# Patient Record
Sex: Male | Born: 1990 | Race: Black or African American | Hispanic: No | Marital: Single | Smoking: Current every day smoker
Health system: Southern US, Community
[De-identification: ages and names within clinical notes are randomized; demographics above are authoritative.]

## PROBLEM LIST (undated history)

## (undated) DIAGNOSIS — J45909 Unspecified asthma, uncomplicated: Secondary | ICD-10-CM

## (undated) DIAGNOSIS — F329 Major depressive disorder, single episode, unspecified: Secondary | ICD-10-CM

## (undated) DIAGNOSIS — F209 Schizophrenia, unspecified: Secondary | ICD-10-CM

## (undated) DIAGNOSIS — Z1589 Genetic susceptibility to other disease: Secondary | ICD-10-CM

## (undated) DIAGNOSIS — F32A Depression, unspecified: Secondary | ICD-10-CM

## (undated) DIAGNOSIS — F319 Bipolar disorder, unspecified: Secondary | ICD-10-CM

---
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
## 2011-04-21 DIAGNOSIS — F1220 Cannabis dependence, uncomplicated: Secondary | ICD-10-CM | POA: Diagnosis present

## 2011-04-21 DIAGNOSIS — F17200 Nicotine dependence, unspecified, uncomplicated: Secondary | ICD-10-CM | POA: Diagnosis present

## 2018-08-02 ENCOUNTER — Encounter: Payer: Self-pay | Admitting: *Deleted

## 2018-08-02 ENCOUNTER — Other Ambulatory Visit: Payer: Self-pay

## 2018-08-02 ENCOUNTER — Emergency Department
Admission: EM | Admit: 2018-08-02 | Discharge: 2018-08-03 | Disposition: A | Discharge status: Other Acute Inpatient Hospital | Payer: Medicare PPO | Attending: Emergency Medicine | Admitting: Emergency Medicine

## 2018-08-02 DIAGNOSIS — Z03818 Encounter for observation for suspected exposure to other biological agents ruled out: Secondary | ICD-10-CM | POA: Insufficient documentation

## 2018-08-02 DIAGNOSIS — F919 Conduct disorder, unspecified: Secondary | ICD-10-CM | POA: Diagnosis present

## 2018-08-02 DIAGNOSIS — J45909 Unspecified asthma, uncomplicated: Secondary | ICD-10-CM | POA: Insufficient documentation

## 2018-08-02 DIAGNOSIS — F201 Disorganized schizophrenia: Secondary | ICD-10-CM | POA: Diagnosis present

## 2018-08-02 DIAGNOSIS — R45850 Homicidal ideations: Secondary | ICD-10-CM | POA: Insufficient documentation

## 2018-08-02 DIAGNOSIS — Z046 Encounter for general psychiatric examination, requested by authority: Secondary | ICD-10-CM | POA: Insufficient documentation

## 2018-08-02 DIAGNOSIS — R440 Auditory hallucinations: Secondary | ICD-10-CM | POA: Diagnosis not present

## 2018-08-02 DIAGNOSIS — R45851 Suicidal ideations: Secondary | ICD-10-CM | POA: Insufficient documentation

## 2018-08-02 DIAGNOSIS — F209 Schizophrenia, unspecified: Secondary | ICD-10-CM | POA: Diagnosis not present

## 2018-08-02 HISTORY — DX: Bipolar disorder, unspecified: F31.9

## 2018-08-02 HISTORY — DX: Unspecified asthma, uncomplicated: J45.909

## 2018-08-02 HISTORY — DX: Genetic susceptibility to other disease: Z15.89

## 2018-08-02 HISTORY — DX: Depression, unspecified: F32.A

## 2018-08-02 LAB — CBC
HCT: 43.8 % (ref 39.0–52.0)
Hemoglobin: 14.6 g/dL (ref 13.0–17.0)
MCH: 29.2 pg (ref 26.0–34.0)
MCHC: 33.3 g/dL (ref 30.0–36.0)
MCV: 87.6 fL (ref 80.0–100.0)
Platelets: 382 10*3/uL (ref 150–400)
RBC: 5 MIL/uL (ref 4.22–5.81)
RDW: 12.5 % (ref 11.5–15.5)
WBC: 13.1 10*3/uL — ABNORMAL HIGH (ref 4.0–10.5)
nRBC: 0 % (ref 0.0–0.2)

## 2018-08-02 LAB — ETHANOL: Alcohol, Ethyl (B): <10 mg/dL (ref <10)

## 2018-08-02 LAB — COMPREHENSIVE METABOLIC PANEL
ALT: 13 U/L (ref 0–44)
AST: 19 U/L (ref 15–41)
Albumin: 4.5 g/dL (ref 3.5–5.0)
Alkaline Phosphatase: 68 U/L (ref 38–126)
Anion gap: 13 (ref 5–15)
BUN: 9 mg/dL (ref 6–20)
CO2: 23 mmol/L (ref 22–32)
Calcium: 9 mg/dL (ref 8.9–10.3)
Chloride: 99 mmol/L (ref 98–111)
Creatinine, Ser: 0.7 mg/dL (ref 0.61–1.24)
GFR calc Af Amer: >60 mL/min (ref >60)
GFR calc non Af Amer: >60 mL/min (ref >60)
Glucose, Bld: 95 mg/dL (ref 70–99)
Potassium: 3.5 mmol/L (ref 3.5–5.1)
Sodium: 135 mmol/L (ref 135–145)
Total Bilirubin: 0.7 mg/dL (ref 0.3–1.2)
Total Protein: 7.7 g/dL (ref 6.5–8.1)

## 2018-08-02 LAB — URINE DRUG SCREEN, QUALITATIVE (ARMC ONLY)
Amphetamines, Ur Screen: NOT DETECTED
Barbiturates, Ur Screen: NOT DETECTED
Benzodiazepine, Ur Scrn: NOT DETECTED
Cannabinoid 50 Ng, Ur ~~LOC~~: POSITIVE — AB
Cocaine Metabolite,Ur ~~LOC~~: NOT DETECTED
MDMA (Ecstasy)Ur Screen: NOT DETECTED
Methadone Scn, Ur: NOT DETECTED
Opiate, Ur Screen: NOT DETECTED
Phencyclidine (PCP) Ur S: NOT DETECTED
Tricyclic, Ur Screen: NOT DETECTED

## 2018-08-02 LAB — ACETAMINOPHEN LEVEL: Acetaminophen (Tylenol), Serum: <10 ug/mL — ABNORMAL LOW (ref 10–30)

## 2018-08-02 LAB — SALICYLATE LEVEL: Salicylate Lvl: <7 mg/dL (ref 2.8–30.0)

## 2018-08-02 LAB — SARS CORONAVIRUS 2 BY RT PCR (HOSPITAL ORDER, PERFORMED IN ~~LOC~~ HOSPITAL LAB): SARS Coronavirus 2: NEGATIVE

## 2018-08-02 MED ORDER — ACETAMINOPHEN 325 MG PO TABS
650.0000 mg | ORAL_TABLET | Freq: Once | ORAL | Status: AC
  Administered 2018-08-02: 650 mg via ORAL
  Filled 2018-08-02: qty 2

## 2018-08-02 NOTE — ED Provider Notes (Signed)
[facility] Emergency Department Provider Note  ____________________________________________   I have reviewed the triage vital signs and the nursing notes.   HISTORY  Chief Complaint Behavior Problem   History limited by and level 5 caveat due to: mental illness   HPI Kenneth Jensen is a 28 y.o. male who presents to the emergency department today because of concern for schizophrenia. He has a hard time describing exactly what has been going on with him recently. He does state that he feels he is not getting the help he needs at home. The patient says that he feels like he needs help. Does say that he gets shots for his schizophrenia. He denies any recent medical complaints.   Records reviewed. Per medical record review patient has a history of mental illness.   Past Medical History:  Diagnosis Date  . Asthma   . Bipolar 1 disorder (HCC)   . Depression   . Schizotaxia     There are no active problems to display for this patient.   History reviewed. No pertinent surgical history.  Prior to Admission medications   Not on File    Allergies Patient has no allergy information on record.  History reviewed. No pertinent family history.  Social History Social History   Tobacco Use  . Smoking status: Never Smoker  . Smokeless tobacco: Never Used  Substance Use Topics  . Alcohol use: Yes    Comment: socially  . Drug use: Yes    Types: Marijuana    Review of Systems Constitutional: No fever/chills Eyes: No visual changes. ENT: No sore throat. Cardiovascular: Denies chest pain. Respiratory: Denies shortness of breath. Gastrointestinal: No abdominal pain.  No nausea, no vomiting.  No diarrhea.   Genitourinary: Negative for dysuria. Musculoskeletal: Negative for back pain. Skin: Negative for rash. Neurological: Negative for headaches, focal weakness or numbness.  ____________________________________________   PHYSICAL EXAM:  VITAL  SIGNS: ED Triage Vitals  Enc Vitals Group     BP 08/02/18 1834 (!) 121/54     Pulse Rate 08/02/18 1834 94     Resp 08/02/18 1834 16     Temp 08/02/18 1834 98.3 F (36.8 C)     Temp Source 08/02/18 1834 Oral     SpO2 08/02/18 1834 98 %     Weight --      Height 08/02/18 1836 5\' 8"  (1.727 m)     Head Circumference --      Peak Flow --      Pain Score 08/02/18 1836 9   Constitutional: Alert and oriented.  Eyes: Conjunctivae are normal.  ENT      Head: Normocephalic and atraumatic.      Nose: No congestion/rhinnorhea.      Mouth/Throat: Mucous membranes are moist.      Neck: No stridor. Hematological/Lymphatic/Immunilogical: No cervical lymphadenopathy. Cardiovascular: Normal rate, regular rhythm.  No murmurs, rubs, or gallops.  Respiratory: Normal respiratory effort without tachypnea nor retractions. Breath sounds are clear and equal bilaterally. No wheezes/rales/rhonchi. Gastrointestinal: Soft and non tender. No rebound. No guarding.  Genitourinary: Deferred Musculoskeletal: Normal range of motion in all extremities. No lower extremity edema. Neurologic:  Normal speech and language. No gross focal neurologic deficits are appreciated.  Skin:  Skin is warm, dry and intact. No rash noted. Psychiatric: Mood and affect are normal. Speech and behavior are normal. Patient exhibits appropriate insight and judgment.  ____________________________________________    LABS (pertinent positives/negatives)  Ethanol <10 CMP wnl CBC wbc 13.1, hgb 14.6, plt  382 UDS cannabinoid positive Salicylate and acetaminophen below threshold ____________________________________________   EKG  None  ____________________________________________    RADIOLOGY  None  ____________________________________________   PROCEDURES  Procedures  ____________________________________________   INITIAL IMPRESSION / ASSESSMENT AND PLAN / ED COURSE  Pertinent labs & imaging results that were  available during my care of the patient were reviewed by me and considered in my medical decision making (see chart for details).   Patient with schizophrenia here in apparent attempt to seek help. Patient does not appear to be responding to any internal stimuli. Was evaluated by psychiatry who will plan on admission.  ___________________________________________   FINAL CLINICAL IMPRESSION(S) / ED DIAGNOSES  Final diagnoses:  Schizophrenia, unspecified type ([place])     Note: This dictation was prepared with Dragon dictation. Any transcriptional errors that result from this process are unintentional     [name], MD 08/02/18 2035

## 2018-08-02 NOTE — ED Notes (Signed)
Pt. Alert and oriented, warm and dry, in no distress. Pt. States he maybe have SI and would not answer completely when asked if he has a plan. Patient states he was having some HI but not now due to not being around those people. Patient is having AH with one voice telling him to be calm and the other to kill himself. Patient states he does not take medication but he smokes weed. Pt. Encouraged to let nursing staff know of any concerns or needs.

## 2018-08-02 NOTE — ED Triage Notes (Signed)
Pt with a hx of schizophrenia and reports he is hearing voices that are threatening him. Pt reporting SI with one attempt at walking out in front of traffic but he moved off the road just before the car came. Pt denies HI. Pt talking about his family but his comments are not making sense to this RN. Marijuana use but no other drug or alcohol use. Difficulty sleeping. Pt not clear at this time if he is complient with medications.

## 2018-08-02 NOTE — Consult Note (Signed)
[facility] Face-to-Face Psychiatry Consult   Reason for Consult: Auditory hallucinations Referring Physician: Dr. Derrill KayGoodman Patient Identification: Kenneth Jensen MRN:  [id number] Principal Diagnosis: <principal problem not specified> Diagnosis:  Active Problems:   Schizophrenia (HCC)   Total Time spent with patient: 45 minutes  Subjective: " Do you think when I leave here I can get my own apartment so I can live on my own?" Kenneth Jensen is a 28 y.o. male patient [facility] to [facility] ED for law enforcement voluntarily. The patient states "I came into the hospital because I need help for my schizophrenia.  The patient continued to disclose that he lives with his cousin but he feels the living condition is not right for him.Marland Kitchen.  He states, "my cousin kids are bad."  He continues to voice "can your help me to get into my own room apartment?" The patient currently is not taking any oral psychiatric medications.  He did voice that his medications comes from [facility] [place]. The address is [address], [place], [place] [address] and phone number is [phone]. This provider spoke to [place] in the pharmacy department and she stated she will have the pharmacy tech called [facility] in the morning to get the patient updated medication reconciliation list The patient endorses, receiving monthly injections from a doctor he is unsure of the doctors name.  He voice he gets a monthly Zyprexa injection, which it could be that he is receiving Zyprexa Relprevv. The patient was seen face-to-face by this provider; chart reviewed and consulted with Dr. Derrill KayGoodman on 08/02/2018 due to the care of the patient. It was discussed with the provider that the patient does hallucinating and meet criteria to be admitted to the psychiatric inpatient unit.  On evaluation the patient is alert and oriented x3, calm, cooperative, and mood is congruent with affect. The patient does admit to hearing voices and states  the voices "it tells me some things and sometimes other things." The patient does not appear to be responding to internal or external stimuli. The patient is presenting with some delusional thinking. The patient admits to auditory and visual hallucinations. The patient admits to having suicidal thoughts but denies homicidal, or self-harm ideations. The patient is presenting with psychotic behaviors. During an encounter with the patient, he was able to answer most questions appropriately. Collateral was obtained from the patient uncle Mr. Lonia Skinneratrick Celona [phone] who expresses concerns about the patient voicing he wants to live independently.  Mr. Francesco RunnerHolden expressed concerns stating that he is aware that the patient current living condition is not adequate at this time, but he is unable to have the patient come and live with him on the full-time basis.  He voiced, the patient is well come to spend weekends at his home.   Plan: The patient is a safety risk to self and does require psychiatric inpatient admission for stabilization and treatment.  HPI: Per Dr. Derrill KayGoodman: Kenneth Jensen is a 28 y.o. male who presents to the emergency department today because of concern for schizophrenia. He has a hard time describing exactly what has been going on with him recently. He does state that he feels he is not getting the help he needs at home. The patient says that he feels like he needs help. Does say that he gets shots for his schizophrenia. He denies any recent medical complaints.   Records reviewed. Per medical record review patient has a history of mental illness.   Past Psychiatric  History:  Bipolar 1 disorder (HCC) Depression Schizotaxia  Risk to Self:  Yes Risk to Others:  No Prior Inpatient Therapy:  Yes Prior Outpatient Therapy:  Yes  Past Medical History:  Past Medical History:  Diagnosis Date  . Asthma   . Bipolar 1 disorder (HCC)   . Depression   . Schizotaxia    History reviewed.  No pertinent surgical history. Family History: History reviewed. No pertinent family history. Family Psychiatric  History: Social History:  Social History   Substance and Sexual Activity  Alcohol Use Yes   Comment: socially     Social History   Substance and Sexual Activity  Drug Use Yes  . Types: Marijuana    Social History   Socioeconomic History  . Marital status: Unknown    Spouse name: Not on file  . Number of children: Not on file  . Years of education: Not on file  . Highest education level: Not on file  Occupational History  . Not on file  Social Needs  . Financial resource strain: Not on file  . Food insecurity    Worry: Not on file    Inability: Not on file  . Transportation needs    Medical: Not on file    Non-medical: Not on file  Tobacco Use  . Smoking status: Never Smoker  . Smokeless tobacco: Never Used  Substance and Sexual Activity  . Alcohol use: Yes    Comment: socially  . Drug use: Yes    Types: Marijuana  . Sexual activity: Not on file  Lifestyle  . Physical activity    Days per week: Not on file    Minutes per session: Not on file  . Stress: Not on file  Relationships  . Social Musicianconnections    Talks on phone: Not on file    Gets together: Not on file    Attends religious service: Not on file    Active member of club or organization: Not on file    Attends meetings of clubs or organizations: Not on file    Relationship status: Not on file  Other Topics Concern  . Not on file  Social History Narrative  . Not on file   Additional Social History:    Allergies:  Not on File  Labs:  Results for orders placed or performed during the hospital encounter of 08/02/18 (from the past 48 hour(s))  Comprehensive metabolic panel     Status: None   Collection Time: 08/02/18  6:44 PM  Result Value Ref Range   Sodium 135 135 - 145 mmol/L   Potassium 3.5 3.5 - 5.1 mmol/L   Chloride 99 98 - 111 mmol/L   CO2 23 22 - 32 mmol/L   Glucose, Bld 95  70 - 99 mg/dL   BUN 9 6 - 20 mg/dL   Creatinine, Ser 1.300.70 0.61 - 1.24 mg/dL   Calcium 9.0 8.9 - 86.510.3 mg/dL   Total Protein 7.7 6.5 - 8.1 g/dL   Albumin 4.5 3.5 - 5.0 g/dL   AST 19 15 - 41 U/L   ALT 13 0 - 44 U/L   Alkaline Phosphatase 68 38 - 126 U/L   Total Bilirubin 0.7 0.3 - 1.2 mg/dL   GFR calc non Af Amer >60 >60 mL/min   GFR calc Af Amer >60 >60 mL/min   Anion gap 13 5 - 15    Comment: Performed at [facility], [address] [place], [place] [address]  Ethanol  Status: None   Collection Time: 08/02/18  6:44 PM  Result Value Ref Range   Alcohol, Ethyl (B) <10 <10 mg/dL    Comment: (NOTE) Lowest detectable limit for serum alcohol is 10 mg/dL. For medical purposes only. Performed at [facility], [address] [place], [place] [address]   Salicylate level     Status: None   Collection Time: 08/02/18  6:44 PM  Result Value Ref Range   Salicylate Lvl <1.7 2.8 - 30.0 mg/dL    Comment: Performed at [facility], [address] [place], [place] [address]  Acetaminophen level     Status: Abnormal   Collection Time: 08/02/18  6:44 PM  Result Value Ref Range   Acetaminophen (Tylenol), Serum <10 (L) 10 - 30 ug/mL    Comment: (NOTE) Therapeutic concentrations vary significantly. A range of 10-30 ug/mL  may be an effective concentration for many patients. However, some  are best treated at concentrations outside of this range. Acetaminophen concentrations >150 ug/mL at 4 hours after ingestion  and >50 ug/mL at 12 hours after ingestion are often associated with  toxic reactions. Performed at [facility], [address] [place], [place] [address]   cbc     Status: Abnormal   Collection Time: 08/02/18  6:44 PM  Result Value Ref Range   WBC 13.1 (H) 4.0 - 10.5 K/uL   RBC 5.00 4.22 - 5.81 MIL/uL   Hemoglobin 14.6 13.0 - 17.0 g/dL   HCT 43.8 39.0 - 52.0 %   MCV 87.6 80.0 - 100.0 fL   MCH 29.2 26.0 - 34.0 pg   MCHC 33.3 30.0 -  36.0 g/dL   RDW 12.5 11.5 - 15.5 %   Platelets 382 150 - 400 K/uL   nRBC 0.0 0.0 - 0.2 %    Comment: Performed at [facility], [address], [place], [place] [address]  Urine Drug Screen, Qualitative     Status: Abnormal   Collection Time: 08/02/18  6:44 PM  Result Value Ref Range   Tricyclic, Ur Screen NONE DETECTED NONE DETECTED   Amphetamines, Ur Screen NONE DETECTED NONE DETECTED   MDMA (Ecstasy)Ur Screen NONE DETECTED NONE DETECTED   Cocaine Metabolite,Ur [place] NONE DETECTED NONE DETECTED   Opiate, Ur Screen NONE DETECTED NONE DETECTED   Phencyclidine (PCP) Ur S NONE DETECTED NONE DETECTED   Cannabinoid 50 Ng, Ur [place] POSITIVE (A) NONE DETECTED   Barbiturates, Ur Screen NONE DETECTED NONE DETECTED   Benzodiazepine, Ur Scrn NONE DETECTED NONE DETECTED   Methadone Scn, Ur NONE DETECTED NONE DETECTED    Comment: (NOTE) Tricyclics + metabolites, urine    Cutoff 1000 ng/mL Amphetamines + metabolites, urine  Cutoff 1000 ng/mL MDMA (Ecstasy), urine              Cutoff 500 ng/mL Cocaine Metabolite, urine          Cutoff 300 ng/mL Opiate + metabolites, urine        Cutoff 300 ng/mL Phencyclidine (PCP), urine         Cutoff 25 ng/mL Cannabinoid, urine                 Cutoff 50 ng/mL Barbiturates + metabolites, urine  Cutoff 200 ng/mL Benzodiazepine, urine              Cutoff 200 ng/mL Methadone, urine                   Cutoff 300 ng/mL The urine  drug screen provides only a preliminary, unconfirmed analytical test result and should not be used for non-medical purposes. Clinical consideration and professional judgment should be applied to any positive drug screen result due to possible interfering substances. A more specific alternate chemical method must be used in order to obtain a confirmed analytical result. Gas chromatography / mass spectrometry (GC/MS) is the preferred confirmat ory method. Performed at [facility], [address] [place], [place] [address]      No current facility-administered medications for this encounter.    No current outpatient medications on file.    Musculoskeletal: Strength & Muscle Tone: within normal limits Gait & Station: unsteady Patient leans: N/A  Psychiatric Specialty Exam: Physical Exam  Nursing note and vitals reviewed. Constitutional: He is oriented to person, place, and time. He appears well-developed and well-nourished.  HENT:  Head: Normocephalic.  Eyes: Pupils are equal, round, and reactive to light. Conjunctivae are normal.  Neck: Normal range of motion. Neck supple.  Respiratory: Effort normal.  Musculoskeletal: Normal range of motion.  Neurological: He is alert and oriented to person, place, and time.  Skin: Skin is warm and dry.    Review of Systems  Psychiatric/Behavioral: Positive for depression, hallucinations and suicidal ideas. The patient is nervous/anxious.   All other systems reviewed and are negative.   Blood pressure (!) 121/54, pulse 94, temperature 98.3 F (36.8 C), temperature source Oral, resp. rate 16, height 5\' 8"  (1.727 m), SpO2 98 %.There is no height or weight on file to calculate BMI.  General Appearance: Disheveled  Eye Contact:  Good  Speech:  Slow  Volume:  Decreased  Mood:  Anxious and Euphoric  Affect:  Congruent and Depressed  Thought Process:  Disorganized  Orientation:  Full (Time, Place, and Person)  Thought Content:  Illogical and Delusions  Suicidal Thoughts:  Yes.  without intent/plan  Homicidal Thoughts:  No  Memory:  Immediate;   Fair Recent;   Fair  Judgement:  Poor  Insight:  Lacking  Psychomotor Activity:  Decreased  Concentration:  Concentration: Fair and Attention Span: Good  Recall:  Good  Fund of Knowledge:  Fair  Language:  Fair  Akathisia:  Negative  Handed:  Right  AIMS (if indicated):     Assets:  Desire for Improvement Financial Resources/Insurance Housing Social Support  ADL's:  Intact  Cognition:  Impaired,  Mild  Sleep:    Okay     Treatment Plan Summary: Daily contact with patient to assess and evaluate symptoms and progress in treatment and Medication management  Disposition: Recommend psychiatric Inpatient admission when medically cleared. Supportive therapy provided about ongoing stressors.  Gillermo MurdochJacqueline Ameia Morency, NP 08/02/2018 10:27 PM

## 2018-08-03 ENCOUNTER — Inpatient Hospital Stay
Admission: AD | Admit: 2018-08-03 | Discharge: 2018-08-04 | DRG: 885 | Disposition: A | Discharge status: Home / Self Care | Payer: Medicare PPO | Source: Intra-hospital | Attending: Psychiatry | Admitting: Psychiatry

## 2018-08-03 DIAGNOSIS — Z1159 Encounter for screening for other viral diseases: Secondary | ICD-10-CM

## 2018-08-03 DIAGNOSIS — F201 Disorganized schizophrenia: Secondary | ICD-10-CM | POA: Diagnosis present

## 2018-08-03 DIAGNOSIS — F319 Bipolar disorder, unspecified: Secondary | ICD-10-CM | POA: Diagnosis present

## 2018-08-03 DIAGNOSIS — F203 Undifferentiated schizophrenia: Secondary | ICD-10-CM

## 2018-08-03 DIAGNOSIS — F209 Schizophrenia, unspecified: Secondary | ICD-10-CM | POA: Diagnosis present

## 2018-08-03 MED ORDER — MAGNESIUM HYDROXIDE 400 MG/5ML PO SUSP: 30.0000 mL | Freq: Every day | ORAL | Status: DC | PRN

## 2018-08-03 MED ORDER — DOCUSATE SODIUM 100 MG PO CAPS
100.0000 mg | ORAL_CAPSULE | Freq: Two times a day (BID) | ORAL | Status: DC
  Administered 2018-08-03 – 2018-08-04 (×2): 100 mg via ORAL
  Filled 2018-08-03 (×2): qty 1

## 2018-08-03 MED ORDER — ALUM & MAG HYDROXIDE-SIMETH 200-200-20 MG/5ML PO SUSP
30.0000 mL | ORAL | Status: DC | PRN
  Administered 2018-08-03: 10:00:00 30 mL via ORAL
  Filled 2018-08-03: qty 30

## 2018-08-03 MED ORDER — NICOTINE 21 MG/24HR TD PT24
21.0000 mg | MEDICATED_PATCH | Freq: Every day | TRANSDERMAL | Status: DC
  Administered 2018-08-03: 21 mg via TRANSDERMAL
  Filled 2018-08-03: qty 1

## 2018-08-03 MED ORDER — ACETAMINOPHEN 325 MG PO TABS: 650.0000 mg | ORAL_TABLET | Freq: Four times a day (QID) | ORAL | Status: DC | PRN

## 2018-08-03 NOTE — Tx Team (Signed)
Initial Treatment Plan 08/03/2018 1:30 AM Rolene Course LKT:[id number]    PATIENT STRESSORS: Financial difficulties Marital or family conflict Occupational concerns Substance abuse   PATIENT STRENGTHS: Capable of independent living Motivation for treatment/growth Religious Affiliation Supportive family/friends   PATIENT IDENTIFIED PROBLEMS: Depressions/Anxiety    Suicidal ideation    Illicit Drug use             DISCHARGE CRITERIA:  Improved stabilization in mood, thinking, and/or behavior Motivation to continue treatment in a less acute level of care Reduction of life-threatening or endangering symptoms to within safe limits Verbal commitment to aftercare and medication compliance  PRELIMINARY DISCHARGE PLAN: Attend 12-step recovery group Participate in family therapy Return to previous living arrangement Return to previous work or school arrangements  PATIENT/FAMILY INVOLVEMENT: This treatment plan has been presented to and reviewed with the patient, Kenneth Jensen,   The patient  have been given the opportunity to ask questions and make suggestions.  Clemens Catholic, RN 08/03/2018, 1:30 AM

## 2018-08-03 NOTE — Plan of Care (Signed)
D- Patient alert and oriented. Patient presents in a pleasant mood on assessment stating that he slept "somewhat ok" last night. Patient denies SI, HI, AVH, and pain at this time. Patient also denies anxiety, however, he endorses depression, rating it an "8/10", reporting that "I'm stuck in here and I just want to get well". Patient's goal for today is "expanding my mind".  A- Scheduled medications administered to patient, per MD orders. Support and encouragement provided.  Routine safety checks conducted every 15 minutes.  Patient informed to notify staff with problems or concerns.  R- No adverse drug reactions noted. Patient contracts for safety at this time. Patient compliant with medications and treatment plan. Patient receptive, calm, and cooperative. Patient interacts well with others on the unit.  Patient remains safe at this time.  Problem: Education: Goal: Utilization of techniques to improve thought processes will improve Outcome: Progressing Goal: Knowledge of the prescribed therapeutic regimen will improve Outcome: Progressing   Problem: Activity: Goal: Interest or engagement in leisure activities will improve Outcome: Progressing Goal: Imbalance in normal sleep/wake cycle will improve Outcome: Progressing   Problem: Coping: Goal: Coping ability will improve Outcome: Progressing Goal: Will verbalize feelings Outcome: Progressing   Problem: Health Behavior/Discharge Planning: Goal: Ability to make decisions will improve Outcome: Progressing Goal: Compliance with therapeutic regimen will improve Outcome: Progressing   Problem: Role Relationship: Goal: Will demonstrate positive changes in social behaviors and relationships Outcome: Progressing   Problem: Safety: Goal: Ability to disclose and discuss suicidal ideas will improve Outcome: Progressing Goal: Ability to identify and utilize support systems that promote safety will improve Outcome: Progressing   Problem:  Self-Concept: Goal: Will verbalize positive feelings about self Outcome: Progressing Goal: Level of anxiety will decrease Outcome: Progressing   Problem: Education: Goal: Ability to make informed decisions regarding treatment will improve Outcome: Progressing   Problem: Coping: Goal: Coping ability will improve Outcome: Progressing   Problem: Health Behavior/Discharge Planning: Goal: Identification of resources available to assist in meeting health care needs will improve Outcome: Progressing   Problem: Medication: Goal: Compliance with prescribed medication regimen will improve Outcome: Progressing   Problem: Self-Concept: Goal: Ability to disclose and discuss suicidal ideas will improve Outcome: Progressing Goal: Will verbalize positive feelings about self Outcome: Progressing   Problem: Education: Goal: Ability to state activities that reduce stress will improve Outcome: Progressing   Problem: Coping: Goal: Ability to identify and develop effective coping behavior will improve Outcome: Progressing   Problem: Self-Concept: Goal: Ability to identify factors that promote anxiety will improve Outcome: Progressing Goal: Level of anxiety will decrease Outcome: Progressing Goal: Ability to modify response to factors that promote anxiety will improve Outcome: Progressing   Problem: Education: Goal: Knowledge of disease or condition will improve Outcome: Progressing Goal: Understanding of discharge needs will improve Outcome: Progressing   Problem: Health Behavior/Discharge Planning: Goal: Ability to identify changes in lifestyle to reduce recurrence of condition will improve Outcome: Progressing Goal: Identification of resources available to assist in meeting health care needs will improve Outcome: Progressing   Problem: Physical Regulation: Goal: Complications related to the disease process, condition or treatment will be avoided or minimized Outcome:  Progressing   Problem: Safety: Goal: Ability to remain free from injury will improve Outcome: Progressing

## 2018-08-03 NOTE — [facility] Group Notes (Signed)

## 2018-08-03 NOTE — BH Assessment (Signed)
Assessment Note  Kenneth Jensen is an 28 y.o. male. Who present to the emergency department seeking psychiatric evaluation. He has a hard time describing exactly what has been going on with him recently. He does state that he feels he is not getting the help he needs at home. He states that he never feels heard. He reports regular THC used. Pt states that he was previously admit approximately 2-3 months ago. Pt reports med compliance but states that he stopped taking his mediation when he felt like he was better. He then state " felt that it might hurt me." Pt. denies any suicidal ideation, plan or intent at this time. Although he reports intermittent suicidal thoughts with the last occurs on this morning.  He reports previous suicide attempts with the most recent occurring approximately two years ago. Pt. denies the presence of any auditory or visual hallucinations at this time. Patient denies any other medical complaints.   Diagnosis: Schizophrenia disorder   Past Medical History:  Past Medical History:  Diagnosis Date  . Asthma   . Bipolar 1 disorder (HCC)   . Depression   . Schizotaxia     History reviewed. No pertinent surgical history.  Family History: History reviewed. No pertinent family history.  Social History:  reports that he has never smoked. He has never used smokeless tobacco. He reports current alcohol use. He reports current drug use. Drug: Marijuana.  Additional Social History:  Alcohol / Drug Use Pain Medications: SEE PTA Prescriptions: SEE PTA Over the Counter: SEE PTA History of alcohol / drug use?: Yes Longest period of sobriety (when/how long): " A day or so"  CIWA: CIWA-Ar BP: 109/62 Pulse Rate: 85 COWS:    Allergies: Not on File  Home Medications: (Not in a hospital admission)   OB/GYN Status:  No LMP for male patient.  General Assessment Data TTS Assessment: In system Is this a Tele or Face-to-Face Assessment?: Face-to-Face Is this an Initial  Assessment or a Re-assessment for this encounter?: Initial Assessment Patient Accompanied by:: N/A Language Other than English: No Living Arrangements: Other (Comment) What gender do you identify as?: Male Living Arrangements: Other relatives Can pt return to current living arrangement?: Yes Admission Status: Voluntary Is patient capable of signing voluntary admission?: Yes Referral Source: Self/Family/Friend Insurance type: [employer]   Medical Screening Exam [facility] Walk-in ONLY) Medical Exam completed: Yes  Crisis Care Plan Living Arrangements: Other relatives Legal Guardian: Other:(Unknown ) Name of Psychiatrist: [facility] [place] Name of Therapist: [facility] [place]   Education Status Is patient currently in school?: No Is the patient employed, unemployed or receiving disability?: Receiving disability income  Risk to self with the past 6 months Suicidal Ideation: No Has patient been a risk to self within the past 6 months prior to admission? : Yes Suicidal Intent: No Has patient had any suicidal intent within the past 6 months prior to admission? : No Is patient at risk for suicide?: No, but patient needs Medical Clearance Suicidal Plan?: No Has patient had any suicidal plan within the past 6 months prior to admission? : No Access to Means: No What has been your use of drugs/alcohol within the last 12 months?: THC Previous Attempts/Gestures: No How many times?: ("More than once") Other Self Harm Risks: none noted  Triggers for Past Attempts: Unpredictable Intentional Self Injurious Behavior: None Family Suicide History: Unknown Recent stressful life event(s): Other (Comment) Persecutory voices/beliefs?: (Pt denied) Depression: Yes Depression Symptoms: Insomnia, Isolating, Feeling worthless/self pity, Feeling angry/irritable, Loss of interest  in usual pleasures Substance abuse history and/or treatment for substance abuse?: Yes Suicide prevention information given to  non-admitted patients: Not applicable  Risk to Others within the past 6 months Homicidal Ideation: No Does patient have any lifetime risk of violence toward others beyond the six months prior to admission? : No Thoughts of Harm to Others: No Current Homicidal Intent: No Current Homicidal Plan: No Access to Homicidal Means: No Identified Victim: n History of harm to others?: No Assessment of Violence: None Noted Violent Behavior Description: n Criminal Charges Pending?: No Does patient have a court date: No Is patient on probation?: No  Psychosis Hallucinations: None noted Delusions: Unspecified, None noted  Mental Status Report Appearance/Hygiene: In scrubs Eye Contact: Fair Motor Activity: Freedom of movement Speech: Slow, Slurred Level of Consciousness: Alert Mood: Depressed Affect: Anxious, Depressed Anxiety Level: None Thought Processes: Coherent, Thought Blocking Judgement: Partial Orientation: Time, Place, Person, Situation Obsessive Compulsive Thoughts/Behaviors: None  Cognitive Functioning Concentration: Fair Memory: Recent Intact, Remote Intact Insight: Poor Impulse Control: Fair Appetite: Fair Have you had any weight changes? : No Change Sleep: No Change Total Hours of Sleep: 5 Vegetative Symptoms: None  ADLScreening [facility]) Patient's cognitive ability adequate to safely complete daily activities?: Yes Patient able to express need for assistance with ADLs?: Yes Independently performs ADLs?: Yes (appropriate for developmental age)  Prior Inpatient Therapy Prior Inpatient Therapy: Yes     ADL Screening (condition at time of admission) Patient's cognitive ability adequate to safely complete daily activities?: Yes Patient able to express need for assistance with ADLs?: Yes Independently performs ADLs?: Yes (appropriate for developmental age)       Abuse/Neglect Assessment (Assessment to be complete while patient is  alone) Abuse/Neglect Assessment Can Be Completed: Yes Physical Abuse: Denies Verbal Abuse: Denies Sexual Abuse: Denies Exploitation of patient/patient's resources: Denies Self-Neglect: Denies Values / Beliefs Cultural Requests During Hospitalization: None Spiritual Requests During Hospitalization: None Consults Spiritual Care Consult Needed: No Social Work Consult Needed: No Regulatory affairs officer (For Healthcare) Does Patient Have a Medical Advance Directive?: No Would patient like information on creating a medical advance directive?: No - Patient declined          Disposition:  Disposition Initial Assessment Completed for this Encounter: Yes  On Site Evaluation by:   Reviewed with Physician:    Laretta Alstrom 08/03/2018 1:49 AM
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
## 2018-08-03 NOTE — ED Provider Notes (Signed)
Patient is to be admitted to [facility] by Dr. Lynder Parents., NP.  Attending Physician will be Dr. Weber Cooks.   Patient has been assigned to room 311, by [facility] Charge Nurse [facility].   Intake Paper Work has been signed and placed on patient chart.  ER staff is aware of the admission:  ER Secretary    Dr.  ER MD   Joelene Millin Patient's Nurse   Patient Access.

## 2018-08-03 NOTE — [facility] Suicide Risk Assessment (Signed)
[place] INPATIENT:  Family/Significant Other Suicide Prevention Education  Suicide Prevention Education:  Patient Refusal for Family/Significant Other Suicide Prevention Education: The patient Kenneth Jensen has refused to provide written consent for family/significant other to be provided Family/Significant Other Suicide Prevention Education during admission and/or prior to discharge.  Physician notified.  SPE completed with pt, as pt refused to consent to family contact. SPI pamphlet provided to pt and pt was encouraged to share information with support network, ask questions, and talk about any concerns relating to SPE. Pt denies access to guns/firearms and verbalized understanding of information provided. Mobile Crisis information also provided to pt.    [place] MSW LCSW 08/03/2018, 10:24 AM
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
## 2018-08-03 NOTE — H&P (Signed)
Psychiatric Admission Assessment Adult  Patient Identification: Kenneth Jensen MRN:  [id number] Date of Evaluation:  08/03/2018 Chief Complaint:  Schizophrenia Principal Diagnosis: Schizophrenia (HCC) Diagnosis:  Principal Problem:   Schizophrenia (HCC)  History of Present Illness: Patient seen and chart reviewed.  Patient with a self-reported history of schizophrenia presented voluntarily to the emergency room saying that he needed some kind of help.  On interview today the patient is still vague about why he came to the hospital.  He says that he went to a pawn shop and asked them to call 911 for him.  He says that he did it because his family was talking down to him.  Patient denies any suicidal ideation or homicidal ideation.  Denies any hallucinations.  Denies physical symptoms.  Patient says that he has received psychiatric medication in the past from an act team in [place].  He does not know what medicine he has been on.  He thinks he got a shot about 2 weeks ago but does not know which one it was.  He denies recent alcohol or drug abuse although says that he will sometimes smoke weed or drink from time to time.  Patient is very vague with all of his answers. Associated Signs/Symptoms: Depression Symptoms:  None reported (Hypo) Manic Symptoms:  Hallucinations, Anxiety Symptoms:  None reported Psychotic Symptoms:  Hallucinations: Auditory PTSD Symptoms: Negative Total Time spent with patient: 1 hour  Past Psychiatric History: Patient reports he has had 1 prior hospitalization in [place].  He denies ever trying to kill himself in the past.  Says he has been aggressive one time in the past in [place] denies any other homicidal behavior.  Patient does not know any medications that he is ever been on in the past.  He does indicate that he had act team services in [place].  Sounds like it is relatively recent that he has been living with his cousin [place] in [place].  Is the patient at risk  to self? No.  Has the patient been a risk to self in the past 6 months? No.  Has the patient been a risk to self within the distant past? No.  Is the patient a risk to others? No.  Has the patient been a risk to others in the past 6 months? No.  Has the patient been a risk to others within the distant past? No.   Prior Inpatient Therapy:   Prior Outpatient Therapy:    Alcohol Screening: 1. How often do you have a drink containing alcohol?: 2 to 4 times a month 2. How many drinks containing alcohol do you have on a typical day when you are drinking?: 3 or 4 3. How often do you have six or more drinks on one occasion?: Less than monthly AUDIT-C Score: 4 4. How often during the last year have you found that you were not able to stop drinking once you had started?: Less than monthly 5. How often during the last year have you failed to do what was normally expected from you becasue of drinking?: Less than monthly 6. How often during the last year have you needed a first drink in the morning to get yourself going after a heavy drinking session?: Less than monthly 7. How often during the last year have you had a feeling of guilt of remorse after drinking?: Less than monthly 8. How often during the last year have you been unable to remember what happened the night before because you had been  drinking?: Less than monthly 9. Have you or someone else been injured as a result of your drinking?: No 10. Has a relative or friend or a doctor or another health worker been concerned about your drinking or suggested you cut down?: No Alcohol Use Disorder Identification Test Final Score (AUDIT): 9 Alcohol Brief Interventions/Follow-up: Alcohol Education Substance Abuse History in the last 12 months:  Yes.   Consequences of Substance Abuse: Negative Previous Psychotropic Medications: Yes  Psychological Evaluations: Yes  Past Medical History:  Past Medical History:  Diagnosis Date  . Asthma   . Bipolar 1  disorder (HCC)   . Depression   . Schizotaxia    History reviewed. No pertinent surgical history. Family History: History reviewed. No pertinent family history. Family Psychiatric  History: None known Tobacco Screening: Have you used any form of tobacco in the last 30 days? (Cigarettes, Smokeless Tobacco, Cigars, and/or Pipes): Yes Tobacco use, Select all that apply: 5 or more cigarettes per day Are you interested in Tobacco Cessation Medications?: Yes, will notify MD for an order Counseled patient on smoking cessation including recognizing danger situations, developing coping skills and basic information about quitting provided: Yes Social History:  Social History   Substance and Sexual Activity  Alcohol Use Yes   Comment: socially     Social History   Substance and Sexual Activity  Drug Use Yes  . Types: Marijuana    Additional Social History: Marital status: Single Does patient have children?: No                         Allergies:  Not on File Lab Results:  Results for orders placed or performed during the hospital encounter of 08/02/18 (from the past 48 hour(s))  Comprehensive metabolic panel     Status: None   Collection Time: 08/02/18  6:44 PM  Result Value Ref Range   Sodium 135 135 - 145 mmol/L   Potassium 3.5 3.5 - 5.1 mmol/L   Chloride 99 98 - 111 mmol/L   CO2 23 22 - 32 mmol/L   Glucose, Bld 95 70 - 99 mg/dL   BUN 9 6 - 20 mg/dL   Creatinine, Ser 1.610.70 0.61 - 1.24 mg/dL   Calcium 9.0 8.9 - 09.610.3 mg/dL   Total Protein 7.7 6.5 - 8.1 g/dL   Albumin 4.5 3.5 - 5.0 g/dL   AST 19 15 - 41 U/L   ALT 13 0 - 44 U/L   Alkaline Phosphatase 68 38 - 126 U/L   Total Bilirubin 0.7 0.3 - 1.2 mg/dL   GFR calc non Af Amer >60 >60 mL/min   GFR calc Af Amer >60 >60 mL/min   Anion gap 13 5 - 15    Comment: Performed at [facility], [address] [place], [place] [address]  Ethanol     Status: None   Collection Time: 08/02/18  6:44 PM  Result Value Ref  Range   Alcohol, Ethyl (B) <10 <10 mg/dL    Comment: (NOTE) Lowest detectable limit for serum alcohol is 10 mg/dL. For medical purposes only. Performed at [facility], [address] [place], [place] [address]   Salicylate level     Status: None   Collection Time: 08/02/18  6:44 PM  Result Value Ref Range   Salicylate Lvl <7.0 2.8 - 30.0 mg/dL    Comment: Performed at [facility], [address] [place], [place] [address]  Acetaminophen level  Status: Abnormal   Collection Time: 08/02/18  6:44 PM  Result Value Ref Range   Acetaminophen (Tylenol), Serum <10 (L) 10 - 30 ug/mL    Comment: (NOTE) Therapeutic concentrations vary significantly. A range of 10-30 ug/mL  may be an effective concentration for many patients. However, some  are best treated at concentrations outside of this range. Acetaminophen concentrations >150 ug/mL at 4 hours after ingestion  and >50 ug/mL at 12 hours after ingestion are often associated with  toxic reactions. Performed at [facility], [address] [place], [place] [address]   cbc     Status: Abnormal   Collection Time: 08/02/18  6:44 PM  Result Value Ref Range   WBC 13.1 (H) 4.0 - 10.5 K/uL   RBC 5.00 4.22 - 5.81 MIL/uL   Hemoglobin 14.6 13.0 - 17.0 g/dL   HCT 43.8 39.0 - 52.0 %   MCV 87.6 80.0 - 100.0 fL   MCH 29.2 26.0 - 34.0 pg   MCHC 33.3 30.0 - 36.0 g/dL   RDW 12.5 11.5 - 15.5 %   Platelets 382 150 - 400 K/uL   nRBC 0.0 0.0 - 0.2 %    Comment: Performed at [facility], [address], [place], [place] [address]  Urine Drug Screen, Qualitative     Status: Abnormal   Collection Time: 08/02/18  6:44 PM  Result Value Ref Range   Tricyclic, Ur Screen NONE DETECTED NONE DETECTED   Amphetamines, Ur Screen NONE DETECTED NONE DETECTED   MDMA (Ecstasy)Ur Screen NONE DETECTED NONE DETECTED   Cocaine Metabolite,Ur [place] NONE DETECTED NONE DETECTED   Opiate, Ur Screen NONE DETECTED NONE DETECTED    Phencyclidine (PCP) Ur S NONE DETECTED NONE DETECTED   Cannabinoid 50 Ng, Ur [place] POSITIVE (A) NONE DETECTED   Barbiturates, Ur Screen NONE DETECTED NONE DETECTED   Benzodiazepine, Ur Scrn NONE DETECTED NONE DETECTED   Methadone Scn, Ur NONE DETECTED NONE DETECTED    Comment: (NOTE) Tricyclics + metabolites, urine    Cutoff 1000 ng/mL Amphetamines + metabolites, urine  Cutoff 1000 ng/mL MDMA (Ecstasy), urine              Cutoff 500 ng/mL Cocaine Metabolite, urine          Cutoff 300 ng/mL Opiate + metabolites, urine        Cutoff 300 ng/mL Phencyclidine (PCP), urine         Cutoff 25 ng/mL Cannabinoid, urine                 Cutoff 50 ng/mL Barbiturates + metabolites, urine  Cutoff 200 ng/mL Benzodiazepine, urine              Cutoff 200 ng/mL Methadone, urine                   Cutoff 300 ng/mL The urine drug screen provides only a preliminary, unconfirmed analytical test result and should not be used for non-medical purposes. Clinical consideration and professional judgment should be applied to any positive drug screen result due to possible interfering substances. A more specific alternate chemical method must be used in order to obtain a confirmed analytical result. Gas chromatography / mass spectrometry (GC/MS) is the preferred confirmat ory method. Performed at [facility], [address] [place], [place] [address]   SARS Coronavirus 2 (CEPHEID- Performed in [facility] hospital lab), Hosp Order     Status: None   Collection Time: 08/02/18  9:44 PM  Specimen: Nasopharyngeal Swab  Result Value Ref Range   SARS Coronavirus 2 NEGATIVE NEGATIVE    Comment: (NOTE) If result is NEGATIVE SARS-CoV-2 target nucleic acids are NOT DETECTED. The SARS-CoV-2 RNA is generally detectable in upper and lower  respiratory specimens during the acute phase of infection. The lowest  concentration of SARS-CoV-2 viral copies this assay can detect is 250  copies / mL. A negative result  does not preclude SARS-CoV-2 infection  and should not be used as the sole basis for treatment or other  patient management decisions.  A negative result may occur with  improper specimen collection / handling, submission of specimen other  than nasopharyngeal swab, presence of viral mutation(s) within the  areas targeted by this assay, and inadequate number of viral copies  (<250 copies / mL). A negative result must be combined with clinical  observations, patient history, and epidemiological information. If result is POSITIVE SARS-CoV-2 target nucleic acids are DETECTED. The SARS-CoV-2 RNA is generally detectable in upper and lower  respiratory specimens dur ing the acute phase of infection.  Positive  results are indicative of active infection with SARS-CoV-2.  Clinical  correlation with patient history and other diagnostic information is  necessary to determine patient infection status.  Positive results do  not rule out bacterial infection or co-infection with other viruses. If result is PRESUMPTIVE POSTIVE SARS-CoV-2 nucleic acids MAY BE PRESENT.   A presumptive positive result was obtained on the submitted specimen  and confirmed on repeat testing.  While 2019 novel coronavirus  (SARS-CoV-2) nucleic acids may be present in the submitted sample  additional confirmatory testing may be necessary for epidemiological  and / or clinical management purposes  to differentiate between  SARS-CoV-2 and other Sarbecovirus currently known to infect humans.  If clinically indicated additional testing with an alternate test  methodology [phone]) is advised. The SARS-CoV-2 RNA is generally  detectable in upper and lower respiratory sp ecimens during the acute  phase of infection. The expected result is Negative. Fact Sheet for Patients:  BoilerBrush.com.cy Fact Sheet for Healthcare Providers: https://pope.com/ This test is not yet approved or  cleared by the [place] FDA and has been authorized for detection and/or diagnosis of SARS-CoV-2 by FDA under an Emergency Use Authorization (EUA).  This EUA will remain in effect (meaning this test can be used) for the duration of the COVID-19 declaration under Section 564(b)(1) of the Act, 21 U.S.C. section 360bbb-3(b)(1), unless the authorization is terminated or revoked sooner. Performed at [facility], [address] [place], [place] [address]     Blood Alcohol level:  Lab Results  Component Value Date   [facility] <10 08/02/2018    Metabolic Disorder Labs:  No results found for: HGBA1C, MPG No results found for: PROLACTIN No results found for: CHOL, TRIG, HDL, CHOLHDL, VLDL, LDLCALC  Current Medications: Current Facility-Administered Medications  Medication Dose Route Frequency Provider Last Rate Last Dose  . acetaminophen (TYLENOL) tablet 650 mg  650 mg Oral Q6H PRN Gillermo Murdoch, NP      . alum & mag hydroxide-simeth (MAALOX/MYLANTA) 200-200-20 MG/5ML suspension 30 mL  30 mL Oral Q4H PRN Gillermo Murdoch, NP   30 mL at 08/03/18 1017  . docusate sodium (COLACE) capsule 100 mg  100 mg Oral BID Elba Schaber, Jackquline Denmark, MD   100 mg at 08/03/18 1629  . magnesium hydroxide (MILK OF MAGNESIA) suspension 30 mL  30 mL Oral Daily PRN Gillermo Murdoch, NP      . nicotine (  NICODERM CQ - dosed in mg/24 hours) patch 21 mg  21 mg Transdermal Daily Joselynne Killam T, MD       PTA Medications: No medications prior to admission.    Musculoskeletal: Strength & Muscle Tone: within normal limits Gait & Station: normal Patient leans: N/A  Psychiatric Specialty Exam: Physical Exam  Nursing note and vitals reviewed. Constitutional: He appears well-developed and well-nourished.  HENT:  Head: Normocephalic and atraumatic.  Eyes: Pupils are equal, round, and reactive to light. Conjunctivae are normal.  Neck: Normal range of motion.  Cardiovascular: Regular rhythm and  normal heart sounds.  Respiratory: Effort normal. No respiratory distress.  GI: Soft.  Musculoskeletal: Normal range of motion.  Neurological: He is alert.  Skin: Skin is warm and dry.  Psychiatric: Thought content normal. His affect is blunt. His speech is delayed. He is slowed. Cognition and memory are impaired. He expresses impulsivity.    Review of Systems  Constitutional: Negative.   HENT: Negative.   Eyes: Negative.   Respiratory: Negative.   Cardiovascular: Negative.   Gastrointestinal: Negative.   Musculoskeletal: Negative.   Skin: Negative.   Neurological: Negative.   Psychiatric/Behavioral: Negative.     Blood pressure 123/75, pulse 96, temperature 97.8 F (36.6 C), temperature source Oral, resp. rate 18, height 5\' 7"  (1.702 m), weight 83.5 kg, SpO2 100 %.Body mass index is 28.82 kg/m.  General Appearance: Casual  Eye Contact:  Fair  Speech:  Slow  Volume:  Decreased  Mood:  Euthymic  Affect:  Congruent  Thought Process:  Disorganized  Orientation:  Full (Time, Place, and Person)  Thought Content:  Illogical, Rumination and Tangential  Suicidal Thoughts:  No  Homicidal Thoughts:  No  Memory:  Immediate;   Fair Recent;   Fair Remote;   Poor  Judgement:  Impaired  Insight:  Shallow  Psychomotor Activity:  Normal  Concentration:  Concentration: Poor  Recall:  Poor  Fund of Knowledge:  Poor  Language:  Fair  Akathisia:  No  Handed:  Right  AIMS (if indicated):     Assets:  Desire for Improvement  ADL's:  Impaired  Cognition:  Impaired,  Mild  Sleep:  Number of Hours: 4    Treatment Plan Summary: Daily contact with patient to assess and evaluate symptoms and progress in treatment, Medication management and Plan Patient with a self-reported history of schizophrenia.  Appears to have some degree of cognitive impairment.  Right now no evidence of acute dangerousness.  Does not meet commitment criteria.  No indication that he needs inpatient hospitalization.   Given that we do not know what medicines he has been on or is taking there seems to be no specific reason to get him on a new antipsychotic medication.  Patient will be discharged tomorrow most likely.  Try to get some collateral information with no response so far.  Observation Level/Precautions:  15 minute checks  Laboratory:  Chemistry Profile  Psychotherapy:    Medications:    Consultations:    Discharge Concerns:    Estimated LOS:  Other:     Physician Treatment Plan for Primary Diagnosis: Schizophrenia (HCC) Long Term Goal(s): Improvement in symptoms so as ready for discharge  Short Term Goals: Ability to demonstrate self-control will improve  Physician Treatment Plan for Secondary Diagnosis: Principal Problem:   Schizophrenia (HCC)  Long Term Goal(s): Improvement in symptoms so as ready for discharge  Short Term Goals: Ability to maintain clinical measurements within normal limits will improve  I certify that  inpatient services furnished can reasonably be expected to improve the patient's condition.    Mordecai Rasmussen, MD 7/28/20206:44 PM

## 2018-08-03 NOTE — BHH Suicide Risk Assessment (Signed)
[facility] Admission Suicide Risk Assessment   Nursing information obtained from:  Patient Demographic factors:  NA Current Mental Status:  NA Loss Factors:  NA Historical Factors:  NA Risk Reduction Factors:  Positive therapeutic relationship  Total Time spent with patient: 1 hour Principal Problem: <principal problem not specified> Diagnosis:  Active Problems:   Schizophrenia ([place])  Subjective Data: Patient seen chart reviewed.  Patient reportedly with history of schizophrenia who presented voluntarily to the emergency room with vague complaints.  On interview today the patient denies any suicidal or homicidal thoughts.  Denies having hallucinations.  Denies any acute symptoms of depression.  Wants to get back home at the earliest opportunity.  Continued Clinical Symptoms:  Alcohol Use Disorder Identification Test Final Score (AUDIT): 9 The "Alcohol Use Disorders Identification Test", Guidelines for Use in Primary Care, Second Edition.  World [facility]). Score between 0-7:  no or low risk or alcohol related problems. Score between 8-15:  moderate risk of alcohol related problems. Score between 16-19:  high risk of alcohol related problems. Score 20 or above:  warrants further diagnostic evaluation for alcohol dependence and treatment.   CLINICAL FACTORS:   Schizophrenia:   Paranoid or undifferentiated type   Musculoskeletal: Strength & Muscle Tone: within normal limits Gait & Station: normal Patient leans: N/A  Psychiatric Specialty Exam: Physical Exam  Nursing note and vitals reviewed. Constitutional: He appears well-developed and well-nourished.  HENT:  Head: Normocephalic and atraumatic.  Eyes: Pupils are equal, round, and reactive to light. Conjunctivae are normal.  Neck: Normal range of motion.  Cardiovascular: Regular rhythm and normal heart sounds.  Respiratory: Effort normal. No respiratory distress.  GI: Soft.  Musculoskeletal: Normal range of motion.   Neurological: He is alert.  Skin: Skin is warm and dry.  Psychiatric: Thought content normal. His affect is blunt. His speech is delayed. He is slowed. Cognition and memory are impaired. He expresses impulsivity. He exhibits abnormal recent memory.    Review of Systems  Constitutional: Negative.   HENT: Negative.   Eyes: Negative.   Respiratory: Negative.   Cardiovascular: Negative.   Gastrointestinal: Negative.   Musculoskeletal: Negative.   Skin: Negative.   Neurological: Negative.   Psychiatric/Behavioral: Negative.     Blood pressure 123/75, pulse 96, temperature 97.8 F (36.6 C), temperature source Oral, resp. rate 18, height 5\' 7"  (1.702 m), weight 83.5 kg, SpO2 100 %.Body mass index is 28.82 kg/m.  General Appearance: Casual  Eye Contact:  Good  Speech:  Slow  Volume:  Decreased  Mood:  Euthymic  Affect:  Constricted  Thought Process:  Disorganized  Orientation:  Full (Time, Place, and Person)  Thought Content:  Illogical  Suicidal Thoughts:  No  Homicidal Thoughts:  No  Memory:  Immediate;   Fair Recent;   Fair Remote;   Fair  Judgement:  Fair  Insight:  Shallow  Psychomotor Activity:  Decreased  Concentration:  Concentration: Fair  Recall:  [employer] of Knowledge:  Fair  Language:  Fair  Akathisia:  No  Handed:  Right  AIMS (if indicated):     Assets:  Desire for Improvement  ADL's:  Impaired  Cognition:  Impaired,  Mild  Sleep:  Number of Hours: 4      COGNITIVE FEATURES THAT CONTRIBUTE TO RISK:  Loss of executive function    SUICIDE RISK:   Minimal: No identifiable suicidal ideation.  Patients presenting with no risk factors but with morbid ruminations; may be classified as minimal risk based  on the severity of the depressive symptoms  PLAN OF CARE: Patient will be treated symptomatically.  Attempt to get collateral information if possible.  Review labs.  Work on likely discharge within the next day back to outpatient treatment  I certify that  inpatient services furnished can reasonably be expected to improve the patient's condition.   Mordecai RasmussenJohn , MD 08/03/2018, 6:42 PM

## 2018-08-03 NOTE — Progress Notes (Signed)
Recreation Therapy Notes    Date: 08/03/2018  Time: 9:30 am  Location: Craft room  Behavioral response: Appropriate   Intervention Topic: Problem Solving   Discussion/Intervention:  Group content on today was focused on problem solving. The group described what problem solving is. Patients expressed how problems affect them and how they deal with problems. Individuals identified healthy ways to deal with problems. Patients explained what normally happens to them when they do not deal with problems. The group expressed reoccurring problems for them. The group participated in the intervention "Ways to Solve problems" where patients were given a chance to explore different ways to solve problems.  Clinical Observations/Feedback:  Patient came to group and defined problem solving as counting to 10. He then got off topic asking when he could go home and then he began to explain  that his stomach was hurting. Individual left group and did not return.  Angeliki Mates LRT/CTRS         Storey Stangeland 08/03/2018 11:26 AM

## 2018-08-03 NOTE — Progress Notes (Signed)
Recreation Therapy Notes  INPATIENT RECREATION THERAPY ASSESSMENT  Patient Details Name: Kenneth Jensen MRN: [id number] DOB: [birth year] Today's Date: 08/03/2018       Information Obtained From: Patient  Able to Participate in Assessment/Interview: Yes  Patient Presentation: Responsive  Reason for Admission (Per Patient): Active Symptoms  Patient Stressors:    Coping Skills:   Music, Prayer  Leisure Interests (2+):  (Just Relaxing)  Frequency of Recreation/Participation: Monthly  Awareness of Community Resources:  Yes  Community Resources:  Other (Comment)([facility])  Current Use:    If no, Barriers?:    Expressed Interest in [employer] Information:    [place] of Residence:  [place]  Patient Main Form of Transportation: Taxi  Patient Strengths:  N/A  Patient Identified Areas of Improvement:  N/A  Patient Goal for Hospitalization:  Getting home  Current SI (including self-harm):  No  Current HI:  No  Current AVH: No  Staff Intervention Plan: Group Attendance, Collaborate with Interdisciplinary Treatment Team  Consent to Intern Participation: N/A  Kenneth Jensen 08/03/2018, 3:29 PM

## 2018-08-03 NOTE — ED Notes (Signed)
Patient accepted to [facility] room 311
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
## 2018-08-04 NOTE — Plan of Care (Signed)
Patient is visible in the milieu, pleasant and cooperative. Alert and oriented and denying thoughts of self harm. Denying hallucinations. Expressing readiness for discharge. Presented to the medication room for his AM medications. Patient stated he did not need his nicotine patch "I am going home anyway". Patient has no concern. Safety precautions maintained.

## 2018-08-04 NOTE — Discharge Summary (Signed)
Physician Discharge Summary Note  Patient:  Kenneth Jensen is an 28 y.o., male MRN:  [id number] DOB:  [birth year] Patient phone:  [phone] (home)  Patient address:   [place] [address],  Total Time spent with patient: 45 minutes  Date of Admission:  08/03/2018 Date of Discharge: August 04, 2018  Reason for Admission: Admitted through the emergency room where he presented with vague complaints and a history of schizophrenia  Principal Problem: Schizophrenia [facility]) Discharge Diagnoses: Principal Problem:   Schizophrenia [facility])   Past Psychiatric History: History of schizophrenia  Past Medical History:  Past Medical History:  Diagnosis Date  . Asthma   . Bipolar 1 disorder ([place])   . Depression   . Schizotaxia    History reviewed. No pertinent surgical history. Family History: History reviewed. No pertinent family history. Family Psychiatric  History: See previous Social History:  Social History   Substance and Sexual Activity  Alcohol Use Yes   Comment: socially     Social History   Substance and Sexual Activity  Drug Use Yes  . Types: Marijuana    Social History   Socioeconomic History  . Marital status: Unknown    Spouse name: Not on file  . Number of children: Not on file  . Years of education: Not on file  . Highest education level: Not on file  Occupational History  . Not on file  Social Needs  . Financial resource strain: Not on file  . Food insecurity    Worry: Not on file    Inability: Not on file  . Transportation needs    Medical: Not on file    Non-medical: Not on file  Tobacco Use  . Smoking status: Never Smoker  . Smokeless tobacco: Never Used  Substance and Sexual Activity  . Alcohol use: Yes    Comment: socially  . Drug use: Yes    Types: Marijuana  . Sexual activity: Not on file  Lifestyle  . Physical activity    Days per week: Not on file    Minutes per session: Not on file  . Stress: Not on file  Relationships  .  Social Herbalist on phone: Not on file    Gets together: Not on file    Attends religious service: Not on file    Active member of club or organization: Not on file    Attends meetings of clubs or organizations: Not on file    Relationship status: Not on file  Other Topics Concern  . Not on file  Social History Narrative  . Not on file    Hospital Course: Patient did not display any dangerous or aggressive behavior in the hospital.  Did not show overt psychotic symptoms.  Claims that he had been given a long-acting injectable shot relatively recently.  Not able to really clearly provide information about previous medicine.  I attempted to contact his most recent provider without success.  Patient no longer met commitment criteria.  He was discharged without any new prescriptions but with referral to local follow-up at his request.  Patient will be staying back with his cousin locally.  No sign of dangerousness at discharge  Physical Findings: AIMS: Facial and Oral Movements Muscles of Facial Expression: None, normal Lips and Perioral Area: None, normal Jaw: None, normal Tongue: None, normal,Extremity Movements Upper (arms, wrists, hands, fingers): None, normal Lower (legs, knees, ankles, toes): None, normal, Trunk Movements Neck, shoulders, hips: None, normal, Overall Severity Severity of abnormal  movements (highest score from questions above): None, normal Incapacitation due to abnormal movements: None, normal Patient's awareness of abnormal movements (rate only patient's report): No Awareness, Dental Status Current problems with teeth and/or dentures?: No Does patient usually wear dentures?: No  CIWA:  CIWA-Ar Total: 2 COWS:  COWS Total Score: 2  Musculoskeletal: Strength & Muscle Tone: within normal limits Gait & Station: normal Patient leans: N/A  Psychiatric Specialty Exam: Physical Exam  Nursing note and vitals reviewed. Constitutional: He appears  well-developed and well-nourished.  HENT:  Head: Normocephalic and atraumatic.  Eyes: Pupils are equal, round, and reactive to light. Conjunctivae are normal.  Neck: Normal range of motion.  Cardiovascular: Regular rhythm and normal heart sounds.  Respiratory: Effort normal.  GI: Soft.  Musculoskeletal: Normal range of motion.  Neurological: He is alert.  Skin: Skin is warm and dry.  Psychiatric: He has a normal mood and affect. His speech is normal and behavior is normal. Judgment and thought content normal. Cognition and memory are normal.    Review of Systems  Constitutional: Negative.   HENT: Negative.   Eyes: Negative.   Respiratory: Negative.   Cardiovascular: Negative.   Gastrointestinal: Negative.   Musculoskeletal: Negative.   Skin: Negative.   Neurological: Negative.   Psychiatric/Behavioral: Negative.     Blood pressure (!) 136/119, pulse 99, temperature 98.3 F (36.8 C), temperature source Oral, resp. rate 18, height '5\' 7"'$  (1.702 m), weight 83.5 kg, SpO2 100 %.Body mass index is 28.82 kg/m.  General Appearance: Casual  Eye Contact:  Good  Speech:  Clear and Coherent  Volume:  Normal  Mood:  Euthymic  Affect:  Constricted  Thought Process:  Coherent  Orientation:  Full (Time, Place, and Person)  Thought Content:  Logical  Suicidal Thoughts:  No  Homicidal Thoughts:  No  Memory:  Immediate;   Fair Recent;   Fair Remote;   Fair  Judgement:  Fair  Insight:  Fair  Psychomotor Activity:  Normal  Concentration:  Concentration: Fair  Recall:  [employer] of Knowledge:  Fair  Language:  Fair  Akathisia:  No  Handed:  Right  AIMS (if indicated):     Assets:  Desire for Improvement  ADL's:  Intact  Cognition:  WNL  Sleep:  Number of Hours: 7.5     Have you used any form of tobacco in the last 30 days? (Cigarettes, Smokeless Tobacco, Cigars, and/or Pipes): Yes  Has this patient used any form of tobacco in the last 30 days? (Cigarettes, Smokeless Tobacco,  Cigars, and/or Pipes) Yes, Yes, A prescription for an FDA-approved tobacco cessation medication was offered at discharge and the patient refused  Blood Alcohol level:  Lab Results  Component Value Date   ETH <10 18/84/1660    Metabolic Disorder Labs:  No results found for: HGBA1C, MPG No results found for: PROLACTIN No results found for: CHOL, TRIG, HDL, CHOLHDL, VLDL, LDLCALC  See Psychiatric Specialty Exam and Suicide Risk Assessment completed by Attending Physician prior to discharge.  Discharge destination:  Home  Is patient on multiple antipsychotic therapies at discharge:  No   Has Patient had three or more failed trials of antipsychotic monotherapy by history:  No  Recommended Plan for Multiple Antipsychotic Therapies: NA  Discharge Instructions    Diet - low sodium heart healthy   Complete by: As directed    Increase activity slowly   Complete by: As directed      Allergies as of 08/04/2018   Not on  File     Medication List    You have not been prescribed any medications.    Follow-up Information    [facility] Follow up on 08/09/2018.   Why: Please attend your follow up appointment on 08/09/2018 at 9:30. Please bring insurance card and ID.  This meeting will be face to face. Contact information: [place] [address] [phone]           Follow-up recommendations:  Activity:  Activity as tolerated Diet:  Regular diet Other:  Follow-up with [facility]  Comments: Patient was not given prescriptions at discharge.  Not clear what medicines he has been taking but was not actively symptomatic and no new prescriptions were required  Signed: Alethia Berthold, MD 08/04/2018, 6:28 PM

## 2018-08-04 NOTE — BHH Suicide Risk Assessment (Signed)
[facility] Discharge Suicide Risk Assessment   Principal Problem: Schizophrenia [facility]) Discharge Diagnoses: Principal Problem:   Schizophrenia ([place])   Total Time spent with patient: 45 minutes  Musculoskeletal: Strength & Muscle Tone: within normal limits Gait & Station: normal Patient leans: N/A  Psychiatric Specialty Exam: Review of Systems  Constitutional: Negative.   HENT: Negative.   Eyes: Negative.   Respiratory: Negative.   Cardiovascular: Negative.   Gastrointestinal: Negative.   Musculoskeletal: Negative.   Skin: Negative.   Neurological: Negative.   Psychiatric/Behavioral: Negative.     Blood pressure (!) 136/119, pulse 99, temperature 98.3 F (36.8 C), temperature source Oral, resp. rate 18, height 5\' 7"  (1.702 m), weight 83.5 kg, SpO2 100 %.Body mass index is 28.82 kg/m.  General Appearance: Casual  Eye Contact::  Good  Speech:  Normal Rate409  Volume:  Normal  Mood:  Euthymic  Affect:  Congruent  Thought Process:  Coherent  Orientation:  Full (Time, Place, and Person)  Thought Content:  Logical  Suicidal Thoughts:  No  Homicidal Thoughts:  No  Memory:  Immediate;   Fair Recent;   Fair Remote;   Fair  Judgement:  Fair  Insight:  Fair  Psychomotor Activity:  Normal  Concentration:  Fair  Recall:  [employer] of [place]  Language: Fair  Akathisia:  No  Handed:  Right  AIMS (if indicated):     Assets:  Desire for Improvement Housing Physical Health Resilience Social Support  Sleep:  Number of Hours: 7.5  Cognition: WNL  ADL's:  Intact   Mental Status Per Nursing Assessment::   On Admission:  NA  Demographic Factors:  Male  Loss Factors: NA  Historical Factors: Impulsivity  Risk Reduction Factors:   Living with another person, especially a relative, Positive social support and Positive therapeutic relationship  Continued Clinical Symptoms:  Schizophrenia:   Paranoid or undifferentiated type  Cognitive Features That Contribute To  Risk:  None    Suicide Risk:  Minimal: No identifiable suicidal ideation.  Patients presenting with no risk factors but with morbid ruminations; may be classified as minimal risk based on the severity of the depressive symptoms  Follow-up Information    [facility] Follow up on 08/09/2018.   Why: Please attend your follow up appointment on 08/09/2018 at 9:30. Please bring insurance card and ID.  This meeting will be face to face. Contact information: [place] [phone]           Plan Of Care/Follow-up recommendations:  Activity:  Activity as tolerated Diet:  Regular diet Other:  Follow-up with [facility].  In the meantime if needed try communicating with previous providers in [place].  Return to the hospital if symptoms are much worse.  Patient currently upbeat no suicidal ideation at all does not appear to be at elevated risk of dangerousness.  Alethia Berthold, MD 08/04/2018, 9:39 AM

## 2018-08-04 NOTE — Progress Notes (Signed)
  [facility] Adult Case Management Discharge Plan :  Will you be returning to the same living situation after discharge:  Yes,  pt reports plans to return to his cousin's home. At discharge, do you have transportation home?: Yes,  CSW will assist with cab voucher. Do you have the ability to pay for your medications: Yes,  Medicare/Medicaid.  Release of information consent forms completed and in the chart;  Patient's signature needed at discharge.  Patient to Follow up at: Follow-up Information    [facility] Follow up on 08/09/2018.   Why: Please attend your follow up appointment on 08/09/2018 at 9:30. Please bring insurance card and ID.  This meeting will be face to face. Contact information: [place] [phone] [phone]           Next level of care provider has access to [place] and Suicide Prevention discussed: No. Patient declined SPE.  Have you used any form of tobacco in the last 30 days? (Cigarettes, Smokeless Tobacco, Cigars, and/or Pipes): Yes  Has patient been referred to the Quitline?: Patient refused referral  Patient has been referred for addiction treatment: [place], LCSW 08/04/2018, 9:20 AM

## 2018-08-04 NOTE — BHH Counselor (Signed)
CSW notes that she asked the patient about following up with his ACTT team, [facility]. Patient declined to sign consent stating that he wanted to follow up with [facility] at this time.   Assunta Curtis, MSW, LCSW 08/04/2018 9:25 AM

## 2018-08-04 NOTE — Progress Notes (Signed)
Patient is adjusting well in the unit, complying with medication regimen and therapy with out any issues , denies any SI/HI/AVH, present no safety concerns and no complains , support and encouragement is provided to increase mental and emotional state. Patient is maintaining safety appetite is good , mood and affect is good ,only requiring 15 minutes safety check no distress.

## 2018-08-04 NOTE — Plan of Care (Signed)
  Problem: Education: Goal: Utilization of techniques to improve thought processes will improve Outcome: Progressing Goal: Knowledge of the prescribed therapeutic regimen will improve Outcome: Progressing   Problem: Activity: Goal: Interest or engagement in leisure activities will improve Outcome: Progressing Goal: Imbalance in normal sleep/wake cycle will improve Outcome: Progressing   Problem: Coping: Goal: Coping ability will improve Outcome: Progressing Goal: Will verbalize feelings Outcome: Progressing   Problem: Health Behavior/Discharge Planning: Goal: Ability to make decisions will improve Outcome: Progressing Goal: Compliance with therapeutic regimen will improve Outcome: Progressing   Problem: Role Relationship: Goal: Will demonstrate positive changes in social behaviors and relationships Outcome: Progressing   Problem: Safety: Goal: Ability to disclose and discuss suicidal ideas will improve Outcome: Progressing Goal: Ability to identify and utilize support systems that promote safety will improve Outcome: Progressing   Problem: Self-Concept: Goal: Will verbalize positive feelings about self Outcome: Progressing Goal: Level of anxiety will decrease Outcome: Progressing   Problem: Education: Goal: Ability to make informed decisions regarding treatment will improve Outcome: Progressing   Problem: Coping: Goal: Coping ability will improve Outcome: Progressing   Problem: Health Behavior/Discharge Planning: Goal: Identification of resources available to assist in meeting health care needs will improve Outcome: Progressing   Problem: Medication: Goal: Compliance with prescribed medication regimen will improve Outcome: Progressing   Problem: Self-Concept: Goal: Ability to disclose and discuss suicidal ideas will improve Outcome: Progressing Goal: Will verbalize positive feelings about self Outcome: Progressing   Problem: Education: Goal: Ability to  state activities that reduce stress will improve Outcome: Progressing   Problem: Coping: Goal: Ability to identify and develop effective coping behavior will improve Outcome: Progressing   Problem: Self-Concept: Goal: Ability to identify factors that promote anxiety will improve Outcome: Progressing Goal: Level of anxiety will decrease Outcome: Progressing Goal: Ability to modify response to factors that promote anxiety will improve Outcome: Progressing   Problem: Education: Goal: Knowledge of disease or condition will improve Outcome: Progressing Goal: Understanding of discharge needs will improve Outcome: Progressing   Problem: Health Behavior/Discharge Planning: Goal: Ability to identify changes in lifestyle to reduce recurrence of condition will improve Outcome: Progressing Goal: Identification of resources available to assist in meeting health care needs will improve Outcome: Progressing   Problem: Physical Regulation: Goal: Complications related to the disease process, condition or treatment will be avoided or minimized Outcome: Progressing   Problem: Safety: Goal: Ability to remain free from injury will improve Outcome: Progressing

## 2018-08-04 NOTE — Progress Notes (Signed)
Patient discharged to his cousin's house. Discharge instructions provided and patient verbalized understanding. Will follow up at [facility] on 08/09/2018 at 0930. Marland Kitchen All belongings returned.to patient. No sign of distress. Denying SI/HI upon discharge.

## 2018-08-09 ENCOUNTER — Emergency Department
Admission: EM | Admit: 2018-08-09 | Discharge: 2018-08-09 | Disposition: A | Discharge status: Other Acute Inpatient Hospital | Payer: Medicare PPO | Attending: Student | Admitting: Student

## 2018-08-09 ENCOUNTER — Encounter: Payer: Self-pay | Admitting: Psychiatry

## 2018-08-09 ENCOUNTER — Encounter: Payer: Self-pay | Admitting: Emergency Medicine

## 2018-08-09 ENCOUNTER — Other Ambulatory Visit: Payer: Self-pay

## 2018-08-09 ENCOUNTER — Inpatient Hospital Stay
Admission: AD | Admit: 2018-08-09 | Discharge: 2018-08-17 | DRG: 885 | Disposition: A | Discharge status: Home / Self Care | Payer: Medicare PPO | Attending: Psychiatry | Admitting: Psychiatry

## 2018-08-09 DIAGNOSIS — F209 Schizophrenia, unspecified: Principal | ICD-10-CM | POA: Diagnosis present

## 2018-08-09 DIAGNOSIS — F319 Bipolar disorder, unspecified: Secondary | ICD-10-CM | POA: Diagnosis present

## 2018-08-09 DIAGNOSIS — Z20828 Contact with and (suspected) exposure to other viral communicable diseases: Secondary | ICD-10-CM | POA: Diagnosis present

## 2018-08-09 DIAGNOSIS — F200 Paranoid schizophrenia: Secondary | ICD-10-CM | POA: Diagnosis not present

## 2018-08-09 DIAGNOSIS — F203 Undifferentiated schizophrenia: Secondary | ICD-10-CM | POA: Diagnosis not present

## 2018-08-09 DIAGNOSIS — J45909 Unspecified asthma, uncomplicated: Secondary | ICD-10-CM | POA: Diagnosis not present

## 2018-08-09 DIAGNOSIS — F201 Disorganized schizophrenia: Secondary | ICD-10-CM | POA: Diagnosis present

## 2018-08-09 DIAGNOSIS — F22 Delusional disorders: Secondary | ICD-10-CM | POA: Diagnosis present

## 2018-08-09 LAB — COMPREHENSIVE METABOLIC PANEL
ALT: 13 U/L (ref 0–44)
AST: 20 U/L (ref 15–41)
Albumin: 4.2 g/dL (ref 3.5–5.0)
Alkaline Phosphatase: 69 U/L (ref 38–126)
Anion gap: 11 (ref 5–15)
BUN: 10 mg/dL (ref 6–20)
CO2: 24 mmol/L (ref 22–32)
Calcium: 8.6 mg/dL — ABNORMAL LOW (ref 8.9–10.3)
Chloride: 101 mmol/L (ref 98–111)
Creatinine, Ser: 0.81 mg/dL (ref 0.61–1.24)
GFR calc Af Amer: >60 mL/min (ref >60)
GFR calc non Af Amer: >60 mL/min (ref >60)
Glucose, Bld: 116 mg/dL — ABNORMAL HIGH (ref 70–99)
Potassium: 3.4 mmol/L — ABNORMAL LOW (ref 3.5–5.1)
Sodium: 136 mmol/L (ref 135–145)
Total Bilirubin: 0.7 mg/dL (ref 0.3–1.2)
Total Protein: 7.1 g/dL (ref 6.5–8.1)

## 2018-08-09 LAB — CBC
HCT: 40.9 % (ref 39.0–52.0)
Hemoglobin: 13.9 g/dL (ref 13.0–17.0)
MCH: 29.4 pg (ref 26.0–34.0)
MCHC: 34 g/dL (ref 30.0–36.0)
MCV: 86.7 fL (ref 80.0–100.0)
Platelets: 367 10*3/uL (ref 150–400)
RBC: 4.72 MIL/uL (ref 4.22–5.81)
RDW: 12.4 % (ref 11.5–15.5)
WBC: 11.6 10*3/uL — ABNORMAL HIGH (ref 4.0–10.5)
nRBC: 0 % (ref 0.0–0.2)

## 2018-08-09 LAB — URINALYSIS, ROUTINE W REFLEX MICROSCOPIC
Bilirubin Urine: NEGATIVE
Glucose, UA: NEGATIVE mg/dL
Hgb urine dipstick: NEGATIVE
Ketones, ur: NEGATIVE mg/dL
Leukocytes,Ua: NEGATIVE
Nitrite: NEGATIVE
Protein, ur: NEGATIVE mg/dL
Specific Gravity, Urine: 1.006 (ref 1.005–1.030)
pH: 6 (ref 5.0–8.0)

## 2018-08-09 LAB — URINE DRUG SCREEN, QUALITATIVE (ARMC ONLY)
Amphetamines, Ur Screen: NOT DETECTED
Barbiturates, Ur Screen: NOT DETECTED
Benzodiazepine, Ur Scrn: NOT DETECTED
Cannabinoid 50 Ng, Ur ~~LOC~~: POSITIVE — AB
Cocaine Metabolite,Ur ~~LOC~~: NOT DETECTED
MDMA (Ecstasy)Ur Screen: NOT DETECTED
Methadone Scn, Ur: NOT DETECTED
Opiate, Ur Screen: NOT DETECTED
Phencyclidine (PCP) Ur S: NOT DETECTED
Tricyclic, Ur Screen: NOT DETECTED

## 2018-08-09 LAB — LIPID PANEL
Cholesterol: 134 mg/dL (ref 0–200)
HDL: 39 mg/dL — ABNORMAL LOW (ref >40)
LDL Cholesterol: 75 mg/dL (ref 0–99)
Total CHOL/HDL Ratio: 3.4 RATIO
Triglycerides: 99 mg/dL (ref <150)
VLDL: 20 mg/dL (ref 0–40)

## 2018-08-09 LAB — ETHANOL: Alcohol, Ethyl (B): <10 mg/dL (ref <10)

## 2018-08-09 LAB — SARS CORONAVIRUS 2 BY RT PCR (HOSPITAL ORDER, PERFORMED IN ~~LOC~~ HOSPITAL LAB): SARS Coronavirus 2: NEGATIVE

## 2018-08-09 LAB — ACETAMINOPHEN LEVEL: Acetaminophen (Tylenol), Serum: <10 ug/mL — ABNORMAL LOW (ref 10–30)

## 2018-08-09 LAB — TSH: TSH: 1.54 u[IU]/mL (ref 0.350–4.500)

## 2018-08-09 LAB — SARS CORONAVIRUS 2 (TAT 6-24 HRS): SARS Coronavirus 2: NEGATIVE

## 2018-08-09 LAB — SALICYLATE LEVEL: Salicylate Lvl: <7 mg/dL (ref 2.8–30.0)

## 2018-08-09 MED ORDER — MAGNESIUM HYDROXIDE 400 MG/5ML PO SUSP: 30.0000 mL | Freq: Every day | ORAL | Status: DC | PRN

## 2018-08-09 MED ORDER — ALUM & MAG HYDROXIDE-SIMETH 200-200-20 MG/5ML PO SUSP: 30.0000 mL | ORAL | Status: DC | PRN

## 2018-08-09 MED ORDER — TRAZODONE HCL 50 MG PO TABS
50.0000 mg | ORAL_TABLET | Freq: Every day | ORAL | Status: DC
  Administered 2018-08-09 – 2018-08-16 (×8): 50 mg via ORAL
  Filled 2018-08-09 (×8): qty 1

## 2018-08-09 MED ORDER — DIVALPROEX SODIUM 500 MG PO DR TAB
500.0000 mg | DELAYED_RELEASE_TABLET | Freq: Two times a day (BID) | ORAL | Status: DC
  Administered 2018-08-09 – 2018-08-12 (×6): 500 mg via ORAL
  Filled 2018-08-09 (×6): qty 1

## 2018-08-09 MED ORDER — DIVALPROEX SODIUM 500 MG PO DR TAB
500.0000 mg | DELAYED_RELEASE_TABLET | Freq: Two times a day (BID) | ORAL | Status: DC
  Administered 2018-08-09: 500 mg via ORAL
  Filled 2018-08-09: qty 1

## 2018-08-09 MED ORDER — TRAZODONE HCL 50 MG PO TABS: 50.0000 mg | ORAL_TABLET | Freq: Every day | ORAL | Status: DC

## 2018-08-09 NOTE — ED Notes (Signed)
Hourly rounding reveals patient sleeping in room. No complaints, stable, in no acute distress. Q15 minute rounds and monitoring via [employer] to continue. 

## 2018-08-09 NOTE — ED Notes (Signed)
This nurse received report from [facility]

## 2018-08-09 NOTE — BH Assessment (Signed)
Patient is to be admitted to [facility] BMU by Gust Rung, NP.  Attending Physician will be Dr. Weber Cooks.   Patient has been assigned to room 314, by [facility] Charge Nurse T[place].   Intake Paper Work has been signed and placed on patient chart.   ER staff is aware of the admission:  [place], ER Secretary    Dr. Royden Purl, ER MD   Donneta Romberg, Patient's Nurse   Elberta Fortis, Patient Access.

## 2018-08-09 NOTE — Tx Team (Signed)
Initial Treatment Plan 08/09/2018 5:49 PM Kenneth Jensen ZOX:[id number]    PATIENT STRESSORS: Marital or family conflict Medication change or noncompliance Other: psychosis   PATIENT STRENGTHS: Motivation for treatment/growth Physical Health   PATIENT IDENTIFIED PROBLEMS: Psychosis 08/09/2018  Medication non-compliance 08/09/2018                   DISCHARGE CRITERIA:  Ability to meet basic life and health needs Adequate post-discharge living arrangements Improved stabilization in mood, thinking, and/or behavior Need for constant or close observation no longer present Verbal commitment to aftercare and medication compliance  PRELIMINARY DISCHARGE PLAN: Outpatient therapy Placement in alternative living arrangements  PATIENT/FAMILY INVOLVEMENT: This treatment plan has been presented to and reviewed with the patient, Kenneth Jensen.The patient has been given the opportunity to ask questions and make suggestions.  Reyes Ivan, RN 08/09/2018, 5:49 PM
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
## 2018-08-09 NOTE — ED Provider Notes (Signed)
[facility] Emergency Department Provider Note  ____________________________________________   First MD Initiated Contact with Patient 08/09/18 [phone]     (approximate)  I have reviewed the triage vital signs and the nursing notes.   HISTORY  Chief Complaint Psychiatric Evaluation  Level 5 caveat:  history/ROS may be limited by chronic psychiatric illness.   HPI Kenneth Jensen is a 28 y.o. male with medical history as listed below who presents for unclear reasons.  He comes in voluntarily and is unwilling or unable to provide much history.  He has no specific complaints except "I am sick".  He denies any specific symptoms as listed below in the review of symptoms.  He wanted to be tested for COVID-19 because he claims that his cousin is "hiding the virus".  He denies suicidal ideation and homicidal ideation.  It is unclear why he came in tonight except that he wanted evaluation of his schizophrenia.  He was admitted to [facility] behavioral medicine less than a week ago.         Past Medical History:  Diagnosis Date  . Asthma   . Bipolar 1 disorder (HCC)   . Depression   . Schizotaxia     Patient Active Problem List   Diagnosis Date Noted  . Schizophrenia (HCC) 08/02/2018    History reviewed. No pertinent surgical history.  Prior to Admission medications   Not on File    Allergies Vicodin [hydrocodone-acetaminophen]  No family history on file.  Social History Social History   Tobacco Use  . Smoking status: Never Smoker  . Smokeless tobacco: Never Used  Substance Use Topics  . Alcohol use: Yes    Comment: socially  . Drug use: Yes    Types: Marijuana    Review of Systems Level 5 caveat:  history/ROS may be limited by chronic psychiatric illness.  Constitutional: No fever/chills Eyes: No visual changes. ENT: No sore throat. Cardiovascular: Denies chest pain. Respiratory: Denies shortness of breath. Gastrointestinal: No  abdominal pain.  No nausea, no vomiting.  No diarrhea.  No constipation. Genitourinary: Negative for dysuria. Musculoskeletal: Negative for neck pain.  Negative for back pain. Integumentary: Negative for rash. Neurological: Negative for headaches, focal weakness or numbness. Psych: Denies SI/HI, endorses occasionally hearing voices, unwilling or unable to provide additional history.  ____________________________________________   PHYSICAL EXAM:  VITAL SIGNS: ED Triage Vitals [08/09/18 0028]  Enc Vitals Group     BP 135/79     Pulse Rate 92     Resp 18     Temp 98.8 F (37.1 C)     Temp Source Oral     SpO2 100 %     Weight 77.1 kg (170 lb)     Height 1.702 m (5\' 7" )     Head Circumference      Peak Flow      Pain Score 10     Pain Loc      Pain Edu?      Excl. in GC?     Constitutional: Awake and alert, no acute distress. Eyes: Conjunctivae are normal.  Head: Atraumatic. Nose: No congestion/rhinnorhea. Mouth/Throat: Mucous membranes are moist. Neck: No stridor.  No meningeal signs.   Cardiovascular: Normal rate, regular rhythm. Good peripheral circulation. Grossly normal heart sounds. Respiratory: Normal respiratory effort.  No retractions. No audible wheezing. Gastrointestinal: Soft and nontender. No distention.  Musculoskeletal: No lower extremity tenderness nor edema. No gross deformities of extremities. Neurologic:  Normal speech and language. No gross focal  neurologic deficits are appreciated.  Skin:  Skin is warm, dry and intact. No rash noted. Psychiatric: Mood and affect are blunted.  The patient is clearly attention seeking and making many demands on ED staff and nurses.  He is evasive and refusing to answer very many questions unless they are simple yes and no questions about symptoms.  No suicidal ideation.  ____________________________________________   LABS (all labs ordered are listed, but only abnormal results are displayed)  Labs Reviewed   COMPREHENSIVE METABOLIC PANEL - Abnormal; Notable for the following components:      Result Value   Potassium 3.4 (*)    Glucose, Bld 116 (*)    Calcium 8.6 (*)    All other components within normal limits  ACETAMINOPHEN LEVEL - Abnormal; Notable for the following components:   Acetaminophen (Tylenol), Serum <10 (*)    All other components within normal limits  CBC - Abnormal; Notable for the following components:   WBC 11.6 (*)    All other components within normal limits  SARS CORONAVIRUS 2  ETHANOL  SALICYLATE LEVEL  URINE DRUG SCREEN, QUALITATIVE ([facility] ONLY)   ____________________________________________  EKG  No indication for EKG ____________________________________________  RADIOLOGY   ED MD interpretation: No indication for acute imaging  Official radiology report(s): No results found.  ____________________________________________   PROCEDURES   Procedure(s) performed (including Critical Care):  Procedures   ____________________________________________   INITIAL IMPRESSION / MDM / [place] / ED COURSE  As part of my medical decision making, I reviewed the following data within the [facility] notes reviewed and incorporated, Labs reviewed , Old chart reviewed, A consult was requested and obtained from this/these consultant(s) Psychiatry and Notes from prior ED visits   Differential diagnosis includes, but is not limited to, attention seeking behavior, schizophrenia or bipolar disorder, substance abuse.  The patient is well-appearing and in no distress.  His behavior is childish and clearly attention seeking.  He is repeatedly asking for blankets and when he gets when he asked for another 1, asked for a drink and then asked for a different one, took off his mask and threw it on the floor and started yelling that his mask was dirty, etc.  He requires frequent redirection and occasional "scolding" to stop yelling and causing  is seen.  However, he does not clearly meet criteria for involuntary commitment nor for inpatient treatment.  I think his visit here tonight is for secondary gain, whether for attention, food and drink, or because he wants to be admitted is unclear.  I am not placing him under involuntary commitment and if he decides to leave I think that is appropriate because he does not currently represent a danger to himself or anyone else.  He has no signs of any emergent medical condition.  Given his recent admission to behavioral medicine I have ordered a tele-psych consult and will defer to their judgment but he may require reassessment in person tomorrow when psychiatry is available and person.  However, if the patient chooses to leave, he has the capacity to do so in my professional opinion.      Clinical Course as of Aug 08 732  Mon Aug 09, 2018  0331 I reviewed the written report from the tele-psychiatrist who believes the patient represents a danger to himself.  The patient told the tele-psychiatrist that he wanted to kill himself.  Given this new information, I have put the patient under involuntary commitment.  He will  [facility] at [facility].   [CF]    Clinical Course User Index [CF] Loleta RoseForbach, Therma Lasure, MD     ____________________________________________  FINAL CLINICAL IMPRESSION(S) / ED DIAGNOSES  Final diagnoses:  Bipolar 1 disorder (HCC)     MEDICATIONS GIVEN DURING THIS VISIT:  Medications - No data to display   ED Discharge Orders    None      *Please note:  Kenneth Jensen was evaluated in Emergency Department on 08/09/2018 for the symptoms described in the history of present illness. He was evaluated in the context of the global COVID-19 pandemic, which necessitated consideration that the patient might be at risk for infection with the SARS-CoV-2 virus that causes COVID-19. Institutional protocols and algorithms that pertain to the evaluation of patients at risk for COVID-19  are in a state of rapid change based on information released by regulatory bodies including the CDC and federal and state organizations. These policies and algorithms were followed during the patient's care in the ED.  Some ED evaluations and interventions may be delayed as a result of limited staffing during the pandemic.*  Note:  This document was prepared using Dragon voice recognition software and may include unintentional dictation errors.   Loleta RoseForbach, Nicanor Mendolia, MD 08/09/18 [phone]
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
## 2018-08-09 NOTE — ED Notes (Signed)
Patient was found by nurse tech in bathroom with another patient.
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
## 2018-08-09 NOTE — ED Notes (Signed)
Pt. Transferred to [facility] from ED to room 1 after screening for contraband. Report to include Situation, Background, Assessment and Recommendations from Ann RN. Pt. Oriented to unit including Q15 minute rounds as well as the security cameras for their protection. Patient is alert and oriented, warm and dry in no acute distress. Patient denies SI, HI, and AVH. Pt. Encouraged to let me know if needs arise. 

## 2018-08-09 NOTE — Plan of Care (Signed)
Patient just recently admitted to the unit. Patient has not had sufficient time to show progressions at this time. Will continue to monitor for progressions.   Problem: Education: Goal: Mental status will improve Outcome: Not Progressing   Problem: Safety: Goal: Periods of time without injury will increase Outcome: Not Progressing   Problem: Education: Goal: Will be free of psychotic symptoms Outcome: Not Progressing Goal: Knowledge of the prescribed therapeutic regimen will improve Outcome: Not Progressing   Problem: Health Behavior/Discharge Planning: Goal: Compliance with prescribed medication regimen will improve Outcome: Not Progressing

## 2018-08-09 NOTE — ED Notes (Signed)
Report to SOC MD 
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
## 2018-08-09 NOTE — Progress Notes (Signed)
D: Received patient from [facility] Emergency Department. Patient skin assessment completed with T'yawn, RN, skin is intact, with the exception of scar on center of chest, but no contraband found with all unit prohibited items locked and stored away for discharge. Pt. Was admitted under the services of, Dr. Weber Cooks.   A: Patient oriented to unit/room/call light. Pt. Given extensive admissions education. Patient was encourage to participate in unit activities and continue with plan of care being put into place. Q x 15 minute observation checks were initiated for safety. Pt. Given dinner to eat.   R: Patient is receptive to treatment plan being put into place and safety to be maintained on unit per MD orders.

## 2018-08-09 NOTE — ED Triage Notes (Signed)
Patient states that he want to be evaluated for his schizophrenia.  Patient also states that he wants to be test for covid again. Patient states that his cousin who he lives with is hiding the virus.

## 2018-08-09 NOTE — ED Notes (Signed)
Patient keeps coming out of his room, cursing and then crying and telling staff we dont understand him, patient appears to be irritated that he overheard Probation officer called his cousin, Probation officer moved out of patients earshot when his cousin came on the phone, patient began yelling dont talk with her she doesn't like me, then he asked to talk with her. Patient then stated he wanted to go to a group home

## 2018-08-09 NOTE — BH Assessment (Signed)
Assessment Note  Kenneth Jensen is an 28 y.o. male. Who presents seeking a psychiatric evaluation. Pt primary complaint is that he claims that his cousin is "hiding the covid virus". Patient well known to the service with multiple admissions, most admission occurring on 07/2018. Please refer to most recent assessment for complete psychosocial history. Pt presents today as oriented X4 and with complaints of " my cousin called me a sissy" . Patient has had multiple visit to the emergency department with the history of Schizophrenia. Pt. denies any suicidal ideation, plan or intent. Pt. denies the presence of any auditory or visual hallucinations at this time. Patient denies any other medical complaints.   Diagnosis: Schizophrenia   Past Medical History:  Past Medical History:  Diagnosis Date  . Asthma   . Bipolar 1 disorder (HCC)   . Depression   . Schizotaxia     History reviewed. No pertinent surgical history.  Family History: No family history on file.  Social History:  reports that he has never smoked. He has never used smokeless tobacco. He reports current alcohol use. He reports current drug use. Drug: Marijuana.  Additional Social History:  Alcohol / Drug Use Pain Medications: SEE PTA Prescriptions: SEE PTA Over the Counter: SEE PTA History of alcohol / drug use?: Yes Substance #1 Name of Substance 1: THC 1 - Age of First Use: TEEN 1 - Amount (size/oz): UTA 1 - Frequency: UTA 1 - Duration: Ongoing 1 - Last Use / Amount: PTA  CIWA: CIWA-Ar BP: 135/79 Pulse Rate: 92 COWS:    Allergies:  Allergies  Allergen Reactions  . Vicodin [Hydrocodone-Acetaminophen]     Home Medications: (Not in a hospital admission)   OB/GYN Status:  No LMP for male patient.  General Assessment Data Location of Assessment: [facility] ED TTS Assessment: In system Is this a Tele or Face-to-Face Assessment?: Tele Assessment Is this an Initial Assessment or a Re-assessment for this encounter?:  Initial Assessment Patient Accompanied by:: N/A Language Other than English: No Living Arrangements: Other (Comment) What gender do you identify as?: Male Marital status: Single Living Arrangements: Other relatives Can pt return to current living arrangement?: Yes Admission Status: Voluntary Is patient capable of signing voluntary admission?: No Referral Source: Self/Family/Friend Insurance type: [employer] and Medicaid   Medical Screening Exam [facility] Walk-in ONLY) Medical Exam completed: Yes  Crisis Care Plan Living Arrangements: Other relatives Legal Guardian: Maternal Grandmother(Per clients report) Name of Psychiatrist: [facility] Name of Therapist: [facility]  Education Status Is patient currently in school?: No Highest grade of school patient has completed: 10th Is the patient employed, unemployed or receiving disability?: Receiving disability income  Risk to self with the past 6 months Suicidal Ideation: No Has patient been a risk to self within the past 6 months prior to admission? : No Suicidal Intent: No Has patient had any suicidal intent within the past 6 months prior to admission? : No Is patient at risk for suicide?: No, but patient needs Medical Clearance Suicidal Plan?: No-Not Currently/Within Last 6 Months Has patient had any suicidal plan within the past 6 months prior to admission? : No Access to Means: No What has been your use of drugs/alcohol within the last 12 months?: THC Previous Attempts/Gestures: No Other Self Harm Risks: none Triggers for Past Attempts: None known Intentional Self Injurious Behavior: None Family Suicide History: No Recent stressful life event(s): Conflict (Comment) Persecutory voices/beliefs?: Yes Depression: Yes Depression Symptoms: Insomnia Substance abuse history and/or treatment for substance abuse?:  No Suicide prevention information given to non-admitted patients: Not applicable  Risk to Others within the  past 6 months Homicidal Ideation: No Does patient have any lifetime risk of violence toward others beyond the six months prior to admission? : No Thoughts of Harm to Others: No Current Homicidal Intent: No Current Homicidal Plan: No Access to Homicidal Means: No Identified Victim: none  History of harm to others?: No Assessment of Violence: None Noted Does patient have access to weapons?: No Criminal Charges Pending?: No Does patient have a court date: No Is patient on probation?: No  Psychosis Hallucinations: Auditory, Visual Delusions: Unspecified  Mental Status Report Appearance/Hygiene: Poor hygiene, Disheveled Eye Contact: Fair Motor Activity: Freedom of movement Speech: Slow Level of Consciousness: Alert Mood: Preoccupied Affect: Appropriate to circumstance Anxiety Level: None Thought Processes: Circumstantial, Irrelevant Judgement: Partial Orientation: Situation, Time, Place, Person Obsessive Compulsive Thoughts/Behaviors: None  Cognitive Functioning Concentration: Fair Memory: Remote Intact, Recent Intact Insight: Poor Impulse Control: Poor Appetite: Good Have you had any weight changes? : No Change Sleep: Decreased Total Hours of Sleep: 2 Vegetative Symptoms: None  ADLScreening [facility] Assessment Services) Patient's cognitive ability adequate to safely complete daily activities?: No Patient able to express need for assistance with ADLs?: Yes Independently performs ADLs?: Yes (appropriate for developmental age)  Prior Inpatient Therapy Prior Inpatient Therapy: Yes Prior Therapy Dates: 08/03/2018 Prior Therapy Facilty/Provider([facility] Reason for Treatment: Schizophrenia   Prior Outpatient Therapy Prior Outpatient Therapy: Yes Prior Therapy Dates: current Prior Therapy Facilty/Provider(s): [facility] Reason for Treatment: Schizophrenia  Does patient have an ACCT team?: Unknown Does patient have Intensive In-House Services?  : No Does patient  have [facility] services? : No Does patient have P4CC services?: No  ADL Screening (condition at time of admission) Patient's cognitive ability adequate to safely complete daily activities?: No Patient able to express need for assistance with ADLs?: Yes Independently performs ADLs?: Yes (appropriate for developmental age)       Abuse/Neglect Assessment (Assessment to be complete while patient is alone) Physical Abuse: Denies Verbal Abuse: Denies Sexual Abuse: Denies Exploitation of patient/patient's resources: Denies Self-Neglect: Denies Values / Beliefs Cultural Requests During Hospitalization: None Spiritual Requests During Hospitalization: None Consults Spiritual Care Consult Needed: No Social Work Consult Needed: No Regulatory affairs officer (For Healthcare) Does Patient Have a Medical Advance Directive?: No          Disposition:  Disposition Initial Assessment Completed for this Encounter: Yes Patient referred to: Other (Comment)(SOC Consult )  On Site Evaluation by:   Reviewed with Physician:    Laretta Alstrom 08/09/2018 2:51 AM

## 2018-08-09 NOTE — ED Notes (Signed)
Gave breakfast tray with juice. 

## 2018-08-09 NOTE — Progress Notes (Signed)
Patient spent most of shift resting in bed, up for snack and medications,he denies any halluciantions on shift. Patient is resting in bed eyes closed at this time. No issues to report on shift at this time.

## 2018-08-10 DIAGNOSIS — F203 Undifferentiated schizophrenia: Secondary | ICD-10-CM

## 2018-08-10 MED ORDER — NICOTINE 14 MG/24HR TD PT24
14.0000 mg | MEDICATED_PATCH | Freq: Every day | TRANSDERMAL | Status: DC
  Administered 2018-08-10 – 2018-08-16 (×6): 14 mg via TRANSDERMAL
  Filled 2018-08-10 (×7): qty 1

## 2018-08-10 MED ORDER — QUETIAPINE FUMARATE 100 MG PO TABS
100.0000 mg | ORAL_TABLET | Freq: Every day | ORAL | Status: DC
  Administered 2018-08-10: 100 mg via ORAL
  Filled 2018-08-10: qty 1
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
## 2018-08-10 NOTE — Progress Notes (Signed)
Recreation Therapy Notes  INPATIENT RECREATION THERAPY ASSESSMENT  Patient Details Name: Kenneth Jensen MRN: [id number] DOB: October 02, 1990 Today's Date: 08/10/2018       Information Obtained From: Patient  Able to Participate in Assessment/Interview: Yes  Patient Presentation: Responsive  Reason for Admission (Per Patient): Active Symptoms  Patient Stressors:    Coping Skills:   Music, Prayer  Leisure Interests (2+):  Music - Listen  Frequency of Recreation/Participation: Weekly  Awareness of Community Resources:     Community Resources:     Current Use:    If no, Barriers?:    Expressed Interest in [employer] Information:    [place] of Residence:  Insurance underwriter  Patient Main Form of Transportation: Taxi  Patient Strengths:  N/A  Patient Identified Areas of Improvement:  N/A  Patient Goal for Hospitalization:  Getting into a group home  Current SI (including self-harm):  No  Current HI:  No  Current AVH: No  Staff Intervention Plan: Group Attendance, Collaborate with Interdisciplinary Treatment Team  Consent to Intern Participation: N/A  Danniela Mcbrearty 08/10/2018, 12:08 PM
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
## 2018-08-10 NOTE — BHH Suicide Risk Assessment (Signed)
[facility] INPATIENT:  Family/Significant Other Suicide Prevention Education  Suicide Prevention Education:  Education Completed; Finneas Mathe, uncle [phone] has been identified by the patient as the family member/significant other with whom the patient will be residing, and identified as the person(s) who will aid the patient in the event of a mental health crisis (suicidal ideations/suicide attempt).  With written consent from the patient, the family member/significant other has been provided the following suicide prevention education, prior to the and/or following the discharge of the patient.  The suicide prevention education provided includes the following:  Suicide risk factors  Suicide prevention and interventions  National Suicide Hotline telephone number  [facility] assessment telephone number  [facility] Emergency Assistance [place] and/or Residential Mobile Crisis Unit telephone number  Request made of family/significant other to:  Remove weapons (e.g., guns, rifles, knives), all items previously/currently identified as safety concern.    Remove drugs/medications (over-the-counter, prescriptions, illicit drugs), all items previously/currently identified as a safety concern.  The family member/significant other verbalizes understanding of the suicide prevention education information provided.  The family member/significant other agrees to remove the items of safety concern listed above.  CSW spoke with pts uncle who reported that pt is in the hospital because he has been stuck in the house bored, has no friends or family in the area so he is isolated. Per uncle pt lives with his cousin and is returning there at discharge. Uncle states that pt cannot come to [place] due to not having anywhere to stay in [place]. Per uncle, pt is welcome to come visit but cannot live with him. Uncle denies any SI/HI concerns and denies any concerns with pt returning to  his cousins house. Uncle denies any knowledge of guns/weapons in the home.  Angelina MSW LCSW 08/10/2018, 10:16 AM

## 2018-08-10 NOTE — Progress Notes (Signed)
CSW spoke with Kenneth Jensen, psychiatrist with [facility] at [phone]. Kenneth Jensen reported that due to Kenneth Jensen living in [facility], they are in the process of transferring client to an ACT team in [place]. Per Kenneth Jensen, Kenneth Jensen has no support in [place] and there is no sort of permanent placement for Kenneth Jensen in [place]. Kenneth Jensen reported that Kenneth Jensen has been living with his cousin for years and that to his knowledge, the cousin is trying to gain guardianship of the Kenneth Jensen through [facility] court system. Kenneth Jensen requested a phone call from attending physician and Kenneth Jensen notified physician.    Kenneth Jensen, MSW, LCSW Clinical Social Work 08/10/2018 10:21 AM

## 2018-08-10 NOTE — Progress Notes (Signed)
Recreation Therapy Notes   Date: 08/10/2018  Time: 9:30 am   Location: Craft room   Behavioral response: N/A   Intervention Topic: Stress   Discussion/Intervention: Patient did not attend group.   Clinical Observations/Feedback:  Patient did not attend group.   Jahmil Macleod LRT/CTRS        Jeremian Whitby 08/10/2018 10:58 AM

## 2018-08-10 NOTE — [facility] Group Notes (Signed)
LCSW Group Therapy Note  08/10/2018 1:00 PM  Type of Therapy/Topic:  Group Therapy:  Feelings about Diagnosis  Participation Level:  Active   Description of Group:   This group will allow patients to explore their thoughts and feelings about diagnoses they have received. Patients will be guided to explore their level of understanding and acceptance of these diagnoses. Facilitator will encourage patients to process their thoughts and feelings about the reactions of others to their diagnosis and will guide patients in identifying ways to discuss their diagnosis with significant others in their lives. This group will be process-oriented, with patients participating in exploration of their own experiences, giving and receiving support, and processing challenge from other group members.   Therapeutic Goals: 1. Patient will demonstrate understanding of diagnosis as evidenced by identifying two or more symptoms of the disorder 2. Patient will be able to express two feelings regarding the diagnosis 3. Patient will demonstrate their ability to communicate their needs through discussion and/or role play  Summary of Patient Progress: Patient was present for group and an active participant.  Patient share how it has been "frustrating and heart-breaking" dealing with his diagnosis.  Patient shared the difficulties of his family due to a lack of understanding regarding his diagnosis.  He shared how he walks for coping.    Therapeutic Modalities:   Cognitive Behavioral Therapy Brief Therapy Feelings Identification   Assunta Curtis, MSW, LCSW 08/10/2018 10:36 AM

## 2018-08-10 NOTE — H&P (Signed)
Psychiatric Admission Assessment Adult  Patient Identification: Kenneth JoyChristopher Jensen MRN:  [id number] Date of Evaluation:  08/10/2018 Chief Complaint:  Schizophrenia Principal Diagnosis: Schizophrenia (HCC) Diagnosis:  Principal Problem:   Schizophrenia (HCC)  History of Present Illness: Patient seen chart reviewed.  28 year old man with schizophrenia brought to the emergency room initially as a voluntary patient but then petition for commitment because of perceived disorganization in his thought.  Patient today has little or no specific psychiatric complaint.  He does discuss however multiple recent stresses.  The coronavirus situation has been on his nerves pretty badly.  It sounds like he developed a fear that 1 of the people in the house where he lives was going to infect him with coronavirus and this has increased his anxiety and was what led him to come to the emergency room.  Patient indicates that he has some chronic feeling of the voice of Satan but does not seem to make much of it or be too bothered by it.  Denies other hallucinations and denies other psychotic symptoms.  He indicates that his mood does feel nervous at times.  He misses his friends back in [place] and has little social life.  Also claims that his cousin and her family frequently say insulting things to him which gets him upset.  Patient however denies any suicidal or homicidal ideation.  I have not yet been able to reach his cousin but have been told secondhand that she complained that a new medicine (calypta) had recently been prescribed for the patient and she felt like it made him "worse".  Patient says he also does not think that it was helpful.  Patient denies drinking or using any drugs recently. Associated Signs/Symptoms: Depression Symptoms:  anhedonia, fatigue, (Hypo) Manic Symptoms:  Distractibility, Anxiety Symptoms:  Excessive Worry, Psychotic Symptoms:  Hallucinations: Auditory PTSD Symptoms: Negative Total Time  spent with patient: 1 hour  Past Psychiatric History: Patient reportedly has a chronic history of schizophrenia going back several years.  [place]  From what I am told his medication most recently has been long-acting Invega shots.  Patient reports 1 prior hospitalization outside of [place] in the more distant past.  Denies ever having tried to kill himself.  Is the patient at risk to self? No.  Has the patient been a risk to self in the past 6 months? No.  Has the patient been a risk to self within the distant past? No.  Is the patient a risk to others? No.  Has the patient been a risk to others in the past 6 months? No.  Has the patient been a risk to others within the distant past? No.   Prior Inpatient Therapy:   Prior Outpatient Therapy:    Alcohol Screening: 1. How often do you have a drink containing alcohol?: Never("I don't drink anymore") 2. How many drinks containing alcohol do you have on a typical day when you are drinking?: 1 or 2 3. How often do you have six or more drinks on one occasion?: Never AUDIT-C Score: 0 4. How often during the last year have you found that you were not able to stop drinking once you had started?: Never 5. How often during the last year have you failed to do what was normally expected from you becasue of drinking?: Never 6. How often during the last year have you needed a first drink in the morning to get yourself going after a heavy  drinking session?: Never 7. How often during the last year have you had a feeling of guilt of remorse after drinking?: Never 8. How often during the last year have you been unable to remember what happened the night before because you had been drinking?: Never 9. Have you or someone else been injured as a result of your drinking?: No 10. Has a relative or friend or a doctor or another health worker been concerned about your drinking or suggested you cut down?: No Alcohol Use  Disorder Identification Test Final Score (AUDIT): 0 Alcohol Brief Interventions/Follow-up: AUDIT Score <7 follow-up not indicated Substance Abuse History in the last 12 months:  No. Consequences of Substance Abuse: Negative Previous Psychotropic Medications: Yes  Psychological Evaluations: Yes  Past Medical History:  Past Medical History:  Diagnosis Date  . Asthma   . Bipolar 1 disorder (HCC)   . Depression   . Schizotaxia    History reviewed. No pertinent surgical history. Family History: History reviewed. No pertinent family history. Family Psychiatric  History: See previous notes.  No significant known family history Tobacco Screening: Have you used any form of tobacco in the last 30 days? (Cigarettes, Smokeless Tobacco, Cigars, and/or Pipes): Yes Tobacco use, Select all that apply: 5 or more cigarettes per day Are you interested in Tobacco Cessation Medications?: No, patient refused Counseled patient on smoking cessation including recognizing danger situations, developing coping skills and basic information about quitting provided: Yes Social History:  Social History   Substance and Sexual Activity  Alcohol Use Yes   Comment: socially     Social History   Substance and Sexual Activity  Drug Use Yes  . Types: Marijuana    Additional Social History:                           Allergies:   Allergies  Allergen Reactions  . Vicodin [Hydrocodone-Acetaminophen] Itching   Lab Results:  Results for orders placed or performed during the hospital encounter of 08/09/18 (from the past 48 hour(s))  Comprehensive metabolic panel     Status: Abnormal   Collection Time: 08/09/18 12:35 AM  Result Value Ref Range   Sodium 136 135 - 145 mmol/L   Potassium 3.4 (L) 3.5 - 5.1 mmol/L   Chloride 101 98 - 111 mmol/L   CO2 24 22 - 32 mmol/L   Glucose, Bld 116 (H) 70 - 99 mg/dL   BUN 10 6 - 20 mg/dL   Creatinine, Ser 1.610.81 0.61 - 1.24 mg/dL   Calcium 8.6 (L) 8.9 - 10.3 mg/dL    Total Protein 7.1 6.5 - 8.1 g/dL   Albumin 4.2 3.5 - 5.0 g/dL   AST 20 15 - 41 U/L   ALT 13 0 - 44 U/L   Alkaline Phosphatase 69 38 - 126 U/L   Total Bilirubin 0.7 0.3 - 1.2 mg/dL   GFR calc non Af Amer >60 >60 mL/min   GFR calc Af Amer >60 >60 mL/min   Anion gap 11 5 - 15    Comment: Performed at [facility], [address] [place], [place] [address]  Ethanol     Status: None   Collection Time: 08/09/18 12:35 AM  Result Value Ref Range   Alcohol, Ethyl (B) <10 <10 mg/dL    Comment: (NOTE) Lowest detectable limit for serum alcohol is 10 mg/dL. For medical purposes only. Performed at [facility], [address] [place], [place] [address]   Salicylate  level     Status: None   Collection Time: 08/09/18 12:35 AM  Result Value Ref Range   Salicylate Lvl <7.0 2.8 - 30.0 mg/dL    Comment: Performed at [facility], [address] [place], [place] [address]  Acetaminophen level     Status: Abnormal   Collection Time: 08/09/18 12:35 AM  Result Value Ref Range   Acetaminophen (Tylenol), Serum <10 (L) 10 - 30 ug/mL    Comment: (NOTE) Therapeutic concentrations vary significantly. A range of 10-30 ug/mL  may be an effective concentration for many patients. However, some  are best treated at concentrations outside of this range. Acetaminophen concentrations >150 ug/mL at 4 hours after ingestion  and >50 ug/mL at 12 hours after ingestion are often associated with  toxic reactions. Performed at [facility], [address] [place], [place] [address]   cbc     Status: Abnormal   Collection Time: 08/09/18 12:35 AM  Result Value Ref Range   WBC 11.6 (H) 4.0 - 10.5 K/uL   RBC 4.72 4.22 - 5.81 MIL/uL   Hemoglobin 13.9 13.0 - 17.0 g/dL   HCT 19.1 47.8 - 29.5 %   MCV 86.7 80.0 - 100.0 fL   MCH 29.4 26.0 - 34.0 pg   MCHC 34.0 30.0 - 36.0 g/dL   RDW 62.1 30.8 - 65.7 %   Platelets 367 150 - 400 K/uL   nRBC 0.0 0.0 - 0.2 %    Comment: Performed  at [facility], [address] [place], [place] [address]  TSH     Status: None   Collection Time: 08/09/18 12:35 AM  Result Value Ref Range   TSH 1.540 0.350 - 4.500 uIU/mL    Comment: Performed by a 3rd Generation assay with a functional sensitivity of <=0.01 uIU/mL. Performed at [facility], [address] [place], [place] [address]   Lipid panel     Status: Abnormal   Collection Time: 08/09/18 12:35 AM  Result Value Ref Range   Cholesterol 134 0 - 200 mg/dL   Triglycerides 99 <413 mg/dL   HDL 39 (L) >24 mg/dL   Total CHOL/HDL Ratio 3.4 RATIO   VLDL 20 0 - 40 mg/dL   LDL Cholesterol 75 0 - 99 mg/dL    Comment:        Total Cholesterol/HDL:CHD Risk Coronary Heart Disease Risk Table                     Men   Women  1/2 Average Risk   3.4   3.3  Average Risk       5.0   4.4  2 X Average Risk   9.6   7.1  3 X Average Risk  23.4   11.0        Use the calculated Patient Ratio above and the CHD Risk Table to determine the patient's CHD Risk.        ATP III CLASSIFICATION (LDL):  <100     mg/dL   Optimal  401-027  mg/dL   Near or Above                    Optimal  130-159  mg/dL   Borderline  253-664  mg/dL   High  >403     mg/dL   Very High Performed at [facility], [address] [place], [place] [address]   SARS CORONAVIRUS 2 Nasal Swab Aptima Multi Swab  Status: None   Collection Time: 08/09/18  1:43 AM   Specimen: Aptima Multi Swab; Nasal Swab  Result Value Ref Range   SARS Coronavirus 2 NEGATIVE NEGATIVE    Comment: (NOTE) SARS-CoV-2 target nucleic acids are NOT DETECTED. The SARS-CoV-2 RNA is generally detectable in upper and lower respiratory specimens during the acute phase of infection. Negative results do not preclude SARS-CoV-2 infection, do not rule out co-infections with other pathogens, and should not be used as the sole basis for treatment or other patient management decisions. Negative results must be combined with  clinical observations, patient history, and epidemiological information. The expected result is Negative. Fact Sheet for Patients: HairSlick.no Fact Sheet for Healthcare Providers: quierodirigir.com This test is not yet approved or cleared by the [place] FDA and  has been authorized for detection and/or diagnosis of SARS-CoV-2 by FDA under an Emergency Use Authorization (EUA). This EUA will remain  in effect (meaning this test can be used) for the duration of the COVID-19 declaration under Section 56 4(b)(1) of the Act, 21 U.S.C. section 360bbb-3(b)(1), unless the authorization is terminated or revoked sooner. Performed at [facility], [address], [place], [place] [address]   Urine Drug Screen, Qualitative     Status: Abnormal   Collection Time: 08/09/18  2:31 PM  Result Value Ref Range   Tricyclic, Ur Screen NONE DETECTED NONE DETECTED   Amphetamines, Ur Screen NONE DETECTED NONE DETECTED   MDMA (Ecstasy)Ur Screen NONE DETECTED NONE DETECTED   Cocaine Metabolite,Ur [place] NONE DETECTED NONE DETECTED   Opiate, Ur Screen NONE DETECTED NONE DETECTED   Phencyclidine (PCP) Ur S NONE DETECTED NONE DETECTED   Cannabinoid 50 Ng, Ur [place] POSITIVE (A) NONE DETECTED   Barbiturates, Ur Screen NONE DETECTED NONE DETECTED   Benzodiazepine, Ur Scrn NONE DETECTED NONE DETECTED   Methadone Scn, Ur NONE DETECTED NONE DETECTED    Comment: (NOTE) Tricyclics + metabolites, urine    Cutoff 1000 ng/mL Amphetamines + metabolites, urine  Cutoff 1000 ng/mL MDMA (Ecstasy), urine              Cutoff 500 ng/mL Cocaine Metabolite, urine          Cutoff 300 ng/mL Opiate + metabolites, urine        Cutoff 300 ng/mL Phencyclidine (PCP), urine         Cutoff 25 ng/mL Cannabinoid, urine                 Cutoff 50 ng/mL Barbiturates + metabolites, urine  Cutoff 200 ng/mL Benzodiazepine, urine              Cutoff 200 ng/mL Methadone, urine                    Cutoff 300 ng/mL The urine drug screen provides only a preliminary, unconfirmed analytical test result and should not be used for non-medical purposes. Clinical consideration and professional judgment should be applied to any positive drug screen result due to possible interfering substances. A more specific alternate chemical method must be used in order to obtain a confirmed analytical result. Gas chromatography / mass spectrometry (GC/MS) is the preferred confirmat ory method. Performed at [facility], [address] [place], [place] [address]   Urinalysis, Routine w reflex microscopic     Status: Abnormal   Collection Time: 08/09/18  2:31 PM  Result Value Ref Range   Color, Urine YELLOW (A) YELLOW   APPearance CLEAR (A) CLEAR  Specific Gravity, Urine 1.006 1.005 - 1.030   pH 6.0 5.0 - 8.0   Glucose, UA NEGATIVE NEGATIVE mg/dL   Hgb urine dipstick NEGATIVE NEGATIVE   Bilirubin Urine NEGATIVE NEGATIVE   Ketones, ur NEGATIVE NEGATIVE mg/dL   Protein, ur NEGATIVE NEGATIVE mg/dL   Nitrite NEGATIVE NEGATIVE   Leukocytes,Ua NEGATIVE NEGATIVE    Comment: Performed at [facility], [address] [place], [place] [address]  SARS Coronavirus 2 [facility](Hospital order, Performed in [facility] hospital lab) Nasopharyngeal Nasopharyngeal Swab     Status: None   Collection Time: 08/09/18  2:31 PM   Specimen: Nasopharyngeal Swab  Result Value Ref Range   SARS Coronavirus 2 NEGATIVE NEGATIVE    Comment: (NOTE) If result is NEGATIVE SARS-CoV-2 target nucleic acids are NOT DETECTED. The SARS-CoV-2 RNA is generally detectable in upper and lower  respiratory specimens during the acute phase of infection. The lowest  concentration of SARS-CoV-2 viral copies this assay can detect is 250  copies / mL. A negative result does not preclude SARS-CoV-2 infection  and should not be used as the sole basis for treatment or other  patient management decisions.  A negative result  may occur with  improper specimen collection / handling, submission of specimen other  than nasopharyngeal swab, presence of viral mutation(s) within the  areas targeted by this assay, and inadequate number of viral copies  (<250 copies / mL). A negative result must be combined with clinical  observations, patient history, and epidemiological information. If result is POSITIVE SARS-CoV-2 target nucleic acids are DETECTED. The SARS-CoV-2 RNA is generally detectable in upper and lower  respiratory specimens dur ing the acute phase of infection.  Positive  results are indicative of active infection with SARS-CoV-2.  Clinical  correlation with patient history and other diagnostic information is  necessary to determine patient infection status.  Positive results do  not rule out bacterial infection or co-infection with other viruses. If result is PRESUMPTIVE POSTIVE SARS-CoV-2 nucleic acids MAY BE PRESENT.   A presumptive positive result was obtained on the submitted specimen  and confirmed on repeat testing.  While 2019 novel coronavirus  (SARS-CoV-2) nucleic acids may be present in the submitted sample  additional confirmatory testing may be necessary for epidemiological  and / or clinical management purposes  to differentiate between  SARS-CoV-2 and other Sarbecovirus currently known to infect humans.  If clinically indicated additional testing with an alternate test  methodology [id number]) is advised. The SARS-CoV-2 RNA is generally  detectable in upper and lower respiratory sp ecimens during the acute  phase of infection. The expected result is Negative. Fact Sheet for Patients:  BoilerBrush.com.cyhttps://www.fda.gov/media/136312/download Fact Sheet for Healthcare Providers: https://pope.com/https://www.fda.gov/media/136313/download This test is not yet approved or cleared by the [place] FDA and has been authorized for detection and/or diagnosis of SARS-CoV-2 by FDA under an Emergency Use Authorization (EUA).   This EUA will remain in effect (meaning this test can be used) for the duration of the COVID-19 declaration under Section 564(b)(1) of the Act, 21 U.S.C. section 360bbb-3(b)(1), unless the authorization is terminated or revoked sooner. Performed at [facility], [address] [place], [place] [address]     Blood Alcohol level:  Lab Results  Component Value Date   [facility]   ETH <10 08/02/2018    Metabolic Disorder Labs:  No results found for: HGBA1C, MPG No results found for: PROLACTIN Lab Results  Component Value Date   CHOL 134 08/09/2018   TRIG  99 08/09/2018   HDL 39 (L) 08/09/2018   CHOLHDL 3.4 08/09/2018   VLDL 20 08/09/2018   LDLCALC 75 08/09/2018    Current Medications: Current Facility-Administered Medications  Medication Dose Route Frequency Provider Last Rate Last Dose  . alum & mag hydroxide-simeth (MAALOX/MYLANTA) 200-200-20 MG/5ML suspension 30 mL  30 mL Oral Q4H PRN Charm Rings, NP      . divalproex (DEPAKOTE) DR tablet 500 mg  500 mg Oral Q12H Charm Rings, NP   500 mg at 08/10/18 0839  . magnesium hydroxide (MILK OF MAGNESIA) suspension 30 mL  30 mL Oral Daily PRN Charm Rings, NP      . nicotine (NICODERM CQ - dosed in mg/24 hours) patch 14 mg  14 mg Transdermal Daily ,  T, MD      . traZODone (DESYREL) tablet 50 mg  50 mg Oral QHS Charm Rings, NP   50 mg at 08/09/18 2150   PTA Medications: Medications Prior to Admission  Medication Sig Dispense Refill Last Dose  . CAPLYTA 42 MG CAPS Take 42 mg by mouth at bedtime.     Hinda Glatter SUSTENNA 234 MG/1.5ML SUSY injection INJECT 1.5MLS (234MG ) INTRAMUSCULARLY EVERY 2 WEEKS       Musculoskeletal: Strength & Muscle Tone: within normal limits Gait & Station: normal Patient leans: N/A  Psychiatric Specialty Exam: Physical Exam  Nursing note and vitals reviewed. Constitutional: He appears well-developed and well-nourished.  HENT:  Head: Normocephalic and  atraumatic.  Eyes: Pupils are equal, round, and reactive to light. Conjunctivae are normal.  Neck: Normal range of motion.  Cardiovascular: Regular rhythm and normal heart sounds.  Respiratory: Effort normal.  GI: Soft.  Musculoskeletal: Normal range of motion.  Neurological: He is alert.  Skin: Skin is warm and dry.  Psychiatric: His affect is blunt. His speech is delayed. He is slowed. Cognition and memory are impaired. He expresses impulsivity. He expresses no homicidal and no suicidal ideation. He exhibits abnormal recent memory.    Review of Systems  Constitutional: Negative.   HENT: Negative.   Eyes: Negative.   Respiratory: Negative.   Cardiovascular: Negative.   Gastrointestinal: Negative.   Musculoskeletal: Negative.   Skin: Negative.   Neurological: Negative.   Psychiatric/Behavioral: Negative for depression, hallucinations, memory loss, substance abuse and suicidal ideas. The patient is nervous/anxious. The patient does not have insomnia.     Blood pressure 122/75, pulse 81, temperature 99 F (37.2 C), temperature source Oral, resp. rate 17, height 5\' 7"  (1.702 m), weight 87.1 kg, SpO2 100 %.Body mass index is 30.07 kg/m.  General Appearance: Casual  Eye Contact:  Fair  Speech:  Clear and Coherent  Volume:  Normal  Mood:  Anxious  Affect:  Constricted  Thought Process:  Goal Directed  Orientation:  Full (Time, Place, and Person)  Thought Content:  Rumination  Suicidal Thoughts:  No  Homicidal Thoughts:  No  Memory:  Immediate;   Fair Recent;   Fair Remote;   Fair  Judgement:  Impaired  Insight:  Shallow  Psychomotor Activity:  Decreased  Concentration:  Concentration: Fair  Recall:  [employer] of Knowledge:  Fair  Language:  Fair  Akathisia:  No  Handed:  Right  AIMS (if indicated):     Assets:  Desire for Improvement Housing Physical Health Resilience  ADL's:  Impaired  Cognition:  Impaired,  Mild  Sleep:  Number of Hours: 9    Treatment Plan  Summary: Daily contact with patient  to assess and evaluate symptoms and progress in treatment, Medication management and Plan Patient will be on 15-minute checks.  Labs reviewed.  PRN medicine available.  At his request I am starting him on a NicoDerm patch at a lower dosage than what had been previously used.  I am still not starting any psychiatric medicine at this point hoping that I can get contact with his outpatient team to know when he last received a shot although if we cannot do that today I will go ahead and prescribe Seroquel at night to help with sleep and anxiety.  Patient not likely to need longer term hospitalization.  I have made a call to his cousin with whom he lives and have not been able to get through at this point.  Observation Level/Precautions:  15 minute checks  Laboratory:  UDS  Psychotherapy:    Medications:    Consultations:    Discharge Concerns:    Estimated LOS:  Other:     Physician Treatment Plan for Primary Diagnosis: Schizophrenia ([place]) Long Term Goal(s): Improvement in symptoms so as ready for discharge  Short Term Goals: Ability to verbalize feelings will improve  Physician Treatment Plan for Secondary Diagnosis: Principal Problem:   Schizophrenia ([place])  Long Term Goal(s): Improvement in symptoms so as ready for discharge  Short Term Goals: Compliance with prescribed medications will improve  I certify that inpatient services furnished can reasonably be expected to improve the patient's condition.    Alethia Berthold, MD 8/4/202012:50 PM

## 2018-08-10 NOTE — Plan of Care (Signed)
Pt. Has been complaint with medications and unit procedures. Pt. Denies si/hi/avh, able to contract for safety. Pt. Has not presented with psychotic features to this writer, but is observed child-like in his behavior and overall presentation.    Problem: Education: Goal: Mental status will improve Outcome: Progressing   Problem: Safety: Goal: Periods of time without injury will increase Outcome: Progressing   Problem: Education: Goal: Will be free of psychotic symptoms Outcome: Progressing   Problem: Health Behavior/Discharge Planning: Goal: Compliance with prescribed medication regimen will improve Outcome: Progressing

## 2018-08-10 NOTE — Progress Notes (Signed)
D: Pt during assessments denies si/hi/avh, able to contract for safety. Pt. Has not presented to this writer with psychotic features, but does appear very child-like.   A: Q x 15 minute observation checks in place completed for safety. Patient was provided with education.  Patient was given/offered medications per orders. Patient  was encourage to attend groups, participate in unit activities and continue with plan of care. Pt. Chart and plans of care reviewed. Pt. Given support and encouragement.   R: Patient is complaint with medication and unit procedures. Pt. Participates in groups and outdoor activities. Pt. Observed eating good. Pt. Interactions with staff and peers appropriate overall.

## 2018-08-10 NOTE — BHH Suicide Risk Assessment (Signed)
[facility]   Nursing information obtained from:  Patient, Review of record Demographic factors:  Male, Adolescent or young adult, Low socioeconomic status, Unemployed Current Mental Status:  NA(Denies) Loss Factors:  Financial problems / change in socioeconomic status, Legal issues, Loss of significant relationship Historical Factors:  NA(Denies) Risk Reduction Factors:  NA(Denies)  Total Time spent with patient: 1 hour Principal Problem: Schizophrenia (HCC) Diagnosis:  Principal Problem:   Schizophrenia (HCC)  Subjective Data: Patient seen and chart reviewed.  Patient is a young man with a history of schizophrenia and reportedly history of head injury.  In the hospital under IVC because of confusion.  Patient states that he has been frustrated at his home recently.  Repeats the comment that he had last time that his cousin and her family sometimes speak to him unkindly and insulting him.  Also clarifies that since moving to [place] he misses his friends and has little social life.  Patient admits to some chronic feeling of hearing the voice of Prudy FeelerSatan but seems not to make much of it.  Denies other voices.  Denies any suicidal or homicidal thoughts.  Indicates a willingness to be compliant with outpatient treatment.  Not currently threatening or aggressive  Continued Clinical Symptoms:  Alcohol Use Disorder Identification Test Final Score (AUDIT): 0 The "Alcohol Use Disorders Identification Test", Guidelines for Use in Primary Care, Second Edition.  World Science writerHealth Organization [facility](WHO). Score between 0-7:  no or low risk or alcohol related problems. Score between 8-15:  moderate risk of alcohol related problems. Score between 16-19:  high risk of alcohol related problems. Score 20 or above:  warrants further diagnostic evaluation for alcohol dependence and treatment.   CLINICAL FACTORS:   Schizophrenia:   Less than 626 years old   Musculoskeletal: Strength &  Muscle Tone: within normal limits Gait & Station: normal Patient leans: N/A  Psychiatric Specialty Exam: Physical Exam  Nursing note and vitals reviewed. Constitutional: He appears well-developed and well-nourished.  HENT:  Head: Normocephalic and atraumatic.  Eyes: Pupils are equal, round, and reactive to light. Conjunctivae are normal.  Neck: Normal range of motion.  Cardiovascular: Regular rhythm and normal heart sounds.  Respiratory: Effort normal.  GI: Soft.  Musculoskeletal: Normal range of motion.  Neurological: He is alert.  Skin: Skin is warm and dry.  Psychiatric: His speech is normal and behavior is normal. Thought content normal. His mood appears anxious. Cognition and memory are impaired. He expresses impulsivity.    Review of Systems  Constitutional: Negative.   HENT: Negative.   Eyes: Negative.   Respiratory: Negative.   Cardiovascular: Negative.   Gastrointestinal: Negative.   Musculoskeletal: Negative.   Skin: Negative.   Neurological: Negative.   Psychiatric/Behavioral: Positive for hallucinations. Negative for depression, memory loss, substance abuse and suicidal ideas. The patient is not nervous/anxious and does not have insomnia.     Blood pressure 122/75, pulse 81, temperature 99 F (37.2 C), temperature source Oral, resp. rate 17, height 5\' 7"  (1.702 m), weight 87.1 kg, SpO2 100 %.Body mass index is 30.07 kg/m.  General Appearance: Casual  Eye Contact:  Good  Speech:  Clear and Coherent  Volume:  Normal  Mood:  Anxious  Affect:  Congruent  Thought Process:  Goal Directed  Orientation:  Full (Time, Place, and Person)  Thought Content:  Logical  Suicidal Thoughts:  No  Homicidal Thoughts:  No  Memory:  Immediate;   Fair Recent;   Fair Remote;   Fair  Judgement:  Fair  Insight:  Fair  Psychomotor Activity:  Normal  Concentration:  Concentration: Fair  Recall:  [employer] of Knowledge:  Fair  Language:  Fair  Akathisia:  No  Handed:  Right   AIMS (if indicated):     Assets:  Desire for Improvement Housing Physical Health  ADL's:  Impaired  Cognition:  Impaired,  Mild  Sleep:  Number of Hours: 9      COGNITIVE FEATURES THAT CONTRIBUTE TO RISK:  None    SUICIDE RISK:   Minimal: No identifiable suicidal ideation.  Patients presenting with no risk factors but with morbid ruminations; may be classified as minimal risk based on the severity of the depressive symptoms  PLAN OF CARE: Patient admitted to psychiatric ward.  15-minute checks in place.  Medicines will be adjusted appropriately particularly once I can get some collateral information.  Patient will be engaged in groups and will meet with treatment team.  I certify that inpatient services furnished can reasonably be expected to improve the patient's condition.   Alethia Berthold, MD 08/10/2018, 12:46 PM

## 2018-08-11 MED ORDER — QUETIAPINE FUMARATE 200 MG PO TABS
200.0000 mg | ORAL_TABLET | Freq: Every day | ORAL | Status: DC
  Administered 2018-08-11: 200 mg via ORAL
  Filled 2018-08-11: qty 1

## 2018-08-11 NOTE — Progress Notes (Signed)
Patient alert and oriented x 2 with periods of confusion to place and situation, her thoughts are disorganized and incoherent ,she was noted pacing the unit, looked restless not interacting with peers and staff appropriately, she was offered support and encouragement, 15 minutes safety checks maintained will continue to monitor appropriately.

## 2018-08-11 NOTE — Progress Notes (Signed)
Recreation Therapy Notes  Date: 08/11/2018  Time: 9:30 am   Location: Craft room   Behavioral response: N/A   Intervention Topic: Values  Discussion/Intervention: Patient did not attend group.   Clinical Observations/Feedback:  Patient did not attend group.   Jethro Radke LRT/CTRS        Myles Mallicoat 08/11/2018 11:31 AM

## 2018-08-11 NOTE — Tx Team (Signed)
Interdisciplinary Treatment and Diagnostic Plan Update  08/11/2018 Time of Session: 230PM Kenneth Jensen MRN: [id number]  Principal Diagnosis: Schizophrenia [facility])  Secondary Diagnoses: Principal Problem:   Schizophrenia ([place])   Current Medications:  Current Facility-Administered Medications  Medication Dose Route Frequency Provider Last Rate Last Dose  . alum & mag hydroxide-simeth (MAALOX/MYLANTA) 200-200-20 MG/5ML suspension 30 mL  30 mL Oral Q4H PRN Patrecia Pour, NP      . divalproex (DEPAKOTE) DR tablet 500 mg  500 mg Oral Q12H Patrecia Pour, NP   500 mg at 08/11/18 1032  . magnesium hydroxide (MILK OF MAGNESIA) suspension 30 mL  30 mL Oral Daily PRN Patrecia Pour, NP      . nicotine (NICODERM CQ - dosed in mg/24 hours) patch 14 mg  14 mg Transdermal Daily Clapacs, Madie Reno, MD   14 mg at 08/11/18 1032  . QUEtiapine (SEROQUEL) tablet 100 mg  100 mg Oral QHS Clapacs, John T, MD   100 mg at 08/10/18 2158  . traZODone (DESYREL) tablet 50 mg  50 mg Oral QHS Patrecia Pour, NP   50 mg at 08/10/18 2159   PTA Medications: Medications Prior to Admission  Medication Sig Dispense Refill Last Dose  . CAPLYTA 42 MG CAPS Take 42 mg by mouth at bedtime.     Lorayne Bender SUSTENNA 234 MG/1.5ML SUSY injection INJECT 1.5MLS (234MG ) INTRAMUSCULARLY EVERY 2 WEEKS       Patient Stressors: Marital or family conflict Medication change or noncompliance Other: psychosis  Patient Strengths: Motivation for treatment/growth Physical Health  Treatment Modalities: Medication Management, Group therapy, Case management,  1 to 1 session with clinician, Psychoeducation, Recreational therapy.   Physician Treatment Plan for Primary Diagnosis: Schizophrenia ([place]) Long Term Goal(s): Improvement in symptoms so as ready for discharge Improvement in symptoms so as ready for discharge   Short Term Goals: Ability to verbalize feelings will improve Compliance with prescribed medications will  improve  Medication Management: Evaluate patient's response, side effects, and tolerance of medication regimen.  Therapeutic Interventions: 1 to 1 sessions, Unit Group sessions and Medication administration.  Evaluation of Outcomes: Progressing  Physician Treatment Plan for Secondary Diagnosis: Principal Problem:   Schizophrenia ([place])  Long Term Goal(s): Improvement in symptoms so as ready for discharge Improvement in symptoms so as ready for discharge   Short Term Goals: Ability to verbalize feelings will improve Compliance with prescribed medications will improve     Medication Management: Evaluate patient's response, side effects, and tolerance of medication regimen.  Therapeutic Interventions: 1 to 1 sessions, Unit Group sessions and Medication administration.  Evaluation of Outcomes: Progressing   RN Treatment Plan for Primary Diagnosis: Schizophrenia ([place]) Long Term Goal(s): Knowledge of disease and therapeutic regimen to maintain health will improve  Short Term Goals: Ability to participate in decision making will improve, Ability to verbalize feelings will improve, Ability to identify and develop effective coping behaviors will improve and Compliance with prescribed medications will improve  Medication Management: RN will administer medications as ordered by provider, will assess and evaluate patient's response and provide education to patient for prescribed medication. RN will report any adverse and/or side effects to prescribing provider.  Therapeutic Interventions: 1 on 1 counseling sessions, Psychoeducation, Medication administration, Evaluate responses to treatment, Monitor vital signs and CBGs as ordered, Perform/monitor CIWA, COWS, AIMS and Fall Risk screenings as ordered, Perform wound care treatments as ordered.  Evaluation of Outcomes: Progressing   LCSW Treatment Plan for Primary Diagnosis: Schizophrenia ([place]) Long Term  Goal(s): Safe transition to appropriate  next level of care at discharge, Engage patient in therapeutic group addressing interpersonal concerns.  Short Term Goals: Engage patient in aftercare planning with referrals and resources, Increase social support and Increase skills for wellness and recovery  Therapeutic Interventions: Assess for all discharge needs, 1 to 1 time with Social worker, Explore available resources and support systems, Assess for adequacy in community support network, Educate family and significant other(s) on suicide prevention, Complete Psychosocial Assessment, Interpersonal group therapy.  Evaluation of Outcomes: Progressing   Progress in Treatment: Attending groups: No. Participating in groups: No. Taking medication as prescribed: Yes. Toleration medication: Yes. Family/Significant other contact made: Yes, individual(s) contacted:  pts uncle Patient understands diagnosis: No. Discussing patient identified problems/goals with staff: Yes. Medical problems stabilized or resolved: Yes. Denies suicidal/homicidal ideation: Yes. Issues/concerns per patient self-inventory: No. Other: N/A  New problem(s) identified: No, Describe:  none  New Short Term/Long Term Goal(s): medication management for mood stabilization;  development of comprehensive mental wellness/sobriety plan.   Patient Goals:  "to get better and go to a group home in [place]"  Discharge Plan or Barriers: SPE pamphlet, Mobile Crisis information, and AA/NA information provided to patient for additional community support and resources. Pt's [place] ACT team [place] based out of [place], [place] [facility] and has started the process of referring pt to an ACT team in [place]. At this time pt will be discharging back to his cousin's house.  Reason for Continuation of Hospitalization: Medication stabilization  Estimated Length of Stay: 5-7 days  Attendees: Patient: Kenneth JoyChristopher Jensen 08/11/2018 3:29 PM  Physician: Dr Toni Amendlapacs MD 08/11/2018 3:29 PM   Nursing: Doyce Paraemetria Ravenell RN 08/11/2018 3:29 PM  RN Care Manager: 08/11/2018 3:29 PM  Social Worker: Zollie Scalelivia Thorsten Climer LCSW 08/11/2018 3:29 PM  Recreational Therapist: Danella DeisShay outlaw CTRS LRT 08/11/2018 3:29 PM  Other: Penni HomansMichaela Stanfield LCSW 08/11/2018 3:29 PM  Other:  08/11/2018 3:29 PM  Other: 08/11/2018 3:29 PM    Scribe for Treatment Team: Charlann Langelivia K Brnadon Eoff, LCSW 08/11/2018 3:29 PM

## 2018-08-11 NOTE — Plan of Care (Signed)
  Problem: Education: Goal: Will be free of psychotic symptoms Outcome: Not Progressing  Patient appears responding to internal stimuli.  

## 2018-08-11 NOTE — BHH Group Notes (Signed)
LCSW Group Therapy Note  08/11/2018 11:35 AM  Type of Therapy/Topic:  Group Therapy:  Emotion Regulation  Participation Level:  Did Not Attend   Description of Group:   The purpose of this group is to assist patients in learning to regulate negative emotions and experience positive emotions. Patients will be guided to discuss ways in which they have been vulnerable to their negative emotions. These vulnerabilities will be juxtaposed with experiences of positive emotions or situations, and patients will be challenged to use positive emotions to combat negative ones. Special emphasis will be placed on coping with negative emotions in conflict situations, and patients will process healthy conflict resolution skills.  Therapeutic Goals: 1. Patient will identify two positive emotions or experiences to reflect on in order to balance out negative emotions 2. Patient will label two or more emotions that they find the most difficult to experience 3. Patient will demonstrate positive conflict resolution skills through discussion and/or role plays  Summary of Patient Progress: x   Therapeutic Modalities:   Cognitive Behavioral Therapy Feelings Identification Dialectical Behavioral Therapy   Shalaina Guardiola, MSW, LCSW Clinical Social Work 08/11/2018 11:35 AM    

## 2018-08-11 NOTE — Progress Notes (Signed)
[facility] MD Progress Note  08/11/2018 4:42 PM Kenneth Jensen  MRN:  [id number] Subjective: Follow-up for this patient with schizophrenia.  He met with treatment team and individually.  Patient remains slightly pressured and intrusive although he has not been violent or threatening on the unit.  Insight limited.  He denies suicidal or homicidal ideation.  Denies current hallucinations.  Mostly seems to be able to interact with others without much behavior problem. Principal Problem: Schizophrenia ([place]) Diagnosis: Principal Problem:   Schizophrenia ([place])  Total Time spent with patient: 30 minutes  Past Psychiatric History: History of schizophrenia several prior hospitalizations.  Past Medical History:  Past Medical History:  Diagnosis Date  . Asthma   . Bipolar 1 disorder ([place])   . Depression   . Schizotaxia    History reviewed. No pertinent surgical history. Family History: History reviewed. No pertinent family history. Family Psychiatric  History: See previous Social History:  Social History   Substance and Sexual Activity  Alcohol Use Yes   Comment: socially     Social History   Substance and Sexual Activity  Drug Use Yes  . Types: Marijuana    Social History   Socioeconomic History  . Marital status: Single    Spouse name: Not on file  . Number of children: Not on file  . Years of education: Not on file  . Highest education level: Not on file  Occupational History  . Not on file  Social Needs  . Financial resource strain: Not on file  . Food insecurity    Worry: Not on file    Inability: Not on file  . Transportation needs    Medical: Not on file    Non-medical: Not on file  Tobacco Use  . Smoking status: Never Smoker  . Smokeless tobacco: Never Used  Substance and Sexual Activity  . Alcohol use: Yes    Comment: socially  . Drug use: Yes    Types: Marijuana  . Sexual activity: Not on file  Lifestyle  . Physical activity    Days per week: Not on file   Minutes per session: Not on file  . Stress: Not on file  Relationships  . Social Herbalist on phone: Not on file    Gets together: Not on file    Attends religious service: Not on file    Active member of club or organization: Not on file    Attends meetings of clubs or organizations: Not on file    Relationship status: Not on file  Other Topics Concern  . Not on file  Social History Narrative  . Not on file   Additional Social History:                         Sleep: Fair  Appetite:  Fair  Current Medications: Current Facility-Administered Medications  Medication Dose Route Frequency Provider Last Rate Last Dose  . alum & mag hydroxide-simeth (MAALOX/MYLANTA) 200-200-20 MG/5ML suspension 30 mL  30 mL Oral Q4H PRN Patrecia Pour, NP      . divalproex (DEPAKOTE) DR tablet 500 mg  500 mg Oral Q12H Patrecia Pour, NP   500 mg at 08/11/18 1032  . magnesium hydroxide (MILK OF MAGNESIA) suspension 30 mL  30 mL Oral Daily PRN Patrecia Pour, NP      . nicotine (NICODERM CQ - dosed in mg/24 hours) patch 14 mg  14 mg Transdermal Daily Clapacs, John T,  MD   14 mg at 08/11/18 1032  . QUEtiapine (SEROQUEL) tablet 200 mg  200 mg Oral QHS Clapacs, John T, MD      . traZODone (DESYREL) tablet 50 mg  50 mg Oral QHS Patrecia Pour, NP   50 mg at 08/10/18 2159    Lab Results: No results found for this or any previous visit (from the past 48 hour(s)).  Blood Alcohol level:  Lab Results  Component Value Date   ETH <10 08/09/2018   ETH <10 43/32/9518    Metabolic Disorder Labs: No results found for: HGBA1C, MPG No results found for: PROLACTIN Lab Results  Component Value Date   CHOL 134 08/09/2018   TRIG 99 08/09/2018   HDL 39 (L) 08/09/2018   CHOLHDL 3.4 08/09/2018   VLDL 20 08/09/2018   LDLCALC 75 08/09/2018    Physical Findings: AIMS:  , ,  ,  ,    CIWA:    COWS:     Musculoskeletal: Strength & Muscle Tone: within normal limits Gait & Station:  normal Patient leans: N/A  Psychiatric Specialty Exam: Physical Exam  Nursing note and vitals reviewed. Constitutional: He appears well-developed and well-nourished.  HENT:  Head: Normocephalic and atraumatic.  Eyes: Pupils are equal, round, and reactive to light. Conjunctivae are normal.  Neck: Normal range of motion.  Cardiovascular: Regular rhythm and normal heart sounds.  Respiratory: Effort normal.  GI: Soft.  Musculoskeletal: Normal range of motion.  Neurological: He is alert.  Skin: Skin is warm and dry.  Psychiatric: His affect is labile. His speech is rapid and/or pressured and slurred. He is agitated. He is not aggressive. Thought content is paranoid. Cognition and memory are impaired. He expresses inappropriate judgment. He expresses no homicidal and no suicidal ideation.    Review of Systems  Constitutional: Negative.   HENT: Negative.   Eyes: Negative.   Respiratory: Negative.   Cardiovascular: Negative.   Gastrointestinal: Negative.   Musculoskeletal: Negative.   Skin: Negative.   Neurological: Negative.   Psychiatric/Behavioral: Negative.     Blood pressure 115/75, pulse 81, temperature 98.7 F (37.1 C), temperature source Oral, resp. rate 16, height '5\' 7"'$  (1.702 m), weight 87.1 kg, SpO2 100 %.Body mass index is 30.07 kg/m.  General Appearance: Casual  Eye Contact:  Fair  Speech:  Garbled  Volume:  Decreased  Mood:  Anxious and Dysphoric  Affect:  Congruent  Thought Process:  Disorganized  Orientation:  Full (Time, Place, and Person)  Thought Content:  Illogical, Paranoid Ideation and Rumination  Suicidal Thoughts:  No  Homicidal Thoughts:  No  Memory:  Immediate;   Fair Recent;   Fair Remote;   Fair  Judgement:  Impaired  Insight:  Shallow  Psychomotor Activity:  Normal  Concentration:  Concentration: Fair  Recall:  [employer] of Knowledge:  Fair  Language:  Fair  Akathisia:  No  Handed:  Right  AIMS (if indicated):     Assets:  Desire for  Improvement  ADL's:  Intact  Cognition:  Impaired,  Mild  Sleep:  Number of Hours: 6.75     Treatment Plan Summary: Daily contact with patient to assess and evaluate symptoms and progress in treatment, Medication management and Plan Tolerated Seroquel.  Certainly not oversedated.  Increased dose to 200 mg at night.  Supportive therapy and encouragement to continue to attend groups.  We will work with his family about discharge planning and work on trying to find referral to local act team.  Alethia Berthold, MD 08/11/2018, 4:42 PM

## 2018-08-11 NOTE — Plan of Care (Signed)
D- Patient alert and oriented. Patient presented in a pleasant mood on assessment reporting that he slept "good" last night, however, he "had a bad dream" that woke him up. Patient denied SI, HI, AVH, and pain at this time. Patient also denied any signs/symptoms of depression, but he stated to this writer that he was anxious because "I'm ready to go". Patient's goal for today is to "take it easy".  A- Scheduled medications administered to patient, per MD orders. Support and encouragement provided.  Routine safety checks conducted every 15 minutes.  Patient informed to notify staff with problems or concerns.  R- No adverse drug reactions noted. Patient contracts for safety at this time. Patient compliant with medications and treatment plan. Patient receptive, calm, and cooperative. Patient interacts well with others on the unit.  Patient remains safe at this time.  Problem: Education: Goal: Mental status will improve Outcome: Progressing   Problem: Safety: Goal: Periods of time without injury will increase Outcome: Progressing   Problem: Education: Goal: Will be free of psychotic symptoms Outcome: Progressing Goal: Knowledge of the prescribed therapeutic regimen will improve Outcome: Progressing   Problem: Health Behavior/Discharge Planning: Goal: Compliance with prescribed medication regimen will improve Outcome: Progressing   Outcome: Progressing

## 2018-08-12 LAB — VALPROIC ACID LEVEL: Valproic Acid Lvl: 75 ug/mL (ref 50.0–100.0)

## 2018-08-12 MED ORDER — QUETIAPINE FUMARATE 200 MG PO TABS
300.0000 mg | ORAL_TABLET | Freq: Every day | ORAL | Status: DC
  Administered 2018-08-12 – 2018-08-16 (×5): 300 mg via ORAL
  Filled 2018-08-12 (×5): qty 1

## 2018-08-12 MED ORDER — DIVALPROEX SODIUM 500 MG PO DR TAB: 1000.0000 mg | DELAYED_RELEASE_TABLET | Freq: Every day | ORAL | Status: DC

## 2018-08-12 NOTE — Plan of Care (Signed)
D- Patient alert and oriented. Patient presented in a pleasant mood on assessment stating that he slept good last night, however, he is still tired. Patient denied SI, HI, AVH, and pain at this time. Patient also denied any signs/symptoms of depression and anxiety. Patient's goal for today is to "get closer to leaving".  A- Patient still has not taken his scheduled medications at this time. Support and encouragement provided.  Routine safety checks conducted every 15 minutes.  Patient informed to notify staff with problems or concerns.  R- No adverse drug reactions noted. Patient contracts for safety at this time. Patient compliant with medications and treatment plan. Patient receptive, calm, and cooperative. Patient interacts well with others on the unit.  Patient remains safe at this time.  Problem: Education: Goal: Mental status will improve Outcome: Progressing   Problem: Safety: Goal: Periods of time without injury will increase Outcome: Progressing   Problem: Education: Goal: Will be free of psychotic symptoms Outcome: Progressing Goal: Knowledge of the prescribed therapeutic regimen will improve Outcome: Progressing   Problem: Health Behavior/Discharge Planning: Goal: Compliance with prescribed medication regimen will improve Outcome: Progressing

## 2018-08-12 NOTE — Progress Notes (Signed)
D - Patient was in his room upon arrival to the unit. Patient was pleasant during assessment and medication administration. Patient denies SI/HI/AVH, pain and depression. Patient is wanting to get his medication right so he can be discharged.   A - Patient compliant with medication administration per MD orders and procedures on the unit. Patient given education. Patient given support and encouragement to be active in his treatment plan. Patient informed to let staff know if there are any issues or problems on the unit.   R - Patient being monitored Q 15 minutes for safety per unit protocol. Patient remains safe on the unit.

## 2018-08-12 NOTE — Plan of Care (Signed)
Patient presents without psychotic symptoms. Patient denies SI/HI/AVH   Problem: Education: Goal: Will be free of psychotic symptoms Outcome: Progressing

## 2018-08-12 NOTE — Progress Notes (Signed)
Patient states to this writer that he's still tired and "I'll take my medicine when I get up".

## 2018-08-12 NOTE — Progress Notes (Signed)
D - Patient was in his room upon arrival to the unit. Patient was pleasant during assessment and medication administration. Patient denies SI/HI/AVH, pain and depression. Patient stated he had a anxiety level of 2/10 because he was still here and feels ready to go. Patient given education.   A - Patient compliant with medication administration per MD orders and procedures on the unit. Patient given education. Patient given support and encouragement to be active in his treatment plan. Patient informed to let staff know if there are any issues or problems on the unit.   R - Patient being monitored Q 15 minutes for safety per unit protocol. Patient remains safe on the unit.

## 2018-08-12 NOTE — Plan of Care (Signed)
Patient compliant with medication administration per MD orders. Patient was given education about his medications.   Problem: Education: Goal: Knowledge of the prescribed therapeutic regimen will improve Outcome: Progressing

## 2018-08-12 NOTE — Progress Notes (Signed)
[facility]  08/12/2018 5:25 PM Kenneth Jensen  MRN:  [id number] Subjective: Patient seen chart reviewed.  Follow-up for this patient with schizophrenia.  He remains somewhat agitated disorganized paranoid and admits to hallucinations.  He was very sedated this morning.  More up and around this afternoon.  Not aggressive or violent to anyone. Principal Problem: Schizophrenia (HCC) Diagnosis: Principal Problem:   Schizophrenia (HCC)  Total Time spent with patient: 30 minutes  Past Psychiatric History: Past history of schizophrenia  Past Medical History:  Past Medical History:  Diagnosis Date  . Asthma   . Bipolar 1 disorder (HCC)   . Depression   . Schizotaxia    History reviewed. No pertinent surgical history. Family History: History reviewed. No pertinent family history. Family Psychiatric  History: See previous Social History:  Social History   Substance and Sexual Activity  Alcohol Use Yes   Comment: socially     Social History   Substance and Sexual Activity  Drug Use Yes  . Types: Marijuana    Social History   Socioeconomic History  . Marital status: Single    Spouse name: Not on file  . Number of children: Not on file  . Years of education: Not on file  . Highest education level: Not on file  Occupational History  . Not on file  Social Needs  . Financial resource strain: Not on file  . Food insecurity    Worry: Not on file    Inability: Not on file  . Transportation needs    Medical: Not on file    Non-medical: Not on file  Tobacco Use  . Smoking status: Never Smoker  . Smokeless tobacco: Never Used  Substance and Sexual Activity  . Alcohol use: Yes    Comment: socially  . Drug use: Yes    Types: Marijuana  . Sexual activity: Not on file  Lifestyle  . Physical activity    Days per week: Not on file    Minutes per session: Not on file  . Stress: Not on file  Relationships  . Social Musicianconnections    Talks on phone: Not on file     Gets together: Not on file    Attends religious service: Not on file    Active member of club or organization: Not on file    Attends meetings of clubs or organizations: Not on file    Relationship status: Not on file  Other Topics Concern  . Not on file  Social History Narrative  . Not on file   Additional Social History:                         Sleep: Fair  Appetite:  Fair  Current Medications: Current Facility-Administered Medications  Medication Dose Route Frequency Provider Last Rate Last Dose  . alum & mag hydroxide-simeth (MAALOX/MYLANTA) 200-200-20 MG/5ML suspension 30 mL  30 mL Oral Q4H PRN Charm RingsLord, Jamison Y, NP      . Melene Muller[START ON 08/13/2018] divalproex (DEPAKOTE) DR tablet 1,000 mg  1,000 mg Oral QHS Marja Adderley T, MD      . magnesium hydroxide (MILK OF MAGNESIA) suspension 30 mL  30 mL Oral Daily PRN Charm RingsLord, Jamison Y, NP      . nicotine (NICODERM CQ - dosed in mg/24 hours) patch 14 mg  14 mg Transdermal Daily Berdena Cisek, Jackquline DenmarkJohn T, MD   14 mg at 08/12/18 1527  . QUEtiapine (SEROQUEL) tablet 300 mg  300  mg Oral QHS Xee Hollman T, MD      . traZODone (DESYREL) tablet 50 mg  50 mg Oral QHS Patrecia Pour, NP   50 mg at 08/11/18 2127    Lab Results:  Results for orders placed or performed during the hospital encounter of 08/09/18 (from the past 48 hour(s))  Valproic acid level     Status: None   Collection Time: 08/12/18  7:49 AM  Result Value Ref Range   Valproic Acid Lvl 75 50.0 - 100.0 ug/mL    Comment: Performed at [facility], [address], [place], [place] [address]    Blood Alcohol level:  Lab Results  Component Value Date   [facility] <10 08/09/2018   ETH <10 72/53/6644    Metabolic Disorder Labs: No results found for: HGBA1C, MPG No results found for: PROLACTIN Lab Results  Component Value Date   CHOL 134 08/09/2018   TRIG 99 08/09/2018   HDL 39 (L) 08/09/2018   CHOLHDL 3.4 08/09/2018   VLDL 20 08/09/2018   LDLCALC 75 08/09/2018     Physical Findings: AIMS:  , ,  ,  ,    CIWA:    COWS:     Musculoskeletal: Strength & Muscle Tone: within normal limits Gait & Station: normal Patient leans: N/A  Psychiatric Specialty Exam: Physical Exam  Nursing note and vitals reviewed. Constitutional: He appears well-developed and well-nourished.  HENT:  Head: Normocephalic and atraumatic.  Eyes: Pupils are equal, round, and reactive to light. Conjunctivae are normal.  Neck: Normal range of motion.  Cardiovascular: Regular rhythm and normal heart sounds.  Respiratory: Effort normal.  GI: Soft.  Musculoskeletal: Normal range of motion.  Neurological: He is alert.  Skin: Skin is warm and dry.  Psychiatric: His affect is blunt. His speech is delayed. He is slowed. Cognition and memory are impaired. He expresses impulsivity. He expresses no homicidal and no suicidal ideation.    Review of Systems  Constitutional: Negative.   HENT: Negative.   Eyes: Negative.   Respiratory: Negative.   Cardiovascular: Negative.   Gastrointestinal: Negative.   Musculoskeletal: Negative.   Skin: Negative.   Neurological: Negative.   Psychiatric/Behavioral: Negative for depression and suicidal ideas.    Blood pressure 115/75, pulse 81, temperature 98.7 F (37.1 C), temperature source Oral, resp. rate 16, height 5\' 7"  (1.702 m), weight 87.1 kg, SpO2 100 %.Body mass index is 30.07 kg/m.  General Appearance: Disheveled  Eye Contact:  Fair  Speech:  Garbled, Slow and Slurred  Volume:  Decreased  Mood:  Dysphoric  Affect:  Congruent  Thought Process:  Disorganized  Orientation:  Full (Time, Place, and Person)  Thought Content:  Illogical  Suicidal Thoughts:  No  Homicidal Thoughts:  No  Memory:  Immediate;   Fair Recent;   Fair Remote;   Fair  Judgement:  Impaired  Insight:  Shallow  Psychomotor Activity:  Decreased  Concentration:  Concentration: Poor  Recall:  [employer] of Knowledge:  Fair  Language:  Fair  Akathisia:  No   Handed:  Right  AIMS (if indicated):     Assets:  Desire for Improvement  ADL's:  Impaired  Cognition:  Impaired,  Mild  Sleep:  Number of Hours: 7.5     Treatment Plan Summary: Daily contact with patient to assess and evaluate symptoms and progress in treatment, Medication management and Plan Increase Seroquel to 300 mg at night.  Continue other medication although change Depakote 2000 mg at night rather  than 500 twice a day.  Reassess tomorrow and continue looking for appropriate outpatient referral  Mordecai RasmussenJohn Ellsie Violette, MD 08/12/2018, 5:25 PM

## 2018-08-12 NOTE — BHH Group Notes (Addendum)
LCSW Group Therapy Note  08/12/2018 1:00 PM  Type of Therapy/Topic:  Group Therapy:  Balance in Life  Participation Level:  Active  Description of Group:    This group will address the concept of balance and how it feels and looks when one is unbalanced. Patients will be encouraged to process areas in their lives that are out of balance and identify reasons for remaining unbalanced. Facilitators will guide patients in utilizing problem-solving interventions to address and correct the stressor making their life unbalanced. Understanding and applying boundaries will be explored and addressed for obtaining and maintaining a balanced life. Patients will be encouraged to explore ways to assertively make their unbalanced needs known to significant others in their lives, using other group members and facilitator for support and feedback.  Therapeutic Goals: 1. Patient will identify two or more emotions or situations they have that consume much of in their lives. 2. Patient will identify signs/triggers that life has become out of balance:  3. Patient will identify two ways to set boundaries in order to achieve balance in their lives:  4. Patient will demonstrate ability to communicate their needs through discussion and/or role plays  Summary of Patient Progress: Patient was present in group.  Patient appeared attentive throughout group.  Patient shared that when things feel unbalanced it causes "headaches".  Patient shared that when things are balanced in his life "there is less arguing". Patient shared that he watches TV and music to help cope.    Therapeutic Modalities:   Cognitive Behavioral Therapy Solution-Focused Therapy Assertiveness Training  Assunta Curtis MSW, LCSW 08/12/2018 11:46 AM

## 2018-08-12 NOTE — Progress Notes (Signed)
Patient just took his scheduled morning medications from this Probation officer.

## 2018-08-13 LAB — DRUG SCREEN 764883 11+OXYCO+ALC+CRT-BUND
Amphetamines, Urine: NEGATIVE ng/mL
BENZODIAZ UR QL: NEGATIVE ng/mL
Barbiturate: NEGATIVE ng/mL
Cocaine (Metabolite): NEGATIVE ng/mL
Creatinine: 95.1 mg/dL (ref 20.0–300.0)
Ethanol: NEGATIVE %
Meperidine: NEGATIVE ng/mL
Methadone Screen, Urine: NEGATIVE ng/mL
OPIATE SCREEN URINE: NEGATIVE ng/mL
Oxycodone/Oxymorphone, Urine: NEGATIVE ng/mL
Phencyclidine: NEGATIVE ng/mL
Propoxyphene, Urine: NEGATIVE ng/mL
Tramadol: NEGATIVE ng/mL
pH, Urine: 6.1 (ref 4.5–8.9)

## 2018-08-13 LAB — CANNABINOID CONFIRMATION, UR
CANNABINOIDS: POSITIVE — AB
Carboxy THC GC/MS Conf: 86 ng/mL

## 2018-08-13 MED ORDER — PALIPERIDONE PALMITATE ER 234 MG/1.5ML IM SUSY
234.0000 mg | PREFILLED_SYRINGE | Freq: Once | INTRAMUSCULAR | Status: AC
  Administered 2018-08-13: 234 mg via INTRAMUSCULAR
  Filled 2018-08-13: qty 1.5

## 2018-08-13 MED ORDER — DIVALPROEX SODIUM 500 MG PO DR TAB
1500.0000 mg | DELAYED_RELEASE_TABLET | Freq: Every day | ORAL | Status: DC
  Administered 2018-08-13 – 2018-08-16 (×4): 1500 mg via ORAL
  Filled 2018-08-13 (×4): qty 3

## 2018-08-13 NOTE — B[facility] Group Notes (Signed)
LCSW Group Therapy Note  08/13/2018 11:30 AM  Type of Therapy and Topic:  Group Therapy:  Feelings around Relapse and Recovery  Participation Level:  Active   Description of Group:    Patients in this group will discuss emotions they experience before and after a relapse. They will process how experiencing these feelings, or avoidance of experiencing them, relates to having a relapse. Facilitator will guide patients to explore emotions they have related to recovery. Patients will be encouraged to process which emotions are more powerful. They will be guided to discuss the emotional reaction significant others in their lives may have to their relapse or recovery. Patients will be assisted in exploring ways to respond to the emotions of others without this contributing to a relapse.  Therapeutic Goals: 1. Patient will identify two or more emotions that lead to a relapse for them 2. Patient will identify two emotions that result when they relapse 3. Patient will identify two emotions related to recovery 4. Patient will demonstrate ability to communicate their needs through discussion and/or role plays   Summary of Patient Progress: Pt was present in group and was actively speaking, but was not on topic majority of the time. Pt used group to vent about his family dynamic and how he feels about being in the hospital. Pt reported that because he is labeled schizophrenic, they always send him to the unit when he comes to the hospital for medical reasons. Pt discussed having a strained relationship with his mother and how she is not a good support for him. Pt also discussed that "they trying to send me to a group home, I don't want to go to no group home".    Therapeutic Modalities:   Cognitive Behavioral Therapy Solution-Focused Therapy Assertiveness Training Relapse Prevention Therapy   Evalina Field, MSW, LCSW Clinical Social Work 08/13/2018 11:30 AM
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
## 2018-08-13 NOTE — Plan of Care (Signed)
Patient is appropriate in the unit.Patient stated that he is hearing voices at times but it is not bothering him.Denies SI and HI.Patient stated that he is going to ignore the people who are insulting him.Compliant with medications.Appetite and energy level good.Attended groups.Support and encouragement given.

## 2018-08-13 NOTE — BHH Counselor (Signed)
CSW spoke with the patients current ACT team out of [facility] to check on the status of the referrals that had been sent by them.  She reports that referrals were sent to [facility] and [facility].  She reports that [facility] was full.  She reports that they were waiting on the patient's Medicaid to be transferred and now that that process has been started they can follow up on those referrals.  She reports that the team will continue with the patient until new team is able to start.    CSW checked in on the patient's shot schedule.  CSW was informed that patient gets [place] 234 every 2 weeks and last injection was on 7/20.  She reports that patient is due.  She reports that if the hospital does not have the medication it is possible to have staff drop the medication off.   Assunta Curtis, MSW, LCSW 08/13/2018 11:02 AM

## 2018-08-13 NOTE — Progress Notes (Signed)
CSW attempted to speak with pts cousin Farrel Gordon, the phone rang then stated "your call could not be completed at this time".  Weekend CSW will attempt again tomorrow.  Evalina Field, MSW, LCSW Clinical Social Work 08/13/2018 3:27 PM

## 2018-08-13 NOTE — Progress Notes (Signed)
Patient is calm and cooperating with ADLs , and compliant with medications and therapeutic regimen, patient continue to express seeing shadows and moving objects, education is provided to use his coping skills , support and encouragement is provided , patient denies any SI/HI, patient described poor night sleep as not continuous and not certified. Patient has Trazodone on board for sleep there is no distress.

## 2018-08-13 NOTE — Progress Notes (Signed)
[facility]  08/13/2018 2:06 PM Kenneth Jensen  MRN:  [id number] Subjective: Patient continues to be focused on discharge planning.  Acknowledges hallucinations but does not feel disturbed by them.  Affect is a little agitated and irritable but not threatening.  We did confirm today apparently that he was due to get his long[place]acting [place] which he gets at the higher dose every 2 weeks.  Spoke with his cousin today.  She is very concerned that he does not yet have arrangements made for an act team in the community Principal Problem: Schizophrenia (HCC) Diagnosis: Principal Problem:   Schizophrenia (HCC)  Total Time spent with patient: 30 minutes  Past Psychiatric History: Patient has a history of schizophrenia prior regular follow-up with act team  Past Medical History:  Past Medical History:  Diagnosis Date  . Asthma   . Bipolar 1 disorder (HCC)   . Depression   . Schizotaxia    History reviewed. No pertinent surgical history. Family History: History reviewed. No pertinent family history. Family Psychiatric  History: See previous Social History:  Social History   Substance and Sexual Activity  Alcohol Use Yes   Comment: socially     Social History   Substance and Sexual Activity  Drug Use Yes  . Types: Marijuana    Social History   Socioeconomic History  . Marital status: Single    Spouse name: Not on file  . Number of children: Not on file  . Years of education: Not on file  . Highest education level: Not on file  Occupational History  . Not on file  Social Needs  . Financial resource strain: Not on file  . Food insecurity    Worry: Not on file    Inability: Not on file  . Transportation needs    Medical: Not on file    Non-medical: Not on file  Tobacco Use  . Smoking status: Never Smoker  . Smokeless tobacco: Never Used  Substance and Sexual Activity  . Alcohol use: Yes    Comment: socially  . Drug use: Yes    Types: Marijuana  .  Sexual activity: Not on file  Lifestyle  . Physical activity    Days per week: Not on file    Minutes per session: Not on file  . Stress: Not on file  Relationships  . Social Musicianconnections    Talks on phone: Not on file    Gets together: Not on file    Attends religious service: Not on file    Active member of club or organization: Not on file    Attends meetings of clubs or organizations: Not on file    Relationship status: Not on file  Other Topics Concern  . Not on file  Social History Narrative  . Not on file   Additional Social History:                         Sleep: Fair  Appetite:  Fair  Current Medications: Current Facility-Administered Medications  Medication Dose Route Frequency Provider Last Rate Last Dose  . alum & mag hydroxide-simeth (MAALOX/MYLANTA) 200-200-20 MG/5ML suspension 30 mL  30 mL Oral Q4H PRN Charm RingsLord, Jamison Y, NP      . divalproex (DEPAKOTE) DR tablet 1,000 mg  1,000 mg Oral QHS ,  T, MD      . magnesium hydroxide (MILK OF MAGNESIA) suspension 30 mL  30 mL Oral Daily PRN Nanine MeansLord, Jamison  Y, NP      . nicotine (NICODERM CQ - dosed in mg/24 hours) patch 14 mg  14 mg Transdermal Daily , Jackquline DenmarkJohn T, MD   14 mg at 08/13/18 0813  . QUEtiapine (SEROQUEL) tablet 300 mg  300 mg Oral QHS , Jackquline DenmarkJohn T, MD   300 mg at 08/12/18 2107  . traZODone (DESYREL) tablet 50 mg  50 mg Oral QHS Charm RingsLord, Jamison Y, NP   50 mg at 08/12/18 2107    Lab Results:  Results for orders placed or performed during the hospital encounter of 08/09/18 (from the past 48 hour(s))  Valproic acid level     Status: None   Collection Time: 08/12/18  7:49 AM  Result Value Ref Range   Valproic Acid Lvl 75 50.0 - 100.0 ug/mL    Comment: Performed at [facility], [address] [place], [place] [address]    Blood Alcohol level:  Lab Results  Component Value Date   [facility] 08/[facility]   ETH <10 08/02/2018    Metabolic Disorder Labs: No results found for:  HGBA1C, MPG No results found for: PROLACTIN Lab Results  Component Value Date   CHOL 134 08/09/2018   TRIG 99 08/09/2018   HDL 39 (L) 08/09/2018   CHOLHDL 3.4 08/09/2018   VLDL 20 08/09/2018   LDLCALC 75 08/09/2018    Physical Findings: AIMS:  , ,  ,  ,    CIWA:    COWS:     Musculoskeletal: Strength & Muscle Tone: within normal limits Gait & Station: normal Patient leans: N/A  Psychiatric Specialty Exam: Physical Exam  Nursing note and vitals reviewed. Constitutional: He appears well-developed and well-nourished.  HENT:  Head: Normocephalic and atraumatic.  Eyes: Pupils are equal, round, and reactive to light. Conjunctivae are normal.  Neck: Normal range of motion.  Cardiovascular: Regular rhythm and normal heart sounds.  Respiratory: Effort normal.  GI: Soft.  Musculoskeletal: Normal range of motion.  Neurological: He is alert.  Skin: Skin is warm and dry.  Psychiatric: His mood appears anxious. His speech is tangential. He is agitated. He is not aggressive. Thought content is paranoid. Cognition and memory are impaired. He expresses impulsivity. He expresses no homicidal and no suicidal ideation.    Review of Systems  Constitutional: Negative.   HENT: Negative.   Eyes: Negative.   Respiratory: Negative.   Cardiovascular: Negative.   Gastrointestinal: Negative.   Musculoskeletal: Negative.   Skin: Negative.   Neurological: Negative.   Psychiatric/Behavioral: Positive for hallucinations. Negative for depression, memory loss, substance abuse and suicidal ideas. The patient is not nervous/anxious and does not have insomnia.     Blood pressure 115/75, pulse 81, temperature 98.7 F (37.1 C), temperature source Oral, resp. rate 16, height 5\' 7"  (1.702 m), weight 87.1 kg, SpO2 100 %.Body mass index is 30.07 kg/m.  General Appearance: Casual  Eye Contact:  Fair  Speech:  Garbled  Volume:  Normal  Mood:  Irritable  Affect:  Congruent  Thought Process:  Disorganized   Orientation:  Full (Time, Place, and Person)  Thought Content:  Illogical and Hallucinations: Auditory  Suicidal Thoughts:  No  Homicidal Thoughts:  No  Memory:  Immediate;   Fair Recent;   Fair Remote;   Fair  Judgement:  Impaired  Insight:  Shallow  Psychomotor Activity:  Decreased  Concentration:  Concentration: Fair  Recall:  [employer]  Fund of Knowledge:  Fair  Language:  Fair  Akathisia:  No  Handed:  Right  AIMS (if indicated):     Assets:  Desire for Improvement Housing Physical Health Resilience  ADL's:  Intact  Cognition:  Impaired,  Mild  Sleep:  Number of Hours: 7     Treatment Plan Summary: Daily contact with patient to assess and evaluate symptoms and progress in treatment, Medication management and Plan Patient appears to be fairly intrusive and a little hyperactive.  We will check a Depakote level over the weekend.  We need to get him set up with outpatient follow-up with an act team prior to discharge.  No other change to medicine for now.  Tried to soothe the patient but he remains irate although not threatening any violence.  Generally redirectable and cooperative.  Alethia Berthold, MD 08/13/2018, 2:06 PM

## 2018-08-14 NOTE — Progress Notes (Signed)
[facility] MD Progress Note  08/14/2018 10:45 AM Kenneth Jensen  MRN:  [id number] Subjective: Patient is a 28 year old male with a past psychiatric history significant for schizophrenia admitted on 08/10/2018 secondary to disorganization and paranoia.  Objective: Patient is seen and examined.  Patient is a 28 year old male with the above-stated past psychiatric history who is seen in follow-up.  The patient stated that he was fine.  He seem to be very other apprehensive and somewhat paranoid.  He is pacing a great deal of the day.  Review of the electronic medical record showed that he did acknowledge hallucinations yesterday, but did not feel disturbed by them.  He was somewhat agitated and irritable as well.  He is reportedly on the long-acting paliperidone injection, and apparently is on a fairly high dose which he receives every 2 weeks.  He received the paliperidone long-acting injection 234 mg on 08/13/2018.  He is also on Seroquel 300 mg p.o. nightly, Depakote DR 1500 mg p.o. nightly and trazodone 50 mg p.o. nightly.  He stated that the auditory hallucinations continued not to bother him, but did seem to be agitated and somewhat paranoid and guarded.  Review of the electronic medical record revealed his potassium is slightly low at 3.4, a mildly elevated white blood cell count 11.6, Depakote level was 75 on admission, normal TSH, and his drug screen was positive for marijuana.  His vital signs are stable, he is afebrile.  He slept 7.5 hours last night.  Principal Problem: Schizophrenia ([place]) Diagnosis: Principal Problem:   Schizophrenia ([place])  Total Time spent with patient: 20 minutes  Past Psychiatric History: See admission H&P  Past Medical History:  Past Medical History:  Diagnosis Date  . Asthma   . Bipolar 1 disorder ([place])   . Depression   . Schizotaxia    History reviewed. No pertinent surgical history. Family History: History reviewed. No pertinent family history. Family Psychiatric   History: See admission H&P Social History:  Social History   Substance and Sexual Activity  Alcohol Use Yes   Comment: socially     Social History   Substance and Sexual Activity  Drug Use Yes  . Types: Marijuana    Social History   Socioeconomic History  . Marital status: Single    Spouse name: Not on file  . Number of children: Not on file  . Years of education: Not on file  . Highest education level: Not on file  Occupational History  . Not on file  Social Needs  . Financial resource strain: Not on file  . Food insecurity    Worry: Not on file    Inability: Not on file  . Transportation needs    Medical: Not on file    Non-medical: Not on file  Tobacco Use  . Smoking status: Never Smoker  . Smokeless tobacco: Never Used  Substance and Sexual Activity  . Alcohol use: Yes    Comment: socially  . Drug use: Yes    Types: Marijuana  . Sexual activity: Not on file  Lifestyle  . Physical activity    Days per week: Not on file    Minutes per session: Not on file  . Stress: Not on file  Relationships  . Social Herbalist on phone: Not on file    Gets together: Not on file    Attends religious service: Not on file    Active member of club or organization: Not on file    Attends meetings  of clubs or organizations: Not on file    Relationship status: Not on file  Other Topics Concern  . Not on file  Social History Narrative  . Not on file   Additional Social History:                         Sleep: Good  Appetite:  Good  Current Medications: Current Facility-Administered Medications  Medication Dose Route Frequency Provider Last Rate Last Dose  . alum & mag hydroxide-simeth (MAALOX/MYLANTA) 200-200-20 MG/5ML suspension 30 mL  30 mL Oral Q4H PRN Charm RingsLord, Jamison Y, NP      . divalproex (DEPAKOTE) DR tablet 1,500 mg  1,500 mg Oral QHS Clapacs, Jackquline DenmarkJohn T, MD   1,500 mg at 08/13/18 2123  . magnesium hydroxide (MILK OF MAGNESIA) suspension 30 mL   30 mL Oral Daily PRN Charm RingsLord, Jamison Y, NP      . nicotine (NICODERM CQ - dosed in mg/24 hours) patch 14 mg  14 mg Transdermal Daily Clapacs, Jackquline DenmarkJohn T, MD   Stopped at 08/14/18 1000  . QUEtiapine (SEROQUEL) tablet 300 mg  300 mg Oral QHS Clapacs, John T, MD   300 mg at 08/13/18 2123  . traZODone (DESYREL) tablet 50 mg  50 mg Oral QHS Charm RingsLord, Jamison Y, NP   50 mg at 08/13/18 2123    Lab Results: No results found for this or any previous visit (from the past 48 hour(s)).  Blood Alcohol level:  Lab Results  Component Value Date   ETH <10 08/09/2018   ETH <10 08/02/2018    Metabolic Disorder Labs: No results found for: HGBA1C, MPG No results found for: PROLACTIN Lab Results  Component Value Date   CHOL 134 08/09/2018   TRIG 99 08/09/2018   HDL 39 (L) 08/09/2018   CHOLHDL 3.4 08/09/2018   VLDL 20 08/09/2018   LDLCALC 75 08/09/2018    Physical Findings: AIMS:  , ,  ,  ,    CIWA:    COWS:     Musculoskeletal: Strength & Muscle Tone: within normal limits Gait & Station: normal Patient leans: N/A  Psychiatric Specialty Exam: Physical Exam  Nursing note and vitals reviewed. Constitutional: He is oriented to person, place, and time. He appears well-developed and well-nourished.  HENT:  Head: Normocephalic and atraumatic.  Respiratory: Effort normal.  Neurological: He is alert and oriented to person, place, and time.    ROS  Blood pressure 110/65, pulse 71, temperature 98.7 F (37.1 C), temperature source Oral, resp. rate 18, height 5\' 7"  (1.702 m), weight 87.1 kg, SpO2 100 %.Body mass index is 30.07 kg/m.  General Appearance: Casual  Eye Contact:  Minimal  Speech:  Normal Rate  Volume:  Normal  Mood:  Anxious, Dysphoric and Irritable  Affect:  Congruent  Thought Process:  Goal Directed and Descriptions of Associations: Circumstantial  Orientation:  Full (Time, Place, and Person)  Thought Content:  Delusions and Hallucinations: Auditory  Suicidal Thoughts:  No  Homicidal  Thoughts:  No  Memory:  Immediate;   Fair Recent;   Fair Remote;   Fair  Judgement:  Impaired  Insight:  Fair  Psychomotor Activity:  Increased  Concentration:  Concentration: Fair and Attention Span: Fair  Recall:  [employer]Fair  Fund of Knowledge:  Fair  Language:  Fair  Akathisia:  Negative  Handed:  Right  AIMS (if indicated):     Assets:  Desire for Improvement Resilience  ADL's:  Intact  Cognition:  WNL  Sleep:  Number of Hours: 7.5     Treatment Plan Summary: Daily contact with patient to assess and evaluate symptoms and progress in treatment, Medication management and Plan : Patient is seen and examined.  Patient is a 28 year old male with the above-stated past psychiatric history who is seen in follow-up.   Diagnosis: #1 schizophrenia  Patient is seen in follow-up.  He appears to be essentially unchanged from yesterday.  He did receive the long-acting paliperidone injection yesterday.  He continues on Seroquel as well.  He still somewhat paranoid and guarded as well as some auditory hallucinations.  No change in his current medications as we await the long-acting paliperidone injection to kick in. 1.  Continue Depakote ER 1500 mg p.o. nightly for mood stability. 2.  Continue long-acting paliperidone injection 234 mg which was given on 8/7 secondary to psychosis. 3.  Continue Seroquel 300 mg p.o. nightly for psychosis, mood stability and sleep. 4.  Continue trazodone 50 mg p.o. nightly for insomnia. 5.  Disposition planning-in progress.  Antonieta PertGreg Lawson Clary, MD 08/14/2018, 10:45 AM

## 2018-08-14 NOTE — Progress Notes (Signed)
D - Patient was in his room upon arrival to the unit. Patient was pleasant during assessment and medication administration. Patient denies SI/HI/AVH, pain and depression. Patient is wanting to get his medication right so he can be discharged.   A - Patient compliant with medication administration per MD orders and procedures on the unit. Patient given education. Patient given support and encouragement to be active in his treatment plan. Patient informed to let staff know if there are any issues or problems on the unit.   R - Patient being monitored Q 15 minutes for safety per unit protocol. Patient remains safe on the unit.  

## 2018-08-14 NOTE — Plan of Care (Signed)
Patient has not been showing any psychotic symptoms this evening and has been appropriate on the unit interacting with peers and staff.   Problem: Education: Goal: Mental status will improve Outcome: Progressing   Problem: Education: Goal: Will be free of psychotic symptoms Outcome: Progressing

## 2018-08-14 NOTE — BHH Group Notes (Signed)
LCSW Group Therapy Note  08/14/2018 1:15pm  Type of Therapy and Topic:  Group Therapy:  Healthy Self Image and Positive Change  Participation Level:  Did Not Attend   Description of Group:  In this group, patients will compare and contrast their current "I am...." statements to the visions they identify as desirable for their lives.  Patients discuss fears and how they can make positive changes in their cognitions that will positively impact their behaviors.  Facilitator played a motivational 3-minute speech and patients were left with the task of thinking about what "I am...." statements they can start using in their lives immediately.  Therapeutic Goals: 1. Patient will state their current self-perception as expressed in an "I Am" statement 2. Patient will contrast this with their desired vision for their live 3. Patient will identify 3 fears that negatively impact their behavior 4. Patient will discuss cognitive distortions that stem from their fears 5. Patient will verbalize statements that challenge their cognitive distortions  Summary of Patient Progress:  Pt was invited to attend group but chose not to attend. CSW will continue to encourage pt to attend group throughout their admission.    Therapeutic Modalities Cognitive Behavioral Therapy Motivational Interviewing  Pierre Dellarocco  CUEBAS-COLON, LCSW 08/14/2018 1:03 PM  

## 2018-08-14 NOTE — Plan of Care (Signed)
Patient oriented to unit. Patient's safety is maintained on unit. Patient denies SI/HI/AVH. Refused to complete daily assessment. Patient is isolative to room for most of the day.    Problem: Education: Goal: Mental status will improve Outcome: Not Progressing   Problem: Safety: Goal: Periods of time without injury will increase Outcome: Not Progressing   Problem: Education: Goal: Will be free of psychotic symptoms Outcome: Not Progressing

## 2018-08-15 NOTE — Progress Notes (Signed)
[facility] MD Progress Note  08/15/2018 10:16 AM Kenneth Jensen  MRN:  [id number] Subjective:  Patient is a 28 year old male with a past psychiatric history significant for schizophrenia admitted on 08/10/2018 secondary to disorganization and paranoia.  Objective: Patient is seen and examined.  Patient is a 28 year old male with the above-stated past psychiatric history who is seen in follow-up.  He stated this morning that he wants to go home with his cousin.  His cousin stated that he could come there.  We discussed the fact that social work would have to discuss with the cousin and make sure that it is okay with them for him to go to live there.  He seems less disorganized and less paranoid this morning.  He continues on Depakote ER, the long-acting paliperidone injection which was given on 8/7 as well as Seroquel.  His vital signs are stable, he is afebrile.  His pulse oximetry is 100% on room air.  He slept 7.25 hours last night.  No new laboratories this morning.  He denied any auditory or visual hallucinations.  He denied any suicidal or homicidal ideation.  Principal Problem: Schizophrenia ([place]) Diagnosis: Principal Problem:   Schizophrenia ([place])  Total Time spent with patient: 15 minutes  Past Psychiatric History: See admission H&P  Past Medical History:  Past Medical History:  Diagnosis Date  . Asthma   . Bipolar 1 disorder ([place])   . Depression   . Schizotaxia    History reviewed. No pertinent surgical history. Family History: History reviewed. No pertinent family history. Family Psychiatric  History: See admission H&P Social History:  Social History   Substance and Sexual Activity  Alcohol Use Yes   Comment: socially     Social History   Substance and Sexual Activity  Drug Use Yes  . Types: Marijuana    Social History   Socioeconomic History  . Marital status: Single    Spouse name: Not on file  . Number of children: Not on file  . Years of education: Not on file  .  Highest education level: Not on file  Occupational History  . Not on file  Social Needs  . Financial resource strain: Not on file  . Food insecurity    Worry: Not on file    Inability: Not on file  . Transportation needs    Medical: Not on file    Non-medical: Not on file  Tobacco Use  . Smoking status: Never Smoker  . Smokeless tobacco: Never Used  Substance and Sexual Activity  . Alcohol use: Yes    Comment: socially  . Drug use: Yes    Types: Marijuana  . Sexual activity: Not on file  Lifestyle  . Physical activity    Days per week: Not on file    Minutes per session: Not on file  . Stress: Not on file  Relationships  . Social Herbalist on phone: Not on file    Gets together: Not on file    Attends religious service: Not on file    Active member of club or organization: Not on file    Attends meetings of clubs or organizations: Not on file    Relationship status: Not on file  Other Topics Concern  . Not on file  Social History Narrative  . Not on file   Additional Social History:  Sleep: Good  Appetite:  Good  Current Medications: Current Facility-Administered Medications  Medication Dose Route Frequency Provider Last Rate Last Dose  . alum & mag hydroxide-simeth (MAALOX/MYLANTA) 200-200-20 MG/5ML suspension 30 mL  30 mL Oral Q4H PRN Charm RingsLord, Jamison Y, NP      . divalproex (DEPAKOTE) DR tablet 1,500 mg  1,500 mg Oral QHS Clapacs, Jackquline DenmarkJohn T, MD   1,500 mg at 08/14/18 2125  . magnesium hydroxide (MILK OF MAGNESIA) suspension 30 mL  30 mL Oral Daily PRN Charm RingsLord, Jamison Y, NP      . nicotine (NICODERM CQ - dosed in mg/24 hours) patch 14 mg  14 mg Transdermal Daily Clapacs, Jackquline DenmarkJohn T, MD   14 mg at 08/14/18 1204  . QUEtiapine (SEROQUEL) tablet 300 mg  300 mg Oral QHS Clapacs, John T, MD   300 mg at 08/14/18 2125  . traZODone (DESYREL) tablet 50 mg  50 mg Oral QHS Charm RingsLord, Jamison Y, NP   50 mg at 08/14/18 2125    Lab Results: No  results found for this or any previous visit (from the past 48 hour(s)).  Blood Alcohol level:  Lab Results  Component Value Date   ETH <10 08/09/2018   ETH <10 08/02/2018    Metabolic Disorder Labs: No results found for: HGBA1C, MPG No results found for: PROLACTIN Lab Results  Component Value Date   CHOL 134 08/09/2018   TRIG 99 08/09/2018   HDL 39 (L) 08/09/2018   CHOLHDL 3.4 08/09/2018   VLDL 20 08/09/2018   LDLCALC 75 08/09/2018    Physical Findings: AIMS:  , ,  ,  ,    CIWA:    COWS:     Musculoskeletal: Strength & Muscle Tone: within normal limits Gait & Station: normal Patient leans: N/A  Psychiatric Specialty Exam: Physical Exam  Nursing note and vitals reviewed. Constitutional: He is oriented to person, place, and time. He appears well-developed and well-nourished.  HENT:  Head: Normocephalic and atraumatic.  Respiratory: Effort normal.  Neurological: He is alert and oriented to person, place, and time.    ROS  Blood pressure 103/67, pulse 70, temperature 98.7 F (37.1 C), temperature source Oral, resp. rate 16, height 5\' 7"  (1.702 m), weight 87.1 kg, SpO2 100 %.Body mass index is 30.07 kg/m.  General Appearance: Fairly Groomed  Eye Contact:  Fair  Speech:  Normal Rate  Volume:  Normal  Mood:  Anxious  Affect:  Congruent  Thought Process:  Coherent and Descriptions of Associations: Circumstantial  Orientation:  Full (Time, Place, and Person)  Thought Content:  Logical  Suicidal Thoughts:  No  Homicidal Thoughts:  No  Memory:  Immediate;   Fair Recent;   Fair Remote;   Fair  Judgement:  Intact  Insight:  Fair  Psychomotor Activity:  Normal  Concentration:  Concentration: Fair and Attention Span: Fair  Recall:  [employer]ervFair  Fund of Knowledge:  Fair  Language:  Fair  Akathisia:  Negative  Handed:  Right  AIMS (if indicated):     Assets:  Desire for Improvement Resilience  ADL's:  Intact  Cognition:  WNL  Sleep:  Number of Hours: 7.25      Treatment Plan Summary: Daily contact with patient to assess and evaluate symptoms and progress in treatment, Medication management and Plan : Patient is seen and examined.  Patient is a 28 year old male with the above-stated past psychiatric history who is seen in follow-up.   Diagnosis: #1 schizophrenia.  Patient is seen in follow-up.  At least review of the record shows that he is essentially unchanged.  His paranoia and guardedness seems to be somewhat decreased.  He denied any auditory hallucinations today.  No change in his current medications.  I will order a Depakote level, liver function enzymes and a CBC with differential in the a.m. tomorrow.  1.  Continue Depakote ER 1500 mg p.o. nightly for mood stability. 2.  Continue long-acting paliperidone injection 234 mg which was given on 8/7 secondary to psychosis. 3.  Continue Seroquel 300 mg p.o. nightly for psychosis, mood stability and sleep. 4.  Continue trazodone 50 mg p.o. nightly for insomnia. 5.    Depakote level, liver function enzymes and CBC with differential in a.m. secondary to Depakote treatment. 6.  Disposition planning-in progress. Antonieta PertGreg Lawson Patrick Salemi, MD 08/15/2018, 10:16 AM

## 2018-08-15 NOTE — B[facility] Group Notes (Signed)
LCSW Group Therapy Note 08/15/2018 1:15pm  Type of Therapy and Topic: Group Therapy: Feelings Around Returning Home & Establishing a Supportive Framework and Supporting Oneself When Supports Not Available  Participation Level: Did Not Attend  Description of Group:  Patients first processed thoughts and feelings about upcoming discharge. These included fears of upcoming changes, lack of change, new living environments, judgements and expectations from others and overall stigma of mental health issues. The group then discussed the definition of a supportive framework, what that looks and feels like, and how do to discern it from an unhealthy non-supportive network. The group identified different types of supports as well as what to do when your family/friends are less than helpful or unavailable  Therapeutic Goals  1. Patient will identify one healthy supportive network that they can use at discharge. 2. Patient will identify one factor of a supportive framework and how to tell it from an unhealthy network. 3. Patient able to identify one coping skill to use when they do not have positive supports from others. 4. Patient will demonstrate ability to communicate their needs through discussion and/or role plays.  Summary of Patient Progress:  Pt was invited to attend group but chose not to attend. CSW will continue to encourage pt to attend group throughout their admission.   Therapeutic Modalities Cognitive Behavioral Therapy Motivational Interviewing   Kenneth Babula  CUEBAS-COLON, LCSW 08/15/2018 2:59 PM  

## 2018-08-15 NOTE — Plan of Care (Signed)
D- Patient alert and oriented. Patient presents in a pleasant mood on assessment stating that he slept good last night and had no major complaints to voice to this Probation officer. Patient denies any signs/symptoms of depression/anxiety, reporting that overall he is feeling "good" and that if he had to rate how good he was feeling, he would rate his mood a "10/10". Patient also denies SI, HI, AVH, and pain at this time. Patient's goal for today is to "talk to the doctor about getting released".  A- Support and encouragement provided. Routine safety checks conducted every 15 minutes.  Patient informed to notify staff with problems or concerns.  R- Patient contracts for safety at this time. Patient compliant with treatment plan. Patient receptive, calm, and cooperative. Patient interacts well with others on the unit.  Patient remains safe at this time.  Problem: Education: Goal: Mental status will improve Outcome: Progressing   Problem: Safety: Goal: Periods of time without injury will increase Outcome: Progressing   Problem: Education: Goal: Will be free of psychotic symptoms Outcome: Progressing Goal: Knowledge of the prescribed therapeutic regimen will improve Outcome: Progressing   Problem: Health Behavior/Discharge Planning: Goal: Compliance with prescribed medication regimen will improve Outcome: Progressing

## 2018-08-16 LAB — HEPATIC FUNCTION PANEL
ALT: 14 U/L (ref 0–44)
AST: 26 U/L (ref 15–41)
Albumin: 3.8 g/dL (ref 3.5–5.0)
Alkaline Phosphatase: 86 U/L (ref 38–126)
Bilirubin, Direct: <0.1 mg/dL (ref 0.0–0.2)
Total Bilirubin: 0.4 mg/dL (ref 0.3–1.2)
Total Protein: 7 g/dL (ref 6.5–8.1)

## 2018-08-16 LAB — CBC WITH DIFFERENTIAL/PLATELET
Abs Immature Granulocytes: 0.09 10*3/uL — ABNORMAL HIGH (ref 0.00–0.07)
Basophils Absolute: 0 10*3/uL (ref 0.0–0.1)
Basophils Relative: 0 %
Eosinophils Absolute: 0.3 10*3/uL (ref 0.0–0.5)
Eosinophils Relative: 4 %
HCT: 42.2 % (ref 39.0–52.0)
Hemoglobin: 14.1 g/dL (ref 13.0–17.0)
Immature Granulocytes: 1 %
Lymphocytes Relative: 39 %
Lymphs Abs: 3.2 10*3/uL (ref 0.7–4.0)
MCH: 29.6 pg (ref 26.0–34.0)
MCHC: 33.4 g/dL (ref 30.0–36.0)
MCV: 88.5 fL (ref 80.0–100.0)
Monocytes Absolute: 0.8 10*3/uL (ref 0.1–1.0)
Monocytes Relative: 10 %
Neutro Abs: 3.9 10*3/uL (ref 1.7–7.7)
Neutrophils Relative %: 46 %
Platelets: 277 10*3/uL (ref 150–400)
RBC: 4.77 MIL/uL (ref 4.22–5.81)
RDW: 12.8 % (ref 11.5–15.5)
WBC: 8.3 10*3/uL (ref 4.0–10.5)
nRBC: 0 % (ref 0.0–0.2)

## 2018-08-16 LAB — VALPROIC ACID LEVEL: Valproic Acid Lvl: 99 ug/mL (ref 50.0–100.0)

## 2018-08-16 MED ORDER — DIVALPROEX SODIUM 500 MG PO DR TAB: 1500.0000 mg | DELAYED_RELEASE_TABLET | Freq: Every day | ORAL | 0 refills | Status: DC

## 2018-08-16 MED ORDER — QUETIAPINE FUMARATE 300 MG PO TABS: 300.0000 mg | ORAL_TABLET | Freq: Every day | ORAL | 0 refills | Status: DC

## 2018-08-16 MED ORDER — TRAZODONE HCL 50 MG PO TABS: 50.0000 mg | ORAL_TABLET | Freq: Every day | ORAL | 0 refills | Status: DC

## 2018-08-16 NOTE — Tx Team (Signed)
Interdisciplinary Treatment and Diagnostic Plan Update  08/16/2018 Time of Session: 8:30 AM Kenneth Jensen MRN: [id number]  Principal Diagnosis: Schizophrenia [facility])  Secondary Diagnoses: Principal Problem:   Schizophrenia ([place])   Current Medications:  Current Facility-Administered Medications  Medication Dose Route Frequency Provider Last Rate Last Dose  . alum & mag hydroxide-simeth (MAALOX/MYLANTA) 200-200-20 MG/5ML suspension 30 mL  30 mL Oral Q4H PRN Patrecia Pour, NP      . divalproex (DEPAKOTE) DR tablet 1,500 mg  1,500 mg Oral QHS Clapacs, Madie Reno, MD   1,500 mg at 08/15/18 2124  . magnesium hydroxide (MILK OF MAGNESIA) suspension 30 mL  30 mL Oral Daily PRN Patrecia Pour, NP      . nicotine (NICODERM CQ - dosed in mg/24 hours) patch 14 mg  14 mg Transdermal Daily Clapacs, Madie Reno, MD   14 mg at 08/16/18 0814  . QUEtiapine (SEROQUEL) tablet 300 mg  300 mg Oral QHS Clapacs, John T, MD   300 mg at 08/15/18 2124  . traZODone (DESYREL) tablet 50 mg  50 mg Oral QHS Patrecia Pour, NP   50 mg at 08/15/18 2124   PTA Medications: Medications Prior to Admission  Medication Sig Dispense Refill Last Dose  . CAPLYTA 42 MG CAPS Take 42 mg by mouth at bedtime.     Lorayne Bender SUSTENNA 234 MG/1.5ML SUSY injection INJECT 1.5MLS (234MG ) INTRAMUSCULARLY EVERY 2 WEEKS       Patient Stressors: Marital or family conflict Medication change or noncompliance Other: psychosis  Patient Strengths: Motivation for treatment/growth Physical Health  Treatment Modalities: Medication Management, Group therapy, Case management,  1 to 1 session with clinician, Psychoeducation, Recreational therapy.   Physician Treatment Plan for Primary Diagnosis: Schizophrenia ([place]) Long Term Goal(s): Improvement in symptoms so as ready for discharge Improvement in symptoms so as ready for discharge   Short Term Goals: Ability to verbalize feelings will improve Compliance with prescribed medications will  improve  Medication Management: Evaluate patient's response, side effects, and tolerance of medication regimen.  Therapeutic Interventions: 1 to 1 sessions, Unit Group sessions and Medication administration.  Evaluation of Outcomes: Progressing  Physician Treatment Plan for Secondary Diagnosis: Principal Problem:   Schizophrenia ([place])  Long Term Goal(s): Improvement in symptoms so as ready for discharge Improvement in symptoms so as ready for discharge   Short Term Goals: Ability to verbalize feelings will improve Compliance with prescribed medications will improve     Medication Management: Evaluate patient's response, side effects, and tolerance of medication regimen.  Therapeutic Interventions: 1 to 1 sessions, Unit Group sessions and Medication administration.  Evaluation of Outcomes: Progressing   RN Treatment Plan for Primary Diagnosis: Schizophrenia ([place]) Long Term Goal(s): Knowledge of disease and therapeutic regimen to maintain health will improve  Short Term Goals: Ability to participate in decision making will improve, Ability to verbalize feelings will improve, Ability to identify and develop effective coping behaviors will improve and Compliance with prescribed medications will improve  Medication Management: RN will administer medications as ordered by provider, will assess and evaluate patient's response and provide education to patient for prescribed medication. RN will report any adverse and/or side effects to prescribing provider.  Therapeutic Interventions: 1 on 1 counseling sessions, Psychoeducation, Medication administration, Evaluate responses to treatment, Monitor vital signs and CBGs as ordered, Perform/monitor CIWA, COWS, AIMS and Fall Risk screenings as ordered, Perform wound care treatments as ordered.  Evaluation of Outcomes: Progressing   LCSW Treatment Plan for Primary Diagnosis: Schizophrenia ([place]) Long  Term Goal(s): Safe transition to appropriate  next level of care at discharge, Engage patient in therapeutic group addressing interpersonal concerns.  Short Term Goals: Engage patient in aftercare planning with referrals and resources, Increase social support and Increase skills for wellness and recovery  Therapeutic Interventions: Assess for all discharge needs, 1 to 1 time with Social worker, Explore available resources and support systems, Assess for adequacy in community support network, Educate family and significant other(s) on suicide prevention, Complete Psychosocial Assessment, Interpersonal group therapy.  Evaluation of Outcomes: Progressing   Progress in Treatment: Attending groups: Yes. Participating in groups: Yes. Taking medication as prescribed: Yes. Toleration medication: Yes. Family/Significant other contact made: Yes, individual(s) contacted:  pts uncle Patient understands diagnosis: No. Discussing patient identified problems/goals with staff: Yes. Medical problems stabilized or resolved: Yes. Denies suicidal/homicidal ideation: Yes. Issues/concerns per patient self-inventory: No. Other: N/A  New problem(s) identified: No, Describe:  none  New Short Term/Long Term Goal(s): medication management for mood stabilization;  development of comprehensive mental wellness/sobriety plan.   Patient Goals:  "to get better and go [place] a group home in [place]"  Discharge Plan or Barriers: SPE pamphlet, Mobile Crisis information, and AA/NA information provided to patient for additional community support and resources. Pt's [place] ACT [place] out [place] and has started the process of referring pt to an ACT team in [place]. At this time pt will be discharging back to his cousin's house. Update 08/16/2018:  Patient provided consent for the CSW to speak with his cousin who confirms that the patient can be discharged to her home.  [facility] has referred the  patient to local ACT teams. [facility] will remain in place until new ACT team is able to begin.   Reason for Continuation of Hospitalization: Medication stabilization  Estimated Length of Stay: 5-7 days  Attendees: Patient:  08/16/2018 1:11 PM  Physician: Dr Toni Amendlapacs MD 08/16/2018 1:11 PM  Nursing:  08/16/2018 1:11 PM  RN Care Manager: 08/16/2018 1:11 PM  Social Worker: Penni HomansMichaela Kouper Spinella LCSW 08/16/2018 1:11 PM  Recreational Therapist:  08/16/2018 1:11 PM  Other: 08/16/2018 1:11 PM  Other:  08/16/2018 1:11 PM  Other: 08/16/2018 1:11 PM    Scribe for Treatment Team: Harden MoMichaela J Olena Willy, LCSW 08/16/2018 1:11 PM

## 2018-08-16 NOTE — Plan of Care (Signed)
D- Patient alert and oriented. Patient presents in a pleasant mood on assessment stating that he slept good last night and had no complaints to voice to this Probation officer. Patient  Denies any signs/symptoms of depression/anxiety to this Probation officer. Patient also denies SI, HI, AVH, and pain at this time. Patient's goal for today is "to get released".  A- Scheduled medications administered to patient, per MD orders. Support and encouragement provided.  Routine safety checks conducted every 15 minutes.  Patient informed to notify staff with problems or concerns.  R- No adverse drug reactions noted. Patient contracts for safety at this time. Patient compliant with medications and treatment plan. Patient receptive, calm, and cooperative. Patient interacts well with others on the unit.  Patient remains safe at this time.  Problem: Education: Goal: Mental status will improve Outcome: Progressing   Problem: Safety: Goal: Periods of time without injury will increase Outcome: Progressing   Problem: Education: Goal: Will be free of psychotic symptoms Outcome: Progressing Goal: Knowledge of the prescribed therapeutic regimen will improve Outcome: Progressing   Problem: Health Behavior/Discharge Planning: Goal: Compliance with prescribed medication regimen will improve Outcome: Progressing

## 2018-08-16 NOTE — BHH Group Notes (Signed)
Overcoming Obstacles  08/16/2018 1PM  Type of Therapy and Topic:  Group Therapy:  Overcoming Obstacles  Participation Level:  Active    Description of Group:    In this group patients will be encouraged to explore what they see as obstacles to their own wellness and recovery. They will be guided to discuss their thoughts, feelings, and behaviors related to these obstacles. The group will process together ways to cope with barriers, with attention given to specific choices patients can make. Each patient will be challenged to identify changes they are motivated to make in order to overcome their obstacles. This group will be process-oriented, with patients participating in exploration of their own experiences as well as giving and receiving support and challenge from other group members.   Therapeutic Goals: 1. Patient will identify personal and current obstacles as they relate to admission. 2. Patient will identify barriers that currently interfere with their wellness or overcoming obstacles.  3. Patient will identify feelings, thought process and behaviors related to these barriers. 4. Patient will identify two changes they are willing to make to overcome these obstacles:      Summary of Patient Progress Pt came to group late but actively and appropriately participated. Pt identified unhealthy relationships as a barrier he is working on overcoming. Pt discussed with members how he is working on prioritizing his needs to find a healthy balance in life.    Therapeutic Modalities:   Cognitive Behavioral Therapy Solution Focused Therapy Motivational Interviewing Relapse Prevention Therapy    Sanjuana Kava, MSW, [place] 08/16/2018 2:04 PM

## 2018-08-16 NOTE — Progress Notes (Signed)
D - Patient was in his room upon arrival to the unit. Patient was pleasant during assessment and medication administration. Patient denies SI/HI/AVH, pain and depression. Patient is excited to be discharging tomorrow.   A - Patient compliant with medication administration per MD orders and procedures on the unit. Patient given education. Patient given support and encouragement to be active in his treatment plan. Patient informed to let staff know if there are any issues or problems on the unit.   R - Patient being monitored Q 15 minutes for safety per unit protocol. Patient remains safe on the unit.

## 2018-08-16 NOTE — BHH Suicide Risk Assessment (Signed)
[facility] Discharge Suicide Risk Assessment   Principal Problem: Schizophrenia [facility]) Discharge Diagnoses: Principal Problem:   Schizophrenia ([place])   Total Time spent with patient: 45 minutes  Musculoskeletal: Strength & Muscle Tone: within normal limits Gait & Station: normal Patient leans: N/A  Psychiatric Specialty Exam: Review of Systems  Constitutional: Negative.   HENT: Negative.   Eyes: Negative.   Respiratory: Negative.   Cardiovascular: Negative.   Gastrointestinal: Negative.   Musculoskeletal: Negative.   Skin: Negative.   Neurological: Negative.   Psychiatric/Behavioral: Negative.     Blood pressure 125/74, pulse 76, temperature (!) 97.3 F (36.3 C), temperature source Oral, resp. rate 18, height 5\' 7"  (1.702 m), weight 87.1 kg, SpO2 100 %.Body mass index is 30.07 kg/m.  General Appearance: Casual  Eye Contact::  Good  Speech:  Clear and [id number]  Volume:  Normal  Mood:  Euthymic  Affect:  Constricted  Thought Process:  Goal Directed  Orientation:  Full (Time, Place, and Person)  Thought Content:  Logical  Suicidal Thoughts:  No  Homicidal Thoughts:  No  Memory:  Immediate;   Fair Recent;   Fair Remote;   Fair  Judgement:  Fair  Insight:  Fair  Psychomotor Activity:  Normal  Concentration:  Fair  Recall:  [employer] of Knowledge:Fair  Language: Fair  Akathisia:  No  Handed:  Right  AIMS (if indicated):     Assets:  Desire for Improvement  Sleep:  Number of Hours: 7  Cognition: WNL  ADL's:  Intact   Mental Status Per Nursing Assessment::   On Admission:  NA(Denies)  Demographic Factors:  Male and Adolescent or young adult  Loss Factors: Financial problems/change in socioeconomic status  Historical Factors: Impulsivity  Risk Reduction Factors:   Sense of responsibility to family, Living with another person, especially a relative, Positive social support and Positive therapeutic relationship  Continued Clinical Symptoms:  Schizophrenia:    Paranoid or undifferentiated type  Cognitive Features That Contribute To Risk:  None    Suicide Risk:  Minimal: No identifiable suicidal ideation.  Patients presenting with no risk factors but with morbid ruminations; may be classified as minimal risk based on the severity of the depressive symptoms    Plan Of Care/Follow-up recommendations:  Activity:  Activity as tolerated Diet:  Regular diet Other:  Follow-up with [facility] and then establish contact with act team.  Continue current medicine.  Alethia Berthold, MD 08/16/2018, 4:30 PM

## 2018-08-16 NOTE — Progress Notes (Signed)
Recreation Therapy Notes  Date: 08/16/2018  Time: 9:30 am  Location: Craft Room  Behavioral response: Appropriate  Intervention Topic: Self-esteem  Discussion/Intervention:  Group content today was focused on self-esteem. Patient defined self-esteem and where it comes form. The group described reasons self-esteem is important. Individuals stated things that impact self-esteem and positive ways to improve self-esteem. The group participated in the intervention "Improving Me" where patients were able to identify and explore ways to improve self-esteem. Clinical Observations/Feedback:  Patient came to group and defined self-esteem as confidence. He explained that he could improve his self-esteem by exercising. Individual was social with peers and staff while participating in group   Rosenhayn LRT/CTRS         [place] 08/16/2018 11:26 AM

## 2018-08-16 NOTE — Progress Notes (Signed)
Patient has now stated that he wants his nicotine patch.

## 2018-08-16 NOTE — Plan of Care (Signed)
Patient presents without psychotic symptoms and is compliant with medication administration per MD orders and procedures on the unit.   Problem: Education: Goal: Will be free of psychotic symptoms Outcome: Progressing

## 2018-08-16 NOTE — Progress Notes (Signed)
[facility]  08/16/2018 4:25 PM Kenneth JoyChristopher Jensen  MRN:  [id number] Subjective: Patient seen chart reviewed.  Patient has no new complaints.  Reports that he feels like his mood is pretty good.  He seems to have been behaving well over the weekend.  He denies having any hallucinations.  He took news that I was not going to discharge him today quite well did not get aggressive or agitated.  Labs done today are all completely normal with a Depakote level at 99.  Patient appears to be stable enough that we can start making plans for discharge tomorrow. Principal Problem: Schizophrenia (HCC) Diagnosis: Principal Problem:   Schizophrenia (HCC)  Total Time spent with patient: 30 minutes  Past Psychiatric History: Patient has a history of schizophrenia and has had prior hospitalizations on long standing follow-up by an act team  Past Medical History:  Past Medical History:  Diagnosis Date  . Asthma   . Bipolar 1 disorder (HCC)   . Depression   . Schizotaxia    History reviewed. No pertinent surgical history. Family History: History reviewed. No pertinent family history. Family Psychiatric  History: See previous Social History:  Social History   Substance and Sexual Activity  Alcohol Use Yes   Comment: socially     Social History   Substance and Sexual Activity  Drug Use Yes  . Types: Marijuana    Social History   Socioeconomic History  . Marital status: Single    Spouse name: Not on file  . Number of children: Not on file  . Years of education: Not on file  . Highest education level: Not on file  Occupational History  . Not on file  Social Needs  . Financial resource strain: Not on file  . Food insecurity    Worry: Not on file    Inability: Not on file  . Transportation needs    Medical: Not on file    Non-medical: Not on file  Tobacco Use  . Smoking status: Never Smoker  . Smokeless tobacco: Never Used  Substance and Sexual Activity  . Alcohol use: Yes     Comment: socially  . Drug use: Yes    Types: Marijuana  . Sexual activity: Not on file  Lifestyle  . Physical activity    Days per week: Not on file    Minutes per session: Not on file  . Stress: Not on file  Relationships  . Social Musicianconnections    Talks on phone: Not on file    Gets together: Not on file    Attends religious service: Not on file    Active member of club or organization: Not on file    Attends meetings of clubs or organizations: Not on file    Relationship status: Not on file  Other Topics Concern  . Not on file  Social History Narrative  . Not on file   Additional Social History:                         Sleep: Good  Appetite:  Fair  Current Medications: Current Facility-Administered Medications  Medication Dose Route Frequency Provider Last Rate Last Dose  . alum & mag hydroxide-simeth (MAALOX/MYLANTA) 200-200-20 MG/5ML suspension 30 mL  30 mL Oral Q4H PRN Charm RingsLord, Jamison Y, NP      . divalproex (DEPAKOTE) DR tablet 1,500 mg  1,500 mg Oral QHS Jewelz Ricklefs, Jackquline DenmarkJohn T, MD   1,500 mg at 08/15/18 2124  .  magnesium hydroxide (MILK OF MAGNESIA) suspension 30 mL  30 mL Oral Daily PRN Patrecia Pour, NP      . nicotine (NICODERM CQ - dosed in mg/24 hours) patch 14 mg  14 mg Transdermal Daily Mishel Sans, Madie Reno, MD   14 mg at 08/16/18 0814  . QUEtiapine (SEROQUEL) tablet 300 mg  300 mg Oral QHS Kasson Lamere T, MD   300 mg at 08/15/18 2124  . traZODone (DESYREL) tablet 50 mg  50 mg Oral QHS Patrecia Pour, NP   50 mg at 08/15/18 2124    Lab Results:  Results for orders placed or performed during the hospital encounter of 08/09/18 (from the past 48 hour(s))  CBC with Differential/Platelet     Status: Abnormal   Collection Time: 08/16/18  8:55 AM  Result Value Ref Range   WBC 8.3 4.0 - 10.5 K/uL   RBC 4.77 4.22 - 5.81 MIL/uL   Hemoglobin 14.1 13.0 - 17.0 g/dL   HCT 42.2 39.0 - 52.0 %   MCV 88.5 80.0 - 100.0 fL   MCH 29.6 26.0 - 34.0 pg   MCHC 33.4 30.0 - 36.0  g/dL   RDW 12.8 11.5 - 15.5 %   Platelets 277 150 - 400 K/uL   nRBC 0.0 0.0 - 0.2 %   Neutrophils Relative % 46 %   Neutro Abs 3.9 1.7 - 7.7 K/uL   Lymphocytes Relative 39 %   Lymphs Abs 3.2 0.7 - 4.0 K/uL   Monocytes Relative 10 %   Monocytes Absolute 0.8 0.1 - 1.0 K/uL   Eosinophils Relative 4 %   Eosinophils Absolute 0.3 0.0 - 0.5 K/uL   Basophils Relative 0 %   Basophils Absolute 0.0 0.0 - 0.1 K/uL   Immature Granulocytes 1 %   Abs Immature Granulocytes 0.09 (H) 0.00 - 0.07 K/uL    Comment: Performed at [facility], [address] [place], [place] [address]  Hepatic function panel     Status: None   Collection Time: 08/16/18  8:55 AM  Result Value Ref Range   Total Protein 7.0 6.5 - 8.1 g/dL   Albumin 3.8 3.5 - 5.0 g/dL   AST 26 15 - 41 U/L   ALT 14 0 - 44 U/L   Alkaline Phosphatase 86 38 - 126 U/L   Total Bilirubin 0.4 0.3 - 1.2 mg/dL   Bilirubin, Direct <0.1 0.0 - 0.2 mg/dL   Indirect Bilirubin NOT CALCULATED 0.3 - 0.9 mg/dL    Comment: Performed at [facility], [address] [place], [place] [address]  Valproic acid level     Status: None   Collection Time: 08/16/18  8:55 AM  Result Value Ref Range   Valproic Acid Lvl 99 50.0 - 100.0 ug/mL    Comment: Performed at [facility], [address] [place], [place] [address]    Blood Alcohol level:  Lab Results  Component Value Date   [facility] <10 08/09/2018   ETH <10 31/54/0086    Metabolic Disorder Labs: No results found for: HGBA1C, MPG No results found for: PROLACTIN Lab Results  Component Value Date   CHOL 134 08/09/2018   TRIG 99 08/09/2018   HDL 39 (L) 08/09/2018   CHOLHDL 3.4 08/09/2018   VLDL 20 08/09/2018   LDLCALC 75 08/09/2018    Physical Findings: AIMS:  , ,  ,  ,    CIWA:    COWS:     Musculoskeletal: Strength & Muscle Tone: within normal  limits Gait & Station: normal Patient leans: N/A  Psychiatric Specialty Exam: Physical Exam  Nursing note and vitals  reviewed. Constitutional: He appears well-developed and well-nourished.  HENT:  Head: Normocephalic and atraumatic.  Eyes: Pupils are equal, round, and reactive to light. Conjunctivae are normal.  Neck: Normal range of motion.  Cardiovascular: Regular rhythm and normal heart sounds.  Respiratory: Effort normal. No respiratory distress.  GI: Soft.  Musculoskeletal: Normal range of motion.  Neurological: He is alert.  Skin: Skin is warm and dry.  Psychiatric: He has a normal mood and affect. Judgment normal. His speech is delayed. He is slowed. Thought content is not paranoid. Cognition and memory are normal. He expresses no homicidal and no suicidal ideation.    Review of Systems  Constitutional: Negative.   HENT: Negative.   Eyes: Negative.   Respiratory: Negative.   Cardiovascular: Negative.   Gastrointestinal: Negative.   Musculoskeletal: Negative.   Skin: Negative.   Neurological: Negative.   Psychiatric/Behavioral: Negative for depression, hallucinations, memory loss, substance abuse and suicidal ideas. The patient is not nervous/anxious and does not have insomnia.     Blood pressure 125/74, pulse 76, temperature (!) 97.3 F (36.3 C), temperature source Oral, resp. rate 18, height 5\' 7"  (1.702 m), weight 87.1 kg, SpO2 100 %.Body mass index is 30.07 kg/m.  General Appearance: Casual  Eye Contact:  Good  Speech:  Clear and Coherent  Volume:  Normal  Mood:  Euthymic  Affect:  Congruent  Thought Process:  Goal Directed  Orientation:  Full (Time, Place, and Person)  Thought Content:  Logical  Suicidal Thoughts:  No  Homicidal Thoughts:  No  Memory:  Immediate;   Fair Recent;   Fair Remote;   Fair  Judgement:  Fair  Insight:  Fair  Psychomotor Activity:  Decreased  Concentration:  Concentration: Fair  Recall:  [employer]  Fund of Knowledge:  Fair  Language:  Fair  Akathisia:  No  Handed:  Right  AIMS (if indicated):     Assets:  Desire for Improvement  ADL's:  Intact   Cognition:  WNL  Sleep:  Number of Hours: 7     Treatment Plan Summary: Daily contact with patient to assess and evaluate symptoms and progress in treatment, Medication management and Plan Patient appears to be doing well and to be quite stable.  Getting Depakote and Seroquel on top of his long-acting injections.  Currently denies hallucinations.  Organized thinking no overt paranoia.  Patient will be discharged tomorrow.  We have made arrangements to hopefully have him get in contact with local act team's but he [facility].  Mordecai RasmussenJohn Lakshmi Sundeen, MD 08/16/2018, 4:25 PM

## 2018-08-16 NOTE — Progress Notes (Signed)
Patient refused his morning nicotine patch.

## 2018-08-17 MED ORDER — INVEGA SUSTENNA 234 MG/1.5ML IM SUSY: PREFILLED_SYRINGE | INTRAMUSCULAR | 4 refills | Status: DC

## 2018-08-17 MED ORDER — QUETIAPINE FUMARATE 300 MG PO TABS: 300.0000 mg | ORAL_TABLET | Freq: Every day | ORAL | 2 refills | Status: DC

## 2018-08-17 MED ORDER — TRAZODONE HCL 50 MG PO TABS: 50.0000 mg | ORAL_TABLET | Freq: Every day | ORAL | 2 refills | Status: DC

## 2018-08-17 MED ORDER — DIVALPROEX SODIUM 500 MG PO DR TAB: 1500.0000 mg | DELAYED_RELEASE_TABLET | Freq: Every day | ORAL | 2 refills | Status: DC

## 2018-08-17 NOTE — Progress Notes (Signed)
D: Patient is aware of  Discharge this shift   A:.Patient denies suicidal /homicidal ideations. Patient received all belongings brought in   No Storage medications. Writer reviewed Discharge Summary, Suicide Risk Assessment, and Transitional Record. Patient also received Prescriptions   from  MD. A 10 day supply of medications given to patient  . Aware  Of follow up appointment .  R: Patient left unit with no questions  Or concerns  With taxi

## 2018-08-17 NOTE — Plan of Care (Signed)
  Problem: Education: Goal: Mental status will improve Outcome: Adequate for Discharge   Problem: Education: Goal: Will be free of psychotic symptoms Outcome: Adequate for Discharge Goal: Knowledge of the prescribed therapeutic regimen will improve Outcome: Adequate for Discharge   Problem: Education: Goal: Knowledge of the prescribed therapeutic regimen will improve Outcome: Adequate for Discharge   Problem: Health Behavior/Discharge Planning: Goal: Compliance with prescribed medication regimen will improve Outcome: Adequate for Discharge   Problem: Education: Goal: Mental status will improve Outcome: Adequate for Discharge

## 2018-08-17 NOTE — Plan of Care (Signed)
  Problem: Group Participation Goal: STG - Patient will engage in groups without prompting or encouragement from LRT x3 group sessions within 5 recreation therapy group sessions Description: STG - Patient will engage in groups without prompting or encouragement from LRT x3 group sessions within 5 recreation therapy group sessions 08/17/2018 1450 by Ernest Haber, LRT Outcome: Adequate for Discharge 08/17/2018 1450 by Ernest Haber, LRT Outcome: Adequate for Discharge

## 2018-08-17 NOTE — Discharge Summary (Signed)
Physician Discharge Summary Note  Patient:  Kenneth Jensen is an 28 y.o., male MRN:  [id number] DOB:  1990-08-17 Patient phone:  [phone] (home)  Patient address:   [place] [address],  Total Time spent with patient: 45 minutes  Date of Admission:  08/09/2018 Date of Discharge: August 16, 2018  Reason for Admission: Admitted because of return of psychotic agitated behavior at home  Principal Problem: Schizophrenia [facility]) Discharge Diagnoses: Principal Problem:   Schizophrenia [facility])   Past Psychiatric History: Past history of schizophrenia complicated by possible head injury.  Previously followed by act team in [place].  Past Medical History:  Past Medical History:  Diagnosis Date  . Asthma   . Bipolar 1 disorder ([place])   . Depression   . Schizotaxia    History reviewed. No pertinent surgical history. Family History: History reviewed. No pertinent family history. Family Psychiatric  History: See previous Social History:  Social History   Substance and Sexual Activity  Alcohol Use Yes   Comment: socially     Social History   Substance and Sexual Activity  Drug Use Yes  . Types: Marijuana    Social History   Socioeconomic History  . Marital status: Single    Spouse name: Not on file  . Number of children: Not on file  . Years of education: Not on file  . Highest education level: Not on file  Occupational History  . Not on file  Social Needs  . Financial resource strain: Not on file  . Food insecurity    Worry: Not on file    Inability: Not on file  . Transportation needs    Medical: Not on file    Non-medical: Not on file  Tobacco Use  . Smoking status: Never Smoker  . Smokeless tobacco: Never Used  Substance and Sexual Activity  . Alcohol use: Yes    Comment: socially  . Drug use: Yes    Types: Marijuana  . Sexual activity: Not on file  Lifestyle  . Physical activity    Days per week: Not on file    Minutes per session: Not on file   . Stress: Not on file  Relationships  . Social Herbalist on phone: Not on file    Gets together: Not on file    Attends religious service: Not on file    Active member of club or organization: Not on file    Attends meetings of clubs or organizations: Not on file    Relationship status: Not on file  Other Topics Concern  . Not on file  Social History Narrative  . Not on file    Hospital Course: Contact made with his act team to confirm medication plan.  Patient did require another Invega long-acting shot during his hospital stay.  Started him on Seroquel and Depakote with Seroquel dose gradually titrated up.  Therapeutic Depakote level.  Behavior improved and he became less intrusive and less agitated.  Tolerated medicines without difficulty.  Showed good insight.  Showed no dangerous or aggressive behavior in the hospital.  Attempts are being made to hook him up with a act team provider in the area  Physical Findings: AIMS:  , ,  ,  ,    CIWA:    COWS:     Musculoskeletal: Strength & Muscle Tone: within normal limits Gait & Station: normal Patient leans: N/A  Psychiatric Specialty Exam: Physical Exam  Nursing note and vitals reviewed. Constitutional: He appears well-developed  and well-nourished.  HENT:  Head: Normocephalic and atraumatic.  Eyes: Pupils are equal, round, and reactive to light. Conjunctivae are normal.  Neck: Normal range of motion.  Cardiovascular: Regular rhythm and normal heart sounds.  Respiratory: Effort normal.  GI: Soft.  Musculoskeletal: Normal range of motion.  Neurological: He is alert.  Skin: Skin is warm and dry.  Psychiatric: Judgment and thought content normal. His mood appears anxious. His speech is delayed. He is slowed. Cognition and memory are impaired.    Review of Systems  Constitutional: Negative.   HENT: Negative.   Eyes: Negative.   Respiratory: Negative.   Cardiovascular: Negative.   Gastrointestinal: Negative.    Musculoskeletal: Negative.   Skin: Negative.   Neurological: Negative.   Psychiatric/Behavioral: Negative.     Blood pressure (!) 156/87, pulse 86, temperature 98.5 F (36.9 C), temperature source Oral, resp. rate 18, height 5\' 7"  (1.702 m), weight 87.1 kg, SpO2 100 %.Body mass index is 30.07 kg/m.  General Appearance: Casual  Eye Contact:  Good  Speech:  Clear and Coherent  Volume:  Normal  Mood:  Euthymic  Affect:  Congruent  Thought Process:  Goal Directed  Orientation:  Full (Time, Place, and Person)  Thought Content:  Logical  Suicidal Thoughts:  No  Homicidal Thoughts:  No  Memory:  Immediate;   Fair Recent;   Fair Remote;   Fair  Judgement:  Fair  Insight:  Fair  Psychomotor Activity:  Normal  Concentration:  Concentration: Fair  Recall:  Fair  Fund of Knowledge:  Fair  Language:  Fair  Akathisia:  No  Handed:  Right  AIMS (if indicated):     Assets:  Desire for Improvement Housing Physical Health  ADL's:  Intact  Cognition:  Impaired,  Mild  Sleep:  Number of Hours: 6.25     Have you used any form of tobacco in the last 30 days? (Cigarettes, Smokeless Tobacco, Cigars, and/or Pipes): Yes  Has this patient used any form of tobacco in the last 30 days? (Cigarettes, Smokeless Tobacco, Cigars, and/or Pipes) Yes, Yes, A prescription for an FDA-approved tobacco cessation medication was offered at discharge and the patient refused  Blood Alcohol level:  Lab Results  Component Value Date   ETH <10 08/09/2018   ETH <10 08/02/2018    Metabolic Disorder Labs:  No results found for: HGBA1C, MPG No results found for: PROLACTIN Lab Results  Component Value Date   CHOL 134 08/09/2018   TRIG 99 08/09/2018   HDL 39 (L) 08/09/2018   CHOLHDL 3.4 08/09/2018   VLDL 20 08/09/2018   LDLCALC 75 08/09/2018    See Psychiatric Specialty Exam and Suicide Risk Assessment completed by Attending Physician prior to discharge.  Discharge destination:  Home  Is patient on  multiple antipsychotic therapies at discharge:  Yes,   Do you recommend tapering to monotherapy for antipsychotics?  No   Has Patient had three or more failed trials of antipsychotic monotherapy by history:  Yes,   Antipsychotic medications that previously failed include:   1.  Invega., 2.  Haldol. and 3.  Zyprexa.  Recommended Plan for Multiple Antipsychotic Therapies: NA  Discharge Instructions    Diet - low sodium heart healthy   Complete by: As directed    Increase activity slowly   Complete by: As directed      Allergies as of 08/17/2018      Reactions   Vicodin [hydrocodone-acetaminophen] Itching      Medication List  STOP taking these medications   Caplyta 42 MG Caps Generic drug: Lumateperone Tosylate     TAKE these medications     Indication  divalproex 500 MG DR tablet Commonly known as: DEPAKOTE Take 3 tablets (1,500 mg total) by mouth at bedtime.  Indication: Schizophrenia   Invega Sustenna 234 MG/1.5ML Susy injection Generic drug: paliperidone INJECT 1.5MLS (234MG ) INTRAMUSCULARLY EVERY 2 WEEKS Start taking on: August 26, 2018 What changed: These instructions start on August 26, 2018. If you are unsure what to do until then, ask your doctor or other care provider.  Indication: Schizophrenia   QUEtiapine 300 MG tablet Commonly known as: SEROQUEL Take 1 tablet (300 mg total) by mouth at bedtime.  Indication: Schizophrenia   traZODone 50 MG tablet Commonly known as: DESYREL Take 1 tablet (50 mg total) by mouth at bedtime.  Indication: Trouble Sleeping      Follow-up Information    [facility] Follow up.   Why: Your ACTT team at [facility] has referred you to ACTT services in [place]. Please follow [facility] with [facility] regarding your referrals. Thank You! Contact information: [address] [place], [place] [address] Phone: [phone]) [phone] Fax: [phone]          Follow-up recommendations:  Activity:  Activity as  tolerated Diet:  Regular diet Other:  Follow-up with act team once arranged  Comments: Prescriptions and supply of medicine given at discharge  Signed: Mordecai RasmussenJohn , MD 08/17/2018, 6:34 PM

## 2018-08-17 NOTE — Progress Notes (Signed)
Recreation Therapy Notes  INPATIENT RECREATION TR PLAN  Patient Details Name: Kenneth Jensen MRN: [id number] DOB: [birth year] Today's Date: 08/17/2018  Rec Therapy Plan Is patient appropriate for Therapeutic Recreation?: Yes Treatment times per week: at least 3 Estimated Length of Stay: 5-7 days TR Treatment/Interventions: Group participation (Comment)  Discharge Criteria Pt will be discharged from therapy if:: Discharged Treatment plan/goals/alternatives discussed and agreed upon by:: Patient/family  Discharge Summary Short term goals set: Patient will engage in groups without prompting or encouragement from LRT x3 group sessions within 5 recreation therapy group sessions Short term goals met: Adequate for discharge Progress toward goals comments: Groups attended Which groups?: Coping skills, Self-esteem, Other (Comment)(Problem Solving) Reason goals not met: N/A Therapeutic equipment acquired: N/A Reason patient discharged from therapy: Discharge from hospital Pt/family agrees with progress & goals achieved: Yes Date patient discharged from therapy: 08/17/18   Paula Zietz 08/17/2018, 3:00 PM

## 2018-08-17 NOTE — Progress Notes (Signed)
  [facility] Adult Case Management Discharge Plan :  Will you be returning to the same living situation after discharge:  Yes,  home with cousin At discharge, do you have transportation home?: Yes,  taxi Do you have the ability to pay for your medications: Yes,  medicare and medicaid  Release of information consent forms completed and in the chart;   Patient to Follow up at: Follow-up Information    [facility] Follow up.   Why: Your ACTT team at [facility] has referred you to [facility] in [place]. Please follow up with [facility] regarding your referrals. Thank You! Contact information: [address] [place], [place] [address] Phone: [phone] Fax: [phone]          Next level of care provider has access to [place] and Suicide Prevention discussed: Yes,  SPE completed with pts uncle  Have you used any form of tobacco in the last 30 days? (Cigarettes, Smokeless Tobacco, Cigars, and/or Pipes): Yes  Has patient been referred to the Quitline?: Patient refused referral  Patient has been referred for addiction treatment: [place], LCSW 08/17/2018, 9:50 AM

## 2018-08-17 NOTE — Progress Notes (Signed)
Recreation Therapy Notes   Date: 08/17/2018  Time: 9:30 am  Location: Craft Room  Behavioral response: Redirection needed   Intervention Topic: Coping-skills  Discussion/Intervention:  Group content on today was focused on coping skills. The group defined what coping skills are and when they can be used. Individuals described how they normally cope with thing and the coping skills they normally use. Patients expressed why it is important to cope with things and how not coping with things can affect you. The group participated in the intervention "Exploring coping skills" where they had a chance to test new coping skills they could use in the future.  Clinical Observations/Feedback:  Patient came to group and made rude remarks to another peer; he was redirected by group facilitator. Participant stated that counting to 50 is a coping skills he uses. Individual was social with peers and staff while participating in the intervention.  Misako Roeder LRT/CTRS          Jettson Crable 08/17/2018 12:31 PM

## 2018-09-03 ENCOUNTER — Other Ambulatory Visit: Payer: Self-pay

## 2018-09-03 ENCOUNTER — Emergency Department
Admission: EM | Admit: 2018-09-03 | Discharge: 2018-09-14 | Disposition: A | Discharge status: Home / Self Care | Payer: Medicare PPO | Attending: Emergency Medicine | Admitting: Emergency Medicine

## 2018-09-03 DIAGNOSIS — J45909 Unspecified asthma, uncomplicated: Secondary | ICD-10-CM | POA: Diagnosis not present

## 2018-09-03 DIAGNOSIS — F1210 Cannabis abuse, uncomplicated: Secondary | ICD-10-CM | POA: Diagnosis not present

## 2018-09-03 DIAGNOSIS — K5900 Constipation, unspecified: Secondary | ICD-10-CM | POA: Diagnosis not present

## 2018-09-03 DIAGNOSIS — F203 Undifferentiated schizophrenia: Secondary | ICD-10-CM | POA: Diagnosis not present

## 2018-09-03 DIAGNOSIS — Z20828 Contact with and (suspected) exposure to other viral communicable diseases: Secondary | ICD-10-CM | POA: Diagnosis not present

## 2018-09-03 DIAGNOSIS — F201 Disorganized schizophrenia: Secondary | ICD-10-CM | POA: Diagnosis present

## 2018-09-03 DIAGNOSIS — Z046 Encounter for general psychiatric examination, requested by authority: Secondary | ICD-10-CM | POA: Diagnosis present

## 2018-09-03 DIAGNOSIS — F209 Schizophrenia, unspecified: Secondary | ICD-10-CM | POA: Diagnosis not present

## 2018-09-03 DIAGNOSIS — Z79899 Other long term (current) drug therapy: Secondary | ICD-10-CM | POA: Insufficient documentation
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
## 2018-09-03 NOTE — ED Notes (Signed)
Pt requesting something to put in his "booty hole" to make his stomach feel better.

## 2018-09-04 ENCOUNTER — Emergency Department: Payer: Medicare PPO

## 2018-09-04 DIAGNOSIS — F209 Schizophrenia, unspecified: Secondary | ICD-10-CM | POA: Diagnosis not present

## 2018-09-04 LAB — URINALYSIS, COMPLETE (UACMP) WITH MICROSCOPIC
Bacteria, UA: NONE SEEN
Bilirubin Urine: NEGATIVE
Glucose, UA: NEGATIVE mg/dL
Hgb urine dipstick: NEGATIVE
Ketones, ur: NEGATIVE mg/dL
Leukocytes,Ua: NEGATIVE
Nitrite: NEGATIVE
Protein, ur: NEGATIVE mg/dL
Specific Gravity, Urine: 1.012 (ref 1.005–1.030)
pH: 8 (ref 5.0–8.0)

## 2018-09-04 LAB — SALICYLATE LEVEL: Salicylate Lvl: <7 mg/dL (ref 2.8–30.0)

## 2018-09-04 LAB — CBC WITH DIFFERENTIAL/PLATELET
Abs Immature Granulocytes: 0.19 10*3/uL — ABNORMAL HIGH (ref 0.00–0.07)
Abs Immature Granulocytes: 0.09 10*3/uL — ABNORMAL HIGH (ref 0.00–0.07)
Basophils Absolute: 0.1 10*3/uL (ref 0.0–0.1)
Basophils Absolute: 0 10*3/uL (ref 0.0–0.1)
Basophils Relative: 0 %
Basophils Relative: 0 %
Eosinophils Absolute: 0.1 10*3/uL (ref 0.0–0.5)
Eosinophils Absolute: 0.1 10*3/uL (ref 0.0–0.5)
Eosinophils Relative: 0 %
Eosinophils Relative: 1 %
HCT: 41 % (ref 39.0–52.0)
HCT: 38.4 % — ABNORMAL LOW (ref 39.0–52.0)
Hemoglobin: 13.4 g/dL (ref 13.0–17.0)
Hemoglobin: 13 g/dL (ref 13.0–17.0)
Immature Granulocytes: 1 %
Immature Granulocytes: 1 %
Lymphocytes Relative: 9 %
Lymphocytes Relative: 12 %
Lymphs Abs: 2.1 10*3/uL (ref 0.7–4.0)
Lymphs Abs: 2.2 10*3/uL (ref 0.7–4.0)
MCH: 30 pg (ref 26.0–34.0)
MCH: 29.7 pg (ref 26.0–34.0)
MCHC: 32.7 g/dL (ref 30.0–36.0)
MCHC: 33.9 g/dL (ref 30.0–36.0)
MCV: 91.7 fL (ref 80.0–100.0)
MCV: 87.7 fL (ref 80.0–100.0)
Monocytes Absolute: 2.8 10*3/uL — ABNORMAL HIGH (ref 0.1–1.0)
Monocytes Absolute: 2.6 10*3/uL — ABNORMAL HIGH (ref 0.1–1.0)
Monocytes Relative: 12 %
Monocytes Relative: 14 %
Neutro Abs: 18.9 10*3/uL — ABNORMAL HIGH (ref 1.7–7.7)
Neutro Abs: 13.6 10*3/uL — ABNORMAL HIGH (ref 1.7–7.7)
Neutrophils Relative %: 78 %
Neutrophils Relative %: 72 %
Platelets: 318 10*3/uL (ref 150–400)
Platelets: 309 10*3/uL (ref 150–400)
RBC: 4.47 MIL/uL (ref 4.22–5.81)
RBC: 4.38 MIL/uL (ref 4.22–5.81)
RDW: 13.4 % (ref 11.5–15.5)
RDW: 13.3 % (ref 11.5–15.5)
WBC: 24.1 10*3/uL — ABNORMAL HIGH (ref 4.0–10.5)
WBC: 18.5 10*3/uL — ABNORMAL HIGH (ref 4.0–10.5)
nRBC: 0 % (ref 0.0–0.2)
nRBC: 0 % (ref 0.0–0.2)

## 2018-09-04 LAB — CBC
HCT: 41.3 % (ref 39.0–52.0)
Hemoglobin: 13.4 g/dL (ref 13.0–17.0)
MCH: 29.6 pg (ref 26.0–34.0)
MCHC: 32.4 g/dL (ref 30.0–36.0)
MCV: 91.4 fL (ref 80.0–100.0)
Platelets: 319 10*3/uL (ref 150–400)
RBC: 4.52 MIL/uL (ref 4.22–5.81)
RDW: 13.4 % (ref 11.5–15.5)
WBC: 24.8 10*3/uL — ABNORMAL HIGH (ref 4.0–10.5)
nRBC: 0 % (ref 0.0–0.2)

## 2018-09-04 LAB — URINE DRUG SCREEN, QUALITATIVE (ARMC ONLY)
Amphetamines, Ur Screen: NOT DETECTED
Barbiturates, Ur Screen: NOT DETECTED
Benzodiazepine, Ur Scrn: NOT DETECTED
Cannabinoid 50 Ng, Ur ~~LOC~~: POSITIVE — AB
Cocaine Metabolite,Ur ~~LOC~~: NOT DETECTED
MDMA (Ecstasy)Ur Screen: NOT DETECTED
Methadone Scn, Ur: NOT DETECTED
Opiate, Ur Screen: NOT DETECTED
Phencyclidine (PCP) Ur S: NOT DETECTED
Tricyclic, Ur Screen: NOT DETECTED

## 2018-09-04 LAB — COMPREHENSIVE METABOLIC PANEL
ALT: 14 U/L (ref 0–44)
AST: 22 U/L (ref 15–41)
Albumin: 3.7 g/dL (ref 3.5–5.0)
Alkaline Phosphatase: 61 U/L (ref 38–126)
Anion gap: 12 (ref 5–15)
BUN: 11 mg/dL (ref 6–20)
CO2: 22 mmol/L (ref 22–32)
Calcium: 8.7 mg/dL — ABNORMAL LOW (ref 8.9–10.3)
Chloride: 102 mmol/L (ref 98–111)
Creatinine, Ser: 0.83 mg/dL (ref 0.61–1.24)
GFR calc Af Amer: >60 mL/min (ref >60)
GFR calc non Af Amer: >60 mL/min (ref >60)
Glucose, Bld: 112 mg/dL — ABNORMAL HIGH (ref 70–99)
Potassium: 4.1 mmol/L (ref 3.5–5.1)
Sodium: 136 mmol/L (ref 135–145)
Total Bilirubin: 0.7 mg/dL (ref 0.3–1.2)
Total Protein: 6.7 g/dL (ref 6.5–8.1)

## 2018-09-04 LAB — ACETAMINOPHEN LEVEL: Acetaminophen (Tylenol), Serum: <10 ug/mL — ABNORMAL LOW (ref 10–30)

## 2018-09-04 LAB — ETHANOL: Alcohol, Ethyl (B): <10 mg/dL (ref <10)

## 2018-09-04 LAB — VALPROIC ACID LEVEL: Valproic Acid Lvl: 79 ug/mL (ref 50.0–100.0)

## 2018-09-04 LAB — SARS CORONAVIRUS 2 BY RT PCR (HOSPITAL ORDER, PERFORMED IN ~~LOC~~ HOSPITAL LAB): SARS Coronavirus 2: NEGATIVE

## 2018-09-04 MED ORDER — LACTULOSE 10 GM/15ML PO SOLN: 20.0000 g | Freq: Every day | ORAL | 0 refills | Status: DC | PRN

## 2018-09-04 MED ORDER — IOHEXOL 240 MG/ML SOLN
25.0000 mL | INTRAMUSCULAR | Status: AC
  Administered 2018-09-04 (×2): 25 mL via ORAL

## 2018-09-04 MED ORDER — IBUPROFEN 600 MG PO TABS
600.0000 mg | ORAL_TABLET | Freq: Once | ORAL | Status: AC
  Administered 2018-09-04: 600 mg via ORAL
  Filled 2018-09-04: qty 1

## 2018-09-04 NOTE — ED Notes (Signed)
Pt on phone speaking with SANE nurse at this time.

## 2018-09-04 NOTE — ED Notes (Signed)
He is awake - assistance provided to him with making a phone call  He has requested social work to come back  - I called and informed her that he is ready to talk with her

## 2018-09-04 NOTE — ED Notes (Signed)
He is requesting someone to get in touch with his mother  He states  "Call my momma and tell her that I ain't smoking pot anymore so ask if I can stay with her"  I searched history but could not find his mothers information  Pt informed

## 2018-09-04 NOTE — ED Notes (Signed)
Pt. Awake in bed.  Pt. States lt. Arm sore, unsure reason,  Denies injury.  No obvious deformity or swelling.  Pt. States upper lt. Arm region, deltoid.  Pt. Calm and cooperative.  Pt. Denies SI/HI.
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
## 2018-09-04 NOTE — ED Notes (Signed)
Pt has been given the 2nd bottle of oral contrast at this time.

## 2018-09-04 NOTE — ED Notes (Signed)
BEHAVIORAL HEALTH ROUNDING Patient sleeping: Yes.   Patient alert and oriented: eyes closed  Appears asleep Behavior appropriate: Yes.  ; If no, describe:  Nutrition and fluids offered: Yes  Toileting and hygiene offered: sleeping Sitter present: q 15 minute observations and security camera monitoring  

## 2018-09-04 NOTE — ED Notes (Signed)
Pt transferred into ED BHU room 4   Patient assigned to appropriate care area. Patient oriented to unit/care area: Informed that, for his safety, care areas are designed for safety and monitored by security cameras at all times; Visiting hours and phone times explained to patient. Patient verbalizes understanding, and verbal contract for safety obtained.    Assessment completed  He denies pain  He is cleared for discharge and social work consult is in place to assist him in finding a safe place to live

## 2018-09-04 NOTE — ED Notes (Signed)
Social work called.  Message left.

## 2018-09-04 NOTE — ED Notes (Signed)
He is currently standing in the dayroom talking on the phone   [phone] number dialed for him  Pt can be heard talking  "I am going to be here until I get me a group home"

## 2018-09-04 NOTE — ED Notes (Signed)
BEHAVIORAL HEALTH ROUNDING Patient sleeping: No. Patient alert and oriented: yes Behavior appropriate: Yes.  ; If no, describe:  Nutrition and fluids offered: yes Toileting and hygiene offered: Yes  Sitter present: q15 minute observations and security camera monitoring   

## 2018-09-04 NOTE — ED Notes (Signed)
Patient transported to CT at this time. 

## 2018-09-04 NOTE — ED Notes (Signed)
Patient transported to X-ray at this time 

## 2018-09-04 NOTE — ED Notes (Signed)
Pt. Requested and was given new scrub pants and underwear.

## 2018-09-04 NOTE — Social Work (Signed)
TOC CM/SW received call from Gwynn Burly regarding disposition.  23 SW will begin assessment and search for group home.      Berenice Bouton, MSW, LCSW  CSW ED # [phone]

## 2018-09-04 NOTE — Social Work (Signed)
TOC CM/SW attempted to initiate initial assessment for possible group home placement. Patient noted that "I am still trying to rest.  Can you come back latter."  1401  Plan: Follow-up at 1500 hour.    Berenice Bouton, MSW, LCSW  [phone] 8am-6pm (weekends) or CSW ED # [phone]

## 2018-09-04 NOTE — Discharge Instructions (Addendum)
Continue daily medicines as directed by your doctor.  You may take Lactulose as needed for bowel movements.  Return to the ER for worsening symptoms, persistent vomiting, difficulty breathing or other concerns.
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
## 2018-09-04 NOTE — ED Notes (Addendum)
SOC states pt does not require psychiatric inpatient treatment because he appears to be at baseline. Tele-psych provider does recommend a social work/ACT team consult to address the need for possible group home placement ([place] reports pt is unhappy with his current living arrangements and requesting to be placed elsewhere).

## 2018-09-04 NOTE — ED Notes (Signed)
VOL/Pending Group Home placement

## 2018-09-04 NOTE — ED Notes (Signed)
CT made aware at this time that pt has finished his 2nd bottle of contrast.

## 2018-09-04 NOTE — ED Notes (Signed)
ED BHU [place] Is the patient under IVC or is there intent for IVC:  voluntary Is the patient medically cleared: Yes.   Is there vacancy in the ED BHU: Yes.   Is the population mix appropriate for patient: Yes.   Is the patient awaiting placement in inpatient or outpatient setting: Yes.  Social work consult is in progress  Has the patient had a psychiatric consult: Yes.  He is cleared psychiatrically and medically  He is a boarder/placement  Survey of unit performed for contraband, proper placement and condition of furniture, tampering with fixtures in bathroom, shower, and each patient room: Yes.  ; Findings:  APPEARANCE/BEHAVIOR Calm and cooperative NEURO ASSESSMENT Orientation: oriented x3  Denies pain Hallucinations: No.None noted (Hallucinations) denies at this time  Speech: Normal Gait: normal RESPIRATORY ASSESSMENT Even  Unlabored respirations  CARDIOVASCULAR ASSESSMENT Pulses equal   regular rate  Skin warm and dry   GASTROINTESTINAL ASSESSMENT no GI complaint EXTREMITIES Full ROM  PLAN OF CARE Provide calm/safe environment. Vital signs assessed twice daily. ED BHU Assessment once each 12-hour shift. Collaborate with TTSif available.  Assure the ED provider has rounded once each shift. Provide and encourage hygiene. Provide redirection as needed. Assess for escalating behavior; address immediately and inform ED provider.  Assess family dynamic and appropriateness for visitation as needed: Yes.  ; If necessary, describe findings:  Educate the patient/family about BHU procedures/visitation: Yes.  ; If necessary, describe findings:

## 2018-09-05 DIAGNOSIS — F209 Schizophrenia, unspecified: Secondary | ICD-10-CM | POA: Diagnosis not present

## 2018-09-05 MED ORDER — QUETIAPINE FUMARATE 25 MG PO TABS
300.0000 mg | ORAL_TABLET | Freq: Every day | ORAL | Status: DC
  Administered 2018-09-05 – 2018-09-13 (×9): 300 mg via ORAL
  Filled 2018-09-05 (×9): qty 1

## 2018-09-05 MED ORDER — DIVALPROEX SODIUM 500 MG PO DR TAB
750.0000 mg | DELAYED_RELEASE_TABLET | Freq: Two times a day (BID) | ORAL | Status: DC
  Administered 2018-09-05 – 2018-09-13 (×18): 750 mg via ORAL
  Filled 2018-09-05 (×19): qty 1

## 2018-09-05 MED ORDER — NICOTINE 14 MG/24HR TD PT24
14.0000 mg | MEDICATED_PATCH | Freq: Once | TRANSDERMAL | Status: AC
  Administered 2018-09-05: 19:00:00 14 mg via TRANSDERMAL
  Filled 2018-09-05: qty 1

## 2018-09-05 MED ORDER — OLANZAPINE 5 MG PO TBDP
10.0000 mg | ORAL_TABLET | Freq: Once | ORAL | Status: AC
  Administered 2018-09-05: 10:00:00 10 mg via ORAL
  Filled 2018-09-05: qty 2

## 2018-09-05 MED ORDER — TRAZODONE HCL 50 MG PO TABS
50.0000 mg | ORAL_TABLET | Freq: Every day | ORAL | Status: DC
  Administered 2018-09-05 – 2018-09-13 (×9): 50 mg via ORAL
  Filled 2018-09-05 (×9): qty 1

## 2018-09-05 MED ORDER — DIVALPROEX SODIUM 500 MG PO DR TAB: 1500.0000 mg | DELAYED_RELEASE_TABLET | Freq: Every day | ORAL | Status: DC

## 2018-09-05 NOTE — ED Notes (Signed)
Pt resting in room at this time. Pt was given apple juice and a snack. Pt is calm and watching tv.

## 2018-09-05 NOTE — ED Notes (Signed)
Hourly rounding reveals patient in room. No complaints, stable, in no acute distress. Q15 minute rounds and monitoring via Security Cameras to continue. 

## 2018-09-05 NOTE — ED Notes (Signed)
Writer saw another patient attempt to go into this patients room, Therapist, sports stopped the patient from going into the room. When writer told the patients they were not allowed to be in each other room, patient said "why I thought this was his room and im visitingNurse, mental health and officer asked them to please remove themselves and they each went into the dayroom

## 2018-09-05 NOTE — ED Notes (Signed)
Patient just woke up and asked to take a shower

## 2018-09-05 NOTE — ED Notes (Signed)
Patient given his breakfast tray.
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
## 2018-09-05 NOTE — ED Provider Notes (Signed)
-----------------------------------------   5:48 AM on 09/05/2018 -----------------------------------------   Blood pressure 132/73, pulse (!) 102, temperature 98.1 F (36.7 C), temperature source Oral, resp. rate 18, height 5\' 6"  (1.676 m), weight 86.2 kg, SpO2 100 %.  The patient had no acute events since last update.  Calm and cooperative at this time.  Disposition is pending per Psychiatry/Behavioral Medicine team recommendations.     Rudene Re, MD 09/05/18 [phone]

## 2018-09-05 NOTE — ED Notes (Signed)
This tech gave pt dinner tray. 
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
## 2018-09-05 NOTE — ED Notes (Signed)
Transferred to room BHU 4 by [place], [place].

## 2018-09-05 NOTE — ED Notes (Signed)
Report to include Situation, Background, Assessment, and Recommendations received from Jadeka RN. Patient alert and oriented, warm and dry, in no acute distress. Patient denies SI, HI, AVH and pain. Patient made aware of Q15 minute rounds and security cameras for their safety. Patient instructed to come to me with needs or concerns. 

## 2018-09-05 NOTE — ED Notes (Signed)
Hourly rounding reveals patient sleeping in room. No complaints, stable, in no acute distress. Q15 minute rounds and monitoring via [employer] to continue. 

## 2018-09-05 NOTE — ED Notes (Signed)
Pt is sleeping at this time.

## 2018-09-05 NOTE — ED Notes (Signed)
VOL/Pending Placement/ Moved to BHU-4

## 2018-09-06 DIAGNOSIS — F209 Schizophrenia, unspecified: Secondary | ICD-10-CM | POA: Diagnosis not present

## 2018-09-06 NOTE — ED Notes (Signed)
Hourly rounding reveals patient sleeping in room. No complaints, stable, in no acute distress. Q15 minute rounds and monitoring via [employer] to continue. 

## 2018-09-06 NOTE — ED Notes (Signed)
BEHAVIORAL HEALTH ROUNDING Patient sleeping: No. Patient alert and oriented: yes Behavior appropriate: Yes.  ; If no, describe:  Nutrition and fluids offered: yes Toileting and hygiene offered: Yes  Sitter present: q15 minute observations and security camera monitoring   

## 2018-09-06 NOTE — ED Notes (Signed)
ED BHU [place] Is the patient under IVC or is there intent for IVC: voluntary Is the patient medically cleared: Yes.   Is there vacancy in the ED BHU: Yes.   Is the population mix appropriate for patient: Yes.   Is the patient awaiting placement in inpatient or outpatient setting: Yes.   Has the patient had a psychiatric consult: Yes.   Survey of unit performed for contraband, proper placement and condition of furniture, tampering with fixtures in bathroom, shower, and each patient room: Yes.  ; Findings:  APPEARANCE/BEHAVIOR Calm and cooperative NEURO ASSESSMENT Orientation: oriented x3  Denies pain Hallucinations: No.None noted (Hallucinations)  Denies at this time  Speech: Normal Gait: normal RESPIRATORY ASSESSMENT Even  Unlabored respirations  CARDIOVASCULAR ASSESSMENT Pulses equal   regular rate  Skin warm and dry   GASTROINTESTINAL ASSESSMENT no GI complaint EXTREMITIES Full ROM  PLAN OF CARE Provide calm/safe environment. Vital signs assessed twice daily. ED BHU Assessment once each 12-hour shift. Assure the ED provider has rounded once each shift. Provide and encourage hygiene. Provide redirection as needed. Assess for escalating behavior; address immediately and inform ED provider.  Assess family dynamic and appropriateness for visitation as needed: Yes.  ; If necessary, describe findings:  Educate the patient/family about BHU procedures/visitation: Yes.  ; If necessary, describe findings:

## 2018-09-06 NOTE — ED Provider Notes (Signed)
-----------------------------------------   6:12 AM on 09/06/2018 -----------------------------------------   Blood pressure (!) 115/51, pulse 84, temperature 98.9 F (37.2 C), resp. rate 16, height 5\' 6"  (1.676 m), weight 86.2 kg, SpO2 100 %.  The patient is calm and cooperative at this time.  There have been no acute events since the last update.  Awaiting disposition plan from Behavioral Medicine and/or Social Work team(s).   Paulette Blanch, MD 09/06/18 [phone]

## 2018-09-06 NOTE — ED Notes (Signed)
Hourly rounding reveals patient in room. No complaints, stable, in no acute distress. Q15 minute rounds and monitoring via Security Cameras to continue. 

## 2018-09-06 NOTE — ED Notes (Signed)
Given a snack tray and juice 

## 2018-09-06 NOTE — ED Notes (Signed)
Patient given juice upon his request 

## 2018-09-06 NOTE — ED Notes (Signed)
Pt given meal tray.

## 2018-09-06 NOTE — Social Work (Addendum)
CSW contacted patient's uncle Saralyn Pilar, to see if patient has Medicaid, or payor source in order to go to a group home. Patient's uncle states that he is unsure. Patient's uncle shared that he does not believe that patient wants his family that he was living with to know that he is trying to leave.   10:50am- CSW waiting for patient's FL2 to be signed.  [place], [place] ED  [phone]

## 2018-09-06 NOTE — ED Notes (Signed)
Pt given apple juice  

## 2018-09-06 NOTE — ED Notes (Signed)
Report on Situation, Background, Assessment, and Recommendations received from Amy, RN. Patient alert and in no visible distress. Patient made aware of Q15 minute rounds and security cameras for their safety. Patient instructed to come to me with needs or concerns.   

## 2018-09-06 NOTE — ED Notes (Signed)
Patient observed lying in bed with eyes closed  Even, unlabored respirations observed   NAD pt appears to be sleeping  I will continue to monitor along with every 15 minute visual observations and ongoing security camera monitoring    

## 2018-09-06 NOTE — NC FL2 (Signed)
  [place] LEVEL OF CARE SCREENING TOOL     IDENTIFICATION  Patient Name: Kenneth Jensen Birthdate: [birth year] Sex: male Admission Date (Current Location): 09/03/2018  [place] and [place] Number:  Selena Lesser [id number] [place] and Address:  [facility], [address], [place], [place] [address]      Provider Number: [id number]  Attending Physician Name and Address:  No att. providers found  Relative Name and Phone Number:  Encarnacion Slates   [phone]    Current Level of Care: Hospital Recommended Level of Care: Family Care Home(Group Home) Prior Approval Number:    Date Approved/Denied:   PASRR Number:    Discharge Plan: Other (Comment)(Group Home)    Current Diagnoses: Patient Active Problem List   Diagnosis Date Noted  . Schizophrenia ([place]) 08/02/2018    Orientation RESPIRATION BLADDER Height & Weight     Self, Time, Situation, Place  Normal Continent Weight: 190 lb (86.2 kg) Height:  5\' 6"  (167.6 cm)  BEHAVIORAL SYMPTOMS/MOOD NEUROLOGICAL BOWEL NUTRITION STATUS      Continent Diet  AMBULATORY STATUS COMMUNICATION OF NEEDS Skin   Independent Verbally Normal                       Personal Care Assistance Level of Assistance  Bathing, Feeding, Dressing Bathing Assistance: Independent Feeding assistance: Independent Dressing Assistance: Independent     Functional Limitations Info  Sight, Hearing, Speech Sight Info: Adequate Hearing Info: Adequate Speech Info: Adequate    SPECIAL CARE FACTORS FREQUENCY                       Contractures Contractures Info: Not present    Additional Factors Info  Code Status, Allergies Code Status Info: FULL Allergies Info: Percocet,  Vicodin           Current Medications (09/06/2018):  This is the current hospital active medication list Current Facility-Administered Medications  Medication Dose Route Frequency Provider Last Rate Last Dose  . divalproex  (DEPAKOTE) DR tablet 750 mg  750 mg Oral Q12H Patrecia Pour, NP   750 mg at 09/05/18 2151  . nicotine (NICODERM CQ - dosed in mg/24 hours) patch 14 mg  14 mg Transdermal Once Harvest Dark, MD   14 mg at 09/05/18 1847  . QUEtiapine (SEROQUEL) tablet 300 mg  300 mg Oral QHS Patrecia Pour, NP   300 mg at 09/05/18 2151  . traZODone (DESYREL) tablet 50 mg  50 mg Oral QHS Patrecia Pour, NP   50 mg at 09/05/18 2152   Current Outpatient Medications  Medication Sig Dispense Refill  . divalproex (DEPAKOTE) 500 MG DR tablet Take 3 tablets (1,500 mg total) by mouth at bedtime. 90 tablet 2  . INVEGA SUSTENNA 234 MG/1.5ML SUSY injection INJECT 1.5MLS (234MG ) INTRAMUSCULARLY EVERY 2 WEEKS 1.8 mL 4  . lactulose (CHRONULAC) 10 GM/15ML solution Take 30 mLs (20 g total) by mouth daily as needed for mild constipation. 120 mL 0  . QUEtiapine (SEROQUEL) 300 MG tablet Take 1 tablet (300 mg total) by mouth at bedtime. 30 tablet 2  . traZODone (DESYREL) 50 MG tablet Take 1 tablet (50 mg total) by mouth at bedtime. 30 tablet 2     Discharge Medications: Please see discharge summary for a list of discharge medications.  Relevant Imaging Results:  Relevant Lab Results:   Additional Information    Tania Jenny Lai, LCSW

## 2018-09-06 NOTE — ED Notes (Signed)

## 2018-09-06 NOTE — Social Work (Signed)
CSW calling various group homes in the [place] area for bed availibility.  Clinicals have been faxed to [facility].  FAX:   [phone] They will look over it and contact CSW tomorrow.   [place], [place]

## 2018-09-06 NOTE — ED Notes (Signed)
Pt went to the bathroom then informed RN Dominica Severin that he had wet his pants. RN Dominica Severin gave pt new pants and threw the soiled pants in the trash. Pt went back to bed.

## 2018-09-07 DIAGNOSIS — F209 Schizophrenia, unspecified: Secondary | ICD-10-CM | POA: Diagnosis not present

## 2018-09-07 NOTE — ED Notes (Signed)
Report to include Situation, Background, Assessment, and Recommendations received from Wendy RN. Patient alert and oriented, warm and dry, in no acute distress. Patient denies SI, HI, AVH and pain. Patient made aware of Q15 minute rounds and security cameras for their safety. Patient instructed to come to me with needs or concerns.  

## 2018-09-07 NOTE — ED Notes (Signed)
Patient ate 100% of lunch and beverage, no signs of distress, will continue to monitor, denies Si/hi or avh at this time. q 15 minute checks and camera surveillance in progress for safety.

## 2018-09-07 NOTE — ED Notes (Signed)
Hourly rounding reveals patient in hall. No complaints, stable, in no acute distress. Q15 minute rounds and monitoring via Security Cameras to continue. 

## 2018-09-07 NOTE — Consult Note (Addendum)
Patient is seen by this provider face-to-face since he is sitting in the [facility] waiting for placement.  Patient presents calm, pleasant, and cooperative.  Patient reports she has no complaints or concerns at this time except for when is he going to leave.  There have been no complaints or concerns mentioned about this patient from staff.  Based on chart review patient has been compliant with medications thus far.  Social work is still working on getting this patient placed.  Patient has continued to deny any suicidal homicidal ideations and denies any hallucinations.  Patient continues to be psychiatrically cleared and is awaiting placement.

## 2018-09-07 NOTE — ED Provider Notes (Signed)
5:19 AM 09/07/2018  Blood pressure 131/62, pulse (!) 110, temperature 97.8 F (36.6 C), temperature source Oral, resp. rate 20, height 5\' 6"  (1.676 m), weight 86.2 kg, SpO2 100 %.  The patient is calm and cooperative at this time.  There have been no acute events since the last update.  Awaiting disposition plan from Behavioral Medicine team. Pending placement.    Vanessa Lumpkin, MD 09/07/18 [phone]

## 2018-09-07 NOTE — ED Notes (Signed)
Hourly rounding reveals patient in room. No complaints, stable, in no acute distress. Q15 minute rounds and monitoring via Security Cameras to continue. 

## 2018-09-07 NOTE — ED Notes (Signed)
Patient ate 100% of supper and beverage. No signs of distress.  

## 2018-09-07 NOTE — ED Notes (Signed)
Hourly rounding reveals patient sleeping in room. No complaints, stable, in no acute distress. Q15 minute rounds and monitoring via [employer] to continue. 

## 2018-09-07 NOTE — ED Notes (Signed)
Patient is in the dayroom, no signs of distress, using the phone, will continue to monitor.
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
## 2018-09-08 DIAGNOSIS — F203 Undifferentiated schizophrenia: Secondary | ICD-10-CM

## 2018-09-08 DIAGNOSIS — F209 Schizophrenia, unspecified: Secondary | ICD-10-CM | POA: Diagnosis not present

## 2018-09-08 NOTE — ED Notes (Signed)
Pt. Calm and cooperative at this time.  Pt. Requesting access to rest of unit and to talk to other patients in [place].  Opened up both units of BHU at this time.

## 2018-09-08 NOTE — ED Notes (Signed)
BEHAVIORAL HEALTH ROUNDING Patient sleeping: No. Patient alert and oriented: yes Behavior appropriate: Yes.  ; If no, describe:  Nutrition and fluids offered: yes Toileting and hygiene offered: Yes  Sitter present: q15 minute observations and security camera monitoring   

## 2018-09-08 NOTE — ED Notes (Signed)
BEHAVIORAL HEALTH ROUNDING Patient sleeping: Yes.   Patient alert and oriented: eyes closed  Appears asleep Behavior appropriate: Yes.  ; If no, describe:  Nutrition and fluids offered: Yes  Toileting and hygiene offered: sleeping Sitter present: q 15 minute observations and security camera monitoring  

## 2018-09-08 NOTE — ED Notes (Signed)
Gave pt a change of clothes per request. Discarded old clothes from restroom.

## 2018-09-08 NOTE — ED Notes (Signed)
Assistance provided with making phone calls for him - he left messages due to no one answered his calls  "Hey - this is Kenneth Jensen - Please call me back  - please find me a group home in [place]  - I will pay for it with my check - Please call me back"  He states to me that he called his godmother because she is his payee  He verbalizes feeling sad  "I am sad cause nobody cares about me"  Pt reassured

## 2018-09-08 NOTE — ED Notes (Signed)
Hourly rounding reveals patient sleeping in room. No complaints, stable, in no acute distress. Q15 minute rounds and monitoring via [employer] to continue. 

## 2018-09-08 NOTE — ED Notes (Signed)
ED BHU PLACEMENT JUSTIFICATION Is the patient under IVC or is there intent for IVC: Yes.   Is the patient medically cleared: Yes.   Is there vacancy in the ED BHU: Yes.   Is the population mix appropriate for patient: Yes.   Is the patient awaiting placement in inpatient or outpatient setting: Yes.   Has the patient had a psychiatric consult: Yes.   Survey of unit performed for contraband, proper placement and condition of furniture, tampering with fixtures in bathroom, shower, and each patient room: Yes.  ; Findings:  APPEARANCE/BEHAVIOR Calm and cooperative NEURO ASSESSMENT Orientation: oriented x3  Denies pain Hallucinations: No.None noted (Hallucinations) denies Speech: Normal Gait: normal RESPIRATORY ASSESSMENT Even  Unlabored respirations  CARDIOVASCULAR ASSESSMENT Pulses equal   regular rate  Skin warm and dry   GASTROINTESTINAL ASSESSMENT no GI complaint EXTREMITIES Full ROM  PLAN OF CARE Provide calm/safe environment. Vital signs assessed twice daily. ED BHU Assessment once each 12-hour shift.  Assure the ED provider has rounded once each shift. Provide and encourage hygiene. Provide redirection as needed. Assess for escalating behavior; address immediately and inform ED provider.  Assess family dynamic and appropriateness for visitation as needed: Yes.  ; If necessary, describe findings:  Educate the patient/family about BHU procedures/visitation: Yes.  ; If necessary, describe findings:   

## 2018-09-08 NOTE — TOC Progression Note (Addendum)
Transition of Care [facility]) - Progression Note    Patient Details  Name: Kenneth Jensen MRN: [id number] Date of Birth: March 19, 1990  Transition of Care [facility]) CM/SW Contact  Tania Quasim Doyon, LCSW Phone Number: 09/08/2018, 9:57 AM  Clinical Narrative:     Owner of [facility] stated that she was off yesterday and will look over patients information today. She will call CSW back later.   10:30am - [facility] stated that patient is too young.  CSW still waiting on call back from [facility].   11:30am - Patient also faxed out to [facility], and [facility] for review   1:00pm - [facility] declined.    Expected Discharge Plan: Group Home Barriers to Discharge: Other (comment)(Waiting for group home offers)  Expected Discharge Plan and Services Expected Discharge Plan: Group Home                                               Social Determinants of Health (SDOH) Interventions    Readmission Risk Interventions No flowsheet data found.

## 2018-09-08 NOTE — ED Notes (Signed)

## 2018-09-08 NOTE — Consult Note (Addendum)
Patient was seen by this provider face-to-face.  Patient is pleasant, calm, and cooperative.  Patient continues to deny any suicidal or homicidal ideations and denies any hallucinations.  Patient has remained calm on the unit and there have been no issues or complaints about this patient.  Patient did request to be moved out of his room to another room and the request was denied.  Patient was informed that he would stay in the room that he is in for safety reasons.  Patient stated understanding and agreement but did not seem to be very pleased with the decision.  However patient did not act out.  Patient continues to remain psychiatric cleared and social worker is continuing to seek placement.  Case discussed and plan agreed upon with Mr Money

## 2018-09-08 NOTE — ED Provider Notes (Signed)
-----------------------------------------   5:31 AM on 09/08/2018 -----------------------------------------   Blood pressure 128/72, pulse 91, temperature 98.9 F (37.2 C), temperature source Oral, resp. rate 18, height 1.676 m (5\' 6" ), weight 86.2 kg, SpO2 100 %.  The patient is calm and cooperative at this time.  There have been no acute events since the last update.  Awaiting disposition plan from Behavioral Medicine and/or Social Work team(s).   Hinda Kehr, MD 09/08/18 [phone]

## 2018-09-08 NOTE — ED Notes (Signed)
Pt. Given meal tray and additional crackers and PB.  Pt. Also given chocolate milk and soda.

## 2018-09-08 NOTE — ED Notes (Signed)
Patient observed lying in bed with eyes closed  Even, unlabored respirations observed   NAD pt appears to be sleeping  I will continue to monitor along with every 15 minute visual observations and ongoing security camera monitoring    

## 2018-09-08 NOTE — ED Notes (Signed)
Reviewed VOL/Pending Placement

## 2018-09-08 NOTE — ED Notes (Signed)
Dinner tray given to pt

## 2018-09-09 DIAGNOSIS — F209 Schizophrenia, unspecified: Secondary | ICD-10-CM | POA: Diagnosis not present

## 2018-09-09 NOTE — ED Notes (Signed)
Pt requested phone call. Gave pt phone for his daily call within phone limits.

## 2018-09-09 NOTE — TOC Progression Note (Signed)
Transition of Care [facility]) - Progression Note    Patient Details  Name: Kenneth Jensen MRN: [id number] Date of Birth: 01/30/1990  Transition of Care [facility]) CM/SW Contact  Tania Asheton Scheffler, LCSW Phone Number: 09/09/2018, 12:35 PM  Clinical Narrative:     CSW waiting for return call from [facility] group home.    Expected Discharge Plan: Group Home Barriers to Discharge: Other (comment)(Waiting for group home offers)  Expected Discharge Plan and Services Expected Discharge Plan: Group Home                                               Social Determinants of Health (SDOH) Interventions    Readmission Risk Interventions No flowsheet data found.

## 2018-09-09 NOTE — ED Notes (Signed)
Gave pt food tray with juice. 

## 2018-09-09 NOTE — ED Notes (Signed)
Hourly rounding reveals patient sleeping in room. No complaints, stable, in no acute distress. Q15 minute rounds and monitoring via [employer] to continue. 

## 2018-09-09 NOTE — ED Notes (Signed)
Gave pt graham crackers,peanut butter, and grape juice.

## 2018-09-09 NOTE — ED Provider Notes (Signed)
-----------------------------------------   3:53 AM on 09/09/2018 -----------------------------------------   Blood pressure 134/76, pulse 96, temperature 98 F (36.7 C), temperature source Oral, resp. rate 18, height 5\' 6"  (1.676 m), weight 86.2 kg, SpO2 100 %.  The patient had no acute events since last update.  Calm and cooperative at this time.  Disposition is pending per Psychiatry/Behavioral Medicine team recommendations.     Alfred Levins, [place], MD 09/09/18 [phone]

## 2018-09-09 NOTE — ED Notes (Signed)
Reviewed VOL/Pending Group Home Placement

## 2018-09-10 DIAGNOSIS — F209 Schizophrenia, unspecified: Secondary | ICD-10-CM | POA: Diagnosis not present

## 2018-09-10 MED ORDER — NICOTINE POLACRILEX 2 MG MT GUM
2.0000 mg | CHEWING_GUM | OROMUCOSAL | Status: DC | PRN
  Administered 2018-09-10 – 2018-09-11 (×3): 2 mg via ORAL
  Filled 2018-09-10 (×4): qty 1

## 2018-09-10 NOTE — ED Notes (Signed)
Pt. Came to nursing station requesting soda and crackers.  Pt. Advised snack time ended at 10 pm.  Pt. Given cup of water and ice.  Pt. Also requested to use bathroom.  Bathroom unlocked pt. Used bathroom and returned to room.

## 2018-09-10 NOTE — ED Notes (Signed)
Writer sent a message to Education officer, museum on call to have her talk with patient about his status. Patient is stating he is ready to go and has money to go somewhere.

## 2018-09-10 NOTE — ED Notes (Signed)
Patient using the phone.  

## 2018-09-10 NOTE — ED Notes (Signed)
Hourly rounding reveals patient sleeping in room. No complaints, stable, in no acute distress. Q15 minute rounds and monitoring via [employer] to continue. 

## 2018-09-10 NOTE — ED Notes (Signed)
Pt. Requested and was given meal tray and drink. 

## 2018-09-10 NOTE — ED Notes (Signed)
Pt. Moved to [place] room #6 to [facility] room #4. To open up peds. Unit.

## 2018-09-10 NOTE — ED Notes (Signed)
VOL/Pending Group Home Placement 

## 2018-09-11 DIAGNOSIS — F209 Schizophrenia, unspecified: Secondary | ICD-10-CM | POA: Diagnosis not present

## 2018-09-11 NOTE — ED Notes (Signed)
sw placement to group home

## 2018-09-11 NOTE — ED Provider Notes (Signed)
-----------------------------------------   5:32 AM on 09/11/2018 -----------------------------------------   Blood pressure 130/80, pulse 90, temperature 98 F (36.7 C), temperature source Oral, resp. rate 16, height 5\' 6"  (1.676 m), weight 86.2 kg, SpO2 100 %.  The patient had no acute events since last update.  Calm and cooperative at this time.  Disposition is pending per Psychiatry/Behavioral Medicine team recommendations.     Alfred Levins, [place], MD 09/11/18 [phone]

## 2018-09-11 NOTE — ED Notes (Signed)
Pt is taking a shower. No behavioral issues at this time. Maintained on 15 minute checks and observation by security camera for safety.

## 2018-09-11 NOTE — ED Notes (Signed)
Pt. Given meal tray and drink upon request. 

## 2018-09-11 NOTE — ED Notes (Signed)
Pt. Talking to other patients in common area.  Pt. Calm and cooperative but wanting to go to a group home.

## 2018-09-12 DIAGNOSIS — F209 Schizophrenia, unspecified: Secondary | ICD-10-CM | POA: Diagnosis not present

## 2018-09-12 NOTE — ED Notes (Signed)
ED BHU [place] Is the patient under IVC or is there intent for IVC: voluntary Is the patient medically cleared: Yes.   Is there vacancy in the ED BHU: Yes.   Is the population mix appropriate for patient: Yes.   Is the patient awaiting placement in inpatient or outpatient setting: Yes.  He is wanting a group home in [place]  - he states  "y'all give me a ride to [place] and then I will get a room to stay in"   Has the patient had a psychiatric consult: Yes.   Survey of unit performed for contraband, proper placement and condition of furniture, tampering with fixtures in bathroom, shower, and each patient room: Yes.  ; Findings:  APPEARANCE/BEHAVIOR Calm and cooperative NEURO ASSESSMENT Orientation: oriented x3  Denies pain Hallucinations: No.None noted (Hallucinations) denies  Speech: Normal Gait: normal RESPIRATORY ASSESSMENT Even  Unlabored respirations  CARDIOVASCULAR ASSESSMENT Pulses equal   regular rate  Skin warm and dry   GASTROINTESTINAL ASSESSMENT no GI complaint EXTREMITIES Full ROM  PLAN OF CARE Provide calm/safe environment. Vital signs assessed twice daily. ED BHU Assessment once each 12-hour shift.  Assure the ED provider has rounded once each shift. Provide and encourage hygiene. Provide redirection as needed. Assess for escalating behavior; address immediately and inform ED provider.  Assess family dynamic and appropriateness for visitation as needed: Yes.  ; If necessary, describe findings:  Educate the patient/family about BHU procedures/visitation: Yes.  ; If necessary, describe findings:

## 2018-09-12 NOTE — ED Notes (Signed)
Patient observed lying in bed with eyes closed  Even, unlabored respirations observed   NAD pt appears to be sleeping  I will continue to monitor along with every 15 minute visual observations and ongoing security camera monitoring    

## 2018-09-12 NOTE — ED Notes (Signed)
BEHAVIORAL HEALTH ROUNDING Patient sleeping: No. Patient alert and oriented: yes Behavior appropriate: Yes.  ; If no, describe:  Nutrition and fluids offered: yes Toileting and hygiene offered: Yes  Sitter present: q15 minute observations and security camera monitoring   

## 2018-09-12 NOTE — ED Provider Notes (Signed)
-----------------------------------------   6:27 AM on 09/12/2018 -----------------------------------------   Blood pressure 122/65, pulse 77, temperature 98.2 F (36.8 C), temperature source Oral, resp. rate 18, height 5\' 6"  (1.676 m), weight 86.2 kg, SpO2 98 %.  The patient is calm and cooperative at this time.  There have been no acute events since the last update.  Awaiting disposition plan from Social Work team(s).   Paulette Blanch, MD 09/12/18 [phone]

## 2018-09-12 NOTE — ED Notes (Signed)
BEHAVIORAL HEALTH ROUNDING Patient sleeping: Yes.   Patient alert and oriented: eyes closed  Appears asleep Behavior appropriate: Yes.  ; If no, describe:  Nutrition and fluids offered: Yes  Toileting and hygiene offered: sleeping Sitter present: q 15 minute observations and security camera monitoring  

## 2018-09-12 NOTE — ED Notes (Signed)
Hourly rounding reveals patient sleeping in room. No complaints, stable, in no acute distress. Q15 minute rounds and monitoring via [employer] to continue. 

## 2018-09-12 NOTE — ED Notes (Signed)
Pt. Up to use bathroom, pt. Returned to room with steady gait.

## 2018-09-12 NOTE — ED Notes (Signed)
Report to include Situation, Background, Assessment, and Recommendations received from Amy Teague RN. Patient alert and oriented, warm and dry, in no acute distress. Patient denies SI, HI, AVH and pain. Patient made aware of Q15 minute rounds and security cameras for their safety. Patient instructed to come to me with needs or concerns. 

## 2018-09-12 NOTE — ED Notes (Signed)
Hourly rounding reveals patient in room. No complaints, stable, in no acute distress. Q15 minute rounds and monitoring via Security Cameras to continue.Snack and beverage given. 

## 2018-09-12 NOTE — ED Notes (Signed)

## 2018-09-12 NOTE — ED Notes (Signed)
Hourly rounding reveals patient in room. No complaints, stable, in no acute distress. Q15 minute rounds and monitoring via Security Cameras to continue. 
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
## 2018-09-12 NOTE — ED Notes (Signed)
Hourly rounding reveals patient in rest room. No complaints, stable, in no acute distress. Q15 minute rounds and monitoring via Security Cameras to continue. 

## 2018-09-12 NOTE — ED Notes (Signed)
Social work placement 

## 2018-09-13 DIAGNOSIS — F209 Schizophrenia, unspecified: Secondary | ICD-10-CM | POA: Diagnosis not present

## 2018-09-13 MED ORDER — OLANZAPINE 10 MG PO TABS
10.0000 mg | ORAL_TABLET | Freq: Once | ORAL | Status: DC
  Filled 2018-09-13: qty 1

## 2018-09-13 MED ORDER — LORAZEPAM 2 MG PO TABS
2.0000 mg | ORAL_TABLET | Freq: Once | ORAL | Status: AC
  Administered 2018-09-13: 2 mg via ORAL
  Filled 2018-09-13: qty 1

## 2018-09-13 NOTE — ED Notes (Signed)
Hourly rounding reveals patient sleeping in room. No complaints, stable, in no acute distress. Q15 minute rounds and monitoring via [employer] to continue. 

## 2018-09-13 NOTE — ED Notes (Signed)
NP ordered Zyprexa for agitation. Pt stated he can't take that medication because it "messes with his heart."  NP made aware. Medication wasted.

## 2018-09-13 NOTE — ED Notes (Signed)
Pt wanting to get to [facility] to be with his family. "The hospital said they would pay for my bus ticket."  Pt agitated he is having to stay in the hospital. RN will ask NP for PRN medication.

## 2018-09-13 NOTE — ED Notes (Signed)
Pt given meal tray.

## 2018-09-13 NOTE — ED Notes (Signed)
Report to include Situation, Background, Assessment, and Recommendations received from Amy B. RN. Patient alert and oriented, warm and dry, in no acute distress. Patient denies SI, HI, VH and pain. Patient states he hears voices occasionally without command. Patient made aware of Q15 minute rounds and security cameras for their safety. Patient instructed to come to me with needs or concerns.

## 2018-09-13 NOTE — ED Notes (Signed)
Hourly rounding reveals patient in room. No complaints, stable, in no acute distress. Q15 minute rounds and monitoring via Security Cameras to continue. 

## 2018-09-13 NOTE — TOC Progression Note (Signed)
Transition of Care [facility]) - Progression Note    Patient Details  Name: Kenneth Jensen MRN: [id number] Date of Birth: Apr 12, 1990  Transition of Care [facility]) CM/SW Contact  Tania Josy Peaden, LCSW Phone Number: 09/13/2018, 10:09 AM  Clinical Narrative:     CSW contacted Clearence Ped [facility] ([phone]). The administrator is currently not in. CSW name and phone number was left for administrator to contact CSW tomorrow (9/8).   Expected Discharge Plan: Group Home Barriers to Discharge: Other (comment)(Waiting for group home offers)  Expected Discharge Plan and Services Expected Discharge Plan: Group Home                                               Social Determinants of Health (SDOH) Interventions    Readmission Risk Interventions No flowsheet data found.

## 2018-09-13 NOTE — ED Notes (Signed)
Pt given drink and sandwich tray. 

## 2018-09-13 NOTE — ED Notes (Signed)
Pt is taking a shower. No behavioral issues.

## 2018-09-13 NOTE — ED Provider Notes (Signed)
-----------------------------------------   9:01 AM on 09/13/2018 -----------------------------------------   Blood pressure 128/73, pulse 95, temperature 98.4 F (36.9 C), temperature source Oral, resp. rate 16, height 5\' 6"  (1.676 m), weight 86.2 kg, SpO2 100 %.  The patient is calm and cooperative at this time.  There have been no acute events since the last update.  Awaiting disposition plan from Behavioral Medicine and/or Social Work team(s).    Duffy Bruce, MD 09/13/18 [phone]

## 2018-09-13 NOTE — ED Notes (Signed)
Pt given 2 mg PO Ativan as ordered to help him calm down. Maintained on 15 minute checks and observation by security camera for safety.

## 2018-09-14 MED ORDER — LACTULOSE 10 GM/15ML PO SOLN: 20.0000 g | Freq: Every day | ORAL | 0 refills | Status: DC | PRN

## 2018-09-14 NOTE — ED Notes (Signed)
Hourly rounding reveals patient sleeping in room. No complaints, stable, in no acute distress. Q15 minute rounds and monitoring via [employer] to continue. 

## 2018-09-14 NOTE — ED Notes (Signed)
[facility] security to take pt to bus stop

## 2018-09-14 NOTE — ED Notes (Signed)
Pt wanting to leave at this time, pt is voluntary and has d./c paperwork as well as a bus pass. Pt given belongings back at this time and dressing.

## 2018-09-14 NOTE — ED Provider Notes (Signed)
-----------------------------------------   7:43 AM on 09/14/2018 -----------------------------------------   Blood pressure 128/75, pulse 94, temperature 98.4 F (36.9 C), temperature source Oral, resp. rate 16, height 5\' 6"  (1.676 m), weight 86.2 kg, SpO2 100 %.  The patient is calm and cooperative at this time.  There have been no acute events since the last update.  Awaiting disposition plan from Social Work team(s).   Lilia Pro., MD 09/14/18 [phone]

## 2018-09-14 NOTE — ED Notes (Signed)
Patient has been up to nursing station numerous times today asking for [place] to dial numbers to try to find him a ride. Patient was made aware that he is voluntary by Education officer, museum. Patient states he doesn't want to stay in hospital. Social worker has also been working with patient throughout the day (has been to see him twice in the [place]) to find him a place to stay. Patient states he's been treated like a second rate patient. Charge RN to speak to patient.

## 2018-09-14 NOTE — ED Notes (Signed)
Patient updated on status of Education officer, museum. Patient allowed to try to call for ride as requested by patient.

## 2018-09-14 NOTE — ED Notes (Signed)
Pt verbalizes understanding of d/c and follow up. This RN gave pt address to [employer] of [place] for shelter. PT verbalizes understanding

## 2018-09-14 NOTE — ED Notes (Signed)
Meal tray given 

## 2018-09-14 NOTE — ED Notes (Signed)
Social worker back over to talk to patient.

## 2018-09-14 NOTE — ED Notes (Signed)
Pt stating "I am ready to leave." Jennifer,RN made aware and went to talk to pt and MD. This tech went to get pt belongings and found one black bag. Sticker on black bag said 1 of 2 but this tech nor Animal nutritionist could locate the second bag. Anderson Malta, RN made aware of this.

## 2018-09-14 NOTE — Social Work (Addendum)
CSW discussed with Surveyor, quantity and EDP. Patient told RN yesterday that he would like to go to [place] to be closer to family.  CSW was informed that since patient is his own guardian, and does not lack capacity he is able to go.  CSW will discuss options with patient, and share homeless shelter information in [place] with him.   11:50am - Patient stating that he does not want to go to a shelter in [place] and would prefer to go to a motel in [place]. Patient shared that he needs to get in contact with his payee. Patient tried calling her, and did not receive a response.  1:45pm- Patient wanting to leave to get his things at his cousin's house. He continues to state that he does not want to go to a shelter.   EDP notified RN will provide patient with discharge papers, and bus pass.   CSW signing off.   [place], [place] ED  [phone]

## 2019-03-25 ENCOUNTER — Other Ambulatory Visit: Payer: Self-pay

## 2019-03-25 ENCOUNTER — Emergency Department
Admission: EM | Admit: 2019-03-25 | Discharge: 2019-03-25 | Disposition: A | Discharge status: Other Acute Inpatient Hospital | Payer: Medicare HMO | Attending: Emergency Medicine | Admitting: Emergency Medicine

## 2019-03-25 ENCOUNTER — Inpatient Hospital Stay
Admission: AD | Admit: 2019-03-25 | Discharge: 2019-03-28 | DRG: 885 | Disposition: A | Discharge status: Home / Self Care | Payer: Medicare HMO | Source: Intra-hospital | Attending: Psychiatry | Admitting: Psychiatry

## 2019-03-25 ENCOUNTER — Encounter: Payer: Self-pay | Admitting: Behavioral Health

## 2019-03-25 DIAGNOSIS — Z885 Allergy status to narcotic agent status: Secondary | ICD-10-CM | POA: Diagnosis not present

## 2019-03-25 DIAGNOSIS — F17210 Nicotine dependence, cigarettes, uncomplicated: Secondary | ICD-10-CM | POA: Diagnosis present

## 2019-03-25 DIAGNOSIS — Z79899 Other long term (current) drug therapy: Secondary | ICD-10-CM | POA: Insufficient documentation

## 2019-03-25 DIAGNOSIS — F1290 Cannabis use, unspecified, uncomplicated: Secondary | ICD-10-CM | POA: Diagnosis present

## 2019-03-25 DIAGNOSIS — Z20822 Contact with and (suspected) exposure to covid-19: Secondary | ICD-10-CM | POA: Diagnosis not present

## 2019-03-25 DIAGNOSIS — F329 Major depressive disorder, single episode, unspecified: Secondary | ICD-10-CM | POA: Diagnosis not present

## 2019-03-25 DIAGNOSIS — F17200 Nicotine dependence, unspecified, uncomplicated: Secondary | ICD-10-CM | POA: Diagnosis not present

## 2019-03-25 DIAGNOSIS — F200 Paranoid schizophrenia: Secondary | ICD-10-CM | POA: Diagnosis present

## 2019-03-25 DIAGNOSIS — F250 Schizoaffective disorder, bipolar type: Principal | ICD-10-CM | POA: Diagnosis present

## 2019-03-25 DIAGNOSIS — F319 Bipolar disorder, unspecified: Secondary | ICD-10-CM | POA: Insufficient documentation

## 2019-03-25 DIAGNOSIS — Z03818 Encounter for observation for suspected exposure to other biological agents ruled out: Secondary | ICD-10-CM | POA: Diagnosis not present

## 2019-03-25 DIAGNOSIS — F209 Schizophrenia, unspecified: Secondary | ICD-10-CM | POA: Diagnosis not present

## 2019-03-25 DIAGNOSIS — F32A Depression, unspecified: Secondary | ICD-10-CM | POA: Diagnosis present

## 2019-03-25 DIAGNOSIS — Z1589 Genetic susceptibility to other disease: Secondary | ICD-10-CM

## 2019-03-25 HISTORY — DX: Schizophrenia, unspecified: F20.9

## 2019-03-25 LAB — COMPREHENSIVE METABOLIC PANEL
ALT: 16 U/L (ref 0–44)
AST: 22 U/L (ref 15–41)
Albumin: 4.1 g/dL (ref 3.5–5.0)
Alkaline Phosphatase: 55 U/L (ref 38–126)
Anion gap: 10 (ref 5–15)
BUN: 11 mg/dL (ref 6–20)
CO2: 26 mmol/L (ref 22–32)
Calcium: 8.7 mg/dL — ABNORMAL LOW (ref 8.9–10.3)
Chloride: 102 mmol/L (ref 98–111)
Creatinine, Ser: 0.82 mg/dL (ref 0.61–1.24)
GFR calc Af Amer: >60 mL/min (ref >60)
GFR calc non Af Amer: >60 mL/min (ref >60)
Glucose, Bld: 98 mg/dL (ref 70–99)
Potassium: 3.7 mmol/L (ref 3.5–5.1)
Sodium: 138 mmol/L (ref 135–145)
Total Bilirubin: 0.6 mg/dL (ref 0.3–1.2)
Total Protein: 7.4 g/dL (ref 6.5–8.1)

## 2019-03-25 LAB — CBC
HCT: 40.5 % (ref 39.0–52.0)
Hemoglobin: 13.8 g/dL (ref 13.0–17.0)
MCH: 31.4 pg (ref 26.0–34.0)
MCHC: 34.1 g/dL (ref 30.0–36.0)
MCV: 92 fL (ref 80.0–100.0)
Platelets: 362 10*3/uL (ref 150–400)
RBC: 4.4 MIL/uL (ref 4.22–5.81)
RDW: 13.4 % (ref 11.5–15.5)
WBC: 10.7 10*3/uL — ABNORMAL HIGH (ref 4.0–10.5)
nRBC: 0 % (ref 0.0–0.2)

## 2019-03-25 LAB — RESPIRATORY PANEL BY RT PCR (FLU A&B, COVID)
Influenza A by PCR: NEGATIVE
Influenza B by PCR: NEGATIVE
SARS Coronavirus 2 by RT PCR: NEGATIVE

## 2019-03-25 LAB — ETHANOL: Alcohol, Ethyl (B): <10 mg/dL (ref <10)

## 2019-03-25 LAB — ACETAMINOPHEN LEVEL: Acetaminophen (Tylenol), Serum: <10 ug/mL — ABNORMAL LOW (ref 10–30)

## 2019-03-25 LAB — VALPROIC ACID LEVEL: Valproic Acid Lvl: 112 ug/mL — ABNORMAL HIGH (ref 50.0–100.0)

## 2019-03-25 LAB — SALICYLATE LEVEL: Salicylate Lvl: <7 mg/dL — ABNORMAL LOW (ref 7.0–30.0)

## 2019-03-25 MED ORDER — DIVALPROEX SODIUM ER 500 MG PO TB24
500.0000 mg | ORAL_TABLET | Freq: Three times a day (TID) | ORAL | Status: DC
  Administered 2019-03-25 – 2019-03-28 (×9): 500 mg via ORAL
  Filled 2019-03-25 (×9): qty 1

## 2019-03-25 MED ORDER — WHITE PETROLATUM EX OINT
TOPICAL_OINTMENT | CUTANEOUS | Status: DC | PRN
  Filled 2019-03-25 (×3): qty 5

## 2019-03-25 MED ORDER — ONDANSETRON HCL 4 MG/2ML IJ SOLN
INTRAMUSCULAR | Status: AC
  Filled 2019-03-25: qty 2

## 2019-03-25 MED ORDER — SEVOFLURANE IN SOLN
RESPIRATORY_TRACT | Status: AC
  Filled 2019-03-25: qty 250

## 2019-03-25 MED ORDER — FENTANYL CITRATE (PF) 100 MCG/2ML IJ SOLN
INTRAMUSCULAR | Status: AC
  Filled 2019-03-25: qty 2

## 2019-03-25 MED ORDER — TRIPLE ANTIBIOTIC 3.5-400-5000 EX OINT
TOPICAL_OINTMENT | Freq: Two times a day (BID) | CUTANEOUS | Status: AC
  Administered 2019-03-25 – 2019-03-28 (×6): 1 via TOPICAL
  Filled 2019-03-25 (×7): qty 1

## 2019-03-25 MED ORDER — ROCURONIUM BROMIDE 10 MG/ML (PF) SYRINGE
PREFILLED_SYRINGE | INTRAVENOUS | Status: AC
  Filled 2019-03-25: qty 10

## 2019-03-25 MED ORDER — WHITE PETROLATUM EX OINT
TOPICAL_OINTMENT | CUTANEOUS | Status: AC
  Administered 2019-03-25: 1 via CUTANEOUS
  Filled 2019-03-25: qty 5

## 2019-03-25 MED ORDER — SUCCINYLCHOLINE CHLORIDE 200 MG/10ML IV SOSY
PREFILLED_SYRINGE | INTRAVENOUS | Status: AC
  Filled 2019-03-25: qty 10

## 2019-03-25 MED ORDER — PROPOFOL 10 MG/ML IV BOLUS
INTRAVENOUS | Status: AC
  Filled 2019-03-25: qty 20

## 2019-03-25 MED ORDER — ALUM & MAG HYDROXIDE-SIMETH 200-200-20 MG/5ML PO SUSP: 30.0000 mL | ORAL | Status: DC | PRN

## 2019-03-25 MED ORDER — QUETIAPINE FUMARATE 200 MG PO TABS
200.0000 mg | ORAL_TABLET | Freq: Every day | ORAL | Status: DC
  Administered 2019-03-25 – 2019-03-27 (×3): 200 mg via ORAL
  Filled 2019-03-25 (×3): qty 1

## 2019-03-25 MED ORDER — GLYCOPYRROLATE 0.2 MG/ML IJ SOLN
INTRAMUSCULAR | Status: AC
  Filled 2019-03-25: qty 1

## 2019-03-25 MED ORDER — TRAZODONE HCL 50 MG PO TABS
50.0000 mg | ORAL_TABLET | Freq: Every day | ORAL | Status: DC
  Administered 2019-03-25 – 2019-03-27 (×3): 50 mg via ORAL
  Filled 2019-03-25 (×4): qty 1

## 2019-03-25 MED ORDER — DEXAMETHASONE SODIUM PHOSPHATE 10 MG/ML IJ SOLN
INTRAMUSCULAR | Status: AC
  Filled 2019-03-25: qty 1

## 2019-03-25 MED ORDER — PHENYLEPHRINE HCL (PRESSORS) 10 MG/ML IV SOLN
INTRAVENOUS | Status: AC
  Filled 2019-03-25: qty 1

## 2019-03-25 MED ORDER — MIDAZOLAM HCL 2 MG/2ML IJ SOLN
INTRAMUSCULAR | Status: AC
  Filled 2019-03-25: qty 2

## 2019-03-25 MED ORDER — MAGNESIUM HYDROXIDE 400 MG/5ML PO SUSP: 30.0000 mL | Freq: Every day | ORAL | Status: DC | PRN

## 2019-03-25 MED ORDER — WHITE PETROLATUM EX OINT
TOPICAL_OINTMENT | CUTANEOUS | Status: DC | PRN
  Filled 2019-03-25: qty 5
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
## 2019-03-25 NOTE — ED Notes (Signed)
VOL/ Moved to [facility]-5/Waiting to go to BMU

## 2019-03-25 NOTE — BHH Suicide Risk Assessment (Signed)
[facility] Admission Suicide Risk Assessment   Nursing information obtained from:  Patient Demographic factors:  Male, Unemployed Current Mental Status:  NA Loss Factors:  NA Historical Factors:  Impulsivity Risk Reduction Factors:  Living with another person, especially a relative  Total Time spent with patient: 1 hour Principal Problem: Schizoaffective disorder, bipolar type (HCC) Diagnosis:  Principal Problem:   Schizoaffective disorder, bipolar type (HCC)  Subjective Data: Patient seen chart reviewed.  Patient known from previous encounters.  29 year old man with schizophrenia.  Patient has no current complaints.  Admits to occasional hallucinations.  Denies however any suicidal or homicidal ideation.  Denies any feelings of agitation.  Cooperative insightful and agreeable to treatment.  Continued Clinical Symptoms:  Alcohol Use Disorder Identification Test Final Score (AUDIT): 2 The "Alcohol Use Disorders Identification Test", Guidelines for Use in Primary Care, Second Edition.  World Science writer [facility]). Score between 0-7:  no or low risk or alcohol related problems. Score between 8-15:  moderate risk of alcohol related problems. Score between 16-19:  high risk of alcohol related problems. Score 20 or above:  warrants further diagnostic evaluation for alcohol dependence and treatment.   CLINICAL FACTORS:   Schizophrenia:   Paranoid or undifferentiated type   Musculoskeletal: Strength & Muscle Tone: within normal limits Gait & Station: normal Patient leans: N/A  Psychiatric Specialty Exam: Physical Exam  Nursing note and vitals reviewed. Constitutional: He appears well-developed and well-nourished.  HENT:  Head: Normocephalic and atraumatic.  Eyes: Pupils are equal, round, and reactive to light. Conjunctivae are normal.  Cardiovascular: Normal heart sounds.  Respiratory: Effort normal.  GI: Soft.  Musculoskeletal:        General: Normal range of motion.     Cervical  back: Normal range of motion.  Neurological: He is alert.  Skin: Skin is warm and dry.  Psychiatric: His speech is normal and behavior is normal. Judgment and thought content normal. His affect is blunt. Cognition and memory are normal.    Review of Systems  Constitutional: Negative.   HENT: Negative.   Eyes: Negative.   Respiratory: Negative.   Cardiovascular: Negative.   Gastrointestinal: Negative.   Musculoskeletal: Negative.   Skin: Negative.   Neurological: Negative.   Psychiatric/Behavioral: Positive for hallucinations. Negative for dysphoric mood and suicidal ideas.    Blood pressure 138/84, pulse 92, temperature 98.3 F (36.8 C), temperature source Oral, resp. rate 18, height 5\' 7"  (1.702 m), weight 101.6 kg, SpO2 100 %.Body mass index is 35.08 kg/m.  General Appearance: Casual  Eye Contact:  Good  Speech:  Clear and Coherent  Volume:  Normal  Mood:  Euthymic  Affect:  Congruent  Thought Process:  Coherent  Orientation:  Full (Time, Place, and Person)  Thought Content:  Logical  Suicidal Thoughts:  No  Homicidal Thoughts:  No  Memory:  Immediate;   Fair Recent;   Fair Remote;   Fair  Judgement:  Fair  Insight:  Fair  Psychomotor Activity:  Normal  Concentration:  Concentration: Fair  Recall:  of Knowledge:  Fair  Language:  Fair  Akathisia:  No  Handed:  Right  AIMS (if indicated):     Assets:  Desire for Improvement Housing Physical Health  ADL's:  Intact  Cognition:  WNL  Sleep:         COGNITIVE FEATURES THAT CONTRIBUTE TO RISK:  None    SUICIDE RISK:   Minimal: No identifiable suicidal ideation.  Patients presenting with no risk factors but with  morbid ruminations; may be classified as minimal risk based on the severity of the depressive symptoms  PLAN OF CARE: Continue 15-minute checks.  Continue usual medicine for psychiatric treatment.  Engage in individual and group therapy.  Reassess dangerousness prior to discharge  I certify  that inpatient services furnished can reasonably be expected to improve the patient's condition.   Alethia Berthold, MD 03/25/2019, 4:47 PM

## 2019-03-25 NOTE — Progress Notes (Signed)
Patient admitted from ED.Patient states " I am tired of this.I like to have a good life." Patient is cooperative during admission assessment. Patient denies SI/HI at this time. Patient denies AVH. Patient informed of fall risk status, fall risk assessed "low" at this time. Patient oriented to unit/staff/room. Patient denies any questions/concerns at this time. Patient safe on unit with Q15 minute checks for safety.Skin assessment and body search done.no contraband found.
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
## 2019-03-25 NOTE — ED Notes (Signed)
Patient breakfast tray placed in his room, patient appears to be asleep

## 2019-03-25 NOTE — BH Assessment (Signed)
PATIENT BED AVAILABLE AFTER 9:30AM PENDING NEGATIVE COVID RESULTS  Patient is to be admitted to [facility] by Psychiatric Nurse Practitioner Gillermo Murdoch.  Attending Physician will be Dr. Toni Amend.   Patient has been assigned to room 319, by [facility] Charge Nurse [place].   Intake Paper Work has been signed and placed on patient chart.  ER staff is aware of the admission:  [facility] ER Secretary    Dr. Don Perking , ER MD   [facility] Patient's Nurse   [facility] Patient Access.
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
## 2019-03-25 NOTE — ED Provider Notes (Signed)
[facility] Emergency Department Provider Note  ____________________________________________  Time seen: Approximately 1:23 AM  I have reviewed the triage vital signs and the nursing notes.   HISTORY  Chief Complaint Psychiatric Evaluation   HPI Kenneth Jensen is a 29 y.o. male with a history of schizophrenia and bipolar disorder who was brought in by his cousin  for psychiatric evaluation.  Patient reports that he has been living with his cousin since moving out of his grandmother's house.  Patient reports "my cousin is a married lesbian and she has sex parties with other girls. I know some boys like that kind of stuff but I don't and then we fight." Patient endorses compliance with his psych meds however reports that he ran out of them after taking his last dose tonight.  He smokes marijuana but denies any other drug or alcohol use.  He denies suicidal homicidal ideation.  He is complaining of a skin abrasion that is located on his left forearm at the site of his tattoo.  He thinks that the dog was picking at it when he was asleep.  Past Medical History:  Diagnosis Date  . Depression   . Schizophrenia ([place])     There are no problems to display for this patient.   History reviewed. No pertinent surgical history.  Prior to Admission medications   Medication Sig Start Date End Date Taking? Authorizing Provider  divalproex (DEPAKOTE ER) 500 MG 24 hr tablet Take 500 mg by mouth 3 (three) times daily. 03/24/19   [provider]  QUEtiapine (SEROQUEL) 200 MG tablet Take 200 mg by mouth at bedtime. 03/14/19   [provider]  traZODone (DESYREL) 50 MG tablet Take 50 mg by mouth at bedtime. 03/14/19   [provider]    Allergies Patient has no allergy information on record.  No family history on file.  Social History Social History   Tobacco Use  . Smoking status: Current Every Day Smoker  . Smokeless tobacco: Never Used    Substance Use Topics  . Alcohol use: Yes    Comment: social  . Drug use: Yes    Types: Marijuana    Review of Systems  Constitutional: Negative for fever. Eyes: Negative for visual changes. ENT: Negative for sore throat. Neck: No neck pain  Cardiovascular: Negative for chest pain. Respiratory: Negative for shortness of breath. Gastrointestinal: Negative for abdominal pain, vomiting or diarrhea. Genitourinary: Negative for dysuria. Musculoskeletal: Negative for back pain. Skin: Negative for rash. Neurological: Negative for headaches, weakness or numbness. Psych: No SI or HI  ____________________________________________   PHYSICAL EXAM:  VITAL SIGNS: ED Triage Vitals  Enc Vitals Group     BP 03/25/19 0033 130/63     Pulse Rate 03/25/19 0033 99     Resp 03/25/19 0033 20     Temp 03/25/19 0033 97.8 F (36.6 C)     Temp Source 03/25/19 0033 Oral     SpO2 03/25/19 0033 100 %     Weight 03/25/19 0034 200 lb (90.7 kg)     Height 03/25/19 0034 5\' 9"  (1.753 m)     Head Circumference --      Peak Flow --      Pain Score 03/25/19 0034 10     Pain Loc --      Pain Edu? --      Excl. in [place]? --     Constitutional: Alert and oriented. Well appearing and in no apparent distress. HEENT:  Head: Normocephalic and atraumatic.         Eyes: Conjunctivae are normal. Sclera is non-icteric.       Mouth/Throat: Mucous membranes are moist.       Neck: Supple with no signs of meningismus. Cardiovascular: Regular rate and rhythm.  Respiratory: Normal respiratory effort.  Gastrointestinal: Soft, non tender, and non distended. Musculoskeletal: No edema, cyanosis, or erythema of extremities. Neurologic: Normal speech and language. Face is symmetric. Moving all extremities. No gross focal neurologic deficits are appreciated. Skin: Skin is warm, dry and intact. No rash noted. There is a small area over the tattoo on the R forearm where the skin was picked off.  Psychiatric: Mood and  affect are normal. Speech and behavior are normal.  ____________________________________________   LABS (all labs ordered are listed, but only abnormal results are displayed)  Labs Reviewed  CBC - Abnormal; Notable for the following components:      Result Value   WBC 10.7 (*)    All other components within normal limits  COMPREHENSIVE METABOLIC PANEL - Abnormal; Notable for the following components:   Calcium 8.7 (*)    All other components within normal limits  SALICYLATE LEVEL - Abnormal; Notable for the following components:   Salicylate Lvl <7.0 (*)    All other components within normal limits  ACETAMINOPHEN LEVEL - Abnormal; Notable for the following components:   Acetaminophen (Tylenol), Serum <10 (*)    All other components within normal limits  RESPIRATORY PANEL BY RT PCR (FLU A&B, COVID)  ETHANOL  URINE DRUG SCREEN, QUALITATIVE ([facility] ONLY)   ____________________________________________  EKG  none  ____________________________________________  RADIOLOGY  none  ____________________________________________   PROCEDURES  Procedure(s) performed: None Procedures Critical Care performed:  None ____________________________________________   INITIAL IMPRESSION / ASSESSMENT AND PLAN / ED COURSE  29 y.o. male with a history of schizophrenia and bipolar disorder who was brought in by his cousin  for psychiatric evaluation.  Patient is calm and cooperative, currently not a danger to himself or others, denies SI or HI and therefore does not meet criteria for IVC.  We will do labs for medical clearance and consult psychiatry.  Patient will need placement as he is currently living with his cousin and this arrangement does not seem to be working.    _________________________ 2:13 AM on 03/25/2019 -----------------------------------------  Medically cleared. The patient has been placed in psychiatric observation due to the need to provide a safe environment for the  patient while obtaining psychiatric consultation and evaluation, as well as ongoing medical and medication management to treat the patient's condition.  The patient has not been placed under full IVC at this time.    Please note:  Patient was evaluated in Emergency Department today for the symptoms described in the history of present illness. Patient was evaluated in the context of the global COVID-19 pandemic, which necessitated consideration that the patient might be at risk for infection with the SARS-CoV-2 virus that causes COVID-19. Institutional protocols and algorithms that pertain to the evaluation of patients at risk for COVID-19 are in a state of rapid change based on information released by regulatory bodies including the CDC and federal and state organizations. These policies and algorithms were followed during the patient's care in the ED.  Some ED evaluations and interventions may be delayed as a result of limited staffing during the pandemic.   As part of my medical decision making, I reviewed the following data within the electronic MEDICAL RECORD NUMBER Nursing  notes reviewed and incorporated, Labs reviewed , Old chart reviewed, A consult was requested and obtained from this/these consultant(s) psychiatry, Notes from prior ED visits and [place] Controlled Substance Database   ____________________________________________   FINAL CLINICAL IMPRESSION(S) / ED DIAGNOSES   Final diagnoses:  Schizophrenia, unspecified type (HCC)      NEW MEDICATIONS STARTED DURING THIS VISIT:  ED Discharge Orders    None       Note:  This document was prepared using Dragon voice recognition software and may include unintentional dictation errors.    Don Perking, [place], MD 03/25/19 [phone]

## 2019-03-25 NOTE — Tx Team (Signed)
Initial Treatment Plan 03/25/2019 4:06 PM Kenneth Jensen MMN:[id number]    PATIENT STRESSORS: Marital or family conflict Medication change or noncompliance Substance abuse   PATIENT STRENGTHS: Communication skills Motivation for treatment/growth Physical Health   PATIENT IDENTIFIED PROBLEMS: Anxiety  " Tired of living."                   DISCHARGE CRITERIA:  Medical problems require only outpatient monitoring Motivation to continue treatment in a less acute level of care Verbal commitment to aftercare and medication compliance  PRELIMINARY DISCHARGE PLAN: Attend aftercare/continuing care group Return to previous living arrangement  PATIENT/FAMILY INVOLVEMENT: This treatment plan has been presented to and reviewed with the patient, Kenneth Jensen, and/or family member,.  The patient and family have been given the opportunity to ask questions and make suggestions.  Leonarda Salon, RN 03/25/2019, 4:06 PM

## 2019-03-26 LAB — VALPROIC ACID LEVEL: Valproic Acid Lvl: 89 ug/mL (ref 50.0–100.0)

## 2019-03-26 MED ORDER — TRAZODONE HCL 50 MG PO TABS
50.0000 mg | ORAL_TABLET | Freq: Every evening | ORAL | Status: DC | PRN
  Administered 2019-03-27: 50 mg via ORAL
  Filled 2019-03-26: qty 1

## 2019-03-26 NOTE — Progress Notes (Signed)
Patient alert and oriented x 4, affect is blunted, thoughts are disorganized and sometimes incoherent, he appears anxious and intrusive , he was needy at the nursing station with multiple request been made to staff. Patient currently denies SI/HI/AVH but he was noted responding to internal stimuli, interacting inappropriately in the milieu  Patient during interaction appears childlike, he aughs inappropriately and keeps ruminating on same topic over and over. Patient is complaint with medication regimen, 15 minutes safety checks maintained will continue to monitor.

## 2019-03-26 NOTE — Plan of Care (Signed)
D: Pt. During assessments denies si/hi/avh, verbalizes he is able to remain safe on the unit. Pt. Reports tolerating his medicines for the most part, but reports some sedation from his medicines, that patient verbalizes, "makes me want to sleep all the time". Pt. Denies physical pain. Pt. Engagement is superficial overall. Pt. Endorses his mood as, "alright". Pt. Is disheveled and flat in his affect.   A: Q x 15 minute observation checks in place/maintained for safety. Patient is provided with education throughout shift when appropriate and able.  Patient is given/offered medications per orders. Patient is encouraged to attend groups, participate in unit activities and continue with plan of care. Pt. Chart and plans of care reviewed. Pt. Given support and encouragement when appropriate and able.    R: Patient is complaint with medication and unit procedures thus far. Pt. Observed eating good. Pt. Went to outside courtyard group, but otherwise has been mostly isolative and withdrawn to room resting in bed. Of note, patient several times today observed randomly smiling widely to himself, as if he was responding to internal stimuli. Pt. Continues to deny this though to this Clinical research associate.      Problem: Activity: Goal: Interest or engagement in activities will improve Outcome: Progressing Goal: Sleeping patterns will improve Outcome: Progressing Problem: Education: Goal: Knowledge of [place] [facility] Education information/materials will improve Outcome: Progressing Goal: Emotional status will improve Outcome: Progressing Goal: Mental status will improve Outcome: Progressing Goal: Verbalization of understanding the information provided will improve Outcome: Progressing Problem: Health Behavior/Discharge Planning: Goal: Identification of resources available to assist in meeting health care needs will improve Outcome: Progressing Goal: Compliance with treatment plan for underlying cause of condition  will improve Outcome: Progressing problem: Activity: Goal: Will identify at least one activity in which they can participate Outcome: Progressing Problem: Coping: Goal: Ability to identify and develop effective coping behavior will improve Outcome: Progressing Goal: Ability to interact with others will improve Outcome: Progressing Goal: Demonstration of participation in decision-making regarding own care will improve Outcome: Progressing Goal: Ability to use eye contact when communicating with others will improve Outcome: Progressing Problem: Self-Concept: Goal: Will verbalize positive feelings about self Outcome: Progressing

## 2019-03-26 NOTE — Progress Notes (Signed)
D: Patient has been calm and cooperative. Appears slightly guarded and internally preoccupied. When asked if he was hearing voices, he said "not right now" and indicated that he had heard them earlier. Denies SI and HI. Interacting a little with peers in the dayroom. Med compliant. A: Continue to monitor for safety R: safety maintained.
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
## 2019-03-26 NOTE — Tx Team (Signed)
Interdisciplinary Treatment and Diagnostic Plan Update  03/26/2019 Time of Session: 9:00am Kenneth Jensen MRN: [id number]  Principal Diagnosis: Schizoaffective disorder, bipolar type [facility])  Secondary Diagnoses: Principal Problem:   Schizoaffective disorder, bipolar type ([place])   Current Medications:  Current Facility-Administered Medications  Medication Dose Route Frequency Provider Last Rate Last Admin  . alum & mag hydroxide-simeth (MAALOX/MYLANTA) 200-200-20 MG/5ML suspension 30 mL  30 mL Oral Q4H PRN Caroline Sauger, NP      . divalproex (DEPAKOTE ER) 24 hr tablet 500 mg  500 mg Oral TID Caroline Sauger, NP   500 mg at 03/26/19 0818  . magnesium hydroxide (MILK OF MAGNESIA) suspension 30 mL  30 mL Oral Daily PRN Caroline Sauger, NP      . QUEtiapine (SEROQUEL) tablet 200 mg  200 mg Oral QHS Caroline Sauger, NP   200 mg at 03/25/19 2144  . traZODone (DESYREL) tablet 50 mg  50 mg Oral QHS Caroline Sauger, NP   50 mg at 03/25/19 2144  . Triple Antibiotic [phone] OINT   Topical BID Clapacs, Madie Reno, MD   1 application at 17/51/02 0818  . white petrolatum (VASELINE) gel   Topical PRN Caroline Sauger, NP       PTA Medications: Medications Prior to Admission  Medication Sig Dispense Refill Last Dose  . divalproex (DEPAKOTE ER) 500 MG 24 hr tablet Take 500 mg by mouth 3 (three) times daily.     Lorayne Bender SUSTENNA 234 MG/1.5ML SUSY injection Inject 234 mg into the muscle every 30 (thirty) days.     Marland Kitchen QUEtiapine (SEROQUEL) 200 MG tablet Take 200 mg by mouth at bedtime.     . traZODone (DESYREL) 50 MG tablet Take 50 mg by mouth at bedtime.       Patient Stressors: Marital or family conflict Medication change or noncompliance Substance abuse  Patient Strengths: Agricultural engineer for treatment/growth Physical Health  Treatment Modalities: Medication Management, Group therapy, Case management,  1 to 1 session with clinician,  Psychoeducation, Recreational therapy.   Physician Treatment Plan for Primary Diagnosis: Schizoaffective disorder, bipolar type ([place]) Long Term Goal(s): Improvement in symptoms so as ready for discharge Improvement in symptoms so as ready for discharge   Short Term Goals: Ability to demonstrate self-control will improve Compliance with prescribed medications will improve  Medication Management: Evaluate patient's response, side effects, and tolerance of medication regimen.  Therapeutic Interventions: 1 to 1 sessions, Unit Group sessions and Medication administration.  Evaluation of Outcomes: Progressing  Physician Treatment Plan for Secondary Diagnosis: Principal Problem:   Schizoaffective disorder, bipolar type ([place])  Long Term Goal(s): Improvement in symptoms so as ready for discharge Improvement in symptoms so as ready for discharge   Short Term Goals: Ability to demonstrate self-control will improve Compliance with prescribed medications will improve     Medication Management: Evaluate patient's response, side effects, and tolerance of medication regimen.  Therapeutic Interventions: 1 to 1 sessions, Unit Group sessions and Medication administration.  Evaluation of Outcomes: Progressing   RN Treatment Plan for Primary Diagnosis: Schizoaffective disorder, bipolar type ([place]) Long Term Goal(s): Knowledge of disease and therapeutic regimen to maintain health will improve  Short Term Goals: Ability to remain free from injury will improve, Ability to verbalize frustration and anger appropriately will improve, Ability to demonstrate self-control, Ability to participate in decision making will improve, Ability to verbalize feelings will improve, Ability to disclose and discuss suicidal ideas, Ability to identify and develop effective coping behaviors will improve and Compliance with  prescribed medications will improve  Medication Management: RN will administer medications as ordered  by provider, will assess and evaluate patient's response and provide education to patient for prescribed medication. RN will report any adverse and/or side effects to prescribing provider.  Therapeutic Interventions: 1 on 1 counseling sessions, Psychoeducation, Medication administration, Evaluate responses to treatment, Monitor vital signs and CBGs as ordered, Perform/monitor CIWA, COWS, AIMS and Fall Risk screenings as ordered, Perform wound care treatments as ordered.  Evaluation of Outcomes: Progressing   LCSW Treatment Plan for Primary Diagnosis: Schizoaffective disorder, bipolar type (HCC) Long Term Goal(s): Safe transition to appropriate next level of care at discharge, Engage patient in therapeutic group addressing interpersonal concerns.  Short Term Goals: Engage patient in aftercare planning with referrals and resources, Increase social support, Increase ability to appropriately verbalize feelings, Increase emotional regulation, Facilitate acceptance of mental health diagnosis and concerns, Facilitate patient progression through stages of change regarding substance use diagnoses and concerns, Identify triggers associated with mental health/substance abuse issues and Increase skills for wellness and recovery  Therapeutic Interventions: Assess for all discharge needs, 1 to 1 time with Social worker, Explore available resources and support systems, Assess for adequacy in community support network, Educate family and significant other(s) on suicide prevention, Complete Psychosocial Assessment, Interpersonal group therapy.  Evaluation of Outcomes: Progressing   Progress in Treatment: Attending groups: No. Admitted on 03/25/19 Participating in groups: No. 03/25/19 Taking medication as prescribed: Yes. Toleration medication: Yes. Family/Significant other contact made: No, will contact:  Pt will identify a contact  Patient understands diagnosis: Yes. Discussing patient identified  problems/goals with staff: Yes. Medical problems stabilized or resolved: Yes. Denies suicidal/homicidal ideation: Yes. Issues/concerns per patient self-inventory: No. Other: None  New problem(s) identified: No, Describe:  None  New Short Term/Long Term Goal(s):  Patient Goals:  "I want to learn how to communicate"  Discharge Plan or Barriers: Patient recently admitted. CSW will continue to follow and assess for appropriate referrals and possible discharge planning.  Reason for Continuation of Hospitalization: Hallucinations Medication stabilization  Estimated Length of Stay: 3-5 days  Attendees: Patient: Kenneth Jensen 03/26/2019 10:15 AM  Physician: Dr. Jola Babinski 03/26/2019 10:15 AM  Nursing:  03/26/2019 10:15 AM  RN Care Manager: 03/26/2019 10:15 AM  Social Worker: Teresita Madura, [place] 03/26/2019 10:15 AM  Recreational Therapist:  03/26/2019 10:15 AM  Other:  03/26/2019 10:15 AM  Other:  03/26/2019 10:15 AM  Other: 03/26/2019 10:15 AM    Scribe for Treatment Team: Jimmey Ralph, LCSWA 03/26/2019 10:15 AM

## 2019-03-26 NOTE — [facility] Group Notes (Signed)
[facility] Group Notes:  (Nursing/MHT/Case Management/Adjunct)  Date:  03/26/2019  Time:  9:43 AM  Type of Therapy:  Community Meeting   Participation Level:  Did Not Attend  Summary of Progress/Problems:  Kenneth Jensen 03/26/2019, 9:43 AM

## 2019-03-26 NOTE — [facility] Group Notes (Signed)
[facility] Group Notes:  (Nursing/MHT/Case Management/Adjunct)  Date:  03/26/2019  Time:  8:59 PM  Type of Therapy:  Group Therapy  Participation Level:  Active  Participation Quality:  Appropriate  Affect:  Appropriate  Cognitive:  Alert  Insight:  Good  Engagement in Group:  Engaged  Modes of Intervention:  Support  Summary of Progress/Problems:  Kenneth Jensen 03/26/2019, 8:59 PM

## 2019-03-26 NOTE — Plan of Care (Signed)
  Problem: Education: Goal: Knowledge of [place] [facility] Education information/materials will improve Outcome: Progressing Goal: Emotional status will improve Outcome: Progressing Goal: Mental status will improve Outcome: Progressing Goal: Verbalization of understanding the information provided will improve Outcome: Progressing  D: Patient has been calm and cooperative. Appears slightly guarded and internally preoccupied. When asked if he was hearing voices, he said "not right now" and indicated that he had heard them earlier. Denies SI and HI. Interacting a little with peers in the dayroom. Med compliant. A: Continue to monitor for safety R: safety maintained.

## 2019-03-26 NOTE — [facility] Group Notes (Signed)
LCSW Group Therapy Note  03/26/2019 1:00pm  Type of Therapy and Topic:  Group Therapy:  Cognitive Distortions  Participation Level:  Did Not Attend   Description of Group:    Patients in this group will be introduced to the topic of cognitive distortions.  Patients will identify and describe cognitive distortions, describe the feelings these distortions create for them.  Patients will identify one or more situations in their personal life where they have cognitively distorted thinking and will verbalize challenging this cognitive distortion through positive thinking skills.  Patients will practice the skill of using positive affirmations to challenge cognitive distortions using affirmation cards.    Therapeutic Goals:  1. Patient will identify two or more cognitive distortions they have used 2. Patient will identify one or more emotions that stem from use of a cognitive distortion 3. Patient will demonstrate use of a positive affirmation to counter a cognitive distortion through discussion and/or role play. 4. Patient will describe one way cognitive distortions can be detrimental to wellness   Summary of Patient Progress:  X  Therapeutic Modalities:   Cognitive Behavioral Therapy Motivational Interviewing   Teresita Madura, MSW, [facility] Clinical Social Worker  03/26/2019 2:12 PM

## 2019-03-27 MED ORDER — NICOTINE 21 MG/24HR TD PT24
21.0000 mg | MEDICATED_PATCH | Freq: Every day | TRANSDERMAL | Status: DC
  Administered 2019-03-27: 21 mg via TRANSDERMAL
  Filled 2019-03-27: qty 1

## 2019-03-27 NOTE — Progress Notes (Signed)
D: Patient asked if he was hearing voices and he responded "not now". He has been pleasant and cooperative. Socializing with peers in the day room. Expressed concern at being able to get to sleep and said he did not feel safe. Encouraged patient and instructed him that he would be checked on all through the night and that he would be safe. Received Nicotine patch per order for complaint of craving. Denies SI, HI and current AVH. Contracts for safety A; Continue to monitor for safety R: Safety maintained.

## 2019-03-27 NOTE — [facility] Group Notes (Signed)
[facility] Group Notes:  (Nursing/MHT/Case Management/Adjunct)  Date:  03/27/2019  Time:  1:00 PM  Type of Therapy:  Psychoeducational Skills  Participation Level:  Did Not Attend  Participation Quality:    Affect:   Cognitive:    Insight:    Engagement in Group:    Modes of Intervention:    Summary of Progress/Problems:  Kenneth Jensen 03/27/2019, 5:36 PM

## 2019-03-27 NOTE — Progress Notes (Signed)
CSW attempted to complete an assessment with patient on this date. The pt declined stating " I am hearing voices". Pt reports the the voices are telling him he "will always be in the situation you are in and you will never change". CSW will attempt to complete assessment at a later time.m   Teresita Madura, MSW, [employer] Clinical Social Worker

## 2019-03-27 NOTE — [facility] Group Notes (Signed)
[facility] Group Notes:  (Nursing/MHT/Case Management/Adjunct)  Date:  03/27/2019  Time:  8:30 AM  Type of Therapy:  Community Meeting  Participation Level:  Did Not Attend  Participation Quality:    Affect:   Cognitive:    Insight:    Engagement in Group:    Modes of Intervention:    Summary of Progress/Problems:  Kenneth Jensen 03/27/2019, 5:31 PM

## 2019-03-27 NOTE — Plan of Care (Signed)
  Problem: Activity: Goal: Interest or engagement in activities will improve Outcome: Progressing Goal: Sleeping patterns will improve Outcome: Progressing  D: Patient asked if he was hearing voices and he responded "not now". He has been pleasant and cooperative. Socializing with peers in the day room. Expressed concern at being able to get to sleep and said he did not feel safe. Encouraged patient and instructed him that he would be checked on all through the night and that he would be safe. Received Nicotine patch per order for complaint of craving. Denies SI, HI and current AVH. Contracts for safety A; Continue to monitor for safety R: Safety maintained.

## 2019-03-27 NOTE — [facility] Group Notes (Signed)
LCSW Group Therapy Notes  Date and Time: 03/27/19 1:00PM  Type of Therapy and Topic: Group Therapy: Healthy Vs. Unhealthy Coping Strategies  Participation Level: [facility] PARTICIPATION LEVEL: Did Not Attend  Description of Group:  In this group, patients will be encouraged to explore their healthy and unhealthy coping strategics. Coping strategies are actions that we take to deal with stress, problems, or uncomfortable emotions in our daily lives. Each patient will be challenged to read some scenarios and discuss the unhealthy and healthy coping strategies within those scenarios. Also, each patient will be challenged to describe current healthy and unhealthy strategies that they use in their own lives and discuss the outcomes and barriers to those strategies. This group will be process-oriented, with patients participating in exploration of their own experiences as well as giving and receiving support and challenge from other group members.  Therapeutic Goals: 1. Patient will identify personal healthy and unhealthy coping strategies. 2. Patient will identify healthy and unhealthy coping strategies, in others, through scenarios.  3. Patient will identify expected outcomes of healthy and unhealthy coping strategies. 4. Patient will identify barriers to using healthy coping strategies.   Summary of Patient Progress:  X   Therapeutic Modalities:  Cognitive Behavioral Therapy Solution Focused Therapy Motivational Interviewing   Mont Gianni Fuchs, MSW, LCSWA Clinical Social Worker    
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
## 2019-03-27 NOTE — Progress Notes (Signed)
Patient presents sad, and flat. Denies SI, HI, AVH. Isolative to self and room. Minimal interaction with staff and peers. Patient in room sleeping most of day. Comes out for meds and meals. Encouragement and support offered. Safety checks maintained. Medications given as prescribed. Pt receptive and remains safe on unit with q 15 min checks.

## 2019-03-27 NOTE — Plan of Care (Signed)
  Problem: Education: Goal: Knowledge of [facility] Education information/materials will improve Outcome: Progressing Goal: Emotional status will improve Outcome: Progressing Goal: Mental status will improve Outcome: Progressing Goal: Verbalization of understanding the information provided will improve Outcome: Not Progressing   Problem: Activity: Goal: Interest or engagement in activities will improve Outcome: Not Progressing Goal: Sleeping patterns will improve Outcome: Not Progressing

## 2019-03-28 MED ORDER — TRAZODONE HCL 50 MG PO TABS: 50.0000 mg | ORAL_TABLET | Freq: Every evening | ORAL | 1 refills | Status: DC | PRN

## 2019-03-28 MED ORDER — QUETIAPINE FUMARATE 200 MG PO TABS: 200.0000 mg | ORAL_TABLET | Freq: Every day | ORAL | 1 refills | Status: DC

## 2019-03-28 MED ORDER — DIVALPROEX SODIUM ER 500 MG PO TB24: 500.0000 mg | ORAL_TABLET | Freq: Three times a day (TID) | ORAL | 1 refills | Status: DC

## 2019-03-28 NOTE — BHH Suicide Risk Assessment (Signed)
[facility] Discharge Suicide Risk Assessment   Principal Problem: Schizoaffective disorder, bipolar type [facility]) Discharge Diagnoses: Principal Problem:   Schizoaffective disorder, bipolar type (HCC)   Total Time spent with patient: 30 minutes  Musculoskeletal: Strength & Muscle Tone: within normal limits Gait & Station: normal Patient leans: N/A  Psychiatric Specialty Exam: Review of Systems  Constitutional: Negative.   HENT: Negative.   Eyes: Negative.   Respiratory: Negative.   Cardiovascular: Negative.   Gastrointestinal: Negative.   Musculoskeletal: Negative.   Skin: Negative.   Neurological: Negative.   Psychiatric/Behavioral: Negative.     Blood pressure 121/71, pulse 77, temperature 98.4 F (36.9 C), temperature source Oral, resp. rate 18, height 5\' 7"  (1.702 m), weight 101.6 kg, SpO2 100 %.Body mass index is 35.08 kg/m.  General Appearance: Casual  Eye Contact::  Good  Speech:  Clear and Coherent409  Volume:  Normal  Mood:  Euthymic  Affect:  Congruent  Thought Process:  Disorganized  Orientation:  Full (Time, Place, and Person)  Thought Content:  Logical  Suicidal Thoughts:  No  Homicidal Thoughts:  No  Memory:  Immediate;   Fair Recent;   Fair Remote;   Fair  Judgement:  Fair  Insight:  Fair  Psychomotor Activity:  Normal  Concentration:  Fair  Recall:  [id number] of Knowledge:Fair  Language: Fair  Akathisia:  No  Handed:  Right  AIMS (if indicated):     Assets:  Desire for Improvement  Sleep:  Number of Hours: 2.75  Cognition: WNL  ADL's:  Intact   Mental Status Per Nursing Assessment::   On Admission:  NA  Demographic Factors:  Male  Loss Factors: NA  Historical Factors: NA  Risk Reduction Factors:   NA  Continued Clinical Symptoms:  Schizophrenia:   Paranoid or undifferentiated type  Cognitive Features That Contribute To Risk:  None    Suicide Risk:  Minimal: No identifiable suicidal ideation.  Patients presenting with no risk  factors but with morbid ruminations; may be classified as minimal risk based on the severity of the depressive symptoms  Follow-up Information    Services, Psychotherapeutic Follow up on 03/28/2019.   Why: Your ACT team will see you 03/28/19 around 12PM. Thank You! Contact information: [address] [place] [phone]           Plan Of Care/Follow-up recommendations:  Activity:  Activity as tolerated Diet:  Regular diet Other:  Follow-up with act team  [phone], MD 03/28/2019, 12:01 PM

## 2019-03-28 NOTE — BHH Suicide Risk Assessment (Signed)
[facility] INPATIENT:  Family/Significant Other Suicide Prevention Education  Suicide Prevention Education:  Contact Attempts: Kenneth Jensen, cousin [phone] has been identified by the patient as the family member/significant other with whom the patient will be residing, and identified as the person(s) who will aid the patient in the event of a mental health crisis.  With written consent from the patient, two attempts were made to provide suicide prevention education, prior to and/or following the patient's discharge.  We were unsuccessful in providing suicide prevention education.  A suicide education pamphlet was given to the patient to share with family/significant other.  Date and time of first attempt: 03/28/19 at 9:44am Date and time of second attempt:  CSW unable to leave vm due to vm not being set up.  Charlann Lange Brittlyn Cloe MSW LCSW 03/28/2019, 9:46 AM

## 2019-03-28 NOTE — Progress Notes (Signed)
Recreation Therapy Notes    Date: 03/28/2019  Time: 9:30 am  Location: Craft Room  Behavioral response: Appropriate   Intervention Topic: Self-esteem    Discussion/Intervention:  Group content today was focused on self-esteem. Patient defined self-esteem and where it comes form. The group described reasons self-esteem is important. Individuals stated things that impact self-esteem and positive ways to improve self-esteem. The group participated in the intervention "Building self-esteem" where patients were able come up with new ways to improve their self-esteem.  Clinical Observations/Feedback:  Patient came to group and defined self-esteem as trying to get confidence. He stated that self-esteem comes from family and peers. Participant expressed that self-esteem is important because with out you will feel down. Individual was social with peers and staff while participant in the intervention during group.  Clemmie Buelna LRT/CTRS         Laksh Hinners 03/28/2019 11:03 AM
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
## 2019-03-28 NOTE — Progress Notes (Signed)
  [facility] Adult Case Management Discharge Plan :  Will you be returning to the same living situation after discharge:  Yes,  home At discharge, do you have transportation home?: Yes,  pts ACT team will pick him up Do you have the ability to pay for your medications: Yes,  medicaid and medicare  Release of information consent forms completed and in the chart;    Patient to Follow up at: Follow-up Information    Services, Psychotherapeutic Follow up on 03/28/2019.   Why: Your ACT team will see you 03/28/19 around 12PM. Thank You! Contact information: [address] [place] [address] [phone]           Next level of care provider has access to [facility] Link:no  Safety Planning and Suicide Prevention discussed: Yes,  SPE completed with pt due to CSW unable to reach pts cousin  Have you used any form of tobacco in the last 30 days? (Cigarettes, Smokeless Tobacco, Cigars, and/or Pipes): Yes  Has patient been referred to the Quitline?: Patient refused referral  Patient has been referred for addiction treatment: N/A  Mechele Dawley, LCSW 03/28/2019, 10:21 AM

## 2019-03-28 NOTE — Progress Notes (Signed)
Patient denies SI/HI, denies A/V hallucinations. Patient verbalizes understanding of discharge instructions, follow up care and prescriptions. Patient given all belongings from [facility] locker. Patient escorted out by staff, transported by ACT team 

## 2019-04-04 ENCOUNTER — Other Ambulatory Visit: Payer: Self-pay

## 2019-04-04 ENCOUNTER — Ambulatory Visit: Payer: Medicare HMO | Attending: Internal Medicine

## 2019-04-04 DIAGNOSIS — Z23 Encounter for immunization: Secondary | ICD-10-CM

## 2019-04-04 NOTE — Progress Notes (Signed)
   Covid-19 Vaccination Clinic  Name:  Kenneth Jensen    MRN: [id number] DOB: 10-Dec-1990  04/04/2019  [name] was observed post Covid-19 immunization for 15 minutes without incident. He was provided with Vaccine Information Sheet and instruction to access the V-Safe system.   [name] was instructed to call 911 with any severe reactions post vaccine: Marland Kitchen Difficulty breathing  . Swelling of face and throat  . A fast heartbeat  . A bad rash all over body  . Dizziness and weakness   Immunizations Administered    Name Date Dose VIS Date Route   Pfizer COVID-19 Vaccine 04/04/2019 12:28 PM 0.3 mL 12/17/2018 Intramuscular   Manufacturer: [employer], [employer]   Lot: [id number]   NDC: [address]-8

## 2019-04-13 ENCOUNTER — Emergency Department
Admission: EM | Admit: 2019-04-13 | Discharge: 2019-04-14 | Disposition: A | Discharge status: Home / Self Care | Payer: Medicare HMO | Attending: Emergency Medicine | Admitting: Emergency Medicine

## 2019-04-13 ENCOUNTER — Other Ambulatory Visit: Payer: Self-pay

## 2019-04-13 DIAGNOSIS — Z03818 Encounter for observation for suspected exposure to other biological agents ruled out: Secondary | ICD-10-CM | POA: Diagnosis not present

## 2019-04-13 DIAGNOSIS — F17200 Nicotine dependence, unspecified, uncomplicated: Secondary | ICD-10-CM | POA: Insufficient documentation

## 2019-04-13 DIAGNOSIS — F209 Schizophrenia, unspecified: Secondary | ICD-10-CM | POA: Diagnosis not present

## 2019-04-13 DIAGNOSIS — Z20822 Contact with and (suspected) exposure to covid-19: Secondary | ICD-10-CM | POA: Insufficient documentation

## 2019-04-13 DIAGNOSIS — Z79899 Other long term (current) drug therapy: Secondary | ICD-10-CM | POA: Insufficient documentation

## 2019-04-13 DIAGNOSIS — F919 Conduct disorder, unspecified: Secondary | ICD-10-CM | POA: Diagnosis present

## 2019-04-13 NOTE — ED Notes (Signed)
Pt. Alert and oriented, warm and dry, in no distress. Pt. Denies SI, HI, and AVH. Pt. Encouraged to let nursing staff know of any concerns or needs. 

## 2019-04-13 NOTE — ED Notes (Signed)
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
## 2019-04-14 DIAGNOSIS — F209 Schizophrenia, unspecified: Secondary | ICD-10-CM | POA: Diagnosis not present

## 2019-04-14 LAB — RESPIRATORY PANEL BY RT PCR (FLU A&B, COVID)
Influenza A by PCR: NEGATIVE
Influenza B by PCR: NEGATIVE
SARS Coronavirus 2 by RT PCR: NEGATIVE

## 2019-04-14 LAB — COMPREHENSIVE METABOLIC PANEL
ALT: 20 U/L (ref 0–44)
AST: 27 U/L (ref 15–41)
Albumin: 4.3 g/dL (ref 3.5–5.0)
Alkaline Phosphatase: 57 U/L (ref 38–126)
Anion gap: 9 (ref 5–15)
BUN: 12 mg/dL (ref 6–20)
CO2: 26 mmol/L (ref 22–32)
Calcium: 9 mg/dL (ref 8.9–10.3)
Chloride: 103 mmol/L (ref 98–111)
Creatinine, Ser: 0.82 mg/dL (ref 0.61–1.24)
GFR calc Af Amer: >60 mL/min (ref >60)
GFR calc non Af Amer: >60 mL/min (ref >60)
Glucose, Bld: 96 mg/dL (ref 70–99)
Potassium: 3.9 mmol/L (ref 3.5–5.1)
Sodium: 138 mmol/L (ref 135–145)
Total Bilirubin: 0.8 mg/dL (ref 0.3–1.2)
Total Protein: 8 g/dL (ref 6.5–8.1)

## 2019-04-14 LAB — URINE DRUG SCREEN, QUALITATIVE (ARMC ONLY)
Amphetamines, Ur Screen: NOT DETECTED
Barbiturates, Ur Screen: NOT DETECTED
Benzodiazepine, Ur Scrn: NOT DETECTED
Cannabinoid 50 Ng, Ur ~~LOC~~: POSITIVE — AB
Cocaine Metabolite,Ur ~~LOC~~: NOT DETECTED
MDMA (Ecstasy)Ur Screen: NOT DETECTED
Methadone Scn, Ur: NOT DETECTED
Opiate, Ur Screen: NOT DETECTED
Phencyclidine (PCP) Ur S: NOT DETECTED
Tricyclic, Ur Screen: NOT DETECTED

## 2019-04-14 LAB — CBC
HCT: 41.8 % (ref 39.0–52.0)
Hemoglobin: 14.1 g/dL (ref 13.0–17.0)
MCH: 31.2 pg (ref 26.0–34.0)
MCHC: 33.7 g/dL (ref 30.0–36.0)
MCV: 92.5 fL (ref 80.0–100.0)
Platelets: 314 10*3/uL (ref 150–400)
RBC: 4.52 MIL/uL (ref 4.22–5.81)
RDW: 13.7 % (ref 11.5–15.5)
WBC: 10.9 10*3/uL — ABNORMAL HIGH (ref 4.0–10.5)
nRBC: 0 % (ref 0.0–0.2)

## 2019-04-14 LAB — SALICYLATE LEVEL: Salicylate Lvl: <7 mg/dL — ABNORMAL LOW (ref 7.0–30.0)

## 2019-04-14 LAB — ETHANOL: Alcohol, Ethyl (B): <10 mg/dL (ref <10)

## 2019-04-14 LAB — ACETAMINOPHEN LEVEL: Acetaminophen (Tylenol), Serum: <10 ug/mL — ABNORMAL LOW (ref 10–30)

## 2019-04-14 MED ORDER — TRAZODONE HCL 50 MG PO TABS: 50.0000 mg | ORAL_TABLET | Freq: Every evening | ORAL | 0 refills | Status: DC | PRN

## 2019-04-14 MED ORDER — NICOTINE 21 MG/24HR TD PT24
21.0000 mg | MEDICATED_PATCH | Freq: Once | TRANSDERMAL | Status: DC
  Administered 2019-04-14: 01:00:00 21 mg via TRANSDERMAL
  Filled 2019-04-14: qty 1

## 2019-04-14 MED ORDER — QUETIAPINE FUMARATE 200 MG PO TABS: 200.0000 mg | ORAL_TABLET | Freq: Every day | ORAL | 0 refills | Status: DC

## 2019-04-14 MED ORDER — DIVALPROEX SODIUM ER 500 MG PO TB24: 500.0000 mg | ORAL_TABLET | Freq: Three times a day (TID) | ORAL | 0 refills | Status: DC

## 2019-04-14 NOTE — BH Assessment (Signed)
Assessment Note  Kenneth Jensen is an 29 y.o. male presenting to [facility] ED voluntarily due to his dislike for his current living conditions. Per triage note  Pt to the er for problems at home. Pt says people are still messing with him. Pt lives with cousin but wants his own place. Pt says his mother is his guardian. During the assessment patient was alert and oriented x4, pleasant and cooperative. Patient reported why he was presenting to the ED due to his cousins no longer wanting him to live in the home and is reporting that he is worried about being homeless. Patient reported that he is currently engaged with [facility] team and that they are aware of his living conditions. Patient reported he is currently having AH "they voices tell me you ain't got no home, your mother say you ain't worth it." Patient reported "why can't I stay in the here in the hospital" once patient was told that did not meet criteria for Inpatient. Psyc NP discussed and educated the patient that the hospital wasn't a place to live at while his ACTT team finds him a place to live. It was discussed with the patient that he should follow up with his ACTT for his housing needs. After patient was told this information he no longer wanted to participate in th assessment. Patient denied SI/HI/AH/VH and does not appear to be responding to any internal or external stimuli   Per Psyc NP patient does not meet criteria for Inpatient Hospitalization.   Diagnosis: Depression, Schizophrenia  Past Medical History:  Past Medical History:  Diagnosis Date  . Asthma   . Bipolar 1 disorder (HCC)   . Depression   . Schizophrenia (HCC)   . Schizotaxia     History reviewed. No pertinent surgical history.  Family History: No family history on file.  Social History:  reports that he has been smoking. He has never used smokeless tobacco. He reports current alcohol use. He reports current drug use. Drug: Marijuana.  Additional Social History:   Alcohol / Drug Use Pain Medications: See MAR Prescriptions: See MAR Over the Counter: See MAR History of alcohol / drug use?: No history of alcohol / drug abuse  CIWA: CIWA-Ar BP: (!) 143/75 Pulse Rate: (!) 101 COWS:    Allergies:  Allergies  Allergen Reactions  . Haldol [Haloperidol]     Pt states "I went crazy"  . Percocet [Oxycodone-Acetaminophen]   . Vicodin [Hydrocodone-Acetaminophen] Itching  . Vicodin [Hydrocodone-Acetaminophen]     Home Medications: (Not in a hospital admission)   OB/GYN Status:  No LMP for male patient.  General Assessment Data Location of Assessment: [facility] ED TTS Assessment: In system Is this a Tele or Face-to-Face Assessment?: Face-to-Face Is this an Initial Assessment or a Re-assessment for this encounter?: Initial Assessment Patient Accompanied by:: N/A Language Other than English: No Living Arrangements: Other (Comment)(Private Residence with family) What gender do you identify as?: Male Marital status: Single Living Arrangements: Other relatives Can pt return to current living arrangement?: Yes Admission Status: Voluntary Is patient capable of signing voluntary admission?: Yes Referral Source: Self/Family/Friend Insurance type: [employer] Medicare  Medical Screening Exam [facility] Walk-in ONLY) Medical Exam completed: Yes  Crisis Care Plan Living Arrangements: Other relatives Legal Guardian: Other:(Self) Name of Psychiatrist: [facility] Name of Therapist: [facility]  Education Status Is patient currently in school?: No Is the patient employed, unemployed or receiving disability?: Receiving disability income  Risk to self with the past  6 months Suicidal Ideation: No Has patient been a risk to self within the past 6 months prior to admission? : No Suicidal Intent: No Has patient had any suicidal intent within the past 6 months prior to admission? : No Is patient at risk for suicide?:  No Suicidal Plan?: No Has patient had any suicidal plan within the past 6 months prior to admission? : No Access to Means: No What has been your use of drugs/alcohol within the last 12 months?: None Previous Attempts/Gestures: No How many times?: 0 Triggers for Past Attempts: None known Intentional Self Injurious Behavior: None Family Suicide History: Unknown Recent stressful life event(s): Other (Comment)(Current living conditions) Persecutory voices/beliefs?: No Depression: No Substance abuse history and/or treatment for substance abuse?: No Suicide prevention information given to non-admitted patients: Not applicable  Risk to Others within the past 6 months Homicidal Ideation: No Does patient have any lifetime risk of violence toward others beyond the six months prior to admission? : No Thoughts of Harm to Others: No Current Homicidal Intent: No Current Homicidal Plan: No Access to Homicidal Means: No History of harm to others?: No Assessment of Violence: None Noted Does patient have access to weapons?: No Criminal Charges Pending?: No Does patient have a court date: No Is patient on probation?: No  Psychosis Hallucinations: Auditory Delusions: None noted  Mental Status Report Appearance/Hygiene: In scrubs Eye Contact: Good Motor Activity: Freedom of movement Speech: Logical/coherent, Slow Level of Consciousness: Alert Mood: Anxious Affect: Appropriate to circumstance Anxiety Level: Moderate Thought Processes: Coherent Judgement: Unimpaired Orientation: Person, Place, Time, Situation, Appropriate for developmental age Obsessive Compulsive Thoughts/Behaviors: None  Cognitive Functioning Concentration: Normal Memory: Recent Intact, Remote Intact Is patient IDD: No Insight: Good Impulse Control: Good Appetite: Good Have you had any weight changes? : No Change Sleep: No Change Total Hours of Sleep: 8 Vegetative Symptoms: None  ADLScreening [facility] Assessment  Services) Patient's cognitive ability adequate to safely complete daily activities?: Yes Patient able to express need for assistance with ADLs?: Yes Independently performs ADLs?: Yes (appropriate for developmental age)  Prior Inpatient Therapy Prior Inpatient Therapy: Yes Prior Therapy Dates: 09/03/2018,07/2018 Prior Therapy Facilty/Provider(s): [facility] Reason for Treatment: Schizophrenia  Prior Outpatient Therapy Prior Outpatient Therapy: Yes Prior Therapy Dates: Current Prior Therapy Facilty/Provider(s): [facility] Reason for Treatment: Schizophrenia Does patient have an ACCT team?: Yes([facility]) Does patient have Intensive In-House Services?  : No Does patient have [facility] services? : No Does patient have P4CC services?: No  ADL Screening (condition at time of admission) Patient's cognitive ability adequate to safely complete daily activities?: Yes Is the patient deaf or have difficulty hearing?: No Does the patient have difficulty seeing, even when wearing glasses/contacts?: No Does the patient have difficulty concentrating, remembering, or making decisions?: No Patient able to express need for assistance with ADLs?: Yes Does the patient have difficulty dressing or bathing?: No Independently performs ADLs?: Yes (appropriate for developmental age) Does the patient have difficulty walking or climbing stairs?: No Weakness of Legs: None Weakness of Arms/Hands: None  Home Assistive Devices/Equipment Home Assistive Devices/Equipment: None  Therapy Consults (therapy consults require a physician order) PT Evaluation Needed: No OT Evalulation Needed: No SLP Evaluation Needed: No Abuse/Neglect Assessment (Assessment to be complete while patient is alone) Physical Abuse: Denies Verbal Abuse: Denies Sexual Abuse: Denies Exploitation of patient/patient's resources: Denies Self-Neglect: Denies Values / Beliefs Cultural Requests During  Hospitalization: None Spiritual Requests During Hospitalization: None Consults Spiritual Care Consult Needed: No Transition of Care Team  Consult Needed: No Advance Directives (For Healthcare) Does Patient Have a Medical Advance Directive?: No          Disposition: Per Psyc NP patient does not meet criteria for Inpatient Hospitalization.  Disposition Initial Assessment Completed for this Encounter: Yes Patient referred to: (Back to [facility])  On Site Evaluation by:   Reviewed with Physician:    Benay Pike MS LCASA 04/14/2019 5:19 AM

## 2019-04-14 NOTE — ED Notes (Signed)
Pt discharged in care of [facility] from [facility] Team.  VS stable.  Pt denies SI/HI. Discharge instructions and prescriptions reviewed with patient. All belongings returned to patient.

## 2019-04-14 NOTE — Consult Note (Signed)
[facility] Face-to-Face Psychiatry Consult   Reason for Consult: Psychiatric evaluation Referring Physician:  Dr. Don Perking Patient Identification: Kenneth Jensen MRN:  [id number] Principal Diagnosis: <principal problem not specified> Diagnosis:  Active Problems:   * No active hospital problems. *   Total Time spent with patient: 30 minutes  Subjective: "My cousin say I can live in her house anymore." Kenneth Jensen is a 29 y.o. male patient  presented to [facility] ED via POV voluntarily. Per the ED triage nursing note, the patient was seen in the ER due to home problems.  The patient stated that his cousin does not want him living in her home. The patient was seen face-to-face by this provider; chart reviewed and consulted with Dr. Manson Passey on 04/14/2019 due to the patient's care. It was discussed with the EDP that the patient does not meet the criteria to be admitted to the psychiatric inpatient unit.  On evaluation, the patient is alert and oriented x 4, calm, speech is slow, cooperative, mood-congruent with affect.  The patient does not appear to be responding to internal stimuli. The patient is not presenting with any delusional thinking. The patient admits to auditory hallucinations stating the voices tell him, "I have nowhere to go, nobody wants me in the home, I am not worth anything."  He denies visual hallucinations. The patient denies suicidal, homicidal, or self-harm ideations. The patient is not presenting with psychotic or paranoid behaviors. The patient insists on complaining about him not having a place to live.  He states, "why cannot I live in an apartment?  He voiced, "why cannot stay here (hospital)?  The patient was educated about the hospital being a place that people come to when they are ill.  It was discussed with the patient he cannot live at the hospital.  The patient was educated that a hospital is a place that people come to when they are ill and when to get well the ER  discharge from the hospital.  It was discussed with the patient that he has an ACT team that is supposed to assist him in finding a safe, comfortable environment for him to live in. During an encounter with the patient, he became upset and ended the assessment.  Plan: The patient is not a safety risk to self, others and does not require psychiatric inpatient admission for stabilization and treatment. HPI: Per Dr. Manson Passey: Kenneth Jensen is a 29 y.o. male with below list of previous medical conditions including schizophrenia presents to the emergency department stating "I want my own place".  Patient states that he has been having verbal altercation with his cousin with whom he resides.  Patient denies any suicidal or homicidal ideation.  Patient denies any auditory visual hallucinations.  Past Psychiatric History:  Depression Schizophrenia (HCC)  Risk to Self:   No Risk to Others:  No Prior Inpatient Therapy:   Yes Prior Outpatient Therapy:   Yes  Past Medical History:  Past Medical History:  Diagnosis Date  . Asthma   . Bipolar 1 disorder (HCC)   . Depression   . Schizophrenia (HCC)   . Schizotaxia    History reviewed. No pertinent surgical history. Family History: No family history on file. Family Psychiatric  History: Unknown Social History:  Social History   Substance and Sexual Activity  Alcohol Use Yes   Comment: social     Social History   Substance and Sexual Activity  Drug Use Yes  . Types: Marijuana    Social  History   Socioeconomic History  . Marital status: Single    Spouse name: Not on file  . Number of children: Not on file  . Years of education: Not on file  . Highest education level: Not on file  Occupational History  . Not on file  Tobacco Use  . Smoking status: Current Every Day Smoker  . Smokeless tobacco: Never Used  Substance and Sexual Activity  . Alcohol use: Yes    Comment: social  . Drug use: Yes    Types: Marijuana  . Sexual  activity: Not on file  Other Topics Concern  . Not on file  Social History Narrative   ** Merged History Encounter **       Social Determinants of Health   Financial Resource Strain:   . Difficulty of Paying Living Expenses:   Food Insecurity:   . Worried About Programme researcher, broadcasting/film/video in the Last Year:   . Barista in the Last Year:   Transportation Needs:   . Freight forwarder (Medical):   Marland Kitchen Lack of Transportation (Non-Medical):   Physical Activity:   . Days of Exercise per Week:   . Minutes of Exercise per Session:   Stress:   . Feeling of Stress :   Social Connections:   . Frequency of Communication with Friends and Family:   . Frequency of Social Gatherings with Friends and Family:   . Attends Religious Services:   . Active Member of Clubs or Organizations:   . Attends Banker Meetings:   Marland Kitchen Marital Status:    Additional Social History:    Allergies:   Allergies  Allergen Reactions  . Haldol [Haloperidol]     Pt states "I went crazy"  . Percocet [Oxycodone-Acetaminophen]   . Vicodin [Hydrocodone-Acetaminophen] Itching  . Vicodin [Hydrocodone-Acetaminophen]     Labs:  Results for orders placed or performed during the hospital encounter of 04/13/19 (from the past 48 hour(s))  CBC     Status: Abnormal   Collection Time: 04/13/19 10:03 PM  Result Value Ref Range   WBC 10.9 (H) 4.0 - 10.5 K/uL   RBC 4.52 4.22 - 5.81 MIL/uL   Hemoglobin 14.1 13.0 - 17.0 g/dL   HCT 35.3 29.9 - 24.2 %   MCV 92.5 80.0 - 100.0 fL   MCH 31.2 26.0 - 34.0 pg   MCHC 33.7 30.0 - 36.0 g/dL   RDW 68.3 41.9 - 62.2 %   Platelets 314 150 - 400 K/uL   nRBC 0.0 0.0 - 0.2 %    Comment: Performed at [facility], [address], [place], [place] [address]  Acetaminophen level     Status: Abnormal   Collection Time: 04/13/19 10:03 PM  Result Value Ref Range   Acetaminophen (Tylenol), Serum <10 (L) 10 - 30 ug/mL    Comment: (NOTE) Therapeutic concentrations vary  significantly. A range of 10-30 ug/mL  may be an effective concentration for many patients. However, some  are best treated at concentrations outside of this range. Acetaminophen concentrations >150 ug/mL at 4 hours after ingestion  and >50 ug/mL at 12 hours after ingestion are often associated with  toxic reactions. Performed at [facility], [address] [place], [place] [address]   Salicylate level     Status: Abnormal   Collection Time: 04/13/19 10:03 PM  Result Value Ref Range   Salicylate Lvl <7.0 (L) 7.0 - 30.0 mg/dL    Comment: Performed at [facility], [place], [place] [address]  Ethanol     Status: None   Collection Time: 04/13/19 10:03 PM  Result Value Ref Range   Alcohol, Ethyl (B) <10 <10 mg/dL    Comment: (NOTE) Lowest detectable limit for serum alcohol is 10 mg/dL. For medical purposes only. Performed at [facility], [address] [place], [place] [address]   Comprehensive metabolic panel     Status: None   Collection Time: 04/13/19 10:03 PM  Result Value Ref Range   Sodium 138 135 - 145 mmol/L   Potassium 3.9 3.5 - 5.1 mmol/L   Chloride 103 98 - 111 mmol/L   CO2 26 22 - 32 mmol/L   Glucose, Bld 96 70 - 99 mg/dL    Comment: Glucose reference range applies only to samples taken after fasting for at least 8 hours.   BUN 12 6 - 20 mg/dL   Creatinine, Ser 0.82 0.61 - 1.24 mg/dL   Calcium 9.0 8.9 - 10.3 mg/dL   Total Protein 8.0 6.5 - 8.1 g/dL   Albumin 4.3 3.5 - 5.0 g/dL   AST 27 15 - 41 U/L   ALT 20 0 - 44 U/L   Alkaline Phosphatase 57 38 - 126 U/L   Total Bilirubin 0.8 0.3 - 1.2 mg/dL   GFR calc non Af Amer >60 >60 mL/min   GFR calc Af Amer >60 >60 mL/min   Anion gap 9 5 - 15    Comment: Performed at [facility], [address] [place], [place] [address]  Urine Drug Screen, Qualitative ([place] only)     Status: Abnormal   Collection Time: 04/13/19 10:03 PM  Result Value Ref Range   Tricyclic,  Ur Screen NONE DETECTED NONE DETECTED   Amphetamines, Ur Screen NONE DETECTED NONE DETECTED   MDMA (Ecstasy)Ur Screen NONE DETECTED NONE DETECTED   Cocaine Metabolite,Ur [place] NONE DETECTED NONE DETECTED   Opiate, Ur Screen NONE DETECTED NONE DETECTED   Phencyclidine (PCP) Ur S NONE DETECTED NONE DETECTED   Cannabinoid 50 Ng, Ur [place] POSITIVE (A) NONE DETECTED   Barbiturates, Ur Screen NONE DETECTED NONE DETECTED   Benzodiazepine, Ur Scrn NONE DETECTED NONE DETECTED   Methadone Scn, Ur NONE DETECTED NONE DETECTED    Comment: (NOTE) Tricyclics + metabolites, urine    Cutoff 1000 ng/mL Amphetamines + metabolites, urine  Cutoff 1000 ng/mL MDMA (Ecstasy), urine              Cutoff 500 ng/mL Cocaine Metabolite, urine          Cutoff 300 ng/mL Opiate + metabolites, urine        Cutoff 300 ng/mL Phencyclidine (PCP), urine         Cutoff 25 ng/mL Cannabinoid, urine                 Cutoff 50 ng/mL Barbiturates + metabolites, urine  Cutoff 200 ng/mL Benzodiazepine, urine              Cutoff 200 ng/mL Methadone, urine                   Cutoff 300 ng/mL The urine drug screen provides only a preliminary, unconfirmed analytical test result and should not be used for non-medical purposes. Clinical consideration and professional judgment should be applied to any positive drug screen result due to possible interfering substances. A more specific alternate chemical method must be used in order to obtain a confirmed analytical result. Gas chromatography /  mass spectrometry (GC/MS) is the preferred confirmat ory method. Performed at [facility], [address], [place], [place] [address]   Respiratory Panel by RT PCR (Flu A&B, Covid) - Nasopharyngeal Swab     Status: None   Collection Time: 04/14/19  1:21 AM   Specimen: Nasopharyngeal Swab  Result Value Ref Range   SARS Coronavirus 2 by RT PCR NEGATIVE NEGATIVE    Comment: (NOTE) SARS-CoV-2 target nucleic acids are NOT DETECTED. The  SARS-CoV-2 RNA is generally detectable in upper respiratoy specimens during the acute phase of infection. The lowest concentration of SARS-CoV-2 viral copies this assay can detect is 131 copies/mL. A negative result does not preclude SARS-Cov-2 infection and should not be used as the sole basis for treatment or other patient management decisions. A negative result may occur with  improper specimen collection/handling, submission of specimen other than nasopharyngeal swab, presence of viral mutation(s) within the areas targeted by this assay, and inadequate number of viral copies (<131 copies/mL). A negative result must be combined with clinical observations, patient history, and epidemiological information. The expected result is Negative. Fact Sheet for Patients:  https://www.moore.com/ Fact Sheet for Healthcare Providers:  https://www.young.biz/ This test is not yet ap proved or cleared by the [place] FDA and  has been authorized for detection and/or diagnosis of SARS-CoV-2 by FDA under an Emergency Use Authorization (EUA). This EUA will remain  in effect (meaning this test can be used) for the duration of the COVID-19 declaration under Section 564(b)(1) of the Act, 21 U.S.C. section 360bbb-3(b)(1), unless the authorization is terminated or revoked sooner.    Influenza A by PCR NEGATIVE NEGATIVE   Influenza B by PCR NEGATIVE NEGATIVE    Comment: (NOTE) The Xpert Xpress SARS-CoV-2/FLU/RSV assay is intended as an aid in  the diagnosis of influenza from Nasopharyngeal swab specimens and  should not be used as a sole basis for treatment. Nasal washings and  aspirates are unacceptable for Xpert Xpress SARS-CoV-2/FLU/RSV  testing. Fact Sheet for Patients: https://www.moore.com/ Fact Sheet for Healthcare Providers: https://www.young.biz/ This test is not yet approved or cleared by the [place] FDA  and  has been authorized for detection and/or diagnosis of SARS-CoV-2 by  FDA under an Emergency Use Authorization (EUA). This EUA will remain  in effect (meaning this test can be used) for the duration of the  Covid-19 declaration under Section 564(b)(1) of the Act, 21  U.S.C. section 360bbb-3(b)(1), unless the authorization is  terminated or revoked. Performed at [facility], [address], [place], [place] [address]     Current Facility-Administered Medications  Medication Dose Route Frequency Provider Last Rate Last Admin  . nicotine (NICODERM CQ - dosed in mg/24 hours) patch 21 mg  21 mg Transdermal Once Darci Current, MD   21 mg at 04/14/19 0119   Current Outpatient Medications  Medication Sig Dispense Refill  . divalproex (DEPAKOTE ER) 500 MG 24 hr tablet Take 1 tablet (500 mg total) by mouth 3 (three) times daily. 90 tablet 1  . divalproex (DEPAKOTE) 500 MG DR tablet Take 3 tablets (1,500 mg total) by mouth at bedtime. 90 tablet 2  . INVEGA SUSTENNA 234 MG/1.5ML SUSY injection INJECT 1.5MLS (234MG ) INTRAMUSCULARLY EVERY 2 WEEKS 1.8 mL 4  . INVEGA SUSTENNA 234 MG/1.5ML SUSY injection Inject 234 mg into the muscle every 30 (thirty) days.    lactulose (CHRONULAC) 10 GM/15ML solution Take 30 mLs (20 g total) by mouth daily as needed for mild constipation. 120  mL 0  . QUEtiapine (SEROQUEL) 200 MG tablet Take 1 tablet (200 mg total) by mouth at bedtime. 30 tablet 1  . QUEtiapine (SEROQUEL) 300 MG tablet Take 1 tablet (300 mg total) by mouth at bedtime. 30 tablet 2  . traZODone (DESYREL) 50 MG tablet Take 1 tablet (50 mg total) by mouth at bedtime. 30 tablet 2  . traZODone (DESYREL) 50 MG tablet Take 1 tablet (50 mg total) by mouth at bedtime as needed for sleep. 30 tablet 1    Musculoskeletal: Strength & Muscle Tone: within normal limits Gait & Station: normal Patient leans: N/A  Psychiatric Specialty Exam: Physical Exam  Nursing note and vitals  reviewed. Constitutional: He appears well-developed and well-nourished.  Cardiovascular: Normal rate.  Respiratory: Effort normal.  Musculoskeletal:        General: Normal range of motion.     Cervical back: Normal range of motion and neck supple.  Psychiatric: His behavior is normal.    Review of Systems  Psychiatric/Behavioral: Positive for sleep disturbance. The patient is nervous/anxious.   All other systems reviewed and are negative.   Blood pressure (!) 143/75, pulse (!) 101, temperature 98.3 F (36.8 C), temperature source Oral, resp. rate 16, height 5\' 9"  (1.753 m), weight 108.9 kg, SpO2 98 %.Body mass index is 35.44 kg/m.  General Appearance: Casual and Fairly Groomed  Eye Contact:  Poor  Speech:  Clear and Coherent  Volume:  Decreased  Mood:  Depressed and Hopeless  Affect:  Blunt, Congruent, Depressed and Flat  Thought Process:  Coherent  Orientation:  Full (Time, Place, and Person)  Thought Content:  WDL and Logical  Suicidal Thoughts:  No  Homicidal Thoughts:  No  Memory:  Immediate;   Good Recent;   Good Remote;   Good  Judgement:  Fair  Insight:  Fair  Psychomotor Activity:  Normal  Concentration:  Concentration: Good and Attention Span: Good  Recall:  Good  Fund of Knowledge:  Fair  Language:  Good  Akathisia:  Negative  Handed:  Right  AIMS (if indicated):     Assets:  Communication Skills Desire for Improvement Housing Resilience Social Support  ADL's:  Intact  Cognition:  WNL  Sleep:   Okay     Treatment Plan Summary: Medication management and Plan Patient does not meet criteria for psychiatric inpatient admission.  Disposition: No evidence of imminent risk to self or others at present.   Patient does not meet criteria for psychiatric inpatient admission. Supportive therapy provided about ongoing stressors. Discussed crisis plan, support from social network, calling 911, coming to the Emergency Department, and calling Suicide  Hotline.  Gillermo MurdochJacqueline Arayah Krouse, NP 04/14/2019 4:52 AM

## 2019-04-14 NOTE — ED Notes (Signed)
Pt. Up using bathroom, pt. Returned to room with steady gait. 

## 2019-04-14 NOTE — ED Notes (Signed)
VOL/Consult completed/Pending Disposition 

## 2019-04-14 NOTE — ED Provider Notes (Signed)
[facility] Emergency Department Provider Note  ____________________________________________   First MD Initiated Contact with Patient 04/13/19 2347     (approximate)  I have reviewed the triage vital signs and the nursing notes.   HISTORY  Chief Complaint Behavior Problem    HPI Kenneth Jensen is a 29 y.o. male with below list of previous medical conditions including schizophrenia presents to the emergency department stating "I want my own place".  Patient states that he has been having verbal altercation with his cousin with whom he resides.  Patient denies any suicidal or homicidal ideation.  Patient denies any auditory visual hallucinations.       Past Medical History:  Diagnosis Date  . Asthma   . Bipolar 1 disorder (HCC)   . Depression   . Schizophrenia (HCC)   . Schizotaxia     Patient Active Problem List   Diagnosis Date Noted  . Schizophrenia, unspecified (HCC) 03/25/2019  . 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 Bipolar 1 disorder (HCC) 03/25/2019  . Depression 03/25/2019  . Schizoaffective disorder, bipolar type (HCC) 03/25/2019  . Schizophrenia (HCC) 08/02/2018    History reviewed. No pertinent surgical history.  Prior to Admission medications   Medication Sig Start Date End Date Taking? Authorizing Provider  divalproex (DEPAKOTE ER) 500 MG 24 hr tablet Take 1 tablet (500 mg total) by mouth 3 (three) times daily. 03/28/19   Money, Gerlene Burdock, FNP  divalproex (DEPAKOTE) 500 MG DR tablet Take 3 tablets (1,500 mg total) by mouth at bedtime. 08/17/18   Clapacs, Jackquline Denmark, MD  INVEGA SUSTENNA 234 MG/1.5ML SUSY injection INJECT 1.5MLS (234MG ) INTRAMUSCULARLY EVERY 2 WEEKS 08/26/18   Clapacs, 08/28/18, MD  INVEGA SUSTENNA 234 MG/1.5ML SUSY injection Inject 234 mg into the muscle every 30 (thirty) days. 03/21/19   [provider]  lactulose (CHRONULAC) 10 GM/15ML solution Take 30 mLs (20 g total) by mouth daily as needed for mild constipation.  09/14/18   11/14/18., MD  QUEtiapine (SEROQUEL) 200 MG tablet Take 1 tablet (200 mg total) by mouth at bedtime. 03/28/19   Money, 03/30/19, FNP  QUEtiapine (SEROQUEL) 300 MG tablet Take 1 tablet (300 mg total) by mouth at bedtime. 08/17/18   Clapacs, 10/17/18, MD  traZODone (DESYREL) 50 MG tablet Take 1 tablet (50 mg total) by mouth at bedtime. 08/17/18   Clapacs, 10/17/18, MD  traZODone (DESYREL) 50 MG tablet Take 1 tablet (50 mg total) by mouth at bedtime as needed for sleep. 03/28/19   Money, 03/30/19, FNP    Allergies Haldol [haloperidol], Percocet [oxycodone-acetaminophen], Vicodin [hydrocodone-acetaminophen], and Vicodin [hydrocodone-acetaminophen]  No family history on file.  Social History Social History   Tobacco Use  . Smoking status: Current Every Day Smoker  . Smokeless tobacco: Never Used  Substance Use Topics  . Alcohol use: Yes    Comment: social  . Drug use: Yes    Types: Marijuana    Review of Systems Constitutional: No fever/chills Eyes: No visual changes. ENT: No sore throat. Cardiovascular: Denies chest pain. Respiratory: Denies shortness of breath. Gastrointestinal: No abdominal pain.  No nausea, no vomiting.  No diarrhea.  No constipation. Genitourinary: Negative for dysuria. Musculoskeletal: Negative for neck pain.  Negative for back pain. Integumentary: Negative for rash. Neurological: Negative for headaches, focal weakness or numbness. Psychiatric:  Negative for homicidal or suicidal ideation.  Negative for auditory visual hallucinations.   ____________________________________________   PHYSICAL EXAM:  VITAL SIGNS: ED Triage Vitals  Enc Vitals  Group     BP 04/13/19 2201 (!) 143/75     Pulse Rate 04/13/19 2201 (!) 101     Resp 04/13/19 2201 16     Temp 04/13/19 2201 98.3 F (36.8 C)     Temp Source 04/13/19 2201 Oral     SpO2 04/13/19 2201 98 %     Weight 04/13/19 2202 108.9 kg (240 lb)     Height 04/13/19 2202 1.753 m (5\' 9" )     Head  Circumference --      Peak Flow --      Pain Score 04/13/19 2218 9     Pain Loc --      Pain Edu? --      Excl. in [place]? --     Constitutional: Alert and oriented.  Eyes: Conjunctivae are normal.  Head: Atraumatic. Mouth/Throat: Patient is wearing a mask. Neck: No stridor.  No meningeal signs.   Cardiovascular: Normal rate, regular rhythm. Good peripheral circulation. Grossly normal heart sounds. Respiratory: Normal respiratory effort.  No retractions. Gastrointestinal: Soft and nontender. No distention.  Musculoskeletal: No lower extremity tenderness nor edema. No gross deformities of extremities. Neurologic:  Normal speech and language. No gross focal neurologic deficits are appreciated.  Skin:  Skin is warm, dry and intact. Psychiatric: Mood and affect are normal. Speech and behavior are normal.  __________  Procedures   ____________________________________________   INITIAL IMPRESSION / MDM / ASSESSMENT AND PLAN / ED COURSE  As part of my medical decision making, I reviewed the following data within the electronic MEDICAL RECORD NUMBER   29 year old male presented with above-stated history and physical exam without any acute psychosis homicidal or suicidal ideation.  Awaiting psychiatry evaluation     ____________________________________________  FINAL CLINICAL IMPRESSION(S) / ED DIAGNOSES  Final diagnoses:  Schizophrenia, unspecified type ([place])     MEDICATIONS GIVEN DURING THIS VISIT:  Medications - No data to display   ED Discharge Orders    None      *Please note:  Kenneth Jensen was evaluated in Emergency Department on 04/14/2019 for the symptoms described in the history of present illness. He was evaluated in the context of the global COVID-19 pandemic, which necessitated consideration that the patient might be at risk for infection with the SARS-CoV-2 virus that causes COVID-19. Institutional protocols and algorithms that pertain to the evaluation of  patients at risk for COVID-19 are in a state of rapid change based on information released by regulatory bodies including the CDC and federal and state organizations. These policies and algorithms were followed during the patient's care in the ED.  Some ED evaluations and interventions may be delayed as a result of limited staffing during the pandemic.*  Note:  This document was prepared using Dragon voice recognition software and may include unintentional dictation errors.   Gregor Hams, MD 04/14/19 [phone]

## 2019-04-14 NOTE — BH Assessment (Signed)
This Clinical research associate contacted [facility] [phone]) and left a message with ACTT staff Jackie Plum to inform ACTT team lead Daniel Nones that patient does not meet criteria for Inpatient and will be discharged in the morning. Per ACTT team staff Jackie Plum "try to contact his family to come get him, if not we can try to figure something out in the morning, but we did not advise that he come to the ED."

## 2019-04-14 NOTE — ED Notes (Signed)
Pt. Introduced to unit.  Pt. Has been to the unit in the past and recognized this nurse.  Pt. Only request was for additional blankets, pt. Given additional blanket.  Pt. Calm and cooperative.

## 2019-04-14 NOTE — Discharge Instructions (Signed)
Please follow up with your ACT team for continued support.  Continue taking all of your medications as prescribed.

## 2019-04-14 NOTE — TOC Transition Note (Signed)
Transition of Care [facility]) - CM/SW Discharge Note   Patient Details  Name: Kenneth Jensen MRN: [id number] Date of Birth: Jul 03, 1990  Transition of Care [facility]) CM/SW Contact:  Marina Goodell Phone Number:  [phone] 04/14/2019, 12:31 PM   Clinical Narrative:    Patient has been psychiatrically cleared.  This CSW contacted by ED Nurse about patient discharging home. Received call from [facility] from [phone], [facility] and stated she abel to pick up the patient but wanted to make sure the patient had a key to get in the house.  This CSW contacted the BHU to find out if the patient has a key, Amy RN, BHU/ED stated the patient did not have a key but stated "my little cousins are there."  This CSW contacted Ms. Clark and she stated she could pick up the patient at 12:30 and take him home.  This CSW contacted EDP and ED Nurse via IM and let them know about pick up time.         Patient Goals and CMS Choice        Discharge Placement                       Discharge Plan and Services                                     Social Determinants of Health (SDOH) Interventions     Readmission Risk Interventions No flowsheet data found.

## 2019-04-14 NOTE — ED Provider Notes (Signed)
Procedures     ----------------------------------------- 12:23 PM on 04/14/2019 -----------------------------------------   Discussed with psychiatry and social work.  Prior psychiatry consult note reviewed.  Patient has no acute medical or psychiatric issues, stable for discharge.  Act team has been contacted by social work.  Patient has a ride who is coming to pick him up.  I have reissued his home medication prescriptions to ensure access to his medications.   Sharman Cheek, MD 04/14/19 1224

## 2019-05-03 ENCOUNTER — Ambulatory Visit: Payer: Medicare HMO | Attending: Internal Medicine

## 2019-05-03 DIAGNOSIS — Z23 Encounter for immunization: Secondary | ICD-10-CM

## 2019-05-03 NOTE — Progress Notes (Signed)
   Covid-19 Vaccination Clinic  Name:  Kenneth Jensen    MRN: [id number] DOB: 1990-04-19  05/03/2019  Mr. Kenneth Jensen was observed post Covid-19 immunization for 15 minutes without incident. He was provided with Vaccine Information Sheet and instruction to access the V-Safe system.   Mr. Kenneth Jensen was instructed to call 911 with any severe reactions post vaccine: Marland Kitchen Difficulty breathing  . Swelling of face and throat  . A fast heartbeat  . A bad rash all over body  . Dizziness and weakness   Immunizations Administered    Name Date Dose VIS Date Route   Pfizer COVID-19 Vaccine 05/03/2019 11:26 AM 0.3 mL 03/02/2018 Intramuscular   Manufacturer: [employer], [employer]   Lot: [id number]   NDC: [address]-3

## 2019-05-19 ENCOUNTER — Encounter: Payer: Self-pay | Admitting: Emergency Medicine

## 2019-05-19 ENCOUNTER — Emergency Department
Admission: EM | Admit: 2019-05-19 | Discharge: 2019-05-20 | Disposition: A | Discharge status: Home / Self Care | Payer: Medicare HMO | Attending: Emergency Medicine | Admitting: Emergency Medicine

## 2019-05-19 ENCOUNTER — Other Ambulatory Visit: Payer: Self-pay

## 2019-05-19 DIAGNOSIS — F201 Disorganized schizophrenia: Secondary | ICD-10-CM | POA: Diagnosis present

## 2019-05-19 DIAGNOSIS — F29 Unspecified psychosis not due to a substance or known physiological condition: Secondary | ICD-10-CM | POA: Diagnosis not present

## 2019-05-19 DIAGNOSIS — Z79899 Other long term (current) drug therapy: Secondary | ICD-10-CM | POA: Insufficient documentation

## 2019-05-19 DIAGNOSIS — J45909 Unspecified asthma, uncomplicated: Secondary | ICD-10-CM | POA: Insufficient documentation

## 2019-05-19 DIAGNOSIS — F319 Bipolar disorder, unspecified: Secondary | ICD-10-CM | POA: Diagnosis not present

## 2019-05-19 DIAGNOSIS — Z599 Problem related to housing and economic circumstances, unspecified: Secondary | ICD-10-CM | POA: Diagnosis not present

## 2019-05-19 DIAGNOSIS — F209 Schizophrenia, unspecified: Secondary | ICD-10-CM | POA: Diagnosis not present

## 2019-05-19 DIAGNOSIS — Z1589 Genetic susceptibility to other disease: Secondary | ICD-10-CM

## 2019-05-19 DIAGNOSIS — F250 Schizoaffective disorder, bipolar type: Secondary | ICD-10-CM | POA: Diagnosis present

## 2019-05-19 DIAGNOSIS — Z5989 Other problems related to housing and economic circumstances: Secondary | ICD-10-CM

## 2019-05-19 DIAGNOSIS — F32A Depression, unspecified: Secondary | ICD-10-CM | POA: Diagnosis present

## 2019-05-19 DIAGNOSIS — F200 Paranoid schizophrenia: Secondary | ICD-10-CM | POA: Diagnosis present

## 2019-05-19 DIAGNOSIS — F17210 Nicotine dependence, cigarettes, uncomplicated: Secondary | ICD-10-CM | POA: Diagnosis not present

## 2019-05-19 LAB — COMPREHENSIVE METABOLIC PANEL
ALT: 15 U/L (ref 0–44)
AST: 21 U/L (ref 15–41)
Albumin: 4.4 g/dL (ref 3.5–5.0)
Alkaline Phosphatase: 55 U/L (ref 38–126)
Anion gap: 9 (ref 5–15)
BUN: 11 mg/dL (ref 6–20)
CO2: 26 mmol/L (ref 22–32)
Calcium: 8.9 mg/dL (ref 8.9–10.3)
Chloride: 104 mmol/L (ref 98–111)
Creatinine, Ser: 0.9 mg/dL (ref 0.61–1.24)
GFR calc Af Amer: >60 mL/min (ref >60)
GFR calc non Af Amer: >60 mL/min (ref >60)
Glucose, Bld: 93 mg/dL (ref 70–99)
Potassium: 3.8 mmol/L (ref 3.5–5.1)
Sodium: 139 mmol/L (ref 135–145)
Total Bilirubin: 0.6 mg/dL (ref 0.3–1.2)
Total Protein: 8 g/dL (ref 6.5–8.1)

## 2019-05-19 LAB — CBC
HCT: 41.3 % (ref 39.0–52.0)
Hemoglobin: 14.5 g/dL (ref 13.0–17.0)
MCH: 31.3 pg (ref 26.0–34.0)
MCHC: 35.1 g/dL (ref 30.0–36.0)
MCV: 89.2 fL (ref 80.0–100.0)
Platelets: 325 10*3/uL (ref 150–400)
RBC: 4.63 MIL/uL (ref 4.22–5.81)
RDW: 13.2 % (ref 11.5–15.5)
WBC: 10 10*3/uL (ref 4.0–10.5)
nRBC: 0 % (ref 0.0–0.2)

## 2019-05-19 LAB — URINE DRUG SCREEN, QUALITATIVE (ARMC ONLY)
Amphetamines, Ur Screen: NOT DETECTED
Barbiturates, Ur Screen: NOT DETECTED
Benzodiazepine, Ur Scrn: NOT DETECTED
Cannabinoid 50 Ng, Ur ~~LOC~~: POSITIVE — AB
Cocaine Metabolite,Ur ~~LOC~~: POSITIVE — AB
MDMA (Ecstasy)Ur Screen: NOT DETECTED
Methadone Scn, Ur: NOT DETECTED
Opiate, Ur Screen: NOT DETECTED
Phencyclidine (PCP) Ur S: NOT DETECTED
Tricyclic, Ur Screen: NOT DETECTED

## 2019-05-19 LAB — SALICYLATE LEVEL: Salicylate Lvl: <7 mg/dL — ABNORMAL LOW (ref 7.0–30.0)

## 2019-05-19 LAB — ETHANOL: Alcohol, Ethyl (B): <10 mg/dL (ref <10)

## 2019-05-19 LAB — ACETAMINOPHEN LEVEL: Acetaminophen (Tylenol), Serum: <10 ug/mL — ABNORMAL LOW (ref 10–30)

## 2019-05-19 NOTE — ED Notes (Addendum)
Pt dressed out in wine scrubs with this ED Tech and ED Tech Jeb. Pt belongings: jeans, belt, sneakers, phone, wallet, boxer shorts, shorts, red shirt, and socks.

## 2019-05-19 NOTE — ED Notes (Signed)
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
## 2019-05-20 DIAGNOSIS — F209 Schizophrenia, unspecified: Secondary | ICD-10-CM | POA: Diagnosis not present
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
## 2019-05-20 NOTE — ED Notes (Signed)
VOL/Consult completed/ Pending D/C

## 2019-05-20 NOTE — ED Notes (Signed)
Discharge teaching and follow-up care done with pt. He verbalized understanding. Signed paper discharge form. Personal belongings given back to him. He was escorted to the lobby, ambulatory and in NAD.

## 2019-05-20 NOTE — TOC Transition Note (Signed)
Transition of Care [facility]) - CM/SW Discharge Note   Patient Details  Name: Kenneth Jensen MRN: [id number] Date of Birth: March 29, 1990  [facility]) CM/SW Contact:  Marina Goodell Phone Number: [phone] 05/20/2019, 2:54 PM   Clinical Narrative:     Patient psychiatrically cleared. Patient's cousin Terance Ice [phone] will pick up patient today, 05/20/2019, between 4-5PM.  This CSW updated EDP and ED Nurse, will need discharge summary and hard copies of new scripts.        Patient Goals and CMS Choice        Discharge Placement                       Discharge Plan and Services                                     Social Determinants of Health (SDOH) Interventions     Readmission Risk Interventions No flowsheet data found.

## 2019-05-20 NOTE — Discharge Instructions (Addendum)
Continue your current medication regimen and follow-up with your ACT team and outpatient providers.
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
## 2019-05-20 NOTE — ED Notes (Signed)
Given meal tray.

## 2019-11-04 ENCOUNTER — Other Ambulatory Visit: Payer: Self-pay

## 2019-11-04 ENCOUNTER — Encounter: Payer: Self-pay | Admitting: Emergency Medicine

## 2019-11-04 DIAGNOSIS — F250 Schizoaffective disorder, bipolar type: Secondary | ICD-10-CM | POA: Diagnosis not present

## 2019-11-04 DIAGNOSIS — Z20822 Contact with and (suspected) exposure to covid-19: Secondary | ICD-10-CM | POA: Diagnosis not present

## 2019-11-04 DIAGNOSIS — F319 Bipolar disorder, unspecified: Secondary | ICD-10-CM | POA: Diagnosis not present

## 2019-11-04 DIAGNOSIS — F17200 Nicotine dependence, unspecified, uncomplicated: Secondary | ICD-10-CM | POA: Insufficient documentation

## 2019-11-04 DIAGNOSIS — J45909 Unspecified asthma, uncomplicated: Secondary | ICD-10-CM | POA: Diagnosis not present

## 2019-11-04 DIAGNOSIS — R440 Auditory hallucinations: Secondary | ICD-10-CM | POA: Diagnosis not present

## 2019-11-04 LAB — COMPREHENSIVE METABOLIC PANEL
ALT: 15 U/L (ref 0–44)
AST: 19 U/L (ref 15–41)
Albumin: 4.6 g/dL (ref 3.5–5.0)
Alkaline Phosphatase: 57 U/L (ref 38–126)
Anion gap: 9 (ref 5–15)
BUN: 12 mg/dL (ref 6–20)
CO2: 26 mmol/L (ref 22–32)
Calcium: 8.9 mg/dL (ref 8.9–10.3)
Chloride: 103 mmol/L (ref 98–111)
Creatinine, Ser: 0.8 mg/dL (ref 0.61–1.24)
GFR, Estimated: >60 mL/min (ref >60)
Glucose, Bld: 81 mg/dL (ref 70–99)
Potassium: 3.7 mmol/L (ref 3.5–5.1)
Sodium: 138 mmol/L (ref 135–145)
Total Bilirubin: 0.6 mg/dL (ref 0.3–1.2)
Total Protein: 7.6 g/dL (ref 6.5–8.1)

## 2019-11-04 LAB — URINE DRUG SCREEN, QUALITATIVE (ARMC ONLY)
Amphetamines, Ur Screen: NOT DETECTED
Barbiturates, Ur Screen: NOT DETECTED
Benzodiazepine, Ur Scrn: NOT DETECTED
Cannabinoid 50 Ng, Ur ~~LOC~~: POSITIVE — AB
Cocaine Metabolite,Ur ~~LOC~~: NOT DETECTED
MDMA (Ecstasy)Ur Screen: NOT DETECTED
Methadone Scn, Ur: NOT DETECTED
Opiate, Ur Screen: NOT DETECTED
Phencyclidine (PCP) Ur S: NOT DETECTED
Tricyclic, Ur Screen: NOT DETECTED

## 2019-11-04 LAB — SALICYLATE LEVEL: Salicylate Lvl: <7 mg/dL — ABNORMAL LOW (ref 7.0–30.0)

## 2019-11-04 LAB — ACETAMINOPHEN LEVEL: Acetaminophen (Tylenol), Serum: <10 ug/mL — ABNORMAL LOW (ref 10–30)

## 2019-11-04 LAB — CBC
HCT: 41 % (ref 39.0–52.0)
Hemoglobin: 14.1 g/dL (ref 13.0–17.0)
MCH: 31 pg (ref 26.0–34.0)
MCHC: 34.4 g/dL (ref 30.0–36.0)
MCV: 90.1 fL (ref 80.0–100.0)
Platelets: 332 10*3/uL (ref 150–400)
RBC: 4.55 MIL/uL (ref 4.22–5.81)
RDW: 12.9 % (ref 11.5–15.5)
WBC: 12 10*3/uL — ABNORMAL HIGH (ref 4.0–10.5)
nRBC: 0 % (ref 0.0–0.2)

## 2019-11-04 LAB — ETHANOL: Alcohol, Ethyl (B): <10 mg/dL (ref <10)

## 2019-11-04 NOTE — ED Notes (Addendum)
Grey orange and purple tennis shoes Black and grey socks Light wash jeans Light green t-shirt Red underwear Cell phone Cell phone charger [employer] Black belt Black Back pack Navy blue zippered jacket

## 2019-11-04 NOTE — ED Triage Notes (Signed)
Patient states that he has been hearing voices. Patient states that he has a history of schizophrenia and has been taking his medications. Patient denies SI at this time.

## 2019-11-05 ENCOUNTER — Emergency Department
Admission: EM | Admit: 2019-11-05 | Discharge: 2019-11-05 | Disposition: A | Discharge status: Home / Self Care | Payer: Medicare HMO | Attending: Emergency Medicine | Admitting: Emergency Medicine

## 2019-11-05 DIAGNOSIS — R440 Auditory hallucinations: Secondary | ICD-10-CM

## 2019-11-05 DIAGNOSIS — F250 Schizoaffective disorder, bipolar type: Secondary | ICD-10-CM

## 2019-11-05 LAB — RESPIRATORY PANEL BY RT PCR (FLU A&B, COVID)
Influenza A by PCR: NEGATIVE
Influenza B by PCR: NEGATIVE
SARS Coronavirus 2 by RT PCR: NEGATIVE

## 2019-11-05 LAB — VALPROIC ACID LEVEL: Valproic Acid Lvl: 70 ug/mL (ref 50.0–100.0)

## 2019-11-05 MED ORDER — NICOTINE 21 MG/24HR TD PT24
21.0000 mg | MEDICATED_PATCH | Freq: Every day | TRANSDERMAL | Status: DC
  Administered 2019-11-05: 21 mg via TRANSDERMAL
  Filled 2019-11-05: qty 1

## 2019-11-05 NOTE — ED Notes (Signed)
Hourly rounding completed at this time, patient currently asleep in room. No complaints, stable, and in no acute distress. Q15 minute rounds and monitoring via Rover and Officer to continue. 

## 2019-11-05 NOTE — Discharge Instructions (Addendum)
You have been seen in the emergency department for a  psychiatric concern. You have been evaluated both medically as well as psychiatrically. Please follow-up with your outpatient resources provided. Return to the emergency department for any worsening symptoms, or any thoughts of hurting yourself or anyone else so that we may attempt to help you. 

## 2019-11-05 NOTE — BH Assessment (Signed)
Assessment Note  Kenneth Jensen is an 29 y.o. male. Per triage note: Patient states that he has been hearing voices. Patient states that he has a history of schizophrenia and has been taking his medications. Patient denies SI at this time.   Pt is noted to be resting upon this writer's arrival. Pt is calm and cooperative throughout the interview. Patient is dressed in scrubs and has an unremarkable appearance. When asked what brought him to the hospital the Pt states "I got overwhelmed so I called the police". Pt had slurred, slow speech with good eye contact. Pt's mood was sullen, and his affect was flat.  Pt's thought content was relevant/coherent. Pt admitted to cannabis use and occasional drinking. The patient explained that he is not currently employed but has an interview at [employer]. The patient reported having poor sleep. Expressed concerns about his psych meds stating, "I need them." Pt reports that he hears voices that tell him "You ain't gone never be nothing" or "You always going to be at the bottom". The patient denies current SI, HI, or symptoms of paranoia.  Diagnosis: Schizophrenia  Past Medical History:  Past Medical History:  Diagnosis Date  . Asthma   . Bipolar 1 disorder (HCC)   . Depression   . Schizophrenia (HCC)   . Schizotaxia     History reviewed. No pertinent surgical history.  Family History: No family history on file.  Social History:  reports that he has been smoking. He has never used smokeless tobacco. He reports current alcohol use. He reports current drug use. Drug: Marijuana.  Additional Social History:  Alcohol / Drug Use Pain Medications: See PTA Prescriptions: See PTA History of alcohol / drug use?: Yes Negative Consequences of Use: Personal relationships Substance #1 Name of Substance 1: Marijuana  CIWA: CIWA-Ar BP: 139/77 Pulse Rate: 72 COWS:    Allergies:  Allergies  Allergen Reactions  . Haldol [Haloperidol]     Pt  states "I went crazy"  . Percocet [Oxycodone-Acetaminophen]   . Vicodin [Hydrocodone-Acetaminophen] Itching  . Vicodin [Hydrocodone-Acetaminophen]     Home Medications: (Not in a hospital admission)   OB/GYN Status:  No LMP for male patient.  General Assessment Data Location of Assessment: [facility]rology TTS Assessment: In system Is this a Tele or Face-to-Face Assessment?: Face-to-Face Is this an Initial Assessment or a Re-assessment for this encounter?: Initial Assessment Patient Accompanied by:: N/A Language Other than English: No Living Arrangements: Other (Comment) What gender do you identify as?: Male Date Telepsych consult ordered in [facility]: 11/05/19 Time Telepsych consult ordered in [facility]: 0228 Marital status: Single Maiden name: n/a Pregnancy Status: No Living Arrangements: Other relatives Can pt return to current living arrangement?: Yes Admission Status: Voluntary Is patient capable of signing voluntary admission?: Yes Referral Source: Other Insurance type: [employer] Medicare  Medical Screening Exam [facility] Walk-in ONLY) Medical Exam completed: Yes  Crisis Care Plan Living Arrangements: Other relatives Legal Guardian: Other: (Self) Name of Psychiatrist: [facility] [phone] Name of Therapist: [facility]  Education Status Is patient currently in school?: No Is the patient employed, unemployed or receiving disability?: Unemployed  Risk to self with the past 6 months Suicidal Ideation: No Has patient been a risk to self within the past 6 months prior to admission? : No Suicidal Intent: No Has patient had any suicidal intent within the past 6 months prior to admission? : No Suicidal Plan?: No Has patient had any suicidal plan within the past 6 months  prior to admission? : No Access to Means: No What has been your use of drugs/alcohol within the last 12 months?: Marijuana Previous Attempts/Gestures: No How many times?: 0 Other Self Harm Risks: None Triggers  for Past Attempts: None known Intentional Self Injurious Behavior: None Family Suicide History: Yes, Unknown Recent stressful life event(s): Conflict (Comment) Persecutory voices/beliefs?: No Depression: Yes Depression Symptoms: Feeling worthless/self pity Substance abuse history and/or treatment for substance abuse?: No Suicide prevention information given to non-admitted patients: Not applicable  Risk to Others within the past 6 months Homicidal Ideation: No Does patient have any lifetime risk of violence toward others beyond the six months prior to admission? : No Thoughts of Harm to Others: No Current Homicidal Intent: No Current Homicidal Plan: No Access to Homicidal Means: No Identified Victim: n/a History of harm to others?: No Assessment of Violence: None Noted Violent Behavior Description: n/a Does patient have access to weapons?: No Criminal Charges Pending?: No Does patient have a court date: No Is patient on probation?: No  Psychosis Hallucinations: None noted Delusions: None noted  Mental Status Report Appearance/Hygiene: In scrubs Eye Contact: Poor Motor Activity: Freedom of movement Speech: Slurred, Slow Level of Consciousness: Drowsy Mood: Sullen Affect: Appropriate to circumstance Anxiety Level: Minimal Thought Processes: Coherent, Relevant Judgement: Unimpaired Orientation: Person, Place, Time, Situation Obsessive Compulsive Thoughts/Behaviors: Unable to Assess  Cognitive Functioning Concentration: Normal Memory: Recent Intact, Remote Intact Is patient IDD: No Insight: Fair Impulse Control: Good Appetite: Fair Have you had any weight changes? : No Change Sleep: No Change Total Hours of Sleep:  (uta) Vegetative Symptoms: Unable to Assess  ADLScreening [facility] Services) Patient's cognitive ability adequate to safely complete daily activities?: Yes Patient able to express need for assistance with ADLs?: Yes Independently performs  ADLs?: Yes (appropriate for developmental age)  Prior Inpatient Therapy Prior Inpatient Therapy: Yes Prior Therapy Dates: 03/2019, 08/2018 Prior Therapy Facilty/Provider(s): [facility] Reason for Treatment: Schizophrenia  Prior Outpatient Therapy Prior Outpatient Therapy: Yes Prior Therapy Dates:  (UTA) Prior Therapy Facilty/Provider(s): [facility] Reason for Treatment: Schizophrenia Does patient have an ACCT team?: Yes ([facility]) Does patient have Intensive In-House Services?  : No Does patient have [facility] services? : No Does patient have P4CC services?: No  ADL Screening (condition at time of admission) Patient's cognitive ability adequate to safely complete daily activities?: Yes Is the patient deaf or have difficulty hearing?: No Does the patient have difficulty seeing, even when wearing glasses/contacts?: No Does the patient have difficulty concentrating, remembering, or making decisions?: No Patient able to express need for assistance with ADLs?: Yes Does the patient have difficulty dressing or bathing?: No Independently performs ADLs?: Yes (appropriate for developmental age) Does the patient have difficulty walking or climbing stairs?: No Weakness of Legs: None Weakness of Arms/Hands: None  Home Assistive Devices/Equipment Home Assistive Devices/Equipment: None  Therapy Consults (therapy consults require a physician order) PT Evaluation Needed: No OT Evalulation Needed: No SLP Evaluation Needed: No       Advance Directives (For Healthcare) Does Patient Have a Medical Advance Directive?: No          Disposition: Per psych NP pt is recommended for overnight observation and reassessment in the AM.  Disposition Initial Assessment Completed for this Encounter: Yes  On Site Evaluation by:   Reviewed with Physician:    Foy Guadalajara 11/05/2019 4:46 AM

## 2019-11-05 NOTE — ED Notes (Signed)
Patient transferred from Triage to room 23 after dressing out and screening for contraband. Report received from [place], [place] including situation, background, assessment and recommendations. Pt oriented to [employer] including Q15 minute rounds as well as Psychologist, counselling for their protection. Patient is alert and oriented, warm and dry in no acute distress. Patient denies SI, HI, and AH. Pt. Encouraged to let this nurse know if needs arise.

## 2019-11-05 NOTE — ED Notes (Signed)
Hourly rounding completed at this time, patient currently awake in room. No complaints, stable, and in no acute distress. Q15 minute rounds and monitoring via Rover and Officer to continue. °

## 2019-11-05 NOTE — ED Notes (Signed)
Patient and His cousin voices understanding of discharge instructions, all belongings given back to Patient, Patient is calm, and cooperative, His cousin is going to transport him home.

## 2019-11-05 NOTE — Consult Note (Signed)
[facility] Psych ED Discharge  11/05/2019 11:34 AM Kenneth Jensen  MRN:  [id number] Principal Problem: Schizoaffective disorder, bipolar type Discharge Diagnoses:Adjustment disorder with mixed disturbance of emotions and conduct  Subjective: "My cousin was not home."  Patient seen and evaluated in person by this provider and TTS.  His cousin who he is living with, called and stated she told him to go to a neighbor's home until she returned to their house.  Kenneth Jensen came to the hospital instead.  No suicidal/homicidal ideations, hallucinations, or substance use.  He has [facility] to provide his medications and on a long-term antipsychotic injectable.  Psychiatrically cleared for discharge.  Total Time spent with patient: 45 minutes  Past Psychiatric History: schizoaffective d/o, bipolar type  Past Medical History:  Past Medical History:  Diagnosis Date  . Asthma   . Bipolar 1 disorder (HCC)   . Depression   . Schizophrenia (HCC)   . Schizotaxia    History reviewed. No pertinent surgical history. Family History: No family history on file. Family Psychiatric  History: none Social History:  Social History   Substance and Sexual Activity  Alcohol Use Yes   Comment: social     Social History   Substance and Sexual Activity  Drug Use Yes  . Types: Marijuana    Social History   Socioeconomic History  . Marital status: Single    Spouse name: Not on file  . Number of children: Not on file  . Years of education: Not on file  . Highest education level: Not on file  Occupational History  . Not on file  Tobacco Use  . Smoking status: Current Every Day Smoker  . Smokeless tobacco: Never Used  Substance and Sexual Activity  . Alcohol use: Yes    Comment: social  . Drug use: Yes    Types: Marijuana  . Sexual activity: Not on file  Other Topics Concern  . Not on file  Social History Narrative   ** Merged History Encounter **       Social Determinants of Health   Financial  Resource Strain:   . Difficulty of Paying Living Expenses: Not on file  Food Insecurity:   . Worried About Programme researcher, broadcasting/film/video in the Last Year: Not on file  . Ran Out of Food in the Last Year: Not on file  Transportation Needs:   . Lack of Transportation (Medical): Not on file  . Lack of Transportation (Non-Medical): Not on file  Physical Activity:   . Days of Exercise per Week: Not on file  . Minutes of Exercise per Session: Not on file  Stress:   . Feeling of Stress : Not on file  Social Connections:   . Frequency of Communication with Friends and Family: Not on file  . Frequency of Social Gatherings with Friends and Family: Not on file  . Attends Religious Services: Not on file  . Active Member of Clubs or Organizations: Not on file  . Attends Banker Meetings: Not on file  . Marital Status: Not on file    Has this patient used any form of tobacco in the last 30 days? (Cigarettes, Smokeless Tobacco, Cigars, and/or Pipes) A prescription for an FDA-approved tobacco cessation medication was offered at discharge and the patient refused  Current Medications: Current Facility-Administered Medications  Medication Dose Route Frequency Provider Last Rate Last Admin  . nicotine (NICODERM CQ - dosed in mg/24 hours) patch 21 mg  21 mg Transdermal Daily Minna Antis, MD  21 mg at 11/05/19 5643   Current Outpatient Medications  Medication Sig Dispense Refill  . divalproex (DEPAKOTE ER) 500 MG 24 hr tablet Take 1 tablet (500 mg total) by mouth 3 (three) times daily. 90 tablet 0  . paliperidone (INVEGA SUSTENNA) 234 MG/1.5ML SUSY injection Inject 234 mg into the muscle every 30 (thirty) days.    Marland Kitchen QUEtiapine (SEROQUEL) 200 MG tablet Take 1 tablet (200 mg total) by mouth at bedtime. 30 tablet 0  . traZODone (DESYREL) 50 MG tablet Take 1 tablet (50 mg total) by mouth at bedtime as needed for sleep. 30 tablet 0   PTA Medications: (Not in a hospital  admission)   Musculoskeletal: Strength & Muscle Tone: within normal limits Gait & Station: normal Patient leans: N/A  Psychiatric Specialty Exam: Physical Exam Vitals and nursing note reviewed.  Constitutional:      Appearance: Normal appearance.  HENT:     Head: Normocephalic.     Nose: Nose normal.  Pulmonary:     Effort: Pulmonary effort is normal.  Musculoskeletal:        General: Normal range of motion.     Cervical back: Normal range of motion.  Neurological:     General: No focal deficit present.     Mental Status: He is alert and oriented to person, place, and time.  Psychiatric:        Attention and Perception: Attention and perception normal.        Mood and Affect: Mood and affect normal.        Speech: Speech normal.        Behavior: Behavior normal. Behavior is cooperative.        Thought Content: Thought content normal.        Cognition and Memory: Cognition and memory normal.        Judgment: Judgment normal.     Review of Systems  All other systems reviewed and are negative.   Blood pressure (!) 112/52, pulse 63, temperature 98.3 F (36.8 C), temperature source Oral, resp. rate 18, SpO2 100 %.There is no height or weight on file to calculate BMI.  General Appearance: Casual  Eye Contact:  Good  Speech:  Normal Rate  Volume:  Normal  Mood:  Euthymic  Affect:  Congruent  Thought Process:  Coherent and Descriptions of Associations: Intact  Orientation:  Full (Time, Place, and Person)  Thought Content:  WDL and Logical  Suicidal Thoughts:  No  Homicidal Thoughts:  No  Memory:  Immediate;   Good Recent;   Good Remote;   Good  Judgement:  Fair  Insight:  Fair  Psychomotor Activity:  Normal  Concentration:  Concentration: Good and Attention Span: Good  Recall:  Good  Fund of Knowledge:  Fair  Language:  Good  Akathisia:  No  Handed:  Right  AIMS (if indicated):     Assets:  Housing Leisure Time Physical Health Resilience Social Support   ADL's:  Intact  Cognition:  WNL  Sleep:        Demographic Factors:  Male and Adolescent or young adult  Loss Factors: NA  Historical Factors: Impulsivity  Risk Reduction Factors:   Sense of responsibility to family, Living with another person, especially a relative, Positive social support and Positive therapeutic relationship  Continued Clinical Symptoms:  None  Cognitive Features That Contribute To Risk:  None    Suicide Risk:  Minimal: No identifiable suicidal ideation.  Patients presenting with no risk factors but with morbid  ruminations; may be classified as minimal risk based on the severity of the depressive symptoms    Plan Of Care/Follow-up recommendations:  Schizoaffective disorder, bipolar type: -continue Depakote 500  Mg TID -Continue Invega Sustenna injectable monthly -Continue Seroquel 200 mg daily  Insomnia: -Continue Trazodone 50  Mg at bedtime PRN Activity:  as tolerated Diet:  heart healthy diet  Disposition: discharge home to his cousin Nanine Means, NP 11/05/2019, 11:34 AM

## 2019-11-05 NOTE — ED Notes (Signed)
Pt laying in bed at this time, states he wants to get sleep. Pt reports visual hallucination to this nurse, states he sees demons, when asked if he saw any now he states he saw one when the door was opened for this nurse to enter room. (Pt was asleep when nurse entered room) Pt denies SI, HI, AH.

## 2019-11-05 NOTE — ED Notes (Signed)
Lab contacted at this time for add-on blood test

## 2019-11-05 NOTE — ED Notes (Signed)
Patient is calm and cooperative, He ask nurse to call His cousin to let her know where He was at and that He was here to get His medication straight, Nurse did call Enrique Sack- [phone]) she states that He was upset because she was not home and she had ask Him to walk to His friends house and that He was perfectly fine and that she would come and pick him up as soon as He is discharged, nurse did inform the Doctor. Nurse will continue to monitor, camera surveillance in progress for safety.

## 2019-11-05 NOTE — BH Assessment (Signed)
Writer spoke with the patient to complete an updated/reassessment. Patient denies SI/HI and AV/H.  Spoke with patient's cousin ([phone]), she shared why the patient came to the ER. He went to her house, when she wasn't home, he didn't want to go back to the house he was at because it was cold. Thus, he came to the ER. She voiced no concerns or problems.

## 2019-11-05 NOTE — ED Notes (Signed)
Hourly rounding completed at this time, patient currently asleep in hallway recliner. No complaints, stable, and in no acute distress. Q15 minute rounds and monitoring via Rover and Officer to continue. 

## 2019-11-05 NOTE — BH Assessment (Signed)
This writer attempted to contact [facility] [phone]) however there was no answer. There was no option to leave a message. Follow up needed.

## 2019-11-05 NOTE — ED Provider Notes (Signed)
[facility] Emergency Department Provider Note  ____________________________________________   First MD Initiated Contact with Patient 11/05/19 0201     (approximate)  I have reviewed the triage vital signs and the nursing notes.   HISTORY  Chief Complaint Psychiatric Evaluation  Level 5 caveat:  history/ROS limited by active psychosis / mental illness / altered mental status   HPI Kenneth DINGLEY is a 29 y.o. male with history that includes schizophrenia and bipolar disorder who presents voluntary for evaluation of auditory hallucinations.  He is minimally cooperative or forthcoming with the history.  He says that today he heard voices saying that he was not worth anything or that he would never become anything.  However he also says that he hears voices a lot but there is something worse about the voices today.  He denies suicidal ideation or homicidal ideation.  He says he has no pain and will not answer any other questions.         Past Medical History:  Diagnosis Date  . Asthma   . Bipolar 1 disorder (HCC)   . Depression   . Schizophrenia (HCC)   . Schizotaxia     Patient Active Problem List   Diagnosis Date Noted  . Schizophrenia, unspecified (HCC) 03/25/2019  . 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 Bipolar 1 disorder (HCC) 03/25/2019  . Depression 03/25/2019  . Schizoaffective disorder, bipolar type (HCC) 03/25/2019  . Schizophrenia (HCC) 08/02/2018    History reviewed. No pertinent surgical history.  Prior to Admission medications   Medication Sig Start Date End Date Taking? Authorizing Provider  divalproex (DEPAKOTE ER) 500 MG 24 hr tablet Take 1 tablet (500 mg total) by mouth 3 (three) times daily. 04/14/19   Sharman Cheek, MD  paliperidone (INVEGA SUSTENNA) 234 MG/1.5ML SUSY injection Inject 234 mg into the muscle every 30 (thirty) days.    [provider]  QUEtiapine (SEROQUEL) 200 MG tablet Take 1 tablet (200 mg total) by  mouth at bedtime. 04/14/19   Sharman Cheek, MD  traZODone (DESYREL) 50 MG tablet Take 1 tablet (50 mg total) by mouth at bedtime as needed for sleep. 04/14/19   Sharman Cheek, MD    Allergies Haldol [haloperidol], Percocet [oxycodone-acetaminophen], Vicodin [hydrocodone-acetaminophen], and Vicodin [hydrocodone-acetaminophen]  No family history on file.  Social History Social History   Tobacco Use  . Smoking status: Current Every Day Smoker  . Smokeless tobacco: Never Used  Substance Use Topics  . Alcohol use: Yes    Comment: social  . Drug use: Yes    Types: Marijuana    Review of Systems Level 5 caveat:  history/ROS limited by active psychosis / mental illness / altered mental status   ____________________________________________   PHYSICAL EXAM:  VITAL SIGNS: ED Triage Vitals [11/04/19 2206]  Enc Vitals Group     BP 139/77     Pulse Rate 72     Resp 18     Temp 98.7 F (37.1 C)     Temp Source Oral     SpO2 99 %     Weight      Height      Head Circumference      Peak Flow      Pain Score 0     Pain Loc      Pain Edu?      Excl. in GC?     Constitutional: Alert and oriented. Eyes: Conjunctivae are normal.  Head: Atraumatic. Nose: No congestion/rhinnorhea. Mouth/Throat: Patient is wearing a mask.  Neck: No stridor.  No meningeal signs.   Cardiovascular: Normal rate, regular rhythm. Good peripheral circulation. Grossly normal heart sounds. Respiratory: Normal respiratory effort.  No retractions. Gastrointestinal: Soft and nontender. No distention.  Musculoskeletal: No lower extremity tenderness nor edema. No gross deformities of extremities. Neurologic:  Normal speech and language. No gross focal neurologic deficits are appreciated.  Skin:  Skin is warm, dry and intact. Psychiatric: Mood and affect are withdrawn but not dangerous.  Minimally communicative but it is at night and he seems to just want to sleep.  Endorses auditory hallucinations without  commands.  Denies SI/HI.  ____________________________________________   LABS (all labs ordered are listed, but only abnormal results are displayed)  Labs Reviewed  SALICYLATE LEVEL - Abnormal; Notable for the following components:      Result Value   Salicylate Lvl <7.0 (*)    All other components within normal limits  ACETAMINOPHEN LEVEL - Abnormal; Notable for the following components:   Acetaminophen (Tylenol), Serum <10 (*)    All other components within normal limits  CBC - Abnormal; Notable for the following components:   WBC 12.0 (*)    All other components within normal limits  URINE DRUG SCREEN, QUALITATIVE ([facility] ONLY) - Abnormal; Notable for the following components:   Cannabinoid 50 Ng, Ur [place] POSITIVE (*)    All other components within normal limits  RESPIRATORY PANEL BY RT PCR (FLU A&B, COVID)  COMPREHENSIVE METABOLIC PANEL  ETHANOL  VALPROIC ACID LEVEL   ____________________________________________  EKG  No indication for emergent EKG ____________________________________________  RADIOLOGY I, [name], personally viewed and evaluated these images (plain radiographs) as part of my medical decision making, as well as reviewing the written report by the radiologist.  ED MD interpretation: No indication for emergent imaging  Official radiology report(s): No results found.  ____________________________________________   PROCEDURES   Procedure(s) performed (including Critical Care):  Procedures   ____________________________________________   INITIAL IMPRESSION / MDM / ASSESSMENT AND PLAN / ED COURSE  As part of my medical decision making, I reviewed the following data within the electronic MEDICAL RECORD NUMBER Nursing notes reviewed and incorporated, Labs reviewed , Old chart reviewed, A consult was requested and obtained from this/these consultant(s) Psychiatry and Notes from prior ED visits   Differential diagnosis includes, but is not limited  to, nonspecific psychosis, chronic schizophrenia or schizoaffective disorder, medication or drug side effect.  The patient does not appear to represent a danger to himself or anyone else and does not meet criteria for IVC.  However he came in for help with his auditory hallucinations so we will consult psychiatry.  His lab work is generally reassuring within a normal comprehensive metabolic panel, therapeutic valproic acid level, negative salicylate and acetaminophen and ethanol levels, negative COVID-19 screening, and a urine drug screen positive for cannabinoids only.  His CBC is within normal limits as well.  The patient has been placed in psychiatric observation due to the need to provide a safe environment for the patient while obtaining psychiatric consultation and evaluation, as well as ongoing medical and medication management to treat the patient's condition.  The patient has not been placed under full IVC at this time.            ____________________________________________  FINAL CLINICAL IMPRESSION(S) / ED DIAGNOSES  Final diagnoses:  Auditory hallucination     MEDICATIONS GIVEN DURING THIS VISIT:  Medications - No data to display   ED Discharge Orders    None      *  Please note:  Kenneth Jensen was evaluated in Emergency Department on 11/05/2019 for the symptoms described in the history of present illness. He was evaluated in the context of the global COVID-19 pandemic, which necessitated consideration that the patient might be at risk for infection with the SARS-CoV-2 virus that causes COVID-19. Institutional protocols and algorithms that pertain to the evaluation of patients at risk for COVID-19 are in a state of rapid change based on information released by regulatory bodies including the CDC and federal and state organizations. These policies and algorithms were followed during the patient's care in the ED.  Some ED evaluations and interventions may be delayed as  a result of limited staffing during and after the pandemic.*  Note:  This document was prepared using Dragon voice recognition software and may include unintentional dictation errors.   [name], MD 11/05/19 [id number]

## 2020-04-24 DIAGNOSIS — E559 Vitamin D deficiency, unspecified: Secondary | ICD-10-CM | POA: Diagnosis not present

## 2020-04-24 DIAGNOSIS — E782 Mixed hyperlipidemia: Secondary | ICD-10-CM | POA: Diagnosis not present

## 2020-04-24 DIAGNOSIS — F209 Schizophrenia, unspecified: Secondary | ICD-10-CM | POA: Diagnosis not present

## 2020-04-24 DIAGNOSIS — I10 Essential (primary) hypertension: Secondary | ICD-10-CM | POA: Diagnosis not present

## 2020-04-24 DIAGNOSIS — R7303 Prediabetes: Secondary | ICD-10-CM | POA: Diagnosis not present

## 2020-04-24 DIAGNOSIS — J4520 Mild intermittent asthma, uncomplicated: Secondary | ICD-10-CM | POA: Diagnosis not present

## 2020-04-24 DIAGNOSIS — E039 Hypothyroidism, unspecified: Secondary | ICD-10-CM | POA: Diagnosis not present

## 2020-05-08 DIAGNOSIS — F209 Schizophrenia, unspecified: Secondary | ICD-10-CM | POA: Diagnosis not present

## 2020-05-08 DIAGNOSIS — R300 Dysuria: Secondary | ICD-10-CM | POA: Diagnosis not present

## 2020-05-08 DIAGNOSIS — R030 Elevated blood-pressure reading, without diagnosis of hypertension: Secondary | ICD-10-CM | POA: Diagnosis not present

## 2020-05-08 DIAGNOSIS — Z202 Contact with and (suspected) exposure to infections with a predominantly sexual mode of transmission: Secondary | ICD-10-CM | POA: Diagnosis not present

## 2020-05-12 ENCOUNTER — Emergency Department
Admission: EM | Admit: 2020-05-12 | Discharge: 2020-05-13 | Payer: Medicare HMO | Attending: Emergency Medicine | Admitting: Emergency Medicine

## 2020-05-12 DIAGNOSIS — Z1339 Encounter for screening examination for other mental health and behavioral disorders: Secondary | ICD-10-CM | POA: Diagnosis not present

## 2020-05-12 DIAGNOSIS — F250 Schizoaffective disorder, bipolar type: Secondary | ICD-10-CM | POA: Insufficient documentation

## 2020-05-12 DIAGNOSIS — F17200 Nicotine dependence, unspecified, uncomplicated: Secondary | ICD-10-CM | POA: Diagnosis not present

## 2020-05-12 DIAGNOSIS — Z20822 Contact with and (suspected) exposure to covid-19: Secondary | ICD-10-CM | POA: Diagnosis not present

## 2020-05-12 DIAGNOSIS — J45909 Unspecified asthma, uncomplicated: Secondary | ICD-10-CM | POA: Diagnosis not present

## 2020-05-12 DIAGNOSIS — R059 Cough, unspecified: Secondary | ICD-10-CM | POA: Diagnosis not present

## 2020-05-12 DIAGNOSIS — R440 Auditory hallucinations: Secondary | ICD-10-CM | POA: Diagnosis not present

## 2020-05-12 LAB — COMPREHENSIVE METABOLIC PANEL
ALT: 11 U/L (ref 0–44)
AST: 19 U/L (ref 15–41)
Albumin: 4.1 g/dL (ref 3.5–5.0)
Alkaline Phosphatase: 43 U/L (ref 38–126)
Anion gap: 10 (ref 5–15)
BUN: 9 mg/dL (ref 6–20)
CO2: 26 mmol/L (ref 22–32)
Calcium: 8.7 mg/dL — ABNORMAL LOW (ref 8.9–10.3)
Chloride: 102 mmol/L (ref 98–111)
Creatinine, Ser: 0.93 mg/dL (ref 0.61–1.24)
GFR, Estimated: >60 mL/min (ref >60)
Glucose, Bld: 126 mg/dL — ABNORMAL HIGH (ref 70–99)
Potassium: 3.2 mmol/L — ABNORMAL LOW (ref 3.5–5.1)
Sodium: 138 mmol/L (ref 135–145)
Total Bilirubin: 0.7 mg/dL (ref 0.3–1.2)
Total Protein: 7.4 g/dL (ref 6.5–8.1)

## 2020-05-12 LAB — CBC
HCT: 37.6 % — ABNORMAL LOW (ref 39.0–52.0)
Hemoglobin: 12.8 g/dL — ABNORMAL LOW (ref 13.0–17.0)
MCH: 30.7 pg (ref 26.0–34.0)
MCHC: 34 g/dL (ref 30.0–36.0)
MCV: 90.2 fL (ref 80.0–100.0)
Platelets: 270 10*3/uL (ref 150–400)
RBC: 4.17 MIL/uL — ABNORMAL LOW (ref 4.22–5.81)
RDW: 13.3 % (ref 11.5–15.5)
WBC: 14.7 10*3/uL — ABNORMAL HIGH (ref 4.0–10.5)
nRBC: 0 % (ref 0.0–0.2)

## 2020-05-12 LAB — ACETAMINOPHEN LEVEL: Acetaminophen (Tylenol), Serum: <10 ug/mL — ABNORMAL LOW (ref 10–30)

## 2020-05-12 LAB — URINE DRUG SCREEN, QUALITATIVE (ARMC ONLY)
Amphetamines, Ur Screen: NOT DETECTED
Barbiturates, Ur Screen: NOT DETECTED
Benzodiazepine, Ur Scrn: NOT DETECTED
Cannabinoid 50 Ng, Ur ~~LOC~~: POSITIVE — AB
Cocaine Metabolite,Ur ~~LOC~~: NOT DETECTED
MDMA (Ecstasy)Ur Screen: NOT DETECTED
Methadone Scn, Ur: NOT DETECTED
Opiate, Ur Screen: NOT DETECTED
Phencyclidine (PCP) Ur S: NOT DETECTED
Tricyclic, Ur Screen: NOT DETECTED

## 2020-05-12 LAB — ETHANOL: Alcohol, Ethyl (B): <10 mg/dL (ref <10)

## 2020-05-12 LAB — SALICYLATE LEVEL: Salicylate Lvl: <7 mg/dL — ABNORMAL LOW (ref 7.0–30.0)

## 2020-05-12 LAB — VALPROIC ACID LEVEL: Valproic Acid Lvl: 84 ug/mL (ref 50.0–100.0)

## 2020-05-12 MED ORDER — POTASSIUM CHLORIDE CRYS ER 20 MEQ PO TBCR: 40.0000 meq | EXTENDED_RELEASE_TABLET | Freq: Once | ORAL | Status: DC

## 2020-05-12 NOTE — ED Notes (Signed)
Pt changed into suicide clothing.  Belongings in 2 bags: sneakers, socks, white tshirt, blue tshirt, jeans, lighter, wallet with ID and green dot bank card, pocket knife (given to campus security), basketball shorts, black belt, boxers, black jacket  ACT team Terrance: [phone]

## 2020-05-12 NOTE — ED Triage Notes (Signed)
FIRST NURSE NOTE:  Pt here voluntary with [place] PD officer, states he has been hearing voices reports he has hx of schizophrenia. Pt calm and cooperative on arrival.  Marijuana odor noted.

## 2020-05-12 NOTE — ED Triage Notes (Addendum)
Voluntarily brought in by PD.  'People trying to give me covid, putting dog filth on me, these girls out here to get me.' Admits to being off meds (seroquel, shot, something else) x2days, admits to marijuana/cocaine 2days ago. Denies physical pain. To suicide questions: 'I need to get away cuz somebody was either going to hurt me or I was going to hurt myself.'  Also states 'can yall keep me for 2-3wks until my housing comes through, I am homeless.'

## 2020-05-12 NOTE — BH Assessment (Addendum)
Comprehensive Clinical Assessment (CCA) Note  05/12/2020 Kenneth Jensen [id number]  Chief Complaint: Patient is a 30 year old male presenting to [facility] ED voluntarily. Per triage note Pt here voluntary with [place] Jensen PD officer, states he has been hearing voices reports he has hx of schizophrenia. Pt calm and cooperative on arrival. 'People trying to give me covid, putting dog filth on me, these girls out here to get me.' Admits to being off meds (seroquel, shot, something else) x2days, admits to marijuana/cocaine 2days ago. Denies physical pain. To suicide questions: 'I need to get away cuz somebody was either going to hurt me or I was going to hurt myself.' During assessment patient appears alert and oriented x4, irritable and withdrawn when asking patient questions. Patient reports "I've been hearing voices for 2-3 days, I tried to use my coping skills but they don't work." Patient reports having an ACT team and reports when he calls his ACT team "they tell me the same thing, to use my coping skills." "I moved from this house I was living in because the people there need to leave me alone." When asked if patient is experiencing SI he reports "no, I want to work but they won't hire me." He denies HI, he continues to report AH "I mind my business with my voices." Patient reports that his sleep is "not good" and his appetite "I eat when I can." When asked if patient is taking his medications patient becomes irritable and he reports "my medicine is running through my body, I get my shot, stop asking about those pills." Patient denies SI/HI/VH, reports AH.  Attempted to contact patient's ACT team at [phone] but no answer, a HIPAA compliant voicemail was left to return phone call  Per Psyc NP Kenneth Jensen patient to DC in the morning  Chief Complaint  Patient presents with  . Schizophrenia   Visit Diagnosis: Schizoaffective Disorder, bipolar type   CCA Screening, Triage and Referral  (STR)  Patient Reported Information How did you hear about [place]? Self  Referral name: No data recorded Referral phone number: No data recorded  Whom do you see for routine medical problems? Other (Comment)  Practice/Facility Name: No data recorded Practice/Facility Phone Number: No data recorded Name of Contact: No data recorded Contact Number: No data recorded Contact Fax Number: No data recorded Prescriber Name: No data recorded Prescriber Address (if known): No data recorded  What Is the Reason for Your Visit/Call Today? Patient reports having AH for the last 2-3 days  How Long Has This Been Causing You Problems? > than 6 months  What Do You Feel Would Help You the Most Today? Treatment for Depression or other mood problem   Have You Recently Been in Any Inpatient Treatment (Hospital/Detox/Crisis Center/28-Day Program)? No  Name/Location of Program/Hospital:No data recorded How Long Were You There? No data recorded When Were You Discharged? No data recorded  Have You Ever Received Services From [facility] Before? Yes  Who Do You See at [facility]? Inpatient treatment   Have You Recently Had Any Thoughts About Hurting Yourself? No  Are You Planning to Commit Suicide/Harm Yourself At This time? No   Have you Recently Had Thoughts About Hurting Someone Kenneth Jensen? No  Explanation: No data recorded  Have You Used Any Alcohol or Drugs in the Past 24 Hours? No  How Long Ago Did You Use Drugs or Alcohol? No data recorded What Did You Use and How Much? No data recorded  Do You Currently Have  a Therapist/Psychiatrist? Yes  Name of Therapist/Psychiatrist: ACT team   Have You Been Recently Discharged From Any Office Practice or Programs? No  Explanation of Discharge From Practice/Program: No data recorded    CCA Screening Triage Referral Assessment Type of Contact: Face-to-Face  Is this Initial or Reassessment? No data recorded Date Telepsych consult ordered in [facility]:   11/05/2019  [facility]   Patient Reported Information Reviewed? Yes  Patient Left Without Being Seen? No data recorded Reason for Not Completing Assessment: No data recorded  Collateral Involvement: No data recorded  Does Patient Have a Court Appointed Legal Guardian? No data recorded Name and Contact of Legal Guardian: Self  If Minor and Not Living with Parent(s), Who has Custody? n/a  Is CPS involved or ever been involved? Never  Is APS involved or ever been involved? Never   Patient Determined To Be At Risk for Harm To Self or Others Based on Review of Patient Reported Information or Presenting Complaint? No  Method: No data recorded Availability of Means: No data recorded Intent: No data recorded Notification Required: No data recorded Additional Information for Danger to Others Potential: No data recorded Additional Comments for Danger to Others Potential: No data recorded Are There Guns or Other Weapons in Your Home? No data recorded Types of Guns/Weapons: No data recorded Are These Weapons Safely Secured?                            No data recorded Who Could Verify You Are Able To Have These Secured: No data recorded Do You Have any Outstanding Charges, Pending Court Dates, Parole/Probation? No data recorded Contacted To Inform of Risk of Harm To Self or Others: No data recorded  [facility]   Does Patient Present under Involuntary Commitment? No  IVC Papers Initial File Date: No data recorded  [place]   Patient Currently Receiving the Following Services: ACTT Psychologist, educational([facility])   Determination of Need: Emergent (2 hours)   Options For Referral: No data recorded    CCA Biopsychosocial Intake/Chief Complaint:  [facility] due to experiencing AH, patient is presenting paranoid with persecutory delusions  Current Symptoms/Problems:  [facility] due to experiencing AH, patient is presenting paranoid with persecutory delusions   Patient Reported Schizophrenia/Schizoaffective Diagnosis in Past: Yes   Strengths: Patient is able to communicate  Preferences: Unknown  Abilities: Patient is able to communicate   Type of Services Patient Feels are Needed: Inpatient treatment   Initial Clinical Notes/Concerns: None   Mental Health Symptoms Depression:  Irritability   Duration of Depressive symptoms: Greater than two weeks   Mania:  Irritability; Racing thoughts   Anxiety:   None   Psychosis:  Delusions; Hallucinations   Duration of Psychotic symptoms: Greater than six months   Trauma:  None   Obsessions:  None   Compulsions:  None   Inattention:  None   Hyperactivity/Impulsivity:  N/A   Oppositional/Defiant Behaviors:  None   Emotional Irregularity:  None   Other Mood/Personality Symptoms:  No data recorded   Mental Status Exam Appearance and self-care  Stature:  Average   Weight:  Average weight   Clothing:  Casual   Grooming:  Normal   Cosmetic use:  None   Posture/gait:  Normal   Motor activity:  Restless   Sensorium  Attention:  Normal   Concentration:  Normal   Orientation:  X5   Recall/memory:  Normal   Affect and Mood  Affect:  Appropriate   Mood:  Angry; Irritable   Relating  Eye contact:  Avoided   Facial expression:  Responsive   Attitude toward examiner:  Irritable; Defensive   Thought and Language  Speech flow: Clear and Coherent   Thought content:  Delusions   Preoccupation:  Ruminations   Hallucinations:  Auditory   Organization:  No data recorded  [employer] of Knowledge:  Fair   Intelligence:  Average   Abstraction:  Normal   Judgement:  Fair   Brewing technologist   Insight:  Lacking   Decision Making:  Normal   Social Functioning  Social Maturity:  Impulsive   Social  Judgement:  Heedless   Stress  Stressors:  Housing   Coping Ability:  Exhausted   Skill Deficits:  None   Supports:  Friends/Service system     Religion: Religion/Spirituality Are You A Religious Person?: No  Leisure/Recreation: Leisure / Recreation Do You Have Hobbies?: No  Exercise/Diet: Exercise/Diet Do You Exercise?: No Have You Gained or Lost A Significant Amount of Weight in the Past Six Months?: No Do You Follow a Special Diet?: No Do You Have Any Trouble Sleeping?: No   CCA Employment/Education Employment/Work Situation: Employment / Work Situation Employment situation: On disability Why is patient on disability: Mental Health Diagnosis How long has patient been on disability: Unknown Has patient ever been in the [employer]?: No  Education: Education Is Patient Currently Attending School?: No Did You Product manager?: No Did Designer, television/film set?: No Did You Have An Individualized Education Program (IIEP): No Did You Have Any Difficulty At School?: No Patient's Education Has Been Impacted by Current Illness: No   CCA Family/Childhood History Family and Relationship History: Family history Marital status: Single Are you sexually active?:  (Unknown) What is your sexual orientation?: Unknown Has your sexual activity been affected by drugs, alcohol, medication, or emotional stress?: Unknown Does patient have children?: No  Childhood History:  Childhood History Additional childhood history information: None reported Description of patient's relationship with caregiver when they were a child: None reported Patient's description of current relationship with people who raised him/her: None reported How were you disciplined when you got in trouble as a child/adolescent?: None reported Does patient have siblings?: No Did patient suffer any verbal/emotional/physical/sexual abuse as a child?:  (UTA) Did patient suffer from severe childhood neglect?:   (UTA) Has patient ever been sexually abused/assaulted/raped as an adolescent or adult?:  (UTA) Was the patient ever a victim of a crime or a disaster?:  (UTA) Witnessed domestic violence?:  (UTA) Has patient been affected by domestic violence as an adult?:  Industrial/product designer)  Child/Adolescent Assessment:     CCA Substance Use Alcohol/Drug Use: Alcohol / Drug Use Pain Medications: See MAR Prescriptions: See MAR Over the Counter: See MAR History of alcohol / drug use?: Yes Substance #1 Name of Substance 1: Cocaine Substance #2 Name of Substance 2: Marijuana                     ASAM's:  Six Dimensions of Multidimensional Assessment  Dimension 1:  Acute Intoxication and/or Withdrawal Potential:      Dimension 2:  Biomedical Conditions and Complications:      Dimension 3:  Emotional, Behavioral, or Cognitive Conditions and Complications:     Dimension 4:  Readiness to Change:  Dimension 5:  Relapse, Continued use, or Continued Problem Potential:     Dimension 6:  Recovery/Living Environment:     ASAM Severity Score:    ASAM Recommended Level of Treatment:     Substance use Disorder (SUD)    Recommendations for Services/Supports/Treatments: Per Psyc NP Kenneth Jensen patient to DC in the morning  DSM5 Diagnoses: Patient Active Problem List   Diagnosis Date Noted  . Schizoaffective disorder, bipolar type (HCC) 03/25/2019    Patient Centered Plan: Patient is on the following Treatment Plan(s):  Impulse Control   Referrals to Alternative Service(s): Referred to Alternative Service(s):   Place:   Date:   Time:    Referred to Alternative Service(s):   Place:   Date:   Time:    Referred to Alternative Service(s):   Place:   Date:   Time:    Referred to Alternative Service(s):   Place:   Date:   Time:     Bruna Dills A Kiylee Thoreson, LCAS-A

## 2020-05-12 NOTE — ED Provider Notes (Signed)
[facility] Emergency Department Provider Note   ____________________________________________   Event Date/Time   First MD Initiated Contact with Patient 05/12/20 2149     (approximate)  I have reviewed the triage vital signs and the nursing notes.   HISTORY  Chief Complaint Psychiatric Evaluation   HPI Kenneth Jensen is a 30 y.o. male with possible history of schizoaffective disorder and asthma who presents to the ED complaining of hallucinations.  Patient reports that he has been hearing voices that are telling him he is worthless for the past few days.  He admits to being off of his medications recently, also admits to marijuana and cocaine use 2 days ago.  He denies any thoughts of harming himself or others, states "I want to stay in the hospital until you fix me."  He denies any medical complaints at this time.        Past Medical History:  Diagnosis Date  . Asthma   . Bipolar 1 disorder (HCC)   . Depression   . Schizophrenia (HCC)   . Schizotaxia     Patient Active Problem List   Diagnosis Date Noted  . Schizoaffective disorder, bipolar type (HCC) 03/25/2019    No past surgical history on file.  Prior to Admission medications   Medication Sig Start Date End Date Taking? Authorizing Provider  divalproex (DEPAKOTE ER) 500 MG 24 hr tablet Take 1 tablet (500 mg total) by mouth 3 (three) times daily. 04/14/19  Yes Sharman Cheek, MD  paliperidone (INVEGA SUSTENNA) 234 MG/1.5ML SUSY injection Inject 234 mg into the muscle every 30 (thirty) days.   Yes [provider]  QUEtiapine (SEROQUEL) 200 MG tablet Take 1 tablet (200 mg total) by mouth at bedtime. 04/14/19  Yes Sharman Cheek, MD  traZODone (DESYREL) 50 MG tablet Take 1 tablet (50 mg total) by mouth at bedtime as needed for sleep. 04/14/19  Yes Sharman Cheek, MD    Allergies Haldol [haloperidol], Percocet [oxycodone-acetaminophen], Vicodin [hydrocodone-acetaminophen],  and Vicodin [hydrocodone-acetaminophen]  No family history on file.  Social History Social History   Tobacco Use  . Smoking status: Current Every Day Smoker  . Smokeless tobacco: Never Used  Substance Use Topics  . Alcohol use: Yes    Comment: social  . Drug use: Yes    Types: Marijuana    Review of Systems  Constitutional: No fever/chills Eyes: No visual changes. ENT: No sore throat. Cardiovascular: Denies chest pain. Respiratory: Denies shortness of breath. Gastrointestinal: No abdominal pain.  No nausea, no vomiting.  No diarrhea.  No constipation. Genitourinary: Negative for dysuria. Musculoskeletal: Negative for back pain. Skin: Negative for rash. Neurological: Negative for headaches, focal weakness or numbness.  Positive for auditory hallucinations.  ____________________________________________   PHYSICAL EXAM:  VITAL SIGNS: ED Triage Vitals [05/12/20 2134]  Enc Vitals Group     BP (!) 143/79     Pulse Rate 93     Resp 14     Temp 98.3 F (36.8 C)     Temp Source Oral     SpO2 94 %     Weight      Height      Head Circumference      Peak Flow      Pain Score      Pain Loc      Pain Edu?      Excl. in GC?     Constitutional: Alert and oriented. Eyes: Conjunctivae are normal. Head: Atraumatic. Nose: No congestion/rhinnorhea. Mouth/Throat: Mucous  membranes are moist. Neck: Normal ROM Cardiovascular: Normal rate, regular rhythm. Grossly normal heart sounds. Respiratory: Normal respiratory effort.  No retractions. Lungs CTAB. Gastrointestinal: Soft and nontender. No distention. Genitourinary: deferred Musculoskeletal: No lower extremity tenderness nor edema. Neurologic:  Normal speech and language. No gross focal neurologic deficits are appreciated. Skin:  Skin is warm, dry and intact. No rash noted. Psychiatric: Mood and affect are normal. Speech and behavior are normal.  ____________________________________________   LABS (all labs ordered  are listed, but only abnormal results are displayed)  Labs Reviewed  COMPREHENSIVE METABOLIC PANEL - Abnormal; Notable for the following components:      Result Value   Potassium 3.2 (*)    Glucose, Bld 126 (*)    Calcium 8.7 (*)    All other components within normal limits  CBC - Abnormal; Notable for the following components:   WBC 14.7 (*)    RBC 4.17 (*)    Hemoglobin 12.8 (*)    HCT 37.6 (*)    All other components within normal limits  RESP PANEL BY RT-PCR (FLU A&B, COVID) ARPGX2  ETHANOL  SALICYLATE LEVEL  ACETAMINOPHEN LEVEL  URINE DRUG SCREEN, QUALITATIVE ([facility] ONLY)  VALPROIC ACID LEVEL    PROCEDURES  Procedure(s) performed (including Critical Care):  Procedures   ____________________________________________   INITIAL IMPRESSION / ASSESSMENT AND PLAN / ED COURSE       30 year old male with past medical history of schizoaffective disorder and asthma presents the ED with auditory hallucinations telling him that he is worthless.  He denies any specific intent to harm himself and states he is here because he would like treatment for psychiatric illness.  Given he is calm and cooperative and seems interested in treatment, we will hold off on IVC.  He does not appear to represent an acute threat to himself or others.  He denies any medical complaints and screening labs are unremarkable, patient may be medically cleared for psychiatric evaluation.  The patient has been placed in psychiatric observation due to the need to provide a safe environment for the patient while obtaining psychiatric consultation and evaluation, as well as ongoing medical and medication management to treat the patient's condition.  The patient has not been placed under full IVC at this time.       ____________________________________________   FINAL CLINICAL IMPRESSION(S) / ED DIAGNOSES  Final diagnoses:  Auditory hallucinations  Schizoaffective disorder, bipolar type [facility])     ED  Discharge Orders    None       Note:  This document was prepared using Dragon voice recognition software and may include unintentional dictation errors.   Chesley Noon, MD 05/12/20 2222

## 2020-05-13 ENCOUNTER — Emergency Department (HOSPITAL_BASED_OUTPATIENT_CLINIC_OR_DEPARTMENT_OTHER)
Admission: EM | Admit: 2020-05-13 | Discharge: 2020-05-14 | Payer: Medicare HMO | Source: Home / Self Care | Attending: Emergency Medicine | Admitting: Emergency Medicine

## 2020-05-13 ENCOUNTER — Other Ambulatory Visit: Payer: Self-pay

## 2020-05-13 ENCOUNTER — Emergency Department: Payer: Medicare HMO

## 2020-05-13 DIAGNOSIS — J45909 Unspecified asthma, uncomplicated: Secondary | ICD-10-CM | POA: Insufficient documentation

## 2020-05-13 DIAGNOSIS — F250 Schizoaffective disorder, bipolar type: Secondary | ICD-10-CM | POA: Diagnosis present

## 2020-05-13 DIAGNOSIS — F17200 Nicotine dependence, unspecified, uncomplicated: Secondary | ICD-10-CM | POA: Insufficient documentation

## 2020-05-13 DIAGNOSIS — R440 Auditory hallucinations: Secondary | ICD-10-CM | POA: Diagnosis not present

## 2020-05-13 DIAGNOSIS — R059 Cough, unspecified: Secondary | ICD-10-CM | POA: Diagnosis not present

## 2020-05-13 LAB — RESP PANEL BY RT-PCR (FLU A&B, COVID) ARPGX2
Influenza A by PCR: NEGATIVE
Influenza B by PCR: NEGATIVE
SARS Coronavirus 2 by RT PCR: NEGATIVE

## 2020-05-13 MED ORDER — QUETIAPINE FUMARATE 200 MG PO TABS
200.0000 mg | ORAL_TABLET | Freq: Every day | ORAL | Status: DC
  Administered 2020-05-13: 200 mg via ORAL
  Filled 2020-05-13: qty 1

## 2020-05-13 MED ORDER — TRAZODONE HCL 50 MG PO TABS
50.0000 mg | ORAL_TABLET | Freq: Every evening | ORAL | Status: DC | PRN
  Administered 2020-05-13: 50 mg via ORAL
  Filled 2020-05-13: qty 1

## 2020-05-13 MED ORDER — NICOTINE 21 MG/24HR TD PT24
21.0000 mg | MEDICATED_PATCH | Freq: Every day | TRANSDERMAL | Status: DC
  Administered 2020-05-13: 21 mg via TRANSDERMAL
  Filled 2020-05-13: qty 1

## 2020-05-13 MED ORDER — DIVALPROEX SODIUM ER 500 MG PO TB24
500.0000 mg | ORAL_TABLET | Freq: Three times a day (TID) | ORAL | Status: DC
  Administered 2020-05-14: 500 mg via ORAL
  Filled 2020-05-13: qty 2
  Filled 2020-05-13: qty 1

## 2020-05-13 NOTE — ED Notes (Signed)
Pt noted to have very strong, congested cough. WBC yesterday with bloodwork was elevated. Dr. Erma Heritage notified. CXR ordered.

## 2020-05-13 NOTE — ED Notes (Addendum)
Pt informed that he is being discharged. Pt gets upset and begins yelling about "not having a place to stay" and "hearing voices". Pt asking for multiple orange juices. States that he "has a place to stay but I don't want to go there."

## 2020-05-13 NOTE — ED Notes (Signed)
Pt states "as soon as I left I started hearing voices that told me I wasn't going to be anything and I was going to be homeless and a crack head". Denies SI/HI.

## 2020-05-13 NOTE — ED Notes (Signed)
Dinner is beside nursing station, pt is currently sleeping.

## 2020-05-13 NOTE — ED Notes (Signed)
Pt given belongings back

## 2020-05-13 NOTE — ED Notes (Signed)
Spoke with aunt with pt permission. Updated on plan of care.

## 2020-05-13 NOTE — ED Provider Notes (Signed)
Emergency Medicine Observation Re-evaluation Note  Kenneth Jensen is a 30 y.o. male, seen on rounds today.  Pt initially presented to the ED for complaints of Schizophrenia Currently, the patient is resting.  Physical Exam  BP (!) 143/79 (BP Location: Left Arm)   Pulse 93   Temp 98.3 F (36.8 C) (Oral)   Resp 14   SpO2 94%  Physical Exam Gen:  No acute distress Resp:  Breathing easily and comfortably, no accessory muscle usage Neuro:  Moving all four extremities, no gross focal neuro deficits Psych:  Resting currently, calm when awake ED Course / MDM  EKG:   I have reviewed the labs performed to date as well as medications administered while in observation.  Recent changes in the last 24 hours include initial assessment and evaluation by psychiatry.  Plan  Current plan is for psychiatric admission. Patient is not under full IVC at this time.   [name], MD 05/13/20 [id number]

## 2020-05-13 NOTE — ED Triage Notes (Signed)
Pt returns to ER c/o SI after being DC this AM. Per EDP no repeat blood work. Pt states "sometimes it feels like people want me to die." pt listing off all his family members. Denies plan.

## 2020-05-13 NOTE — ED Provider Notes (Signed)
[facility] Emergency Department Provider Note ____________________________________________   Event Date/Time   First MD Initiated Contact with Patient 05/13/20 1211     (approximate)  I have reviewed the triage vital signs and the nursing notes.  HISTORY  Chief Complaint Suicidal   HPI Kenneth Jensen is a 30 y.o. malewho presents to the ED for evaluation of suicidality  Chart review indicates I just discharged the patient about 2 hours ago.   Patient returns to the ED indicating command auditory hallucinations and suicidality.  He primarily reports that he does not want to go back home and he does not want to be homeless.  He reports via some black and milds, and then returning to the ED because he heard voices in his head.  He denies any hallucinations at this point.  Denies assault, falls, syncopal episodes or injuries since he left.   Past Medical History:  Diagnosis Date  . Asthma   . Bipolar 1 disorder (HCC)   . Depression   . Schizophrenia (HCC)   . Schizotaxia     Patient Active Problem List   Diagnosis Date Noted  . Auditory hallucinations   . Schizoaffective disorder, bipolar type (HCC) 03/25/2019    History reviewed. No pertinent surgical history.  Prior to Admission medications   Medication Sig Start Date End Date Taking? Authorizing Provider  divalproex (DEPAKOTE ER) 500 MG 24 hr tablet Take 1 tablet (500 mg total) by mouth 3 (three) times daily. 04/14/19   Sharman Cheek, MD  paliperidone (INVEGA SUSTENNA) 234 MG/1.5ML SUSY injection Inject 234 mg into the muscle every 30 (thirty) days.    [provider]  QUEtiapine (SEROQUEL) 200 MG tablet Take 1 tablet (200 mg total) by mouth at bedtime. 04/14/19   Sharman Cheek, MD  traZODone (DESYREL) 50 MG tablet Take 1 tablet (50 mg total) by mouth at bedtime as needed for sleep. 04/14/19   Sharman Cheek, MD    Allergies Haldol [haloperidol], Percocet  [oxycodone-acetaminophen], Vicodin [hydrocodone-acetaminophen], and Vicodin [hydrocodone-acetaminophen]  History reviewed. No pertinent family history.  Social History Social History   Tobacco Use  . Smoking status: Current Every Day Smoker  . Smokeless tobacco: Never Used  Substance Use Topics  . Alcohol use: Yes    Comment: social  . Drug use: Yes    Types: Marijuana    Review of Systems  Constitutional: No fever/chills Eyes: No visual changes. ENT: No sore throat. Cardiovascular: Denies chest pain. Respiratory: Denies shortness of breath. Gastrointestinal: No abdominal pain.  No nausea, no vomiting.  No diarrhea.  No constipation. Genitourinary: Negative for dysuria. Musculoskeletal: Negative for back pain. Skin: Negative for rash. Neurological: Negative for headaches, focal weakness or numbness.  ____________________________________________   PHYSICAL EXAM:  VITAL SIGNS: Vitals:   05/13/20 1155  BP: 113/79  Pulse: 93  Resp: 18  Temp: 98 F (36.7 C)  SpO2: 97%      Constitutional: Alert and oriented. Well appearing and in no acute distress. Eyes: Conjunctivae are normal. PERRL. EOMI. Head: Atraumatic. Nose: No congestion/rhinnorhea. Mouth/Throat: Mucous membranes are moist.  Oropharynx non-erythematous. Neck: No stridor. No cervical spine tenderness to palpation. Cardiovascular: Normal rate, regular rhythm. Grossly normal heart sounds.  Good peripheral circulation. Respiratory: Normal respiratory effort.  No retractions. Lungs CTAB. Gastrointestinal: Soft , nondistended, nontender to palpation. No CVA tenderness. Musculoskeletal: No lower extremity tenderness nor edema.  No joint effusions. No signs of acute trauma. Neurologic:  Normal speech and language. No gross focal neurologic deficits  are appreciated. No gait instability noted. Skin:  Skin is warm, dry and intact. No rash noted. Psychiatric: Mood and affect are flat.  Speech processes little  tangential.  Not pressured speech ____________________________________________   PROCEDURES and INTERVENTIONS  Procedure(s) performed (including Critical Care):  Procedures  Medications - No data to display  ____________________________________________   MDM / ED COURSE   31 year old male schizophrenia returns to the ED with complaints of command auditory hallucinations and suicidality.  No evidence of medical pathology to preclude psychiatric assessment and disposition.  Suspect a degree of malingering.  Continue his recurrent visits, we will have psychiatry reevaluate the patient.  Voluntary at this time.     ____________________________________________   FINAL CLINICAL IMPRESSION(S) / ED DIAGNOSES  Final diagnoses:  Auditory hallucination     ED Discharge Orders    None       [name] Katrinka Blazing   Note:  This document was prepared using Dragon voice recognition software and may include unintentional dictation errors.   Delton Prairie, MD 05/13/20 [phone]

## 2020-05-13 NOTE — ED Notes (Signed)
This RN spoke with Harriett Sine with pt's ACT team. ACT team feels that pt is abusing system in order to have a place to stay. ACT team comfortable with him being d/c. Will notify Dr Katrinka Blazing.

## 2020-05-13 NOTE — ED Notes (Signed)
Pt given phone to call ACT team

## 2020-05-13 NOTE — ED Notes (Addendum)
Pt dressed out by this RN and Jonny Ruiz EDT.   2 bags of belongings: Jeans, white t-shirt, wallet, pair of shoes, red shorts, underwear, blue shirt.   Pt is slow to dress out, keeps changing topic of conversation. Pt states he needs "to be admitted to use a washing machine." states that his clothes are dirty from "walking too much." pt c/o of how living at a group home he doesn't have any space. States he doesn't have any money, only "the money my dad left me. If I didn't have that I would be broke."

## 2020-05-13 NOTE — ED Notes (Signed)
Taken for xray

## 2020-05-13 NOTE — ED Notes (Signed)
Breakfast tray is at bed side and pt is sleeping at this time

## 2020-05-13 NOTE — Consult Note (Signed)
[facility] Face-to-Face Psychiatry Consult   Reason for Consult:  Psychiatric evaluation Referring Physician:  Dr. Derrill Kay Patient Identification: Kenneth Jensen MRN:  [id number] Principal Diagnosis: <principal problem not specified> Diagnosis:  Active Problems:   Schizoaffective disorder, bipolar type (HCC)   Total Time spent with patient: 45 minutes  Subjective:   Kenneth Jensen is a 30 y.o. male patient admitted with   HPI:  Tele Assessment  Kenneth Jensen, 30 y.o., male patient presented to [facility] voluntarily.   Patient seen  by TTS and this provider; chart reviewed and consulted with EDP on 05/13/20.  On evaluation Kenneth Jensen reports that he has been hearing voices. Per triage note Pt here voluntary with [place] PD officer, states he has been hearing voices reports he has hx of schizophrenia. Pt calm and cooperative on arrival. 'People trying to give me covid, putting dog filth on me, these girls out here to get me.'Admits to beingoff meds (seroquel, shot, something else) x2days, admits to marijuana/cocaine 2days ago. Denies physical pain.To suicide questions:'I need to get away cuz somebody was either going to hurt me or I was going to hurt myself.'   During evaluation Kenneth Jensen is laying down in 20h sleeping. He is difficult to awaken initially, however when awoken, patient is high strung and animated; he is alert/oriented x 4; hyper/agitated/ and talkative; and mood congruent with affect.  Patient is speaking in a clear tone at loud volume, and fast pace; with poor eye contact.  His thought process is coherent and relevant;  Patient reports hearing voice and states that his coping skills does not work.  HE says he hasn't called his act team because he knows they are going say "use your coping skills."  He reports, trying to mind his business when he hears the voices. He often drifted off to speaking about trying to find a job and no one hiring him.  There  is no indication that he is currently responding to internal/external stimuli or experiencing delusional thought content.  Patient deniesuicidal/self-harm/homicidal ideation, psychosis, and paranoia.    Although patients behaviors are peculiar at best, he does not meet criteria for inpatient hospitalization.   Recommendation: is to contact his act team and DC in the am.  Past Psychiatric History: Schizoaffective disorder   Risk to Self:  no Risk to Others:  no Prior Inpatient Therapy:  no Prior Outpatient Therapy:   no  Past Medical History:  Past Medical History:  Diagnosis Date  . Asthma   . Bipolar 1 disorder (HCC)   . Depression   . Schizophrenia (HCC)   . Schizotaxia    No past surgical history on file. Family History: No family history on file. Family Psychiatric  History: unknown Social History:  Social History   Substance and Sexual Activity  Alcohol Use Yes   Comment: social     Social History   Substance and Sexual Activity  Drug Use Yes  . Types: Marijuana    Social History   Socioeconomic History  . Marital status: Single    Spouse name: Not on file  . Number of children: Not on file  . Years of education: Not on file  . Highest education level: Not on file  Occupational History  . Not on file  Tobacco Use  . Smoking status: Current Every Day Smoker  . Smokeless tobacco: Never Used  Substance and Sexual Activity  . Alcohol use: Yes    Comment: social  . Drug use:  Yes    Types: Marijuana  . Sexual activity: Not on file  Other Topics Concern  . Not on file  Social History Narrative   ** Merged History Encounter **       Social Determinants of Health   Financial Resource Strain: Not on file  Food Insecurity: Not on file  Transportation Needs: Not on file  Physical Activity: Not on file  Stress: Not on file  Social Connections: Not on file   Additional Social History:    Allergies:   Allergies  Allergen Reactions  . Haldol  [Haloperidol]     Pt states "I went crazy"  . Percocet [Oxycodone-Acetaminophen]   . Vicodin [Hydrocodone-Acetaminophen] Itching  . Vicodin [Hydrocodone-Acetaminophen]     Labs:  Results for orders placed or performed during the hospital encounter of 05/12/20 (from the past 48 hour(s))  Comprehensive metabolic panel     Status: Abnormal   Collection Time: 05/12/20  9:41 PM  Result Value Ref Range   Sodium 138 135 - 145 mmol/L   Potassium 3.2 (L) 3.5 - 5.1 mmol/L   Chloride 102 98 - 111 mmol/L   CO2 26 22 - 32 mmol/L   Glucose, Bld 126 (H) 70 - 99 mg/dL    Comment: Glucose reference range applies only to samples taken after fasting for at least 8 hours.   BUN 9 6 - 20 mg/dL   Creatinine, Ser 6.81 0.61 - 1.24 mg/dL   Calcium 8.7 (L) 8.9 - 10.3 mg/dL   Total Protein 7.4 6.5 - 8.1 g/dL   Albumin 4.1 3.5 - 5.0 g/dL   AST 19 15 - 41 U/L   ALT 11 0 - 44 U/L   Alkaline Phosphatase 43 38 - 126 U/L   Total Bilirubin 0.7 0.3 - 1.2 mg/dL   GFR, Estimated >27 >51 mL/min    Comment: (NOTE) Calculated using the CKD-EPI Creatinine Equation (2021)    Anion gap 10 5 - 15    Comment: Performed at [facility], [address] [place], [place] [address]  Ethanol     Status: None   Collection Time: 05/12/20  9:41 PM  Result Value Ref Range   Alcohol, Ethyl (B) <10 <10 mg/dL    Comment: (NOTE) Lowest detectable limit for serum alcohol is 10 mg/dL.  For medical purposes only. Performed at [facility], [address] [place], [place] [address]   Salicylate level     Status: Abnormal   Collection Time: 05/12/20  9:41 PM  Result Value Ref Range   Salicylate Lvl <7.0 (L) 7.0 - 30.0 mg/dL    Comment: Performed at [facility], [address] [place], [place] [address]  Acetaminophen level     Status: Abnormal   Collection Time: 05/12/20  9:41 PM  Result Value Ref Range   Acetaminophen (Tylenol), Serum <10 (L) 10 - 30 ug/mL    Comment: (NOTE) Therapeutic  concentrations vary significantly. A range of 10-30 ug/mL  may be an effective concentration for many patients. However, some  are best treated at concentrations outside of this range. Acetaminophen concentrations >150 ug/mL at 4 hours after ingestion  and >50 ug/mL at 12 hours after ingestion are often associated with  toxic reactions.  Performed at [facility], [address] [place], [place] [address]   cbc     Status: Abnormal   Collection Time: 05/12/20  9:41 PM  Result Value Ref Range   WBC 14.7 (H) 4.0 - 10.5 K/uL  RBC 4.17 (L) 4.22 - 5.81 MIL/uL   Hemoglobin 12.8 (L) 13.0 - 17.0 g/dL   HCT 16.137.6 (L) 09.639.0 - 04.552.0 %   MCV 90.2 80.0 - 100.0 fL   MCH 30.7 26.0 - 34.0 pg   MCHC 34.0 30.0 - 36.0 g/dL   RDW 40.913.3 81.111.5 - 91.415.5 %   Platelets 270 150 - 400 K/uL   nRBC 0.0 0.0 - 0.2 %    Comment: Performed at [facility], [address] [place], [place] [address]  Valproic acid level     Status: None   Collection Time: 05/12/20  9:41 PM  Result Value Ref Range   Valproic Acid Lvl 84 50.0 - 100.0 ug/mL    Comment: Performed at [facility], [address] [place], [place] [address]  Urine Drug Screen, Qualitative     Status: Abnormal   Collection Time: 05/12/20  9:58 PM  Result Value Ref Range   Tricyclic, Ur Screen NONE DETECTED NONE DETECTED   Amphetamines, Ur Screen NONE DETECTED NONE DETECTED   MDMA (Ecstasy)Ur Screen NONE DETECTED NONE DETECTED   Cocaine Metabolite,Ur [place] NONE DETECTED NONE DETECTED   Opiate, Ur Screen NONE DETECTED NONE DETECTED   Phencyclidine (PCP) Ur S NONE DETECTED NONE DETECTED   Cannabinoid 50 Ng, Ur Selz POSITIVE (A) NONE DETECTED   Barbiturates, Ur Screen NONE DETECTED NONE DETECTED   Benzodiazepine, Ur Scrn NONE DETECTED NONE DETECTED   Methadone Scn, Ur NONE DETECTED NONE DETECTED    Comment: (NOTE) Tricyclics + metabolites, urine    Cutoff 1000 ng/mL Amphetamines + metabolites, urine  Cutoff 1000 ng/mL MDMA  (Ecstasy), urine              Cutoff 500 ng/mL Cocaine Metabolite, urine          Cutoff 300 ng/mL Opiate + metabolites, urine        Cutoff 300 ng/mL Phencyclidine (PCP), urine         Cutoff 25 ng/mL Cannabinoid, urine                 Cutoff 50 ng/mL Barbiturates + metabolites, urine  Cutoff 200 ng/mL Benzodiazepine, urine              Cutoff 200 ng/mL Methadone, urine                   Cutoff 300 ng/mL  The urine drug screen provides only a preliminary, unconfirmed analytical test result and should not be used for non-medical purposes. Clinical consideration and professional judgment should be applied to any positive drug screen result due to possible interfering substances. A more specific alternate chemical method must be used in order to obtain a confirmed analytical result. Gas chromatography / mass spectrometry (GC/MS) is the preferred confirm atory method. Performed at [facility], [address] [place], [place] [address]   Resp Panel by RT-PCR (Flu A&B, Covid) Nasopharyngeal Swab     Status: None   Collection Time: 05/12/20 10:15 PM   Specimen: Nasopharyngeal Swab; Nasopharyngeal(NP) swabs in vial transport medium  Result Value Ref Range   SARS Coronavirus 2 by RT PCR NEGATIVE NEGATIVE    Comment: (NOTE) SARS-CoV-2 target nucleic acids are NOT DETECTED.  The SARS-CoV-2 RNA is generally detectable in upper respiratory specimens during the acute phase of infection. The lowest concentration of SARS-CoV-2 viral copies this assay can detect is 138 copies/mL. A negative result does not preclude SARS-Cov-2 infection and should not be used as the  sole basis for treatment or other patient management decisions. A negative result may occur with  improper specimen collection/handling, submission of specimen other than nasopharyngeal swab, presence of viral mutation(s) within the areas targeted by this assay, and inadequate number of viral copies(<138 copies/mL). A  negative result must be combined with clinical observations, patient history, and epidemiological information. The expected result is Negative.  Fact Sheet for Patients:  BloggerCourse.com  Fact Sheet for Healthcare Providers:  SeriousBroker.it  This test is no t yet approved or cleared by the [place] FDA and  has been authorized for detection and/or diagnosis of SARS-CoV-2 by FDA under an Emergency Use Authorization (EUA). This EUA will remain  in effect (meaning this test can be used) for the duration of the COVID-19 declaration under Section 564(b)(1) of the Act, 21 U.S.C.section 360bbb-3(b)(1), unless the authorization is terminated  or revoked sooner.       Influenza A by PCR NEGATIVE NEGATIVE   Influenza B by PCR NEGATIVE NEGATIVE    Comment: (NOTE) The Xpert Xpress SARS-CoV-2/FLU/RSV plus assay is intended as an aid in the diagnosis of influenza from Nasopharyngeal swab specimens and should not be used as a sole basis for treatment. Nasal washings and aspirates are unacceptable for Xpert Xpress SARS-CoV-2/FLU/RSV testing.  Fact Sheet for Patients: BloggerCourse.com  Fact Sheet for Healthcare Providers: SeriousBroker.it  This test is not yet approved or cleared by the [place] FDA and has been authorized for detection and/or diagnosis of SARS-CoV-2 by FDA under an Emergency Use Authorization (EUA). This EUA will remain in effect (meaning this test can be used) for the duration of the COVID-19 declaration under Section 564(b)(1) of the Act, 21 U.S.C. section 360bbb-3(b)(1), unless the authorization is terminated or revoked.  Performed at [facility], [address], [place], [place] [address]     Current Facility-Administered Medications  Medication Dose Route Frequency Provider Last Rate Last Admin  . potassium chloride SA (KLOR-CON) CR tablet  40 mEq  40 mEq Oral Once Chesley Noon, MD       Current Outpatient Medications  Medication Sig Dispense Refill  . divalproex (DEPAKOTE ER) 500 MG 24 hr tablet Take 1 tablet (500 mg total) by mouth 3 (three) times daily. 90 tablet 0  . paliperidone (INVEGA SUSTENNA) 234 MG/1.5ML SUSY injection Inject 234 mg into the muscle every 30 (thirty) days.    Marland Kitchen QUEtiapine (SEROQUEL) 200 MG tablet Take 1 tablet (200 mg total) by mouth at bedtime. 30 tablet 0  . traZODone (DESYREL) 50 MG tablet Take 1 tablet (50 mg total) by mouth at bedtime as needed for sleep. 30 tablet 0    Musculoskeletal: Strength & Muscle Tone: within normal limits Gait & Station: normal Patient leans: N/A  Psychiatric Specialty Exam:  Presentation  General Appearance: Bizarre; Disheveled  Eye Contact:Minimal  Speech:Pressured  Speech Volume:Normal  Handedness:Right   Mood and Affect  Mood:Anxious; Irritable; Labile  Affect:Inappropriate   Thought Process  Thought Processes:Disorganized  Descriptions of Associations:Loose  Orientation:Full (Time, Place and Person)  Thought Content:Illogical  History of Schizophrenia/Schizoaffective disorder:Yes  Duration of Psychotic Symptoms:Greater than six months  Hallucinations:Hallucinations: Auditory  Ideas of Reference:None  Suicidal Thoughts:Suicidal Thoughts: No  Homicidal Thoughts:Homicidal Thoughts: No   Sensorium  Memory:Immediate Fair  Judgment:Fair  Insight:Fair   Executive Functions  Concentration:Fair  Attention Span:Fair  Recall:Fair  Fund of Knowledge:Fair  Language:Fair   Psychomotor Activity  Psychomotor Activity:Psychomotor Activity: Normal   Assets  Assets:Communication Skills; Desire for Improvement; Leisure Time; Social  Support   Sleep  Sleep:Sleep: Fair   Physical Exam: Physical Exam Vitals and nursing note reviewed.  HENT:     Head: Normocephalic and atraumatic.     Nose: Nose normal.  Eyes:      Pupils: Pupils are equal, round, and reactive to light.  Pulmonary:     Effort: Pulmonary effort is normal.  Musculoskeletal:        General: Normal range of motion.     Cervical back: Normal range of motion.  Skin:    General: Skin is warm.  Neurological:     General: No focal deficit present.     Mental Status: He is alert and oriented to person, place, and time.  Psychiatric:        Attention and Perception: Attention normal. He perceives auditory hallucinations.        Mood and Affect: Mood is elated.        Speech: Speech normal.        Behavior: Behavior is agitated and hyperactive.        Thought Content: Thought content normal.        Cognition and Memory: Cognition and memory normal.        Judgment: Judgment is impulsive.    Review of Systems  Psychiatric/Behavioral: Positive for hallucinations. Negative for memory loss and suicidal ideas.  All other systems reviewed and are negative.  Blood pressure (!) 143/79, pulse 93, temperature 98.3 F (36.8 C), temperature source Oral, resp. rate 14, SpO2 94 %. There is no height or weight on file to calculate BMI.  Disposition: No evidence of imminent risk to self or others at present.   Patient does not meet criteria for psychiatric inpatient admission. Discussed crisis plan, support from social network, calling 911, coming to the Emergency Department, and calling Suicide Hotline. DC in the am after contacting his act team  Jearld Lesch, NP 05/13/2020 2:03 AM

## 2020-05-13 NOTE — ED Provider Notes (Signed)
I am asked to come to the bedside to evaluate the patient after he has changed his clothes and requested discharge.  While he initially indicates that he does not have any vertigo, he does relent and says he has a place to stay in [place] but does not want to go back.  We gave him a phone and he calls his ACT team, who agrees that he is a place to stay and urged him to return to that place.  He is ultimately agreeable.  We provide him extra meal tray and OJ for the road and we discussed return precautions for the ED.  I provide him with follow-up information for [facility] services.  Patient ambulates out in no acute distress.   Delton Prairie, MD 05/13/20 1041

## 2020-05-14 ENCOUNTER — Inpatient Hospital Stay
Admission: RE | Admit: 2020-05-14 | Discharge: 2020-05-18 | DRG: 885 | Disposition: A | Discharge status: Home / Self Care | Payer: Medicare HMO | Source: Intra-hospital | Attending: Behavioral Health | Admitting: Behavioral Health

## 2020-05-14 ENCOUNTER — Encounter: Payer: Self-pay | Admitting: Psychiatry

## 2020-05-14 ENCOUNTER — Other Ambulatory Visit: Payer: Self-pay

## 2020-05-14 DIAGNOSIS — J45909 Unspecified asthma, uncomplicated: Secondary | ICD-10-CM | POA: Diagnosis not present

## 2020-05-14 DIAGNOSIS — R440 Auditory hallucinations: Secondary | ICD-10-CM | POA: Diagnosis not present

## 2020-05-14 DIAGNOSIS — R443 Hallucinations, unspecified: Secondary | ICD-10-CM | POA: Insufficient documentation

## 2020-05-14 DIAGNOSIS — R7303 Prediabetes: Secondary | ICD-10-CM | POA: Diagnosis not present

## 2020-05-14 DIAGNOSIS — Z5901 Sheltered homelessness: Secondary | ICD-10-CM | POA: Diagnosis not present

## 2020-05-14 DIAGNOSIS — Z79899 Other long term (current) drug therapy: Secondary | ICD-10-CM | POA: Diagnosis not present

## 2020-05-14 DIAGNOSIS — R45851 Suicidal ideations: Secondary | ICD-10-CM | POA: Diagnosis present

## 2020-05-14 DIAGNOSIS — F17210 Nicotine dependence, cigarettes, uncomplicated: Secondary | ICD-10-CM | POA: Diagnosis present

## 2020-05-14 DIAGNOSIS — F17200 Nicotine dependence, unspecified, uncomplicated: Secondary | ICD-10-CM | POA: Diagnosis present

## 2020-05-14 DIAGNOSIS — F1220 Cannabis dependence, uncomplicated: Secondary | ICD-10-CM | POA: Diagnosis present

## 2020-05-14 DIAGNOSIS — Z888 Allergy status to other drugs, medicaments and biological substances status: Secondary | ICD-10-CM | POA: Diagnosis not present

## 2020-05-14 DIAGNOSIS — F250 Schizoaffective disorder, bipolar type: Principal | ICD-10-CM | POA: Diagnosis present

## 2020-05-14 DIAGNOSIS — Z1339 Encounter for screening examination for other mental health and behavioral disorders: Secondary | ICD-10-CM | POA: Diagnosis not present

## 2020-05-14 DIAGNOSIS — Z885 Allergy status to narcotic agent status: Secondary | ICD-10-CM

## 2020-05-14 DIAGNOSIS — Z20822 Contact with and (suspected) exposure to covid-19: Secondary | ICD-10-CM | POA: Diagnosis not present

## 2020-05-14 MED ORDER — QUETIAPINE FUMARATE 200 MG PO TABS
200.0000 mg | ORAL_TABLET | Freq: Every day | ORAL | Status: DC
  Administered 2020-05-14 – 2020-05-17 (×4): 200 mg via ORAL
  Filled 2020-05-14 (×5): qty 1

## 2020-05-14 MED ORDER — MAGNESIUM HYDROXIDE 400 MG/5ML PO SUSP: 30.0000 mL | Freq: Every day | ORAL | Status: DC | PRN

## 2020-05-14 MED ORDER — IBUPROFEN 400 MG PO TABS
400.0000 mg | ORAL_TABLET | Freq: Once | ORAL | Status: AC
  Administered 2020-05-14: 400 mg via ORAL
  Filled 2020-05-14: qty 1

## 2020-05-14 MED ORDER — TRAZODONE HCL 50 MG PO TABS
50.0000 mg | ORAL_TABLET | Freq: Every evening | ORAL | Status: DC | PRN
Start: 2020-05-14 — End: 2020-05-18
  Administered 2020-05-14 – 2020-05-17 (×4): 50 mg via ORAL
  Filled 2020-05-14 (×4): qty 1

## 2020-05-14 MED ORDER — HYDROXYZINE HCL 50 MG PO TABS
50.0000 mg | ORAL_TABLET | Freq: Three times a day (TID) | ORAL | Status: DC | PRN
  Administered 2020-05-15 – 2020-05-17 (×5): 50 mg via ORAL
  Filled 2020-05-14 (×6): qty 1

## 2020-05-14 MED ORDER — ACETAMINOPHEN 325 MG PO TABS
650.0000 mg | ORAL_TABLET | Freq: Four times a day (QID) | ORAL | Status: DC | PRN
  Administered 2020-05-15 – 2020-05-17 (×6): 650 mg via ORAL
  Filled 2020-05-14 (×7): qty 2

## 2020-05-14 MED ORDER — ALUM & MAG HYDROXIDE-SIMETH 200-200-20 MG/5ML PO SUSP
30.0000 mL | ORAL | Status: DC | PRN
Start: 2020-05-14 — End: 2020-05-18

## 2020-05-14 MED ORDER — NICOTINE 21 MG/24HR TD PT24
21.0000 mg | MEDICATED_PATCH | Freq: Every day | TRANSDERMAL | Status: DC
  Administered 2020-05-14: 21 mg via TRANSDERMAL
  Filled 2020-05-14: qty 1

## 2020-05-14 MED ORDER — DIVALPROEX SODIUM ER 500 MG PO TB24
500.0000 mg | ORAL_TABLET | Freq: Three times a day (TID) | ORAL | Status: DC
  Administered 2020-05-14 – 2020-05-18 (×12): 500 mg via ORAL
  Filled 2020-05-14 (×12): qty 1

## 2020-05-14 NOTE — BH Assessment (Signed)
Comprehensive Clinical Assessment (CCA) Note  05/14/2020 Kenneth Jensen [id number] Consulted with Kenneth Pigg., NP, who determined pt. meets psychiatric inpatient criteria. Notified Dr. Erma Jensen and Kenneth Lemmings, RN of disposition recommendation.  Pt initially refused to talk stating, "I just took Seroquel; however once further engaged, pt cooperated with the assessment. Pt had an unremarkable appearance. Pt was drowsy and oriented x5. Pt's speech was slurred and tangential. Pt's thought processes were loosely relevant to the questions asked. Motor behavior appeared normal. Pt avoided eye contact and kept his eyes closed throughout the assessment. Pt's mood is irritable; affect is tense. Pt reported having auditory hallucinations that tell him that he is nothing and will never be anything. Pt identified his main problem as not liking his living situation. Pt became noticeably agitated when informed that the ER is not a holding facility. Pt currently denies NSSIB, SI, HI, and V/H. Pt requested that the assessment be terminated prematurely due to increased agitation.  Flowsheet Row ED from 05/13/2020 in [facility] EMERGENCY DEPARTMENT ED from 05/12/2020 in [facility] EMERGENCY DEPARTMENT ED from 05/19/2019 in [facility]  C-SSRS RISK CATEGORY High Risk No Risk Low Risk    The patient demonstrates the following risk factors for suicide: Chronic risk factors for suicide include: psychiatric disorder of schizoaffective disorder, bipolar type.  Acute risk factors for suicide include: N/A. Protective factors for this patient include: hope for the future. Considering these factors, the overall suicide risk at this point appears to be high. Patient is not appropriate for outpatient follow up.  Chief Complaint:  Chief Complaint  Patient presents with  . Suicidal   Visit Diagnosis: Schizoaffective disorder, bipolar type     CCA  Screening, Triage and Referral (STR)  Patient Reported Information How did you hear about [place]? Self  Referral name: Self  Referral phone number: No data recorded  Whom do you see for routine medical problems? I don't have a doctor  Practice/Facility Name: No data recorded Practice/Facility Phone Number: No data recorded Name of Contact: No data recorded Contact Number: No data recorded Contact Fax Number: No data recorded Prescriber Name: No data recorded Prescriber Address (if known): No data recorded  What Is the Reason for Your Visit/Call Today? Auditory hallucinations  How Long Has This Been Causing You Problems? > than 6 months  What Do You Feel Would Help You the Most Today? Treatment for Depression or other mood problem   Have You Recently Been in Any Inpatient Treatment (Hospital/Detox/Crisis Center/28-Day Program)? No  Name/Location of Program/Hospital:No data recorded How Long Were You There? No data recorded When Were You Discharged? No data recorded  Have You Ever Received Services From [facility] Before? Yes  Who Do You See at [facility]? Inpatient treatment   Have You Recently Had Any Thoughts About Hurting Yourself? Yes  Are You Planning to Commit Suicide/Harm Yourself At This time? No   Have you Recently Had Thoughts About Hurting Someone Kenneth Jensen? No  Explanation: No data recorded  Have You Used Any Alcohol or Drugs in the Past 24 Hours? Yes  How Long Ago Did You Use Drugs or Alcohol? No data recorded What Did You Use and How Much? Cannabanoid; unable to quantify the amount   Do You Currently Have a Therapist/Psychiatrist? Yes  Name of Therapist/Psychiatrist: [facility]   Have You Been Recently Discharged From Any Office Practice or Programs? No  Explanation of Discharge From Practice/Program: No data recorded  CCA Screening Triage Referral Assessment Type of Contact: Face-to-Face  Is this Initial or Reassessment? No data recorded Date  Telepsych consult ordered in [facility]:  11/05/2019  Time Telepsych consult ordered in [facility]:  0228   Patient Reported Information Reviewed? Yes  Patient Left Without Being Seen? No data recorded Reason for Not Completing Assessment: No data recorded  Collateral Involvement: n/a   Does Patient Have a Court Appointed Legal Guardian? No data recorded Name and Contact of Legal Guardian: Self  If Minor and Not Living with Parent(s), Who has Custody? n/a  Is CPS involved or ever been involved? Never  Is APS involved or ever been involved? Never   Patient Determined To Be At Risk for Harm To Self or Others Based on Review of Patient Reported Information or Presenting Complaint? No  Method: No data recorded Availability of Means: No data recorded Intent: No data recorded Notification Required: No data recorded Additional Information for Danger to Others Potential: No data recorded Additional Comments for Danger to Others Potential: No data recorded Are There Guns or Other Weapons in Your Home? No data recorded Types of Guns/Weapons: No data recorded Are These Weapons Safely Secured?                            No data recorded Who Could Verify You Are Able To Have These Secured: No data recorded Do You Have any Outstanding Charges, Pending Court Dates, Parole/Probation? No data recorded Contacted To Inform of Risk of Harm To Self or Others: No data recorded  Location of Assessment: [facility] ED   Does Patient Present under Involuntary Commitment? No  IVC Papers Initial File Date: No data recorded  [place] of Residence: [place]   Patient Currently Receiving the Following Services: ACTT Psychologist, educational)   Determination of Need: Emergent (2 hours)   Options For Referral: Inpatient Hospitalization     CCA Biopsychosocial Intake/Chief Complaint:  Patient is presenting to [facility] ED voluntarily due to experiencing AH, patient is presenting paranoid with persecutory  delusions  Current Symptoms/Problems: Patient is presenting to [facility] ED voluntarily due to experiencing AH, pt was recently discharged from the hospital.   Patient Reported Schizophrenia/Schizoaffective Diagnosis in Past: Yes   Strengths: Patient is able to communicate  Preferences: Unknown  Abilities: Pt able to take care of himself   Type of Services Patient Feels are Needed: Inpatient treatment   Initial Clinical Notes/Concerns: None   Mental Health Symptoms Depression:  Worthlessness   Duration of Depressive symptoms: Greater than two weeks   Mania:  Irritability; Racing thoughts   Anxiety:   None   Psychosis:  Delusions; Hallucinations   Duration of Psychotic symptoms: Greater than six months   Trauma:  None   Obsessions:  None   Compulsions:  None   Inattention:  None   Hyperactivity/Impulsivity:  N/A   Oppositional/Defiant Behaviors:  None   Emotional Irregularity:  None   Other Mood/Personality Symptoms:  No data recorded   Mental Status Exam Appearance and self-care  Stature:  Average   Weight:  Overweight   Clothing:  Casual   Grooming:  Neglected   Cosmetic use:  None   Posture/gait:  Normal   Motor activity:  Not Remarkable   Sensorium  Attention:  Distractible   Concentration:  Normal   Orientation:  X5   Recall/memory:  Normal   Affect and Mood  Affect:  Labile   Mood:  Irritable   Relating  Eye contact:  None   Facial expression:  Tense   Attitude toward examiner:  Irritable; Defensive   Thought and Language  Speech flow: Clear and Coherent   Thought content:  Appropriate to Mood and Circumstances   Preoccupation:  Ruminations   Hallucinations:  Auditory   Organization:  No data recorded  Affiliated Computer ServicesExecutive Functions  Fund of Knowledge:  Fair   Intelligence:  Average   Abstraction:  Normal   Judgement:  Fair   Brewing technologisteality Testing:  Realistic   Insight:  Lacking   Decision Making:  Location managerVacilates   Social  Functioning  Social Maturity:  Impulsive   Social Judgement:  Heedless   Stress  Stressors:  Housing   Coping Ability:  Exhausted   Skill Deficits:  None   Supports:  Friends/Service system     Religion: Religion/Spirituality Are You A Religious Person?: No  Leisure/Recreation: Leisure / Recreation Do You Have Hobbies?: No  Exercise/Diet: Exercise/Diet Have You Gained or Lost A Significant Amount of Weight in the Past Six Months?: No Do You Follow a Special Diet?: No Do You Have Any Trouble Sleeping?: No   CCA Employment/Education Employment/Work Situation: Employment / Work Situation Employment situation: On disability Why is patient on disability: Mental Health Diagnosis How long has patient been on disability: Unknown Patient's job has been impacted by current illness: Yes Describe how patient's job has been impacted: Pt reported that he'd left his job due to feeling judged for having schizophrenia. What is the longest time patient has a held a job?: 1 year Where was the patient employed at that time?: [employer] Has patient ever been in the Eli Lilly and Companymilitary?: No  Education: Education Is Patient Currently Attending School?: No Did You Product managerAttend College?: No Did Designer, television/film setYou Attend Graduate School?: No Did You Have An Individualized Education Program (IIEP): No Did You Have Any Difficulty At School?: No Patient's Education Has Been Impacted by Current Illness: No   CCA Family/Childhood History Family and Relationship History: Family history Marital status: Single Are you sexually active?:  (n/a) What is your sexual orientation?: Unknown Has your sexual activity been affected by drugs, alcohol, medication, or emotional stress?: Unknown Does patient have children?: No  Childhood History:  Childhood History By whom was/is the patient raised?: Grandparents Additional childhood history information: None reported Description of patient's relationship with caregiver when they  were a child: None reported Patient's description of current relationship with people who raised him/her: None reported How were you disciplined when you got in trouble as a child/adolescent?: None reported Does patient have siblings?: No Did patient suffer any verbal/emotional/physical/sexual abuse as a child?:  (UTA) Did patient suffer from severe childhood neglect?:  (UTA) Has patient ever been sexually abused/assaulted/raped as an adolescent or adult?:  (UTA) Was the patient ever a victim of a crime or a disaster?:  (UTA) Witnessed domestic violence?:  (UTA) 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)  Child/Adolescent Assessment:     CCA Substance Use Alcohol/Drug Use: Alcohol / Drug Use Pain Medications: See MAR Prescriptions: See MAR Over the Counter: See MAR History of alcohol / drug use?: Yes Longest period of sobriety (when/how long):  (UTA) Negative Consequences of Use: Personal relationships Withdrawal Symptoms:  (None reported) Substance #1 Name of Substance 1: Cocaine Substance #2 Name of Substance 2: Marijuana                     ASAM's:  Six Dimensions of Multidimensional Assessment  Dimension 1:  Acute Intoxication and/or Withdrawal Potential:   Dimension 1:  Description of individual's past and current experiences of substance use and withdrawal: None reported  Dimension 2:  Biomedical Conditions and Complications:   Dimension 2:  Description of patient's biomedical conditions and  complications: None noted  Dimension 3:  Emotional, Behavioral, or Cognitive Conditions and Complications:  Dimension 3:  Description of emotional, behavioral, or cognitive conditions and complications: Schizoaffective disorder, bipolar type  Dimension 4:  Readiness to Change:  Dimension 4:  Description of Readiness to Change criteria:  (UTA)  Dimension 5:  Relapse, Continued use, or Continued Problem Potential:  Dimension 5:  Relapse, continued use,  or continued problem potential critiera description: Pt identified living environment as a severe stressor. (UTA)  Dimension 6:  Recovery/Living Environment:  Dimension 6:  Recovery/Iiving environment criteria description: Pt identified living environment as a severe stressor.  ASAM Severity Score: ASAM's Severity Rating Score: 12  ASAM Recommended Level of Treatment: ASAM Recommended Level of Treatment: Level I Outpatient Treatment   Substance use Disorder (SUD) Substance Use Disorder (SUD)  Checklist Symptoms of Substance Use: Continued use despite having a persistent/recurrent physical/psychological problem caused/exacerbated by use,Continued use despite persistent or recurrent social, interpersonal problems, caused or exacerbated by use  Recommendations for Services/Supports/Treatments: Recommendations for Services/Supports/Treatments Recommendations For Services/Supports/Treatments: Inpatient Hospitalization  DSM5 Diagnoses: Patient Active Problem List   Diagnosis Date Noted  . Auditory hallucinations   . Schizoaffective disorder, bipolar type (HCC) 03/25/2019    Akoni Parton R [place], LCAS

## 2020-05-14 NOTE — H&P (Addendum)
Psychiatric Admission Assessment Adult  Patient Identification: Kenneth Jensen MRN:  [id number] Date of Evaluation:  05/14/2020 Chief Complaint:  Schizoaffective disorder, bipolar type (HCC) [F25.0] Principal Diagnosis: Schizoaffective disorder, bipolar type (HCC) Diagnosis:  Principal Problem:   Schizoaffective disorder, bipolar type (HCC) Active Problems:   Cannabis use disorder, moderate, dependence (HCC)   Tobacco use disorder  CC "I'm hearing voices."  History of Present Illness: 30 year old male with schizoaffective disorder presenting voluntarily to emergency room for suicidal ideations. He reports auditory hallucinations of voices telling him he's worthless and going to be homeless forever, and that he is going to start smoking crack. He denies any visual hallucinations or homicidal ideations. He has some passive suicidal ideations currently mostly regarding his homelessness situation. He has been staying with a friend in [place], but discusses some vaguely paranoid content about her stealing money and taking advantage of him. He also has some delusions of supernatural phenomenon happening in the town. Specifically mentions an abnormally large dog in [place] that is after him. Per patient he received [facility] last week.   Contacted Terrance from ACT Team [phone]: No answer, HIPPA compliant message left  Also contacted patient's payee with him present per request at [phone]. No answer, message left requesting call back.   Associated Signs/Symptoms: Depression Symptoms:  depressed mood, feelings of worthlessness/guilt, difficulty concentrating, hopelessness, recurrent thoughts of death, disturbed sleep, Duration of Depression Symptoms: Greater than two weeks  (Hypo) Manic Symptoms:  Delusions, Hallucinations, Impulsivity, Labiality of Mood, Anxiety Symptoms:  Excessive Worry, Psychotic Symptoms:  Delusions, Hallucinations: Auditory Paranoia, PTSD  Symptoms: Negative Total Time spent with patient: 1 hour  Past Psychiatric History: Currently followed by [facility] ACT team. History of prior admissions. Current medications include Depakote, invega sustenna 234 mg IM injection, seroquel, and trazodone.  He denies any history of suicide attempts. Does have some history of fighting and aggression when he is decompensated. Smokes mariajuana and cigarettes, denies any other substance abuse.   Is the patient at risk to self? Yes.    Has the patient been a risk to self in the past 6 months? No.  Has the patient been a risk to self within the distant past? No.  Is the patient a risk to others? No.  Has the patient been a risk to others in the past 6 months? No.  Has the patient been a risk to others within the distant past? Yes.     Prior Inpatient Therapy:   Prior Outpatient Therapy:    Alcohol Screening: 1. How often do you have a drink containing alcohol?: Monthly or less 2. How many drinks containing alcohol do you have on a typical day when you are drinking?: 1 or 2 3. How often do you have six or more drinks on one occasion?: Never AUDIT-C Score: 1 4. How often during the last year have you found that you were not able to stop drinking once you had started?: Never 5. How often during the last year have you failed to do what was normally expected from you because of drinking?: Never 6. How often during the last year have you needed a first drink in the morning to get yourself going after a heavy drinking session?: Never 7. How often during the last year have you had a feeling of guilt of remorse after drinking?: Never 8. How often during the last year have you been unable to remember what happened the night before because you had been drinking?: Never 9. Have you  or someone else been injured as a result of your drinking?: No 10. Has a relative or friend or a doctor or another health worker been concerned about your drinking or suggested you cut  down?: No Alcohol Use Disorder Identification Test Final Score (AUDIT): 1 Substance Abuse History in the last 12 months:  Yes.   Consequences of Substance Abuse: Worsening mental health Previous Psychotropic Medications: Yes  Psychological Evaluations: Yes  Past Medical History:  Past Medical History:  Diagnosis Date  . Asthma   . Bipolar 1 disorder (HCC)   . Depression   . Schizophrenia (HCC)   . Schizotaxia    History reviewed. No pertinent surgical history. Family History: History reviewed. No pertinent family history. Family Psychiatric  History: None reported Tobacco Screening: Have you used any form of tobacco in the last 30 days? (Cigarettes, Smokeless Tobacco, Cigars, and/or Pipes): Yes Tobacco use, Select all that apply: 5 or more cigarettes per day Are you interested in Tobacco Cessation Medications?: No, patient refused Counseled patient on smoking cessation including recognizing danger situations, developing coping skills and basic information about quitting provided: Yes Social History:  Social History   Substance and Sexual Activity  Alcohol Use Yes   Comment: social     Social History   Substance and Sexual Activity  Drug Use Yes  . Types: Marijuana    Additional Social History:                           Allergies:   Allergies  Allergen Reactions  . Haldol [Haloperidol]     Pt states "I went crazy"  . Percocet [Oxycodone-Acetaminophen]   . Vicodin [Hydrocodone-Acetaminophen] Itching  . Vicodin [Hydrocodone-Acetaminophen]    Lab Results:  Results for orders placed or performed during the hospital encounter of 05/12/20 (from the past 48 hour(s))  Comprehensive metabolic panel     Status: Abnormal   Collection Time: 05/12/20  9:41 PM  Result Value Ref Range   Sodium 138 135 - 145 mmol/L   Potassium 3.2 (L) 3.5 - 5.1 mmol/L   Chloride 102 98 - 111 mmol/L   CO2 26 22 - 32 mmol/L   Glucose, Bld 126 (H) 70 - 99 mg/dL    Comment: Glucose  reference range applies only to samples taken after fasting for at least 8 hours.   BUN 9 6 - 20 mg/dL   Creatinine, Ser 4.090.93 0.61 - 1.24 mg/dL   Calcium 8.7 (L) 8.9 - 10.3 mg/dL   Total Protein 7.4 6.5 - 8.1 g/dL   Albumin 4.1 3.5 - 5.0 g/dL   AST 19 15 - 41 U/L   ALT 11 0 - 44 U/L   Alkaline Phosphatase 43 38 - 126 U/L   Total Bilirubin 0.7 0.3 - 1.2 mg/dL   GFR, Estimated >81>60 >19>60 mL/min    Comment: (NOTE) Calculated using the CKD-EPI Creatinine Equation (2021)    Anion gap 10 5 - 15    Comment: Performed at [facility], [address] [place], [place] [address]  Ethanol     Status: None   Collection Time: 05/12/20  9:41 PM  Result Value Ref Range   Alcohol, Ethyl (B) <10 <10 mg/dL    Comment: (NOTE) Lowest detectable limit for serum alcohol is 10 mg/dL.  For medical purposes only. Performed at [facility], [address] [place], [place] [address]   Salicylate level     Status: Abnormal   Collection  Time: 05/12/20  9:41 PM  Result Value Ref Range   Salicylate Lvl <7.0 (L) 7.0 - 30.0 mg/dL    Comment: Performed at [facility], [address] [place], [place] [address]  Acetaminophen level     Status: Abnormal   Collection Time: 05/12/20  9:41 PM  Result Value Ref Range   Acetaminophen (Tylenol), Serum <10 (L) 10 - 30 ug/mL    Comment: (NOTE) Therapeutic concentrations vary significantly. A range of 10-30 ug/mL  may be an effective concentration for many patients. However, some  are best treated at concentrations outside of this range. Acetaminophen concentrations >150 ug/mL at 4 hours after ingestion  and >50 ug/mL at 12 hours after ingestion are often associated with  toxic reactions.  Performed at [facility], [address] [place], [place] [address]   cbc     Status: Abnormal   Collection Time: 05/12/20  9:41 PM  Result Value Ref Range   WBC 14.7 (H) 4.0 - 10.5 K/uL   RBC 4.17 (L) 4.22 - 5.81 MIL/uL   Hemoglobin  12.8 (L) 13.0 - 17.0 g/dL   HCT 30.8 (L) 65.7 - 84.6 %   MCV 90.2 80.0 - 100.0 fL   MCH 30.7 26.0 - 34.0 pg   MCHC 34.0 30.0 - 36.0 g/dL   RDW 96.2 95.2 - 84.1 %   Platelets 270 150 - 400 K/uL   nRBC 0.0 0.0 - 0.2 %    Comment: Performed at [facility], [address] [place], [place] [address]  Valproic acid level     Status: None   Collection Time: 05/12/20  9:41 PM  Result Value Ref Range   Valproic Acid Lvl 84 50.0 - 100.0 ug/mL    Comment: Performed at [facility], [address], [place], [place] [address]  Urine Drug Screen, Qualitative     Status: Abnormal   Collection Time: 05/12/20  9:58 PM  Result Value Ref Range   Tricyclic, Ur Screen NONE DETECTED NONE DETECTED   Amphetamines, Ur Screen NONE DETECTED NONE DETECTED   MDMA (Ecstasy)Ur Screen NONE DETECTED NONE DETECTED   Cocaine Metabolite,Ur [place] NONE DETECTED NONE DETECTED   Opiate, Ur Screen NONE DETECTED NONE DETECTED   Phencyclidine (PCP) Ur S NONE DETECTED NONE DETECTED   Cannabinoid 50 Ng, Ur [place] POSITIVE (A) NONE DETECTED   Barbiturates, Ur Screen NONE DETECTED NONE DETECTED   Benzodiazepine, Ur Scrn NONE DETECTED NONE DETECTED   Methadone Scn, Ur NONE DETECTED NONE DETECTED    Comment: (NOTE) Tricyclics + metabolites, urine    Cutoff 1000 ng/mL Amphetamines + metabolites, urine  Cutoff 1000 ng/mL MDMA (Ecstasy), urine              Cutoff 500 ng/mL Cocaine Metabolite, urine          Cutoff 300 ng/mL Opiate + metabolites, urine        Cutoff 300 ng/mL Phencyclidine (PCP), urine         Cutoff 25 ng/mL Cannabinoid, urine                 Cutoff 50 ng/mL Barbiturates + metabolites, urine  Cutoff 200 ng/mL Benzodiazepine, urine              Cutoff 200 ng/mL Methadone, urine                   Cutoff 300 ng/mL  The urine drug screen provides only a preliminary, unconfirmed analytical test result and  should not be used for non-medical purposes. Clinical consideration and professional judgment  should be applied to any positive drug screen result due to possible interfering substances. A more specific alternate chemical method must be used in order to obtain a confirmed analytical result. Gas chromatography / mass spectrometry (GC/MS) is the preferred confirm atory method. Performed at [facility], [address] [place], [place] [address]   Resp Panel by RT-PCR (Flu A&B, Covid) Nasopharyngeal Swab     Status: None   Collection Time: 05/12/20 10:15 PM   Specimen: Nasopharyngeal Swab; Nasopharyngeal(NP) swabs in vial transport medium  Result Value Ref Range   SARS Coronavirus 2 by RT PCR NEGATIVE NEGATIVE    Comment: (NOTE) SARS-CoV-2 target nucleic acids are NOT DETECTED.  The SARS-CoV-2 RNA is generally detectable in upper respiratory specimens during the acute phase of infection. The lowest concentration of SARS-CoV-2 viral copies this assay can detect is 138 copies/mL. A negative result does not preclude SARS-Cov-2 infection and should not be used as the sole basis for treatment or other patient management decisions. A negative result may occur with  improper specimen collection/handling, submission of specimen other than nasopharyngeal swab, presence of viral mutation(s) within the areas targeted by this assay, and inadequate number of viral copies(<138 copies/mL). A negative result must be combined with clinical observations, patient history, and epidemiological information. The expected result is Negative.  Fact Sheet for Patients:  BloggerCourse.com  Fact Sheet for Healthcare Providers:  SeriousBroker.it  This test is no t yet approved or cleared by the [place] FDA and  has been authorized for detection and/or diagnosis of SARS-CoV-2 by FDA under an Emergency Use Authorization (EUA). This EUA will remain  in effect (meaning this test can be used) for the duration of the COVID-19 declaration  under Section 564(b)(1) of the Act, 21 U.S.C.section 360bbb-3(b)(1), unless the authorization is terminated  or revoked sooner.       Influenza A by PCR NEGATIVE NEGATIVE   Influenza B by PCR NEGATIVE NEGATIVE    Comment: (NOTE) The Xpert Xpress SARS-CoV-2/FLU/RSV plus assay is intended as an aid in the diagnosis of influenza from Nasopharyngeal swab specimens and should not be used as a sole basis for treatment. Nasal washings and aspirates are unacceptable for Xpert Xpress SARS-CoV-2/FLU/RSV testing.  Fact Sheet for Patients: BloggerCourse.com  Fact Sheet for Healthcare Providers: SeriousBroker.it  This test is not yet approved or cleared by the [place] FDA and has been authorized for detection and/or diagnosis of SARS-CoV-2 by FDA under an Emergency Use Authorization (EUA). This EUA will remain in effect (meaning this test can be used) for the duration of the COVID-19 declaration under Section 564(b)(1) of the Act, 21 U.S.C. section 360bbb-3(b)(1), unless the authorization is terminated or revoked.  Performed at [facility], [address] [place], [place] [address]     Blood Alcohol level:  Lab Results  Component Value Date   [facility] <10 05/12/2020   ETH <10 11/04/2019    Metabolic Disorder Labs:  No results found for: HGBA1C, MPG No results found for: PROLACTIN Lab Results  Component Value Date   CHOL 134 08/09/2018   TRIG 99 08/09/2018   HDL 39 (L) 08/09/2018   CHOLHDL 3.4 08/09/2018   VLDL 20 08/09/2018   LDLCALC 75 08/09/2018    Current Medications: Current Facility-Administered Medications  Medication Dose Route Frequency Provider Last Rate Last Admin  . acetaminophen (TYLENOL) tablet 650 mg  650 mg Oral Q6H PRN Clapacs, John  T, MD      . alum & mag hydroxide-simeth (MAALOX/MYLANTA) 200-200-20 MG/5ML suspension 30 mL  30 mL Oral Q4H PRN Clapacs, John T, MD      . divalproex (DEPAKOTE ER)  24 hr tablet 500 mg  500 mg Oral TID Clapacs, John T, MD      . hydrOXYzine (ATARAX/VISTARIL) tablet 50 mg  50 mg Oral TID PRN Clapacs, John T, MD      . magnesium hydroxide (MILK OF MAGNESIA) suspension 30 mL  30 mL Oral Daily PRN Clapacs, John T, MD      . nicotine (NICODERM CQ - dosed in mg/24 hours) patch 21 mg  21 mg Transdermal Daily Clapacs, John T, MD      . QUEtiapine (SEROQUEL) tablet 200 mg  200 mg Oral QHS Clapacs, John T, MD      . traZODone (DESYREL) tablet 50 mg  50 mg Oral QHS PRN Clapacs, Jackquline [place], MD       PTA Medications: Medications Prior to Admission  Medication Sig Dispense Refill Last Dose  . divalproex (DEPAKOTE ER) 500 MG 24 hr tablet Take 1 tablet (500 mg total) by mouth 3 (three) times daily. 90 tablet 0   . paliperidone (INVEGA SUSTENNA) 234 MG/1.5ML SUSY injection Inject 234 mg into the muscle every 30 (thirty) days.     Marland Kitchen QUEtiapine (SEROQUEL) 200 MG tablet Take 1 tablet (200 mg total) by mouth at bedtime. 30 tablet 0   . traZODone (DESYREL) 50 MG tablet Take 1 tablet (50 mg total) by mouth at bedtime as needed for sleep. 30 tablet 0     Musculoskeletal: Strength & Muscle Tone: within normal limits Gait & Station: normal Patient leans: N/A            Psychiatric Specialty Exam:  Presentation  General Appearance: Fairly Groomed  Eye Contact:Good  Speech:Normal Rate  Speech Volume:Increased  Handedness:Right   Mood and Affect  Mood:Irritable; Anxious  Affect:Congruent   Thought Process  Thought Processes:Disorganized  Duration of Psychotic Symptoms: Greater than six months  Past Diagnosis of Schizophrenia or Psychoactive disorder: Yes  Descriptions of Associations:Loose  Orientation:Full (Time, Place and Person)  Thought Content:Paranoid Ideation; Delusions; Illogical  Hallucinations:Hallucinations: Auditory Description of Auditory Hallucinations: Voices telling him he will be nothing  Ideas of Reference:Paranoia  Suicidal  Thoughts:Suicidal Thoughts: No  Homicidal Thoughts:Homicidal Thoughts: No HI Passive Intent and/or Plan: Without Intent   Sensorium  Memory:Immediate Fair; Recent Fair; Remote Fair  Judgment:Poor  Insight:Shallow   Executive Functions  Concentration:Fair  Attention Span:Fair  Recall:Fair  Fund of Knowledge:Fair  Language:Fair   Psychomotor Activity  Psychomotor Activity:Psychomotor Activity: Normal   Assets  Assets:Communication Skills; Desire for Improvement; Financial Resources/Insurance; Physical Health; Resilience; Social Support   Sleep  Sleep:Sleep: Fair    Physical Exam: Physical Exam Vitals and nursing note reviewed.  Constitutional:      Appearance: Normal appearance.  HENT:     Head: Normocephalic and atraumatic.     Right Ear: External ear normal.     Left Ear: External ear normal.     Nose: Nose normal.     Mouth/Throat:     Mouth: Mucous membranes are moist.     Pharynx: Oropharynx is clear.  Eyes:     Extraocular Movements: Extraocular movements intact.     Conjunctiva/sclera: Conjunctivae normal.     Pupils: Pupils are equal, round, and reactive to light.  Cardiovascular:     Rate and Rhythm: Normal rate.  Pulses: Normal pulses.  Pulmonary:     Effort: Pulmonary effort is normal.     Breath sounds: Normal breath sounds.  Abdominal:     General: Abdomen is flat.     Palpations: Abdomen is soft.  Musculoskeletal:        General: No swelling. Normal range of motion.     Cervical back: Normal range of motion and neck supple.  Skin:    General: Skin is warm and dry.  Neurological:     General: No focal deficit present.     Mental Status: He is alert and oriented to person, place, and time.  Psychiatric:        Attention and Perception: He perceives auditory hallucinations.        Mood and Affect: Affect normal. Mood is anxious.        Speech: Speech normal.        Behavior: Behavior is cooperative.        Thought Content:  Thought content is paranoid. Thought content includes suicidal ideation.        Cognition and Memory: Cognition and memory normal.        Judgment: Judgment is impulsive.    Review of Systems  Constitutional: Negative.   HENT: Negative.   Eyes: Negative.   Respiratory: Negative.   Cardiovascular: Negative.   Gastrointestinal: Negative.   Genitourinary: Negative.   Musculoskeletal: Negative.   Skin: Negative.   Neurological: Negative.   Endo/Heme/Allergies: Positive for environmental allergies. Does not bruise/bleed easily.  Psychiatric/Behavioral: Positive for depression, hallucinations and suicidal ideas. The patient has insomnia.    Blood pressure 127/76, pulse 79, temperature 98.4 F (36.9 C), temperature source Oral, resp. rate 18, height 5\' 5"  (1.651 m), weight 94.8 kg, SpO2 95 %. Body mass index is 34.78 kg/m.  Treatment Plan Summary: Daily contact with patient to assess and evaluate symptoms and progress in treatment and Medication management   30 year old male with schizoaffective disorder, bipolar type presenting with suicidal ideations and auditory hallucinations that are derogatory in nature. Resume Deapktoe 500 mg TID (level 84 on 05/12/20), seroquel 200 mg QHS, and trazodone 50 mg. Will need to verify last Invega Sustenna 234 mg IM injection with ACT Team. Per patient, he received last week. Nicoderm 21 mg patch for tobacco use disorder. Cannabis cessation counseling provided today.   Observation Level/Precautions:  15 minute checks  Laboratory:  lipid panel, hemoglobin a1c  Psychotherapy:    Medications:    Consultations:    Discharge Concerns:    Estimated LOS:  Other:     Physician Treatment Plan for Primary Diagnosis: Schizoaffective disorder, bipolar type (HCC) Long Term Goal(s): Improvement in symptoms so as ready for discharge  Short Term Goals: Ability to identify changes in lifestyle to reduce recurrence of condition will improve, Ability to verbalize  feelings will improve, Ability to disclose and discuss suicidal ideas, Ability to demonstrate self-control will improve, Ability to identify and develop effective coping behaviors will improve, Ability to maintain clinical measurements within normal limits will improve, Compliance with prescribed medications will improve and Ability to identify triggers associated with substance abuse/mental health issues will improve  Physician Treatment Plan for Secondary Diagnosis: Principal Problem:   Schizoaffective disorder, bipolar type (HCC) Active Problems:   Cannabis use disorder, moderate, dependence (HCC)   Tobacco use disorder  Long Term Goal(s): Improvement in symptoms so as ready for discharge  Short Term Goals: Ability to identify changes in lifestyle to reduce recurrence of condition will improve, Ability  to verbalize feelings will improve, Ability to disclose and discuss suicidal ideas, Ability to demonstrate self-control will improve, Ability to identify and develop effective coping behaviors will improve, Ability to maintain clinical measurements within normal limits will improve, Compliance with prescribed medications will improve and Ability to identify triggers associated with substance abuse/mental health issues will improve  I certify that inpatient services furnished can reasonably be expected to improve the patient's condition.    Jesse Sans, MD 5/9/20223:47 PM

## 2020-05-14 NOTE — Consult Note (Signed)
[facility] Face-to-Face Psychiatry Consult   Reason for Consult:  Psych eval Referring Physician:  EDP Patient Identification: Kenneth Jensen MRN:  [id number] Principal Diagnosis: <principal problem not specified> Diagnosis:  Active Problems:   Schizoaffective disorder, bipolar type (HCC)   Total Time spent with patient: 1 hour  Subjective:   Kenneth Jensen is a 30 y.o. male patient admitted with audio hallucinations HPI:    Kenneth Jensen, 30 y.o., male patient presented to [facility].Patient seen by TTS and this provider; chart reviewed and consulted with EDP  on 05/14/20.  On evaluation Kenneth Jensen reports per TTS, Pt initially refused to talk stating, "I just took Seroquel; however once further engaged, pt cooperated with the assessment. Pt had an unremarkable appearance. Pt was drowsy and oriented x5. Pt's speech was slurred and tangential. Pt's thought processes were loosely relevant to the questions asked. Motor behavior appeared normal. Pt avoided eye contact and kept his eyes closed throughout the assessment. Pt's mood is irritable;affect is tense. Pt reported having auditory hallucinations that tell him that he is nothing and will never be anything. Pt identified his main problem as not liking his living situation. Pt became noticeably agitated when informed that the ER is not a holding facility. Pt currently denies NSSIB, SI, HI, and V/H. Pt requested that the assessment be terminated prematurely due to increased agitation.   During evaluation Kenneth Jensen is laying in hall bed he is alert/oriented x 4; aggitated and labile; and mood congruent with affect.  Patient is speaking in a fast and hostile tone  With fair eye contact. His  thought process is tangential; There is no indication that he is currently responding to internal/external stimuli or experiencing delusional thought content.  Patient denies suicidal/self-harm/homicidal ideation. Slight ,  psychosis, and paranoia are evident.  Patient has remained agitated throughout assessment and has answered questions appropriately.   Past Psychiatric History: Schizoaffective disorder  Risk to Self:    Risk to Others:   Prior Inpatient Therapy:   Prior Outpatient Therapy:    Past Medical History:  Past Medical History:  Diagnosis Date  . Asthma   . Bipolar 1 disorder (HCC)   . Depression   . Schizophrenia (HCC)   . Schizotaxia    History reviewed. No pertinent surgical history. Family History: History reviewed. No pertinent family history. Family Psychiatric  History: unknown Social History:  Social History   Substance and Sexual Activity  Alcohol Use Yes   Comment: social     Social History   Substance and Sexual Activity  Drug Use Yes  . Types: Marijuana    Social History   Socioeconomic History  . Marital status: Single    Spouse name: Not on file  . Number of children: Not on file  . Years of education: Not on file  . Highest education level: Not on file  Occupational History  . Not on file  Tobacco Use  . Smoking status: Current Every Day Smoker  . Smokeless tobacco: Never Used  Substance and Sexual Activity  . Alcohol use: Yes    Comment: social  . Drug use: Yes    Types: Marijuana  . Sexual activity: Not on file  Other Topics Concern  . Not on file  Social History Narrative   ** Merged History Encounter **       Social Determinants of Health   Financial Resource Strain: Not on file  Food Insecurity: Not on file  Transportation Needs: Not on  file  Physical Activity: Not on file  Stress: Not on file  Social Connections: Not on file   Additional Social History:    Allergies:   Allergies  Allergen Reactions  . Haldol [Haloperidol]     Pt states "I went crazy"  . Percocet [Oxycodone-Acetaminophen]   . Vicodin [Hydrocodone-Acetaminophen] Itching  . Vicodin [Hydrocodone-Acetaminophen]     Labs:  Results for orders placed or performed  during the hospital encounter of 05/12/20 (from the past 48 hour(s))  Comprehensive metabolic panel     Status: Abnormal   Collection Time: 05/12/20  9:41 PM  Result Value Ref Range   Sodium 138 135 - 145 mmol/L   Potassium 3.2 (L) 3.5 - 5.1 mmol/L   Chloride 102 98 - 111 mmol/L   CO2 26 22 - 32 mmol/L   Glucose, Bld 126 (H) 70 - 99 mg/dL    Comment: Glucose reference range applies only to samples taken after fasting for at least 8 hours.   BUN 9 6 - 20 mg/dL   Creatinine, Ser 1.61 0.61 - 1.24 mg/dL   Calcium 8.7 (L) 8.9 - 10.3 mg/dL   Total Protein 7.4 6.5 - 8.1 g/dL   Albumin 4.1 3.5 - 5.0 g/dL   AST 19 15 - 41 U/L   ALT 11 0 - 44 U/L   Alkaline Phosphatase 43 38 - 126 U/L   Total Bilirubin 0.7 0.3 - 1.2 mg/dL   GFR, Estimated >09 >60 mL/min    Comment: (NOTE) Calculated using the CKD-EPI Creatinine Equation (2021)    Anion gap 10 5 - 15    Comment: Performed at [facility], [address] [place], [place] [address]  Ethanol     Status: None   Collection Time: 05/12/20  9:41 PM  Result Value Ref Range   Alcohol, Ethyl (B) <10 <10 mg/dL    Comment: (NOTE) Lowest detectable limit for serum alcohol is 10 mg/dL.  For medical purposes only. Performed at [facility], [address] [place], [place] [address]   Salicylate level     Status: Abnormal   Collection Time: 05/12/20  9:41 PM  Result Value Ref Range   Salicylate Lvl <7.0 (L) 7.0 - 30.0 mg/dL    Comment: Performed at [facility], [address] [place], [place] [address]  Acetaminophen level     Status: Abnormal   Collection Time: 05/12/20  9:41 PM  Result Value Ref Range   Acetaminophen (Tylenol), Serum <10 (L) 10 - 30 ug/mL    Comment: (NOTE) Therapeutic concentrations vary significantly. A range of 10-30 ug/mL  may be an effective concentration for many patients. However, some  are best treated at concentrations outside of this range. Acetaminophen concentrations >150 ug/mL at  4 hours after ingestion  and >50 ug/mL at 12 hours after ingestion are often associated with  toxic reactions.  Performed at [facility], [address] [place], [place] [address]   cbc     Status: Abnormal   Collection Time: 05/12/20  9:41 PM  Result Value Ref Range   WBC 14.7 (H) 4.0 - 10.5 K/uL   RBC 4.17 (L) 4.22 - 5.81 MIL/uL   Hemoglobin 12.8 (L) 13.0 - 17.0 g/dL   HCT 08.6 (L) 57.8 - 46.9 %   MCV 90.2 80.0 - 100.0 fL   MCH 30.7 26.0 - 34.0 pg   MCHC 34.0 30.0 - 36.0 g/dL   RDW 62.9 52.8 - 41.3 %   Platelets 270  150 - 400 K/uL   nRBC 0.0 0.0 - 0.2 %    Comment: Performed at [facility], [address] [place], [place] [address]  Valproic acid level     Status: None   Collection Time: 05/12/20  9:41 PM  Result Value Ref Range   Valproic Acid Lvl 84 50.0 - 100.0 ug/mL    Comment: Performed at [facility], [address], [place], [place] [address]  Urine Drug Screen, Qualitative     Status: Abnormal   Collection Time: 05/12/20  9:58 PM  Result Value Ref Range   Tricyclic, Ur Screen NONE DETECTED NONE DETECTED   Amphetamines, Ur Screen NONE DETECTED NONE DETECTED   MDMA (Ecstasy)Ur Screen NONE DETECTED NONE DETECTED   Cocaine Metabolite,Ur [place] NONE DETECTED NONE DETECTED   Opiate, Ur Screen NONE DETECTED NONE DETECTED   Phencyclidine (PCP) Ur S NONE DETECTED NONE DETECTED   Cannabinoid 50 Ng, Ur [place] POSITIVE (A) NONE DETECTED   Barbiturates, Ur Screen NONE DETECTED NONE DETECTED   Benzodiazepine, Ur Scrn NONE DETECTED NONE DETECTED   Methadone Scn, Ur NONE DETECTED NONE DETECTED    Comment: (NOTE) Tricyclics + metabolites, urine    Cutoff 1000 ng/mL Amphetamines + metabolites, urine  Cutoff 1000 ng/mL MDMA (Ecstasy), urine              Cutoff 500 ng/mL Cocaine Metabolite, urine          Cutoff 300 ng/mL Opiate + metabolites, urine        Cutoff 300 ng/mL Phencyclidine (PCP), urine         Cutoff 25 ng/mL Cannabinoid, urine                  Cutoff 50 ng/mL Barbiturates + metabolites, urine  Cutoff 200 ng/mL Benzodiazepine, urine              Cutoff 200 ng/mL Methadone, urine                   Cutoff 300 ng/mL  The urine drug screen provides only a preliminary, unconfirmed analytical test result and should not be used for non-medical purposes. Clinical consideration and professional judgment should be applied to any positive drug screen result due to possible interfering substances. A more specific alternate chemical method must be used in order to obtain a confirmed analytical result. Gas chromatography / mass spectrometry (GC/MS) is the preferred confirm atory method. Performed at [facility] [address] [place], [place] [address]   Resp Panel by RT-PCR (Flu A&B, Covid) Nasopharyngeal Swab     Status: None   Collection Time: 05/12/20 10:15 PM   Specimen: Nasopharyngeal Swab; Nasopharyngeal(NP) swabs in vial transport medium  Result Value Ref Range   SARS Coronavirus 2 by RT PCR NEGATIVE NEGATIVE    Comment: (NOTE) SARS-CoV-2 target nucleic acids are NOT DETECTED.  The SARS-CoV-2 RNA is generally detectable in upper respiratory specimens during the acute phase of infection. The lowest concentration of SARS-CoV-2 viral copies this assay can detect is 138 copies/mL. A negative result does not preclude SARS-Cov-2 infection and should not be used as the sole basis for treatment or other patient management decisions. A negative result may occur with  improper specimen collection/handling, submission of specimen other than nasopharyngeal swab, presence of viral mutation(s) within the areas targeted by this assay, and inadequate number of viral copies(<138 copies/mL). A negative result must be combined with clinical observations, patient history, and epidemiological information. The expected result  is Negative.  Fact Sheet for Patients:  BloggerCourse.comhttps://www.fda.gov/media/152166/download  Fact Sheet for  Healthcare Providers:  SeriousBroker.ithttps://www.fda.gov/media/152162/download  This test is no t yet approved or cleared by the [place] FDA and  has been authorized for detection and/or diagnosis of SARS-CoV-2 by FDA under an Emergency Use Authorization (EUA). This EUA will remain  in effect (meaning this test can be used) for the duration of the COVID-19 declaration under Section 564(b)(1) of the Act, 21 U.S.C.section 360bbb-3(b)(1), unless the authorization is terminated  or revoked sooner.       Influenza A by PCR NEGATIVE NEGATIVE   Influenza B by PCR NEGATIVE NEGATIVE    Comment: (NOTE) The Xpert Xpress SARS-CoV-2/FLU/RSV plus assay is intended as an aid in the diagnosis of influenza from Nasopharyngeal swab specimens and should not be used as a sole basis for treatment. Nasal washings and aspirates are unacceptable for Xpert Xpress SARS-CoV-2/FLU/RSV testing.  Fact Sheet for Patients: BloggerCourse.comhttps://www.fda.gov/media/152166/download  Fact Sheet for Healthcare Providers: SeriousBroker.ithttps://www.fda.gov/media/152162/download  This test is not yet approved or cleared by the [place] FDA and has been authorized for detection and/or diagnosis of SARS-CoV-2 by FDA under an Emergency Use Authorization (EUA). This EUA will remain in effect (meaning this test can be used) for the duration of the COVID-19 declaration under Section 564(b)(1) of the Act, 21 U.S.C. section 360bbb-3(b)(1), unless the authorization is terminated or revoked.  Performed at [facility], [address] [place], [place] [address]     Current Facility-Administered Medications  Medication Dose Route Frequency Provider Last Rate Last Admin  . divalproex (DEPAKOTE ER) 24 hr tablet 500 mg  500 mg Oral TID Shaune PollackIsaacs, Cameron, MD      . nicotine (NICODERM CQ - dosed in mg/24 hours) patch 21 mg  21 mg Transdermal Daily Shaune PollackIsaacs, Cameron, MD   21 mg at 05/13/20 1950  . QUEtiapine (SEROQUEL) tablet 200 mg  200 mg Oral QHS  Shaune PollackIsaacs, Cameron, MD   200 mg at 05/13/20 2104  . traZODone (DESYREL) tablet 50 mg  50 mg Oral QHS PRN Shaune PollackIsaacs, Cameron, MD   50 mg at 05/13/20 2104   Current Outpatient Medications  Medication Sig Dispense Refill  . divalproex (DEPAKOTE ER) 500 MG 24 hr tablet Take 1 tablet (500 mg total) by mouth 3 (three) times daily. 90 tablet 0  . paliperidone (INVEGA SUSTENNA) 234 MG/1.5ML SUSY injection Inject 234 mg into the muscle every 30 (thirty) days.    Marland Kitchen. QUEtiapine (SEROQUEL) 200 MG tablet Take 1 tablet (200 mg total) by mouth at bedtime. 30 tablet 0  . traZODone (DESYREL) 50 MG tablet Take 1 tablet (50 mg total) by mouth at bedtime as needed for sleep. 30 tablet 0    Musculoskeletal: Strength & Muscle Tone: within normal limits Gait & Station: normal Patient leans: N/A            Psychiatric Specialty Exam:  Presentation  General Appearance: Bizarre  Eye Contact:Fleeting  Speech:Normal Rate  Speech Volume:Increased  Handedness:Right   Mood and Affect  Mood:Anxious; Irritable; Labile  Affect:Inappropriate   Thought Process  Thought Processes:Disorganized; Irrevelant  Descriptions of Associations:Loose  Orientation:Full (Time, Place and Person)  Thought Content:Illogical  History of Schizophrenia/Schizoaffective disorder:Yes  Duration of Psychotic Symptoms:Greater than six months  Hallucinations:Hallucinations: Auditory; Visual  Ideas of Reference:None  Suicidal Thoughts:Suicidal Thoughts: No  Homicidal Thoughts:Homicidal Thoughts: Yes, Passive HI Passive Intent and/or Plan: Without Intent   Sensorium  Memory:Immediate Poor  Judgment:Poor  Insight:Lacking   Executive Functions  Concentration:Poor  Attention Span:Poor  Recall:Poor  Fund of Knowledge:Poor  Language:Poor   Psychomotor Activity  Psychomotor Activity:Psychomotor Activity: Normal   Assets  Assets:Desire for Improvement   Sleep  Sleep:Sleep: Poor   Physical  Exam: Physical Exam Vitals and nursing note reviewed.  HENT:     Head: Normocephalic and atraumatic.     Nose: Nose normal.     Mouth/Throat:     Mouth: Mucous membranes are dry.  Eyes:     Pupils: Pupils are equal, round, and reactive to light.  Pulmonary:     Effort: Pulmonary effort is normal.  Musculoskeletal:        General: Normal range of motion.     Cervical back: Normal range of motion.  Skin:    General: Skin is warm and dry.  Neurological:     General: No focal deficit present.     Mental Status: He is alert and oriented to person, place, and time.  Psychiatric:        Attention and Perception: He is inattentive.        Mood and Affect: Mood is anxious and depressed. Affect is labile.        Speech: Speech is rapid and pressured.        Behavior: Behavior is agitated, aggressive and combative.        Thought Content: Thought content is paranoid and delusional.        Cognition and Memory: Cognition is impaired. Memory is impaired.        Judgment: Judgment is impulsive and inappropriate.    Review of Systems  Psychiatric/Behavioral: Positive for depression and hallucinations. Negative for substance abuse. The patient has insomnia.   All other systems reviewed and are negative.  Blood pressure (!) 140/93, pulse 73, temperature 97.9 F (36.6 C), resp. rate 18, height 5\' 5"  (1.651 m), weight 97.1 kg, SpO2 100 %. Body mass index is 35.61 kg/m.  Treatment Plan Summary: Daily contact with patient to assess and evaluate symptoms and progress in treatment and Medication management  Disposition: Recommend psychiatric Inpatient admission when medically cleared. Supportive therapy provided about ongoing stressors.  , NP 05/14/2020 5:58 AM

## 2020-05-14 NOTE — BHH Group Notes (Signed)
LCSW Group Therapy Note   05/14/2020 3:32 PM  Type of Therapy and Topic:  Group Therapy:  Overcoming Obstacles   Participation Level:  Did Not Attend   Description of Group:    In this group patients will be encouraged to explore what they see as obstacles to their own wellness and recovery. They will be guided to discuss their thoughts, feelings, and behaviors related to these obstacles. The group will process together ways to cope with barriers, with attention given to specific choices patients can make. Each patient will be challenged to identify changes they are motivated to make in order to overcome their obstacles. This group will be process-oriented, with patients participating in exploration of their own experiences as well as giving and receiving support and challenge from other group members.   Therapeutic Goals: 1. Patient will identify personal and current obstacles as they relate to admission. 2. Patient will identify barriers that currently interfere with their wellness or overcoming obstacles.  3. Patient will identify feelings, thought process and behaviors related to these barriers. 4. Patient will identify two changes they are willing to make to overcome these obstacles:      Summary of Patient Progress X    Therapeutic Modalities:   Cognitive Behavioral Therapy Solution Focused Therapy Motivational Interviewing Relapse Prevention Therapy  Simona Huh R. Algis Greenhouse, MSW, LCSW, LCAS 05/14/2020 3:32 PM

## 2020-05-14 NOTE — ED Notes (Signed)
Repot given to [employer], Charity fundraiser

## 2020-05-14 NOTE — BH Assessment (Signed)
Referral information for Psychiatric Hospitalization faxed to;   [facility] [phone]),   . [facility] ([phone]--[phone])  . [facility] ([phone]---[phone]---[phone]),  . [facility] [phone]),   . [facility] [phone] -or- [phone])  . [facility] [phone]).  [facility] [phone])
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
## 2020-05-14 NOTE — ED Notes (Signed)
VOL, pt moving down to [facility] Room# 305 after 2pm today per TTS.

## 2020-05-14 NOTE — ED Notes (Signed)
VOL moved to [facility] waiting for consult by Dr. Toni Amend to be put in computer [id number]

## 2020-05-14 NOTE — Tx Team (Signed)
Initial Treatment Plan 05/14/2020 3:54 PM Kenneth Jensen FXT:[id number]   PATIENT STRESSORS: Financial difficulties Occupational concerns   PATIENT STRENGTHS: Physical Health   PATIENT IDENTIFIED PROBLEMS: Homelessness  Unable to find employment                   DISCHARGE CRITERIA:  Adequate post-discharge living arrangements Improved stabilization in mood, thinking, and/or behavior Motivation to continue treatment in a less acute level of care  PRELIMINARY DISCHARGE PLAN: Placement in alternative living arrangements  PATIENT/FAMILY INVOLVEMENT: This treatment plan has been presented to and reviewed with the patient, Kenneth Jensen.The patient has been given the opportunity to ask questions and make suggestions.  Celene Kras, RN 05/14/2020, 3:54 PM

## 2020-05-14 NOTE — Consult Note (Signed)
[facility] Face-to-Face Psychiatry Consult   Reason for Consult: Consult follow-up 30 year old man with schizoaffective disorder presents to the emergency room reporting suicidal ideation Referring Physician: Roxan Hockey Patient Identification: Kenneth Jensen MRN:  [id number] Principal Diagnosis: Schizoaffective disorder, bipolar type (HCC) Diagnosis:  Principal Problem:   Schizoaffective disorder, bipolar type (HCC)   Total Time spent with patient: 30 minutes  Subjective:   Kenneth Jensen is a 30 y.o. male patient admitted with "I need help".  HPI: Patient seen chart reviewed.  30 year old man with schizophrenia or schizoaffective disorder presents to the emergency room saying he is having suicidal ideation hallucinations feeling anxious.  Very vague about whether he has been on his medicine recently.  Admits to cannabis use but denies other drugs.  Denies homicidal ideation.  Earlier in his presentation had been irritable and uncooperative now withdrawn and blunted.  Past Psychiatric History: History of schizophrenia positive prior admissions.  Risk to Self:   Risk to Others:   Prior Inpatient Therapy:   Prior Outpatient Therapy:    Past Medical History:  Past Medical History:  Diagnosis Date  . Asthma   . Bipolar 1 disorder (HCC)   . Depression   . Schizophrenia (HCC)   . Schizotaxia    History reviewed. No pertinent surgical history. Family History: History reviewed. No pertinent family history. Family Psychiatric  History: Past history as noted Social History:  Social History   Substance and Sexual Activity  Alcohol Use Yes   Comment: social     Social History   Substance and Sexual Activity  Drug Use Yes  . Types: Marijuana    Social History   Socioeconomic History  . Marital status: Single    Spouse name: Not on file  . Number of children: Not on file  . Years of education: Not on file  . Highest education level: Not on file  Occupational History  .  Not on file  Tobacco Use  . Smoking status: Current Every Day Smoker  . Smokeless tobacco: Never Used  Substance and Sexual Activity  . Alcohol use: Yes    Comment: social  . Drug use: Yes    Types: Marijuana  . Sexual activity: Not on file  Other Topics Concern  . Not on file  Social History Narrative   ** Merged History Encounter **       Social Determinants of Health   Financial Resource Strain: Not on file  Food Insecurity: Not on file  Transportation Needs: Not on file  Physical Activity: Not on file  Stress: Not on file  Social Connections: Not on file   Additional Social History:    Allergies:   Allergies  Allergen Reactions  . Haldol [Haloperidol]     Pt states "I went crazy"  . Percocet [Oxycodone-Acetaminophen]   . Vicodin [Hydrocodone-Acetaminophen] Itching  . Vicodin [Hydrocodone-Acetaminophen]     Labs:  Results for orders placed or performed during the hospital encounter of 05/12/20 (from the past 48 hour(s))  Comprehensive metabolic panel     Status: Abnormal   Collection Time: 05/12/20  9:41 PM  Result Value Ref Range   Sodium 138 135 - 145 mmol/L   Potassium 3.2 (L) 3.5 - 5.1 mmol/L   Chloride 102 98 - 111 mmol/L   CO2 26 22 - 32 mmol/L   Glucose, Bld 126 (H) 70 - 99 mg/dL    Comment: Glucose reference range applies only to samples taken after fasting for at least 8 hours.   BUN  9 6 - 20 mg/dL   Creatinine, Ser 5.85 0.61 - 1.24 mg/dL   Calcium 8.7 (L) 8.9 - 10.3 mg/dL   Total Protein 7.4 6.5 - 8.1 g/dL   Albumin 4.1 3.5 - 5.0 g/dL   AST 19 15 - 41 U/L   ALT 11 0 - 44 U/L   Alkaline Phosphatase 43 38 - 126 U/L   Total Bilirubin 0.7 0.3 - 1.2 mg/dL   GFR, Estimated >27 >78 mL/min    Comment: (NOTE) Calculated using the CKD-EPI Creatinine Equation (2021)    Anion gap 10 5 - 15    Comment: Performed at [facility], [address] [place], [place] [address]  Ethanol     Status: None   Collection Time: 05/12/20  9:41 PM  Result  Value Ref Range   Alcohol, Ethyl (B) <10 <10 mg/dL    Comment: (NOTE) Lowest detectable limit for serum alcohol is 10 mg/dL.  For medical purposes only. Performed at [facility], [address] [place], [place] [address]   Salicylate level     Status: Abnormal   Collection Time: 05/12/20  9:41 PM  Result Value Ref Range   Salicylate Lvl <7.0 (L) 7.0 - 30.0 mg/dL    Comment: Performed at [facility], [address] [place], [place] [address]  Acetaminophen level     Status: Abnormal   Collection Time: 05/12/20  9:41 PM  Result Value Ref Range   Acetaminophen (Tylenol), Serum <10 (L) 10 - 30 ug/mL    Comment: (NOTE) Therapeutic concentrations vary significantly. A range of 10-30 ug/mL  may be an effective concentration for many patients. However, some  are best treated at concentrations outside of this range. Acetaminophen concentrations >150 ug/mL at 4 hours after ingestion  and >50 ug/mL at 12 hours after ingestion are often associated with  toxic reactions.  Performed at [facility], [address] [place], [place] [address]   cbc     Status: Abnormal   Collection Time: 05/12/20  9:41 PM  Result Value Ref Range   WBC 14.7 (H) 4.0 - 10.5 K/uL   RBC 4.17 (L) 4.22 - 5.81 MIL/uL   Hemoglobin 12.8 (L) 13.0 - 17.0 g/dL   HCT 19.5 (L) 09.3 - 26.7 %   MCV 90.2 80.0 - 100.0 fL   MCH 30.7 26.0 - 34.0 pg   MCHC 34.0 30.0 - 36.0 g/dL   RDW 12.4 58.0 - 99.8 %   Platelets 270 150 - 400 K/uL   nRBC 0.0 0.0 - 0.2 %    Comment: Performed at [facility], [address] [place], [place] [address]  Valproic acid level     Status: None   Collection Time: 05/12/20  9:41 PM  Result Value Ref Range   Valproic Acid Lvl 84 50.0 - 100.0 ug/mL    Comment: Performed at [facility], [address] [place], [place] [address]  Urine Drug Screen, Qualitative     Status: Abnormal   Collection Time: 05/12/20  9:58 PM  Result Value Ref Range    Tricyclic, Ur Screen NONE DETECTED NONE DETECTED   Amphetamines, Ur Screen NONE DETECTED NONE DETECTED   MDMA (Ecstasy)Ur Screen NONE DETECTED NONE DETECTED   Cocaine Metabolite,Ur [place] NONE DETECTED NONE DETECTED   Opiate, Ur Screen NONE DETECTED NONE DETECTED   Phencyclidine (PCP) Ur S NONE DETECTED NONE DETECTED   Cannabinoid 50 Ng, Ur [place] POSITIVE (A) NONE DETECTED   Barbiturates, Ur  Screen NONE DETECTED NONE DETECTED   Benzodiazepine, Ur Scrn NONE DETECTED NONE DETECTED   Methadone Scn, Ur NONE DETECTED NONE DETECTED    Comment: (NOTE) Tricyclics + metabolites, urine    Cutoff 1000 ng/mL Amphetamines + metabolites, urine  Cutoff 1000 ng/mL MDMA (Ecstasy), urine              Cutoff 500 ng/mL Cocaine Metabolite, urine          Cutoff 300 ng/mL Opiate + metabolites, urine        Cutoff 300 ng/mL Phencyclidine (PCP), urine         Cutoff 25 ng/mL Cannabinoid, urine                 Cutoff 50 ng/mL Barbiturates + metabolites, urine  Cutoff 200 ng/mL Benzodiazepine, urine              Cutoff 200 ng/mL Methadone, urine                   Cutoff 300 ng/mL  The urine drug screen provides only a preliminary, unconfirmed analytical test result and should not be used for non-medical purposes. Clinical consideration and professional judgment should be applied to any positive drug screen result due to possible interfering substances. A more specific alternate chemical method must be used in order to obtain a confirmed analytical result. Gas chromatography / mass spectrometry (GC/MS) is the preferred confirm atory method. Performed at [facility], [address] [place], [place] [address]   Resp Panel by RT-PCR (Flu A&B, Covid) Nasopharyngeal Swab     Status: None   Collection Time: 05/12/20 10:15 PM   Specimen: Nasopharyngeal Swab; Nasopharyngeal(NP) swabs in vial transport medium  Result Value Ref Range   SARS Coronavirus 2 by RT PCR NEGATIVE NEGATIVE    Comment:  (NOTE) SARS-CoV-2 target nucleic acids are NOT DETECTED.  The SARS-CoV-2 RNA is generally detectable in upper respiratory specimens during the acute phase of infection. The lowest concentration of SARS-CoV-2 viral copies this assay can detect is 138 copies/mL. A negative result does not preclude SARS-Cov-2 infection and should not be used as the sole basis for treatment or other patient management decisions. A negative result may occur with  improper specimen collection/handling, submission of specimen other than nasopharyngeal swab, presence of viral mutation(s) within the areas targeted by this assay, and inadequate number of viral copies(<138 copies/mL). A negative result must be combined with clinical observations, patient history, and epidemiological information. The expected result is Negative.  Fact Sheet for Patients:  BloggerCourse.comhttps://www.fda.gov/media/152166/download  Fact Sheet for Healthcare Providers:  SeriousBroker.ithttps://www.fda.gov/media/152162/download  This test is no t yet approved or cleared by the [place] FDA and  has been authorized for detection and/or diagnosis of SARS-CoV-2 by FDA under an Emergency Use Authorization (EUA). This EUA will remain  in effect (meaning this test can be used) for the duration of the COVID-19 declaration under Section 564(b)(1) of the Act, 21 U.S.C.section 360bbb-3(b)(1), unless the authorization is terminated  or revoked sooner.       Influenza A by PCR NEGATIVE NEGATIVE   Influenza B by PCR NEGATIVE NEGATIVE    Comment: (NOTE) The Xpert Xpress SARS-CoV-2/FLU/RSV plus assay is intended as an aid in the diagnosis of influenza from Nasopharyngeal swab specimens and should not be used as a sole basis for treatment. Nasal washings and aspirates are unacceptable for Xpert Xpress SARS-CoV-2/FLU/RSV testing.  Fact Sheet for Patients: BloggerCourse.comhttps://www.fda.gov/media/152166/download  Fact Sheet for Healthcare  Providers: SeriousBroker.ithttps://www.fda.gov/media/152162/download  This test is not yet approved or cleared by the [place] and has been authorized for detection and/or diagnosis of SARS-CoV-2 by FDA under an Emergency Use Authorization (EUA). This EUA will remain in effect (meaning this test can be used) for the duration of the COVID-19 declaration under Section 564(b)(1) of the Act, 21 U.S.C. section 360bbb-3(b)(1), unless the authorization is terminated or revoked.  Performed at [facility], [address] [place], [place] [address]     Current Facility-Administered Medications  Medication Dose Route Frequency Provider Last Rate Last Admin  . divalproex (DEPAKOTE ER) 24 hr tablet 500 mg  500 mg Oral TID Shaune Pollack, MD      . nicotine (NICODERM CQ - dosed in mg/24 hours) patch 21 mg  21 mg Transdermal Daily Shaune Pollack, MD   21 mg at 05/13/20 1950  . QUEtiapine (SEROQUEL) tablet 200 mg  200 mg Oral QHS Shaune Pollack, MD   200 mg at 05/13/20 2104  . traZODone (DESYREL) tablet 50 mg  50 mg Oral QHS PRN Shaune Pollack, MD   50 mg at 05/13/20 2104   Current Outpatient Medications  Medication Sig Dispense Refill  . divalproex (DEPAKOTE ER) 500 MG 24 hr tablet Take 1 tablet (500 mg total) by mouth 3 (three) times daily. 90 tablet 0  . paliperidone (INVEGA SUSTENNA) 234 MG/1.5ML SUSY injection Inject 234 mg into the muscle every 30 (thirty) days.    Marland Kitchen QUEtiapine (SEROQUEL) 200 MG tablet Take 1 tablet (200 mg total) by mouth at bedtime. 30 tablet 0  . traZODone (DESYREL) 50 MG tablet Take 1 tablet (50 mg total) by mouth at bedtime as needed for sleep. 30 tablet 0    Musculoskeletal: Strength & Muscle Tone: within normal limits Gait & Station: normal Patient leans: N/A            Psychiatric Specialty Exam:  Presentation  General Appearance: Bizarre  Eye Contact:Fleeting  Speech:Normal Rate  Speech Volume:Increased  Handedness:Right   Mood and  Affect  Mood:Anxious; Irritable; Labile  Affect:Inappropriate   Thought Process  Thought Processes:Disorganized; Irrevelant  Descriptions of Associations:Loose  Orientation:Full (Time, Place and Person)  Thought Content:Illogical  History of Schizophrenia/Schizoaffective disorder:Yes  Duration of Psychotic Symptoms:Greater than six months  Hallucinations:Hallucinations: Auditory; Visual  Ideas of Reference:None  Suicidal Thoughts:Suicidal Thoughts: No  Homicidal Thoughts:Homicidal Thoughts: Yes, Passive HI Passive Intent and/or Plan: Without Intent   Sensorium  Memory:Immediate Poor  Judgment:Poor  Insight:Lacking   Executive Functions  Concentration:Poor  Attention Span:Poor  Recall:Poor  Fund of Knowledge:Poor  Language:Poor   Psychomotor Activity  Psychomotor Activity:Psychomotor Activity: Normal   Assets  Assets:Desire for Improvement   Sleep  Sleep:Sleep: Poor   Physical Exam: Physical Exam Vitals and nursing note reviewed.  Constitutional:      Appearance: Normal appearance.  HENT:     Head: Normocephalic and atraumatic.     Mouth/Throat:     Pharynx: Oropharynx is clear.  Eyes:     Pupils: Pupils are equal, round, and reactive to light.  Cardiovascular:     Rate and Rhythm: Normal rate and regular rhythm.  Pulmonary:     Effort: Pulmonary effort is normal.     Breath sounds: Normal breath sounds.  Abdominal:     General: Abdomen is flat.     Palpations: Abdomen is soft.  Musculoskeletal:        General: Normal range of motion.  Skin:    General: Skin is warm and dry.  Neurological:  General: No focal deficit present.     Mental Status: He is alert. Mental status is at baseline.  Psychiatric:        Attention and Perception: He is inattentive.        Mood and Affect: Mood is depressed. Affect is blunt.        Speech: Speech is delayed.        Behavior: Behavior is slowed.        Thought Content: Thought content is  paranoid. Thought content includes suicidal ideation. Thought content does not include suicidal plan.        Cognition and Memory: Cognition is impaired.        Judgment: Judgment is impulsive.    Review of Systems  Constitutional: Negative.   HENT: Negative.   Eyes: Negative.   Respiratory: Negative.   Cardiovascular: Negative.   Gastrointestinal: Negative.   Musculoskeletal: Negative.   Skin: Negative.   Neurological: Negative.   Psychiatric/Behavioral: Positive for depression, hallucinations, substance abuse and suicidal ideas. The patient is nervous/anxious and has insomnia.    Blood pressure 105/69, pulse 66, temperature 97.9 F (36.6 C), temperature source Oral, resp. rate 17, height  (1.651 m), weight 97.1 kg, SpO2 100 %. Body mass index is 35.61 kg/m.  Treatment Plan Summary: Plan Patient is currently calm and medically stable.  Vitals unremarkable.  Recommendation is for admission to the psychiatric ward for treatment of schizoaffective disorder depressive qualities now with suicidal ideation.  Labs reviewed.  Orders placed.  Case reviewed with emergency room staff and behavioral health admissions team.  Orders placed for admission.  Disposition: Recommend psychiatric Inpatient admission when medically cleared.  Mordecai Rasmussen, MD 05/14/2020 12:56 PM

## 2020-05-14 NOTE — BH Assessment (Signed)
Patient is to be admitted to [facility] by Dr. Neale Burly on today, 05/14/20 at 2:00pm.  Attending Physician will be Dr. Toni Amend.   Patient has been assigned to room 305, by [facility] Charge Nurse [place].     ER staff is aware of the admission:  Inetta Fermo, ER Secretary    Dr. Cyril Loosen, ER MD   Annette Stable, Patient's Nurse   Anthony,Patient Access.

## 2020-05-15 LAB — LIPID PANEL
Cholesterol: 118 mg/dL (ref 0–200)
HDL: 31 mg/dL — ABNORMAL LOW (ref >40)
LDL Cholesterol: 67 mg/dL (ref 0–99)
Total CHOL/HDL Ratio: 3.8 RATIO
Triglycerides: 98 mg/dL (ref <150)
VLDL: 20 mg/dL (ref 0–40)

## 2020-05-15 LAB — HEMOGLOBIN A1C
Hgb A1c MFr Bld: 5.7 % — ABNORMAL HIGH (ref 4.8–5.6)
Mean Plasma Glucose: 116.89 mg/dL

## 2020-05-15 MED ORDER — NICOTINE 21 MG/24HR TD PT24
21.0000 mg | MEDICATED_PATCH | Freq: Every day | TRANSDERMAL | Status: DC
  Administered 2020-05-16 – 2020-05-17 (×2): 21 mg via TRANSDERMAL
  Filled 2020-05-15 (×2): qty 1

## 2020-05-15 NOTE — Progress Notes (Signed)
[facility] MD Progress Note  05/15/2020 10:56 AM Kenneth Jensen  MRN:  [id number]   CC "I'm feeling weird."  Subjective:  30 year old male with schizoaffective disorder presenting voluntarily to emergency room for suicidal ideations. No acute events overnight, medication compliant, and attending to ADLs.   This morning patient seen at bedside. He continues to report auditory hallucinations telling him that he is worthless and going to be a bum forever. This give him passive thoughts of wishing he were dead. He denies any active SI or HI. Denies visual hallucinations. He notes he was "feeling weird" this morning. When asked to clarify he reported feeing somewhat lightheaded. Vital signs within normal limits. Denies any other medication side effects. He remains irritable with some bizarre behavior. Whenever discharge planning is discussed he becomes very angry and threatens to sue the hospital. Still awaiting call back from ACT Team contacted yesterday afternoon.   Principal Problem: Schizoaffective disorder, bipolar type (HCC) Diagnosis: Principal Problem:   Schizoaffective disorder, bipolar type (HCC) Active Problems:   Cannabis use disorder, moderate, dependence (HCC)   Tobacco use disorder  Total Time spent with patient: 20 minutes  Past Psychiatric History: See H&P  Past Medical History:  Past Medical History:  Diagnosis Date  . Asthma   . Bipolar 1 disorder (HCC)   . Depression   . Schizophrenia (HCC)   . Schizotaxia    History reviewed. No pertinent surgical history. Family History: History reviewed. No pertinent family history. Family Psychiatric  History: See H&P Social History:  Social History   Substance and Sexual Activity  Alcohol Use Yes   Comment: social     Social History   Substance and Sexual Activity  Drug Use Yes  . Types: Marijuana    Social History   Socioeconomic History  . Marital status: Single    Spouse name: Not on file  . Number of children:  Not on file  . Years of education: Not on file  . Highest education level: Not on file  Occupational History  . Not on file  Tobacco Use  . Smoking status: Current Every Day Smoker  . Smokeless tobacco: Never Used  Substance and Sexual Activity  . Alcohol use: Yes    Comment: social  . Drug use: Yes    Types: Marijuana  . Sexual activity: Not on file  Other Topics Concern  . Not on file  Social History Narrative   ** Merged History Encounter **       Social Determinants of Health   Financial Resource Strain: Not on file  Food Insecurity: Not on file  Transportation Needs: Not on file  Physical Activity: Not on file  Stress: Not on file  Social Connections: Not on file   Additional Social History:                         Sleep: Good  Appetite:  Fair  Current Medications: Current Facility-Administered Medications  Medication Dose Route Frequency Provider Last Rate Last Admin  . acetaminophen (TYLENOL) tablet 650 mg  650 mg Oral Q6H PRN Clapacs, Jackquline Denmark, MD   650 mg at 05/15/20 4098  . alum & mag hydroxide-simeth (MAALOX/MYLANTA) 200-200-20 MG/5ML suspension 30 mL  30 mL Oral Q4H PRN Clapacs, John T, MD      . divalproex (DEPAKOTE ER) 24 hr tablet 500 mg  500 mg Oral TID Clapacs, Jackquline Denmark, MD   500 mg at 05/15/20 0949  . hydrOXYzine (ATARAX/VISTARIL)  tablet 50 mg  50 mg Oral TID PRN Clapacs, Jackquline Denmark, MD   50 mg at 05/15/20 0806  . magnesium hydroxide (MILK OF MAGNESIA) suspension 30 mL  30 mL Oral Daily PRN Clapacs, John T, MD      . nicotine (NICODERM CQ - dosed in mg/24 hours) patch 21 mg  21 mg Transdermal Daily Clapacs, Jackquline Denmark, MD   21 mg at 05/14/20 1811  . QUEtiapine (SEROQUEL) tablet 200 mg  200 mg Oral QHS Clapacs, John T, MD   200 mg at 05/14/20 2116  . traZODone (DESYREL) tablet 50 mg  50 mg Oral QHS PRN Clapacs, Jackquline Denmark, MD   50 mg at 05/14/20 2117    Lab Results: No results found for this or any previous visit (from the past 48 hour(s)).  Blood Alcohol  level:  Lab Results  Component Value Date   ETH <10 05/12/2020   ETH <10 11/04/2019    Metabolic Disorder Labs: No results found for: HGBA1C, MPG No results found for: PROLACTIN Lab Results  Component Value Date   CHOL 134 08/09/2018   TRIG 99 08/09/2018   HDL 39 (L) 08/09/2018   CHOLHDL 3.4 08/09/2018   VLDL 20 08/09/2018   LDLCALC 75 08/09/2018    Physical Findings: AIMS:  , ,  ,  ,    CIWA:    COWS:     Musculoskeletal: Strength & Muscle Tone: within normal limits Gait & Station: normal Patient leans: N/A  Psychiatric Specialty Exam:  Presentation  General Appearance: Fairly Groomed  Eye Contact:Minimal  Speech:Normal Rate  Speech Volume:Increased  Handedness:Right   Mood and Affect  Mood:Irritable  Affect:Congruent   Thought Process  Thought Processes:Disorganized  Descriptions of Associations:Loose  Orientation:Full (Time, Place and Person)  Thought Content:Paranoid Ideation; Delusions; Illogical  History of Schizophrenia/Schizoaffective disorder:Yes  Duration of Psychotic Symptoms:Greater than six months  Hallucinations:Hallucinations: Auditory Description of Auditory Hallucinations: Voices telling him that he will do crack and live on the streets forever  Ideas of Reference:Paranoia; Percusatory  Suicidal Thoughts:Suicidal Thoughts: No  Homicidal Thoughts:Homicidal Thoughts: No HI Passive Intent and/or Plan: Without Intent   Sensorium  Memory:Immediate Fair; Recent Fair; Remote Fair  Judgment:Poor  Insight:Shallow   Executive Functions  Concentration:Poor  Attention Span:Poor  Recall:Fair  Fund of Knowledge:Fair  Language:Fair   Psychomotor Activity  Psychomotor Activity:Psychomotor Activity: Decreased   Assets  Assets:Communication Skills; Desire for Improvement; Financial Resources/Insurance; Physical Health; Resilience; Social Support   Sleep  Sleep:Sleep: Good    Physical Exam: Physical  Exam ROS Blood pressure 131/88, pulse 76, temperature (!) 97.3 F (36.3 C), temperature source Oral, resp. rate 18, height 5\' 5"  (1.651 m), weight 94.8 kg, SpO2 100 %. Body mass index is 34.78 kg/m.   Treatment Plan Summary: Daily contact with patient to assess and evaluate symptoms and progress in treatment and Medication management 30 year old male with schizoaffective disorder, bipolar type presenting with suicidal ideations and auditory hallucinations that are derogatory in nature. Resume Deapktoe 500 mg TID (level 84 on 05/12/20), seroquel 200 mg QHS, and trazodone 50 mg. Will need to verify last Invega Sustenna 234 mg IM injection with ACT Team. Per patient, he received last week. Nicoderm 21 mg patch for tobacco use disorder. Cannabis cessation counseling provided today. Patient refused labs this morning, will continue to try and draw lipid panel and hemoglobin A1c for metabolic side effect profile.   07/12/20, MD 05/15/2020, 10:56 AM

## 2020-05-15 NOTE — Progress Notes (Signed)
Recreation Therapy Notes    Date: 05/15/2020  Time: 9:45am   Location: Craft room  Behavioral response: N/A   Intervention Topic: Coping Skills    Discussion/Intervention: Patient did not attend group.   Clinical Observations/Feedback:  Patient did not attend group.   Elvyn Krohn LRT/CTRS        Erandy Mceachern 05/15/2020 12:10 PM

## 2020-05-15 NOTE — BHH Group Notes (Signed)
LCSW Group Therapy Note     05/15/2020 1:47 PM     Type of Therapy/Topic:  Group Therapy:  Feelings about Diagnosis     Participation Level:  Did Not Attend     Description of Group:   This group will allow patients to explore their thoughts and feelings about diagnoses they have received. Patients will be guided to explore their level of understanding and acceptance of these diagnoses. Facilitator will encourage patients to process their thoughts and feelings about the reactions of others to their diagnosis and will guide patients in identifying ways to discuss their diagnosis with significant others in their lives. This group will be process-oriented, with patients participating in exploration of their own experiences, giving and receiving support, and processing challenge from other group members.        Therapeutic Goals:  1.    Patient will demonstrate understanding of diagnosis as evidenced by identifying two or more symptoms of the disorder  2.    Patient will be able to express two feelings regarding the diagnosis  3.    Patient will demonstrate their ability to communicate their needs through discussion and/or role play     Summary of Patient Progress: X  Therapeutic Modalities:   Cognitive Behavioral Therapy  Brief Therapy  Feelings Identification    Elijio Staples [place], MSW, LCSW-A  05/15/2020 1:47 PM

## 2020-05-15 NOTE — Progress Notes (Signed)
Patient was cooperative on shift .He denies SI and HI. He was medication compliant, visible in the milieu, behavior is still somewhat bizarre. He is currently in bed resting quietly at this time.

## 2020-05-15 NOTE — Progress Notes (Signed)
D: Pt. Alert and oriented. Pt rates depression 8/10 and anxiety 8/10. Pt. Reports sleep last night as being good. Pt complained of headache early morning. Reassessed pain 0/10. Pt denies experiencing  SI/HI, but is hearing auditory voices telling him he is no good.  1532 Pt received prn Tylenol for tooth pain 10/10 on left side and tooth is cracked. Pt sitting in dayroom.  1601 Pt came to me to let me know his tooth pain is 1/10.  A: Scheduled medications, prn administered to pt, per MD orders. Support and encouragement provided. Frequent verbal contact made. Routine safety checks conducted q15 minutes.  R: No adverse drug reaction noted. Pt remains safe at this time. Will continue to monitor.

## 2020-05-15 NOTE — BHH Counselor (Signed)
CSW attempted to complete Comprehensive Assessment with pt. Pt was irritable and sleepy and stopped answering questions, CSW was unable to complete assessment.  Kenneth Jensen, MSW, LCSW-A 5/10/202210:40 AM

## 2020-05-16 DIAGNOSIS — R7303 Prediabetes: Secondary | ICD-10-CM | POA: Diagnosis present

## 2020-05-16 NOTE — Plan of Care (Signed)
Patient stated that he feels better with his anxiety and depression. Patient pleasant and cooperative on approach. Appropriate with staff & peers.Denies SI,HI and AVH.Compliant with medications. Attended groups at the courtyard. Appetite and energy level good. Support and encouragement given.

## 2020-05-16 NOTE — BHH Suicide Risk Assessment (Addendum)
BHH INPATIENT:  Family/Significant Other Suicide Prevention Education  Suicide Prevention Education:  Contact Attempts: Ladoris Gene, aunt (name of family member/significant other) has been identified by the patient as the family member/significant other with whom the patient will be residing, and identified as the person(s) who will aid the patient in the event of a mental health crisis.  With written consent from the patient, two attempts were made to provide suicide prevention education, prior to and/or following the patient's discharge.  We were unsuccessful in providing suicide prevention education.  A suicide education pamphlet was given to the patient to share with family/significant other.  Date and time of first attempt:05/16/20/1:33pm Date and time of second attempt:  Pearse Shiffler A [place] 05/16/2020, 1:32 PM

## 2020-05-16 NOTE — Progress Notes (Signed)
Sleep: 7.45

## 2020-05-16 NOTE — Progress Notes (Signed)
[facility] MD Progress Note  05/16/2020 2:10 PM Kenneth Jensen  MRN:  [id number]   CC "I've got a headache."  Subjective:  30 year old male with schizoaffective disorder presenting voluntarily to emergency room for suicidal ideations. No acute events overnight, medication compliant, and attending to ADLs.   This morning patient seen during treatment team and again one-on-one. His goals remain to get a job, and get a new place to live. He also notes a desire to improve his coping skills. He denies suicidal ideations or homicidal ideations. He continues to endorse auditory hallucinations of voices telling him he will be homeless forever. He also notes he has a headache today. CSW reaching out to ACT Team today to see if they have any leads on housing.   Principal Problem: Schizoaffective disorder, bipolar type (HCC) Diagnosis: Principal Problem:   Schizoaffective disorder, bipolar type (HCC) Active Problems:   Cannabis use disorder, moderate, dependence (HCC)   Tobacco use disorder  Total Time spent with patient: 20 minutes  Past Psychiatric History: See H&P  Past Medical History:  Past Medical History:  Diagnosis Date  . Asthma   . Bipolar 1 disorder (HCC)   . Depression   . Schizophrenia (HCC)   . Schizotaxia    History reviewed. No pertinent surgical history. Family History: History reviewed. No pertinent family history. Family Psychiatric  History: See H&P Social History:  Social History   Substance and Sexual Activity  Alcohol Use Yes   Comment: social     Social History   Substance and Sexual Activity  Drug Use Yes  . Types: Marijuana    Social History   Socioeconomic History  . Marital status: Single    Spouse name: Not on file  . Number of children: Not on file  . Years of education: Not on file  . Highest education level: Not on file  Occupational History  . Not on file  Tobacco Use  . Smoking status: Current Every Day Smoker  . Smokeless tobacco: Never  Used  Substance and Sexual Activity  . Alcohol use: Yes    Comment: social  . Drug use: Yes    Types: Marijuana  . Sexual activity: Not on file  Other Topics Concern  . Not on file  Social History Narrative   ** Merged History Encounter **       Social Determinants of Health   Financial Resource Strain: Not on file  Food Insecurity: Not on file  Transportation Needs: Not on file  Physical Activity: Not on file  Stress: Not on file  Social Connections: Not on file   Additional Social History:    Sleep: Good  Appetite:  Fair  Current Medications: Current Facility-Administered Medications  Medication Dose Route Frequency Provider Last Rate Last Admin  . acetaminophen (TYLENOL) tablet 650 mg  650 mg Oral Q6H PRN Clapacs, Jackquline Denmark, MD   650 mg at 05/16/20 1323  . alum & mag hydroxide-simeth (MAALOX/MYLANTA) 200-200-20 MG/5ML suspension 30 mL  30 mL Oral Q4H PRN Clapacs, John T, MD      . divalproex (DEPAKOTE ER) 24 hr tablet 500 mg  500 mg Oral TID Clapacs, John T, MD   500 mg at 05/16/20 1322  . hydrOXYzine (ATARAX/VISTARIL) tablet 50 mg  50 mg Oral TID PRN Clapacs, Jackquline Denmark, MD   50 mg at 05/15/20 2012  . magnesium hydroxide (MILK OF MAGNESIA) suspension 30 mL  30 mL Oral Daily PRN Clapacs, Jackquline Denmark, MD      .  nicotine (NICODERM CQ - dosed in mg/24 hours) patch 21 mg  21 mg Transdermal Daily Jesse Sans, MD   21 mg at 05/16/20 0846  . QUEtiapine (SEROQUEL) tablet 200 mg  200 mg Oral QHS Clapacs, John T, MD   200 mg at 05/15/20 2113  . traZODone (DESYREL) tablet 50 mg  50 mg Oral QHS PRN Clapacs, Jackquline Denmark, MD   50 mg at 05/15/20 2113    Lab Results:  Results for orders placed or performed during the hospital encounter of 05/14/20 (from the past 48 hour(s))  Lipid panel     Status: Abnormal   Collection Time: 05/15/20 10:51 AM  Result Value Ref Range   Cholesterol 118 0 - 200 mg/dL   Triglycerides 98 <878 mg/dL   HDL 31 (L) >67 mg/dL   Total CHOL/HDL Ratio 3.8 RATIO   VLDL  20 0 - 40 mg/dL   LDL Cholesterol 67 0 - 99 mg/dL    Comment:        Total Cholesterol/HDL:CHD Risk Coronary Heart Disease Risk Table                     Men   Women  1/2 Average Risk   3.4   3.3  Average Risk       5.0   4.4  2 X Average Risk   9.6   7.1  3 X Average Risk  23.4   11.0        Use the calculated Patient Ratio above and the CHD Risk Table to determine the patient's CHD Risk.        ATP III CLASSIFICATION (LDL):  <100     mg/dL   Optimal  672-094  mg/dL   Near or Above                    Optimal  130-159  mg/dL   Borderline  709-628  mg/dL   High  >366     mg/dL   Very High Performed at [facility], [address] [place], [place] [address]   Hemoglobin A1c     Status: Abnormal   Collection Time: 05/15/20 10:51 AM  Result Value Ref Range   Hgb A1c MFr Bld 5.7 (H) 4.8 - 5.6 %    Comment: (NOTE) Pre diabetes:          5.7%-6.4%  Diabetes:              >6.4%  Glycemic control for   <7.0% adults with diabetes    Mean Plasma Glucose 116.89 mg/dL    Comment: Performed at [facility], [address] [place], [place] [address]    Blood Alcohol level:  Lab Results  Component Value Date   [facility] <10 05/12/2020   ETH <10 11/04/2019    Metabolic Disorder Labs: Lab Results  Component Value Date   HGBA1C 5.7 (H) 05/15/2020   MPG 116.89 05/15/2020   No results found for: PROLACTIN Lab Results  Component Value Date   CHOL 118 05/15/2020   TRIG 98 05/15/2020   HDL 31 (L) 05/15/2020   CHOLHDL 3.8 05/15/2020   VLDL 20 05/15/2020   LDLCALC 67 05/15/2020   LDLCALC 75 08/09/2018    Physical Findings: AIMS:  , ,  ,  ,    CIWA:    COWS:     Musculoskeletal: Strength & Muscle Tone: within normal limits Gait & Station: normal Patient leans: N/A  Psychiatric Specialty Exam:  Presentation  General Appearance: Fairly Groomed  Eye Contact:Good  Speech:Normal Rate  Speech Volume:Normal  Handedness:Right   Mood and Affect   Mood:Euthymic  Affect:Congruent   Thought Process  Thought Processes:Goal Directed  Descriptions of Associations:Intact  Orientation:Full (Time, Place and Person)  Thought Content:Paranoid Ideation  History of Schizophrenia/Schizoaffective disorder:Yes  Duration of Psychotic Symptoms:Greater than six months  Hallucinations:Hallucinations: Auditory Description of Auditory Hallucinations: Voices telling him that he will do crack and live on the streets forever  Ideas of Reference:Paranoia  Suicidal Thoughts:Suicidal Thoughts: No  Homicidal Thoughts:Homicidal Thoughts: No   Sensorium  Memory:Immediate Good; Recent Good; Remote Good  Judgment:Poor  Insight:Shallow   Executive Functions  Concentration:Fair  Attention Span:Fair  Recall:Fair  Fund of Knowledge:Fair  Language:Fair   Psychomotor Activity  Psychomotor Activity:Psychomotor Activity: Normal   Assets  Assets:Communication Skills; Desire for Improvement; Financial Resources/Insurance; Resilience; Social Support   Sleep  Sleep:Sleep: Fair Number of Hours of Sleep: 7.45    Physical Exam: Physical Exam  ROS  Blood pressure 126/79, pulse 67, temperature 97.6 F (36.4 C), temperature source Oral, resp. rate 17, height 5\' 5"  (1.651 m), weight 94.8 kg, SpO2 100 %. Body mass index is 34.78 kg/m.   Treatment Plan Summary: Daily contact with patient to assess and evaluate symptoms and progress in treatment and Medication management 30 year old male with schizoaffective disorder, bipolar type presenting with suicidal ideations and auditory hallucinations that are derogatory in nature. Continue Depakote 500 mg TID (level 84 on 05/12/20), seroquel 200 mg QHS, and trazodone 50 mg. Will need to verify last Invega Sustenna 234 mg IM injection with ACT Team. Per patient, he received last week. Nicoderm 21 mg patch for tobacco use disorder. Cannabis cessation counseling provided today. Lipid panel grossly  normal with exception of low HDL 31. Hemoglobin a1c 5.7, prediabetic. Diet counseling provided.   07/12/20, MD 05/16/2020, 2:10 PM

## 2020-05-16 NOTE — BHH Group Notes (Signed)
LCSW Group Therapy Note  05/16/2020 2:43 PM  Type of Therapy/Topic:  Group Therapy:  Emotion Regulation  Participation Level:  Active   Description of Group:   The purpose of this group is to assist patients in learning to regulate negative emotions and experience positive emotions. Patients will be guided to discuss ways in which they have been vulnerable to their negative emotions. These vulnerabilities will be juxtaposed with experiences of positive emotions or situations, and patients will be challenged to use positive emotions to combat negative ones. Special emphasis will be placed on coping with negative emotions in conflict situations, and patients will process healthy conflict resolution skills.  Therapeutic Goals: 1. Patient will identify two positive emotions or experiences to reflect on in order to balance out negative emotions 2. Patient will label two or more emotions that they find the most difficult to experience 3. Patient will demonstrate positive conflict resolution skills through discussion and/or role plays  Summary of Patient Progress: Patient was present for the entirety of the group session. Patient was an active listener and participated in the topic of discussion, provided helpful advice to others, and added nuance to topic of conversation. Patient shared that his emotions are tied to locations. For example: patient shared that he has positive emotions associated with the city of [place], while having mixed emotions with the city of [facility].    Therapeutic Modalities:   Cognitive Behavioral Therapy Feelings Identification Dialectical Behavioral Therapy  Gwenevere Ghazi, MSW, Bryon Lions 05/16/2020 2:43 PM

## 2020-05-16 NOTE — Progress Notes (Signed)
Recreation Therapy Notes    Date: 05/16/2020  Time: 9:30 am   Location: Craft room  Behavioral response: N/A   Intervention Topic: Relaxation    Discussion/Intervention: Patient did not attend group.   Clinical Observations/Feedback:  Patient did not attend group.   Adalynd Donahoe LRT/CTRS         Heyward Douthit 05/16/2020 11:37 AM

## 2020-05-16 NOTE — Tx Team (Signed)
Interdisciplinary Treatment and Diagnostic Plan Update  05/16/2020 Time of Session: 9:00AM Kenneth Jensen MRN: [id number]  Principal Diagnosis: Schizoaffective disorder, bipolar type [facility])  Secondary Diagnoses: Principal Problem:   Schizoaffective disorder, bipolar type ([place]) Active Problems:   Cannabis use disorder, moderate, dependence ([place])   Tobacco use disorder   Current Medications:  Current Facility-Administered Medications  Medication Dose Route Frequency Provider Last Rate Last Admin  . acetaminophen (TYLENOL) tablet 650 mg  650 mg Oral Q6H PRN Clapacs, Madie Reno, MD   650 mg at 05/16/20 0203  . alum & mag hydroxide-simeth (MAALOX/MYLANTA) 200-200-20 MG/5ML suspension 30 mL  30 mL Oral Q4H PRN Clapacs, John T, MD      . divalproex (DEPAKOTE ER) 24 hr tablet 500 mg  500 mg Oral TID Clapacs, John T, MD   500 mg at 05/15/20 1719  . hydrOXYzine (ATARAX/VISTARIL) tablet 50 mg  50 mg Oral TID PRN Clapacs, Madie Reno, MD   50 mg at 05/15/20 2012  . magnesium hydroxide (MILK OF MAGNESIA) suspension 30 mL  30 mL Oral Daily PRN Clapacs, John T, MD      . nicotine (NICODERM CQ - dosed in mg/24 hours) patch 21 mg  21 mg Transdermal Daily Selina Cooley M, MD      . QUEtiapine (SEROQUEL) tablet 200 mg  200 mg Oral QHS Clapacs, Madie Reno, MD   200 mg at 05/15/20 2113  . traZODone (DESYREL) tablet 50 mg  50 mg Oral QHS PRN Clapacs, Madie Reno, MD   50 mg at 05/15/20 2113   PTA Medications: Medications Prior to Admission  Medication Sig Dispense Refill Last Dose  . divalproex (DEPAKOTE ER) 500 MG 24 hr tablet Take 1 tablet (500 mg total) by mouth 3 (three) times daily. 90 tablet 0   . paliperidone (INVEGA SUSTENNA) 234 MG/1.5ML SUSY injection Inject 234 mg into the muscle every 30 (thirty) days.     Marland Kitchen QUEtiapine (SEROQUEL) 200 MG tablet Take 1 tablet (200 mg total) by mouth at bedtime. 30 tablet 0   . traZODone (DESYREL) 50 MG tablet Take 1 tablet (50 mg total) by mouth at bedtime as needed for  sleep. 30 tablet 0     Patient Stressors: Financial difficulties Occupational concerns  Patient Strengths: Physical Health  Treatment Modalities: Medication Management, Group therapy, Case management,  1 to 1 session with clinician, Psychoeducation, Recreational therapy.   Physician Treatment Plan for Primary Diagnosis: Schizoaffective disorder, bipolar type ([place]) Long Term Goal(s): Improvement in symptoms so as ready for discharge Improvement in symptoms so as ready for discharge   Short Term Goals: Ability to identify changes in lifestyle to reduce recurrence of condition will improve Ability to verbalize feelings will improve Ability to disclose and discuss suicidal ideas Ability to demonstrate self-control will improve Ability to identify and develop effective coping behaviors will improve Ability to maintain clinical measurements within normal limits will improve Compliance with prescribed medications will improve Ability to identify triggers associated with substance abuse/mental health issues will improve Ability to identify changes in lifestyle to reduce recurrence of condition will improve Ability to verbalize feelings will improve Ability to disclose and discuss suicidal ideas Ability to demonstrate self-control will improve Ability to identify and develop effective coping behaviors will improve Ability to maintain clinical measurements within normal limits will improve Compliance with prescribed medications will improve Ability to identify triggers associated with substance abuse/mental health issues will improve  Medication Management: Evaluate patient's response, side effects, and tolerance of medication regimen.  Therapeutic Interventions: 1 to 1 sessions, Unit Group sessions and Medication administration.  Evaluation of Outcomes: Not Met  Physician Treatment Plan for Secondary Diagnosis: Principal Problem:   Schizoaffective disorder, bipolar type ([place]) Active  Problems:   Cannabis use disorder, moderate, dependence ([place])   Tobacco use disorder  Long Term Goal(s): Improvement in symptoms so as ready for discharge Improvement in symptoms so as ready for discharge   Short Term Goals: Ability to identify changes in lifestyle to reduce recurrence of condition will improve Ability to verbalize feelings will improve Ability to disclose and discuss suicidal ideas Ability to demonstrate self-control will improve Ability to identify and develop effective coping behaviors will improve Ability to maintain clinical measurements within normal limits will improve Compliance with prescribed medications will improve Ability to identify triggers associated with substance abuse/mental health issues will improve Ability to identify changes in lifestyle to reduce recurrence of condition will improve Ability to verbalize feelings will improve Ability to disclose and discuss suicidal ideas Ability to demonstrate self-control will improve Ability to identify and develop effective coping behaviors will improve Ability to maintain clinical measurements within normal limits will improve Compliance with prescribed medications will improve Ability to identify triggers associated with substance abuse/mental health issues will improve     Medication Management: Evaluate patient's response, side effects, and tolerance of medication regimen.  Therapeutic Interventions: 1 to 1 sessions, Unit Group sessions and Medication administration.  Evaluation of Outcomes: Not Met   RN Treatment Plan for Primary Diagnosis: Schizoaffective disorder, bipolar type ([place]) Long Term Goal(s): Knowledge of disease and therapeutic regimen to maintain health will improve  Short Term Goals: Ability to remain free from injury will improve, Ability to verbalize frustration and anger appropriately will improve, Ability to demonstrate self-control, Ability to participate in decision making will  improve, Ability to verbalize feelings will improve, Ability to disclose and discuss suicidal ideas, Ability to identify and develop effective coping behaviors will improve and Compliance with prescribed medications will improve  Medication Management: RN will administer medications as ordered by provider, will assess and evaluate patient's response and provide education to patient for prescribed medication. RN will report any adverse and/or side effects to prescribing provider.  Therapeutic Interventions: 1 on 1 counseling sessions, Psychoeducation, Medication administration, Evaluate responses to treatment, Monitor vital signs and CBGs as ordered, Perform/monitor CIWA, COWS, AIMS and Fall Risk screenings as ordered, Perform wound care treatments as ordered.  Evaluation of Outcomes: Not Met   LCSW Treatment Plan for Primary Diagnosis: Schizoaffective disorder, bipolar type ([place]) Long Term Goal(s): Safe transition to appropriate next level of care at discharge, Engage patient in therapeutic group addressing interpersonal concerns.  Short Term Goals: Engage patient in aftercare planning with referrals and resources, Increase social support, Increase ability to appropriately verbalize feelings, Increase emotional regulation, Facilitate acceptance of mental health diagnosis and concerns, Identify triggers associated with mental health/substance abuse issues and Increase skills for wellness and recovery  Therapeutic Interventions: Assess for all discharge needs, 1 to 1 time with Social worker, Explore available resources and support systems, Assess for adequacy in community support network, Educate family and significant other(s) on suicide prevention, Complete Psychosocial Assessment, Interpersonal group therapy.  Evaluation of Outcomes: Not Met   Progress in Treatment: Attending groups: No. Participating in groups: No. Taking medication as prescribed: Yes. Toleration medication:  Yes. Family/Significant other contact made: No, will contact:  when given permssion.  Patient understands diagnosis: Yes. Discussing patient identified problems/goals with staff: Yes. Medical problems stabilized or resolved:  Yes. Denies suicidal/homicidal ideation: Yes. Issues/concerns per patient self-inventory: No. Other: None.  New problem(s) identified: No, Describe:  none.  New Short Term/Long Term Goal(s): elimination of symptoms of psychosis, medication management for mood stabilization; elimination of SI thoughts; development of comprehensive mental wellness/sobriety plan.  Patient Goals: "Get a new place to live... a job."   Discharge Plan or Barriers: CSW will assist pt with development of an appropriate aftercare/discharge plan.  Reason for Continuation of Hospitalization: Aggression Hallucinations Medication stabilization Suicidal ideation  Estimated Length of Stay: 1-7 days  Attendees: Patient: Kenneth Jensen 05/16/2020 9:16 AM  Physician: Selina Cooley, MD 05/16/2020 9:16 AM  Nursing: West Pugh, RN 05/16/2020 9:16 AM  RN Care Manager: 05/16/2020 9:16 AM  Social Worker: Chalmers Guest. Guerry Bruin, MSW, [place], [place] 05/16/2020 9:16 AM  Recreational Therapist: Devin Going, LRT  05/16/2020 9:16 AM  Other: Kiva [place], MSW, LCSW-A 05/16/2020 9:16 AM  Other: Paulla Dolly, MSW, [place], [place] 05/16/2020 9:16 AM  Other: 05/16/2020 9:16 AM    Scribe for Treatment Team: Shirl Harris, LCSW 05/16/2020 9:16 AM

## 2020-05-16 NOTE — BHH Counselor (Signed)
CSW spoke with pt following conversation with [facility], pt's ACTT lead. Pt stated that he cannot go back to his friend's house that he was staying at before he came to the hospital. He stated that he does have family in [place] that he would like to be around, but he cannot stay with them. He stated that he has a [employer] ID. CSW will follow up with pt tomorrow about potential temporary placement at a [facility] per pt's request, until his permanent housing is established in the local area.   Kenneth Jensen, MSW, LCSW-A 5/11/20224:12 PM

## 2020-05-16 NOTE — BHH Counselor (Signed)
CSW spoke with [place], [phone]. pt's actt lead. She stated pt could go back to friends house and came to the hospital after being upset due to not receiving his monthly disability check. She stated that pt is  "fine" and could go home as far as she is concerned. She stated that the team is working to find him housing and finalization of paperwork,etc.  would be complete  by the end of the month. \   Draughon [place], MSW, LCSW-A 5/11/20224:10 PM

## 2020-05-16 NOTE — Progress Notes (Signed)
Patient was very agitated after speaking with his family members. He was stuck on them using him for his money and thinking that he was "slow". He was given vistaril for agitation. 0200 the MHT notified me that the patient was complaining of hearing voices and having a headache. I administered 650mg  of tylenol and the patient returned to sleep. The patient denies SI/HI AVH. Will continue to monitor and provide support.

## 2020-05-16 NOTE — Progress Notes (Signed)
Recreation Therapy Notes  INPATIENT RECREATION THERAPY ASSESSMENT  Patient Details Name: Kenneth Jensen MRN: [id number] DOB: Jul 14, 1990 Today's Date: 05/16/2020       Information Obtained From: Patient  Able to Participate in Assessment/Interview: Yes  Patient Presentation: Responsive  Reason for Admission (Per Patient): Active Symptoms  Patient Stressors: Other (Comment) (Homeless)  Coping Skills:   Deep Breathing,Other (Comment) (Count)  Leisure Interests (2+):  Exercise - Walking  Frequency of Recreation/Participation: Weekly  Awareness of Community Resources:  Yes  Community Resources:  Park  Current Use: Yes  

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
## 2020-05-17 MED ORDER — BENZOCAINE 10 % MT GEL
Freq: Three times a day (TID) | OROMUCOSAL | Status: DC | PRN
  Administered 2020-05-17: 1 via OROMUCOSAL
  Filled 2020-05-17: qty 9

## 2020-05-17 MED ORDER — IBUPROFEN 600 MG PO TABS
800.0000 mg | ORAL_TABLET | Freq: Four times a day (QID) | ORAL | Status: DC | PRN
  Administered 2020-05-17: 800 mg via ORAL
  Filled 2020-05-17: qty 1

## 2020-05-17 NOTE — BHH Suicide Risk Assessment (Signed)
BHH INPATIENT:  Family/Significant Other Suicide Prevention Education  Suicide Prevention Education:  Education Completed; Merchandiser, retail  (name of family member/significant other) has been identified by the patient as the family member/significant other with whom the patient will be residing, and identified as the person(s) who will aid the patient in the event of a mental health crisis (suicidal ideations/suicide attempt).  With written consent from the patient, the family member/significant other has been provided the following suicide prevention education, prior to the and/or following the discharge of the patient.  The suicide prevention education provided includes the following:  Suicide risk factors  Suicide prevention and interventions  National Suicide Hotline telephone number  [facility] assessment telephone number  [facility] Emergency Assistance 911  [facility] and/or Residential Mobile Crisis Unit telephone number  Request made of family/significant other to:  Remove weapons (e.g., guns, rifles, knives), all items previously/currently identified as safety concern.    Remove drugs/medications (over-the-counter, prescriptions, illicit drugs), all items previously/currently identified as a safety concern.  The family member/significant other verbalizes understanding of the suicide prevention education information provided.  The family member/significant other agrees to remove the items of safety concern listed above.  Siddiq Kaluzny A [place] 05/17/2020, 2:14 PM

## 2020-05-17 NOTE — BHH Counselor (Signed)
CSW spoke with Chasity, the [facility] team member who works with pt's Act team [phone]). She stated that they arranged a hotel for pt to stay at, [facility]  [address] [place], [place] [address]. Patient can arrive at the hotel anytime after 2pm. She stated that patient can contact her if any issues arise. She also stated that Tresa Endo, a colleague could be contacted as well, [phone].   Valbona Slabach [place], MSW, LCSW-A 5/12/20223:35 PM

## 2020-05-17 NOTE — BHH Group Notes (Signed)
LCSW Group Therapy Note  05/17/2020 2:52 PM  Type of Therapy/Topic:  Group Therapy:  Balance in Life  Participation Level:  Did Not Attend  Description of Group:    This group will address the concept of balance and how it feels and looks when one is unbalanced. Patients will be encouraged to process areas in their lives that are out of balance and identify reasons for remaining unbalanced. Facilitators will guide patients in utilizing problem-solving interventions to address and correct the stressor making their life unbalanced. Understanding and applying boundaries will be explored and addressed for obtaining and maintaining a balanced life. Patients will be encouraged to explore ways to assertively make their unbalanced needs known to significant others in their lives, using other group members and facilitator for support and feedback.  Therapeutic Goals: 1. Patient will identify two or more emotions or situations they have that consume much of in their lives. 2. Patient will identify signs/triggers that life has become out of balance:  3. Patient will identify two ways to set boundaries in order to achieve balance in their lives:  4. Patient will demonstrate ability to communicate their needs through discussion and/or role plays  Summary of Patient Progress: X  Therapeutic Modalities:   Cognitive Behavioral Therapy Solution-Focused Therapy Assertiveness Training  Simona Huh R. Algis Greenhouse, MSW, LCSW, LCAS 05/17/2020 2:52 PM

## 2020-05-17 NOTE — Plan of Care (Signed)
Patient during assessments this morning endorsed a dysphoric mood and affect was constricted and flat. Pt. To begin our engagement endorsed several times repeatedly that his tooth is hurting him and that he needs more medicine for this. Pt. Educated that this Clinical research associate would notify the provider and get additional care orders for him. Pt. Denied si/hi, but continues to endorse paranoid ideations and auditory hallucinations. Pt. Orientation intact, though thought process is superficially linear and goal directed and overall concrete.   Patient has been complaint with medications and unit procedures thus far with direction and encouragement. Pt. Has been observed eating good thus far. Pt. Has been able to remain safe on the unit thus far. Pt. Thus far has been mostly observed resting in his room and or watching tv in the day room.   Q x 15 minute observation checks in place/maintained for safety. Patient is provided with education throughout shift when appropriate and able.  Patient is given/offered medications per orders. Patient is encouraged to attend groups, participate in unit activities and continue with plan of care. Pt. Chart and plans of care reviewed. Pt. Given support and encouragement when appropriate and able.     Problem: Education: Goal: Emotional status will improve Outcome: Not Progressing Note: Pt. Presents with concrete thought process, paranoid themes, and endorsed AH.  Pt. Is irritable and dysphoric this morning.   Goal: Mental status will improve Outcome: Not Progressing Note: Pt. Presents with concrete thought process, paranoid themes, and endorsed AH.    Problem: Health Behavior/Discharge Planning: Goal: Compliance with treatment plan for underlying cause of condition will improve Outcome: Progressing Note: Pt. Able to comply with treatment medications.    Problem: Safety: Goal: Periods of time without injury will increase Outcome: Progressing Note: Pt. Has been able to  remain safe on the unit. Pt. Denied si/hi.

## 2020-05-17 NOTE — Progress Notes (Signed)
Important Message  Patient Details  Name: KASHIS PENLEY MRN: [id number] Date of Birth: [birth year]   Medicare Important Message Given:  (P) Yes, CSW informed patient of right to appeal discharge and given contact information for [facility]. Patient expressed interest in appealing discharge during conversation; CSW will remain available for further instruction.    Corky Crafts, LCSWA 05/17/2020, 11:24 AM

## 2020-05-17 NOTE — Progress Notes (Signed)
Patient is alert and oriented x 4.  He is pleasnat and easy to engage in conversation.  He has been able to speak to family members and has a much better disposition. He is med compliant and tolerated his meds without incident. He denies SI  HI  AVH and pain at this encounter.  He continues to endorse depression and anxiety but states that he is feeling better.     Cleo Butler-Nicholson, LPN

## 2020-05-17 NOTE — Plan of Care (Signed)
  Problem: Education: Goal: Knowledge of [facility] Education information/materials will improve Outcome: Progressing Goal: Emotional status will improve Outcome: Progressing Goal: Mental status will improve Outcome: Progressing Goal: Verbalization of understanding the information provided will improve Outcome: Progressing   Problem: Activity: Goal: Interest or engagement in activities will improve Outcome: Progressing Goal: Sleeping patterns will improve Outcome: Progressing   Problem: Coping: Goal: Ability to verbalize frustrations and anger appropriately will improve Outcome: Progressing Goal: Ability to demonstrate self-control will improve Outcome: Progressing   Problem: Self-Concept: Goal: Will verbalize positive feelings about self Outcome: Progressing Goal: Level of anxiety will decrease Outcome: Progressing   Problem: Safety: Goal: Ability to disclose and discuss suicidal ideas will improve Outcome: Progressing Goal: Ability to identify and utilize support systems that promote safety will improve Outcome: Progressing   

## 2020-05-17 NOTE — Progress Notes (Signed)
[facility] MD Progress Note  05/17/2020 10:35 AM Kenneth Jensen  MRN:  [id number]   CC "My tooth hurts"  Subjective:  30 year old male with schizoaffective disorder presenting voluntarily to emergency room for suicidal ideations. No acute events overnight, medication compliant, and attending to ADLs.   This morning patient seen one-on-one at bedside. He reports some tooth pain in his rear molar, which does appear to have a cavity. He denies seeing a dentist in recent past. Will provide orajel, and encouraged patient to see dentist upon discharge. CSW was able to get into contact with Daniel Nones from his ACT Team. They have been pursuing placement for him that should occur toward the end of the month vs. Beginning of June. Also relayed that he is able to return to his friends house per ACT Team. He requests to stay in the hospital until this occurred instead of returning to his friends. On explanation that we cannot keep him that long he does become quite agitated stating that he will sue the hospital. Informed patient that the plan was to discharge him tomorrow, and that he will need to think about whether he wants to return to his friends, vs a homeless shelter. Of note, ACT Team states he has been medication compliant, and is at his mental health baseline. They feel he is using the system to avoid homelessness. They do not have any concerns about discharge.    Principal Problem: Schizoaffective disorder, bipolar type (HCC) Diagnosis: Principal Problem:   Schizoaffective disorder, bipolar type (HCC) Active Problems:   Cannabis use disorder, moderate, dependence (HCC)   Tobacco use disorder   Prediabetes  Total Time spent with patient: 20 minutes  Past Psychiatric History: See H&P  Past Medical History:  Past Medical History:  Diagnosis Date  . Asthma   . Bipolar 1 disorder (HCC)   . Depression   . Schizophrenia (HCC)   . Schizotaxia    History reviewed. No pertinent surgical  history. Family History: History reviewed. No pertinent family history. Family Psychiatric  History: See H&P Social History:  Social History   Substance and Sexual Activity  Alcohol Use Yes   Comment: social     Social History   Substance and Sexual Activity  Drug Use Yes  . Types: Marijuana    Social History   Socioeconomic History  . Marital status: Single    Spouse name: Not on file  . Number of children: Not on file  . Years of education: Not on file  . Highest education level: Not on file  Occupational History  . Not on file  Tobacco Use  . Smoking status: Current Every Day Smoker  . Smokeless tobacco: Never Used  Substance and Sexual Activity  . Alcohol use: Yes    Comment: social  . Drug use: Yes    Types: Marijuana  . Sexual activity: Not on file  Other Topics Concern  . Not on file  Social History Narrative   ** Merged History Encounter **       Social Determinants of Health   Financial Resource Strain: Not on file  Food Insecurity: Not on file  Transportation Needs: Not on file  Physical Activity: Not on file  Stress: Not on file  Social Connections: Not on file   Additional Social History:    Sleep: Good  Appetite:  Fair  Current Medications: Current Facility-Administered Medications  Medication Dose Route Frequency Provider Last Rate Last Admin  . acetaminophen (TYLENOL) tablet 650 mg  650 mg  Oral Q6H PRN Clapacs, Jackquline Denmark, MD   650 mg at 05/17/20 0151  . alum & mag hydroxide-simeth (MAALOX/MYLANTA) 200-200-20 MG/5ML suspension 30 mL  30 mL Oral Q4H PRN Clapacs, John T, MD      . benzocaine (ORAJEL) 10 % mucosal gel   Mouth/Throat TID PRN Jesse Sans, MD      . divalproex (DEPAKOTE ER) 24 hr tablet 500 mg  500 mg Oral TID Clapacs, Jackquline Denmark, MD   500 mg at 05/17/20 0910  . hydrOXYzine (ATARAX/VISTARIL) tablet 50 mg  50 mg Oral TID PRN Clapacs, Jackquline Denmark, MD   50 mg at 05/17/20 0153  . magnesium hydroxide (MILK OF MAGNESIA) suspension 30 mL  30  mL Oral Daily PRN Clapacs, John T, MD      . nicotine (NICODERM CQ - dosed in mg/24 hours) patch 21 mg  21 mg Transdermal Daily Jesse Sans, MD   21 mg at 05/17/20 0713  . QUEtiapine (SEROQUEL) tablet 200 mg  200 mg Oral QHS Clapacs, John T, MD   200 mg at 05/16/20 2116  . traZODone (DESYREL) tablet 50 mg  50 mg Oral QHS PRN Clapacs, Jackquline Denmark, MD   50 mg at 05/16/20 2116    Lab Results:  Results for orders placed or performed during the hospital encounter of 05/14/20 (from the past 48 hour(s))  Lipid panel     Status: Abnormal   Collection Time: 05/15/20 10:51 AM  Result Value Ref Range   Cholesterol 118 0 - 200 mg/dL   Triglycerides 98 <119 mg/dL   HDL 31 (L) >41 mg/dL   Total CHOL/HDL Ratio 3.8 RATIO   VLDL 20 0 - 40 mg/dL   LDL Cholesterol 67 0 - 99 mg/dL    Comment:        Total Cholesterol/HDL:CHD Risk Coronary Heart Disease Risk Table                     Men   Women  1/2 Average Risk   3.4   3.3  Average Risk       5.0   4.4  2 X Average Risk   9.6   7.1  3 X Average Risk  23.4   11.0        Use the calculated Patient Ratio above and the CHD Risk Table to determine the patient's CHD Risk.        ATP III CLASSIFICATION (LDL):  <100     mg/dL   Optimal  740-814  mg/dL   Near or Above                    Optimal  130-159  mg/dL   Borderline  481-856  mg/dL   High  >314     mg/dL   Very High Performed at [facility], [address] [place], [place] [address]   Hemoglobin A1c     Status: Abnormal   Collection Time: 05/15/20 10:51 AM  Result Value Ref Range   Hgb A1c MFr Bld 5.7 (H) 4.8 - 5.6 %    Comment: (NOTE) Pre diabetes:          5.7%-6.4%  Diabetes:              >6.4%  Glycemic control for   <7.0% adults with diabetes    Mean Plasma Glucose 116.89 mg/dL    Comment: Performed at [facility], [address] [place], [place]  [address]    Blood Alcohol level:  Lab Results  Component Value Date   ETH <10 05/12/2020   ETH <10  11/04/2019    Metabolic Disorder Labs: Lab Results  Component Value Date   HGBA1C 5.7 (H) 05/15/2020   MPG 116.89 05/15/2020   No results found for: PROLACTIN Lab Results  Component Value Date   CHOL 118 05/15/2020   TRIG 98 05/15/2020   HDL 31 (L) 05/15/2020   CHOLHDL 3.8 05/15/2020   VLDL 20 05/15/2020   LDLCALC 67 05/15/2020   LDLCALC 75 08/09/2018    Physical Findings: AIMS:  , ,  ,  ,    CIWA:    COWS:     Musculoskeletal: Strength & Muscle Tone: within normal limits Gait & Station: normal Patient leans: N/A  Psychiatric Specialty Exam:  Presentation  General Appearance: Fairly Groomed  Eye Contact:Good  Speech:Normal Rate  Speech Volume:Normal  Handedness:Right   Mood and Affect  Mood:Euthymic  Affect:Congruent   Thought Process  Thought Processes:Goal Directed  Descriptions of Associations:Intact  Orientation:Full (Time, Place and Person)  Thought Content:Paranoid Ideation  History of Schizophrenia/Schizoaffective disorder:Yes  Duration of Psychotic Symptoms:Greater than six months  Hallucinations:Hallucinations: Auditory  Ideas of Reference:Paranoia  Suicidal Thoughts:Suicidal Thoughts: No  Homicidal Thoughts:Homicidal Thoughts: No   Sensorium  Memory:Immediate Good; Recent Good; Remote Good  Judgment:Poor  Insight:Shallow   Executive Functions  Concentration:Fair  Attention Span:Fair  Recall:Fair  Fund of Knowledge:Fair  Language:Fair   Psychomotor Activity  Psychomotor Activity:Psychomotor Activity: Normal   Assets  Assets:Communication Skills; Desire for Improvement; Financial Resources/Insurance; Resilience; Social Support   Sleep  Sleep:Sleep: Fair Number of Hours of Sleep: 7.45    Physical Exam: Physical Exam  ROS  Blood pressure (!) 141/71, pulse 68, temperature 97.7 F (36.5 C), temperature source Oral, resp. rate 18, height 5\' 5"  (1.651 m), weight 94.8 kg, SpO2 100 %. Body mass index is  34.78 kg/m.   Treatment Plan Summary: Daily contact with patient to assess and evaluate symptoms and progress in treatment and Medication management 30 year old male with schizoaffective disorder, bipolar type presenting with suicidal ideations and auditory hallucinations that are derogatory in nature. Continue Depakote 500 mg TID (level 84 on 05/12/20), seroquel 200 mg QHS, and trazodone 50 mg. Patient received 07/12/20 Sustenna 234 mg IM injection prior to admission.Nicoderm 21 mg patch for tobacco use disorder. Plan to discharge tomorrow. Patient given today to decide if he would like to return home vs. Have Hinda Glatter arrange to transfer to homeless shelter.   [place], MD 05/17/2020, 10:35 AM

## 2020-05-17 NOTE — Progress Notes (Signed)
Recreation Therapy Notes    Date: 05/17/2020   Time:9:30am   Location: Craft room   Behavioral response: N/A   Intervention Topic: Strengths     Discussion/Intervention: Patient did not attend group.   Clinical Observations/Feedback:  Patient did not attend group.   Sharmaine Bain LRT/CTRS          Myrical Andujo 05/17/2020 10:09 AM

## 2020-05-18 MED ORDER — BENZOCAINE 10 % MT GEL: Freq: Three times a day (TID) | OROMUCOSAL | 0 refills | Status: DC | PRN

## 2020-05-18 MED ORDER — DIVALPROEX SODIUM ER 500 MG PO TB24: 500.0000 mg | ORAL_TABLET | Freq: Three times a day (TID) | ORAL | 1 refills | Status: DC

## 2020-05-18 MED ORDER — TRAZODONE HCL 50 MG PO TABS: 50.0000 mg | ORAL_TABLET | Freq: Every evening | ORAL | 1 refills | Status: DC | PRN

## 2020-05-18 MED ORDER — QUETIAPINE FUMARATE 200 MG PO TABS: 200.0000 mg | ORAL_TABLET | Freq: Every day | ORAL | 1 refills | Status: DC

## 2020-05-18 NOTE — Progress Notes (Signed)
Patient denies SI/HI, denies A/V hallucinations. Patient verbalizes understanding of discharge instructions, follow up care and prescriptions. Patient given all belongings from [facility] locker. Patient escorted out by staff, transported by safe transport. 

## 2020-05-18 NOTE — BHH Suicide Risk Assessment (Signed)
[facility] Discharge Suicide Risk Assessment   Principal Problem: Schizoaffective disorder, bipolar type (HCC) Discharge Diagnoses: Principal Problem:   Schizoaffective disorder, bipolar type (HCC) Active Problems:   Cannabis use disorder, moderate, dependence (HCC)   Tobacco use disorder   Prediabetes   Total Time spent with patient: 35 minutes- 25 minutes face-to-face contact with patient, 10 minutes documentation, coordination of care, scripts   Musculoskeletal: Strength & Muscle Tone: within normal limits Gait & Station: normal Patient leans: N/A  Psychiatric Specialty Exam  Presentation  General Appearance: Fairly Groomed  Eye Contact:Good  Speech:Normal Rate  Speech Volume:Normal  Handedness:Right   Mood and Affect  Mood:Euthymic  Duration of Depression Symptoms: Greater than two weeks  Affect:Congruent   Thought Process  Thought Processes:Goal Directed; Linear  Descriptions of Associations:Intact  Orientation:Full (Time, Place and Person)  Thought Content:Logical  History of Schizophrenia/Schizoaffective disorder:Yes  Duration of Psychotic Symptoms:Greater than six months  Hallucinations:Hallucinations: None  Ideas of Reference:None  Suicidal Thoughts:Suicidal Thoughts: No  Homicidal Thoughts:Homicidal Thoughts: No   Sensorium  Memory:Immediate Good; Recent Good; Remote Good  Judgment:Intact  Insight:Shallow   Executive Functions  Concentration:Good  Attention Span:Good  Recall:Good  Fund of Knowledge:Good  Language:Good   Psychomotor Activity  Psychomotor Activity:Psychomotor Activity: Normal   Assets  Assets:Communication Skills; Desire for Improvement; Financial Resources/Insurance; Housing; Leisure Time; Physical Health; Resilience; Social Support   Sleep  Sleep:Sleep: Good Number of Hours of Sleep: 7.55   Physical Exam: Physical Exam ROS Blood pressure 118/76, pulse 72, temperature 97.7 F (36.5 C), temperature  source Oral, resp. rate 18, height 5\' 5"  (1.651 m), weight 94.8 kg, SpO2 98 %. Body mass index is 34.78 kg/m.  Mental Status Per Nursing Assessment::   On Admission:  NA  Demographic Factors:  Male  Loss Factors: NA  Historical Factors: Impulsivity  Risk Reduction Factors:   Sense of responsibility to family, Positive social support, Positive therapeutic relationship and Positive coping skills or problem solving skills  Continued Clinical Symptoms:  Schizophrenia:   Paranoid or undifferentiated type Previous Psychiatric Diagnoses and Treatments  Cognitive Features That Contribute To Risk:  None    Suicide Risk:  Minimal: No identifiable suicidal ideation.  Patients presenting with no risk factors but with morbid ruminations; may be classified as minimal risk based on the severity of the depressive symptoms   Follow-up Information    [facility] Follow up.   Why: ACCT member will meet you at [facility] for your schdeduled appointment between 2pm-5p. Thanks! Contact information: [address] [place] [phone]               Plan Of Care/Follow-up recommendations:  Activity:  as tolerated Diet:  low sodium heart healthy diet  [phone], MD 05/18/2020, 9:22 AM

## 2020-05-18 NOTE — Progress Notes (Signed)
  [facility] Adult Case Management Discharge Plan :  Will you be returning to the same living situation after discharge:  No. At discharge, do you have transportation home?: Yes,  safe transport Do you have the ability to pay for your medications: Yes,  Medicare  Release of information consent forms completed and in the chart;  Patient's signature needed at discharge.  Patient to Follow up at:  Follow-up Information    [facility] Follow up.   Why: ACCT member will meet you at [facility] for your schdeduled appointment between 2pm-5p. Thanks! Contact information: [address] [place] [address] [phone]               Next level of care provider has access to [facility] Link:no  Safety Planning and Suicide Prevention discussed: Yes,  pt's aunt  Have you used any form of tobacco in the last 30 days? (Cigarettes, Smokeless Tobacco, Cigars, and/or Pipes): Yes  Has patient been referred to the Quitline?: Patient refused referral  Patient has been referred for addiction treatment: Pt. refused referral  Chanah Tidmore A [place], LCSWA 05/18/2020, 9:20 AM

## 2020-05-18 NOTE — BHH Counselor (Signed)
CSW reached out to [facility] ACTT team lead Daniel Nones [phone]). Voicemail left with contact information for follow through.   Vilma Meckel. Algis Greenhouse, MSW, LCSW, LCAS 05/18/2020 10:34 AM

## 2020-05-18 NOTE — BHH Group Notes (Signed)
LCSW Group Therapy Note  05/18/2020 2:06 PM  Type of Therapy and Topic:  Group Therapy:  Feelings around Relapse and Recovery  Participation Level:  Active   Description of Group:    Patients in this group will discuss emotions they experience before and after a relapse. They will process how experiencing these feelings, or avoidance of experiencing them, relates to having a relapse. Facilitator will guide patients to explore emotions they have related to recovery. Patients will be encouraged to process which emotions are more powerful. They will be guided to discuss the emotional reaction significant others in their lives may have to their relapse or recovery. Patients will be assisted in exploring ways to respond to the emotions of others without this contributing to a relapse.  Therapeutic Goals: 1. Patient will identify two or more emotions that lead to a relapse for them 2. Patient will identify two emotions that result when they relapse 3. Patient will identify two emotions related to recovery 4. Patient will demonstrate ability to communicate their needs through discussion and/or role plays   Summary of Patient Progress: Patient was present for the entirety of the group session. Patient was an active listener and participated in the topic of discussion, albeit slightly tangential and difficult to redirect.     Therapeutic Modalities:   Cognitive Behavioral Therapy Solution-Focused Therapy Assertiveness Training Relapse Prevention Therapy   Gwenevere Ghazi, MSW, [place], [place] 05/18/2020 2:06 PM

## 2020-05-18 NOTE — Plan of Care (Signed)
  Problem: Education: Goal: Knowledge of [facility] Education information/materials will improve Outcome: Progressing Goal: Emotional status will improve Outcome: Progressing Goal: Mental status will improve Outcome: Progressing Goal: Verbalization of understanding the information provided will improve Outcome: Progressing   Problem: Activity: Goal: Interest or engagement in activities will improve Outcome: Progressing Goal: Sleeping patterns will improve Outcome: Progressing   Problem: Coping: Goal: Ability to verbalize frustrations and anger appropriately will improve Outcome: Progressing Goal: Ability to demonstrate self-control will improve Outcome: Progressing   Problem: Health Behavior/Discharge Planning: Goal: Identification of resources available to assist in meeting health care needs will improve Outcome: Progressing Goal: Compliance with treatment plan for underlying cause of condition will improve Outcome: Progressing   Problem: Physical Regulation: Goal: Ability to maintain clinical measurements within normal limits will improve Outcome: Progressing   Problem: Safety: Goal: Periods of time without injury will increase Outcome: Progressing   Problem: Education: Goal: Utilization of techniques to improve thought processes will improve Outcome: Progressing Goal: Knowledge of the prescribed therapeutic regimen will improve Outcome: Progressing   Problem: Activity: Goal: Interest or engagement in leisure activities will improve Outcome: Progressing Goal: Imbalance in normal sleep/wake cycle will improve Outcome: Progressing   Problem: Coping: Goal: Coping ability will improve Outcome: Progressing Goal: Will verbalize feelings Outcome: Progressing   Problem: Health Behavior/Discharge Planning: Goal: Ability to make decisions will improve Outcome: Progressing Goal: Compliance with therapeutic regimen will improve Outcome: Progressing    Problem: Role Relationship: Goal: Will demonstrate positive changes in social behaviors and relationships Outcome: Progressing   Problem: Safety: Goal: Ability to disclose and discuss suicidal ideas will improve Outcome: Progressing Goal: Ability to identify and utilize support systems that promote safety will improve Outcome: Progressing   Problem: Self-Concept: Goal: Will verbalize positive feelings about self Outcome: Progressing Goal: Level of anxiety will decrease Outcome: Progressing   

## 2020-05-18 NOTE — Progress Notes (Signed)
Recreation Therapy Notes   Date: 05/18/2020  Time: 9:30 am   Location: Craft room  Behavioral response: N/A   Intervention Topic: Happiness    Discussion/Intervention: Patient did not attend group.   Clinical Observations/Feedback:  Patient did not attend group.   Kobi Mario LRT/CTRS        Kenneth Jensen 05/18/2020 11:30 AM 

## 2020-05-18 NOTE — Progress Notes (Signed)
Patient has been complaining of a tooth ache, asking to go to the emergency room. Notified NP who ordered Ibuprofen 800 mg. Patient received tylenol at beginning of shift and later ibuprofen with relief. Denies SI, HI and AVH.

## 2020-05-18 NOTE — Progress Notes (Signed)
Recreation Therapy Notes  INPATIENT RECREATION TR PLAN  Patient Details Name: Kenneth Jensen MRN: [id number] DOB: [birth year] Today's Date: 05/18/2020  Rec Therapy Plan Is patient appropriate for Therapeutic Recreation?: Yes Treatment times per week: at least 3 Estimated Length of Stay: 5-7 days TR Treatment/Interventions: Group participation (Comment)  Discharge Criteria Pt will be discharged from therapy if:: Discharged Treatment plan/goals/alternatives discussed and agreed upon by:: Patient/family  Discharge Summary Short term goals set: Patient will engage in groups without prompting or encouragement from LRT x3 group sessions within 5 recreation therapy group sessions Short term goals met: Not met Reason goals not met: Patient did not attend any groups Therapeutic equipment acquired: N/A Reason patient discharged from therapy: Discharge from hospital Pt/family agrees with progress & goals achieved: Yes Date patient discharged from therapy: 05/18/20   [name] 05/18/2020, 12:14 PM

## 2020-05-18 NOTE — Discharge Summary (Signed)
Physician Discharge Summary Note  Patient:  Kenneth Jensen is an 30 y.o., male MRN:  [id number] DOB:  09-Dec-1990 Patient phone:  [phone] (home)  Patient address:   [facility] [place] [address],  Total Time spent with patient: 35 minutes- 25 minutes face-to-face contact with patient, 10 minutes documentation, coordination of care, scripts   Date of Admission:  05/14/2020 Date of Discharge: 05/18/2020  Reason for Admission:  30 year old male with schizoaffective disorder presenting voluntarily to emergency room for suicidal ideations.  Principal Problem: Schizoaffective disorder, bipolar type [facility]) Discharge Diagnoses: Principal Problem:   Schizoaffective disorder, bipolar type (HCC) Active Problems:   Cannabis use disorder, moderate, dependence (HCC)   Tobacco use disorder   Prediabetes   Past Psychiatric History:  Currently followed by [facility] ACT team. History of prior admissions. Current medications include Depakote, invega sustenna 234 mg IM injection, seroquel, and trazodone. He denies any history of suicide attempts. Does have some history of fighting and aggression when he is decompensated. Smokes mariajuana and cigarettes, denies any other substance abuse.   Past Medical History:  Past Medical History:  Diagnosis Date  . Asthma   . Bipolar 1 disorder (HCC)   . Depression   . Schizophrenia (HCC)   . Schizotaxia    History reviewed. No pertinent surgical history. Family History: History reviewed. No pertinent family history. Family Psychiatric  History: None reported Social History:  Social History   Substance and Sexual Activity  Alcohol Use Yes   Comment: social     Social History   Substance and Sexual Activity  Drug Use Yes  . Types: Marijuana    Social History   Socioeconomic History  . Marital status: Single    Spouse name: Not on file  . Number of children: Not on file  . Years of education: Not on file  . Highest education level: Not on  file  Occupational History  . Not on file  Tobacco Use  . Smoking status: Current Every Day Smoker  . Smokeless tobacco: Never Used  Substance and Sexual Activity  . Alcohol use: Yes    Comment: social  . Drug use: Yes    Types: Marijuana  . Sexual activity: Not on file  Other Topics Concern  . Not on file  Social History Narrative   ** Merged History Encounter **       Social Determinants of Health   Financial Resource Strain: Not on file  Food Insecurity: Not on file  Transportation Needs: Not on file  Physical Activity: Not on file  Stress: Not on file  Social Connections: Not on file    Hospital Course:   30 year old male with schizoaffective disorder presented voluntarily to emergency room for suicidal ideations. He reported auditory hallucinations of voices telling him he's worthless and going to be homeless forever, and that he is going to start smoking crack. He was continued on all home medications without changes. Collateral obtained from ACT Team who reported medication compliance at home. This was evidenced by therapeutic valproic acid level of 84 05/12/20 on arrival to the emergency room. They felt he was at his psychiatric baseline, and using the hospital to obtain housing and avoid homelessness. They were able to arrange long-term hotel for patient, and he rapidly denied all symptoms and stated he was ready for discharge upon hearing this information. He denies SI/HI/AH/VH. Patient, treatment team, and ACT Team feel he is safe for discharge with outpatient follow-up. Team will meet him at hotel today.  Physical Findings: AIMS: Facial and Oral Movements Muscles of Facial Expression: None, normal Lips and Perioral Area: None, normal Jaw: None, normal Tongue: None, normal,Extremity Movements Upper (arms, wrists, hands, fingers): None, normal Lower (legs, knees, ankles, toes): None, normal, Trunk Movements Neck, shoulders, hips: None, normal, Overall Severity Severity  of abnormal movements (highest score from questions above): None, normal Incapacitation due to abnormal movements: None, normal Patient's awareness of abnormal movements (rate only patient's report): No Awareness, Dental Status Current problems with teeth and/or dentures?: No Does patient usually wear dentures?: No  CIWA:    COWS:     Musculoskeletal: Strength & Muscle Tone: within normal limits Gait & Station: normal Patient leans: N/A                Psychiatric Specialty Exam:  Presentation  General Appearance: Fairly Groomed  Eye Contact:Good  Speech:Normal Rate  Speech Volume:Normal  Handedness:Right   Mood and Affect  Mood:Euthymic  Affect:Congruent   Thought Process  Thought Processes:Goal Directed; Linear  Descriptions of Associations:Intact  Orientation:Full (Time, Place and Person)  Thought Content:Logical  History of Schizophrenia/Schizoaffective disorder:Yes  Duration of Psychotic Symptoms:Greater than six months  Hallucinations:Hallucinations: None  Ideas of Reference:None  Suicidal Thoughts:Suicidal Thoughts: No  Homicidal Thoughts:Homicidal Thoughts: No   Sensorium  Memory:Immediate Good; Recent Good; Remote Good  Judgment:Intact  Insight:Shallow   Executive Functions  Concentration:Good  Attention Span:Good  Recall:Good  Fund of Knowledge:Good  Language:Good   Psychomotor Activity  Psychomotor Activity:Psychomotor Activity: Normal   Assets  Assets:Communication Skills; Desire for Improvement; Financial Resources/Insurance; Housing; Leisure Time; Physical Health; Resilience; Social Support   Sleep  Sleep:Sleep: Good Number of Hours of Sleep: 7.55    Physical Exam: Physical Exam ROS Blood pressure 118/76, pulse 72, temperature 97.7 F (36.5 C), temperature source Oral, resp. rate 18, height 5\' 5"  (1.651 m), weight 94.8 kg, SpO2 98 %. Body mass index is 34.78 kg/m.   Have you used any form of  tobacco in the last 30 days? (Cigarettes, Smokeless Tobacco, Cigars, and/or Pipes): Yes  Has this patient used any form of tobacco in the last 30 days? (Cigarettes, Smokeless Tobacco, Cigars, and/or Pipes)  Yes, A prescription for an FDA-approved tobacco cessation medication was offered at discharge and the patient refused  Blood Alcohol level:  Lab Results  Component Value Date   [facility] <10 05/12/2020   ETH <10 11/04/2019    Metabolic Disorder Labs:  Lab Results  Component Value Date   HGBA1C 5.7 (H) 05/15/2020   MPG 116.89 05/15/2020   No results found for: PROLACTIN Lab Results  Component Value Date   CHOL 118 05/15/2020   TRIG 98 05/15/2020   HDL 31 (L) 05/15/2020   CHOLHDL 3.8 05/15/2020   VLDL 20 05/15/2020   LDLCALC 67 05/15/2020   LDLCALC 75 08/09/2018    See Psychiatric Specialty Exam and Suicide Risk Assessment completed by Attending Physician prior to discharge.  Discharge destination:  Other:  hotel  Is patient on multiple antipsychotic therapies at discharge:  Yes,   Do you recommend tapering to monotherapy for antipsychotics?  No   Has Patient had three or more failed trials of antipsychotic monotherapy by history:  Yes,   Antipsychotic medications that previously failed include:   1.  Invega. and 2.  Seroquel.  Recommended Plan for Multiple Antipsychotic Therapies: NA  Discharge Instructions    Diet - low sodium heart healthy   Complete by: As directed    Increase activity slowly   Complete by:  As directed      Allergies as of 05/18/2020      Reactions   Haldol [haloperidol]    Pt states "I went crazy"   Percocet [oxycodone-acetaminophen]    Vicodin [hydrocodone-acetaminophen] Itching   Vicodin [hydrocodone-acetaminophen]       Medication List    TAKE these medications     Indication  benzocaine 10 % mucosal gel Commonly known as: ORAJEL Use as directed in the mouth or throat 3 (three) times daily as needed for mouth pain (tooth pain).   Indication: Toothache   divalproex 500 MG 24 hr tablet Commonly known as: DEPAKOTE ER Take 1 tablet (500 mg total) by mouth 3 (three) times daily.  Indication: Schizoaffective   paliperidone 234 MG/1.5ML Susy injection Commonly known as: INVEGA SUSTENNA Inject 234 mg into the muscle every 30 (thirty) days.  Indication: Schizophrenia   QUEtiapine 200 MG tablet Commonly known as: SEROQUEL Take 1 tablet (200 mg total) by mouth at bedtime.  Indication: Schizoaffective   traZODone 50 MG tablet Commonly known as: DESYREL Take 1 tablet (50 mg total) by mouth at bedtime as needed for sleep.  Indication: Trouble Sleeping       Follow-up Information    [facility] Follow up.   Why: ACCT member will meet you at [facility] for your schdeduled appointment between 2pm-5p. Thanks! Contact information: [address] [place] [address] [phone]               Follow-up recommendations:  Activity:  as tolerated Diet:  low sodium heart healthy diet  Comments:  30-day scripts with 1 refill printed and provided to patient at discharge.   Signed: Jesse Sans, MD 05/18/2020, 9:27 AM

## 2020-11-26 DIAGNOSIS — Z202 Contact with and (suspected) exposure to infections with a predominantly sexual mode of transmission: Secondary | ICD-10-CM | POA: Diagnosis not present

## 2020-11-26 DIAGNOSIS — R195 Other fecal abnormalities: Secondary | ICD-10-CM | POA: Diagnosis not present

## 2020-11-26 DIAGNOSIS — Z113 Encounter for screening for infections with a predominantly sexual mode of transmission: Secondary | ICD-10-CM | POA: Diagnosis not present

## 2020-11-26 DIAGNOSIS — L739 Follicular disorder, unspecified: Secondary | ICD-10-CM | POA: Diagnosis not present

## 2020-11-26 DIAGNOSIS — R030 Elevated blood-pressure reading, without diagnosis of hypertension: Secondary | ICD-10-CM | POA: Diagnosis not present

## 2020-11-26 DIAGNOSIS — F209 Schizophrenia, unspecified: Secondary | ICD-10-CM | POA: Diagnosis not present

## 2020-11-28 DIAGNOSIS — R195 Other fecal abnormalities: Secondary | ICD-10-CM | POA: Diagnosis not present

## 2020-11-30 ENCOUNTER — Emergency Department
Admission: EM | Admit: 2020-11-30 | Discharge: 2020-11-30 | Disposition: A | Discharge status: Home / Self Care | Payer: Medicare HMO | Attending: Emergency Medicine | Admitting: Emergency Medicine

## 2020-11-30 ENCOUNTER — Encounter: Payer: Self-pay | Admitting: Emergency Medicine

## 2020-11-30 ENCOUNTER — Other Ambulatory Visit: Payer: Self-pay

## 2020-11-30 DIAGNOSIS — Z046 Encounter for general psychiatric examination, requested by authority: Secondary | ICD-10-CM | POA: Diagnosis not present

## 2020-11-30 DIAGNOSIS — F209 Schizophrenia, unspecified: Secondary | ICD-10-CM | POA: Diagnosis not present

## 2020-11-30 DIAGNOSIS — S80261A Insect bite (nonvenomous), right knee, initial encounter: Secondary | ICD-10-CM | POA: Diagnosis not present

## 2020-11-30 DIAGNOSIS — Z23 Encounter for immunization: Secondary | ICD-10-CM | POA: Insufficient documentation

## 2020-11-30 DIAGNOSIS — N4821 Abscess of corpus cavernosum and penis: Secondary | ICD-10-CM | POA: Insufficient documentation

## 2020-11-30 DIAGNOSIS — S80262A Insect bite (nonvenomous), left knee, initial encounter: Secondary | ICD-10-CM | POA: Diagnosis not present

## 2020-11-30 DIAGNOSIS — Z5321 Procedure and treatment not carried out due to patient leaving prior to being seen by health care provider: Secondary | ICD-10-CM | POA: Insufficient documentation

## 2020-11-30 DIAGNOSIS — W57XXXA Bitten or stung by nonvenomous insect and other nonvenomous arthropods, initial encounter: Secondary | ICD-10-CM | POA: Diagnosis not present

## 2020-11-30 MED ORDER — TETANUS-DIPHTH-ACELL PERTUSSIS 5-2.5-18.5 LF-MCG/0.5 IM SUSY
0.5000 mL | PREFILLED_SYRINGE | Freq: Once | INTRAMUSCULAR | Status: AC
  Administered 2020-11-30: 0.5 mL via INTRAMUSCULAR
  Filled 2020-11-30: qty 0.5

## 2020-11-30 NOTE — ED Triage Notes (Addendum)
Pt states that they can hear voices. Pt states they have a hx of schizophrenia. Pt states that they have been taking their medication. Pt also c/o of "bug bites". Pt has 2 scraps one on the right and one of the left knee. Pt denies of SI and HI.   Pt also has c/o of abscess near penis but does not want to disrobe in triage, MD aware and told him to wait until he gets back to a room.   Pt calm and cooperative in triage. Pt voluntary at this time.

## 2020-11-30 NOTE — ED Notes (Signed)
Lab called to assist with blood draw due to pt being stuck x2 with no success.

## 2020-11-30 NOTE — ED Notes (Signed)
Pt not found in SWA, lobby, or outside at this time. There is no phone number listed for pt in order for me to call him. Per registration, they think pt has left.

## 2020-11-30 NOTE — ED Provider Notes (Signed)
HPI: Pt is a 30 y.o. male who presents with complaints of schizophrenia  The patient p/w  schizophrenia- reports hearing voices after getting upset at family. Reports no one to stay with to help him and no car. Denies si hi voices telling him to hurt self or other concerns.  Scabs on his kneee. Not sure how he got them.  ROS: Denies fever, chest pain, vomiting  Past Medical History:  Diagnosis Date   Asthma    Bipolar 1 disorder (HCC)    Depression    Schizophrenia (HCC)    Schizotaxia    There were no vitals filed for this visit.  Focused Physical Exam: Gen: No acute distress Head: atraumatic, normocephalic Eyes: Extraocular movements grossly intact; conjunctiva clear CV: RRR Lung: No increased WOB, no stridor GI: ND, no obvious masses Neuro: Alert and awake Msk; scabs on knee- able to ambulate   Medical Decision Making and Plan: Given the patient's initial medical screening exam, the following diagnostic evaluation has been ordered. The patient will be placed in the appropriate treatment space, once one is available, to complete the evaluation and treatment. I have discussed the plan of care with the patient and I have advised the patient that an ED physician or mid-level practitioner will reevaluate their condition after the test results have been received, as the results may give them additional insight into the type of treatment they may need.   Diagnostics: tetanus, vol- pt not danger to self or others here voluntary no indication for IVC.   Treatments: none immediately   Concha Se, MD 11/30/20 1640

## 2021-01-23 DIAGNOSIS — Z20822 Contact with and (suspected) exposure to covid-19: Secondary | ICD-10-CM | POA: Diagnosis not present

## 2021-03-03 ENCOUNTER — Emergency Department
Admission: EM | Admit: 2021-03-03 | Discharge: 2021-03-03 | Disposition: A | Discharge status: Home / Self Care | Payer: Medicare HMO | Attending: Emergency Medicine | Admitting: Emergency Medicine

## 2021-03-03 ENCOUNTER — Encounter: Payer: Self-pay | Admitting: Emergency Medicine

## 2021-03-03 ENCOUNTER — Other Ambulatory Visit: Payer: Self-pay

## 2021-03-03 DIAGNOSIS — S0993XD Unspecified injury of face, subsequent encounter: Secondary | ICD-10-CM | POA: Diagnosis present

## 2021-03-03 DIAGNOSIS — T783XXA Angioneurotic edema, initial encounter: Secondary | ICD-10-CM | POA: Diagnosis not present

## 2021-03-03 DIAGNOSIS — R52 Pain, unspecified: Secondary | ICD-10-CM | POA: Diagnosis not present

## 2021-03-03 DIAGNOSIS — S025XXD Fracture of tooth (traumatic), subsequent encounter for fracture with routine healing: Secondary | ICD-10-CM | POA: Diagnosis not present

## 2021-03-03 DIAGNOSIS — S025XXG Fracture of tooth (traumatic), subsequent encounter for fracture with delayed healing: Secondary | ICD-10-CM

## 2021-03-03 DIAGNOSIS — X58XXXD Exposure to other specified factors, subsequent encounter: Secondary | ICD-10-CM | POA: Insufficient documentation

## 2021-03-03 DIAGNOSIS — R609 Edema, unspecified: Secondary | ICD-10-CM | POA: Diagnosis not present

## 2021-03-03 MED ORDER — KETOROLAC TROMETHAMINE 30 MG/ML IJ SOLN
30.0000 mg | Freq: Once | INTRAMUSCULAR | Status: AC
  Administered 2021-03-03: 30 mg via INTRAMUSCULAR
  Filled 2021-03-03: qty 1

## 2021-03-03 MED ORDER — CLINDAMYCIN HCL 150 MG PO CAPS
300.0000 mg | ORAL_CAPSULE | Freq: Once | ORAL | Status: AC
  Administered 2021-03-03: 300 mg via ORAL
  Filled 2021-03-03: qty 2

## 2021-03-03 MED ORDER — CLINDAMYCIN HCL 300 MG PO CAPS: 300.0000 mg | ORAL_CAPSULE | Freq: Three times a day (TID) | ORAL | 0 refills | Status: AC

## 2021-03-03 MED ORDER — LIDOCAINE VISCOUS HCL 2 % MT SOLN: 15.0000 mL | OROMUCOSAL | 0 refills | Status: AC | PRN

## 2021-03-03 MED ORDER — IBUPROFEN 800 MG PO TABS: 800.0000 mg | ORAL_TABLET | Freq: Three times a day (TID) | ORAL | 0 refills | Status: DC | PRN

## 2021-03-03 NOTE — ED Triage Notes (Addendum)
FIRST NURSE NOTE:  Pt arrived via [facility] from home with reports of dental pain, was seen somewhere and given an RX for amoxicillin 1 day ago but has not started.   Per EMS pt cannot read or write. Pt upset on arrival that he was placed in lobby and not in a room. Pt ambulatory without dificulty.  VSS  Pt has hx of schizophrenia

## 2021-03-03 NOTE — ED Triage Notes (Signed)
Pt to ED via [facility] with c/o dental pain. Pt c/o L sided dental pain at this time. Pt states was seen at the dentist and needs to have the tooth pulled. Pt states feels like someone "is stabbing me in the mouth". Pt ambulatory from EMS bay to lobby without difficulty. Pt able to speak in full and complete sentences without difficulty in triage.

## 2021-03-03 NOTE — ED Provider Notes (Signed)
[facility] Provider Note    Event Date/Time   First MD Initiated Contact with Patient 03/03/21 [phone]     (approximate)   History   Dental Pain   HPI  Kenneth Jensen is a 31 y.o. male presents to the ER today with complaint of dental pain.  He reports this started 3 days ago.  He describes the pain as throbbing.  The pain worsens when he tries to chew, open his mouth or talk.  He reports some associated left-sided facial swelling.  He denies fever, chills or body aches.  He reports he saw his dentist on Friday who advised him that the tooth would need to come out however he would like him to complete a course of antibiotics prior to pulling the tooth.  He was started on Amoxicillin.  Patient reports he took 2 doses of Amoxicillin and his tongue started to swell.  He denies swelling of the throat, difficulty breathing, shortness of breath or chest pain.  He has not taken any additional medications OTC for this.  He has no prior penicillin allergy.      Physical Exam   Triage Vital Signs: ED Triage Vitals  Enc Vitals Group     BP 03/03/21 0145 (!) 147/100     Pulse Rate 03/03/21 0145 64     Resp 03/03/21 0145 16     Temp 03/03/21 0145 98.5 F (36.9 C)     Temp Source 03/03/21 0145 Oral     SpO2 03/03/21 0145 98 %     Weight --      Height --      Head Circumference --      Peak Flow --      Pain Score 03/03/21 0203 10     Pain Loc --      Pain Edu? --      Excl. in GC? --     Most recent vital signs: Vitals:   03/03/21 0145  BP: (!) 147/100  Pulse: 64  Resp: 16  Temp: 98.5 F (36.9 C)  SpO2: 98%     General: Awake, appears uncomfortable but in no distress.  ENT:  Broken tooth #16.  No evidence of gum swelling, redness or abscess that I can see.  No facial or tongue swelling noted. CV:  RRR, no murmur. Resp:  Normal effort.  CTA bilaterally.    ED Results / Procedures / Treatments     MEDICATIONS ORDERED IN ED: Medications   ketorolac (TORADOL) 30 MG/ML injection 30 mg (30 mg Intramuscular Given 03/03/21 0733)  clindamycin (CLEOCIN) capsule 300 mg (300 mg Oral Given 03/03/21 0731)     IMPRESSION / MDM / ASSESSMENT AND PLAN / ED COURSE  I reviewed the triage vital signs and the nursing notes.  Dental Pain:  Differential diagnosis includes, but is not limited to fractured tooth, fractured tooth with abscess, exposed nerve root of the mouth  Exam consistent with fractured tooth without evidence of abscess Toradol 30 mg IM today Clindamycin 300 mg p.o. x1, as he reports he will be unable to get his prescribed medications until tomorrow Advised him to throw away the Amoxicillin and we will add this to his allergy list secondary to angioedema Rx for Clindamycin 300 mg 3 times daily x7 days, I do not think he needs a longer course than this as there is no evidence of abscess at this time Rx for 2% Viscous Lidocaine, swish and spit 15 mL 4 times  daily Rx for Ibuprofen 800 mg every 8 hours as needed Advised him to follow-up with his dentist tomorrow for further evaluation and treatment   FINAL CLINICAL IMPRESSION(S) / ED DIAGNOSES   Final diagnoses:  Open fracture of tooth with delayed healing, subsequent encounter  Angioedema, initial encounter     Rx / DC Orders   ED Discharge Orders          Ordered    clindamycin (CLEOCIN) 300 MG capsule  3 times daily        03/03/21 0738    lidocaine (XYLOCAINE) 2 % solution  Every 4 hours PRN        03/03/21 0738    ibuprofen (ADVIL) 800 MG tablet  Every 8 hours PRN        03/03/21 0569             Note:  This document was prepared using Dragon voice recognition software and may include unintentional dictation errors.    Lorre Munroe, NP 03/03/21 Conley Rolls    Shaune Pollack, MD 03/03/21 1105

## 2021-03-03 NOTE — ED Notes (Signed)
Pt cussing and disturbing other pt's in the lobby. Pt informed that he cannot cuss in lobby and security over to speak with pt.

## 2021-03-03 NOTE — Discharge Instructions (Addendum)
You were seen today for dental pain.  You have a broken tooth.  I did not see any evidence of abscess at this time.  We are changing your antibiotic to clindamycin since amoxicillin caused swelling of your tongue.  Please take this 3 times daily for the next 7 days.  I am prescribing viscous lidocaine for you to swish and spit 4 times a day for the next 5 days.  I am also prescribing ibuprofen for you to take every 8 hours as needed for food.  This is for pain and inflammation.  Please call your dentist tomorrow and follow-up for further evaluation and treatment.

## 2021-03-03 NOTE — ED Notes (Signed)
Observed pt eating funyuns and drinking mtn dew

## 2021-03-06 DIAGNOSIS — R69 Illness, unspecified: Secondary | ICD-10-CM | POA: Diagnosis not present

## 2021-03-09 ENCOUNTER — Emergency Department
Admission: EM | Admit: 2021-03-09 | Discharge: 2021-03-10 | Disposition: A | Discharge status: Home / Self Care | Payer: Medicare HMO | Attending: Emergency Medicine | Admitting: Emergency Medicine

## 2021-03-09 ENCOUNTER — Other Ambulatory Visit: Payer: Self-pay

## 2021-03-09 DIAGNOSIS — F250 Schizoaffective disorder, bipolar type: Secondary | ICD-10-CM | POA: Diagnosis not present

## 2021-03-09 DIAGNOSIS — R440 Auditory hallucinations: Secondary | ICD-10-CM | POA: Diagnosis present

## 2021-03-09 DIAGNOSIS — R4689 Other symptoms and signs involving appearance and behavior: Secondary | ICD-10-CM | POA: Diagnosis not present

## 2021-03-09 DIAGNOSIS — F17200 Nicotine dependence, unspecified, uncomplicated: Secondary | ICD-10-CM | POA: Diagnosis not present

## 2021-03-09 DIAGNOSIS — Z20822 Contact with and (suspected) exposure to covid-19: Secondary | ICD-10-CM | POA: Insufficient documentation

## 2021-03-09 DIAGNOSIS — F32A Depression, unspecified: Secondary | ICD-10-CM | POA: Diagnosis not present

## 2021-03-09 DIAGNOSIS — F209 Schizophrenia, unspecified: Secondary | ICD-10-CM | POA: Diagnosis not present

## 2021-03-09 DIAGNOSIS — R45851 Suicidal ideations: Secondary | ICD-10-CM | POA: Diagnosis not present

## 2021-03-09 DIAGNOSIS — F989 Unspecified behavioral and emotional disorders with onset usually occurring in childhood and adolescence: Secondary | ICD-10-CM | POA: Diagnosis not present

## 2021-03-09 DIAGNOSIS — R457 State of emotional shock and stress, unspecified: Secondary | ICD-10-CM | POA: Diagnosis not present

## 2021-03-09 LAB — COMPREHENSIVE METABOLIC PANEL
ALT: 10 U/L (ref 0–44)
AST: 18 U/L (ref 15–41)
Albumin: 4.3 g/dL (ref 3.5–5.0)
Alkaline Phosphatase: 50 U/L (ref 38–126)
Anion gap: 10 (ref 5–15)
BUN: 17 mg/dL (ref 6–20)
CO2: 26 mmol/L (ref 22–32)
Calcium: 9.3 mg/dL (ref 8.9–10.3)
Chloride: 99 mmol/L (ref 98–111)
Creatinine, Ser: 0.75 mg/dL (ref 0.61–1.24)
GFR, Estimated: >60 mL/min (ref >60)
Glucose, Bld: 67 mg/dL — ABNORMAL LOW (ref 70–99)
Potassium: 3.7 mmol/L (ref 3.5–5.1)
Sodium: 135 mmol/L (ref 135–145)
Total Bilirubin: 0.7 mg/dL (ref 0.3–1.2)
Total Protein: 7.9 g/dL (ref 6.5–8.1)

## 2021-03-09 LAB — CBC
HCT: 43.3 % (ref 39.0–52.0)
Hemoglobin: 14.4 g/dL (ref 13.0–17.0)
MCH: 28.9 pg (ref 26.0–34.0)
MCHC: 33.3 g/dL (ref 30.0–36.0)
MCV: 86.8 fL (ref 80.0–100.0)
Platelets: 344 10*3/uL (ref 150–400)
RBC: 4.99 MIL/uL (ref 4.22–5.81)
RDW: 13.3 % (ref 11.5–15.5)
WBC: 8.8 10*3/uL (ref 4.0–10.5)
nRBC: 0 % (ref 0.0–0.2)

## 2021-03-09 LAB — SALICYLATE LEVEL: Salicylate Lvl: <7 mg/dL — ABNORMAL LOW (ref 7.0–30.0)

## 2021-03-09 LAB — ETHANOL: Alcohol, Ethyl (B): <10 mg/dL (ref <10)

## 2021-03-09 LAB — ACETAMINOPHEN LEVEL: Acetaminophen (Tylenol), Serum: <10 ug/mL — ABNORMAL LOW (ref 10–30)

## 2021-03-09 NOTE — ED Triage Notes (Signed)
Pt states he is here to be seen by the psychiatry team- when pt was being wheeled in to the lobby he was talking about killing himself and how he does not like his grandpa- pt also complaining of a tooth that hurts that he has been seen here for previously- when asked about SI he denies ?

## 2021-03-09 NOTE — ED Triage Notes (Signed)
Pt comes ems from home with follow up on tooth infection as well as SI. States that he has had thoughts of killing himself today as well as hallucinations. States that he does not want to kill himself while he's here.  ?

## 2021-03-09 NOTE — ED Notes (Signed)
VOL  

## 2021-03-09 NOTE — ED Notes (Signed)
TTS in speaking with patient. 

## 2021-03-09 NOTE — ED Notes (Addendum)
Pt dressed into hospital scrubs, belongings to include ? ?1 pair white and red socks ?2 pair white socks ?1 pair black joggers ?1 multicolor tshirt ?1 black and red zip jacket ?1 black bracelet ?1 black lanyard with writing, keys attached ?1 cellphone ?1 lighter ?1 pair grey underwear with characters ? ?Pt has bag of medication that was sent to pharmacy ?

## 2021-03-09 NOTE — ED Provider Notes (Signed)
? ?  [facility] ?Provider Note ? ? ? Event Date/Time  ? First MD Initiated Contact with Patient 03/09/21 1755   ?  (approximate) ? ? ?History  ? ?Psychiatric Evaluation ? ? ?HPI ? ?Kenneth Jensen is a 31 y.o. male  who, per outpatient note dated 11/26/2020 has history of schizophrenia, who presents to the emergency department today  for unclear reasons. Unfortunately the patient is a poor historian. He states he feels unwell. The patient however cannot verbalize what he means by this. He states he would like to lay down and have some medication, but he was not able to answer when asked what he wanted the medication to help with.  ? ? ?Physical Exam  ? ?Triage Vital Signs: ?ED Triage Vitals  ?Enc Vitals Group  ?   BP 03/09/21 1729 122/78  ?   Pulse Rate 03/09/21 1729 91  ?   Resp 03/09/21 1729 18  ?   Temp 03/09/21 1736 98.5 ?F (36.9 ?C)  ?   Temp Source 03/09/21 1736 Oral  ?   SpO2 03/09/21 1729 100 %  ?   Weight 03/09/21 1731 201 lb (91.2 kg)  ?   Height 03/09/21 1731 5\' 5"  (1.651 m)  ?   Head Circumference --   ?   Peak Flow --   ?   Pain Score 03/09/21 1730 0  ?   Pain Loc --   ?   Pain Edu? --   ?   Excl. in [place]? --   ? ? ?Most recent vital signs: ?Vitals:  ? 03/09/21 1729 03/09/21 1736  ?BP: 122/78   ?Pulse: 91   ?Resp: 18   ?Temp:  98.5 ?F (36.9 ?C)  ?SpO2: 100%   ? ? ?General: Awake. Not oriented.  ?CV:  Good peripheral perfusion. Regular rate and rhythm. ?Resp:  Normal effort. Lungs clear ?Abd:  No distention.  ? ?ED Results / Procedures / Treatments  ? ?Labs ?(all labs ordered are listed, but only abnormal results are displayed) ?Labs Reviewed  ?CBC  ?COMPREHENSIVE METABOLIC PANEL  ?ETHANOL  ?SALICYLATE LEVEL  ?ACETAMINOPHEN LEVEL  ?URINE DRUG SCREEN, QUALITATIVE ([facility] ONLY)  ? ? ? ?EKG ? ?None ? ? ?RADIOLOGY ?None ? ?PROCEDURES: ? ?Critical Care performed: No ? ?Procedures ? ? ?MEDICATIONS ORDERED IN ED: ?Medications - No data to display ? ? ?IMPRESSION / MDM / ASSESSMENT AND PLAN  / ED COURSE  ?I reviewed the triage vital signs and the nursing notes. ?             ?               ? ?Differential diagnosis includes, but is not limited to, psychiatric illness. ? ?Patient presented to the emergency department for somewhat unclear etiology.  Patient is either incapable or unwilling to give a good history as to why he is here.  He does have history of psychiatric illness and I do wonder if that is playing a role.  We will plan on having psychiatry evaluate. ? ?FINAL CLINICAL IMPRESSION(S) / ED DIAGNOSES  ? ?Final diagnoses:  ?Abnormal behavior  ? ? ? ?Rx / DC Orders  ? ?ED Discharge Orders   ? ? None  ? ?  ? ? ? ?Note:  This document was prepared using Dragon voice recognition software and may include unintentional dictation errors. ? ?  ?[name], MD ?03/09/21 2311 ? ?
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
## 2021-03-10 DIAGNOSIS — R440 Auditory hallucinations: Secondary | ICD-10-CM

## 2021-03-10 DIAGNOSIS — R4689 Other symptoms and signs involving appearance and behavior: Secondary | ICD-10-CM | POA: Insufficient documentation

## 2021-03-10 DIAGNOSIS — F250 Schizoaffective disorder, bipolar type: Secondary | ICD-10-CM

## 2021-03-10 LAB — URINE DRUG SCREEN, QUALITATIVE (ARMC ONLY)
Amphetamines, Ur Screen: POSITIVE — AB
Barbiturates, Ur Screen: NOT DETECTED
Benzodiazepine, Ur Scrn: NOT DETECTED
Cannabinoid 50 Ng, Ur ~~LOC~~: POSITIVE — AB
Cocaine Metabolite,Ur ~~LOC~~: NOT DETECTED
MDMA (Ecstasy)Ur Screen: NOT DETECTED
Methadone Scn, Ur: NOT DETECTED
Opiate, Ur Screen: NOT DETECTED
Phencyclidine (PCP) Ur S: NOT DETECTED
Tricyclic, Ur Screen: NOT DETECTED

## 2021-03-10 LAB — RESP PANEL BY RT-PCR (FLU A&B, COVID) ARPGX2
Influenza A by PCR: NEGATIVE
Influenza B by PCR: NEGATIVE
SARS Coronavirus 2 by RT PCR: NEGATIVE

## 2021-03-10 MED ORDER — TRAZODONE HCL 50 MG PO TABS: 50.0000 mg | ORAL_TABLET | Freq: Every evening | ORAL | Status: DC | PRN

## 2021-03-10 MED ORDER — DIVALPROEX SODIUM ER 250 MG PO TB24
500.0000 mg | ORAL_TABLET | Freq: Three times a day (TID) | ORAL | Status: DC
  Administered 2021-03-10: 500 mg via ORAL
  Filled 2021-03-10: qty 2

## 2021-03-10 MED ORDER — QUETIAPINE FUMARATE 200 MG PO TABS
200.0000 mg | ORAL_TABLET | Freq: Every day | ORAL | Status: DC
Start: 2021-03-10 — End: 2021-03-10

## 2021-03-10 MED ORDER — CLINDAMYCIN HCL 150 MG PO CAPS
300.0000 mg | ORAL_CAPSULE | Freq: Three times a day (TID) | ORAL | Status: DC
  Administered 2021-03-10: 300 mg via ORAL
  Filled 2021-03-10: qty 2

## 2021-03-10 NOTE — Discharge Instructions (Signed)
Please seek medical attention and help for any thoughts about wanting to harm yourself, harm others, any concerning change in behavior, severe depression, inappropriate drug use or any other new or concerning symptoms. ° °

## 2021-03-10 NOTE — BH Assessment (Signed)
Patient has been accepted to [facility].  ?Patient assigned to [facility] ?Accepting physician is Dr. He.  ?Call report to [phone] or [phone].  ?Representative was [employer].  ? ?ER Staff is aware of it:  ?Glenda, ER Secretary  ?Crystal, Patient's Nurse ? ? ?Bed is available tomorrow (03/11/2021) after 10am. ? ?Address: ?[address],  ?[place], [place] [address] ? ? ?

## 2021-03-10 NOTE — ED Notes (Signed)
Pt given lunch tray.

## 2021-03-10 NOTE — ED Notes (Signed)
Breakfast tray delivered to pt.

## 2021-03-10 NOTE — ED Provider Notes (Signed)
Emergency Medicine Observation Re-evaluation Note ? ?Kenneth Jensen is a 31 y.o. male, seen on rounds today.  Pt initially presented to the ED for complaints of Psychiatric Evaluation ?Currently, the patient is resting, voices no medical complaints. ? ?Physical Exam  ?BP 109/72 (BP Location: Right Arm)   Pulse 76   Temp 98.1 ?F (36.7 ?C) (Oral)   Resp 16   Ht 5\' 5"  (1.651 m)   Wt 91.2 kg   SpO2 99%   BMI 33.45 kg/m?  ?Physical Exam ?General: Resting in no acute distress ?Cardiac: No cyanosis ?Lungs: Equal rise and fall ?Psych: Not agitated ? ?ED Course / MDM  ?EKG:  ? ?I have reviewed the labs performed to date as well as medications administered while in observation.  Recent changes in the last 24 hours include no events overnight. ? ?Plan  ?Current plan is for psychiatric disposition. ? [name] is not under involuntary commitment. ? ? ?  ?Reyne Dumas, MD ?03/10/21 [phone] ? ?

## 2021-03-10 NOTE — ED Notes (Signed)
Pt given dinner tray.

## 2021-03-10 NOTE — ED Notes (Signed)
Pt provided shower supplies. Pt in shower ?

## 2021-03-10 NOTE — ED Notes (Signed)
Pt asked to showed. This RN advised to shower I needed a urine specimen first. Pt provided one.  ?

## 2021-03-10 NOTE — ED Notes (Signed)
VOL/pending psych inpatient admission when medically cleared. ?

## 2021-03-10 NOTE — ED Notes (Signed)
Belongings returned to patient. Pt refused vitals and signature. Pt left on his own accord. Discharged by MD.  ?

## 2021-03-10 NOTE — Consult Note (Cosign Needed)
[facility] Face-to-Face Psychiatry Consult   Reason for Consult: Psych evaluation Referring Physician:  Dr. Derrill Kay Patient Identification: Kenneth Jensen MRN:  [id number] Principal Diagnosis: Schizoaffective disorder, bipolar type (HCC) Diagnosis:  Principal Problem:   Schizoaffective disorder, bipolar type (HCC) Active Problems:   Auditory hallucinations   Total Time spent with patient: 45 minutes  Subjective:   "Im hearing voices and my body is not feeling good"  HPI:  Kenneth Jensen, 31 y.o., male patient seen  by TTS and this provider; chart reviewed and consulted with Dr. Derrill Kay on 03/10/21.  On evaluation Kenneth Jensen patient is laying in bed with back towards this provider.  Patient states that he is hearing voices and they tell him to do things. Patient would not elaborate beyond that. Patient did not maintain eye contact nor did he engage in any meaningful communication.   Per TTS, pt with a hx of schizoaffective disorder, bipolar type. Pt also has a hx of cannabis use d/o, moderate use. Pt presented to [facility] ED voluntarily via EMS with complaints of SI and hallucinations. Pt was in the hallway talking loudly upon this writer's arrival. Initially, pt. presented with pressured and tangential speech. Pt had a disheveled appearance. Motor behavior was agitated and restless. Pt had poor concentration and was highly distractible. Pt's mood was agitated and his affect was congruent. Pt was cooperative, but largely disorganized. Pt had perseverations about the ways in which his family mistreats him and having no support. Pt was grandiose and religiously preoccupied. Pt reported that he takes his psych meds, however he may skip a day as he does not want to overdose. Pt reported that he has appetite and sleep disturbance. Pt explained that he struggles with sleep due to satin riding his back. Pt had no insight and impaired judgement. The patient endorsed current thoughts of SI; but  was able to contract for safety. Pt denied HI or AV/H. Of note, pt was responding to internal/external stimuli throughout the assessment. As the assessment progressed the pt began presenting with thought blocking and paucity of speech.  Past Psychiatric History: Schizoaffective disorder  Risk to Self:   Risk to Others:   Prior Inpatient Therapy:   Prior Outpatient Therapy:    Past Medical History:  Past Medical History:  Diagnosis Date   Asthma    Bipolar 1 disorder (HCC)    Depression    Schizophrenia (HCC)    Schizotaxia    History reviewed. No pertinent surgical history. Family History: No family history on file. Family Psychiatric  History: unknwn Social History:  Social History   Substance and Sexual Activity  Alcohol Use Yes   Comment: social     Social History   Substance and Sexual Activity  Drug Use Yes   Types: Marijuana    Social History   Socioeconomic History   Marital status: Single    Spouse name: Not on file   Number of children: Not on file   Years of education: Not on file   Highest education level: Not on file  Occupational History   Not on file  Tobacco Use   Smoking status: Every Day   Smokeless tobacco: Never  Substance and Sexual Activity   Alcohol use: Yes    Comment: social   Drug use: Yes    Types: Marijuana   Sexual activity: Not on file  Other Topics Concern   Not on file  Social History Narrative   ** Merged History Encounter **  Social Determinants of Health   Financial Resource Strain: Not on file  Food Insecurity: Not on file  Transportation Needs: Not on file  Physical Activity: Not on file  Stress: Not on file  Social Connections: Not on file   Additional Social History:    Allergies:   Allergies  Allergen Reactions   Amoxicillin Swelling   Haldol [Haloperidol]     Pt states "I went crazy"   Percocet [Oxycodone-Acetaminophen]    Vicodin [Hydrocodone-Acetaminophen] Itching   Vicodin  [Hydrocodone-Acetaminophen]     Labs:  Results for orders placed or performed during the hospital encounter of 03/09/21 (from the past 48 hour(s))  Comprehensive metabolic panel     Status: Abnormal   Collection Time: 03/09/21  5:36 PM  Result Value Ref Range   Sodium 135 135 - 145 mmol/L   Potassium 3.7 3.5 - 5.1 mmol/L   Chloride 99 98 - 111 mmol/L   CO2 26 22 - 32 mmol/L   Glucose, Bld 67 (L) 70 - 99 mg/dL    Comment: Glucose reference range applies only to samples taken after fasting for at least 8 hours.   BUN 17 6 - 20 mg/dL   Creatinine, Ser 2.45 0.61 - 1.24 mg/dL   Calcium 9.3 8.9 - 80.9 mg/dL   Total Protein 7.9 6.5 - 8.1 g/dL   Albumin 4.3 3.5 - 5.0 g/dL   AST 18 15 - 41 U/L   ALT 10 0 - 44 U/L   Alkaline Phosphatase 50 38 - 126 U/L   Total Bilirubin 0.7 0.3 - 1.2 mg/dL   GFR, Estimated >98 >33 mL/min    Comment: (NOTE) Calculated using the CKD-EPI Creatinine Equation (2021)    Anion gap 10 5 - 15    Comment: Performed at [facility], [address], [place], [place] [address]  Ethanol     Status: None   Collection Time: 03/09/21  5:36 PM  Result Value Ref Range   Alcohol, Ethyl (B) <10 <10 mg/dL    Comment: (NOTE) Lowest detectable limit for serum alcohol is 10 mg/dL.  For medical purposes only. Performed at [facility], [address] [place], [place] [address]   Salicylate level     Status: Abnormal   Collection Time: 03/09/21  5:36 PM  Result Value Ref Range   Salicylate Lvl <7.0 (L) 7.0 - 30.0 mg/dL    Comment: Performed at [facility], [address] [place], [place] [address]  Acetaminophen level     Status: Abnormal   Collection Time: 03/09/21  5:36 PM  Result Value Ref Range   Acetaminophen (Tylenol), Serum <10 (L) 10 - 30 ug/mL    Comment: (NOTE) Therapeutic concentrations vary significantly. A range of 10-30 ug/mL  may be an effective concentration for many patients. However, some  are best treated at  concentrations outside of this range. Acetaminophen concentrations >150 ug/mL at 4 hours after ingestion  and >50 ug/mL at 12 hours after ingestion are often associated with  toxic reactions.  Performed at [facility], [address] [place], [place] [address]   cbc     Status: None   Collection Time: 03/09/21  5:36 PM  Result Value Ref Range   WBC 8.8 4.0 - 10.5 K/uL   RBC 4.99 4.22 - 5.81 MIL/uL   Hemoglobin 14.4 13.0 - 17.0 g/dL   HCT 09.7 35.3 - 29.9 %   MCV 86.8 80.0 - 100.0 fL   MCH 28.9 26.0 - 34.0  pg   MCHC 33.3 30.0 - 36.0 g/dL   RDW 09.7 35.3 - 29.9 %   Platelets 344 150 - 400 K/uL   nRBC 0.0 0.0 - 0.2 %    Comment: Performed at [facility], [address] [place], [place] [address]    No current facility-administered medications for this encounter.   Current Outpatient Medications  Medication Sig Dispense Refill   clindamycin (CLEOCIN) 300 MG capsule Take 1 capsule (300 mg total) by mouth 3 (three) times daily for 7 days. 21 capsule 0   divalproex (DEPAKOTE ER) 500 MG 24 hr tablet Take 1 tablet (500 mg total) by mouth 3 (three) times daily. 90 tablet 1   ibuprofen (ADVIL) 800 MG tablet Take 1 tablet (800 mg total) by mouth every 8 (eight) hours as needed. 15 tablet 0   paliperidone (INVEGA SUSTENNA) 234 MG/1.5ML SUSY injection Inject 234 mg into the muscle every 30 (thirty) days.     QUEtiapine (SEROQUEL) 200 MG tablet Take 1 tablet (200 mg total) by mouth at bedtime. 30 tablet 1   traZODone (DESYREL) 50 MG tablet Take 1 tablet (50 mg total) by mouth at bedtime as needed for sleep. 30 tablet 1    Musculoskeletal: Strength & Muscle Tone: within normal limits Gait & Station: normal Patient leans: N/A  Psychiatric Specialty Exam:  Presentation  General Appearance: Bizarre  Eye Contact:Minimal  Speech:Blocked  Speech Volume:Decreased  Handedness:Right   Mood and Affect  Mood:Dysphoric  Affect:Flat; Restricted   Thought  Process  Thought Processes:Disorganized  Descriptions of Associations:Loose  Orientation:Partial  Thought Content:Illogical  History of Schizophrenia/Schizoaffective disorder:Yes  Duration of Psychotic Symptoms:Greater than six months  Hallucinations:Hallucinations: Auditory; Command  Ideas of Reference:Paranoia  Suicidal Thoughts:Suicidal Thoughts: No  Homicidal Thoughts:Homicidal Thoughts: No   Sensorium  Memory:Recent Poor; Remote Poor  Judgment:Impaired  Insight:Poor   Executive Functions  Concentration:Poor  Attention Span:Poor  Recall:Poor  Fund of Knowledge:Poor  Language:Poor   Psychomotor Activity  Psychomotor Activity:Psychomotor Activity: Normal   Assets  Assets:Communication Skills Sleep  Sleep:Sleep: Poor   Physical Exam: Physical Exam Vitals and nursing note reviewed.  Constitutional:      General: He is in acute distress.  HENT:     Head: Normocephalic and atraumatic.     Nose: Nose normal.  Pulmonary:     Effort: Pulmonary effort is normal.  Musculoskeletal:        General: Normal range of motion.     Cervical back: Normal range of motion.  Skin:    General: Skin is warm and dry.  Neurological:     Mental Status: He is disoriented.  Psychiatric:        Attention and Perception: He is inattentive.        Mood and Affect: Affect is labile, blunt and inappropriate.        Speech: He is noncommunicative.        Behavior: Behavior is slowed and withdrawn.        Thought Content: Thought content is paranoid and delusional.        Cognition and Memory: Cognition is impaired. Memory is impaired.        Judgment: Judgment is impulsive and inappropriate.   Review of Systems  Psychiatric/Behavioral:  Positive for hallucinations. The patient has insomnia.   All other systems reviewed and are negative. Blood pressure 109/72, pulse 76, temperature 98.1 F (36.7 C), temperature source Oral, resp. rate 16, height 5\' 5"  (1.651 m),  weight 91.2 kg, SpO2 99 %.  Body mass index is 33.45 kg/m.  Treatment Plan Summary: Daily contact with patient to assess and evaluate symptoms and progress in treatment and Medication management  Disposition: Recommend psychiatric Inpatient admission when medically cleared. Supportive therapy provided about ongoing stressors. Discussed crisis plan, support from social network, calling 911, coming to the Emergency Department, and calling Suicide Hotline.  Jearld Leschashaun M Shaquela Weichert, NP 03/10/2021 3:51 AM

## 2021-03-10 NOTE — ED Notes (Signed)
Pt is asleep at this time, will receive VS when awake ?

## 2021-03-10 NOTE — ED Notes (Signed)
Pt back in room. Given crackers and apple juice. ?

## 2021-03-10 NOTE — ED Notes (Signed)
Pt went to the bathroom and refused to provide specimen.  ?

## 2021-03-10 NOTE — ED Notes (Signed)
Pt is awake and asking to leave. He is voluntary. Will MSG Calvin and see if he will see the patient/  ?

## 2021-03-10 NOTE — ED Notes (Signed)
Pt bag of meds returned to patient from pharmacy.  ?

## 2021-03-10 NOTE — ED Notes (Signed)
Pt is requesting to leave again. MD made aware. Pt is voluntary.  ?

## 2021-03-10 NOTE — BH Assessment (Signed)
Writer called [employer] [phone]) and inform them the patient was discharge from the ER and no longer need the bed. ?

## 2021-03-10 NOTE — BH Assessment (Addendum)
Referral information for Psychiatric Hospitalization faxed to: ?[facility]-Unable to accept pt. at this time due to staffing concerns. ?  ?[facility] [phone]),  ? ?[facility] [phone]), ? ?[facility] [phone]),  ? ?[facility] [phone] -or- [phone]),  ? ?[facility] [phone]) ? ?[facility] [phone]). ? ?[facility] [phone]) ? ?[facility] [phone])   ?

## 2021-03-10 NOTE — ED Notes (Signed)
Kenneth Jensen messaged back and advised it was up to EDP. EDP not available at this time to see patient.  ?

## 2021-03-10 NOTE — ED Provider Notes (Signed)
Patient states that he would like to leave at this time. States he needs to get his apartment ready for an inspection. States he feels a lot better. Denies any SI or HI at this time.  ?  ?[name], MD ?03/10/21 1608 ? ?

## 2021-03-10 NOTE — ED Notes (Signed)
Portia called from [facility] in [place] for tomorrow.  ?

## 2021-03-14 ENCOUNTER — Other Ambulatory Visit: Payer: Self-pay

## 2021-03-14 ENCOUNTER — Emergency Department
Admission: EM | Admit: 2021-03-14 | Discharge: 2021-03-15 | Disposition: A | Discharge status: Home / Self Care | Payer: Medicare HMO | Attending: Emergency Medicine | Admitting: Emergency Medicine

## 2021-03-14 DIAGNOSIS — F250 Schizoaffective disorder, bipolar type: Secondary | ICD-10-CM | POA: Diagnosis not present

## 2021-03-14 DIAGNOSIS — R4689 Other symptoms and signs involving appearance and behavior: Secondary | ICD-10-CM | POA: Diagnosis present

## 2021-03-14 DIAGNOSIS — F259 Schizoaffective disorder, unspecified: Secondary | ICD-10-CM

## 2021-03-14 DIAGNOSIS — F17200 Nicotine dependence, unspecified, uncomplicated: Secondary | ICD-10-CM | POA: Insufficient documentation

## 2021-03-14 DIAGNOSIS — R443 Hallucinations, unspecified: Secondary | ICD-10-CM | POA: Diagnosis not present

## 2021-03-14 DIAGNOSIS — Z20822 Contact with and (suspected) exposure to covid-19: Secondary | ICD-10-CM | POA: Diagnosis not present

## 2021-03-14 DIAGNOSIS — R7303 Prediabetes: Secondary | ICD-10-CM | POA: Diagnosis present

## 2021-03-14 DIAGNOSIS — F1220 Cannabis dependence, uncomplicated: Secondary | ICD-10-CM | POA: Insufficient documentation

## 2021-03-14 DIAGNOSIS — R440 Auditory hallucinations: Secondary | ICD-10-CM | POA: Diagnosis present

## 2021-03-14 DIAGNOSIS — F29 Unspecified psychosis not due to a substance or known physiological condition: Secondary | ICD-10-CM | POA: Diagnosis not present

## 2021-03-14 DIAGNOSIS — F10929 Alcohol use, unspecified with intoxication, unspecified: Secondary | ICD-10-CM | POA: Diagnosis not present

## 2021-03-14 LAB — URINE DRUG SCREEN, QUALITATIVE (ARMC ONLY)
Amphetamines, Ur Screen: NOT DETECTED
Barbiturates, Ur Screen: NOT DETECTED
Benzodiazepine, Ur Scrn: NOT DETECTED
Cannabinoid 50 Ng, Ur ~~LOC~~: POSITIVE — AB
Cocaine Metabolite,Ur ~~LOC~~: NOT DETECTED
MDMA (Ecstasy)Ur Screen: NOT DETECTED
Methadone Scn, Ur: NOT DETECTED
Opiate, Ur Screen: NOT DETECTED
Phencyclidine (PCP) Ur S: NOT DETECTED
Tricyclic, Ur Screen: NOT DETECTED

## 2021-03-14 LAB — CBC
HCT: 40 % (ref 39.0–52.0)
Hemoglobin: 13 g/dL (ref 13.0–17.0)
MCH: 28.8 pg (ref 26.0–34.0)
MCHC: 32.5 g/dL (ref 30.0–36.0)
MCV: 88.5 fL (ref 80.0–100.0)
Platelets: 285 10*3/uL (ref 150–400)
RBC: 4.52 MIL/uL (ref 4.22–5.81)
RDW: 13.5 % (ref 11.5–15.5)
WBC: 8.4 10*3/uL (ref 4.0–10.5)
nRBC: 0 % (ref 0.0–0.2)

## 2021-03-14 LAB — COMPREHENSIVE METABOLIC PANEL
ALT: 10 U/L (ref 0–44)
AST: 16 U/L (ref 15–41)
Albumin: 3.5 g/dL (ref 3.5–5.0)
Alkaline Phosphatase: 46 U/L (ref 38–126)
Anion gap: 8 (ref 5–15)
BUN: 12 mg/dL (ref 6–20)
CO2: 29 mmol/L (ref 22–32)
Calcium: 8.4 mg/dL — ABNORMAL LOW (ref 8.9–10.3)
Chloride: 103 mmol/L (ref 98–111)
Creatinine, Ser: 0.78 mg/dL (ref 0.61–1.24)
GFR, Estimated: >60 mL/min (ref >60)
Glucose, Bld: 126 mg/dL — ABNORMAL HIGH (ref 70–99)
Potassium: 4 mmol/L (ref 3.5–5.1)
Sodium: 140 mmol/L (ref 135–145)
Total Bilirubin: 0.2 mg/dL — ABNORMAL LOW (ref 0.3–1.2)
Total Protein: 6.6 g/dL (ref 6.5–8.1)

## 2021-03-14 LAB — RESP PANEL BY RT-PCR (FLU A&B, COVID) ARPGX2
Influenza A by PCR: NEGATIVE
Influenza B by PCR: NEGATIVE
SARS Coronavirus 2 by RT PCR: NEGATIVE

## 2021-03-14 LAB — SALICYLATE LEVEL: Salicylate Lvl: <7 mg/dL — ABNORMAL LOW (ref 7.0–30.0)

## 2021-03-14 LAB — ETHANOL: Alcohol, Ethyl (B): <10 mg/dL (ref <10)

## 2021-03-14 LAB — ACETAMINOPHEN LEVEL: Acetaminophen (Tylenol), Serum: <10 ug/mL — ABNORMAL LOW (ref 10–30)

## 2021-03-14 NOTE — ED Notes (Signed)
Pt. To BHU from ED ambulatory without difficulty, to room  BHU 3. Report from [facility]. Pt. Is alert and oriented, warm and dry in no distress. Pt. Denies SI, HI, and AH. Pt does state he is seeing good spirits. Pt. Calm and cooperative. Pt. Made aware of security cameras and Q15 minute rounds. Pt. Encouraged to let Nursing staff know of any concerns or needs.  ? ? ?ENVIRONMENTAL ASSESSMENT ?Potentially harmful objects out of patient reach: Yes.   ?Personal belongings secured: Yes.   ?Patient dressed in hospital provided attire only: Yes.   ?Plastic bags out of patient reach: Yes.   ?Patient care equipment (cords, cables, call bells, lines, and drains) shortened, removed, or accounted for: Yes.   ?Equipment and supplies removed from bottom of stretcher: Yes.   ?Potentially toxic materials out of patient reach: Yes.   ?Sharps container removed or out of patient reach: Yes.   ? ?

## 2021-03-14 NOTE — ED Notes (Signed)
Pt refusing to answer suicide screening questions.   ?

## 2021-03-14 NOTE — ED Triage Notes (Addendum)
Pt presents to ER via ems c/o auditory hallucinations.  Pt states he is hearing voices saying he is going to be "lonely again."  Pt states he has hx of  schizophrenia and "hasn't got any help."  Pt in triage room requesting bed to lay down on because he is tired and requesting sandwich.  Pt not elaborating further on circumstances. When asked if pt has any SI or HI, he states "I might."  Pt in NAD in triage. ?

## 2021-03-14 NOTE — ED Notes (Addendum)
Pt belongings: ? ?One pair black shoes  ?One pair gray socks  ?black and blue t shirt  ?One pair black pants  ?One pair gray underwear  ?One black, yellow and gray jacket  ?One set of keys on lanyard  ?One black wallet  ?One blue and black flip phone  ? ?Pt placed in hospital [facility] scrubs  ?
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
## 2021-03-14 NOTE — ED Provider Notes (Signed)
? ?[facility] ?Provider Note ? ? ? Event Date/Time  ? First MD Initiated Contact with Patient 03/14/21 2106   ?  (approximate) ? ? ?History  ? ?Feel bad ? ? ?HPI ? ?Kenneth Jensen is a 31 y.o. male  who, per psychiatry note dated 03/10/21 has history of schizoaffective disorder, bipolar type, who presents to the emergency department because he states he is feeling bad today.  He states he is upset because he is hearing things.  He denies any fevers or pain.  History is somewhat limited. ? ? ?Physical Exam  ? ?Triage Vital Signs: ?ED Triage Vitals [03/14/21 2029]  ?Enc Vitals Group  ?   BP 128/73  ?   Pulse Rate 79  ?   Resp 17  ?   Temp 98.5 ?F (36.9 ?C)  ?   Temp Source Oral  ?   SpO2 100 %  ?   Weight   ?   Height   ?   Head Circumference   ?   Peak Flow   ?   Pain Score 0  ? ?Most recent vital signs: ?Vitals:  ? 03/14/21 2029  ?BP: 128/73  ?Pulse: 79  ?Resp: 17  ?Temp: 98.5 ?F (36.9 ?C)  ?SpO2: 100%  ? ?General: Awake, no distress.  ?CV:  Good peripheral perfusion.  ?Resp:  Normal effort. Lungs clear. ?Abd:  No distention.  ?Psych:  Does not appear to be responding to internal stimuli. ? ? ?ED Results / Procedures / Treatments  ? ?Labs ?(all labs ordered are listed, but only abnormal results are displayed) ?Labs Reviewed  ?COMPREHENSIVE METABOLIC PANEL - Abnormal; Notable for the following components:  ?    Result Value  ? Glucose, Bld 126 (*)   ? Calcium 8.4 (*)   ? Total Bilirubin 0.2 (*)   ? All other components within normal limits  ?SALICYLATE LEVEL - Abnormal; Notable for the following components:  ? Salicylate Lvl <7.0 (*)   ? All other components within normal limits  ?ACETAMINOPHEN LEVEL - Abnormal; Notable for the following components:  ? Acetaminophen (Tylenol), Serum <10 (*)   ? All other components within normal limits  ?URINE DRUG SCREEN, QUALITATIVE ([facility] ONLY) - Abnormal; Notable for the following components:  ? Cannabinoid 50 Ng, Ur [place] POSITIVE (*)   ? All other  components within normal limits  ?RESP PANEL BY RT-PCR (FLU A&B, COVID) ARPGX2  ?ETHANOL  ?CBC  ? ? ? ?EKG ? ?None ? ? ?RADIOLOGY ?None ? ? ? ?PROCEDURES: ? ?Critical Care performed: No ? ?Procedures ? ? ?MEDICATIONS ORDERED IN ED: ?Medications - No data to display ? ? ?IMPRESSION / MDM / ASSESSMENT AND PLAN / ED COURSE  ?I reviewed the triage vital signs and the nursing notes. ?             ?               ? ?Differential diagnosis includes, but is not limited to, psychiatric illness, drug-induced hallucinations. ? ?Patient presented to the emergency department today because he states he is feeling bad and having hallucinations.  Patient does have a history of psychiatric illness.  This time patient denies suicidal ideation.  He is calm.  Will have psychiatry evaluate. ? ?The patient has been placed in psychiatric observation due to the need to provide a safe environment for the patient while obtaining psychiatric consultation and evaluation, as well as ongoing medical and medication management to treat the  patient's condition.  The patient has not been placed under full IVC at this time. ? ? ?FINAL CLINICAL IMPRESSION(S) / ED DIAGNOSES  ? ?Final diagnoses:  ?Hallucinations  ? ? ? ? ? ? ?Note:  This document was prepared using Dragon voice recognition software and may include unintentional dictation errors. ? ?  ?Phineas Semen, MD ?03/14/21 2250 ? ?

## 2021-03-14 NOTE — Consult Note (Cosign Needed)
[place] Psychiatry Consult   Reason for Consult: Feel bad  Referring Physician: Dr. Archie Balboa Patient Identification: Kenneth Jensen MRN:  [id number] Principal Diagnosis: <principal problem not specified> Diagnosis:  Active Problems:   Schizoaffective disorder, bipolar type ([place])   Auditory hallucination   Cannabis use disorder, moderate, dependence ([place])   Tobacco use disorder   Prediabetes   Abnormal behavior   Total Time spent with patient: 1 hour  Subjective: "I am going to [facility]." Kenneth Jensen is a 31 y.o. male patient presented to [facility] ED voluntarily. Per The ED triage nurse's note, Pt states he is hearing voices saying he is going to be "lonely again."  Pt states he has hx of  schizophrenia and "hasn't got any help."  Pt in triage room requesting bed to lay down on because he is tired and requesting sandwich.  Pt not elaborating further on circumstances. When asked if pt has any SI or HI, he states "I might."  Initially, the patient was seen in front of his room door, and he greeted the psych team and reported that he was "going to [facility]." When the psych team returns, the patient is found lying in his bed with his blanket covering his face laughing and talking to himself and responding to internal stimuli. The patient reports, "I'm scared." When asked why a patient feels afraid, he declares, "my jaw started hurting." The patient says he lives alone and "I feel uncomfortable there." The patient also reports that he has continued services with [facility]'s ACT team  This provider saw the patient face-to-face; the chart was reviewed, and consulted with Dr. Archie Balboa on 03/14/2021 due to the patient's care. It was discussed with the EDP that the patient does meet the criteria to be admitted to the psychiatric inpatient unit.  On evaluation, the patient is alert and oriented x4, cooperative, and mood-congruent with affect.  The patient does appear to be responding to  internal stimuli. The patient is presenting with some delusional thinking. The patient is experiencing some auditory or visual hallucinations. The patient denies any suicidal, homicidal, or self-harm ideations. The patient is presenting with psychotic or paranoid behaviors.   HPI: Per Dr. Archie Balboa, Kenneth Jensen is a 31 y.o. male  who, per psychiatry note dated 03/10/21 has history of schizoaffective disorder, bipolar type, who presents to the emergency department because he states he is feeling bad today.  He states he is upset because he is hearing things.  He denies any fevers or pain.  History is somewhat limited.  Past Psychiatric History:   Risk to Self:   Risk to Others:   Prior Inpatient Therapy:   Prior Outpatient Therapy:    Past Medical History:  Past Medical History:  Diagnosis Date   Asthma    Bipolar 1 disorder ([place])    Depression    Schizophrenia ([place])    Schizotaxia    History reviewed. No pertinent surgical history. Family History: History reviewed. No pertinent family history. Family Psychiatric  History:  Social History:  Social History   Substance and Sexual Activity  Alcohol Use Yes   Comment: social     Social History   Substance and Sexual Activity  Drug Use Yes   Types: Marijuana    Social History   Socioeconomic History   Marital status: Single    Spouse name: Not on file   Number of children: Not on file   Years of education: Not on file   Highest education  level: Not on file  Occupational History   Not on file  Tobacco Use   Smoking status: Every Day   Smokeless tobacco: Never  Substance and Sexual Activity   Alcohol use: Yes    Comment: social   Drug use: Yes    Types: Marijuana   Sexual activity: Not on file  Other Topics Concern   Not on file  Social History Narrative   ** Merged History Encounter **       Social Determinants of Health   Financial Resource Strain: Not on file  Food Insecurity: Not on file   Transportation Needs: Not on file  Physical Activity: Not on file  Stress: Not on file  Social Connections: Not on file   Additional Social History:    Allergies:   Allergies  Allergen Reactions   Amoxicillin Swelling   Haldol [Haloperidol]     Pt states "I went crazy"   Percocet [Oxycodone-Acetaminophen]    Vicodin [Hydrocodone-Acetaminophen] Itching   Vicodin [Hydrocodone-Acetaminophen]     Labs:  Results for orders placed or performed during the hospital encounter of 03/14/21 (from the past 48 hour(s))  Comprehensive metabolic panel     Status: Abnormal   Collection Time: 03/14/21  8:39 PM  Result Value Ref Range   Sodium 140 135 - 145 mmol/L   Potassium 4.0 3.5 - 5.1 mmol/L   Chloride 103 98 - 111 mmol/L   CO2 29 22 - 32 mmol/L   Glucose, Bld 126 (H) 70 - 99 mg/dL    Comment: Glucose reference range applies only to samples taken after fasting for at least 8 hours.   BUN 12 6 - 20 mg/dL   Creatinine, Ser 0.78 0.61 - 1.24 mg/dL   Calcium 8.4 (L) 8.9 - 10.3 mg/dL   Total Protein 6.6 6.5 - 8.1 g/dL   Albumin 3.5 3.5 - 5.0 g/dL   AST 16 15 - 41 U/L   ALT 10 0 - 44 U/L   Alkaline Phosphatase 46 38 - 126 U/L   Total Bilirubin 0.2 (L) 0.3 - 1.2 mg/dL   GFR, Estimated >60 >60 mL/min    Comment: (NOTE) Calculated using the CKD-EPI Creatinine Equation (2021)    Anion gap 8 5 - 15    Comment: Performed at [facility], [address] [place], [place] [address]  Ethanol     Status: None   Collection Time: 03/14/21  8:39 PM  Result Value Ref Range   Alcohol, Ethyl (B) <10 <10 mg/dL    Comment: (NOTE) Lowest detectable limit for serum alcohol is 10 mg/dL.  For medical purposes only. Performed at [facility], [address] [place], [place] [id number]   Salicylate level     Status: Abnormal   Collection Time: 03/14/21  8:39 PM  Result Value Ref Range   Salicylate Lvl [id number] (L) 7.0 - 30.0 mg/dL    Comment: Performed at [facility], [address] [place], [place] [address]  Acetaminophen level     Status: Abnormal   Collection Time: 03/14/21  8:39 PM  Result Value Ref Range   Acetaminophen (Tylenol), Serum <10 (L) 10 - 30 ug/mL    Comment: (NOTE) Therapeutic concentrations vary significantly. A range of 10-30 ug/mL  may be an effective concentration for many patients. However, some  are best treated at concentrations outside of this range. Acetaminophen concentrations >150 ug/mL at 4 hours after ingestion  and >50 ug/mL at 12 hours after ingestion are often associated  with  toxic reactions.  Performed at [facility], [address] [place], [place] [address]   cbc     Status: None   Collection Time: 03/14/21  8:39 PM  Result Value Ref Range   WBC 8.4 4.0 - 10.5 K/uL   RBC 4.52 4.22 - 5.81 MIL/uL   Hemoglobin 13.0 13.0 - 17.0 g/dL   HCT 40.0 39.0 - 52.0 %   MCV 88.5 80.0 - 100.0 fL   MCH 28.8 26.0 - 34.0 pg   MCHC 32.5 30.0 - 36.0 g/dL   RDW 13.5 11.5 - 15.5 %   Platelets 285 150 - 400 K/uL   nRBC 0.0 0.0 - 0.2 %    Comment: Performed at [facility], [address] [place], [place] [address]  Urine Drug Screen, Qualitative     Status: Abnormal   Collection Time: 03/14/21  9:27 PM  Result Value Ref Range   Tricyclic, Ur Screen NONE DETECTED NONE DETECTED   Amphetamines, Ur Screen NONE DETECTED NONE DETECTED   MDMA (Ecstasy)Ur Screen NONE DETECTED NONE DETECTED   Cocaine Metabolite,Ur [place] NONE DETECTED NONE DETECTED   Opiate, Ur Screen NONE DETECTED NONE DETECTED   Phencyclidine (PCP) Ur S NONE DETECTED NONE DETECTED   Cannabinoid 50 Ng, Ur [place] POSITIVE (A) NONE DETECTED   Barbiturates, Ur Screen NONE DETECTED NONE DETECTED   Benzodiazepine, Ur Scrn NONE DETECTED NONE DETECTED   Methadone Scn, Ur NONE DETECTED NONE DETECTED    Comment: (NOTE) Tricyclics + metabolites, urine    Cutoff 1000 ng/mL Amphetamines + metabolites, urine  Cutoff 1000 ng/mL MDMA (Ecstasy), urine               Cutoff 500 ng/mL Cocaine Metabolite, urine          Cutoff 300 ng/mL Opiate + metabolites, urine        Cutoff 300 ng/mL Phencyclidine (PCP), urine         Cutoff 25 ng/mL Cannabinoid, urine                 Cutoff 50 ng/mL Barbiturates + metabolites, urine  Cutoff 200 ng/mL Benzodiazepine, urine              Cutoff 200 ng/mL Methadone, urine                   Cutoff 300 ng/mL  The urine drug screen provides only a preliminary, unconfirmed analytical test result and should not be used for non-medical purposes. Clinical consideration and professional judgment should be applied to any positive drug screen result due to possible interfering substances. A more specific alternate chemical method must be used in order to obtain a confirmed analytical result. Gas chromatography / mass spectrometry (GC/MS) is the preferred confirm atory method. Performed at [facility], [address] [place], [place] [address]   Resp Panel by RT-PCR (Flu A&B, Covid) Nasopharyngeal Swab     Status: None   Collection Time: 03/14/21 10:01 PM   Specimen: Nasopharyngeal Swab; Nasopharyngeal(NP) swabs in vial transport medium  Result Value Ref Range   SARS Coronavirus 2 by RT PCR NEGATIVE NEGATIVE    Comment: (NOTE) SARS-CoV-2 target nucleic acids are NOT DETECTED.  The SARS-CoV-2 RNA is generally detectable in upper respiratory specimens during the acute phase of infection. The lowest concentration of SARS-CoV-2 viral copies this assay can detect is 138 copies/mL. A negative result does not preclude SARS-Cov-2 infection and should not be used as the sole basis for  treatment or other patient management decisions. A negative result may occur with  improper specimen collection/handling, submission of specimen other than nasopharyngeal swab, presence of viral mutation(s) within the areas targeted by this assay, and inadequate number of viral copies(<138 copies/mL). A negative result must be combined  with clinical observations, patient history, and epidemiological information. The expected result is Negative.  Fact Sheet for Patients:  EntrepreneurPulse.com.au  Fact Sheet for Healthcare Providers:  IncredibleEmployment.be  This test is no t yet approved or cleared by the [place] FDA and  has been authorized for detection and/or diagnosis of SARS-CoV-2 by FDA under an Emergency Use Authorization (EUA). This EUA will remain  in effect (meaning this test can be used) for the duration of the COVID-19 declaration under Section 564(b)(1) of the Act, 21 U.S.C.section 360bbb-3(b)(1), unless the authorization is terminated  or revoked sooner.       Influenza A by PCR NEGATIVE NEGATIVE   Influenza B by PCR NEGATIVE NEGATIVE    Comment: (NOTE) The Xpert Xpress SARS-CoV-2/FLU/RSV plus assay is intended as an aid in the diagnosis of influenza from Nasopharyngeal swab specimens and should not be used as a sole basis for treatment. Nasal washings and aspirates are unacceptable for Xpert Xpress SARS-CoV-2/FLU/RSV testing.  Fact Sheet for Patients: EntrepreneurPulse.com.au  Fact Sheet for Healthcare Providers: IncredibleEmployment.be  This test is not yet approved or cleared by the [place] FDA and has been authorized for detection and/or diagnosis of SARS-CoV-2 by FDA under an Emergency Use Authorization (EUA). This EUA will remain in effect (meaning this test can be used) for the duration of the COVID-19 declaration under Section 564(b)(1) of the Act, 21 U.S.C. section 360bbb-3(b)(1), unless the authorization is terminated or revoked.  Performed at [facility], [address] [place], [place] [address]     No current facility-administered medications for this encounter.   Current Outpatient Medications  Medication Sig Dispense Refill   paliperidone (INVEGA SUSTENNA) 234 MG/1.5ML SUSY  injection Inject 234 mg into the muscle every 30 (thirty) days.     divalproex (DEPAKOTE ER) 500 MG 24 hr tablet Take 1 tablet (500 mg total) by mouth 3 (three) times daily. (Patient not taking: Reported on 03/14/2021) 90 tablet 1    Musculoskeletal: Strength & Muscle Tone: within normal limits Gait & Station: normal Patient leans: N/A  Psychiatric Specialty Exam:  Presentation  General Appearance: Disheveled; Bizarre  Eye Contact:None  Speech:Pressured  Speech Volume:Increased  Handedness:Right   Mood and Affect  Mood:Euphoric; Anxious  Affect:Inappropriate; Full Range   Thought Process  Thought Processes:Disorganized  Descriptions of Associations:Loose  Orientation:Partial  Thought Content:Illogical; Obsessions; Paranoid Ideation; Scattered  History of Schizophrenia/Schizoaffective disorder:Yes  Duration of Psychotic Symptoms:Greater than six months  Hallucinations:Hallucinations: Auditory  Ideas of Reference:Paranoia  Suicidal Thoughts:Suicidal Thoughts: No  Homicidal Thoughts:Homicidal Thoughts: No   Sensorium  Memory:Recent Poor; Immediate Poor; Remote Poor  Judgment:Impaired  Insight:Poor   Executive Functions  Concentration:Poor  Attention Span:Poor  Recall:Poor  Fund of Knowledge:Poor  Language:Poor   Psychomotor Activity  Psychomotor Activity:Psychomotor Activity: Normal   Assets  Assets:Communication Skills; Desire for Improvement; Resilience; Social Support   Sleep  Sleep:Sleep: Fair Number of Hours of Sleep: 6   Physical Exam: Physical Exam Vitals and nursing note reviewed.  Constitutional:      Appearance: He is normal weight.  HENT:     Head: Normocephalic and atraumatic.     Nose: Nose normal.     Mouth/Throat:     Mouth: Mucous membranes  are moist.  Cardiovascular:     Rate and Rhythm: Normal rate.     Pulses: Normal pulses.  Pulmonary:     Effort: Pulmonary effort is normal.  Musculoskeletal:         General: Normal range of motion.  Neurological:     Mental Status: He is alert and oriented to person, place, and time.  Psychiatric:        Attention and Perception: He perceives auditory and visual hallucinations.        Mood and Affect: Affect is blunt and inappropriate.        Speech: Speech is tangential.        Behavior: Behavior is withdrawn.        Thought Content: Thought content is paranoid and delusional.        Cognition and Memory: Cognition is impaired. Memory is impaired.        Judgment: Judgment is inappropriate.   Review of Systems  Psychiatric/Behavioral:  Positive for hallucinations and substance abuse. The patient has insomnia.   All other systems reviewed and are negative. Blood pressure 128/73, pulse 79, temperature 98.5 F (36.9 C), temperature source Oral, resp. rate 17, SpO2 100 %. There is no height or weight on file to calculate BMI.  Treatment Plan Summary: Plan - Patient does meet criteria for psychiatric inpatient admission  Disposition: Recommend psychiatric Inpatient admission when medically cleared. Supportive therapy provided about ongoing stressors.  Caroline Sauger, NP 03/14/2021 11:54 PM

## 2021-03-14 NOTE — ED Notes (Signed)
Pt reports schizophrenia history and states is due for the medication shot he gets every 3 weeks for this. When asked if has thoughts, intentions, or plan to harm or kill self or others pt unwilling to answer. Pt ate all items from 1st food tray and asked for 2nd one; given 2nd tray by Jonny Ruiz NT. Pt denies any major medical history. Pt not currently having illusions or hallucinations. However, pt was paranoid checking food tray received from John. Pt otherwise currently calm and cooperative with staff.  ?

## 2021-03-14 NOTE — ED Notes (Signed)
Kim RN to receive pt to [facility] soon.  ?

## 2021-03-15 DIAGNOSIS — R443 Hallucinations, unspecified: Secondary | ICD-10-CM

## 2021-03-15 DIAGNOSIS — F250 Schizoaffective disorder, bipolar type: Secondary | ICD-10-CM | POA: Diagnosis not present

## 2021-03-15 LAB — VALPROIC ACID LEVEL: Valproic Acid Lvl: 100 ug/mL (ref 50.0–100.0)

## 2021-03-15 MED ORDER — DIVALPROEX SODIUM 500 MG PO DR TAB
1000.0000 mg | DELAYED_RELEASE_TABLET | Freq: Every day | ORAL | Status: DC
  Administered 2021-03-15: 1000 mg via ORAL
  Filled 2021-03-15: qty 2

## 2021-03-15 MED ORDER — DIVALPROEX SODIUM 500 MG PO DR TAB: 500.0000 mg | DELAYED_RELEASE_TABLET | Freq: Every evening | ORAL | Status: DC

## 2021-03-15 MED ORDER — NICOTINE 21 MG/24HR TD PT24
21.0000 mg | MEDICATED_PATCH | Freq: Once | TRANSDERMAL | Status: DC
  Administered 2021-03-15: 21 mg via TRANSDERMAL
  Filled 2021-03-15: qty 1

## 2021-03-15 NOTE — ED Provider Notes (Signed)
Procedures ? ?  ? ?----------------------------------------- ?12:47 PM on 03/15/2021 ?----------------------------------------- ? ?Patient requesting to be discharged today.  He is not under involuntary commitment.  To me he denies any SI HI or hallucinations and reports that his symptoms are related to feeling lonely at home.  He denies any safety concerns.  Last had his [place] 1 week ago, follows up with [facility] and ACT. ? ?Discussed with psychiatry Dr. Toni Amend after his evaluation of the patient, he agrees the patient is not committable at this time, not acutely psychotic and retains medical decision-making capacity.  Will discharge in accordance with patient's desires. ? ?  ?Sharman Cheek, MD ?03/15/21 1249 ? ?

## 2021-03-15 NOTE — ED Notes (Signed)
Hospital meal provided.  100% consumed, pt tolerated w/o complaints.  Waste discarded appropriately.   

## 2021-03-15 NOTE — ED Notes (Signed)
Pt requesting to leave; states that he feels better, denies hallucinations. EDP informed. ? ?

## 2021-03-15 NOTE — BH Assessment (Signed)
Patient to be reviewed with [facility] BMU 03/15/21 ?

## 2021-03-15 NOTE — ED Notes (Signed)
Will defer VS until breakfast arrives due to patient sleeping. ?

## 2021-03-15 NOTE — ED Notes (Signed)
Verified correct patient and correct discharge papers given. Pt alert and oriented X 4, stable for discharge. RR even and unlabored, color WNL. Discussed discharge instructions and follow-up as directed. Discharge medications discussed, when prescribed. Pt had opportunity to ask questions, and RN available to provide patient and/or family education.   

## 2021-03-15 NOTE — ED Notes (Signed)
Requesting nicotine patch 

## 2021-03-15 NOTE — Consult Note (Signed)
[facility] Face-to-Face Psychiatry Consult  ° °Reason for Consult: Follow-up consult 31-year-old man with a history of schizophrenia who is here in the emergency room after coming in voluntarily complaining of psychiatric symptoms. °Referring Physician: Stafford °Patient Identification: Kenneth Jensen °MRN:  [id number] °Principal Diagnosis: Schizoaffective disorder, bipolar type (HCC) °Diagnosis:  Principal Problem: °  Schizoaffective disorder, bipolar type (HCC) °Active Problems: °  Hallucinations °  Cannabis use disorder, moderate, dependence (HCC) °  Tobacco use disorder °  Prediabetes °  Abnormal behavior ° ° °Total Time spent with patient: 30 minutes ° °Subjective:   °Makena D Maloof is a 31 y.o. male patient admitted with "I have got to go". ° °HPI: Patient seen and chart reviewed.  31-year-old man known from previous encounters who has chronic mental illness.  He lives by himself.  He says that he is currently seen by the [facility] ACT team.  Patient came to the emergency room complaining of disordered thinking and with some indication of hearing voices.  On interview today the patient tells me he is feeling better.  He denies that he is having any auditory hallucinations currently.  He denies any suicidal thoughts or homicidal thoughts.  Patient states that he feels he will be safe at home and that he will agree to take his medicine and follow up with his ACT team.  His decisions appear to be impulsive but he does appear to understand the risks and benefits of various treatments and also acknowledges that he needs to stay in treatment and take his medicine.  He has been using cannabis at home and acknowledges that that may be a problem.  At no time in this admission has the patient been actively suicidal or homicidal and he is not under involuntary commitment ° °Past Psychiatric History: Patient has a past history of schizophrenia or schizoaffective disorder with multiple prior hospitalizations.  Last seen here  at our hospital in May 2022 but has had other psychiatric encounters.  As far as I know there is no history of suicide attempts or violence ° °Risk to Self:   °Risk to Others:   °Prior Inpatient Therapy:   °Prior Outpatient Therapy:   ° °Past Medical History:  °Past Medical History:  °Diagnosis Date  ° Asthma   ° Bipolar 1 disorder (HCC)   ° Depression   ° Schizophrenia (HCC)   ° Schizotaxia   ° History reviewed. No pertinent surgical history. °Family History: History reviewed. No pertinent family history. °Family Psychiatric  History: See previous °Social History:  °Social History  ° °Substance and Sexual Activity  °Alcohol Use Yes  ° Comment: social  °   °Social History  ° °Substance and Sexual Activity  °Drug Use Yes  ° Types: Marijuana  °  °Social History  ° °Socioeconomic History  ° Marital status: Single  °  Spouse name: Not on file  ° Number of children: Not on file  ° Years of education: Not on file  ° Highest education level: Not on file  °Occupational History  ° Not on file  °Tobacco Use  ° Smoking status: Every Day  ° Smokeless tobacco: Never  °Substance and Sexual Activity  ° Alcohol use: Yes  °  Comment: social  ° Drug use: Yes  °  Types: Marijuana  ° Sexual activity: Not on file  °Other Topics Concern  ° Not on file  °Social History Narrative  ° ** Merged History Encounter **  °    ° °Social Determinants of Health  ° °  Financial Resource Strain: Not on file  °Food Insecurity: Not on file  °Transportation Needs: Not on file  °Physical Activity: Not on file  °Stress: Not on file  °Social Connections: Not on file  ° °Additional Social History: °  ° °Allergies:   °Allergies  °Allergen Reactions  ° Amoxicillin Swelling  ° Haldol [Haloperidol]   °  Pt states "I went crazy"  ° Percocet [Oxycodone-Acetaminophen]   ° Vicodin [Hydrocodone-Acetaminophen] Itching  ° Vicodin [Hydrocodone-Acetaminophen]   ° ° °Labs:  °Results for orders placed or performed during the hospital encounter of 03/14/21 (from the past 48  hour(s))  °Valproic acid level     Status: None  ° Collection Time: 03/14/21  8:31 PM  °Result Value Ref Range  ° Valproic Acid Lvl 100 50.0 - 100.0 ug/mL  °  Comment: Performed at [facility], [address] [place], [place] [address]  °Comprehensive metabolic panel     Status: Abnormal  ° Collection Time: 03/14/21  8:39 PM  °Result Value Ref Range  ° Sodium 140 135 - 145 mmol/L  ° Potassium 4.0 3.5 - 5.1 mmol/L  ° Chloride 103 98 - 111 mmol/L  ° CO2 29 22 - 32 mmol/L  ° Glucose, Bld 126 (H) 70 - 99 mg/dL  °  Comment: Glucose reference range applies only to samples taken after fasting for at least 8 hours.  ° BUN 12 6 - 20 mg/dL  ° Creatinine, Ser 0.78 0.61 - 1.24 mg/dL  ° Calcium 8.4 (L) 8.9 - 10.3 mg/dL  ° Total Protein 6.6 6.5 - 8.1 g/dL  ° Albumin 3.5 3.5 - 5.0 g/dL  ° AST 16 15 - 41 U/L  ° ALT 10 0 - 44 U/L  ° Alkaline Phosphatase 46 38 - 126 U/L  ° Total Bilirubin 0.2 (L) 0.3 - 1.2 mg/dL  ° GFR, Estimated >60 >60 mL/min  °  Comment: (NOTE) °Calculated using the CKD-EPI Creatinine Equation (2021) °  ° Anion gap 8 5 - 15  °  Comment: Performed at [facility], [address] [place], [place] [address]  °Ethanol     Status: None  ° Collection Time: 03/14/21  8:39 PM  °Result Value Ref Range  ° Alcohol, Ethyl (B) <10 <10 mg/dL  °  Comment: (NOTE) °Lowest detectable limit for serum alcohol is 10 mg/dL. ° °For medical purposes only. °Performed at [facility], [address] [place], °[place] [address] °  °Salicylate level     Status: Abnormal  ° Collection Time: 03/14/21  8:39 PM  °Result Value Ref Range  ° Salicylate Lvl <7.0 (L) 7.0 - 30.0 mg/dL  °  Comment: Performed at [facility], [address] [place], [place] [address]  °Acetaminophen level     Status: Abnormal  ° Collection Time: 03/14/21  8:39 PM  °Result Value Ref Range  ° Acetaminophen (Tylenol), Serum <10 (L) 10 - 30 ug/mL  °  Comment: (NOTE) °Therapeutic concentrations vary significantly. A range of 10-30  ug/mL  °may be an effective concentration for many patients. However, some  °are best treated at concentrations outside of this range. °Acetaminophen concentrations >150 ug/mL at 4 hours after ingestion  °and >50 ug/mL at 12 hours after ingestion are often associated with  °toxic reactions. ° °Performed at [facility], [address] [place], °[place] [address] °  °cbc     Status: None  ° Collection Time: 03/14/21  8:39 PM  °Result Value Ref Range  °   WBC 8.4 4.0 - 10.5 K/uL   RBC 4.52 4.22 - 5.81 MIL/uL   Hemoglobin 13.0 13.0 - 17.0 g/dL   HCT 40.0 39.0 - 52.0 %   MCV 88.5 80.0 - 100.0 fL   MCH 28.8 26.0 - 34.0 pg   MCHC 32.5 30.0 - 36.0 g/dL   RDW 13.5 11.5 - 15.5 %   Platelets 285 150 - 400 K/uL   nRBC 0.0 0.0 - 0.2 %    Comment: Performed at [facility], [address], [place], [place] [address]  Urine Drug Screen, Qualitative     Status: Abnormal   Collection Time: 03/14/21  9:27 PM  Result Value Ref Range   Tricyclic, Ur Screen NONE DETECTED NONE DETECTED   Amphetamines, Ur Screen NONE DETECTED NONE DETECTED   MDMA (Ecstasy)Ur Screen NONE DETECTED NONE DETECTED   Cocaine Metabolite,Ur [place] NONE DETECTED NONE DETECTED   Opiate, Ur Screen NONE DETECTED NONE DETECTED   Phencyclidine (PCP) Ur S NONE DETECTED NONE DETECTED   Cannabinoid 50 Ng, Ur [place] POSITIVE (A) NONE DETECTED   Barbiturates, Ur Screen NONE DETECTED NONE DETECTED   Benzodiazepine, Ur Scrn NONE DETECTED NONE DETECTED   Methadone Scn, Ur NONE DETECTED NONE DETECTED    Comment: (NOTE) Tricyclics + metabolites, urine    Cutoff 1000 ng/mL Amphetamines + metabolites, urine  Cutoff 1000 ng/mL MDMA (Ecstasy), urine              Cutoff 500 ng/mL Cocaine Metabolite, urine          Cutoff 300 ng/mL Opiate + metabolites, urine        Cutoff 300 ng/mL Phencyclidine (PCP), urine         Cutoff 25 ng/mL Cannabinoid, urine                 Cutoff 50 ng/mL Barbiturates + metabolites, urine  Cutoff 200  ng/mL Benzodiazepine, urine              Cutoff 200 ng/mL Methadone, urine                   Cutoff 300 ng/mL  The urine drug screen provides only a preliminary, unconfirmed analytical test result and should not be used for non-medical purposes. Clinical consideration and professional judgment should be applied to any positive drug screen result due to possible interfering substances. A more specific alternate chemical method must be used in order to obtain a confirmed analytical result. Gas chromatography / mass spectrometry (GC/MS) is the preferred confirm atory method. Performed at [facility], [address] [place], [place] [address]   Resp Panel by RT-PCR (Flu A&B, Covid) Nasopharyngeal Swab     Status: None   Collection Time: 03/14/21 10:01 PM   Specimen: Nasopharyngeal Swab; Nasopharyngeal(NP) swabs in vial transport medium  Result Value Ref Range   SARS Coronavirus 2 by RT PCR NEGATIVE NEGATIVE    Comment: (NOTE) SARS-CoV-2 target nucleic acids are NOT DETECTED.  The SARS-CoV-2 RNA is generally detectable in upper respiratory specimens during the acute phase of infection. The lowest concentration of SARS-CoV-2 viral copies this assay can detect is 138 copies/mL. A negative result does not preclude SARS-Cov-2 infection and should not be used as the sole basis for treatment or other patient management decisions. A negative result may occur with  improper specimen collection/handling, submission of specimen other than nasopharyngeal swab, presence of viral mutation(s) within the areas targeted by this assay, and inadequate number of viral copies(<138 copies/mL).  A negative result must be combined with °clinical observations, patient history, and epidemiological °information. The expected result is Negative. ° °Fact Sheet for Patients:  °https://www.fda.gov/media/152166/download ° °Fact Sheet for Healthcare Providers:   °https://www.fda.gov/media/152162/download ° °This test is no t yet approved or cleared by the [place] FDA and  °has been authorized for detection and/or diagnosis of SARS-CoV-2 by °FDA under an Emergency Use Authorization (EUA). This EUA will remain  °in effect (meaning this test can be used) for the duration of the °COVID-19 declaration under Section 564(b)(1) of the Act, 21 °U.S.C.section 360bbb-3(b)(1), unless the authorization is terminated  °or revoked sooner.  ° ° °  ° Influenza A by PCR NEGATIVE NEGATIVE  ° Influenza B by PCR NEGATIVE NEGATIVE  °  Comment: (NOTE) °The Xpert Xpress SARS-CoV-2/FLU/RSV plus assay is intended as an aid °in the diagnosis of influenza from Nasopharyngeal swab specimens and °should not be used as a sole basis for treatment. Nasal washings and °aspirates are unacceptable for Xpert Xpress SARS-CoV-2/FLU/RSV °testing. ° °Fact Sheet for Patients: °https://www.fda.gov/media/152166/download ° °Fact Sheet for Healthcare Providers: °https://www.fda.gov/media/152162/download ° °This test is not yet approved or cleared by the [place] FDA and °has been authorized for detection and/or diagnosis of SARS-CoV-2 by °FDA under an Emergency Use Authorization (EUA). This EUA will remain °in effect (meaning this test can be used) for the duration of the °COVID-19 declaration under Section 564(b)(1) of the Act, 21 U.S.C. °section 360bbb-3(b)(1), unless the authorization is terminated or °revoked. ° °Performed at [facility], [address] [place], °[place] [address] °  ° ° °Current Facility-Administered Medications  °Medication Dose Route Frequency Provider Last Rate Last Admin  ° divalproex (DEPAKOTE) DR tablet 1,000 mg  1,000 mg Oral Q breakfast Stafford, Phillip, MD   1,000 mg at 03/15/21 1002  ° divalproex (DEPAKOTE) DR tablet 500 mg  500 mg Oral QPM Stafford, Phillip, MD      ° nicotine (NICODERM CQ - dosed in mg/24 hours) patch 21 mg  21 mg Transdermal Once Stafford,  Phillip, MD   21 mg at 03/15/21 0952  ° °Current Outpatient Medications  °Medication Sig Dispense Refill  ° paliperidone (INVEGA SUSTENNA) 234 MG/1.5ML SUSY injection Inject 234 mg into the muscle every 30 (thirty) days.    ° divalproex (DEPAKOTE ER) 500 MG 24 hr tablet Take 1 tablet (500 mg total) by mouth 3 (three) times daily. (Patient not taking: Reported on 03/14/2021) 90 tablet 1  ° ° °Musculoskeletal: °Strength & Muscle Tone: within normal limits °Gait & Station: normal °Patient leans: N/A ° ° ° ° ° ° ° ° ° ° ° °Psychiatric Specialty Exam: ° °Presentation  °General Appearance: Disheveled; Bizarre ° °Eye Contact:None ° °Speech:Pressured ° °Speech Volume:Increased ° °Handedness:Right ° ° °Mood and Affect  °Mood:Euphoric; Anxious ° °Affect:Inappropriate; Full Range ° ° °Thought Process  °Thought Processes:Disorganized ° °Descriptions of Associations:Loose ° °Orientation:Partial ° °Thought Content:Illogical; Obsessions; Paranoid Ideation; Scattered ° °History of Schizophrenia/Schizoaffective disorder:Yes ° °Duration of Psychotic Symptoms:Greater than six months ° °Hallucinations:Hallucinations: Auditory ° °Ideas of Reference:Paranoia ° °Suicidal Thoughts:Suicidal Thoughts: No ° °Homicidal Thoughts:Homicidal Thoughts: No ° ° °Sensorium  °Memory:Recent Poor; Immediate Poor; Remote Poor ° °Judgment:Impaired ° °Insight:Poor ° ° °Executive Functions  °Concentration:Poor ° °Attention Span:Poor ° °Recall:Poor ° °Fund of Knowledge:Poor ° °Language:Poor ° ° °Psychomotor Activity  °Psychomotor Activity:Psychomotor Activity: Normal ° ° °Assets  °Assets:Communication Skills; Desire for Improvement; Resilience; Social Support ° ° °Sleep  °Sleep:Sleep: Fair °Number of Hours of Sleep: 6 ° ° °Physical Exam: °Physical   Exam Vitals and nursing note reviewed.  Constitutional:      Appearance: Normal appearance.  HENT:     Head: Normocephalic and atraumatic.     Mouth/Throat:     Pharynx: Oropharynx is clear.  Eyes:      Pupils: Pupils are equal, round, and reactive to light.  Cardiovascular:     Rate and Rhythm: Normal rate and regular rhythm.  Pulmonary:     Effort: Pulmonary effort is normal.     Breath sounds: Normal breath sounds.  Abdominal:     General: Abdomen is flat.     Palpations: Abdomen is soft.  Musculoskeletal:        General: Normal range of motion.  Skin:    General: Skin is warm and dry.  Neurological:     General: No focal deficit present.     Mental Status: He is alert. Mental status is at baseline.  Psychiatric:        Attention and Perception: He is inattentive.        Mood and Affect: Mood normal. Affect is blunt.        Speech: Speech normal.        Behavior: Behavior is cooperative.        Thought Content: Thought content normal.        Cognition and Memory: Memory is impaired.        Judgment: Judgment is impulsive.   Review of Systems  Constitutional: Negative.   HENT: Negative.    Eyes: Negative.   Respiratory: Negative.    Cardiovascular: Negative.   Gastrointestinal: Negative.   Musculoskeletal: Negative.   Skin: Negative.   Neurological: Negative.   Psychiatric/Behavioral: Negative.    Blood pressure 131/72, pulse 81, temperature 98.3 F (36.8 C), temperature source Oral, resp. rate 17, SpO2 100 %. There is no height or weight on file to calculate BMI.  Treatment Plan Summary: Plan patient has been recommended for admission to the psychiatric unit.  I met with him and explained that the rationale for admission is that he has been to the ER in and out over the last couple days with the same symptoms.  He tells me himself that at home he hears like a lot of noises coming out of his house and he gets nervous.  I pointed out to him that just coming into the hospital even for a few days we could adjust his medicine and get him feeling better so that he would need to be readmitted.  Patient states that he feels safe going home and that he feels like it is making him  too nervous to stay here.  He understands where he is and understands the options being offered to him and there is no indication of any acute dangerousness that would justify commitment.  He is stating that he wants to go home and be discharged.  In my opinion this is not the right medical decision but I do not think he meets commitment criteria.  I should make it clear that I believe that he is leaving the hospital [place] but that he does not meet IVC criteria.  He has been strongly advised to continue on outpatient medicine continue working with his outpatient psychiatric providers and to return to the hospital if needed.  Also advised to avoid cannabis and other drugs  Disposition:  Released [place], MD 03/15/2021 1:01 PM

## 2021-03-15 NOTE — ED Notes (Signed)
EDP at bedside to assess.   

## 2021-04-12 IMAGING — CR ABDOMEN - 1 VIEW
1 series · 2 of 2 positions shown · non-contrast
Comparison: None.

CLINICAL DATA: Constipation

EXAM:
ABDOMEN - 1 VIEW

[Series 1: dg abd 1 view · 0.14mm/px · 2 of 2 slices shown]
[im 1/2]
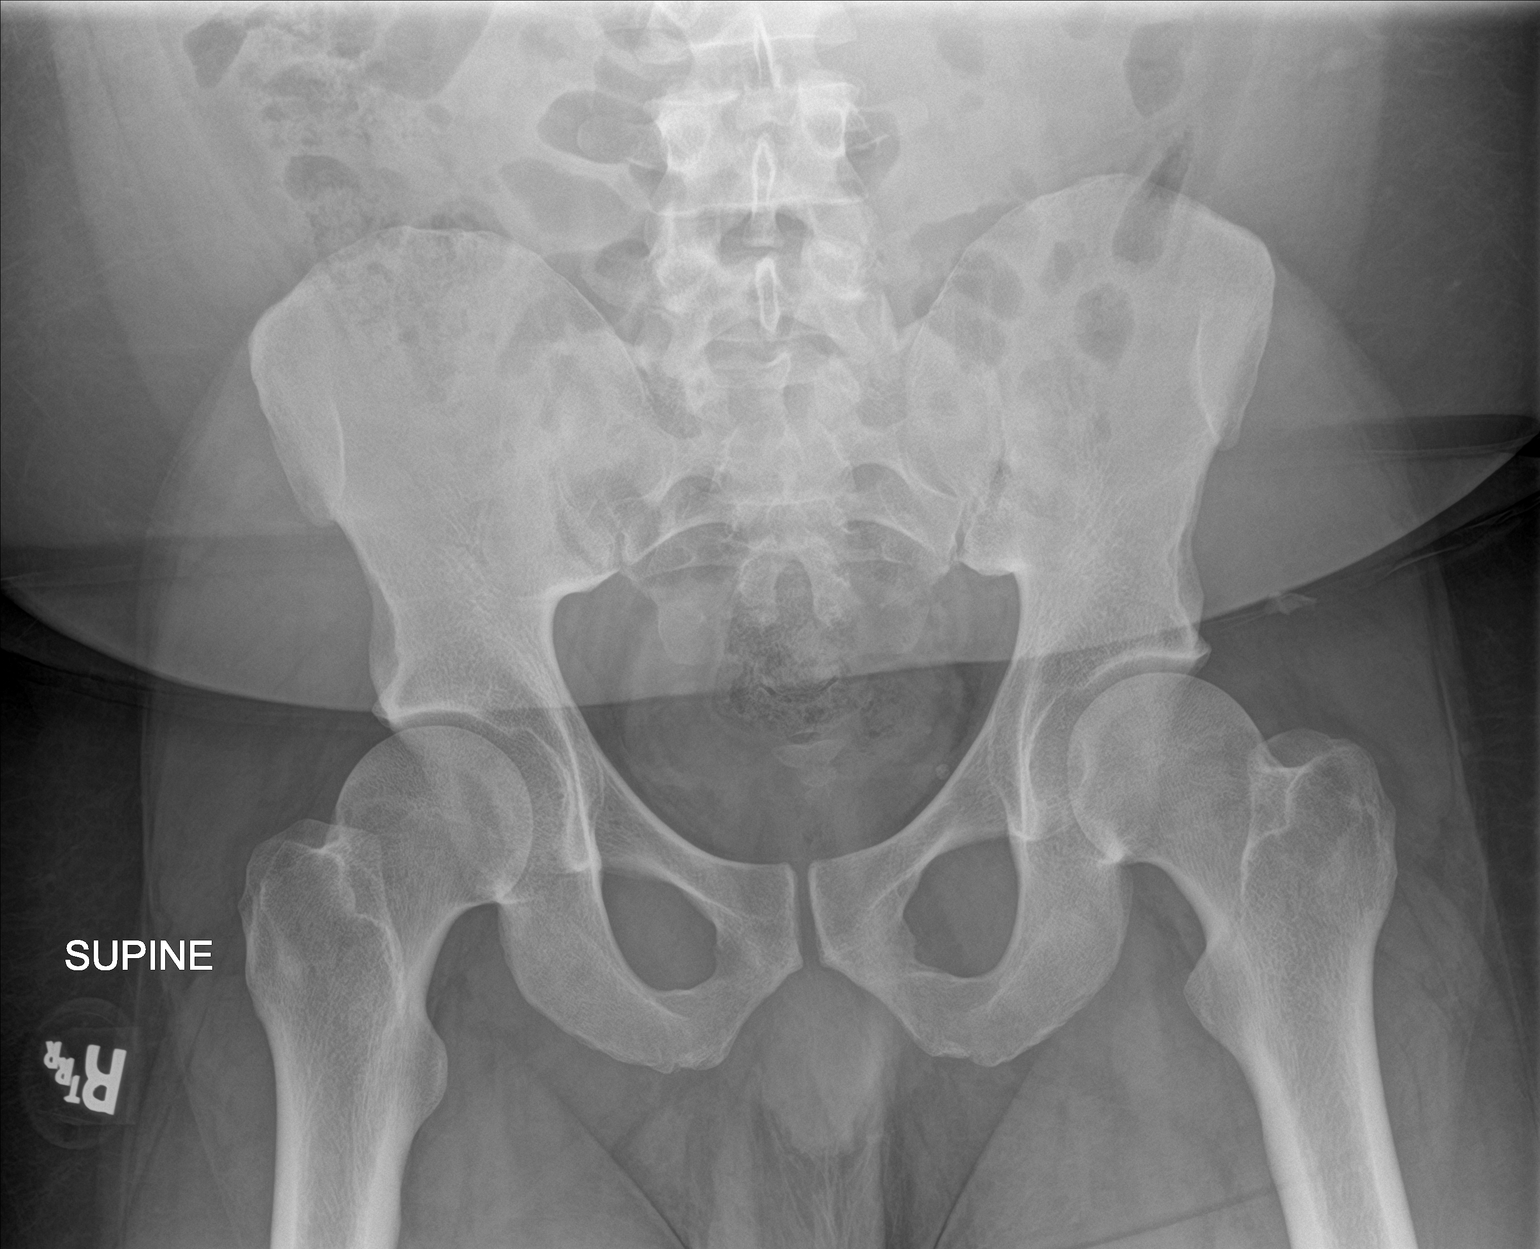
[im 2/2]
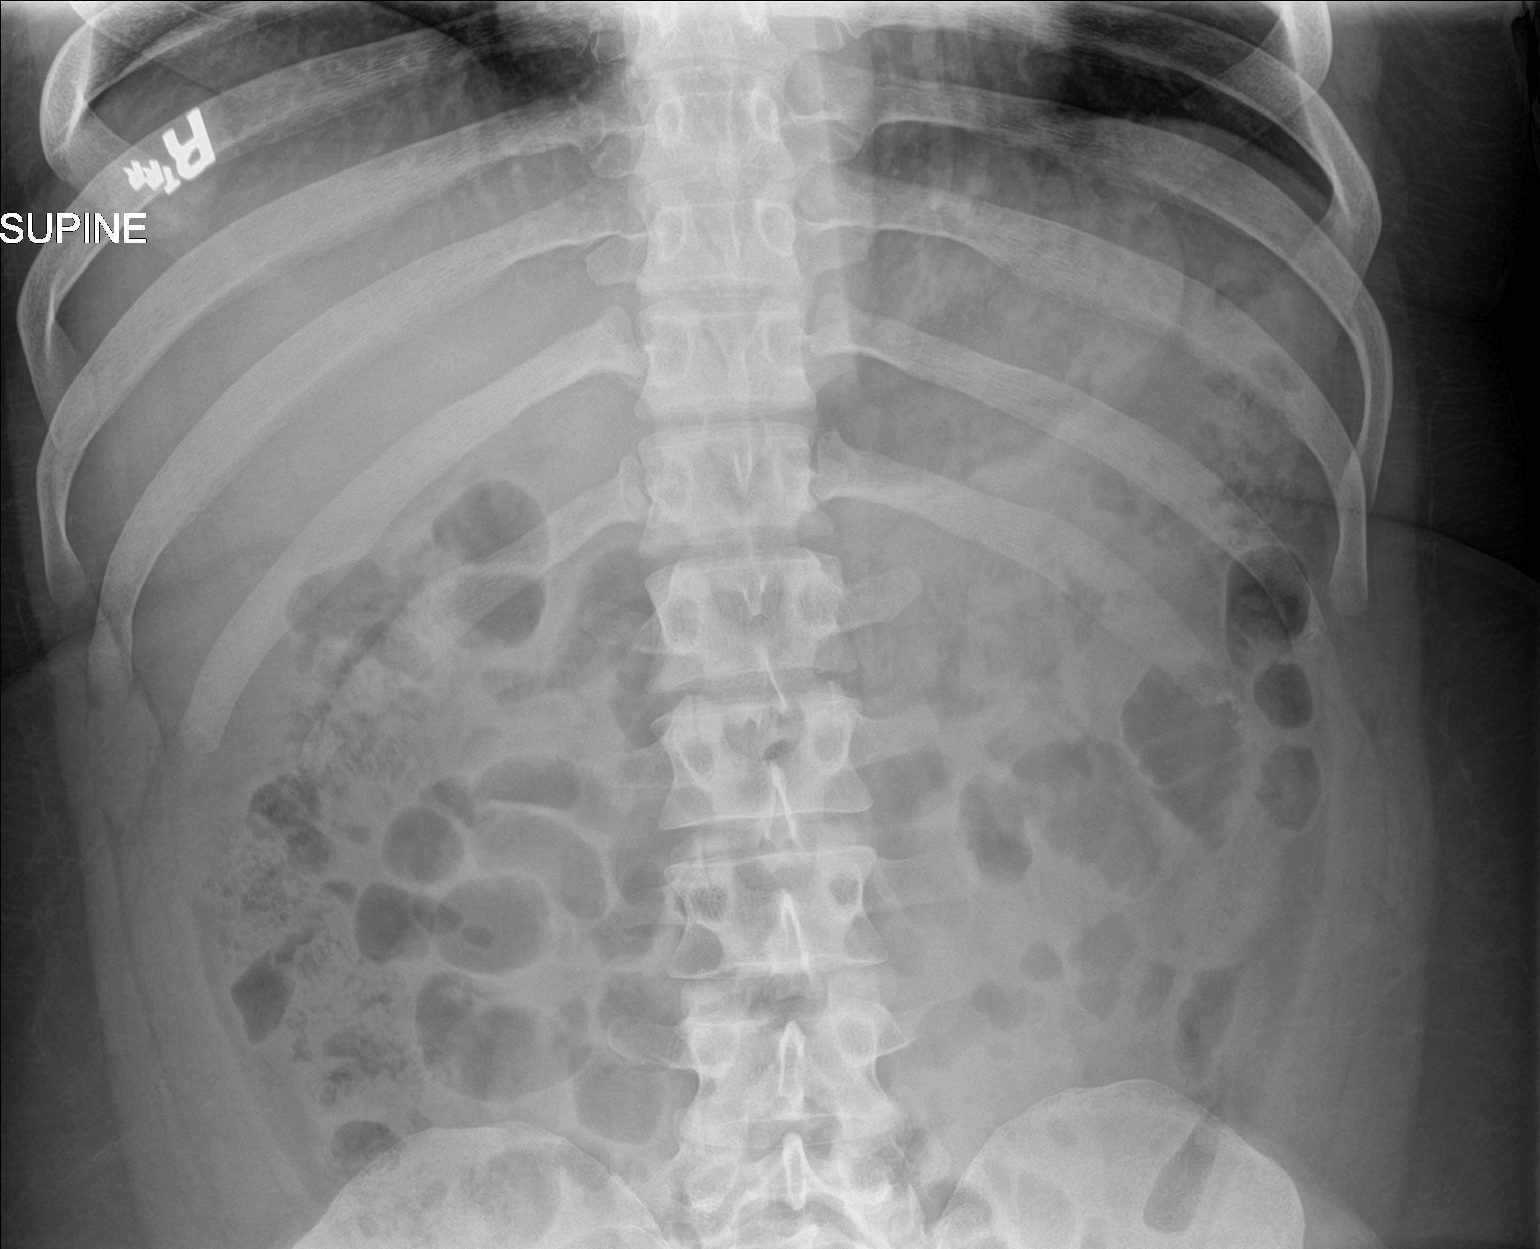

[2 of 2 positions shown; findings below may reference images not displayed]

FINDINGS: The bowel gas pattern is normal. No radio-opaque calculi or other
significant radiographic abnormality are seen. Moderate stool in the
colon
IMPRESSION: Negative.  Moderate stool in the colon

## 2021-04-13 IMAGING — DX PORTABLE CHEST - 1 VIEW
1 series · 1 of 1 positions shown · non-contrast
Comparison: None.

CLINICAL DATA: Chest pain high white count

EXAM:
PORTABLE CHEST 1 VIEW

[chest ap]
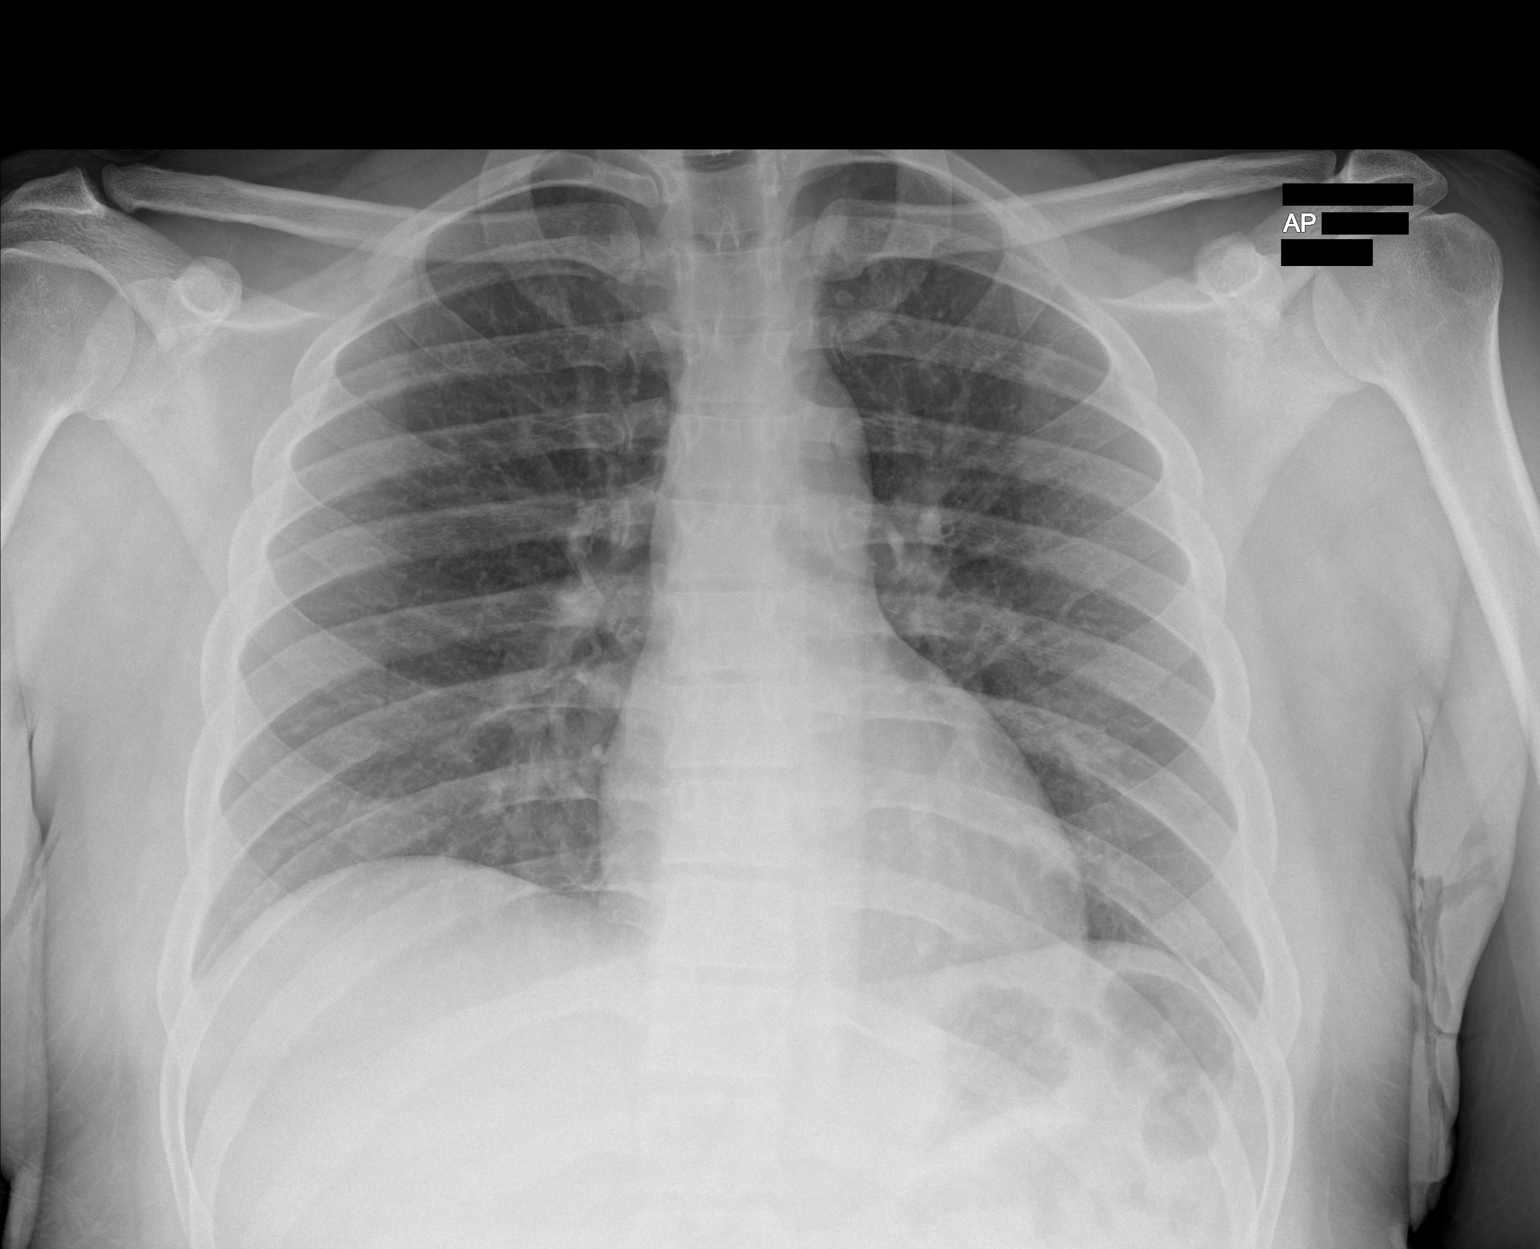

[1 of 1 positions shown; findings below may reference images not displayed]

FINDINGS: The heart size and mediastinal contours are within normal limits.
Both lungs are clear. The visualized skeletal structures are
unremarkable.
IMPRESSION: No active disease.

## 2021-04-13 IMAGING — CT CT ABDOMEN AND PELVIS WITHOUT CONTRAST
2 of 8 series · 15 of 46 positions shown, 17 images · non-contrast
Comparison: Plain films of the abdomen of earlier today. No prior
CT.

CLINICAL DATA: Constipation. Mental status changes. Schizophrenia.

EXAM:
CT ABDOMEN AND PELVIS WITHOUT CONTRAST
TECHNIQUE: Multidetector CT imaging of the abdomen and pelvis was performed
following the standard protocol without IV contrast.

[Series 2: axial st · axial · 0.79mm/px · z∈[-995,-535]mm · 12 of 106 slices shown, 14 images]
[im 7/106  soft-tissue]
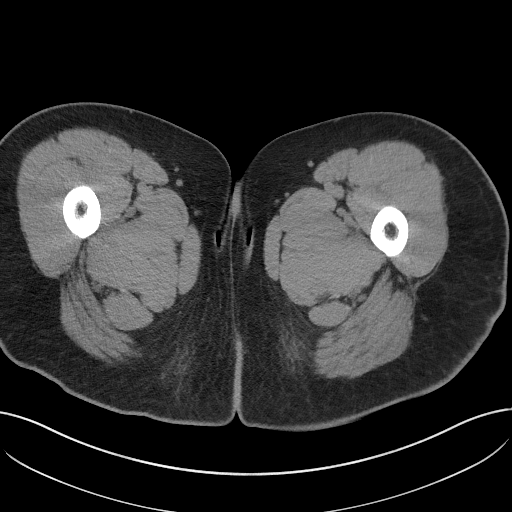
[im 7/106  bone]
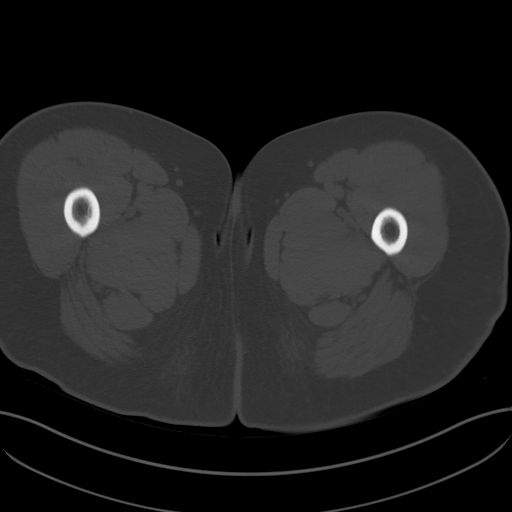
[im 19/106  soft-tissue]
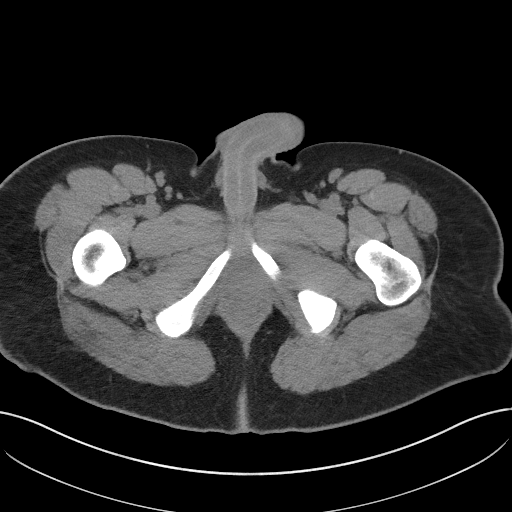
[im 25/106  soft-tissue]
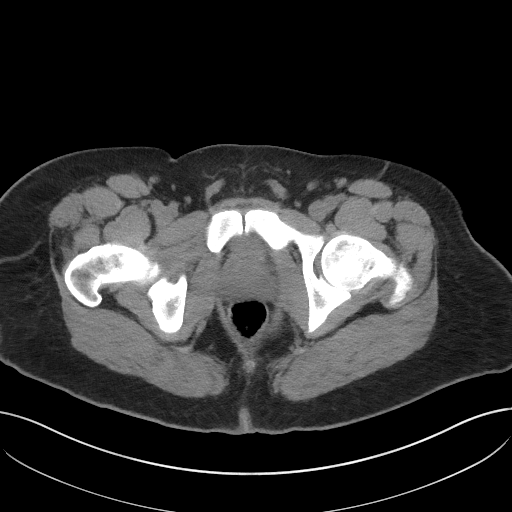
[im 31/106  soft-tissue]
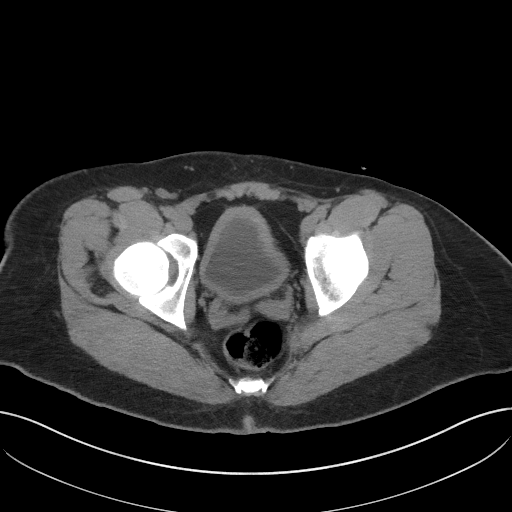
[im 44/106  soft-tissue]
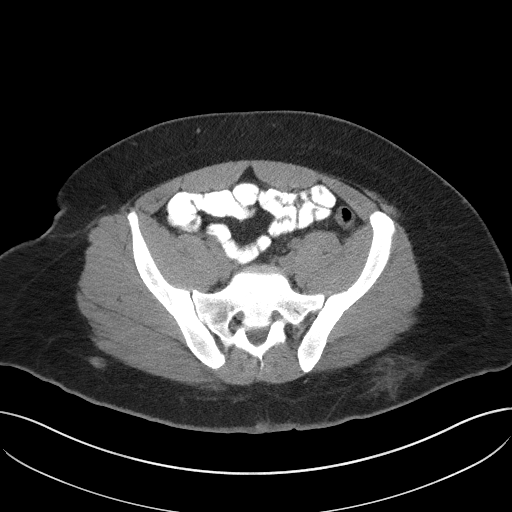
[im 50/106  soft-tissue]
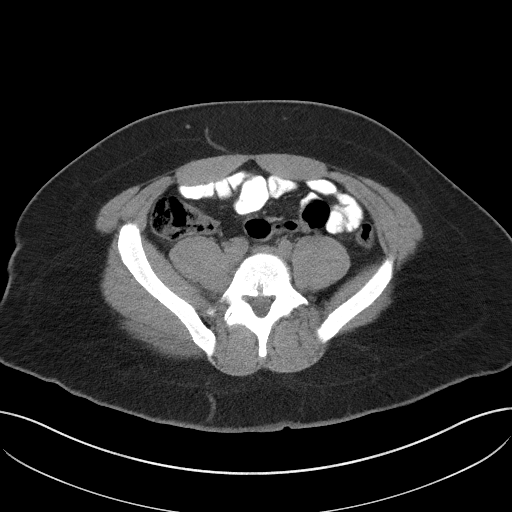
[im 56/106  soft-tissue]
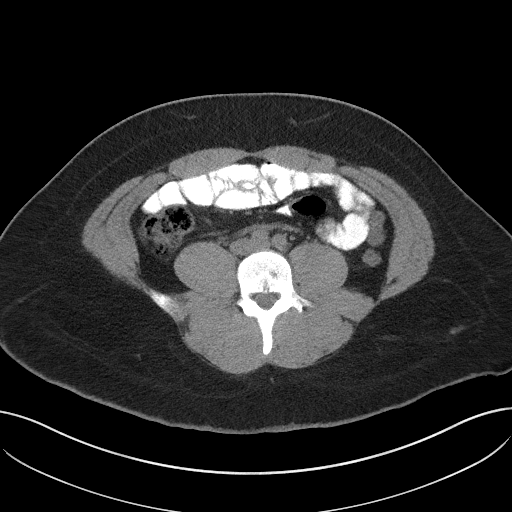
[im 68/106  soft-tissue]
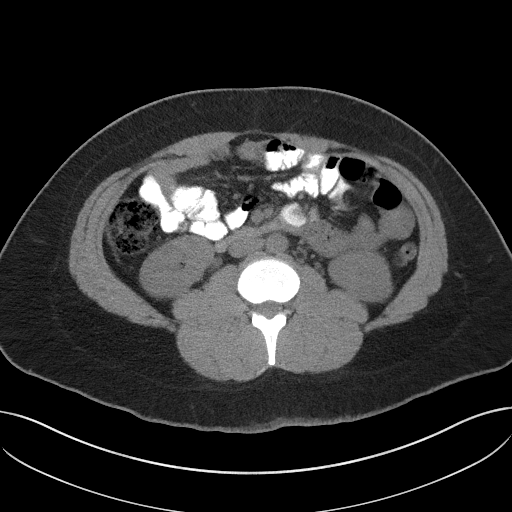
[im 75/106  soft-tissue]
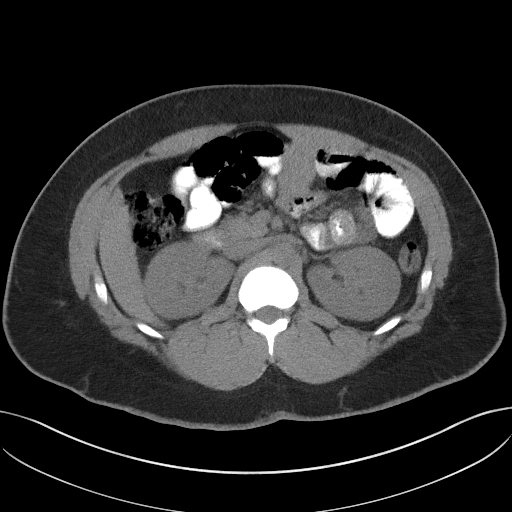
[im 75/106  bone]
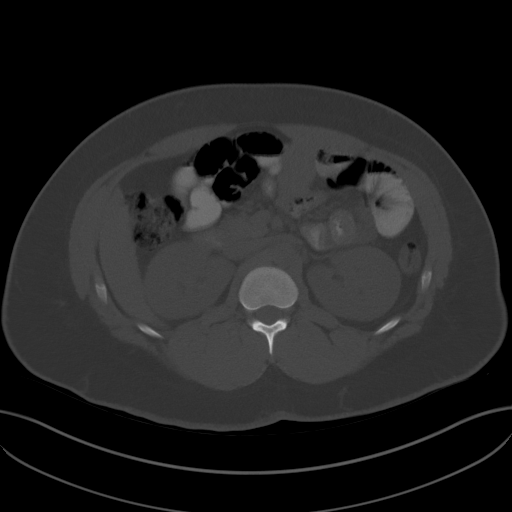
[im 81/106  soft-tissue]
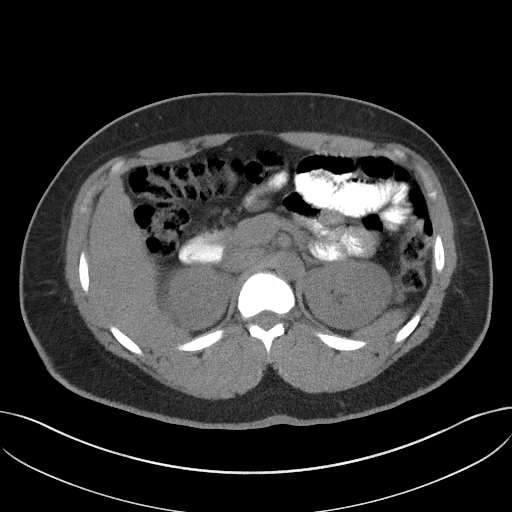
[im 93/106  soft-tissue]
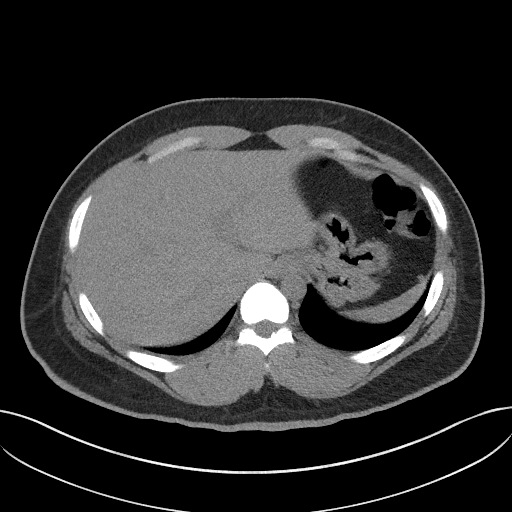
[im 99/106  soft-tissue]
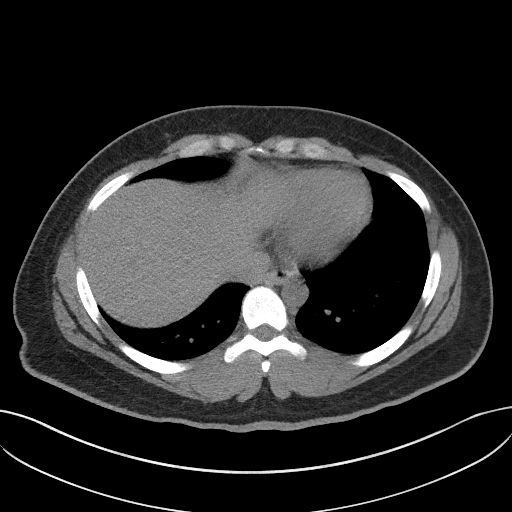

[Series 6: coronal st · coronal · 0.75mm/px · 3 of 83 slices shown]
[im 21/83  soft-tissue]
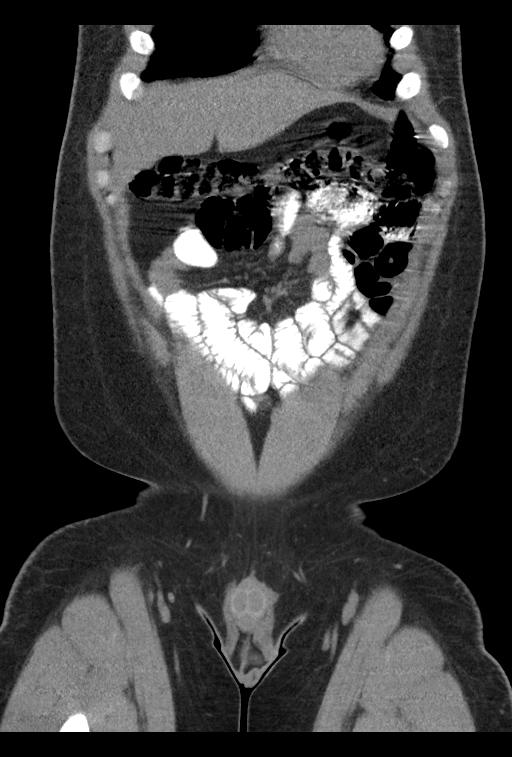
[im 42/83  soft-tissue]
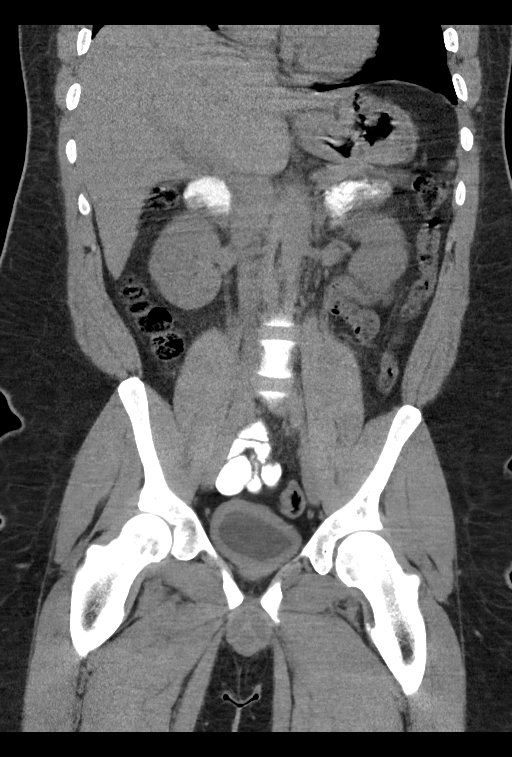
[im 62/83  soft-tissue]
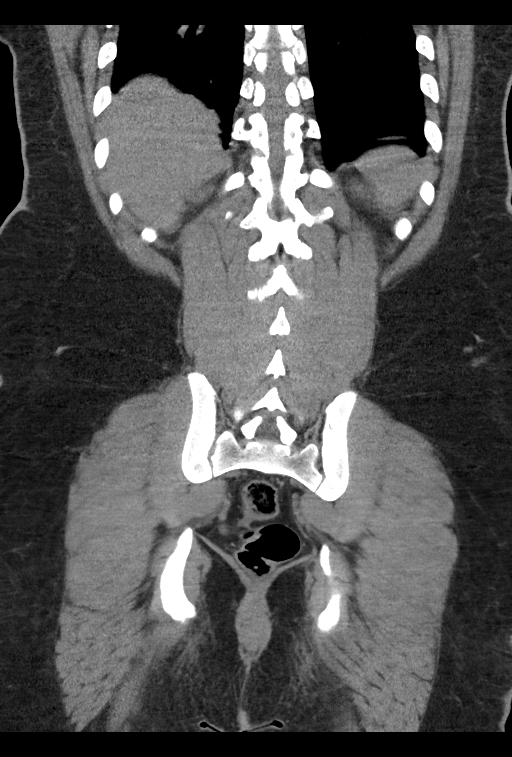

[15 of 46 positions shown; findings below may reference images not displayed]

FINDINGS: Lower chest: Clear lung bases. Normal heart size without pericardial
or pleural effusion. Bilateral gynecomastia.

Hepatobiliary: Normal noncontrast appearance of the liver,
gallbladder, biliary tract.

Pancreas: Normal, without mass or ductal dilatation. Mild motion
degradation throughout the abdomen.

Spleen: Normal in size, without focal abnormality.

Adrenals/Urinary Tract: Normal adrenal glands. No renal calculi or
hydronephrosis. No hydroureter or ureteric calculi. No bladder
calculi.

Stomach/Bowel: Normal stomach, without wall thickening. Normal
colon, appendix, and terminal ileum. Normal small bowel. No free
intraperitoneal air.

Vascular/Lymphatic: Normal caliber of the aorta and branch vessels.
No abdominopelvic adenopathy.

Reproductive: Normal prostate.

Other: No significant free fluid. Subcutaneous nodularity. A 1.3 cm
nodule superficial the left gluteal musculature on 66/2 has
surrounding edema.

A nodule or node within the right ischioanal fossa/posterior
perineum on image 98/2.

Musculoskeletal: No acute osseous abnormality.
IMPRESSION: 1. Motion and lack of IV contrast degradation.
2.  No acute process in the abdomen or pelvis.
3. Subcutaneous nodularity as detailed above. Consider physical exam
correlation.
4. Gynecomastia.

## 2021-04-20 ENCOUNTER — Emergency Department
Admission: EM | Admit: 2021-04-20 | Discharge: 2021-04-21 | Disposition: A | Discharge status: Home / Self Care | Payer: Medicare HMO | Attending: Emergency Medicine | Admitting: Emergency Medicine

## 2021-04-20 ENCOUNTER — Encounter: Payer: Self-pay | Admitting: Emergency Medicine

## 2021-04-20 ENCOUNTER — Other Ambulatory Visit: Payer: Self-pay

## 2021-04-20 DIAGNOSIS — Z20822 Contact with and (suspected) exposure to covid-19: Secondary | ICD-10-CM | POA: Insufficient documentation

## 2021-04-20 DIAGNOSIS — Z0184 Encounter for antibody response examination: Secondary | ICD-10-CM | POA: Insufficient documentation

## 2021-04-20 DIAGNOSIS — F250 Schizoaffective disorder, bipolar type: Secondary | ICD-10-CM | POA: Diagnosis present

## 2021-04-20 DIAGNOSIS — R45850 Homicidal ideations: Secondary | ICD-10-CM | POA: Diagnosis not present

## 2021-04-20 DIAGNOSIS — F29 Unspecified psychosis not due to a substance or known physiological condition: Secondary | ICD-10-CM | POA: Insufficient documentation

## 2021-04-20 DIAGNOSIS — F209 Schizophrenia, unspecified: Secondary | ICD-10-CM

## 2021-04-20 DIAGNOSIS — R440 Auditory hallucinations: Secondary | ICD-10-CM | POA: Diagnosis present

## 2021-04-20 LAB — COMPREHENSIVE METABOLIC PANEL
ALT: 12 U/L (ref 0–44)
AST: 24 U/L (ref 15–41)
Albumin: 4.3 g/dL (ref 3.5–5.0)
Alkaline Phosphatase: 59 U/L (ref 38–126)
Anion gap: 10 (ref 5–15)
BUN: 11 mg/dL (ref 6–20)
CO2: 27 mmol/L (ref 22–32)
Calcium: 8.9 mg/dL (ref 8.9–10.3)
Chloride: 100 mmol/L (ref 98–111)
Creatinine, Ser: 0.82 mg/dL (ref 0.61–1.24)
GFR, Estimated: >60 mL/min (ref >60)
Glucose, Bld: 120 mg/dL — ABNORMAL HIGH (ref 70–99)
Potassium: 3.6 mmol/L (ref 3.5–5.1)
Sodium: 137 mmol/L (ref 135–145)
Total Bilirubin: 0.4 mg/dL (ref 0.3–1.2)
Total Protein: 7.5 g/dL (ref 6.5–8.1)

## 2021-04-20 LAB — CBC
HCT: 41.9 % (ref 39.0–52.0)
Hemoglobin: 14 g/dL (ref 13.0–17.0)
MCH: 29.1 pg (ref 26.0–34.0)
MCHC: 33.4 g/dL (ref 30.0–36.0)
MCV: 87.1 fL (ref 80.0–100.0)
Platelets: 347 10*3/uL (ref 150–400)
RBC: 4.81 MIL/uL (ref 4.22–5.81)
RDW: 13.6 % (ref 11.5–15.5)
WBC: 10.6 10*3/uL — ABNORMAL HIGH (ref 4.0–10.5)
nRBC: 0 % (ref 0.0–0.2)

## 2021-04-20 LAB — ETHANOL: Alcohol, Ethyl (B): <10 mg/dL (ref <10)

## 2021-04-20 MED ORDER — ALUM & MAG HYDROXIDE-SIMETH 200-200-20 MG/5ML PO SUSP: 30.0000 mL | Freq: Four times a day (QID) | ORAL | Status: DC | PRN

## 2021-04-20 MED ORDER — NICOTINE 21 MG/24HR TD PT24: 21.0000 mg | MEDICATED_PATCH | Freq: Every day | TRANSDERMAL | Status: DC | PRN

## 2021-04-20 MED ORDER — IBUPROFEN 600 MG PO TABS: 600.0000 mg | ORAL_TABLET | Freq: Three times a day (TID) | ORAL | Status: DC | PRN

## 2021-04-20 NOTE — ED Notes (Signed)
Pt changed to blue hospital scrubs, pt's cloths in pt belonging bag. Placed in bag, pink hat, white pair of socks, black pair of shoes, colorful pair of pants. White shirt, colorful coat. Black wallet, a black cell phone and some keys in a ref key change.  ?

## 2021-04-20 NOTE — ED Notes (Signed)
Pt. Transferred from Triage to room 5 after dressing out and screening for contraband. Report to include Situation, Background, Assessment and Recommendations from [facility]. Pt. Oriented to Quad including Q15 minute rounds as well as Psychologist, counselling for their protection. Patient is alert and oriented, warm and dry in no acute distress. Patient denies SI, HI, and AVH. Pt. Encouraged to let me know if needs arise. ? ?

## 2021-04-20 NOTE — ED Notes (Addendum)
Dr. Scotty Court in triage 2 talking to pt  ?

## 2021-04-20 NOTE — ED Provider Notes (Addendum)
? ?[facility] ?Provider Note ? ? ? Event Date/Time  ? First MD Initiated Contact with Patient 04/20/21 2311   ?  (approximate) ? ? ?History  ? ?Psychiatric Evaluation ? ? ?HPI ? ?IDEN STRIPLING is a 31 y.o. male with a history of schizoaffective disorder and auditory hallucinations who comes to the ED complaining of feeling lonely, auditory hallucinations.  No visual hallucinations.  No SI or HI.  Feels upset that he perceives his family members are supporting each other financially more than they support him.  No drug use.  No recent illness or pain or fever. ? ?During interview he starts laughing and says that someone in the "spirit world" told him something crazy. ? ?Physical Exam  ? ?Triage Vital Signs: ?ED Triage Vitals  ?Enc Vitals Group  ?   BP 04/20/21 2317 130/79  ?   Pulse Rate 04/20/21 2317 81  ?   Resp 04/20/21 2317 18  ?   Temp 04/20/21 2317 98.3 ?F (36.8 ?C)  ?   Temp Source 04/20/21 2317 Oral  ?   SpO2 04/20/21 2317 97 %  ?   Weight 04/20/21 2318 200 lb (90.7 kg)  ?   Height 04/20/21 2318 5\' 5"  (1.651 m)  ?   Head Circumference --   ?   Peak Flow --   ?   Pain Score 04/20/21 2318 0  ?   Pain Loc --   ?   Pain Edu? --   ?   Excl. in GC? --   ? ? ?Most recent vital signs: ?Vitals:  ? 04/20/21 2317  ?BP: 130/79  ?Pulse: 81  ?Resp: 18  ?Temp: 98.3 ?F (36.8 ?C)  ?SpO2: 97%  ? ? ? ?General: Awake, no distress.  ?CV:  Good peripheral perfusion.  ?Resp:  Normal effort.  ?Abd:  No distention.  ?Other:  No wounds.  EOMI. ? ? ?ED Results / Procedures / Treatments  ? ?Labs ?(all labs ordered are listed, but only abnormal results are displayed) ?Labs Reviewed  ?RESP PANEL BY RT-PCR (FLU A&B, COVID) ARPGX2  ?COMPREHENSIVE METABOLIC PANEL  ?ETHANOL  ?CBC  ?URINE DRUG SCREEN, QUALITATIVE ([facility] ONLY)  ?SAR COV2 SEROLOGY (COVID19)AB(IGG),IA  ? ? ? ?EKG ? ? ? ? ?RADIOLOGY ? ? ? ? ?PROCEDURES: ? ?Critical Care performed: No ? ?Procedures ? ? ?MEDICATIONS ORDERED IN ED: ?Medications  ?alum &  mag hydroxide-simeth (MAALOX/MYLANTA) 200-200-20 MG/5ML suspension 30 mL (has no administration in time range)  ?ibuprofen (ADVIL) tablet 600 mg (has no administration in time range)  ?nicotine (NICODERM CQ - dosed in mg/24 hours) patch 21 mg (has no administration in time range)  ? ? ? ?IMPRESSION / MDM / ASSESSMENT AND PLAN / ED COURSE  ?I reviewed the triage vital signs and the nursing notes. ?             ?               ?Patient presents with some auditory hallucinations, consistent with his chronic schizophrenia symptoms.  I do not think he has any acute psychosis, not an imminent danger to himself or others and not committable.  However with his complicated history, I will consult behavioral medicine.  He has no acute medical complaints, has normal vital signs, reassuring exam, nontoxic appearance and is medically clear. ? ?The patient has been placed in psychiatric observation due to the need to provide a safe environment for the patient while obtaining psychiatric consultation and evaluation, as  well as ongoing medical and medication management to treat the patient's condition.  The patient has not been placed under full IVC at this time. ? ? ?  ? ? ?FINAL CLINICAL IMPRESSION(S) / ED DIAGNOSES  ? ?Final diagnoses:  ?Schizophrenia, unspecified type (HCC)  ? ? ? ?Rx / DC Orders  ? ?ED Discharge Orders   ? ? None  ? ?  ? ? ? ?Note:  This document was prepared using Dragon voice recognition software and may include unintentional dictation errors. ?  ?Sharman Cheek, MD ?04/20/21 2337 ? ?  ?Sharman Cheek, MD ?04/21/21 0018 ? ?

## 2021-04-20 NOTE — ED Triage Notes (Signed)
Pt reports feeling lonely. Pt accompanied by [employer]. Pt reports history of Schizophrenia, pt reports he feels irritated. Denies SI,HI, reports he is not hearing things but everything that he hears is real denies any visual hallucinations  ?

## 2021-04-21 ENCOUNTER — Emergency Department (EMERGENCY_DEPARTMENT_HOSPITAL)
Admission: EM | Admit: 2021-04-21 | Discharge: 2021-04-22 | Payer: Medicare HMO | Source: Home / Self Care | Attending: Emergency Medicine | Admitting: Emergency Medicine

## 2021-04-21 ENCOUNTER — Other Ambulatory Visit: Payer: Self-pay

## 2021-04-21 DIAGNOSIS — Y900 Blood alcohol level of less than 20 mg/100 ml: Secondary | ICD-10-CM | POA: Insufficient documentation

## 2021-04-21 DIAGNOSIS — F29 Unspecified psychosis not due to a substance or known physiological condition: Secondary | ICD-10-CM

## 2021-04-21 DIAGNOSIS — F250 Schizoaffective disorder, bipolar type: Secondary | ICD-10-CM | POA: Diagnosis present

## 2021-04-21 DIAGNOSIS — Z20822 Contact with and (suspected) exposure to covid-19: Secondary | ICD-10-CM | POA: Insufficient documentation

## 2021-04-21 DIAGNOSIS — R45850 Homicidal ideations: Secondary | ICD-10-CM

## 2021-04-21 LAB — URINE DRUG SCREEN, QUALITATIVE (ARMC ONLY)
Amphetamines, Ur Screen: NOT DETECTED
Barbiturates, Ur Screen: NOT DETECTED
Benzodiazepine, Ur Scrn: NOT DETECTED
Cannabinoid 50 Ng, Ur ~~LOC~~: POSITIVE — AB
Cocaine Metabolite,Ur ~~LOC~~: NOT DETECTED
MDMA (Ecstasy)Ur Screen: NOT DETECTED
Methadone Scn, Ur: NOT DETECTED
Opiate, Ur Screen: NOT DETECTED
Phencyclidine (PCP) Ur S: NOT DETECTED
Tricyclic, Ur Screen: NOT DETECTED

## 2021-04-21 LAB — CBC
HCT: 42.1 % (ref 39.0–52.0)
Hemoglobin: 13.9 g/dL (ref 13.0–17.0)
MCH: 29 pg (ref 26.0–34.0)
MCHC: 33 g/dL (ref 30.0–36.0)
MCV: 87.7 fL (ref 80.0–100.0)
Platelets: 332 10*3/uL (ref 150–400)
RBC: 4.8 MIL/uL (ref 4.22–5.81)
RDW: 13.6 % (ref 11.5–15.5)
WBC: 9.4 10*3/uL (ref 4.0–10.5)
nRBC: 0 % (ref 0.0–0.2)

## 2021-04-21 LAB — COMPREHENSIVE METABOLIC PANEL
ALT: 12 U/L (ref 0–44)
AST: 21 U/L (ref 15–41)
Albumin: 4.4 g/dL (ref 3.5–5.0)
Alkaline Phosphatase: 55 U/L (ref 38–126)
Anion gap: 9 (ref 5–15)
BUN: 10 mg/dL (ref 6–20)
CO2: 27 mmol/L (ref 22–32)
Calcium: 8.8 mg/dL — ABNORMAL LOW (ref 8.9–10.3)
Chloride: 100 mmol/L (ref 98–111)
Creatinine, Ser: 0.78 mg/dL (ref 0.61–1.24)
GFR, Estimated: >60 mL/min (ref >60)
Glucose, Bld: 121 mg/dL — ABNORMAL HIGH (ref 70–99)
Potassium: 4 mmol/L (ref 3.5–5.1)
Sodium: 136 mmol/L (ref 135–145)
Total Bilirubin: 0.4 mg/dL (ref 0.3–1.2)
Total Protein: 7.4 g/dL (ref 6.5–8.1)

## 2021-04-21 LAB — RESP PANEL BY RT-PCR (FLU A&B, COVID) ARPGX2
Influenza A by PCR: NEGATIVE
Influenza A by PCR: NEGATIVE
Influenza B by PCR: NEGATIVE
Influenza B by PCR: NEGATIVE
SARS Coronavirus 2 by RT PCR: NEGATIVE
SARS Coronavirus 2 by RT PCR: NEGATIVE

## 2021-04-21 LAB — ACETAMINOPHEN LEVEL: Acetaminophen (Tylenol), Serum: <10 ug/mL — ABNORMAL LOW (ref 10–30)

## 2021-04-21 LAB — SALICYLATE LEVEL: Salicylate Lvl: <7 mg/dL — ABNORMAL LOW (ref 7.0–30.0)

## 2021-04-21 LAB — ETHANOL: Alcohol, Ethyl (B): <10 mg/dL (ref <10)

## 2021-04-21 MED ORDER — ZIPRASIDONE MESYLATE 20 MG IM SOLR
20.0000 mg | Freq: Once | INTRAMUSCULAR | Status: AC
  Administered 2021-04-21: 20 mg via INTRAMUSCULAR
  Filled 2021-04-21: qty 20

## 2021-04-21 MED ORDER — DIVALPROEX SODIUM ER 500 MG PO TB24
500.0000 mg | ORAL_TABLET | Freq: Three times a day (TID) | ORAL | Status: DC
  Administered 2021-04-21: 500 mg via ORAL
  Filled 2021-04-21: qty 1

## 2021-04-21 MED ORDER — LORAZEPAM 2 MG/ML IJ SOLN: 2.0000 mg | INTRAMUSCULAR | Status: DC | PRN

## 2021-04-21 MED ORDER — DIPHENHYDRAMINE HCL 50 MG/ML IJ SOLN: 50.0000 mg | Freq: Four times a day (QID) | INTRAMUSCULAR | Status: DC | PRN

## 2021-04-21 NOTE — ED Notes (Signed)
Pt declines medication. States he will "be cool" and rest himself, states if attempt to give meds "will have shit roll". Pt is cooperative at this time.  ?

## 2021-04-21 NOTE — ED Triage Notes (Signed)
Pt brought in by [facility] for threatening to kill his cousin. Per IVC papers pt is experiencing paranoid thinking. Per pt he is having family issues.  ?

## 2021-04-21 NOTE — ED Notes (Signed)
Pt dressing for discharge.  

## 2021-04-21 NOTE — BH Assessment (Signed)
Comprehensive Clinical Assessment (CCA) Note ? ?04/21/2021 ?[name] ?[id number] ?Recommendations for Services/Supports/Treatments: Pt pending psych consult/disposition. ? ?Kenneth Jensen. [name] is a 31 year old, English speaking, Black male with a hx of schizoaffective disorder, bipolar type. Pt also has a hx of cannabis use d/o, moderate use. Pt presented to [facility] ED under IVC via [facility] for making threats to kill his cousin. Pt was standing in the doorway upon this writer's arrival. Pt presented with tangential speech and had difficulty answering questions coherently. Pt was distractible and showed some paranoid thinking. Pt perseverated about people wanting him to take his last breath. Pt had a neglected appearance. Pt was noted to be responding to internal stimuli and endorsed experiencing visual hallucinations. Pt had poor concentration and was highly distractible. Pt's mood was labile and his affect was blunted. Pt was overconfident and religiously preoccupied. Pt reported that he takes his psych meds, however he skips doses stating, ?I would have done something if I'd taken that medicine?. Pt described his appetite and sleep as fair. Pt had poor insight and impaired judgement. The patient endorsed current thoughts of passive SI and HI due to ongoing conflict between he and his family.  ? ?Chief Complaint: No chief complaint on file. ? ?Visit Diagnosis: Schizoaffective disorder, bipolar type  ? ? ?CCA Screening, Triage and Referral (STR) ? ?Patient Reported Information ?How did you hear about [place]? Other (Comment) ([facility] Child psychotherapist) ? ?Referral name: Self ? ?Referral phone number: No data recorded ? ?Whom do you see for routine medical problems? I don't have a doctor ? ?Practice/Facility Name: No data recorded ?Practice/Facility Phone Number: No data recorded ?Name of Contact: No data recorded ?Contact Number: No data recorded ?Contact Fax Number: No data recorded ?Prescriber Name: No data  recorded ?Prescriber Address (if known): No data recorded ? ?What Is the Reason for Your Visit/Call Today? Pt presents under IVC via [employer] due to threatening to kill his cousin. ? ?How Long Has This Been Causing You Problems? 1 wk - 1 month ? ?What Do You Feel Would Help You the Most Today? Treatment for Depression or other mood problem; Medication(s) ? ? ?Have You Recently Been in Any Inpatient Treatment (Hospital/Detox/Crisis Center/28-Day Program)? No ? ?Name/Location of Program/Hospital:No data recorded ?How Long Were You There? No data recorded ?When Were You Discharged? No data recorded ? ?Have You Ever Received Services From [employer] [facility] [facility] Before? Yes ? ?Who Do You See at [facility]? Inpatient treatment ? ? ?Have You Recently Had Any Thoughts About Hurting Yourself? -- (Pt admits that he is suicidal sometimes) ? ?Are You Planning to Commit Suicide/Harm Yourself At This time? No ? ? ?Have you Recently Had Thoughts About Hurting Someone [name]? Yes ? ?Explanation: No data recorded ? ?Have You Used Any Alcohol or Drugs in the Past 24 Hours? No ? ?How Long Ago Did You Use Drugs or Alcohol? No data recorded ?What Did You Use and How Much? Pt denies use ? ? ?Do You Currently Have a Therapist/Psychiatrist? Yes ? ?Name of Therapist/Psychiatrist: [facility]T Team ? ? ?Have You Been Recently Discharged From Any Office Practice or Programs? No ? ?Explanation of Discharge From Practice/Program: No data recorded ? ?  ?CCA Screening Triage Referral Assessment ?Type of Contact: Face-to-Face ? ?Is this Initial or Reassessment? No data recorded ?Date Telepsych consult ordered in CHL:  No data recorded ?Time Telepsych consult ordered in CHL:  No data recorded ? ?Patient Reported Information Reviewed? Yes ? ?Patient Left Without Being Seen? No data  recorded ?Reason for Not Completing Assessment: No data recorded ? ?Collateral Involvement: n/a ? ? ?Does Patient Have a Automotive engineer Guardian? No data recorded ?Name and Contact of  Legal Guardian: No data recorded ?If Minor and Not Living with Parent(s), Who has Custody? n/a ? ?Is CPS involved or ever been involved? Never ? ?Is APS involved or ever been involved? Never ? ? ?Patient Determined To Be At Risk for Harm To Self or Others Based on Review of Patient Reported Information or Presenting Complaint? Yes, for Harm to Others ? ?Method: Plan with intent and identified person ? ?Availability of Means: No access or NA ? ?Intent: Vague intent or NA ? ?Notification Required: Identifiable person is aware ? ?Additional Information for Danger to Others Potential: Active psychosis ? ?Additional Comments for Danger to Others Potential: n/a ? ?Are There Guns or Other Weapons in Your Home? No ? ?Types of Guns/Weapons: No data recorded ?Are These Weapons Safely Secured?                            No data recorded ?Who Could Verify You Are Able To Have These Secured: No data recorded ?Do You Have any Outstanding Charges, Pending Court Dates, Parole/Probation? None ? ?Contacted To Inform of Risk of Harm To Self or Others: Unable to Contact: ? ? ?Location of Assessment: [facility] ED ? ? ?Does Patient Present under Involuntary Commitment? Yes ? ?IVC Papers Initial File Date: 04/21/21 ? ? ?[place] of Residence: [place] ? ? ?Patient Currently Receiving the Following Services: ACTT (Assertive Community Treatment) ? ? ?Determination of Need: Emergent (2 hours) ? ? ?Options For Referral: Therapeutic Triage Services; ED Visit ? ? ? ? ?CCA Biopsychosocial ?Intake/Chief Complaint:  Patient is presenting to [facility] ED voluntarily due to experiencing AH, patient is presenting paranoid with persecutory delusions ? ?Current Symptoms/Problems: Patient is presenting to [facility] ED voluntarily due to experiencing AH, pt was recently discharged from the hospital. ? ? ?Patient Reported Schizophrenia/Schizoaffective Diagnosis in Past: Yes ? ? ?Strengths: Pt has secure housing, medication comliance, and agency  involvement. ? ?Preferences: Unknown ? ?Abilities: Pt able to take care of himself ? ? ?Type of Services Patient Feels are Needed: Inpatient treatment ? ? ?Initial Clinical Notes/Concerns: None ? ? ?Mental Health Symptoms ?Depression:   ?Worthlessness; Hopelessness; Irritability; Difficulty Concentrating ?  ?Duration of Depressive symptoms:  ?Greater than two weeks ?  ?Mania:   ?Irritability; Racing thoughts; Overconfidence ?  ?Anxiety:    ?Irritability; Tension; Worrying; Difficulty concentrating ?  ?Psychosis:   ?Delusions; Hallucinations ?  ?Duration of Psychotic symptoms:  ?Greater than six months ?  ?Trauma:   ?N/A ?  ?Obsessions:   ?Cause anxiety; Disrupts routine/functioning; Poor insight; Intrusive/time consuming; Recurrent & persistent thoughts/impulses/images ?  ?Compulsions:   ?None ?  ?Inattention:   ?N/A ?  ?Hyperactivity/Impulsivity:   ?N/A ?  ?Oppositional/Defiant Behaviors:   ?Angry; Resentful ?  ?Emotional Irregularity:   ?Unstable self-image; Mood lability ?  ?Other Mood/Personality Symptoms:  No data recorded  ? ?Mental Status Exam ?Appearance and self-care  ?Stature:   ?Average ?  ?Weight:   ?Average weight ?  ?Clothing:   ?Casual ?  ?Grooming:   ?Neglected ?  ?Cosmetic use:   ?None ?  ?Posture/gait:   ?Normal ?  ?Motor activity:   ?Not Remarkable ?  ?Sensorium  ?Attention:   ?Distractible ?  ?Concentration:   ?Scattered; Preoccupied; Focuses on irrelevancies; Anxiety interferes ?  ?Orientation:   ?  Place; Situation; Person; Object ?  ?Recall/memory:   ?Normal ?  ?Affect and Mood  ?Affect:   ?Labile ?  ?Mood:   ?Irritable; Hopeless ?  ?Relating  ?Eye contact:   ?Avoided ?  ?Facial expression:   ?Tense ?  ?Attitude toward examiner:   ?Irritable ?  ?Thought and Language  ?Speech flow:  ?Pressured ?  ?Thought content:   ?Delusions ?  ?Preoccupation:   ?Ruminations ?  ?Hallucinations:   ?Auditory ?  ?Organization:  No data recorded  ?Executive Functions  ?Fund of Knowledge:   ?Fair ?  ?Intelligence:    ?Average ?  ?Abstraction:   ?Overly abstract ?  ?Judgement:   ?Fair ?  ?Reality Testing:   ?Adequate ?  ?Insight:   ?Fair ?  ?Decision Making:   ?Only simple ?  ?Social Functioning  ?Social Maturity:   ?Self-centered ?  ?Social Judgement:

## 2021-04-21 NOTE — Consult Note (Signed)
Client continues to be stable with no suicidal/homicidal ideations, hallucinations, or substance use. ? ?Nanine Means, PMHNP ?

## 2021-04-21 NOTE — ED Notes (Signed)
Pt discharged home.  VS stable.  All belongings (cell phone, wallet, keys) returned to patient. Pt denies SI/HI/AVH.  ?

## 2021-04-21 NOTE — Consult Note (Signed)
[facility] Face-to-Face Psychiatry Consult  ? ?Reason for Consult:  Psych evaluation ?Referring Physician:  Dr. Scotty Court ?Patient Identification: Kenneth Jensen ?MRN:  [id number] ?Principal Diagnosis: <principal problem not specified> ?Diagnosis:  Active Problems: ?  Schizoaffective disorder, bipolar type (HCC) ? ? ?Total Time spent with patient: 45 minutes ? ?Subjective:   ?"I dont feel like myself" ? ?HPI:  Kenneth Jensen, 31 y.o., male patient presents to this ER with a hx of schizoaffective disorder, bipolar type. Patient was seen by this provider; chart reviewed and consulted with Dr. Scotty Court on 04/21/21.  On evaluation Kenneth Jensen reports that he presents to the er because "he has not been feeling like himself".  He says he stopped taking his oral meds because they "make me feel funny".  He states that he is up to date on his LAI which administered by his actt team.  At this time, he denies SI/HI/AVH.  He says he mostly here for a "good nights rest".  He states that he will continue to follow-up with his outside provider for his mental health needs in terms of medication adjustments etc.  At this time, this provider sees no reason for patient to require inpatient psychiatric care. He currently poses no risk to him self or others.  He is eating and sleeping appropriately (although his preference is to sleep in the hospital).   ? ?Patient is speaking in a clear tone at moderate volume, and normal pace; with fair eye contact. His thought process is coherent and relevant; There is no indication that he is currently responding to internal/external stimuli or experiencing delusional thought content.  Patient denies suicidal/self-harm/homicidal ideation, psychosis, and paranoia.  Patient has remained calm throughout assessment and has answered questions appropriately.  ? ? ? ?Disposition:  Patient psychiatrically cleared ? ? ?Past Psychiatric History: schizoaffective disorder, bipolar type ? ?Risk to  Self:   ?Risk to Others:   ?Prior Inpatient Therapy:   ?Prior Outpatient Therapy:   ? ?Past Medical History:  ?Past Medical History:  ?Diagnosis Date  ? Asthma   ? Bipolar 1 disorder (HCC)   ? Depression   ? Schizophrenia (HCC)   ? Schizotaxia   ? No past surgical history on file. ?Family History: No family history on file. ?Family Psychiatric  History: unknown ?Social History:  ?Social History  ? ?Substance and Sexual Activity  ?Alcohol Use Yes  ? Comment: social  ?   ?Social History  ? ?Substance and Sexual Activity  ?Drug Use Yes  ? Types: Marijuana  ?  ?Social History  ? ?Socioeconomic History  ? Marital status: Single  ?  Spouse name: Not on file  ? Number of children: Not on file  ? Years of education: Not on file  ? Highest education level: Not on file  ?Occupational History  ? Not on file  ?Tobacco Use  ? Smoking status: Every Day  ? Smokeless tobacco: Never  ?Substance and Sexual Activity  ? Alcohol use: Yes  ?  Comment: social  ? Drug use: Yes  ?  Types: Marijuana  ? Sexual activity: Not on file  ?Other Topics Concern  ? Not on file  ?Social History Narrative  ? ** Merged History Encounter **  ?    ? ?Social Determinants of Health  ? ?Financial Resource Strain: Not on file  ?Food Insecurity: Not on file  ?Transportation Needs: Not on file  ?Physical Activity: Not on file  ?Stress: Not on file  ?Social Connections: Not on file  ? ?  Additional Social History: ?  ? ?Allergies:   ?Allergies  ?Allergen Reactions  ? Amoxicillin Swelling  ? Haldol [Haloperidol]   ?  Pt states "I went crazy"  ? Percocet [Oxycodone-Acetaminophen]   ? Vicodin [Hydrocodone-Acetaminophen] Itching  ? Vicodin [Hydrocodone-Acetaminophen]   ? ? ?Labs:  ?Results for orders placed or performed during the hospital encounter of 04/20/21 (from the past 48 hour(s))  ?Comprehensive metabolic panel     Status: Abnormal  ? Collection Time: 04/20/21 11:20 PM  ?Result Value Ref Range  ? Sodium 137 135 - 145 mmol/L  ? Potassium 3.6 3.5 - 5.1 mmol/L  ?  Chloride 100 98 - 111 mmol/L  ? CO2 27 22 - 32 mmol/L  ? Glucose, Bld 120 (H) 70 - 99 mg/dL  ?  Comment: Glucose reference range applies only to samples taken after fasting for at least 8 hours.  ? BUN 11 6 - 20 mg/dL  ? Creatinine, Ser 0.82 0.61 - 1.24 mg/dL  ? Calcium 8.9 8.9 - 10.3 mg/dL  ? Total Protein 7.5 6.5 - 8.1 g/dL  ? Albumin 4.3 3.5 - 5.0 g/dL  ? AST 24 15 - 41 U/L  ? ALT 12 0 - 44 U/L  ? Alkaline Phosphatase 59 38 - 126 U/L  ? Total Bilirubin 0.4 0.3 - 1.2 mg/dL  ? GFR, Estimated >60 >60 mL/min  ?  Comment: (NOTE) ?Calculated using the CKD-EPI Creatinine Equation (2021) ?  ? Anion gap 10 5 - 15  ?  Comment: Performed at [facility], [address], [place], [place] [address]  ?Ethanol     Status: None  ? Collection Time: 04/20/21 11:20 PM  ?Result Value Ref Range  ? Alcohol, Ethyl (B) <10 <10 mg/dL  ?  Comment: (NOTE) ?Lowest detectable limit for serum alcohol is 10 mg/dL. ? ?For medical purposes only. ?Performed at [facility], [address] [place], ?[place] [address] ?  ?cbc     Status: Abnormal  ? Collection Time: 04/20/21 11:20 PM  ?Result Value Ref Range  ? WBC 10.6 (H) 4.0 - 10.5 K/uL  ? RBC 4.81 4.22 - 5.81 MIL/uL  ? Hemoglobin 14.0 13.0 - 17.0 g/dL  ? HCT 41.9 39.0 - 52.0 %  ? MCV 87.1 80.0 - 100.0 fL  ? MCH 29.1 26.0 - 34.0 pg  ? MCHC 33.4 30.0 - 36.0 g/dL  ? RDW 13.6 11.5 - 15.5 %  ? Platelets 347 150 - 400 K/uL  ? nRBC 0.0 0.0 - 0.2 %  ?  Comment: Performed at [facility], [address], [place], [place] [address]  ? ? ?Current Facility-Administered Medications  ?Medication Dose Route Frequency Provider Last Rate Last Admin  ? alum & mag hydroxide-simeth (MAALOX/MYLANTA) 200-200-20 MG/5ML suspension 30 mL  30 mL Oral Q6H PRN Sharman Cheek, MD      ? ibuprofen (ADVIL) tablet 600 mg  600 mg Oral Q8H PRN Sharman Cheek, MD      ? nicotine (NICODERM CQ - dosed in mg/24 hours) patch 21 mg  21 mg Transdermal Daily PRN Sharman Cheek, MD      ? ?Current  Outpatient Medications  ?Medication Sig Dispense Refill  ? divalproex (DEPAKOTE ER) 500 MG 24 hr tablet Take 1 tablet (500 mg total) by mouth 3 (three) times daily. (Patient not taking: Reported on 03/14/2021) 90 tablet 1  ? paliperidone (INVEGA SUSTENNA) 234 MG/1.5ML SUSY injection Inject 234 mg into the muscle every 30 (thirty) days.    ? ? ?  Musculoskeletal: ?Strength & Muscle Tone: within normal limits ?Gait & Station: normal ?Patient leans: N/A ? ?Psychiatric Specialty Exam: ? ?Presentation  ?General Appearance: Fairly Groomed ? ?Eye Contact:Fair ? ?Speech:Clear and Coherent ? ?Speech Volume:Decreased ? ?Handedness:Right ? ? ?Mood and Affect  ?Mood:Irritable ? ?Affect:Flat ? ? ?Thought Process  ?Thought Processes:Coherent ? ?Descriptions of Associations:Intact ? ?Orientation:Full (Time, Place and Person) ? ?Thought Content:Logical ? ?History of Schizophrenia/Schizoaffective disorder:Yes ? ?Duration of Psychotic Symptoms:Greater than six months ? ?Hallucinations:Hallucinations: None ? ?Ideas of Reference:None ? ?Suicidal Thoughts:Suicidal Thoughts: No ? ?Homicidal Thoughts:Homicidal Thoughts: No ? ? ?Sensorium  ?Memory:Immediate Fair ? ?Judgment:Fair ? ?Insight:Fair ? ? ?Executive Functions  ?Concentration:Fair ? ?Attention Span:Fair ? ?Recall:Fair ? ?Fund of Knowledge:Fair ? ?Language:Fair ? ? ?Psychomotor Activity  ?Psychomotor Activity:Psychomotor Activity: Normal ? ? ?Assets  ?Assets:Communication Skills; Desire for Improvement; Housing; Social Support; Vocational/Educational ? ? ?Sleep  ?Sleep:Sleep: Fair ? ? ?Physical Exam: ?Physical Exam ?Vitals and nursing note reviewed.  ?HENT:  ?   Head: Normocephalic and atraumatic.  ?   Nose: Nose normal.  ?   Mouth/Throat:  ?   Mouth: Mucous membranes are dry.  ?Eyes:  ?   Pupils: Pupils are equal, round, and reactive to light.  ?Cardiovascular:  ?   Pulses: Normal pulses.  ?Pulmonary:  ?   Effort: Pulmonary effort is normal.  ?Musculoskeletal:     ?   General: Normal  range of motion.  ?   Cervical back: Normal range of motion.  ?Skin: ?   General: Skin is warm and dry.  ?Neurological:  ?   General: No focal deficit present.  ?   Mental Status: He is alert and orie

## 2021-04-21 NOTE — BH Assessment (Signed)
Referral information for Psychiatric Hospitalization faxed to: ? ?[facility] [phone]) ? ?[facility] [facility] [phone]),  ? ?[facility] [phone] -[phone]),  ? ?

## 2021-04-21 NOTE — ED Notes (Addendum)
Pt belongings: ?Phone, charger, keys, $3.30, matchbook , black wallet, gray tank top, black sneakers, blue and gray pants, non slip socks, pair of underwear, multi color jacket, placed in 2 bags labeled with pt's name.  ?Pt given hospital scrubs and mesh underwear and non slip socks to wear,  ?

## 2021-04-21 NOTE — ED Provider Notes (Signed)
Emergency Medicine Observation Re-evaluation Note ? ?Kenneth Jensen is a 31 y.o. male, seen on rounds today. ? ?Physical Exam  ?BP 130/79 (BP Location: Left Arm)   Pulse 81   Temp 98.3 ?F (36.8 ?C) (Oral)   Resp 18   Ht 5\' 5"  (1.651 m)   Wt 90.7 kg   SpO2 97%   BMI 33.28 kg/m?  ?Physical Exam ?General: Patient resting comfortably in bed ?Lungs: Patient in no respiratory distress ?Psych: Patient not combative ? ?ED Course / MDM  ?EKG:  ? ? ? ?Plan  ?Current plan is for psychiatric evaluation for his apparent hallucinations. ? Kenneth Jensen is not under involuntary commitment. ? ? ?  ?Reyne Dumas, MD ?04/21/21 0730 ? ?

## 2021-04-21 NOTE — ED Notes (Signed)
Pt up to restroom.

## 2021-04-21 NOTE — ED Notes (Signed)
Report to dawn, rn.  

## 2021-04-21 NOTE — Discharge Instructions (Addendum)
Please follow-up with your ACT team.  Continue your medicines.  Return as needed. ?

## 2021-04-21 NOTE — ED Provider Notes (Signed)
? ?[facility] ?Provider Note ? ? Event Date/Time  ? First MD Initiated Contact with Patient 04/21/21 1946   ?  (approximate) ?History  ?No chief complaint on file. ? ?HPI ?Kenneth Jensen is a 31 y.o. male with a past medical history of schizoaffective disorder with bipolar type I who presents under involuntary commitment after making threatening statements about killing a cousin as well as responding to internal stimuli concerning for acute psychosis.  Patient refusing interview at this time and will keep his blanket over his face when attempting to ask questions.  Further history and review of systems unable to be assessed at this time ?Physical Exam  ?Triage Vital Signs: ?ED Triage Vitals  ?Enc Vitals Group  ?   BP 04/21/21 1927 120/89  ?   Pulse Rate 04/21/21 1927 77  ?   Resp 04/21/21 1927 18  ?   Temp 04/21/21 1927 98.2 ?F (36.8 ?C)  ?   Temp Source 04/21/21 1927 Oral  ?   SpO2 04/21/21 1927 97 %  ?   Weight 04/21/21 1928 200 lb (90.7 kg)  ?   Height 04/21/21 1928 5\' 5"  (1.651 m)  ?   Head Circumference --   ?   Peak Flow --   ?   Pain Score 04/21/21 1928 0  ?   Pain Loc --   ?   Pain Edu? --   ?   Excl. in GC? --   ? ?Most recent vital signs: ?Vitals:  ? 04/21/21 1927  ?BP: 120/89  ?Pulse: 77  ?Resp: 18  ?Temp: 98.2 ?F (36.8 ?C)  ?SpO2: 97%  ? ?General: Awake, alert ?CV:  Good peripheral perfusion.  ?Resp:  Normal effort.  ?Abd:  No distention.  ?Other:  Middle-aged overweight African-American male laying in bed with sheet over his face refusing to answer questions ?ED Results / Procedures / Treatments  ?Labs ?(all labs ordered are listed, but only abnormal results are displayed) ?Labs Reviewed  ?COMPREHENSIVE METABOLIC PANEL - Abnormal; Notable for the following components:  ?    Result Value  ? Glucose, Bld 121 (*)   ? Calcium 8.8 (*)   ? All other components within normal limits  ?SALICYLATE LEVEL - Abnormal; Notable for the following components:  ? Salicylate Lvl <7.0 (*)   ?  All other components within normal limits  ?ACETAMINOPHEN LEVEL - Abnormal; Notable for the following components:  ? Acetaminophen (Tylenol), Serum <10 (*)   ? All other components within normal limits  ?URINE DRUG SCREEN, QUALITATIVE ([facility] ONLY) - Abnormal; Notable for the following components:  ? Cannabinoid 50 Ng, Ur [place] POSITIVE (*)   ? All other components within normal limits  ?RESP PANEL BY RT-PCR (FLU A&B, COVID) ARPGX2  ?ETHANOL  ?CBC  ? ?PROCEDURES: ?Critical Care performed: No ?Procedures ?MEDICATIONS ORDERED IN ED: ?Medications  ?ziprasidone (GEODON) injection 20 mg (has no administration in time range)  ?LORazepam (ATIVAN) injection 2 mg (has no administration in time range)  ?diphenhydrAMINE (BENADRYL) injection 50 mg (has no administration in time range)  ? ?IMPRESSION / MDM / ASSESSMENT AND PLAN / ED COURSE  ?I reviewed the triage vital signs and the nursing notes. ?             ?               ? ?Patient presents under IVC for hallucinations/delusions. Thoughts are disorganized. ?No history of prior suicide attempt, and no SI at this time. ?Clinically  w/ no overt toxidrome, low suspicion for ingestion given hx and exam Thoughts unlikely 2/2 anemia, hypothyroidism, infection, or ICH. ?Patient?s decision making capacity is compromised and they are unable to perform all ADL?s (additionally they are without appropriate caretakers to assist through this deficit). ? ?Consult: Psychiatry to evaluate patient for grave disability ?Disposition: Pending psychiatric evaluation ? ?Care of this patient will be signed out the oncoming physician.  All pertinent patient formation is conveyed and all questions answered.  All further care and disposition decisions will be made by the oncoming physician. ? ?  ?FINAL CLINICAL IMPRESSION(S) / ED DIAGNOSES  ? ?Final diagnoses:  ?Psychosis, unspecified psychosis type (HCC)  ?Homicidal ideation  ? ?Rx / DC Orders  ? ?ED Discharge Orders   ? ? None  ? ?  ? ?Note:  This  document was prepared using Dragon voice recognition software and may include unintentional dictation errors. ?  ?Merwyn Katos, MD ?04/21/21 2102 ? ?

## 2021-04-22 ENCOUNTER — Other Ambulatory Visit: Payer: Self-pay

## 2021-04-22 ENCOUNTER — Inpatient Hospital Stay
Admission: AD | Admit: 2021-04-22 | Discharge: 2021-04-25 | DRG: 885 | Disposition: A | Discharge status: Home / Self Care | Payer: Medicare HMO | Source: Intra-hospital | Attending: Psychiatry | Admitting: Psychiatry

## 2021-04-22 ENCOUNTER — Encounter: Payer: Self-pay | Admitting: Psychiatry

## 2021-04-22 DIAGNOSIS — F17210 Nicotine dependence, cigarettes, uncomplicated: Secondary | ICD-10-CM | POA: Diagnosis present

## 2021-04-22 DIAGNOSIS — F259 Schizoaffective disorder, unspecified: Principal | ICD-10-CM | POA: Diagnosis present

## 2021-04-22 DIAGNOSIS — R7303 Prediabetes: Secondary | ICD-10-CM | POA: Diagnosis not present

## 2021-04-22 DIAGNOSIS — F250 Schizoaffective disorder, bipolar type: Secondary | ICD-10-CM

## 2021-04-22 DIAGNOSIS — F29 Unspecified psychosis not due to a substance or known physiological condition: Secondary | ICD-10-CM | POA: Diagnosis not present

## 2021-04-22 DIAGNOSIS — R45850 Homicidal ideations: Secondary | ICD-10-CM | POA: Diagnosis not present

## 2021-04-22 DIAGNOSIS — F1220 Cannabis dependence, uncomplicated: Secondary | ICD-10-CM | POA: Diagnosis not present

## 2021-04-22 DIAGNOSIS — Z0184 Encounter for antibody response examination: Secondary | ICD-10-CM | POA: Diagnosis not present

## 2021-04-22 DIAGNOSIS — Z20822 Contact with and (suspected) exposure to covid-19: Secondary | ICD-10-CM | POA: Diagnosis not present

## 2021-04-22 MED ORDER — MAGNESIUM HYDROXIDE 400 MG/5ML PO SUSP
30.0000 mL | Freq: Every day | ORAL | Status: DC | PRN
Start: 2021-04-22 — End: 2021-04-25

## 2021-04-22 MED ORDER — QUETIAPINE FUMARATE 200 MG PO TABS
200.0000 mg | ORAL_TABLET | Freq: Every day | ORAL | Status: DC
  Administered 2021-04-22 – 2021-04-24 (×3): 200 mg via ORAL
  Filled 2021-04-22 (×3): qty 1

## 2021-04-22 MED ORDER — ALUM & MAG HYDROXIDE-SIMETH 200-200-20 MG/5ML PO SUSP: 30.0000 mL | ORAL | Status: DC | PRN

## 2021-04-22 MED ORDER — NICOTINE 14 MG/24HR TD PT24
14.0000 mg | MEDICATED_PATCH | Freq: Every day | TRANSDERMAL | Status: DC
  Administered 2021-04-23: 14 mg via TRANSDERMAL
  Filled 2021-04-22 (×3): qty 1

## 2021-04-22 MED ORDER — OLANZAPINE 5 MG PO TBDP
10.0000 mg | ORAL_TABLET | Freq: Once | ORAL | Status: AC
  Administered 2021-04-22: 10 mg via ORAL
  Filled 2021-04-22: qty 2

## 2021-04-22 MED ORDER — DIVALPROEX SODIUM ER 500 MG PO TB24
500.0000 mg | ORAL_TABLET | Freq: Three times a day (TID) | ORAL | Status: DC
  Administered 2021-04-22 – 2021-04-25 (×8): 500 mg via ORAL
  Filled 2021-04-22 (×8): qty 1

## 2021-04-22 NOTE — ED Notes (Signed)
EDP at bedside. ? ?Encouraged patient to tidy room, provided trash can for patient to throw away any trash in patient room with staff supervision. ? ?

## 2021-04-22 NOTE — Tx Team (Signed)
Initial Treatment Plan ?04/22/2021 ?1:25 PM ?Diona Foley ?[id number] ? ? ? ?PATIENT STRESSORS: ?Marital or family conflict   ?Medication change or noncompliance   ? ? ?PATIENT STRENGTHS: ?Motivation for treatment/growth  ?Physical Health  ? ? ?PATIENT IDENTIFIED PROBLEMS: ?Ineffective coping skills  ?Non medication compliant  ?Substance Abuse  ?  ?  ?  ?  ?  ?  ?  ? ?DISCHARGE CRITERIA:  ?Improved stabilization in mood, thinking, and/or behavior ?Motivation to continue treatment in a less acute level of care ? ?PRELIMINARY DISCHARGE PLAN: ?Outpatient therapy ?Return to previous living arrangement ? ?PATIENT/FAMILY INVOLVEMENT: ?This treatment plan has been presented to and reviewed with the patient, Kenneth Jensen, and/or family member.  The patient and family have been given the opportunity to ask questions and make suggestions. ? ?Aleen Sells, RN ?04/22/2021, 1:25 PM ?

## 2021-04-22 NOTE — Group Note (Signed)
[facility] LCSW Group Therapy Note ? ? ? ?Group Date: 04/22/2021 ?Start Time: 1300 ?End Time: 1400 ? ?Type of Therapy and Topic:  Group Therapy:  Overcoming Obstacles ? ?Participation Level:  [facility] PARTICIPATION LEVEL: Did Not Attend ? ?Mood: ? ?Description of Group:   ?In this group patients will be encouraged to explore what they see as obstacles to their own wellness and recovery. They will be guided to discuss their thoughts, feelings, and behaviors related to these obstacles. The group will process together ways to cope with barriers, with attention given to specific choices patients can make. Each patient will be challenged to identify changes they are motivated to make in order to overcome their obstacles. This group will be process-oriented, with patients participating in exploration of their own experiences as well as giving and receiving support and challenge from other group members. ? ?Therapeutic Goals: ?1. Patient will identify personal and current obstacles as they relate to admission. ?2. Patient will identify barriers that currently interfere with their wellness or overcoming obstacles.  ?3. Patient will identify feelings, thought process and behaviors related to these barriers. ?4. Patient will identify two changes they are willing to make to overcome these obstacles:  ? ? ?Summary of Patient Progress ? ? ?X ? ? ?Therapeutic Modalities:   ?Cognitive Behavioral Therapy ?Solution Focused Therapy ?Motivational Interviewing ?Relapse Prevention Therapy ? ? ?Harden Mo, LCSW ?
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
## 2021-04-22 NOTE — ED Provider Notes (Signed)
Emergency Medicine Observation Re-evaluation Note [name] is a 31 y.o. male, seen on rounds today.  Pt initially presented to the ED for complaints of No chief complaint on file. ?Currently, the patient is resting. ? ?Physical Exam  ?BP 120/89 (BP Location: Left Arm)   Pulse 77   Temp 98.2 ?F (36.8 ?C) (Oral)   Resp 18   Ht 1.651 m (5\' 5" )   Wt 90.7 kg   SpO2 97%   BMI 33.28 kg/m?  ?Physical Exam ?Gen:  No acute distress ?Resp:  Breathing easily and comfortably, no accessory muscle usage ?Neuro:  Moving all four extremities, no gross focal neuro deficits ?Psych:  Resting currently, calm when awake ? ? ?ED Course / MDM  ?EKG:  ? ?I have reviewed the labs performed to date as well as medications administered while in observation.  Recent changes in the last 24 hours include initial evaluation by EDP and evaluation by psychiatry. ? ?Plan  ?Current plan is for psychiatric placement. ?Kenneth Jensen is under involuntary commitment. ?  ? ?  ?Reyne Dumas, MD ?04/22/21 [phone] ? ?

## 2021-04-22 NOTE — ED Notes (Signed)
Report to [facility], Charity fundraiser. Pt to go [phone] in BMU ?

## 2021-04-22 NOTE — ED Notes (Signed)
Taken to [employer] via Nutritional therapist and Security in wheelchair. 2 bags of belongings. ?
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
## 2021-04-22 NOTE — BH Assessment (Signed)
Patient is to be admitted to [facility] BMU today 04/22/21 after 2:00pm by Dr. Toni Amend.  ?Attending Physician will be Dr.  Toni Amend .   ?Patient has been assigned to room 323, by [facility] Charge Nurse, Ivonne Andrew.   ? ?ER staff is aware of the admission: ?Fleet Contras, ER Secretary   ?Dr. Erma Heritage, ER MD  ?Connye Burkitt, Patient's Nurse  ?Sue Lush, Patient Access.  ?

## 2021-04-22 NOTE — Plan of Care (Signed)
?  Problem: Education: ?Goal: Knowledge of [facility] Education information/materials will improve ?Outcome: Not Progressing ?Goal: Emotional status will improve ?Outcome: Not Progressing ?Goal: Mental status will improve ?Outcome: Not Progressing ?Goal: Verbalization of understanding the information provided will improve ?Outcome: Not Progressing ?  ?Problem: Activity: ?Goal: Interest or engagement in activities will improve ?Outcome: Not Progressing ?Goal: Sleeping patterns will improve ?Outcome: Not Progressing ?  ?Problem: Coping: ?Goal: Ability to verbalize frustrations and anger appropriately will improve ?Outcome: Not Progressing ?Goal: Ability to demonstrate self-control will improve ?Outcome: Not Progressing ?  ?Problem: Health Behavior/Discharge Planning: ?Goal: Identification of resources available to assist in meeting health care needs will improve ?Outcome: Not Progressing ?Goal: Compliance with treatment plan for underlying cause of condition will improve ?Outcome: Not Progressing ?  ?Problem: Physical Regulation: ?Goal: Ability to maintain clinical measurements within normal limits will improve ?Outcome: Not Progressing ?  ?Problem: Safety: ?Goal: Periods of time without injury will increase ?Outcome: Not Progressing ?  ?Problem: Activity: ?Goal: Will verbalize the importance of balancing activity with adequate rest periods ?Outcome: Not Progressing ?  ?Problem: Education: ?Goal: Will be free of psychotic symptoms ?Outcome: Not Progressing ?Goal: Knowledge of the prescribed therapeutic regimen will improve ?Outcome: Not Progressing ?  ?Problem: Coping: ?Goal: Coping ability will improve ?Outcome: Not Progressing ?Goal: Will verbalize feelings ?Outcome: Not Progressing ?  ?Problem: Health Behavior/Discharge Planning: ?Goal: Compliance with prescribed medication regimen will improve ?Outcome: Not Progressing ?  ?Problem: Nutritional: ?Goal: Ability to achieve adequate nutritional intake will  improve ?Outcome: Not Progressing ?  ?Problem: Role Relationship: ?Goal: Ability to communicate needs accurately will improve ?Outcome: Not Progressing ?Goal: Ability to interact with others will improve ?Outcome: Not Progressing ?  ?Problem: Safety: ?Goal: Ability to redirect hostility and anger into socially appropriate behaviors will improve ?Outcome: Not Progressing ?Goal: Ability to remain free from injury will improve ?Outcome: Not Progressing ?  ?Problem: Self-Care: ?Goal: Ability to participate in self-care as condition permits will improve ?Outcome: Not Progressing ?  ?Problem: Self-Concept: ?Goal: Will verbalize positive feelings about self ?Outcome: Not Progressing ?  ?Problem: Education: ?Goal: Ability to state activities that reduce stress will improve ?Outcome: Not Progressing ?  ?Problem: Coping: ?Goal: Ability to identify and develop effective coping behavior will improve ?Outcome: Not Progressing ?  ?Problem: Self-Concept: ?Goal: Ability to identify factors that promote anxiety will improve ?Outcome: Not Progressing ?Goal: Level of anxiety will decrease ?Outcome: Not Progressing ?Goal: Ability to modify response to factors that promote anxiety will improve ?Outcome: Not Progressing ?  ?

## 2021-04-22 NOTE — Consult Note (Signed)
[facility] Face-to-Face Psychiatry Consult  ? ?Reason for Consult:  paranoia ?Referring Physician:  EDP ?Patient Identification: Kenneth Jensen ?MRN:  [id number] ?Principal Diagnosis: Schizoaffective disorder, bipolar type (HCC) ?Diagnosis:  Principal Problem: ?  Schizoaffective disorder, bipolar type (HCC) ? ? ?Total Time spent with patient: 20 minutes ? ?Subjective:  "If you all mess up my back porch, I am suing." ?Kenneth Jensen is a 31 y.o. male patient admitted with paranoid thoughts. ? ?HPI:  Patient returned to ED via IVC after being discharged from ED less than 12 hr. Earlier,  Threatened to "kill his cousin." Paranoid thinking. On evaluation, patient makes good eye contact. Is aware that he just got some Zyprexa. He has not been aggressive this morning, but is somewhat irritable. Puts blanket over his head.  ? ?Past Psychiatric History: see previous ? ?Risk to Self:   ?Risk to Others:   ?Prior Inpatient Therapy:   ?Prior Outpatient Therapy:   ? ?Past Medical History:  ?Past Medical History:  ?Diagnosis Date  ? Asthma   ? Bipolar 1 disorder (HCC)   ? Depression   ? Schizophrenia (HCC)   ? Schizotaxia   ? History reviewed. No pertinent surgical history. ?Family History: History reviewed. No pertinent family history. ?Family Psychiatric  History: see previous ?Social History:  ?Social History  ? ?Substance and Sexual Activity  ?Alcohol Use Yes  ? Comment: social  ?   ?Social History  ? ?Substance and Sexual Activity  ?Drug Use Yes  ? Types: Marijuana  ?  ?Social History  ? ?Socioeconomic History  ? Marital status: Single  ?  Spouse name: Not on file  ? Number of children: Not on file  ? Years of education: Not on file  ? Highest education level: Not on file  ?Occupational History  ? Not on file  ?Tobacco Use  ? Smoking status: Every Day  ? Smokeless tobacco: Never  ?Substance and Sexual Activity  ? Alcohol use: Yes  ?  Comment: social  ? Drug use: Yes  ?  Types: Marijuana  ? Sexual activity: Not on file   ?Other Topics Concern  ? Not on file  ?Social History Narrative  ? ** Merged History Encounter **  ?    ? ?Social Determinants of Health  ? ?Financial Resource Strain: Not on file  ?Food Insecurity: Not on file  ?Transportation Needs: Not on file  ?Physical Activity: Not on file  ?Stress: Not on file  ?Social Connections: Not on file  ? ?Additional Social History: ?  ? ?Allergies:   ?Allergies  ?Allergen Reactions  ? Amoxicillin Swelling  ? Haldol [Haloperidol]   ?  Pt states "I went crazy"  ? Percocet [Oxycodone-Acetaminophen]   ? Vicodin [Hydrocodone-Acetaminophen] Itching  ? Vicodin [Hydrocodone-Acetaminophen]   ? ? ?Labs:  ?Results for orders placed or performed during the hospital encounter of 04/21/21 (from the past 48 hour(s))  ?Comprehensive metabolic panel     Status: Abnormal  ? Collection Time: 04/21/21  7:35 PM  ?Result Value Ref Range  ? Sodium 136 135 - 145 mmol/L  ? Potassium 4.0 3.5 - 5.1 mmol/L  ? Chloride 100 98 - 111 mmol/L  ? CO2 27 22 - 32 mmol/L  ? Glucose, Bld 121 (H) 70 - 99 mg/dL  ?  Comment: Glucose reference range applies only to samples taken after fasting for at least 8 hours.  ? BUN 10 6 - 20 mg/dL  ? Creatinine, Ser 0.78 0.61 - 1.24 mg/dL  ? Calcium  8.8 (L) 8.9 - 10.3 mg/dL  ? Total Protein 7.4 6.5 - 8.1 g/dL  ? Albumin 4.4 3.5 - 5.0 g/dL  ? AST 21 15 - 41 U/L  ? ALT 12 0 - 44 U/L  ? Alkaline Phosphatase 55 38 - 126 U/L  ? Total Bilirubin 0.4 0.3 - 1.2 mg/dL  ? GFR, Estimated >60 >60 mL/min  ?  Comment: (NOTE) ?Calculated using the CKD-EPI Creatinine Equation (2021) ?  ? Anion gap 9 5 - 15  ?  Comment: Performed at [facility], [address], [place], [place] [address]  ?Ethanol     Status: None  ? Collection Time: 04/21/21  7:35 PM  ?Result Value Ref Range  ? Alcohol, Ethyl (B) <10 <10 mg/dL  ?  Comment: (NOTE) ?Lowest detectable limit for serum alcohol is 10 mg/dL. ? ?For medical purposes only. ?Performed at [facility], [address] [place], ?[place]  [address] ?  ?Salicylate level     Status: Abnormal  ? Collection Time: 04/21/21  7:35 PM  ?Result Value Ref Range  ? Salicylate Lvl <7.0 (L) 7.0 - 30.0 mg/dL  ?  Comment: Performed at [facility], [address], [place], [place] [address]  ?Acetaminophen level     Status: Abnormal  ? Collection Time: 04/21/21  7:35 PM  ?Result Value Ref Range  ? Acetaminophen (Tylenol), Serum <10 (L) 10 - 30 ug/mL  ?  Comment: (NOTE) ?Therapeutic concentrations vary significantly. A range of 10-30 ug/mL  ?may be an effective concentration for many patients. However, some  ?are best treated at concentrations outside of this range. ?Acetaminophen concentrations >150 ug/mL at 4 hours after ingestion  ?and >50 ug/mL at 12 hours after ingestion are often associated with  ?toxic reactions. ? ?Performed at [facility], [address] [place], ?[place] [address] ?  ?cbc     Status: None  ? Collection Time: 04/21/21  7:35 PM  ?Result Value Ref Range  ? WBC 9.4 4.0 - 10.5 K/uL  ? RBC 4.80 4.22 - 5.81 MIL/uL  ? Hemoglobin 13.9 13.0 - 17.0 g/dL  ? HCT 42.1 39.0 - 52.0 %  ? MCV 87.7 80.0 - 100.0 fL  ? MCH 29.0 26.0 - 34.0 pg  ? MCHC 33.0 30.0 - 36.0 g/dL  ? RDW 13.6 11.5 - 15.5 %  ? Platelets 332 150 - 400 K/uL  ? nRBC 0.0 0.0 - 0.2 %  ?  Comment: Performed at [facility], [address], [place], [place] [address]  ?Urine Drug Screen, Qualitative     Status: Abnormal  ? Collection Time: 04/21/21  7:36 PM  ?Result Value Ref Range  ? Tricyclic, Ur Screen NONE DETECTED NONE DETECTED  ? Amphetamines, Ur Screen NONE DETECTED NONE DETECTED  ? MDMA (Ecstasy)Ur Screen NONE DETECTED NONE DETECTED  ? Cocaine Metabolite,Ur [place] NONE DETECTED NONE DETECTED  ? Opiate, Ur Screen NONE DETECTED NONE DETECTED  ? Phencyclidine (PCP) Ur S NONE DETECTED NONE DETECTED  ? Cannabinoid 50 Ng, Ur  POSITIVE (A) NONE DETECTED  ? Barbiturates, Ur Screen NONE DETECTED NONE DETECTED  ? Benzodiazepine, Ur Scrn NONE DETECTED NONE DETECTED  ?  Methadone Scn, Ur NONE DETECTED NONE DETECTED  ?  Comment: (NOTE) ?Tricyclics + metabolites, urine    Cutoff 1000 ng/mL ?Amphetamines + metabolites, urine  Cutoff 1000 ng/mL ?MDMA (Ecstasy), urine              Cutoff 500 ng/mL ?Cocaine Metabolite, urine  Cutoff 300 ng/mL ?Opiate + metabolites, urine        Cutoff 300 ng/mL ?Phencyclidine (PCP), urine         Cutoff 25 ng/mL ?Cannabinoid, urine                 Cutoff 50 ng/mL ?Barbiturates + metabolites, urine  Cutoff 200 ng/mL ?Benzodiazepine, urine              Cutoff 200 ng/mL ?Methadone, urine                   Cutoff 300 ng/mL ? ?The urine drug screen provides only a preliminary, unconfirmed ?analytical test result and should not be used for non-medical ?purposes. Clinical consideration and professional judgment should ?be applied to any positive drug screen result due to possible ?interfering substances. A more specific alternate chemical method ?must be used in order to obtain a confirmed analytical result. ?Gas chromatography / mass spectrometry (GC/MS) is the preferred ?confirm atory method. ?Performed at [facility], [address] [place], ?[place] [id number] ?  ?Resp Panel by RT-PCR (Flu A&B, Covid) Nasopharyngeal Swab     Status: None  ? Collection Time: 04/21/21  8:25 PM  ? Specimen: Nasopharyngeal Swab; Nasopharyngeal(NP) swabs in vial transport medium  ?Result Value Ref Range  ? SARS Coronavirus 2 by RT PCR NEGATIVE NEGATIVE  ?  Comment: (NOTE) ?SARS-CoV-2 target nucleic acids are NOT DETECTED. ? ?The SARS-CoV-2 RNA is generally detectable in upper respiratory ?specimens during the acute phase of infection. The lowest ?concentration of SARS-CoV-2 viral copies this assay can detect is ?138 copies/mL. A negative result does not preclude SARS-Cov-2 ?infection and should not be used as the sole basis for treatment or ?other patient management decisions. A negative result may occur with  ?improper specimen collection/handling, submission  of specimen other ?than nasopharyngeal swab, presence of viral mutation(s) within the ?areas targeted by this assay, and inadequate number of viral ?copies(<138 copies/mL). A negative result must be combi

## 2021-04-22 NOTE — Plan of Care (Signed)
?  Problem: Education: ?Goal: Knowledge of [facility] Education information/materials will improve ?Outcome: Not Progressing ?Goal: Emotional status will improve ?Outcome: Not Progressing ?Goal: Mental status will improve ?Outcome: Not Progressing ?Goal: Verbalization of understanding the information provided will improve ?Outcome: Not Progressing ?  ?Problem: Activity: ?Goal: Interest or engagement in activities will improve ?Outcome: Not Progressing ?Goal: Sleeping patterns will improve ?Outcome: Not Progressing ?  ?Problem: Coping: ?Goal: Ability to verbalize frustrations and anger appropriately will improve ?Outcome: Not Progressing ?Goal: Ability to demonstrate self-control will improve ?Outcome: Not Progressing ?  ?Problem: Self-Concept: ?Goal: Ability to identify factors that promote anxiety will improve ?Outcome: Not Progressing ?Goal: Level of anxiety will decrease ?Outcome: Not Progressing ?Goal: Ability to modify response to factors that promote anxiety will improve ?Outcome: Not Progressing ?  ?Problem: Coping: ?Goal: Ability to identify and develop effective coping behavior will improve ?Outcome: Not Progressing ?  ?Problem: Education: ?Goal: Ability to state activities that reduce stress will improve ?Outcome: Not Progressing ?  ?

## 2021-04-22 NOTE — ED Notes (Signed)
Breakfast tray placed at side. ?

## 2021-04-23 DIAGNOSIS — F250 Schizoaffective disorder, bipolar type: Secondary | ICD-10-CM

## 2021-04-23 LAB — LIPID PANEL
Cholesterol: 144 mg/dL (ref 0–200)
HDL: 42 mg/dL (ref >40)
LDL Cholesterol: 77 mg/dL (ref 0–99)
Total CHOL/HDL Ratio: 3.4 RATIO
Triglycerides: 123 mg/dL (ref <150)
VLDL: 25 mg/dL (ref 0–40)

## 2021-04-23 LAB — HEMOGLOBIN A1C
Hgb A1c MFr Bld: 5.7 % — ABNORMAL HIGH (ref 4.8–5.6)
Mean Plasma Glucose: 116.89 mg/dL

## 2021-04-23 MED ORDER — LORAZEPAM 2 MG PO TABS
2.0000 mg | ORAL_TABLET | Freq: Four times a day (QID) | ORAL | Status: DC | PRN
  Administered 2021-04-23 (×2): 2 mg via ORAL
  Filled 2021-04-23 (×2): qty 1

## 2021-04-23 MED ORDER — ENSURE ENLIVE PO LIQD
237.0000 mL | Freq: Two times a day (BID) | ORAL | Status: DC
  Administered 2021-04-23 – 2021-04-25 (×5): 237 mL via ORAL

## 2021-04-23 MED ORDER — ADULT MULTIVITAMIN W/MINERALS CH
1.0000 | ORAL_TABLET | Freq: Every day | ORAL | Status: DC
  Administered 2021-04-24 – 2021-04-25 (×2): 1 via ORAL
  Filled 2021-04-23 (×2): qty 1

## 2021-04-23 MED ORDER — ZIPRASIDONE MESYLATE 20 MG IM SOLR: 20.0000 mg | Freq: Two times a day (BID) | INTRAMUSCULAR | Status: DC | PRN

## 2021-04-23 NOTE — Group Note (Signed)
LCSW Group Therapy Note ? ?Group Date: 04/23/2021 ?Start Time: 1300 ?End Time: 1400 ? ? ?Type of Therapy and Topic:  Group Therapy - How To Cope with Nervousness about Discharge  ? ?Participation Level:  Did Not Attend  ? ?Description of Group ?This process group involved identification of patients' feelings about discharge. Some of them are scheduled to be discharged soon, while others are new admissions, but each of them was asked to share thoughts and feelings surrounding discharge from the hospital. One common theme was that they are excited at the prospect of going home, while another was that many of them are apprehensive about sharing why they were hospitalized. Patients were given the opportunity to discuss these feelings with their peers in preparation for discharge. ? ?Therapeutic Goals ? ?Patient will identify their overall feelings about pending discharge. ?Patient will think about how they might proactively address issues that they believe will once again arise once they get home (i.e. with parents). ?Patients will participate in discussion about having hope for change. ? ? ?Summary of Patient Progress:  Patient did not attend group despite encouraged participation.  ? ? ?Therapeutic Modalities ?Cognitive Behavioral Therapy ? ? ?Corky Crafts, LCSWA ?04/23/2021  2:31 PM   ?

## 2021-04-23 NOTE — Plan of Care (Signed)
?  Problem: Education: ?Goal: Knowledge of [facility] Education information/materials will improve ?Outcome: Progressing ?Goal: Emotional status will improve ?Outcome: Progressing ?Goal: Mental status will improve ?Outcome: Progressing ?Goal: Verbalization of understanding the information provided will improve ?Outcome: Progressing ?  ?Problem: Activity: ?Goal: Interest or engagement in activities will improve ?Outcome: Not Progressing ?Goal: Sleeping patterns will improve ?Outcome: Not Progressing ?  ?Problem: Coping: ?Goal: Ability to verbalize frustrations and anger appropriately will improve ?Outcome: Progressing ?Goal: Ability to demonstrate self-control will improve ?Outcome: Progressing ?  ?Problem: Health Behavior/Discharge Planning: ?Goal: Identification of resources available to assist in meeting health care needs will improve ?Outcome: Progressing ?Goal: Compliance with treatment plan for underlying cause of condition will improve ?Outcome: Progressing ?  ?Problem: Physical Regulation: ?Goal: Ability to maintain clinical measurements within normal limits will improve ?Outcome: Progressing ?  ?Problem: Safety: ?Goal: Periods of time without injury will increase ?Outcome: Progressing ?  ?Problem: Activity: ?Goal: Will verbalize the importance of balancing activity with adequate rest periods ?Outcome: Progressing ?  ?Problem: Education: ?Goal: Will be free of psychotic symptoms ?Outcome: Progressing ?Goal: Knowledge of the prescribed therapeutic regimen will improve ?Outcome: Not Progressing ?  ?Problem: Coping: ?Goal: Coping ability will improve ?Outcome: Progressing ?Goal: Will verbalize feelings ?Outcome: Progressing ?  ?Problem: Health Behavior/Discharge Planning: ?Goal: Compliance with prescribed medication regimen will improve ?Outcome: Not Progressing ?  ?Problem: Nutritional: ?Goal: Ability to achieve adequate nutritional intake will improve ?Outcome: Progressing ?  ?Problem: Role  Relationship: ?Goal: Ability to communicate needs accurately will improve ?Outcome: Progressing ?Goal: Ability to interact with others will improve ?Outcome: Progressing ?  ?Problem: Safety: ?Goal: Ability to redirect hostility and anger into socially appropriate behaviors will improve ?Outcome: Progressing ?Goal: Ability to remain free from injury will improve ?Outcome: Progressing ?  ?Problem: Self-Care: ?Goal: Ability to participate in self-care as condition permits will improve ?Outcome: Progressing ?  ?Problem: Self-Concept: ?Goal: Will verbalize positive feelings about self ?Outcome: Progressing ?  ?Problem: Education: ?Goal: Ability to state activities that reduce stress will improve ?Outcome: Progressing ?  ?Problem: Coping: ?Goal: Ability to identify and develop effective coping behavior will improve ?Outcome: Progressing ?  ?Problem: Self-Concept: ?Goal: Ability to identify factors that promote anxiety will improve ?Outcome: Progressing ?Goal: Level of anxiety will decrease ?Outcome: Progressing ?Goal: Ability to modify response to factors that promote anxiety will improve ?Outcome: Progressing ?  ?

## 2021-04-23 NOTE — [facility] Counselor (Signed)
SW intern and CSW attempted to meet with pt for completion of the PSA. Pt stated that he was not interested in signing anything. SW intern explained that this was an attempt to complete the assessment and that he did not have to sign anything. SW intern inquired if pt would be willing to sit for the assessment. Pt stated that he had nothing left to say to SW intern. Pt continued to deny interest in completing the assessment. No other concerns expressed. Contact ended without incident.  ? ?PSA will be attempted at a later date. ? ?Chalmers Guest. Guerry Bruin, MSW, LCSW, LCAS ?04/23/2021 3:20 PM ? ?

## 2021-04-23 NOTE — Progress Notes (Signed)
Pt has been irritable throughout the day getting loud at times. He did take a PRN twice for anxiety/agitation. He has had relief from those. Pt has attended groups ane went outdoors. Torrie Mayers RN ?

## 2021-04-23 NOTE — Progress Notes (Signed)
Patient at the nurses station upset at the tech for entering his room.  He was receptive to education on the policy in place and apologized to staff for his hostility toward her.   ? ? ?C Butler-Nicholson, LPN ?

## 2021-04-23 NOTE — Progress Notes (Signed)
NUTRITION ASSESSMENT ? ?Pt identified as at risk on the Malnutrition Screen Tool ? ?INTERVENTION: ? ?-Ensure Enlive po BID, each supplement provides 350 kcal and 20 grams of protein ?-MVI with minerals daily ? ?NUTRITION DIAGNOSIS: ?Unintentional weight loss related to sub-optimal intake as evidenced by pt report.  ? ?Goal: ?Pt to meet >/= 90% of their estimated nutrition needs. ? ?Monitor:  ?PO intake ? ?Assessment:  ?Patient returned to ED via IVC after being discharged from ED less than 12 hr. Earlier,  Threatened to "kill his cousin." Paranoid thinking. On evaluation, patient makes good eye contact. Is aware that he just got some Zyprexa. He has not been aggressive this morning, but is somewhat irritable. Puts blanket over his head.  ? ?31 y.o. male ? ?Pt admitted with schizoaffective disorder.  ? ?Per chart review, pt has been refusing group sessions and often agitated. ? ?Pt currently on a regular diet. No meal completion data available to assess at this time.  ? ?Reviewed wt hx; pt has experienced a 4% wt loss over the past year, which is not significant for time frame.  ? ?Given weight loss, pt would benefit from addition of oral nutrition supplements.  ? ?Medications reviewed.   ? ?Labs reviewed.  ? ?Height: ?Ht Readings from Last 1 Encounters:  ?04/22/21 5\' 5"  (1.651 m)  ? ? ?Weight: ?Wt Readings from Last 1 Encounters:  ?04/22/21 87.5 kg  ? ? ?Weight Hx: ?Wt Readings from Last 10 Encounters:  ?04/22/21 87.5 kg  ?04/21/21 90.7 kg  ?04/20/21 90.7 kg  ?03/09/21 91.2 kg  ?05/14/20 94.8 kg  ?05/13/20 97.1 kg  ?05/19/19 100 kg  ?04/13/19 108.9 kg  ?03/25/19 101.6 kg  ?03/25/19 90.7 kg  ? ? ?BMI:  Body mass index is 32.12 kg/m?03/27/19 ?Pt meets criteria for obesity, class I based on current BMI. ? ?Estimated Nutritional Needs: ?Kcal: 25-30 kcal/kg ?Protein: > 1 gram protein/kg ?Fluid: 1 ml/kcal ? ?Diet Order:  ?Diet Order   ? ?       ?  Diet regular Room service appropriate? Yes; Fluid consistency: Thin  Diet  effective now       ?  ? ?  ?  ? ?  ? ?Pt is also offered choice of unit snacks mid-morning and mid-afternoon.  ?Pt is eating as desired.  ? ?Lab results and medications reviewed.  ? ?Marland Kitchen, RD, LDN, CDCES ?Registered Dietitian II ?Certified Diabetes Care and Education Specialist ?Please refer to [facility] for RD and/or RD on-call/weekend/after hours pager   ?

## 2021-04-23 NOTE — Progress Notes (Signed)
Recreation Therapy Notes ? ?Date: 04/23/2021 ? ?Time: 10:05 am   ? ?Location: Courtyard    ? ?Behavioral response: Agitated  ? ?Intervention Topic: Leisure   ? ?Discussion/Intervention:  ?Group content today was focused on leisure. The group defined what leisure is and some positive leisure activities they participate in. Individuals identified the difference between good and bad leisure. Participants expressed how they feel after participating in the leisure of their choice. The group discussed how they go about picking a leisure activity and if others are involved in their leisure activities. The patient stated how many leisure activities they have to choose from and reasons why it is important to have leisure time. Individuals participated in the intervention ?Exploration of Leisure? where they had a chance to identify new leisure activities as well as benefits of leisure. ?Clinical Observations/Feedback: ?Patient came to group late due to unknown reasons. Individual became agitated due to unknown reasons, and walked in and out of group several times.  ?Jenah Vanasten LRT/CTRS  ? ? ? ? ? ? ? ? ? ? ? ?Kenneth Jensen ?04/23/2021 11:51 AM ? ? ? ? ? ? ? ?Kenneth Jensen ?04/23/2021 11:51 AM ?

## 2021-04-23 NOTE — Progress Notes (Signed)
[facility] LCSW Progress Note ?  ?04/23/2021    3:40 PM ? ?[name]  ? ?[id number]  ? ?Type of Contact and Topic:  Direct Care  ? ?CSW attempted to speak with patient in order to deescalate patient. Observed making verbal threats w/ intrusive behaviors. Patient remains at the nurses station staring at staff members. Staff to continue monitoring patient behaviors and intervene accordingly.  ? ?Shortly after this encounter, [facility] called regarding patient. No patient information was disclosed during this call. [facility] sent, via fax, a disclosure agreement; placed in paper chart.  ? ?  ?Signed:  ?Corky Crafts, MSW, LCSWA, LCAS ?04/23/2021 3:40 PM ?  ?  ?

## 2021-04-23 NOTE — H&P (Signed)
Psychiatric Admission Assessment Adult ? ?Patient Identification: Kenneth Jensen ?MRN:  [id number] ?Date of Evaluation:  04/23/2021 ?Chief Complaint:  Schizoaffective disorder (HCC) [F25.9] ?Principal Diagnosis: Schizoaffective disorder (HCC) ?Diagnosis:  Principal Problem: ?  Schizoaffective disorder (HCC) ?Active Problems: ?  Cannabis use disorder, moderate, dependence (HCC) ?  Prediabetes ? ?History of Present Illness: This is a 31 year old man with a history of schizophrenia or schizoaffective disorder who was brought to our emergency room under involuntary commitment paperwork alleging he had threatened his cousin.  Patient is known from prior encounters.  On interview today he is agitated hyperverbal loud and difficult to redirect.  Perseverates on demanding that he be discharged immediately even after that was made clear to him that he would not be discharged today.  Refused to engage in discussion about his treatment other than to say he still gets his medicine from the ACT team.  Denies suicidal or homicidal ideation.  Denies having threatened his cousin.  He does say he is still hearing things but seems to think that his baseline are normal.  Denies that he has been abusing substances. ?Associated Signs/Symptoms: ?Depression Symptoms:  difficulty concentrating, ?anxiety, ?Duration of Depression Symptoms: Greater than two weeks ? ?(Hypo) Manic Symptoms:  Distractibility, ?Impulsivity, ?Irritable Mood, ?Labiality of Mood, ?Anxiety Symptoms:  Excessive Worry, ?Psychotic Symptoms:  Hallucinations: Auditory ?PTSD Symptoms: ?Negative ?Total Time spent with patient: 30 minutes ? ?Past Psychiatric History: Patient has a long history of schizophrenia with multiple previous hospitalizations.  He has also been seen several times in the emergency room when he was not admitted to the hospital.  He frequently has hallucinations and psychotic symptoms at baseline.  No history of suicide attempts.  Today is the most  agitated and aggressive I have seen him I do not think we have a past history of actual violence.  He is seen by the ACT team out of our [facility] and is supposed to be on Depakote and Invega monthly.  He says that he last got his shot on 1 April.  History of cannabis abuse.  Drug screen still positive. ? ?Is the patient at risk to self? Yes.    ?Has the patient been a risk to self in the past 6 months? Yes.    ?Has the patient been a risk to self within the distant past? Yes.    ?Is the patient a risk to others? Yes.    ?Has the patient been a risk to others in the past 6 months? Yes.    ?Has the patient been a risk to others within the distant past? Yes.    ? ?Prior Inpatient Therapy:   ?Prior Outpatient Therapy:   ? ?Alcohol Screening: 1. How often do you have a drink containing alcohol?: Monthly or less ?2. How many drinks containing alcohol do you have on a typical day when you are drinking?: 1 or 2 ?3. How often do you have six or more drinks on one occasion?: Never ?AUDIT-C Score: 1 ?4. How often during the last year have you found that you were not able to stop drinking once you had started?: Never ?5. How often during the last year have you failed to do what was normally expected from you because of drinking?: Never ?6. How often during the last year have you needed a first drink in the morning to get yourself going after a heavy drinking session?: Never ?7. How often during the last year have you had a feeling of guilt of remorse after  drinking?: Never ?8. How often during the last year have you been unable to remember what happened the night before because you had been drinking?: Never ?9. Have you or someone else been injured as a result of your drinking?: No ?10. Has a relative or friend or a doctor or another health worker been concerned about your drinking or suggested you cut down?: No ?Alcohol Use Disorder Identification Test Final Score (AUDIT): 1 ?Substance Abuse History in the last 12 months:  Yes.    ?Consequences of Substance Abuse: ?Daily cannabis use compromises and worsens the treatment of psychosis ?Previous Psychotropic Medications: Yes  ?Psychological Evaluations: Yes  ?Past Medical History:  ?Past Medical History:  ?Diagnosis Date  ? Asthma   ? Bipolar 1 disorder (HCC)   ? Depression   ? Schizophrenia (HCC)   ? Schizotaxia   ? History reviewed. No pertinent surgical history. ?Family History: History reviewed. No pertinent family history. ?Family Psychiatric  History: None reported ?Tobacco Screening:   ?Social History:  ?Social History  ? ?Substance and Sexual Activity  ?Alcohol Use Yes  ? Comment: social  ?   ?Social History  ? ?Substance and Sexual Activity  ?Drug Use Yes  ? Types: Marijuana  ?  ?Additional Social History: ?  ?   ?  ?  ?  ?  ?  ?  ?  ?  ?  ?  ? ?Allergies:   ?Allergies  ?Allergen Reactions  ? Amoxicillin Swelling  ? Haldol [Haloperidol]   ?  Pt states "I went crazy"  ? Percocet [Oxycodone-Acetaminophen]   ? Vicodin [Hydrocodone-Acetaminophen] Itching  ? Vicodin [Hydrocodone-Acetaminophen]   ? ?Lab Results:  ?Results for orders placed or performed during the hospital encounter of 04/22/21 (from the past 48 hour(s))  ?Hemoglobin A1c     Status: Abnormal  ? Collection Time: 04/23/21  7:34 AM  ?Result Value Ref Range  ? Hgb A1c MFr Bld 5.7 (H) 4.8 - 5.6 %  ?  Comment: (NOTE) ?Pre diabetes:          5.7%-6.4% ? ?Diabetes:              >6.4% ? ?Glycemic control for   <7.0% ?adults with diabetes ?  ? Mean Plasma Glucose 116.89 mg/dL  ?  Comment: Performed at [facility], [address], [place], [place] [address]  ?Lipid panel     Status: None  ? Collection Time: 04/23/21  7:34 AM  ?Result Value Ref Range  ? Cholesterol 144 0 - 200 mg/dL  ? Triglycerides 123 <150 mg/dL  ? HDL 42 >40 mg/dL  ? Total CHOL/HDL Ratio 3.4 RATIO  ? VLDL 25 0 - 40 mg/dL  ? LDL Cholesterol 77 0 - 99 mg/dL  ?  Comment:        ?Total Cholesterol/HDL:CHD Risk ?Coronary Heart Disease Risk Table ?                     Men   Women ? 1/2 Average Risk   3.4   3.3 ? Average Risk       5.0   4.4 ? 2 X Average Risk   9.6   7.1 ? 3 X Average Risk  23.4   11.0 ?       ?Use the calculated Patient Ratio ?above and the CHD Risk Table ?to determine the patient's CHD Risk. ?       ?ATP III CLASSIFICATION (LDL): ? <100     mg/dL  Optimal ? 100-129  mg/dL   Near or Above ?                   Optimal ? 130-159  mg/dL   Borderline ? 160-189  mg/dL   High ? >161>190     mg/dL   Very High ?Performed at [facility], [address] [place], [place] [id number] ?  ? ? ?Blood Alcohol level:  ?Lab Results  ?Component Value Date  ? ETH <10 04/21/2021  ? ETH <10 04/20/2021  ? ? ?Metabolic Disorder Labs:  ?Lab Results  ?Component Value Date  ? HGBA1C 5.7 (H) 04/23/2021  ? MPG 116.89 04/23/2021  ? MPG 116.89 05/15/2020  ? ?No results found for: PROLACTIN ?Lab Results  ?Component Value Date  ? CHOL 144 04/23/2021  ? TRIG 123 04/23/2021  ? HDL 42 04/23/2021  ? CHOLHDL 3.4 04/23/2021  ? VLDL 25 04/23/2021  ? LDLCALC 77 04/23/2021  ? LDLCALC 67 05/15/2020  ? ? ?Current Medications: ?Current Facility-Administered Medications  ?Medication Dose Route Frequency Provider Last Rate Last Admin  ? alum & mag hydroxide-simeth (MAALOX/MYLANTA) 200-200-20 MG/5ML suspension 30 mL  30 mL Oral Q4H PRN Gabriel CirriBarthold, Louise F, NP      ? divalproex (DEPAKOTE ER) 24 hr tablet 500 mg  500 mg Oral TID Vanetta MuldersBarthold, Louise F, NP   500 mg at 04/22/21 1715  ? feeding supplement (ENSURE ENLIVE / ENSURE PLUS) liquid 237 mL  237 mL Oral BID BM Merelin Human T, MD      ? LORazepam (ATIVAN) tablet 2 mg  2 mg Oral Q6H PRN Alante Tolan, Jackquline DenmarkJohn T, MD      ? magnesium hydroxide (MILK OF MAGNESIA) suspension 30 mL  30 mL Oral Daily PRN Vanetta MuldersBarthold, Louise F, NP      ? multivitamin with minerals tablet 1 tablet  1 tablet Oral Daily Mauricia Mertens T, MD      ? nicotine (NICODERM CQ - dosed in mg/24 hours) patch 14 mg  14 mg Transdermal Daily Brinley Rosete, Jackquline DenmarkJohn T, MD   14 mg at 04/23/21 [id number]  ? QUEtiapine  (SEROQUEL) tablet 200 mg  200 mg Oral QHS Breyson Kelm T, MD   200 mg at 04/22/21 2118  ? ziprasidone (GEODON) injection 20 mg  20 mg Intramuscular Q12H PRN Edithe Dobbin, Jackquline DenmarkJohn T, MD      ? ?PTA Medications: ?Medic

## 2021-04-23 NOTE — Progress Notes (Signed)
Patient received his qhs meds without incident. He denies si hi avh depression and anxiety.  He at times appears  to be responding although he continues to deny. Has labile mood and at times have been heard talking aggressively.  Will continue monitor with q 15 safety checks.   Encouraged to come to staff with any concerns. ? ? ? ? ?C Butler-Nicholson, LPN ? ? ? ?

## 2021-04-24 DIAGNOSIS — F250 Schizoaffective disorder, bipolar type: Secondary | ICD-10-CM | POA: Diagnosis not present

## 2021-04-24 NOTE — Progress Notes (Signed)
?   04/24/21 0850  ?Psych Admission Type (Psych Patients Only)  ?Admission Status Involuntary  ?Psychosocial Assessment  ?Patient Complaints Loneliness  ?Eye Contact Poor  ?Facial Expression Flat  ?Affect Blunted  ?Speech Slow  ?Interaction Avoidant;Isolative;Poor  ?Motor Activity Slow  ?Appearance/Hygiene In scrubs  ?Behavior Characteristics Guarded  ?Mood Depressed  ?Thought Process  ?Coherency WDL  ?Content UTA  ?Delusions None reported or observed  ?Perception WDL  ?Hallucination None reported or observed  ?Judgment Limited  ?Confusion None  ?Danger to Self  ?Current suicidal ideation? Denies ?(Denies)  ?Danger to Others  ?Danger to Others None reported or observed  ? ? ?

## 2021-04-24 NOTE — Progress Notes (Signed)
Patient at the nurses station upset at staff. Reports needing to go home and not being heard by staff regarding his issues.  Provided supportive listening to de escalate the situation.  Patient calmed down for now. Will continue to monitor and encourage him to seek staff with any concerns. ? ? ? ? ?C Butler-Nicholson, LPN ?

## 2021-04-24 NOTE — BH IP Treatment Plan (Signed)
Interdisciplinary Treatment and Diagnostic Plan Update ? ?04/24/2021 ?Time of Session: 0930 ?Kenneth Jensen ?MRN: [id number] ? ?Principal Diagnosis: Schizoaffective disorder (HCC) ? ?Secondary Diagnoses: Principal Problem: ?  Schizoaffective disorder (HCC) ?Active Problems: ?  Cannabis use disorder, moderate, dependence (HCC) ?  Prediabetes ? ? ?Current Medications:  ?Current Facility-Administered Medications  ?Medication Dose Route Frequency Provider Last Rate Last Admin  ? alum & mag hydroxide-simeth (MAALOX/MYLANTA) 200-200-20 MG/5ML suspension 30 mL  30 mL Oral Q4H PRN Gabriel Cirri F, NP      ? divalproex (DEPAKOTE ER) 24 hr tablet 500 mg  500 mg Oral TID Vanetta Mulders, NP   500 mg at 04/24/21 5830  ? feeding supplement (ENSURE ENLIVE / ENSURE PLUS) liquid 237 mL  237 mL Oral BID BM Clapacs, John T, MD   237 mL at 04/23/21 1700  ? LORazepam (ATIVAN) tablet 2 mg  2 mg Oral Q6H PRN Clapacs, John T, MD   2 mg at 04/23/21 1728  ? magnesium hydroxide (MILK OF MAGNESIA) suspension 30 mL  30 mL Oral Daily PRN Gabriel Cirri F, NP      ? multivitamin with minerals tablet 1 tablet  1 tablet Oral Daily Clapacs, Jackquline Denmark, MD   1 tablet at 04/24/21 0853  ? nicotine (NICODERM CQ - dosed in mg/24 hours) patch 14 mg  14 mg Transdermal Daily Clapacs, Jackquline Denmark, MD   14 mg at 04/23/21 9407  ? QUEtiapine (SEROQUEL) tablet 200 mg  200 mg Oral QHS Clapacs, John T, MD   200 mg at 04/23/21 2111  ? ziprasidone (GEODON) injection 20 mg  20 mg Intramuscular Q12H PRN Clapacs, Jackquline Denmark, MD      ? ?PTA Medications: ?Medications Prior to Admission  ?Medication Sig Dispense Refill Last Dose  ? divalproex (DEPAKOTE ER) 500 MG 24 hr tablet Take 1 tablet (500 mg total) by mouth 3 (three) times daily. 90 tablet 1   ? paliperidone (INVEGA SUSTENNA) 234 MG/1.5ML SUSY injection Inject 234 mg into the muscle every 30 (thirty) days.     ? traMADol (ULTRAM) 50 MG tablet Take 50 mg by mouth every 6 (six) hours as needed.     ? ? ?Patient  Stressors: Marital or family conflict   ?Medication change or noncompliance   ? ?Patient Strengths: Motivation for treatment/growth  ?Physical Health  ? ?Treatment Modalities: Medication Management, Group therapy, Case management,  ?1 to 1 session with clinician, Psychoeducation, Recreational therapy. ? ? ?Physician Treatment Plan for Primary Diagnosis: Schizoaffective disorder (HCC) ?Long Term Goal(s): Improvement in symptoms so as ready for discharge  ? ?Short Term Goals: Ability to maintain clinical measurements within normal limits will improve ?Compliance with prescribed medications will improve ?Ability to verbalize feelings will improve ?Ability to demonstrate self-control will improve ?Ability to identify and develop effective coping behaviors will improve ? ?Medication Management: Evaluate patient's response, side effects, and tolerance of medication regimen. ? ?Therapeutic Interventions: 1 to 1 sessions, Unit Group sessions and Medication administration. ? ?Evaluation of Outcomes: Progressing ? ?Physician Treatment Plan for Secondary Diagnosis: Principal Problem: ?  Schizoaffective disorder (HCC) ?Active Problems: ?  Cannabis use disorder, moderate, dependence (HCC) ?  Prediabetes ? ?Long Term Goal(s): Improvement in symptoms so as ready for discharge  ? ?Short Term Goals: Ability to maintain clinical measurements within normal limits will improve ?Compliance with prescribed medications will improve ?Ability to verbalize feelings will improve ?Ability to demonstrate self-control will improve ?Ability to identify and develop effective coping behaviors will improve    ? ?  Medication Management: Evaluate patient's response, side effects, and tolerance of medication regimen. ? ?Therapeutic Interventions: 1 to 1 sessions, Unit Group sessions and Medication administration. ? ?Evaluation of Outcomes: Progressing ? ? ?RN Treatment Plan for Primary Diagnosis: Schizoaffective disorder (HCC) ?Long Term Goal(s):  Knowledge of disease and therapeutic regimen to maintain health will improve ? ?Short Term Goals: Ability to remain free from injury will improve, Ability to verbalize frustration and anger appropriately will improve, Ability to demonstrate self-control, Ability to participate in decision making will improve, Ability to verbalize feelings will improve, Ability to disclose and discuss suicidal ideas, Ability to identify and develop effective coping behaviors will improve, and Compliance with prescribed medications will improve ? ?Medication Management: RN will administer medications as ordered by provider, will assess and evaluate patient's response and provide education to patient for prescribed medication. RN will report any adverse and/or side effects to prescribing provider. ? ?Therapeutic Interventions: 1 on 1 counseling sessions, Psychoeducation, Medication administration, Evaluate responses to treatment, Monitor vital signs and CBGs as ordered, Perform/monitor CIWA, COWS, AIMS and Fall Risk screenings as ordered, Perform wound care treatments as ordered. ? ?Evaluation of Outcomes: Progressing ? ? ?LCSW Treatment Plan for Primary Diagnosis: Schizoaffective disorder (HCC) ?Long Term Goal(s): Safe transition to appropriate next level of care at discharge, Engage patient in therapeutic group addressing interpersonal concerns. ? ?Short Term Goals: Engage patient in aftercare planning with referrals and resources, Increase social support, Increase ability to appropriately verbalize feelings, Increase emotional regulation, Facilitate acceptance of mental health diagnosis and concerns, Facilitate patient progression through stages of change regarding substance use diagnoses and concerns, and Identify triggers associated with mental health/substance abuse issues ? ?Therapeutic Interventions: Assess for all discharge needs, 1 to 1 time with Child psychotherapist, Explore available resources and support systems, Assess for  adequacy in community support network, Educate family and significant other(s) on suicide prevention, Complete Psychosocial Assessment, Interpersonal group therapy. ? ?Evaluation of Outcomes: Progressing ? ? ?Progress in Treatment: ?Attending groups: No. ?Participating in groups: No. ?Taking medication as prescribed: Yes. ?Toleration medication: Yes. ?Family/Significant other contact made: No, will contact:  CSW will obtain consent for CSW to reach family/friend.  ?Patient understands diagnosis: No. ?Discussing patient identified problems/goals with staff: No. ?Medical problems stabilized or resolved: Yes. ?Denies suicidal/homicidal ideation: Yes. ?Issues/concerns per patient self-inventory: Yes. ?Other: none  ? ?New problem(s) identified: No, Describe:  No additional problems/concerns at this time. Patient continues to exhibit psychotic features, remains intrusive, and aggressive/making verbal threats.  ? ?New Short Term/Long Term Goal(s):  Patient to work towards detox, elimination of symptoms of psychosis, medication management for mood stabilization;  development of comprehensive mental wellness/sobriety plan. ? ?Patient Goals:  States, "getting out and take it one day at a time."  ? ?Discharge Plan or Barriers: No psychosocial barriers identified at this time, patient to return to place of residence when appropriate for discharge.  ? ?Reason for Continuation of Hospitalization: Delusions  ?Other; describe aggressive behaviors ? ?Estimated Length of Stay: 1-7 days  ? ?Last 3 [place] Suicide Severity Risk Score: ?Flowsheet Row Admission (Current) from 04/22/2021 in [facility] INPATIENT BEHAVIORAL MEDICINE ED from 04/21/2021 in [facility]ERGENCY [facility]PART[facility] ED from 04/20/2021 in [facility] DEPARTMENT  ?C-SSRS RISK CATEGORY No Risk No Risk No Risk  ? ?  ? ? ?Last PHQ 2/9 Scores: ?   ? View : No data to display.  ?  ?  ?  ? ? ?Scribe for Treatment Team: ?Jacqulyn Ducking  Amrie Gurganus, LCSWA ?04/24/2021 ?10:38 AM ? ? ?

## 2021-04-24 NOTE — Progress Notes (Signed)
Patient received his QHS medication without incident. He denies si hi avh depression and pain.  He does endorse anxiety and paranoia at this encounter.  He is much calmer and cooperative at this encounter,  Will continue to monitor with q 15 minute safety checks.  Encouraged him  to seek staff with any concerns. ? ? ? ? ?C Butler-Nicholson, LPN ?

## 2021-04-24 NOTE — Group Note (Signed)
[facility] LCSW Group Therapy Note ? ? ?Group Date: 04/24/2021 ?Start Time: 1330 ?End Time: 1430 ? ? ?Type of Therapy/Topic:  Group Therapy:  Emotion Regulation ? ?Participation Level:  Did Not Attend  ? ? ? ?Description of Group:   ? The purpose of this group is to assist patients in learning to regulate negative emotions and experience positive emotions. Patients will be guided to discuss ways in which they have been vulnerable to their negative emotions. These vulnerabilities will be juxtaposed with experiences of positive emotions or situations, and patients challenged to use positive emotions to combat negative ones. Special emphasis will be placed on coping with negative emotions in conflict situations, and patients will process healthy conflict resolution skills. ? ?Therapeutic Goals: ?Patient will identify two positive emotions or experiences to reflect on in order to balance out negative emotions:  ?Patient will label two or more emotions that they find the most difficult to experience:  ?Patient will be able to demonstrate positive conflict resolution skills through discussion or role plays:  ? ?Summary of Patient Progress: ? ? ?x ? ? ? ?Therapeutic Modalities:   ?Cognitive Behavioral Therapy ?Feelings Identification ?Dialectical Behavioral Therapy ? ? ?Davonta Stroot F Cosima Prentiss, Student-Social Work ?

## 2021-04-24 NOTE — Progress Notes (Signed)
[facility] MD Progress Note ? ?04/24/2021 12:05 PM ?Kenneth Jensen  ?MRN:  [id number] ?Subjective: Follow-up 31 year old man with schizoaffective disorder.  Patient seen and he also attended treatment team.  Appears significantly calmer today.  He is not up pacing around the unit.  Nevertheless his insight is still poor and he is completely focused on wanting to be discharged immediately.  Slightly irritable but not threatening or intrusive today.  Taking his medicine.  No physical complaints patient's ACT team asked permission to come and talk with him this afternoon which she agreed to ?Principal Problem: Schizoaffective disorder (HCC) ?Diagnosis: Principal Problem: ?  Schizoaffective disorder (HCC) ?Active Problems: ?  Cannabis use disorder, moderate, dependence (HCC) ?  Prediabetes ? ?Total Time spent with patient: 30 minutes ? ?Past Psychiatric History: Past history of schizophrenia or schizoaffective disorder with frequent noncompliance spells ? ?Past Medical History:  ?Past Medical History:  ?Diagnosis Date  ? Asthma   ? Bipolar 1 disorder (HCC)   ? Depression   ? Schizophrenia (HCC)   ? Schizotaxia   ? History reviewed. No pertinent surgical history. ?Family History: History reviewed. No pertinent family history. ?Family Psychiatric  History: See previous ?Social History:  ?Social History  ? ?Substance and Sexual Activity  ?Alcohol Use Yes  ? Comment: social  ?   ?Social History  ? ?Substance and Sexual Activity  ?Drug Use Yes  ? Types: Marijuana  ?  ?Social History  ? ?Socioeconomic History  ? Marital status: Single  ?  Spouse name: Not on file  ? Number of children: Not on file  ? Years of education: Not on file  ? Highest education level: Not on file  ?Occupational History  ? Not on file  ?Tobacco Use  ? Smoking status: Every Day  ?  Packs/day: 0.50  ?  Types: Cigarettes  ? Smokeless tobacco: Never  ?Substance and Sexual Activity  ? Alcohol use: Yes  ?  Comment: social  ? Drug use: Yes  ?  Types: Marijuana   ? Sexual activity: Not Currently  ?Other Topics Concern  ? Not on file  ?Social History Narrative  ? ** Merged History Encounter **  ?    ? ?Social Determinants of Health  ? ?Financial Resource Strain: Not on file  ?Food Insecurity: Not on file  ?Transportation Needs: Not on file  ?Physical Activity: Not on file  ?Stress: Not on file  ?Social Connections: Not on file  ? ?Additional Social History:  ?  ?  ?  ?  ?  ?  ?  ?  ?  ?  ?  ? ?Sleep: Fair ? ?Appetite:  Fair ? ?Current Medications: ?Current Facility-Administered Medications  ?Medication Dose Route Frequency Provider Last Rate Last Admin  ? alum & mag hydroxide-simeth (MAALOX/MYLANTA) 200-200-20 MG/5ML suspension 30 mL  30 mL Oral Q4H PRN Gabriel Cirri F, NP      ? divalproex (DEPAKOTE ER) 24 hr tablet 500 mg  500 mg Oral TID Vanetta Mulders, NP   500 mg at 04/24/21 6468  ? feeding supplement (ENSURE ENLIVE / ENSURE PLUS) liquid 237 mL  237 mL Oral BID BM Vendela Troung T, MD   237 mL at 04/24/21 1054  ? LORazepam (ATIVAN) tablet 2 mg  2 mg Oral Q6H PRN Tieara Flitton, Jackquline Denmark, MD   2 mg at 04/23/21 1728  ? magnesium hydroxide (MILK OF MAGNESIA) suspension 30 mL  30 mL Oral Daily PRN Vanetta Mulders, NP      ?  multivitamin with minerals tablet 1 tablet  1 tablet Oral Daily Aquil Duhe, Jackquline DenmarkJohn T, MD   1 tablet at 04/24/21 0853  ? nicotine (NICODERM CQ - dosed in mg/24 hours) patch 14 mg  14 mg Transdermal Daily Jeremiah Curci, Jackquline DenmarkJohn T, MD   14 mg at 04/23/21 16100738  ? QUEtiapine (SEROQUEL) tablet 200 mg  200 mg Oral QHS Teague Goynes T, MD   200 mg at 04/23/21 2111  ? ziprasidone (GEODON) injection 20 mg  20 mg Intramuscular Q12H PRN Cypher Paule, Jackquline DenmarkJohn T, MD      ? ? ?Lab Results:  ?Results for orders placed or performed during the hospital encounter of 04/22/21 (from the past 48 hour(s))  ?Hemoglobin A1c     Status: Abnormal  ? Collection Time: 04/23/21  7:34 AM  ?Result Value Ref Range  ? Hgb A1c MFr Bld 5.7 (H) 4.8 - 5.6 %  ?  Comment: (NOTE) ?Pre diabetes:           5.7%-6.4% ? ?Diabetes:              >6.4% ? ?Glycemic control for   <7.0% ?adults with diabetes ?  ? Mean Plasma Glucose 116.89 mg/dL  ?  Comment: Performed at [facility], [address] [place], [place] [address]  ?Lipid panel     Status: None  ? Collection Time: 04/23/21  7:34 AM  ?Result Value Ref Range  ? Cholesterol 144 0 - 200 mg/dL  ? Triglycerides 123 <150 mg/dL  ? HDL 42 >40 mg/dL  ? Total CHOL/HDL Ratio 3.4 RATIO  ? VLDL 25 0 - 40 mg/dL  ? LDL Cholesterol 77 0 - 99 mg/dL  ?  Comment:        ?Total Cholesterol/HDL:CHD Risk ?Coronary Heart Disease Risk Table ?                    Men   Women ? 1/2 Average Risk   3.4   3.3 ? Average Risk       5.0   4.4 ? 2 X Average Risk   9.6   7.1 ? 3 X Average Risk  23.4   11.0 ?       ?Use the calculated Patient Ratio ?above and the CHD Risk Table ?to determine the patient's CHD Risk. ?       ?ATP III CLASSIFICATION (LDL): ? <100     mg/dL   Optimal ? 409-811100-129  mg/dL   Near or Above ?                   Optimal ? 130-159  mg/dL   Borderline ? 160-189  mg/dL   High ? >914>190     mg/dL   Very High ?Performed at [facility], [address] [place], [place] [address] ?  ? ? ?Blood Alcohol level:  ?Lab Results  ?Component Value Date  ? ETH <10 04/21/2021  ? ETH <10 04/20/2021  ? ? ?Metabolic Disorder Labs: ?Lab Results  ?Component Value Date  ? HGBA1C 5.7 (H) 04/23/2021  ? MPG 116.89 04/23/2021  ? MPG 116.89 05/15/2020  ? ?No results found for: PROLACTIN ?Lab Results  ?Component Value Date  ? CHOL 144 04/23/2021  ? TRIG 123 04/23/2021  ? HDL 42 04/23/2021  ? CHOLHDL 3.4 04/23/2021  ? VLDL 25 04/23/2021  ? LDLCALC 77 04/23/2021  ? LDLCALC 67 05/15/2020  ? ? ?Physical Findings: ?AIMS:  , ,  ,  ,    ?  CIWA:    ?COWS:    ? ?Musculoskeletal: ?Strength & Muscle Tone: within normal limits ?Gait & Station: normal ?Patient leans: N/A ? ?Psychiatric Specialty Exam: ? ?Presentation  ?General Appearance: Casual ? ?Eye Contact:Fair ? ?Speech:Clear and Coherent ? ?Speech  Volume:Normal ? ?Handedness:Right ? ? ?Mood and Affect  ?Mood:Dysphoric ? ?Affect:Blunt ? ? ?Thought Process  ?Thought Processes:Disorganized ? ?Descriptions of Associations:Intact ? ?Orientation:Full (Time, Place and Person) ? ?Thought Content:Logical ? ?History of Schizophrenia/Schizoaffective disorder:Yes ? ?Duration of Psychotic Symptoms:Greater than six months ? ?Hallucinations:No data recorded ?Ideas of Reference:Delusions ? ?Suicidal Thoughts:No data recorded ?Homicidal Thoughts:No data recorded ? ?Sensorium  ?Memory:Immediate Fair ? ?Judgment:Impaired ? ?Insight:Poor ? ? ?Executive Functions  ?Concentration:Fair ? ?Attention Span:Fair ? ?Recall:Fair ? ?Fund of Knowledge:Fair ? ?Language:Fair ? ? ?Psychomotor Activity  ?Psychomotor Activity:No data recorded ? ?Assets  ?Assets:Resilience ? ? ?Sleep  ?Sleep:No data recorded ? ? ?Physical Exam: ?Physical Exam ?Vitals and nursing note reviewed.  ?Constitutional:   ?   Appearance: Normal appearance.  ?HENT:  ?   Head: Normocephalic and atraumatic.  ?   Mouth/Throat:  ?   Pharynx: Oropharynx is clear.  ?Eyes:  ?   Pupils: Pupils are equal, round, and reactive to light.  ?Cardiovascular:  ?   Rate and Rhythm: Normal rate and regular rhythm.  ?Pulmonary:  ?   Effort: Pulmonary effort is normal.  ?   Breath sounds: Normal breath sounds.  ?Abdominal:  ?   General: Abdomen is flat.  ?   Palpations: Abdomen is soft.  ?Musculoskeletal:     ?   General: Normal range of motion.  ?Skin: ?   General: Skin is warm and dry.  ?Neurological:  ?   General: No focal deficit present.  ?   Mental Status: He is alert. Mental status is at baseline.  ?Psychiatric:     ?   Attention and Perception: He is inattentive.     ?   Mood and Affect: Mood normal. Affect is blunt.     ?   Speech: Speech is delayed.     ?   Behavior: Behavior is slowed.     ?   Thought Content: Thought content normal.     ?   Cognition and Memory: Cognition is impaired.  ? ?Review of Systems  ?Constitutional:  Negative.   ?HENT: Negative.    ?Eyes: Negative.   ?Respiratory: Negative.    ?Cardiovascular: Negative.   ?Gastrointestinal: Negative.   ?Musculoskeletal: Negative.   ?Skin: Negative.   ?Neurological: Negative.   ?Psychia

## 2021-04-24 NOTE — [facility] Suicide Risk Assessment (Signed)
[facility] INPATIENT:  Family/Significant Other Suicide Prevention Education ? ?Suicide Prevention Education:  ?Patient Refusal for Family/Significant Other Suicide Prevention Education: The patient Kenneth Jensen has refused to provide written consent for family/significant other to be provided Family/Significant Other Suicide Prevention Education during admission and/or prior to discharge.  Physician notified. ? ?SPE completed with pt, as pt refused to consent to family contact. SPI pamphlet provided to pt and pt was encouraged to share information with support network, ask questions, and talk about any concerns relating to SPE. Pt denies access to guns/firearms and verbalized understanding of information provided. Mobile Crisis information also provided to pt.  ? ? ?Harden Mo ?04/24/2021, 12:40 PM ?
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
## 2021-04-24 NOTE — Progress Notes (Signed)
Patient refused breakfast this morning. Patient was difficult to wake up. Stated he just wanted to sleep. Patient did take morning medications. Patient attended treatment team meeting. Patient went outside today. Denies SI/HI/AVH.  ?

## 2021-04-24 NOTE — Plan of Care (Signed)
?  Problem: Education: ?Goal: Emotional status will improve ?Outcome: Progressing ?Goal: Mental status will improve ?Outcome: Progressing ?Goal: Verbalization of understanding the information provided will improve ?Outcome: Progressing ?  ?Problem: Coping: ?Goal: Ability to verbalize frustrations and anger appropriately will improve ?Outcome: Progressing ?  ?Problem: Coping: ?Goal: Will verbalize feelings ?Outcome: Progressing ?  ?

## 2021-04-24 NOTE — Progress Notes (Signed)
Recreation Therapy Notes ? ? ?Date: 04/24/2021 ?  ?Time: 10:50 am ?  ?Location: Craft room    ?  ?Behavioral response: N/A ? ?Intervention Topic: Self-esteem   ?  ?Discussion/Intervention:  ?Patient refused to attend group. ? ?Clinical Observations/Feedback: ?Patient refused to attend group. ? ?Kenneth Jensen LRT/CTRS  ? ? ? ? ? ? ? ?Kenneth Jensen ?04/24/2021 3:16 PM ?

## 2021-04-25 DIAGNOSIS — F250 Schizoaffective disorder, bipolar type: Secondary | ICD-10-CM | POA: Diagnosis not present

## 2021-04-25 MED ORDER — QUETIAPINE FUMARATE 200 MG PO TABS: 200.0000 mg | ORAL_TABLET | Freq: Every day | ORAL | 1 refills | Status: DC

## 2021-04-25 MED ORDER — NICOTINE 14 MG/24HR TD PT24: 14.0000 mg | MEDICATED_PATCH | Freq: Every day | TRANSDERMAL | 0 refills | Status: DC

## 2021-04-25 MED ORDER — PALIPERIDONE PALMITATE ER 234 MG/1.5ML IM SUSY: 234.0000 mg | PREFILLED_SYRINGE | INTRAMUSCULAR | 1 refills | Status: DC

## 2021-04-25 MED ORDER — DIVALPROEX SODIUM ER 500 MG PO TB24: 500.0000 mg | ORAL_TABLET | Freq: Three times a day (TID) | ORAL | 1 refills | Status: DC

## 2021-04-25 MED ORDER — PALIPERIDONE PALMITATE ER 156 MG/ML IM SUSY: 156.0000 mg | PREFILLED_SYRINGE | INTRAMUSCULAR | Status: DC

## 2021-04-25 NOTE — Progress Notes (Signed)
Patient refused his scheduled Nicotine patch, stating to this writer "them things are toxic". MD will be notified. ?

## 2021-04-25 NOTE — [facility] Suicide Risk Assessment (Signed)
[facility] Discharge Suicide Risk Assessment ? ? ?Principal Problem: Schizoaffective disorder (HCC) ?Discharge Diagnoses: Principal Problem: ?  Schizoaffective disorder (HCC) ?Active Problems: ?  Cannabis use disorder, moderate, dependence (HCC) ?  Prediabetes ? ? ?Total Time spent with patient: 30 minutes ? ?Musculoskeletal: ?Strength & Muscle Tone: within normal limits ?Gait & Station: normal ?Patient leans: N/A ? ?Psychiatric Specialty Exam ? ?Presentation  ?General Appearance: Casual ? ?Eye Contact:Fair ? ?Speech:Clear and Coherent ? ?Speech Volume:Normal ? ?Handedness:Right ? ? ?Mood and Affect  ?Mood:Dysphoric ? ?Duration of Depression Symptoms: Greater than two weeks ? ?Affect:Blunt ? ? ?Thought Process  ?Thought Processes:Disorganized ? ?Descriptions of Associations:Intact ? ?Orientation:Full (Time, Place and Person) ? ?Thought Content:Logical ? ?History of Schizophrenia/Schizoaffective disorder:Yes ? ?Duration of Psychotic Symptoms:Greater than six months ? ?Hallucinations:No data recorded ?Ideas of Reference:Delusions ? ?Suicidal Thoughts:No data recorded ?Homicidal Thoughts:No data recorded ? ?Sensorium  ?Memory:Immediate Fair ? ?Judgment:Impaired ? ?Insight:Poor ? ? ?Executive Functions  ?Concentration:Fair ? ?Attention Span:Fair ? ?Recall:Fair ? ?Fund of Knowledge:Fair ? ?Language:Fair ? ? ?Psychomotor Activity  ?Psychomotor Activity:No data recorded ? ?Assets  ?Assets:Resilience ? ? ?Sleep  ?Sleep:No data recorded ? ?Physical Exam: ?Physical Exam ?Vitals and nursing note reviewed.  ?Constitutional:   ?   Appearance: Normal appearance.  ?HENT:  ?   Head: Normocephalic and atraumatic.  ?   Mouth/Throat:  ?   Pharynx: Oropharynx is clear.  ?Eyes:  ?   Pupils: Pupils are equal, round, and reactive to light.  ?Cardiovascular:  ?   Rate and Rhythm: Normal rate and regular rhythm.  ?Pulmonary:  ?   Effort: Pulmonary effort is normal.  ?   Breath sounds: Normal breath sounds.  ?Abdominal:  ?   General: Abdomen is  flat.  ?   Palpations: Abdomen is soft.  ?Musculoskeletal:     ?   General: Normal range of motion.  ?Skin: ?   General: Skin is warm and dry.  ?Neurological:  ?   General: No focal deficit present.  ?   Mental Status: He is alert. Mental status is at baseline.  ?Psychiatric:     ?   Attention and Perception: Attention normal.     ?   Mood and Affect: Mood normal. Affect is blunt.     ?   Speech: Speech normal.     ?   Behavior: Behavior is cooperative.     ?   Thought Content: Thought content normal.     ?   Cognition and Memory: Cognition normal.     ?   Judgment: Judgment normal.  ? ?Review of Systems  ?Constitutional: Negative.   ?HENT: Negative.    ?Eyes: Negative.   ?Respiratory: Negative.    ?Cardiovascular: Negative.   ?Gastrointestinal: Negative.   ?Musculoskeletal: Negative.   ?Skin: Negative.   ?Neurological: Negative.   ?Psychiatric/Behavioral: Negative.    ?Blood pressure 122/70, pulse 82, temperature 97.7 ?F (36.5 ?C), temperature source Oral, resp. rate 18, height 5\' 5"  (1.651 m), weight 87.5 kg, SpO2 100 %. Body mass index is 32.12 kg/m?. ? ?Mental Status Per Nursing Assessment::   ?On Admission:  NA ? ?Demographic Factors:  ?Male and Living alone ? ?Loss Factors: ?NA ? ?Historical Factors: ?Impulsivity ? ?Risk Reduction Factors:   ?Positive social support, Positive therapeutic relationship, and Positive coping skills or problem solving skills ? ?Continued Clinical Symptoms:  ?Schizophrenia:   Less than 58 years old ? ?Cognitive Features That Contribute To Risk:  ?None   ? ?Suicide Risk:  ?Minimal: No identifiable  suicidal ideation.  Patients presenting with no risk factors but with morbid ruminations; may be classified as minimal risk based on the severity of the depressive symptoms ? ? ? ?Plan Of Care/Follow-up recommendations:  ?Patient is completely denying any suicidal ideation.  His mood is calm and his behavior is much improved.  Not aggressive or hostile.  No overt psychosis evidence today.   Agrees to continue medicine and has ACT team support ? ?Mordecai Rasmussen, MD ?04/25/2021, 10:39 AM ?

## 2021-04-25 NOTE — Plan of Care (Signed)
?  Problem: Education: ?Goal: Emotional status will improve ?Outcome: Progressing ?Goal: Mental status will improve ?Outcome: Progressing ?  ?Problem: Coping: ?Goal: Ability to verbalize frustrations and anger appropriately will improve ?Outcome: Progressing ?  ?Problem: Coping: ?Goal: Will verbalize feelings ?Outcome: Progressing ?  ?

## 2021-04-25 NOTE — Progress Notes (Signed)
Patient is better this evening. Much calmer and able to engage well in conversation.  Requested help calling his aunt and was able to make contact.   Received his ,meds and tolerated without incident.  Denies si hi depression and anxiety.  Endorses avh talks in depth about supernatural events and hating people who act like they don't see them.  Was receptive to maybe they actually don't see what he sees and that may be unique  to only him. Will continue to monitor with q 15 minute safety checks.   ? ? ? ?C Butler-Nicholson, LPN ?

## 2021-04-25 NOTE — Progress Notes (Signed)
Pt refused to get up for V/S this morning. Staff went to his room and patient stated he didn't sleep well and doesn't want them done at this time.  ?

## 2021-04-25 NOTE — Progress Notes (Signed)
Patient is still sleeping and stated to staff that he would get up later to take morning medication. MD will be notified. ?

## 2021-04-25 NOTE — Progress Notes (Addendum)
?  [facility] Adult Case Management Discharge Plan : ? ?Will you be returning to the same living situation after discharge:  Yes,  Patient to return to place of residence.  ? ?At discharge, do you have transportation home?: Yes,  Patient has transportation.   Patient's ACTT team lead Darius to provide transportation from hospital.  ? ?Do you have the ability to pay for your medications: Yes,  [employer] Medicare.  Patient informed of right to appeal discharge, provided phone number to [facility]. Patient expressed no interest in appealing discharge at this time. CSW will continue to monitor situation. ? ?Release of information consent forms completed and in the chart;  Patient's signature needed at discharge. ? ?Patient to Follow up at: ? ? Follow-up Information   ? ? [facility].   ?Why: A member from your ACTT will meet you at your residence for 7 consecutive days after discharge from the hospital. ?Contact information: ?[address] ?[place] [address] ?[phone] ? ? ?  ?  ? ?  ?  ? ?  ?  ? ?Next level of care provider has access to [facility] ? ?Safety Planning and Suicide Prevention discussed: Yes,  SPE completed with patient, declined consent for CSW to reach out to family/friend. ?  ?Has patient been referred to the Quitline?: Patient refused referral ?Tobacco Use: High Risk  ? Smoking Tobacco Use: Every Day  ? Smokeless Tobacco Use: Never  ? Passive Exposure: Not on file  ?  ? ?Patient has been referred for addiction treatment: Pt. refused referral Patient denies active substance use. UDS positive for cannabis, denies any interested in receiving SUD tx.  ? ?Durenda Hurt, LCSWA ?04/25/2021, 10:30 AM ?

## 2021-04-25 NOTE — Plan of Care (Signed)
D- Patient alert and oriented. Patient presents in a pleasant mood on assessment stating that he slept ok last night, "it took me a while to go to sleep, but once I got to sleep, it was hard for me to get up". Patient also had complaints of left tooth pain, rating it a "7/10", however, he did not request any PRN pain medication from this Probation officer. Patient denies anxiety, but endorsed depression, stating that he is always depressed. Patient states that "freedom" helps with his depression. Patient also denies SI, HI, AVH at this time. Patient's goal for today is to talk to Dr. Weber Cooks and see about discharge. ? ?A- Some scheduled medications administered to patient, per MD orders. Support and encouragement provided.  Routine safety checks conducted every 15 minutes.  Patient informed to notify staff with problems or concerns. ? ?R- No adverse drug reactions noted. Patient contracts for safety at this time. Patient compliant with medications and treatment plan. Patient receptive, calm, and cooperative. Patient interacts well with others on the unit.  Patient remains safe at this time. ? ?Problem: Education: ?Goal: Knowledge of [facility] Education information/materials will improve ?Outcome: Progressing ?Goal: Emotional status will improve ?Outcome: Progressing ?Goal: Mental status will improve ?Outcome: Progressing ?Goal: Verbalization of understanding the information provided will improve ?Outcome: Progressing ?  ?Problem: Activity: ?Goal: Interest or engagement in activities will improve ?Outcome: Progressing ?Goal: Sleeping patterns will improve ?Outcome: Progressing ?  ?Problem: Coping: ?Goal: Ability to verbalize frustrations and anger appropriately will improve ?Outcome: Progressing ?Goal: Ability to demonstrate self-control will improve ?Outcome: Progressing ?  ?Problem: Health Behavior/Discharge Planning: ?Goal: Identification of resources available to assist in meeting health care needs will  improve ?Outcome: Progressing ?Goal: Compliance with treatment plan for underlying cause of condition will improve ?Outcome: Progressing ?  ?Problem: Physical Regulation: ?Goal: Ability to maintain clinical measurements within normal limits will improve ?Outcome: Progressing ?  ?Problem: Safety: ?Goal: Periods of time without injury will increase ?Outcome: Progressing ?  ?Problem: Activity: ?Goal: Will verbalize the importance of balancing activity with adequate rest periods ?Outcome: Progressing ?  ?Problem: Education: ?Goal: Will be free of psychotic symptoms ?Outcome: Progressing ?Goal: Knowledge of the prescribed therapeutic regimen will improve ?Outcome: Progressing ?  ?Problem: Coping: ?Goal: Coping ability will improve ?Outcome: Progressing ?Goal: Will verbalize feelings ?Outcome: Progressing ?  ?Problem: Health Behavior/Discharge Planning: ?Goal: Compliance with prescribed medication regimen will improve ?Outcome: Progressing ?  ?Problem: Nutritional: ?Goal: Ability to achieve adequate nutritional intake will improve ?Outcome: Progressing ?  ?Problem: Role Relationship: ?Goal: Ability to communicate needs accurately will improve ?Outcome: Progressing ?Goal: Ability to interact with others will improve ?Outcome: Progressing ?  ?Problem: Safety: ?Goal: Ability to redirect hostility and anger into socially appropriate behaviors will improve ?Outcome: Progressing ?Goal: Ability to remain free from injury will improve ?Outcome: Progressing ?  ?Problem: Self-Care: ?Goal: Ability to participate in self-care as condition permits will improve ?Outcome: Progressing ?  ?Problem: Self-Concept: ?Goal: Will verbalize positive feelings about self ?Outcome: Progressing ?  ?Problem: Education: ?Goal: Ability to state activities that reduce stress will improve ?Outcome: Progressing ?  ?Problem: Coping: ?Goal: Ability to identify and develop effective coping behavior will improve ?Outcome: Progressing ?  ?Problem:  Self-Concept: ?Goal: Ability to identify factors that promote anxiety will improve ?Outcome: Progressing ?Goal: Level of anxiety will decrease ?Outcome: Progressing ?Goal: Ability to modify response to factors that promote anxiety will improve ?Outcome: Progressing ?  ?

## 2021-04-25 NOTE — Progress Notes (Signed)
Patient ID: Kenneth Jensen, male   DOB: 12-22-1990, 31 y.o.   MRN: [id number] ? ?Discharge Note: ? ?Patient denies SI/HI/AVH at this time. Discharge instructions, AVS, prescriptions, and transition record gone over with patient. Patient agrees to comply with medication management, follow-up visit, and outpatient therapy. Patient belongings returned to patient. Patient questions and concerns addressed and answered. Patient ambulatory off unit. Patient discharged to home with ACT Team. ? ?

## 2021-04-25 NOTE — Progress Notes (Addendum)
Recreation Therapy Notes ? ? ?Date: 04/25/2021 ? ?Time: 10:30 am    ? ?Location: Courtyard   ? ?Behavioral response: Appropriate ? ?Intervention Topic: Wellness    ? ?Discussion/Intervention:  ?Group content today was focused on Wellness. The group defined wellness and some positive ways they make decisions for themselves. Individuals expressed reasons why they neglected any wellness in the past. Patients described ways to improve wellness skills in the future. The group explained what could happen if they did not do any wellness at all. Participants express how bad choices has affected them and others around them. Individual explained the importance of wellness. The group participated in the intervention ?Testing my Wellness? where they had a chance to identify some of their weaknesses and strengths in wellness.  ?Clinical Observations/Feedback: ?Patient came to group and identified music as an activity he likes to participate in for wellness. Individual was social with peers and staff while participating in the intervention.    ?Jearld Hemp LRT/CTRS  ? ? ? ? ? ? ? ? ?Kenneth Jensen ?04/25/2021 12:40 PM ?

## 2021-04-25 NOTE — Discharge Summary (Signed)
Physician Discharge Summary Note ? ?Patient:  Kenneth Jensen is an 31 y.o., male ?MRN:  [id number] ?DOB:  03-06-1990 ?Patient phone:  [phone] (home)  ?Patient address:   ?[address] ?[address] ?[place] [id number],  ?Total Time spent with patient: 30 minutes ? ?Date of Admission:  04/22/2021 ?Date of Discharge: 4/202023 ? ?Reason for Admission: Patient was admitted because of allegations that he had made threatening statements towards his cousin and was continuing to show evidence of psychosis and manic like behavior.  He had been placed under IVC prior to appearing at the emergency room ? ?Principal Problem: Schizoaffective disorder (HCC) ?Discharge Diagnoses: Principal Problem: ?  Schizoaffective disorder (HCC) ?Active Problems: ?  Cannabis use disorder, moderate, dependence (HCC) ?  Prediabetes ? ? ?Past Psychiatric History: Long history of schizophrenia or schizoaffective disorder.  Frequent visitor to the emergency room although not as many admissions as visits to the ER.  Usually he is hyper but not dangerous.  Does have a history of some threatening statements at least.  No known suicidal history.  Has been noncompliant at times with his outpatient ACT team. ? ?Past Medical History:  ?Past Medical History:  ?Diagnosis Date  ? Asthma   ? Bipolar 1 disorder (HCC)   ? Depression   ? Schizophrenia (HCC)   ? Schizotaxia   ? History reviewed. No pertinent surgical history. ?Family History: History reviewed. No pertinent family history. ?Family Psychiatric  History: None reported ?Social History:  ?Social History  ? ?Substance and Sexual Activity  ?Alcohol Use Yes  ? Comment: social  ?   ?Social History  ? ?Substance and Sexual Activity  ?Drug Use Yes  ? Types: Marijuana  ?  ?Social History  ? ?Socioeconomic History  ? Marital status: Single  ?  Spouse name: Not on file  ? Number of children: Not on file  ? Years of education: Not on file  ? Highest education level: Not on file  ?Occupational History  ?  Not on file  ?Tobacco Use  ? Smoking status: Every Day  ?  Packs/day: 0.50  ?  Types: Cigarettes  ? Smokeless tobacco: Never  ?Substance and Sexual Activity  ? Alcohol use: Yes  ?  Comment: social  ? Drug use: Yes  ?  Types: Marijuana  ? Sexual activity: Not Currently  ?Other Topics Concern  ? Not on file  ?Social History Narrative  ? ** Merged History Encounter **  ?    ? ?Social Determinants of Health  ? ?Financial Resource Strain: Not on file  ?Food Insecurity: Not on file  ?Transportation Needs: Not on file  ?Physical Activity: Not on file  ?Stress: Not on file  ?Social Connections: Not on file  ? ? ?Hospital Course: Admitted to the hospital.  Initially argumentative loud and intrusive with frequent demands throughout the day to be discharged accompanied often by grandiose and nonsensical threats.  No physical threats however and no violent behavior.  He was cooperative with his medicine.  We had determined that he had received his Invega injection at the beginning of the month and so we did not repeat that but started him on Depakote and Seroquel which is now at 200 mg at night.  I am giving him a prescription for the Invega injection for the beginning of May as well as for the current medications and have suggested that he would do well to stay on these at discharge.  At the time of discharge he absolutely denies any  threats any suicidal thought or any homicidal thought and he did meet with his ACT team and was appropriate and agreeable.  Much better today.  Able to sit still and have a lucid pleasant conversation. ? ?Physical Findings: ?AIMS:  , ,  ,  ,    ?CIWA:    ?COWS:    ? ?Musculoskeletal: ?Strength & Muscle Tone: within normal limits ?Gait & Station: normal ?Patient leans: N/A ? ? ?Psychiatric Specialty Exam: ? ?Presentation  ?General Appearance: Casual ? ?Eye Contact:Fair ? ?Speech:Clear and Coherent ? ?Speech Volume:Normal ? ?Handedness:Right ? ? ?Mood and Affect   ?Mood:Dysphoric ? ?Affect:Blunt ? ? ?Thought Process  ?Thought Processes:Disorganized ? ?Descriptions of Associations:Intact ? ?Orientation:Full (Time, Place and Person) ? ?Thought Content:Logical ? ?History of Schizophrenia/Schizoaffective disorder:Yes ? ?Duration of Psychotic Symptoms:Greater than six months ? ?Hallucinations:No data recorded ?Ideas of Reference:Delusions ? ?Suicidal Thoughts:No data recorded ?Homicidal Thoughts:No data recorded ? ?Sensorium  ?Memory:Immediate Fair ? ?Judgment:Impaired ? ?Insight:Poor ? ? ?Executive Functions  ?Concentration:Fair ? ?Attention Span:Fair ? ?Recall:Fair ? ?Fund of Knowledge:Fair ? ?Language:Fair ? ? ?Psychomotor Activity  ?Psychomotor Activity:No data recorded ? ?Assets  ?Assets:Resilience ? ? ?Sleep  ?Sleep:No data recorded ? ? ?Physical Exam: ?Physical Exam ?Vitals and nursing note reviewed.  ?Constitutional:   ?   Appearance: Normal appearance.  ?HENT:  ?   Head: Normocephalic and atraumatic.  ?   Mouth/Throat:  ?   Pharynx: Oropharynx is clear.  ?Eyes:  ?   Pupils: Pupils are equal, round, and reactive to light.  ?Cardiovascular:  ?   Rate and Rhythm: Normal rate and regular rhythm.  ?Pulmonary:  ?   Effort: Pulmonary effort is normal.  ?   Breath sounds: Normal breath sounds.  ?Abdominal:  ?   General: Abdomen is flat.  ?   Palpations: Abdomen is soft.  ?Musculoskeletal:     ?   General: Normal range of motion.  ?Skin: ?   General: Skin is warm and dry.  ?Neurological:  ?   General: No focal deficit present.  ?   Mental Status: He is alert. Mental status is at baseline.  ?Psychiatric:     ?   Attention and Perception: Attention normal.     ?   Mood and Affect: Mood normal. Affect is blunt.     ?   Speech: Speech normal.     ?   Behavior: Behavior normal.     ?   Thought Content: Thought content normal.     ?   Cognition and Memory: Cognition normal.     ?   Judgment: Judgment normal.  ? ?Review of Systems  ?Constitutional: Negative.   ?HENT: Negative.    ?Eyes:  Negative.   ?Respiratory: Negative.    ?Cardiovascular: Negative.   ?Gastrointestinal: Negative.   ?Musculoskeletal: Negative.   ?Skin: Negative.   ?Neurological: Negative.   ?Psychiatric/Behavioral: Negative.    ?Blood pressure 122/70, pulse 82, temperature 97.7 ?F (36.5 ?C), temperature source Oral, resp. rate 18, height 5\' 5"  (1.651 m), weight 87.5 kg, SpO2 100 %. Body mass index is 32.12 kg/m?. ? ? ?Social History  ? ?Tobacco Use  ?Smoking Status Every Day  ? Packs/day: 0.50  ? Types: Cigarettes  ?Smokeless Tobacco Never  ? ?Tobacco Cessation:  A prescription for an FDA-approved tobacco cessation medication provided at discharge ? ? ?Blood Alcohol level:  ?Lab Results  ?Component Value Date  ? ETH <10 04/21/2021  ? ETH <10 04/20/2021  ? ? ?Metabolic Disorder Labs:  ?Lab  Results  ?Component Value Date  ? HGBA1C 5.7 (H) 04/23/2021  ? MPG 116.89 04/23/2021  ? MPG 116.89 05/15/2020  ? ?No results found for: PROLACTIN ?Lab Results  ?Component Value Date  ? CHOL 144 04/23/2021  ? TRIG 123 04/23/2021  ? HDL 42 04/23/2021  ? CHOLHDL 3.4 04/23/2021  ? VLDL 25 04/23/2021  ? LDLCALC 77 04/23/2021  ? LDLCALC 67 05/15/2020  ? ? ?See Psychiatric Specialty Exam and Suicide Risk Assessment completed by Attending Physician prior to discharge. ? ?Discharge destination:  Home ? ?Is patient on multiple antipsychotic therapies at discharge:  No   ?Has Patient had three or more failed trials of antipsychotic monotherapy by history:  No ? ?Recommended Plan for Multiple Antipsychotic Therapies: ?Additional reason(s) for multiple antispychotic treatment:  As though he just was not doing as well on a single antipsychotic but is much better now.  Outpatient ACT team obviously should do what they think is clinically appropriate but I see no problem at all with continuing what is affective and if that means the Seroquel and the Invega at seems perfectly reasonable to me. ? ?Discharge Instructions   ? ? Diet - low sodium heart healthy    Complete by: As directed ?  ? Increase activity slowly   Complete by: As directed ?  ? ?  ? ?Allergies as of 04/25/2021   ? ?   Reactions  ? Amoxicillin Swelling  ? Haldol [haloperidol]   ? Pt states "I went crazy"  ? Percocet [oxycodone-acet

## 2021-04-25 NOTE — Care Management Important Message (Signed)
Important Message ? ?Patient Details  ?Name: Kenneth Jensen ?MRN: [id number] ?Date of Birth: [birth year] ? ? ?Medicare Important Message Given:  Yes ? ?Patient informed of right to appeal discharge, provided phone number to [facility]. Patient expressed no interest in appealing discharge at this time. CSW will continue to monitor situation. ? ? ?Durenda Hurt, LCSWA ?04/25/2021, 10:49 AM ?  ?

## 2021-05-05 ENCOUNTER — Other Ambulatory Visit: Payer: Self-pay

## 2021-05-05 ENCOUNTER — Emergency Department
Admission: EM | Admit: 2021-05-05 | Discharge: 2021-05-06 | Disposition: A | Discharge status: Other Acute Inpatient Hospital | Payer: Medicare HMO | Attending: Emergency Medicine | Admitting: Emergency Medicine

## 2021-05-05 DIAGNOSIS — Z20822 Contact with and (suspected) exposure to covid-19: Secondary | ICD-10-CM | POA: Diagnosis not present

## 2021-05-05 DIAGNOSIS — F1290 Cannabis use, unspecified, uncomplicated: Secondary | ICD-10-CM | POA: Diagnosis not present

## 2021-05-05 DIAGNOSIS — J45909 Unspecified asthma, uncomplicated: Secondary | ICD-10-CM | POA: Diagnosis not present

## 2021-05-05 DIAGNOSIS — Z79899 Other long term (current) drug therapy: Secondary | ICD-10-CM | POA: Insufficient documentation

## 2021-05-05 DIAGNOSIS — F3132 Bipolar disorder, current episode depressed, moderate: Secondary | ICD-10-CM | POA: Diagnosis not present

## 2021-05-05 DIAGNOSIS — Z87891 Personal history of nicotine dependence: Secondary | ICD-10-CM | POA: Diagnosis not present

## 2021-05-05 DIAGNOSIS — F250 Schizoaffective disorder, bipolar type: Secondary | ICD-10-CM | POA: Diagnosis not present

## 2021-05-05 DIAGNOSIS — Y900 Blood alcohol level of less than 20 mg/100 ml: Secondary | ICD-10-CM | POA: Diagnosis not present

## 2021-05-05 DIAGNOSIS — R4689 Other symptoms and signs involving appearance and behavior: Secondary | ICD-10-CM | POA: Diagnosis not present

## 2021-05-05 DIAGNOSIS — Z046 Encounter for general psychiatric examination, requested by authority: Secondary | ICD-10-CM | POA: Diagnosis present

## 2021-05-05 DIAGNOSIS — F319 Bipolar disorder, unspecified: Secondary | ICD-10-CM | POA: Diagnosis not present

## 2021-05-05 LAB — COMPREHENSIVE METABOLIC PANEL
ALT: 14 U/L (ref 0–44)
AST: 20 U/L (ref 15–41)
Albumin: 4.1 g/dL (ref 3.5–5.0)
Alkaline Phosphatase: 57 U/L (ref 38–126)
Anion gap: 8 (ref 5–15)
BUN: 13 mg/dL (ref 6–20)
CO2: 27 mmol/L (ref 22–32)
Calcium: 8.7 mg/dL — ABNORMAL LOW (ref 8.9–10.3)
Chloride: 102 mmol/L (ref 98–111)
Creatinine, Ser: 0.76 mg/dL (ref 0.61–1.24)
GFR, Estimated: >60 mL/min (ref >60)
Glucose, Bld: 97 mg/dL (ref 70–99)
Potassium: 3.8 mmol/L (ref 3.5–5.1)
Sodium: 137 mmol/L (ref 135–145)
Total Bilirubin: 0.4 mg/dL (ref 0.3–1.2)
Total Protein: 7.5 g/dL (ref 6.5–8.1)

## 2021-05-05 LAB — CBC WITH DIFFERENTIAL/PLATELET
Abs Immature Granulocytes: 0.04 10*3/uL (ref 0.00–0.07)
Basophils Absolute: 0 10*3/uL (ref 0.0–0.1)
Basophils Relative: 0 %
Eosinophils Absolute: 0.1 10*3/uL (ref 0.0–0.5)
Eosinophils Relative: 1 %
HCT: 40.9 % (ref 39.0–52.0)
Hemoglobin: 13.6 g/dL (ref 13.0–17.0)
Immature Granulocytes: 0 %
Lymphocytes Relative: 43 %
Lymphs Abs: 4.4 10*3/uL — ABNORMAL HIGH (ref 0.7–4.0)
MCH: 29.2 pg (ref 26.0–34.0)
MCHC: 33.3 g/dL (ref 30.0–36.0)
MCV: 87.8 fL (ref 80.0–100.0)
Monocytes Absolute: 0.8 10*3/uL (ref 0.1–1.0)
Monocytes Relative: 8 %
Neutro Abs: 5 10*3/uL (ref 1.7–7.7)
Neutrophils Relative %: 48 %
Platelets: 315 10*3/uL (ref 150–400)
RBC: 4.66 MIL/uL (ref 4.22–5.81)
RDW: 13.6 % (ref 11.5–15.5)
WBC: 10.3 10*3/uL (ref 4.0–10.5)
nRBC: 0 % (ref 0.0–0.2)

## 2021-05-05 LAB — URINALYSIS, ROUTINE W REFLEX MICROSCOPIC
Bilirubin Urine: NEGATIVE
Glucose, UA: NEGATIVE mg/dL
Hgb urine dipstick: NEGATIVE
Ketones, ur: NEGATIVE mg/dL
Leukocytes,Ua: NEGATIVE
Nitrite: NEGATIVE
Protein, ur: NEGATIVE mg/dL
Specific Gravity, Urine: 1.019 (ref 1.005–1.030)
pH: 6 (ref 5.0–8.0)

## 2021-05-05 LAB — URINE DRUG SCREEN, QUALITATIVE (ARMC ONLY)
Amphetamines, Ur Screen: NOT DETECTED
Barbiturates, Ur Screen: NOT DETECTED
Benzodiazepine, Ur Scrn: NOT DETECTED
Cannabinoid 50 Ng, Ur ~~LOC~~: POSITIVE — AB
Cocaine Metabolite,Ur ~~LOC~~: NOT DETECTED
MDMA (Ecstasy)Ur Screen: NOT DETECTED
Methadone Scn, Ur: NOT DETECTED
Opiate, Ur Screen: NOT DETECTED
Phencyclidine (PCP) Ur S: NOT DETECTED
Tricyclic, Ur Screen: NOT DETECTED

## 2021-05-05 LAB — ETHANOL: Alcohol, Ethyl (B): <10 mg/dL (ref <10)

## 2021-05-05 NOTE — ED Notes (Addendum)
Pt belongings placed in belongings bag- Black/colored coat. ?Black pants ?Red and white sneakers ?Black socks x2 ?Black long sleeved shirt ?Key lanyard ?Black face mask ?Orange Underwear ?Black wallet ?Black Cell phone ?Lighter ?Cigar ?Phone charger ?1 condom ? ? ?Pt placed in burgundy  scrub top  and burgundy pants with mesh underwear and grey nonslip socks. ?
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
## 2021-05-06 ENCOUNTER — Other Ambulatory Visit: Payer: Self-pay

## 2021-05-06 ENCOUNTER — Inpatient Hospital Stay
Admission: AD | Admit: 2021-05-06 | Discharge: 2021-05-09 | DRG: 885 | Disposition: A | Discharge status: Home / Self Care | Payer: Medicare HMO | Source: Intra-hospital | Attending: Psychiatry | Admitting: Psychiatry

## 2021-05-06 ENCOUNTER — Encounter: Payer: Self-pay | Admitting: Psychiatric/Mental Health

## 2021-05-06 DIAGNOSIS — F1220 Cannabis dependence, uncomplicated: Secondary | ICD-10-CM | POA: Diagnosis present

## 2021-05-06 DIAGNOSIS — F1290 Cannabis use, unspecified, uncomplicated: Secondary | ICD-10-CM

## 2021-05-06 DIAGNOSIS — Z79899 Other long term (current) drug therapy: Secondary | ICD-10-CM | POA: Diagnosis not present

## 2021-05-06 DIAGNOSIS — F17210 Nicotine dependence, cigarettes, uncomplicated: Secondary | ICD-10-CM | POA: Diagnosis present

## 2021-05-06 DIAGNOSIS — F250 Schizoaffective disorder, bipolar type: Principal | ICD-10-CM | POA: Diagnosis present

## 2021-05-06 DIAGNOSIS — F3132 Bipolar disorder, current episode depressed, moderate: Secondary | ICD-10-CM

## 2021-05-06 DIAGNOSIS — F319 Bipolar disorder, unspecified: Secondary | ICD-10-CM | POA: Diagnosis present

## 2021-05-06 DIAGNOSIS — R4689 Other symptoms and signs involving appearance and behavior: Secondary | ICD-10-CM | POA: Diagnosis not present

## 2021-05-06 DIAGNOSIS — Z20822 Contact with and (suspected) exposure to covid-19: Secondary | ICD-10-CM | POA: Diagnosis present

## 2021-05-06 DIAGNOSIS — Z87891 Personal history of nicotine dependence: Secondary | ICD-10-CM | POA: Diagnosis not present

## 2021-05-06 DIAGNOSIS — R410 Disorientation, unspecified: Secondary | ICD-10-CM | POA: Diagnosis present

## 2021-05-06 DIAGNOSIS — J45909 Unspecified asthma, uncomplicated: Secondary | ICD-10-CM | POA: Diagnosis not present

## 2021-05-06 LAB — RESP PANEL BY RT-PCR (FLU A&B, COVID) ARPGX2
Influenza A by PCR: NEGATIVE
Influenza B by PCR: NEGATIVE
SARS Coronavirus 2 by RT PCR: NEGATIVE

## 2021-05-06 MED ORDER — DIVALPROEX SODIUM ER 500 MG PO TB24
500.0000 mg | ORAL_TABLET | Freq: Three times a day (TID) | ORAL | Status: DC
  Administered 2021-05-06 – 2021-05-09 (×6): 500 mg via ORAL
  Filled 2021-05-06 (×7): qty 1

## 2021-05-06 MED ORDER — NICOTINE 14 MG/24HR TD PT24
14.0000 mg | MEDICATED_PATCH | Freq: Every day | TRANSDERMAL | Status: DC
  Filled 2021-05-06 (×2): qty 1

## 2021-05-06 MED ORDER — TRAZODONE HCL 50 MG PO TABS
50.0000 mg | ORAL_TABLET | Freq: Every evening | ORAL | Status: DC | PRN
  Administered 2021-05-06 – 2021-05-08 (×3): 50 mg via ORAL
  Filled 2021-05-06 (×3): qty 1

## 2021-05-06 MED ORDER — PALIPERIDONE PALMITATE ER 234 MG/1.5ML IM SUSY: 234.0000 mg | PREFILLED_SYRINGE | INTRAMUSCULAR | Status: DC

## 2021-05-06 MED ORDER — NICOTINE 14 MG/24HR TD PT24
14.0000 mg | MEDICATED_PATCH | Freq: Every day | TRANSDERMAL | Status: DC
  Filled 2021-05-06: qty 1

## 2021-05-06 MED ORDER — QUETIAPINE FUMARATE 200 MG PO TABS: 200.0000 mg | ORAL_TABLET | Freq: Every day | ORAL | Status: DC

## 2021-05-06 MED ORDER — DIVALPROEX SODIUM ER 250 MG PO TB24
500.0000 mg | ORAL_TABLET | Freq: Three times a day (TID) | ORAL | Status: DC
  Administered 2021-05-06: 500 mg via ORAL
  Filled 2021-05-06: qty 2

## 2021-05-06 MED ORDER — ALUM & MAG HYDROXIDE-SIMETH 200-200-20 MG/5ML PO SUSP
30.0000 mL | ORAL | Status: DC | PRN
Start: 2021-05-06 — End: 2021-05-09

## 2021-05-06 MED ORDER — MAGNESIUM HYDROXIDE 400 MG/5ML PO SUSP: 30.0000 mL | Freq: Every day | ORAL | Status: DC | PRN

## 2021-05-06 MED ORDER — HYDROXYZINE HCL 25 MG PO TABS
25.0000 mg | ORAL_TABLET | Freq: Three times a day (TID) | ORAL | Status: DC | PRN
  Administered 2021-05-07 – 2021-05-09 (×3): 25 mg via ORAL
  Filled 2021-05-06 (×3): qty 1

## 2021-05-06 MED ORDER — QUETIAPINE FUMARATE 200 MG PO TABS
200.0000 mg | ORAL_TABLET | Freq: Every day | ORAL | Status: DC
  Administered 2021-05-06: 200 mg via ORAL
  Filled 2021-05-06: qty 1
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
## 2021-05-06 NOTE — Tx Team (Signed)
Initial Treatment Plan ?05/06/2021 ?3:28 PM ?Reyne Dumas ?[id number] ? ? ? ?PATIENT STRESSORS: ?Occupational concerns   ?Substance abuse   ? ? ?PATIENT STRENGTHS: ?Communication skills  ?Motivation for treatment/growth  ?Physical Health  ? ? ?PATIENT IDENTIFIED PROBLEMS: ?Paranoia   ?No family support  ?  ?  ?  ?  ?  ?  ?  ?  ? ?DISCHARGE CRITERIA:  ?Ability to meet basic life and health needs ?Adequate post-discharge living arrangements ?Medical problems require only outpatient monitoring ?Verbal commitment to aftercare and medication compliance ? ?PRELIMINARY DISCHARGE PLAN: ?Attend aftercare/continuing care group ?Return to previous living arrangement ? ?PATIENT/FAMILY INVOLVEMENT: ?This treatment plan has been presented to and reviewed with the patient, DAVYD PODGORSKI, and/or family member,   The patient and family have been given the opportunity to ask questions and make suggestions. ? ?Leonarda Salon, RN ?05/06/2021, 3:28 PM ?

## 2021-05-06 NOTE — ED Notes (Signed)
Attempted to call report. Staff reports department acuity to be high at this time. Advised to reattempt report and admission after 7am. ?

## 2021-05-06 NOTE — ED Notes (Signed)
Patient has been accepted to [facility].  ?Patient assigned to room 320 ?Accepting physician is Rashaun [place],NP ?Call report to [phone].  ?Representative was [employer].  ? ?ER Staff is aware of it:  ?Melody ER Secretary  ?Dr. Dolores Frame, ER MD  ?Dorian Patient's Nurse ?Isabelle Course Patient registration    ? ?

## 2021-05-06 NOTE — ED Notes (Signed)
VOL/pending BMU admission after 7AM. ?

## 2021-05-06 NOTE — ED Notes (Signed)
Pt. Is currently sleeping, breakfast is beside pt. Bed.  ?

## 2021-05-07 DIAGNOSIS — F250 Schizoaffective disorder, bipolar type: Secondary | ICD-10-CM | POA: Diagnosis not present

## 2021-05-07 MED ORDER — PALIPERIDONE PALMITATE ER 234 MG/1.5ML IM SUSY
234.0000 mg | PREFILLED_SYRINGE | INTRAMUSCULAR | Status: DC
  Administered 2021-05-07: 234 mg via INTRAMUSCULAR
  Filled 2021-05-07: qty 1.5

## 2021-05-07 MED ORDER — QUETIAPINE FUMARATE 200 MG PO TABS
300.0000 mg | ORAL_TABLET | Freq: Every day | ORAL | Status: DC
  Administered 2021-05-07: 300 mg via ORAL
  Filled 2021-05-07: qty 1

## 2021-05-07 NOTE — Group Note (Signed)
[facility] LCSW Group Therapy Note ? ? ? ?Group Date: 05/07/2021 ?Start Time: 1300 ?End Time: 1400 ? ?Type of Therapy and Topic:  Group Therapy:  Overcoming Obstacles ? ?Participation Level:  [facility] PARTICIPATION LEVEL: Did Not Attend ? ?Mood: ? ?Description of Group:   ?In this group patients will be encouraged to explore what they see as obstacles to their own wellness and recovery. They will be guided to discuss their thoughts, feelings, and behaviors related to these obstacles. The group will process together ways to cope with barriers, with attention given to specific choices patients can make. Each patient will be challenged to identify changes they are motivated to make in order to overcome their obstacles. This group will be process-oriented, with patients participating in exploration of their own experiences as well as giving and receiving support and challenge from other group members. ? ?Therapeutic Goals: ?1. Patient will identify personal and current obstacles as they relate to admission. ?2. Patient will identify barriers that currently interfere with their wellness or overcoming obstacles.  ?3. Patient will identify feelings, thought process and behaviors related to these barriers. ?4. Patient will identify two changes they are willing to make to overcome these obstacles:  ? ? ?Summary of Patient Progress ?Patient did not attend group despite encouraged participation.  ? ? ?Therapeutic Modalities:   ?Cognitive Behavioral Therapy ?Solution Focused Therapy ?Motivational Interviewing ?Relapse Prevention Therapy ? ? ?Marquisa Salih W Chantae Soo, LCSWA ?

## 2021-05-07 NOTE — Plan of Care (Signed)
?  Problem: Education: ?Goal: Knowledge of [facility] Education information/materials will improve ?Outcome: Progressing ?Goal: Verbalization of understanding the information provided will improve ?Outcome: Progressing ?  ?Problem: Health Behavior/Discharge Planning: ?Goal: Compliance with treatment plan for underlying cause of condition will improve ?Outcome: Progressing ?  ?Problem: Coping: ?Goal: Ability to use eye contact when communicating with others will improve ?Outcome: Progressing ?  ?

## 2021-05-07 NOTE — [facility] Suicide Risk Assessment (Signed)
[facility] INPATIENT:  Family/Significant Other Suicide Prevention Education ? ?Suicide Prevention Education:  ?Patient Refusal for Family/Significant Other Suicide Prevention Education: The patient Kenneth Jensen has refused to provide written consent for family/significant other to be provided Family/Significant Other Suicide Prevention Education during admission and/or prior to discharge.  Physician notified. ? ?CSW completed SPE with patient. Discussed potential triggers leading to suicidal ideation in addition to coping skills one might use in order to delay and distract self from self harming behaviors. CSW encouraged patient to utilize emergency services if they felt unable to maintain their safety. SPE flyer provided to patient at this time.  ? ?Durenda Hurt ?05/07/2021, 4:45 PM ?

## 2021-05-07 NOTE — Progress Notes (Signed)
Recreation Therapy Notes ? ? ?Date: 05/07/2021 ? ?Time: 10:35am   ? ?Location: Courtyard   ? ?Behavioral response: N/A ?  ?Intervention Topic: Leisure   ? ?Discussion/Intervention: ?Patient refused to attend group.  ? ?Clinical Observations/Feedback:  ?Patient refused to attend group.  ?  ?Kayvon Mo LRT/CTRS ? ? ? ? ? ? ? ?Shyne Lehrke ?05/07/2021 12:59 PM ?

## 2021-05-07 NOTE — Progress Notes (Signed)
D: Patient alert and oriented. Patient denies pain. Patient denies and depression. At time of assessment patient rated anxiety 10/10 due to irritation caused by another patient. Patient later denied anxiety when reassessed. Patient denies SI/HI/AVH.  ? ?A: Scheduled medications administered to patient, per MD orders. Patient received Gean Birchwood injection as scheduled.Support and encouragement provided to patient.  ?Q15 minute safety checks maintained.  ? ?R: Patient compliant with medication administration and treatment plan. No adverse drug reactions noted. Patient remains safe on the unit at this time.  ?

## 2021-05-07 NOTE — Progress Notes (Signed)
Patient calm and cooperative during assessment denying SI/HI/AVH. Pt endorses anxiety. Pt observed interacting appropriately with staff and peers on the unit. Pt compliant with medication administration per MD orders. Pt being monitored Q 15 minutes for safety per unit protocol. Pt remains safe on the unit ?
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
## 2021-05-08 DIAGNOSIS — F250 Schizoaffective disorder, bipolar type: Secondary | ICD-10-CM | POA: Diagnosis not present

## 2021-05-08 MED ORDER — ZIPRASIDONE MESYLATE 20 MG IM SOLR: 20.0000 mg | Freq: Two times a day (BID) | INTRAMUSCULAR | Status: DC | PRN

## 2021-05-08 MED ORDER — QUETIAPINE FUMARATE 25 MG PO TABS: 50.0000 mg | ORAL_TABLET | Freq: Four times a day (QID) | ORAL | Status: DC | PRN

## 2021-05-08 MED ORDER — QUETIAPINE FUMARATE 200 MG PO TABS
400.0000 mg | ORAL_TABLET | Freq: Every day | ORAL | Status: DC
  Administered 2021-05-08: 400 mg via ORAL
  Filled 2021-05-08: qty 2

## 2021-05-08 NOTE — Progress Notes (Signed)
Recreation Therapy Notes ? ? ?Date: 05/08/2021 ? ?Time: 10:40am   ? ?Location: Courtyard   ? ?Behavioral response: N/A ?  ?Intervention Topic: Communication   ? ?Discussion/Intervention: ?Patient refused to attend group.  ? ?Clinical Observations/Feedback:  ?Patient refused to attend group.  ?  ?Kenneth Jensen LRT/CTRS ? ? ? ? ? ? ? ?Kenneth Jensen ?05/08/2021 12:15 PM ?

## 2021-05-08 NOTE — Progress Notes (Signed)
[facility] ? ?05/08/2021 1:53 PM ?Kenneth Jensen  ?MRN:  [id number] ?Subjective: Follow-up 31 year old man with schizophrenia or schizoaffective disorder.  Patient seen at treatment team.  Continues to insist that somebody is breaking into his house at home.  Also continues to be agitated much of the time.  Demanding to be discharged.  Insight limited.  He did tolerate his injection ?Principal Problem: Schizoaffective disorder, bipolar type (HCC) ?Diagnosis: Principal Problem: ?  Schizoaffective disorder, bipolar type (HCC) ?Active Problems: ?  Cannabis use disorder, moderate, dependence (HCC) ?  Bipolar 1 disorder (HCC) ? ?Total Time spent with patient: 30 minutes ? ?Past Psychiatric History: Past history of schizophrenia with multiple prior hospitalizations ? ?Past Medical History:  ?Past Medical History:  ?Diagnosis Date  ? Asthma   ? Bipolar 1 disorder (HCC)   ? Depression   ? Schizophrenia (HCC)   ? Schizotaxia   ? History reviewed. No pertinent surgical history. ?Family History: History reviewed. No pertinent family history. ?Family Psychiatric  History: See previous ?Social History:  ?Social History  ? ?Substance and Sexual Activity  ?Alcohol Use Yes  ? Comment: social  ?   ?Social History  ? ?Substance and Sexual Activity  ?Drug Use Yes  ? Types: Marijuana  ?  ?Social History  ? ?Socioeconomic History  ? Marital status: Single  ?  Spouse name: Not on file  ? Number of children: Not on file  ? Years of education: Not on file  ? Highest education level: Not on file  ?Occupational History  ? Not on file  ?Tobacco Use  ? Smoking status: Every Day  ?  Packs/day: 0.50  ?  Types: Cigarettes  ? Smokeless tobacco: Never  ?Substance and Sexual Activity  ? Alcohol use: Yes  ?  Comment: social  ? Drug use: Yes  ?  Types: Marijuana  ? Sexual activity: Not Currently  ?Other Topics Concern  ? Not on file  ?Social History Narrative  ? ** Merged History Encounter **  ?    ? ?Social Determinants of Health   ? ?Financial Resource Strain: Not on file  ?Food Insecurity: Not on file  ?Transportation Needs: Not on file  ?Physical Activity: Not on file  ?Stress: Not on file  ?Social Connections: Not on file  ? ?Additional Social History:  ?  ?  ?  ?  ?  ?  ?  ?  ?  ?  ?  ? ?Sleep: Fair ? ?Appetite:  Fair ? ?Current Medications: ?Current Facility-Administered Medications  ?Medication Dose Route Frequency Provider Last Rate Last Admin  ? alum & mag hydroxide-simeth (MAALOX/MYLANTA) 200-200-20 MG/5ML suspension 30 mL  30 mL Oral Q4H PRN Jearld Leschixon, Rashaun M, NP      ? divalproex (DEPAKOTE ER) 24 hr tablet 500 mg  500 mg Oral TID Jearld Leschixon, Rashaun M, NP   500 mg at 05/07/21 1635  ? hydrOXYzine (ATARAX) tablet 25 mg  25 mg Oral TID PRN Jearld Leschixon, Rashaun M, NP   25 mg at 05/07/21 1921  ? magnesium hydroxide (MILK OF MAGNESIA) suspension 30 mL  30 mL Oral Daily PRN Jearld Leschixon, Rashaun M, NP      ? nicotine (NICODERM CQ - dosed in mg/24 hours) patch 14 mg  14 mg Transdermal Daily Dixon, Rashaun M, NP      ? paliperidone (INVEGA SUSTENNA) injection 234 mg  234 mg Intramuscular Q28 days Yassine Brunsman, Jackquline DenmarkJohn T, MD   234 mg at 05/07/21 1301  ? QUEtiapine (SEROQUEL) tablet  400 mg  400 mg Oral QHS Jenniah Bhavsar T, MD      ? QUEtiapine (SEROQUEL) tablet 50 mg  50 mg Oral Q6H PRN Akyah Lagrange T, MD      ? traZODone (DESYREL) tablet 50 mg  50 mg Oral QHS PRN Jearld Lesch, NP   50 mg at 05/07/21 2107  ? ziprasidone (GEODON) injection 20 mg  20 mg Intramuscular Q12H PRN Leeam Cedrone, Jackquline Denmark, MD      ? ? ?Lab Results: No results found for this or any previous visit (from the past 48 hour(s)). ? ?Blood Alcohol level:  ?Lab Results  ?Component Value Date  ? ETH <10 05/05/2021  ? ETH <10 04/21/2021  ? ? ?Metabolic Disorder Labs: ?Lab Results  ?Component Value Date  ? HGBA1C 5.7 (H) 04/23/2021  ? MPG 116.89 04/23/2021  ? MPG 116.89 05/15/2020  ? ?No results found for: PROLACTIN ?Lab Results  ?Component Value Date  ? CHOL 144 04/23/2021  ? TRIG 123 04/23/2021  ? HDL 42  04/23/2021  ? CHOLHDL 3.4 04/23/2021  ? VLDL 25 04/23/2021  ? LDLCALC 77 04/23/2021  ? LDLCALC 67 05/15/2020  ? ? ?Physical Findings: ?AIMS:  , ,  ,  ,    ?CIWA:    ?COWS:    ? ?Musculoskeletal: ?Strength & Muscle Tone: within normal limits ?Gait & Station: normal ?Patient leans: N/A ? ?Psychiatric Specialty Exam: ? ?Presentation  ?General Appearance: Disheveled; Bizarre ? ?Eye Contact:Fleeting ? ?Speech:Garbled ? ?Speech Volume:Increased ? ?Handedness:Right ? ? ?Mood and Affect  ?Mood:Anxious; Dysphoric; Irritable ? ?Affect:Inappropriate; Full Range ? ? ?Thought Process  ?Thought Processes:Disorganized ? ?Descriptions of Associations:Tangential ? ?Orientation:Full (Time, Place and Person) ? ?Thought Content:Delusions; Illogical; Paranoid Ideation ? ?History of Schizophrenia/Schizoaffective disorder:Yes ? ?Duration of Psychotic Symptoms:Greater than six months ? ?Hallucinations:No data recorded ?Ideas of Reference:Paranoia ? ?Suicidal Thoughts:No data recorded ?Homicidal Thoughts:No data recorded ? ?Sensorium  ?Memory:Immediate Poor ? ?Judgment:Impaired ? ?Insight:Lacking ? ? ?Executive Functions  ?Concentration:Poor ? ?Attention Span:Poor ? ?Recall:Poor ? ?Fund of Knowledge:Poor ? ?Language:Poor ? ? ?Psychomotor Activity  ?Psychomotor Activity:No data recorded ? ?Assets  ?Assets:Social Support ? ? ?Sleep  ?Sleep:No data recorded ? ? ?Physical Exam: ?Physical Exam ?Vitals and nursing note reviewed.  ?Constitutional:   ?   Appearance: Normal appearance.  ?HENT:  ?   Head: Normocephalic and atraumatic.  ?   Mouth/Throat:  ?   Pharynx: Oropharynx is clear.  ?Eyes:  ?   Pupils: Pupils are equal, round, and reactive to light.  ?Cardiovascular:  ?   Rate and Rhythm: Normal rate and regular rhythm.  ?Pulmonary:  ?   Effort: Pulmonary effort is normal.  ?   Breath sounds: Normal breath sounds.  ?Abdominal:  ?   General: Abdomen is flat.  ?   Palpations: Abdomen is soft.  ?Musculoskeletal:     ?   General: Normal range of  motion.  ?Skin: ?   General: Skin is warm and dry.  ?Neurological:  ?   General: No focal deficit present.  ?   Mental Status: He is alert. Mental status is at baseline.  ?Psychiatric:     ?   Attention and Perception: He is inattentive.     ?   Mood and Affect: Mood normal. Affect is labile.     ?   Speech: Speech is tangential.     ?   Behavior: Behavior is agitated.     ?   Thought Content: Thought content is paranoid.     ?  Cognition and Memory: Cognition is impaired.  ? ?Review of Systems  ?Constitutional: Negative.   ?HENT: Negative.    ?Eyes: Negative.   ?Respiratory: Negative.    ?Cardiovascular: Negative.   ?Gastrointestinal: Negative.   ?Musculoskeletal: Negative.   ?Skin: Negative.   ?Neurological: Negative.   ?Psychiatric/Behavioral:  The patient is nervous/anxious.   ?Blood pressure 106/63, pulse 75, temperature 97.7 ?F (36.5 ?C), temperature source Oral, resp. rate 18, height 5\' 5"  (1.651 m), weight 90.7 kg, SpO2 100 %. Body mass index is 33.28 kg/m?. ? ? ?Treatment Plan Summary: ?Medication management and Plan patient has gotten his Invega shot.  Seroquel being increased up to 400 mg at night.  Seems a little bit better and might be ready for discharge in a couple days but I think we really need to make sure that we have gotten his confusion and agitation improved before discharge. ? ? , MD ?05/08/2021, 1:53 PM ? ?

## 2021-05-08 NOTE — Plan of Care (Signed)
Pt denies depression, anxiety, SI, HI and AVH. Pt was educated on care plan. Torrie Mayers RN ?Problem: Education: ?Goal: Knowledge of [facility] Education information/materials will improve ?Outcome: Not Progressing ?Goal: Emotional status will improve ?Outcome: Not Progressing ?Goal: Mental status will improve ?Outcome: Not Progressing ?Goal: Verbalization of understanding the information provided will improve ?Outcome: Not Progressing ?  ?Problem: Health Behavior/Discharge Planning: ?Goal: Identification of resources available to assist in meeting health care needs will improve ?Outcome: Not Progressing ?Goal: Compliance with treatment plan for underlying cause of condition will improve ?Outcome: Not Progressing ?  ?Problem: Activity: ?Goal: Will identify at least one activity in which they can participate ?Outcome: Not Progressing ?  ?Problem: Coping: ?Goal: Ability to identify and develop effective coping behavior will improve ?Outcome: Not Progressing ?Goal: Ability to interact with others will improve ?Outcome: Not Progressing ?Goal: Demonstration of participation in decision-making regarding own care will improve ?Outcome: Not Progressing ?Goal: Ability to use eye contact when communicating with others will improve ?Outcome: Not Progressing ?  ?Problem: Self-Concept: ?Goal: Will verbalize positive feelings about self ?Outcome: Not Progressing ?  ?

## 2021-05-08 NOTE — Group Note (Signed)
[facility] LCSW Group Therapy Note ? ? ?Group Date: 05/08/2021 ?Start Time: 1300 ?End Time: 1400 ? ? ?Type of Therapy/Topic:  Group Therapy:  Emotion Regulation ? ?Participation Level:  Did Not Attend  ? ?Mood: ? ?Description of Group:   ? The purpose of this group is to assist patients in learning to regulate negative emotions and experience positive emotions. Patients will be guided to discuss ways in which they have been vulnerable to their negative emotions. These vulnerabilities will be juxtaposed with experiences of positive emotions or situations, and patients challenged to use positive emotions to combat negative ones. Special emphasis will be placed on coping with negative emotions in conflict situations, and patients will process healthy conflict resolution skills. ? ?Therapeutic Goals: ?Patient will identify two positive emotions or experiences to reflect on in order to balance out negative emotions:  ?Patient will label two or more emotions that they find the most difficult to experience:  ?Patient will be able to demonstrate positive conflict resolution skills through discussion or role plays:  ? ?Summary of Patient Progress: ? ? ?X ? ? ? ?Therapeutic Modalities:   ?Cognitive Behavioral Therapy ?Feelings Identification ?Dialectical Behavioral Therapy ? ? ?Navayah Sok J Mitsuye Schrodt, LCSW ?

## 2021-05-08 NOTE — BH IP Treatment Plan (Signed)
Interdisciplinary Treatment and Diagnostic Plan Update ? ?05/08/2021 ?Time of Session: 0930 ?Kenneth Jensen ?MRN: [id number] ? ?Principal Diagnosis: Schizoaffective disorder, bipolar type ([place]) ? ?Secondary Diagnoses: Principal Problem: ?  Schizoaffective disorder, bipolar type ([place]) ?Active Problems: ?  Cannabis use disorder, moderate, dependence ([place]) ?  Bipolar 1 disorder ([place]) ? ? ?Current Medications:  ?Current Facility-Administered Medications  ?Medication Dose Route Frequency Provider Last Rate Last Admin  ? alum & mag hydroxide-simeth (MAALOX/MYLANTA) 200-200-20 MG/5ML suspension 30 mL  30 mL Oral Q4H PRN Deloria Lair, NP      ? divalproex (DEPAKOTE ER) 24 hr tablet 500 mg  500 mg Oral TID Deloria Lair, NP   500 mg at 05/07/21 1635  ? hydrOXYzine (ATARAX) tablet 25 mg  25 mg Oral TID PRN Deloria Lair, NP   25 mg at 05/07/21 1921  ? magnesium hydroxide (MILK OF MAGNESIA) suspension 30 mL  30 mL Oral Daily PRN Deloria Lair, NP      ? nicotine (NICODERM CQ - dosed in mg/24 hours) patch 14 mg  14 mg Transdermal Daily Dixon, Rashaun M, NP      ? paliperidone (INVEGA SUSTENNA) injection 234 mg  234 mg Intramuscular Q28 days Clapacs, Madie Reno, MD   234 mg at 05/07/21 1301  ? QUEtiapine (SEROQUEL) tablet 300 mg  300 mg Oral QHS Clapacs, Madie Reno, MD   300 mg at 05/07/21 2107  ? traZODone (DESYREL) tablet 50 mg  50 mg Oral QHS PRN Deloria Lair, NP   50 mg at 05/07/21 2107  ? ziprasidone (GEODON) injection 20 mg  20 mg Intramuscular Q12H PRN Clapacs, Madie Reno, MD      ? ?PTA Medications: ?Medications Prior to Admission  ?Medication Sig Dispense Refill Last Dose  ? divalproex (DEPAKOTE ER) 500 MG 24 hr tablet Take 1 tablet (500 mg total) by mouth 3 (three) times daily. 90 tablet 1   ? nicotine (NICODERM CQ - DOSED IN MG/24 HOURS) 14 mg/24hr patch Place 1 patch (14 mg total) onto the skin daily. 28 patch 0   ? paliperidone (INVEGA SUSTENNA) 234 MG/1.5ML SUSY injection Inject 234 mg into the muscle every  30 (thirty) days. 1.8 mL 1   ? QUEtiapine (SEROQUEL) 200 MG tablet Take 1 tablet (200 mg total) by mouth at bedtime. 30 tablet 1   ? ? ?Patient Stressors: Occupational concerns   ?Substance abuse   ? ?Patient Strengths: Communication skills  ?Motivation for treatment/growth  ?Physical Health  ? ?Treatment Modalities: Medication Management, Group therapy, Case management,  ?1 to 1 session with clinician, Psychoeducation, Recreational therapy. ? ? ?Physician Treatment Plan for Primary Diagnosis: Schizoaffective disorder, bipolar type ([place]) ?Long Term Goal(s): Improvement in symptoms so as ready for discharge  ? ?Short Term Goals: Compliance with prescribed medications will improve ?Ability to identify triggers associated with substance abuse/mental health issues will improve ?Ability to demonstrate self-control will improve ?Ability to identify and develop effective coping behaviors will improve ? ?Medication Management: Evaluate patient's response, side effects, and tolerance of medication regimen. ? ?Therapeutic Interventions: 1 to 1 sessions, Unit Group sessions and Medication administration. ? ?Evaluation of Outcomes: Not Met ? ?Physician Treatment Plan for Secondary Diagnosis: Principal Problem: ?  Schizoaffective disorder, bipolar type ([place]) ?Active Problems: ?  Cannabis use disorder, moderate, dependence ([place]) ?  Bipolar 1 disorder ([place]) ? ?Long Term Goal(s): Improvement in symptoms so as ready for discharge  ? ?Short Term Goals: Compliance with prescribed medications will improve ?Ability to  identify triggers associated with substance abuse/mental health issues will improve ?Ability to demonstrate self-control will improve ?Ability to identify and develop effective coping behaviors will improve    ? ?Medication Management: Evaluate patient's response, side effects, and tolerance of medication regimen. ? ?Therapeutic Interventions: 1 to 1 sessions, Unit Group sessions and Medication  administration. ? ?Evaluation of Outcomes: Not Met ? ? ?RN Treatment Plan for Primary Diagnosis: Schizoaffective disorder, bipolar type ([place]) ?Long Term Goal(s): Knowledge of disease and therapeutic regimen to maintain health will improve ? ?Short Term Goals: Ability to remain free from injury will improve, Ability to verbalize frustration and anger appropriately will improve, Ability to demonstrate self-control, Ability to participate in decision making will improve, Ability to verbalize feelings will improve, Ability to disclose and discuss suicidal ideas, Ability to identify and develop effective coping behaviors will improve, and Compliance with prescribed medications will improve ? ?Medication Management: RN will administer medications as ordered by provider, will assess and evaluate patient's response and provide education to patient for prescribed medication. RN will report any adverse and/or side effects to prescribing provider. ? ?Therapeutic Interventions: 1 on 1 counseling sessions, Psychoeducation, Medication administration, Evaluate responses to treatment, Monitor vital signs and CBGs as ordered, Perform/monitor CIWA, COWS, AIMS and Fall Risk screenings as ordered, Perform wound care treatments as ordered. ? ?Evaluation of Outcomes: Not Met ? ? ?LCSW Treatment Plan for Primary Diagnosis: Schizoaffective disorder, bipolar type ([place]) ?Long Term Goal(s): Safe transition to appropriate next level of care at discharge, Engage patient in therapeutic group addressing interpersonal concerns. ? ?Short Term Goals: Engage patient in aftercare planning with referrals and resources, Increase social support, Increase ability to appropriately verbalize feelings, Increase emotional regulation, Facilitate acceptance of mental health diagnosis and concerns, Facilitate patient progression through stages of change regarding substance use diagnoses and concerns, and Identify triggers associated with mental health/substance  abuse issues ? ?Therapeutic Interventions: Assess for all discharge needs, 1 to 1 time with Education officer, museum, Explore available resources and support systems, Assess for adequacy in community support network, Educate family and significant other(s) on suicide prevention, Complete Psychosocial Assessment, Interpersonal group therapy. ? ?Evaluation of Outcomes: Not Met ? ? ?Progress in Treatment: ?Attending groups: No. ?Participating in groups: No. ?Taking medication as prescribed: Yes. ?Toleration medication: Yes. ?Family/Significant other contact made: No, will contact:  Patient has declined consent for CSW to reach collateral.  ?Patient understands diagnosis: No. ?Discussing patient identified problems/goals with staff: Yes. ?Medical problems stabilized or resolved: Yes. ?Denies suicidal/homicidal ideation: Yes. ?Issues/concerns per patient self-inventory: Yes. ?Other: none   ? ?New problem(s) identified: Yes, Describe:  Patient states he came here voluntarily and should be able to leave; patient request discharge.  ? ?New Short Term/Long Term Goal(s): Patient to work towards detox, elimination of symptoms of psychosis, medication management for mood stabilization; development of comprehensive mental wellness/sobriety plan. ? ?Patient Goals: States "get my locks changed and go home. They <[facility]> told me "why are you going to the hospital."  ? ?Discharge Plan or Barriers: No psychosocial barriers identified at this time, patient to return to place of residence when appropriate for discharge.  ? ?Reason for Continuation of Hospitalization: Delusions  ?Hallucinations ? ?Estimated Length of Stay: 1-7 days  ? ?Last 3 [place] Suicide Severity Risk Score: ?[facility]mission (Current) from 05/06/2021 in [facility] from 05/05/2021 in [facility] (Discharged) from 04/22/2021 in [facility]  ?C-SSRS RISK CATEGORY No Risk Low  Risk No  Risk  ? ?  ? ? ?Last PHQ 2/9 Scores: ?   ? View : No data to display.  ?  ?  ?  ? ? ?Scribe for Treatment Team: ?Larose Kells ?05/08/2021 ?10:59 AM ? ? ?

## 2021-05-08 NOTE — Progress Notes (Signed)
Recreation Therapy Notes ? ?INPATIENT RECREATION TR PLAN ? ?Patient Details ?Name: Kenneth Jensen ?MRN: [id number] ?DOB: October 15, 1990 ?Today's Date: 05/08/2021 ? ?Rec Therapy Plan ?Is patient appropriate for Therapeutic Recreation?: Yes ?Treatment times per week: at least 3 ?Estimated Length of Stay: 5-7 days ?TR Treatment/Interventions: Group participation (Comment) ? ?Discharge Criteria ?Pt will be discharged from therapy if:: Discharged ?Treatment plan/goals/alternatives discussed and agreed upon by:: Patient/family ? ?Discharge Summary ?  ? ? ?Merlie Noga ?05/08/2021, 3:20 PM ?

## 2021-05-08 NOTE — Progress Notes (Signed)
Recreation Therapy Notes ? ?INPATIENT RECREATION THERAPY ASSESSMENT ? ?Patient Details ?Name: Kenneth Jensen ?MRN: [id number] ?DOB: 07-09-1990 ?Today's Date: 05/08/2021 ?      ?Information Obtained From: ?Patient (Patient refused, explained he wants to go home) ? ?Able to Participate in Assessment/Interview: ?  ? ?Patient Presentation: ?  ? ?Reason for Admission (Per Patient): ?  ? ?Patient Stressors: ?  ? ?Coping Skills:   ?  ? ?Leisure Interests (2+):  ?  ? ?Frequency of Recreation/Participation: ?  ? ?Awareness of Community Resources:  ?  ? ?Community Resources:  ?  ? ?Current Use: ?  ? ?If no, Barriers?: ?  ? ?Expressed Interest in [employer] Information: ?  ? ?[place] of Residence:  ?  ? ?Patient Main Form of Transportation: ?  ? ?Patient Strengths:  ?  ? ?Patient Identified Areas of Improvement:  ?  ? ?Patient Goal for Hospitalization:  ?  ? ?Current SI (including self-harm):  ?  ? ?Current HI:  ?  ? ?Current AVH: ?  ? ?Staff Intervention Plan: ?  ? ?Consent to Intern Participation: ?  ? ?Shamila Lerch ?05/08/2021, 3:20 PM ?

## 2021-05-08 NOTE — Progress Notes (Signed)
Pt was isolative, had a good appetite, irritable at times and non cooperative throughout the day. Pt finally took meds and a PRN at the end of the day.Pt is now calm. Torrie Mayers RN ?

## 2021-05-08 NOTE — Progress Notes (Signed)
?   05/08/21 1020  ?Clinical Encounter Type  ?Visited With Patient  ?Visit Type Initial;Spiritual support;Social support  ?Spiritual Encounters  ?Spiritual Needs Other (Comment) ?(social support)  ? ?Chaplain Burris engaged Pt while rounding on BMU. Chaplain B offered compassionate presence and active listening. Chaplain B offered support to Pt's thoughts of "demons in [place]" which possibly blends elements of delusion with lived experience. Pt also expressed concern of "voodoo" practiced around him on the unit against him. Chaplain B invited Pt to simply reject those fears and concerns, to try to reconnect with a belief in his own inner goodness. Using spiritual language, fears and worries can be thought of as a form of bondage which can be named and cast aside whenever these thoughts arise. Chaplain B invoked the positive aspects of the hospital including meals and relative safety. Chaplain B expressed her care for Pt, her ongoing availability as a listener, and urged Pt to use this time for rest. ?

## 2021-05-09 DIAGNOSIS — F250 Schizoaffective disorder, bipolar type: Secondary | ICD-10-CM | POA: Diagnosis not present

## 2021-05-09 MED ORDER — PALIPERIDONE PALMITATE ER 234 MG/1.5ML IM SUSY: 234.0000 mg | PREFILLED_SYRINGE | INTRAMUSCULAR | 1 refills | Status: AC

## 2021-05-09 MED ORDER — HYDROXYZINE HCL 25 MG PO TABS: 25.0000 mg | ORAL_TABLET | Freq: Three times a day (TID) | ORAL | 1 refills | Status: DC | PRN

## 2021-05-09 MED ORDER — NICOTINE 14 MG/24HR TD PT24: 14.0000 mg | MEDICATED_PATCH | Freq: Every day | TRANSDERMAL | 0 refills | Status: DC

## 2021-05-09 MED ORDER — QUETIAPINE FUMARATE 400 MG PO TABS: 400.0000 mg | ORAL_TABLET | Freq: Every day | ORAL | 1 refills | Status: DC

## 2021-05-09 MED ORDER — DIVALPROEX SODIUM ER 500 MG PO TB24: 500.0000 mg | ORAL_TABLET | Freq: Three times a day (TID) | ORAL | 1 refills | Status: DC

## 2021-05-09 NOTE — Plan of Care (Signed)
Pt denies depression, anxiety, SI, HI and AVH. Pt was educated on care plan and verbalizes understanding. Torrie Mayers RN ?Problem: Education: ?Goal: Knowledge of [facility] Education information/materials will improve ?Outcome: Adequate for Discharge ?Goal: Emotional status will improve ?Outcome: Adequate for Discharge ?Goal: Mental status will improve ?Outcome: Adequate for Discharge ?Goal: Verbalization of understanding the information provided will improve ?Outcome: Adequate for Discharge ?  ?Problem: Health Behavior/Discharge Planning: ?Goal: Identification of resources available to assist in meeting health care needs will improve ?Outcome: Adequate for Discharge ?Goal: Compliance with treatment plan for underlying cause of condition will improve ?Outcome: Adequate for Discharge ?  ?Problem: Activity: ?Goal: Will identify at least one activity in which they can participate ?Outcome: Adequate for Discharge ?  ?Problem: Coping: ?Goal: Ability to identify and develop effective coping behavior will improve ?Outcome: Adequate for Discharge ?Goal: Ability to interact with others will improve ?Outcome: Adequate for Discharge ?Goal: Demonstration of participation in decision-making regarding own care will improve ?Outcome: Adequate for Discharge ?Goal: Ability to use eye contact when communicating with others will improve ?Outcome: Adequate for Discharge ?  ?Problem: Self-Concept: ?Goal: Will verbalize positive feelings about self ?Outcome: Adequate for Discharge ?  ?

## 2021-05-09 NOTE — Discharge Summary (Signed)
Physician Discharge Summary Note ? ?Patient:  Kenneth Jensen is an 31 y.o., male ?MRN:  [id number] ?DOB:  1990-07-31 ?Patient phone:  [phone] (home)  ?Patient address:   ?[address] ?[address] ?[place] [address],  ?Total Time spent with patient: 30 minutes ? ?Date of Admission:  05/06/2021 ?Date of Discharge: 05/09/2021 ? ?Reason for Admission: Presented to the emergency room paranoid believing people were breaking into his home appearing to be hallucinating and disorganized. ? ?Principal Problem: Schizoaffective disorder, bipolar type (HCC) ?Discharge Diagnoses: Principal Problem: ?  Schizoaffective disorder, bipolar type (HCC) ?Active Problems: ?  Cannabis use disorder, moderate, dependence (HCC) ?  Bipolar 1 disorder (HCC) ? ? ?Past Psychiatric History: Past history of longstanding schizophrenia or schizoaffective disorder complicated by cannabis abuse ? ?Past Medical History:  ?Past Medical History:  ?Diagnosis Date  ? Asthma   ? Bipolar 1 disorder (HCC)   ? Depression   ? Schizophrenia (HCC)   ? Schizotaxia   ? History reviewed. No pertinent surgical history. ?Family History: History reviewed. No pertinent family history. ?Family Psychiatric  History: See previous ?Social History:  ?Social History  ? ?Substance and Sexual Activity  ?Alcohol Use Yes  ? Comment: social  ?   ?Social History  ? ?Substance and Sexual Activity  ?Drug Use Yes  ? Types: Marijuana  ?  ?Social History  ? ?Socioeconomic History  ? Marital status: Single  ?  Spouse name: Not on file  ? Number of children: Not on file  ? Years of education: Not on file  ? Highest education level: Not on file  ?Occupational History  ? Not on file  ?Tobacco Use  ? Smoking status: Every Day  ?  Packs/day: 0.50  ?  Types: Cigarettes  ? Smokeless tobacco: Never  ?Substance and Sexual Activity  ? Alcohol use: Yes  ?  Comment: social  ? Drug use: Yes  ?  Types: Marijuana  ? Sexual activity: Not Currently  ?Other Topics Concern  ? Not on file  ?Social  History Narrative  ? ** Merged History Encounter **  ?    ? ?Social Determinants of Health  ? ?Financial Resource Strain: Not on file  ?Food Insecurity: Not on file  ?Transportation Needs: Not on file  ?Physical Activity: Not on file  ?Stress: Not on file  ?Social Connections: Not on file  ? ? ?Hospital Course: Admitted to psychiatric unit.  Patient was given his Invega injection and continued on Seroquel with the dose increased to 400 mg at night.  Continue Depakote.  Initially he was agitated paranoid and disorganized but improved significantly over a couple days.  Denies any suicidal or homicidal thoughts.  Feels comfortable going back home.  Patient has been counseled to avoid substance abuse and to stay compliant with medicine and stay in touch with his ACT team. ? ?Physical Findings: ?AIMS:  , ,  ,  ,    ?CIWA:    ?COWS:    ? ?Musculoskeletal: ?Strength & Muscle Tone: within normal limits ?Gait & Station: normal ?Patient leans: N/A ? ? ?Psychiatric Specialty Exam: ? ?Presentation  ?General Appearance: Disheveled; Bizarre ? ?Eye Contact:Fleeting ? ?Speech:Garbled ? ?Speech Volume:Increased ? ?Handedness:Right ? ? ?Mood and Affect  ?Mood:Anxious; Dysphoric; Irritable ? ?Affect:Inappropriate; Full Range ? ? ?Thought Process  ?Thought Processes:Disorganized ? ?Descriptions of Associations:Tangential ? ?Orientation:Full (Time, Place and Person) ? ?Thought Content:Delusions; Illogical; Paranoid Ideation ? ?History of Schizophrenia/Schizoaffective disorder:Yes ? ?Duration of Psychotic Symptoms:Greater than six months ? ?Hallucinations:No data  recorded ?Ideas of Reference:Paranoia ? ?Suicidal Thoughts:No data recorded ?Homicidal Thoughts:No data recorded ? ?Sensorium  ?Memory:Immediate Poor ? ?Judgment:Impaired ? ?Insight:Lacking ? ? ?Executive Functions  ?Concentration:Poor ? ?Attention Span:Poor ? ?Recall:Poor ? ?Fund of Knowledge:Poor ? ?Language:Poor ? ? ?Psychomotor Activity  ?Psychomotor Activity:No data  recorded ? ?Assets  ?Assets:Social Support ? ? ?Sleep  ?Sleep:No data recorded ? ? ?Physical Exam: ?Physical Exam ?Vitals and nursing note reviewed.  ?Constitutional:   ?   Appearance: Normal appearance.  ?HENT:  ?   Head: Normocephalic and atraumatic.  ?   Mouth/Throat:  ?   Pharynx: Oropharynx is clear.  ?Eyes:  ?   Pupils: Pupils are equal, round, and reactive to light.  ?Cardiovascular:  ?   Rate and Rhythm: Normal rate and regular rhythm.  ?Pulmonary:  ?   Effort: Pulmonary effort is normal.  ?   Breath sounds: Normal breath sounds.  ?Abdominal:  ?   General: Abdomen is flat.  ?   Palpations: Abdomen is soft.  ?Musculoskeletal:     ?   General: Normal range of motion.  ?Skin: ?   General: Skin is warm and dry.  ?Neurological:  ?   General: No focal deficit present.  ?   Mental Status: He is alert. Mental status is at baseline.  ?Psychiatric:     ?   Attention and Perception: Attention normal.     ?   Mood and Affect: Mood normal.     ?   Speech: Speech normal.     ?   Behavior: Behavior normal.     ?   Thought Content: Thought content normal.     ?   Cognition and Memory: Cognition normal.     ?   Judgment: Judgment normal.  ? ?Review of Systems  ?Constitutional: Negative.   ?HENT: Negative.    ?Eyes: Negative.   ?Respiratory: Negative.    ?Cardiovascular: Negative.   ?Gastrointestinal: Negative.   ?Musculoskeletal: Negative.   ?Skin: Negative.   ?Neurological: Negative.   ?Psychiatric/Behavioral: Negative.    ?Blood pressure 111/69, pulse 76, temperature 97.9 ?F (36.6 ?C), resp. rate 18, height 5\' 5"  (1.651 m), weight 90.7 kg, SpO2 100 %. Body mass index is 33.28 kg/m?. ? ? ?Social History  ? ?Tobacco Use  ?Smoking Status Every Day  ? Packs/day: 0.50  ? Types: Cigarettes  ?Smokeless Tobacco Never  ? ?Tobacco Cessation:  A prescription for an FDA-approved tobacco cessation medication provided at discharge ? ? ?Blood Alcohol level:  ?Lab Results  ?Component Value Date  ? ETH <10 05/05/2021  ? ETH <10 04/21/2021   ? ? ?Metabolic Disorder Labs:  ?Lab Results  ?Component Value Date  ? HGBA1C 5.7 (H) 04/23/2021  ? MPG 116.89 04/23/2021  ? MPG 116.89 05/15/2020  ? ?No results found for: PROLACTIN ?Lab Results  ?Component Value Date  ? CHOL 144 04/23/2021  ? TRIG 123 04/23/2021  ? HDL 42 04/23/2021  ? CHOLHDL 3.4 04/23/2021  ? VLDL 25 04/23/2021  ? LDLCALC 77 04/23/2021  ? LDLCALC 67 05/15/2020  ? ? ?See Psychiatric Specialty Exam and Suicide Risk Assessment completed by Attending Physician prior to discharge. ? ?Discharge destination:  Home ? ?Is patient on multiple antipsychotic therapies at discharge:  Yes,   ?Do you recommend tapering to monotherapy for antipsychotics?  No   ?Has Patient had three or more failed trials of antipsychotic monotherapy by history:  Yes,   Antipsychotic medications that previously failed include:   1.  Haloperidol., 2.  Risperdal., and 3.  Invega. ? ?Recommended Plan for Multiple Antipsychotic Therapies: ?.  Patient has well controlled symptoms on medication.  No rational reason to change an effective treatment plan ? ? ?Allergies as of 05/09/2021   ? ?   Reactions  ? Amoxicillin Swelling  ? Haldol [haloperidol]   ? Pt states "I went crazy"  ? Percocet [oxycodone-acetaminophen]   ? Vicodin [hydrocodone-acetaminophen] Itching  ? Vicodin [hydrocodone-acetaminophen]   ? ?  ? ?  ?Medication List  ?  ? ?TAKE these medications   ? ?  Indication  ?divalproex 500 MG 24 hr tablet ?Commonly known as: DEPAKOTE ER ?Take 1 tablet (500 mg total) by mouth 3 (three) times daily. ? Indication: Manic Phase of Manic-Depression, Schizoaffective ?  ?hydrOXYzine 25 MG tablet ?Commonly known as: ATARAX ?Take 1 tablet (25 mg total) by mouth 3 (three) times daily as needed for anxiety. ? Indication: Feeling Anxious ?  ?nicotine 14 mg/24hr patch ?Commonly known as: NICODERM CQ - dosed in mg/24 hours ?Place 1 patch (14 mg total) onto the skin daily. ? Indication: Nicotine Addiction ?  ?paliperidone 234 MG/1.5ML  injection ?Commonly known as: INVEGA SUSTENNA ?Inject 234 mg into the muscle every 28 (twenty-eight) days. ?Start taking on: Jun 04, 2021 ?What changed: when to take this ? Indication: Schizophrenia ?  ?QUEtiapine 400 MG tabl

## 2021-05-09 NOTE — Progress Notes (Signed)
Patient compliant with medications  needy this evening. Pacing in the hall way and making lots of phone calls this evening. Support and encouragement provided. Patient remains safe.  ?

## 2021-05-09 NOTE — Progress Notes (Signed)
?  [facility] Adult Case Management Discharge Plan : ? ?Will you be returning to the same living situation after discharge:  Yes,  pt will return to his home. ?At discharge, do you have transportation home?: Yes,  pt states that he has transportation home. ?Do you have the ability to pay for your medications: Yes,  [employer] Medicare. ? ?Release of information consent forms completed and in the chart;  Patient's signature needed at discharge. ? ?Patient to Follow up at: ? Follow-up Information   ? ? [facility] Follow up.   ?Why: Your ACTT team will see you on Friday, 05/10/2021. ?Contact information: ?[address] ?[place] [address] ?[phone] ? ? ?  ?  ? ?  ?  ? ?  ? ? ?Next level of care provider has access to [facility] ? ?Safety Planning and Suicide Prevention discussed: Yes,  SPE completed with pt. ? ?Have you used any form of tobacco in the last 30 days? (Cigarettes, Smokeless Tobacco, Cigars, and/or Pipes): Yes ? ?Has patient been referred to the Quitline?: Patient refused referral ? ?Patient has been referred for addiction treatment: Yes ? ?Shirl Harris, LCSW ?05/09/2021, 9:19 AM ?

## 2021-05-09 NOTE — Plan of Care (Signed)
?  Problem: Education: ?Goal: Knowledge of [facility] Education information/materials will improve ?05/09/2021 1035 by Kieth Brightly, RN ?Outcome: Adequate for Discharge ?05/09/2021 0905 by Kieth Brightly, RN ?Outcome: Adequate for Discharge ?Goal: Emotional status will improve ?05/09/2021 1035 by Kieth Brightly, RN ?Outcome: Adequate for Discharge ?05/09/2021 0905 by Kieth Brightly, RN ?Outcome: Adequate for Discharge ?Goal: Mental status will improve ?05/09/2021 1035 by Kieth Brightly, RN ?Outcome: Adequate for Discharge ?05/09/2021 0905 by Kieth Brightly, RN ?Outcome: Adequate for Discharge ?Goal: Verbalization of understanding the information provided will improve ?05/09/2021 1035 by Kieth Brightly, RN ?Outcome: Adequate for Discharge ?05/09/2021 0905 by Kieth Brightly, RN ?Outcome: Adequate for Discharge ?  ?Problem: Health Behavior/Discharge Planning: ?Goal: Identification of resources available to assist in meeting health care needs will improve ?05/09/2021 1035 by Kieth Brightly, RN ?Outcome: Adequate for Discharge ?05/09/2021 0905 by Kieth Brightly, RN ?Outcome: Adequate for Discharge ?Goal: Compliance with treatment plan for underlying cause of condition will improve ?05/09/2021 1035 by Kieth Brightly, RN ?Outcome: Adequate for Discharge ?05/09/2021 0905 by Kieth Brightly, RN ?Outcome: Adequate for Discharge ?  ?Problem: Activity: ?Goal: Will identify at least one activity in which they can participate ?05/09/2021 1035 by Kieth Brightly, RN ?Outcome: Adequate for Discharge ?05/09/2021 0905 by Kieth Brightly, RN ?Outcome: Adequate for Discharge ?  ?Problem: Coping: ?Goal: Ability to identify and develop effective coping behavior will improve ?05/09/2021 1035 by Kieth Brightly, RN ?Outcome: Adequate for Discharge ?05/09/2021 0905 by Kieth Brightly, RN ?Outcome: Adequate for Discharge ?Goal: Ability to interact with others will improve ?05/09/2021 1035 by Kieth Brightly, RN ?Outcome: Adequate for Discharge ?05/09/2021  0905 by Kieth Brightly, RN ?Outcome: Adequate for Discharge ?Goal: Demonstration of participation in decision-making regarding own care will improve ?05/09/2021 1035 by Kieth Brightly, RN ?Outcome: Adequate for Discharge ?05/09/2021 0905 by Kieth Brightly, RN ?Outcome: Adequate for Discharge ?Goal: Ability to use eye contact when communicating with others will improve ?05/09/2021 1035 by Kieth Brightly, RN ?Outcome: Adequate for Discharge ?05/09/2021 0905 by Kieth Brightly, RN ?Outcome: Adequate for Discharge ?  ?Problem: Self-Concept: ?Goal: Will verbalize positive feelings about self ?05/09/2021 1035 by Kieth Brightly, RN ?Outcome: Adequate for Discharge ?05/09/2021 0905 by Kieth Brightly, RN ?Outcome: Adequate for Discharge ?  ?

## 2021-05-09 NOTE — Care Management Important Message (Signed)
Important Message ? ?Patient Details  ?Name: Kenneth Jensen ?MRN: [id number] ?Date of Birth: [birth year] ? ? ?Medicare Important Message Given:    ? ?Pt and CSW spoke about ability to appeal discharge. Pt denied any interest in the process and is ready for discharge. ? ? ?Glenis Smoker, LCSW ?05/09/2021, 9:06 AM ? ?

## 2021-05-09 NOTE — BHH Suicide Risk Assessment (Signed)
[facility] Discharge Suicide Risk Assessment ? ? ?Principal Problem: Schizoaffective disorder, bipolar type ([place]) ?Discharge Diagnoses: Principal Problem: ?  Schizoaffective disorder, bipolar type ([place]) ?Active Problems: ?  Cannabis use disorder, moderate, dependence ([place]) ?  Bipolar 1 disorder ([place]) ? ? ?Total Time spent with patient: 30 minutes ? ?Musculoskeletal: ?Strength & Muscle Tone: within normal limits ?Gait & Station: normal ?Patient leans: N/A ? ?Psychiatric Specialty Exam ? ?Presentation  ?General Appearance: Disheveled; Bizarre ? ?Eye Contact:Fleeting ? ?Speech:Garbled ? ?Speech Volume:Increased ? ?Handedness:Right ? ? ?Mood and Affect  ?Mood:Anxious; Dysphoric; Irritable ? ?Duration of Depression Symptoms: Greater than two weeks ? ?Affect:Inappropriate; Full Range ? ? ?Thought Process  ?Thought Processes:Disorganized ? ?Descriptions of Associations:Tangential ? ?Orientation:Full (Time, Place and Person) ? ?Thought Content:Delusions; Illogical; Paranoid Ideation ? ?History of Schizophrenia/Schizoaffective disorder:Yes ? ?Duration of Psychotic Symptoms:Greater than six months ? ?Hallucinations:No data recorded ?Ideas of Reference:Paranoia ? ?Suicidal Thoughts:No data recorded ?Homicidal Thoughts:No data recorded ? ?Sensorium  ?Memory:Immediate Poor ? ?Judgment:Impaired ? ?Insight:Lacking ? ? ?Executive Functions  ?Concentration:Poor ? ?Attention Span:Poor ? ?Recall:Poor ? ?Fund of Knowledge:Poor ? ?Language:Poor ? ? ?Psychomotor Activity  ?Psychomotor Activity:No data recorded ? ?Assets  ?Assets:Social Support ? ? ?Sleep  ?Sleep:No data recorded ? ?Physical Exam: ?Physical Exam ?Vitals and nursing note reviewed.  ?Constitutional:   ?   Appearance: Normal appearance.  ?HENT:  ?   Head: Normocephalic and atraumatic.  ?   Mouth/Throat:  ?   Pharynx: Oropharynx is clear.  ?Eyes:  ?   Pupils: Pupils are equal, round, and reactive to light.  ?Cardiovascular:  ?   Rate and Rhythm: Normal rate and regular rhythm.   ?Pulmonary:  ?   Effort: Pulmonary effort is normal.  ?   Breath sounds: Normal breath sounds.  ?Abdominal:  ?   General: Abdomen is flat.  ?   Palpations: Abdomen is soft.  ?Musculoskeletal:     ?   General: Normal range of motion.  ?Skin: ?   General: Skin is warm and dry.  ?Neurological:  ?   General: No focal deficit present.  ?   Mental Status: He is alert. Mental status is at baseline.  ?Psychiatric:     ?   Attention and Perception: Attention normal.     ?   Mood and Affect: Mood normal.     ?   Speech: Speech normal.     ?   Behavior: Behavior is cooperative.     ?   Thought Content: Thought content normal.     ?   Cognition and Memory: Cognition normal.     ?   Judgment: Judgment normal.  ? ?Review of Systems  ?Constitutional: Negative.   ?HENT: Negative.    ?Eyes: Negative.   ?Respiratory: Negative.    ?Cardiovascular: Negative.   ?Gastrointestinal: Negative.   ?Musculoskeletal: Negative.   ?Skin: Negative.   ?Neurological: Negative.   ?Psychiatric/Behavioral: Negative.    ?Blood pressure 111/69, pulse 76, temperature 97.9 ?F (36.6 ?C), resp. rate 18, height 5\' 5"  (1.651 m), weight 90.7 kg, SpO2 100 %. Body mass index is 33.28 kg/m?. ? ?Mental Status Per Nursing Assessment::   ?On Admission:  NA ? ?Demographic Factors:  ?Male and Living alone ? ?Loss Factors: ?NA ? ?Historical Factors: ?NA ? ?Risk Reduction Factors:   ?Positive therapeutic relationship ? ?Continued Clinical Symptoms:  ?Schizophrenia:   Paranoid or undifferentiated type ? ?Cognitive Features That Contribute To Risk:  ?None   ? ?Suicide Risk:  ?Minimal: No identifiable suicidal ideation.  Patients  presenting with no risk factors but with morbid ruminations; may be classified as minimal risk based on the severity of the depressive symptoms ? ? Follow-up Information   ? ? [facility] Follow up.   ?Why: Your ACTT team will see you on Friday, 05/10/2021. ?Contact information: ?[address] ?[place]  [address] ?[phone] ? ? ?  ?  ? ?  ?  ? ?  ? ? ?Plan Of Care/Follow-up recommendations:  ?Patient denies any suicidal thoughts.  Denies homicidal thoughts.  Denies current hallucinations.  Mood has calm down and he is no longer exhibiting signs of psychosis.  Plan is to continue current medicine and follow up with [facility]. ? ?Alethia Berthold, MD ?05/09/2021, 10:17 AM ?

## 2021-05-09 NOTE — Progress Notes (Signed)
Pt denies SI, HI and AVH. Pt received AVS, prescriptions, discharge packet and all belongings. Pt was educated on d/c plan and verbalizes understanding. Torrie Mayers RN  ?

## 2021-05-09 NOTE — BHH Counselor (Signed)
CSW contacted [facility] ACTT services team lead, Darius [phone]) regarding discharge and aftercare. CSW was informed that ACTT will be following up with pt tomorrow, 05/10/2021 but was uncertain of the time. Dariusz also stated that he would see if someone could check in with pt today as well. He inquired if pt would get prescriptions versus a supply of medication. He was informed that pt would get prescriptions. No other concerns expressed. Contact ended without incident.  ? ?Vilma Meckel. Algis Greenhouse, MSW, LCSW, LCAS ?05/09/2021 9:19 AM ? ?

## 2021-07-09 ENCOUNTER — Emergency Department
Admission: EM | Admit: 2021-07-09 | Discharge: 2021-07-09 | Disposition: A | Discharge status: Other Acute Inpatient Hospital | Payer: Medicare HMO | Attending: Student in an Organized Health Care Education/Training Program | Admitting: Student in an Organized Health Care Education/Training Program

## 2021-07-09 ENCOUNTER — Encounter: Payer: Self-pay | Admitting: Emergency Medicine

## 2021-07-09 ENCOUNTER — Other Ambulatory Visit: Payer: Self-pay

## 2021-07-09 ENCOUNTER — Inpatient Hospital Stay
Admission: AD | Admit: 2021-07-09 | Discharge: 2021-07-12 | DRG: 885 | Disposition: A | Discharge status: Home / Self Care | Payer: Medicare HMO | Source: Intra-hospital | Attending: Psychiatry | Admitting: Psychiatry

## 2021-07-09 DIAGNOSIS — F250 Schizoaffective disorder, bipolar type: Principal | ICD-10-CM | POA: Diagnosis present

## 2021-07-09 DIAGNOSIS — Z20822 Contact with and (suspected) exposure to covid-19: Secondary | ICD-10-CM | POA: Insufficient documentation

## 2021-07-09 DIAGNOSIS — F419 Anxiety disorder, unspecified: Secondary | ICD-10-CM | POA: Diagnosis not present

## 2021-07-09 DIAGNOSIS — F3132 Bipolar disorder, current episode depressed, moderate: Secondary | ICD-10-CM | POA: Diagnosis not present

## 2021-07-09 DIAGNOSIS — R45851 Suicidal ideations: Secondary | ICD-10-CM | POA: Diagnosis not present

## 2021-07-09 DIAGNOSIS — Z046 Encounter for general psychiatric examination, requested by authority: Secondary | ICD-10-CM | POA: Diagnosis not present

## 2021-07-09 DIAGNOSIS — G4700 Insomnia, unspecified: Secondary | ICD-10-CM | POA: Diagnosis not present

## 2021-07-09 DIAGNOSIS — F259 Schizoaffective disorder, unspecified: Secondary | ICD-10-CM | POA: Diagnosis present

## 2021-07-09 DIAGNOSIS — F17200 Nicotine dependence, unspecified, uncomplicated: Secondary | ICD-10-CM | POA: Diagnosis present

## 2021-07-09 DIAGNOSIS — R440 Auditory hallucinations: Secondary | ICD-10-CM | POA: Diagnosis present

## 2021-07-09 DIAGNOSIS — F1290 Cannabis use, unspecified, uncomplicated: Secondary | ICD-10-CM | POA: Diagnosis present

## 2021-07-09 DIAGNOSIS — F17210 Nicotine dependence, cigarettes, uncomplicated: Secondary | ICD-10-CM | POA: Diagnosis present

## 2021-07-09 DIAGNOSIS — F319 Bipolar disorder, unspecified: Secondary | ICD-10-CM | POA: Diagnosis present

## 2021-07-09 DIAGNOSIS — R7303 Prediabetes: Secondary | ICD-10-CM | POA: Diagnosis present

## 2021-07-09 DIAGNOSIS — F3130 Bipolar disorder, current episode depressed, mild or moderate severity, unspecified: Secondary | ICD-10-CM | POA: Diagnosis present

## 2021-07-09 DIAGNOSIS — R4689 Other symptoms and signs involving appearance and behavior: Secondary | ICD-10-CM | POA: Diagnosis present

## 2021-07-09 LAB — COMPREHENSIVE METABOLIC PANEL
ALT: 14 U/L (ref 0–44)
AST: 20 U/L (ref 15–41)
Albumin: 4.6 g/dL (ref 3.5–5.0)
Alkaline Phosphatase: 56 U/L (ref 38–126)
Anion gap: 8 (ref 5–15)
BUN: 12 mg/dL (ref 6–20)
CO2: 27 mmol/L (ref 22–32)
Calcium: 9.2 mg/dL (ref 8.9–10.3)
Chloride: 103 mmol/L (ref 98–111)
Creatinine, Ser: 1 mg/dL (ref 0.61–1.24)
GFR, Estimated: >60 mL/min (ref >60)
Glucose, Bld: 97 mg/dL (ref 70–99)
Potassium: 3.8 mmol/L (ref 3.5–5.1)
Sodium: 138 mmol/L (ref 135–145)
Total Bilirubin: 1 mg/dL (ref 0.3–1.2)
Total Protein: 7.9 g/dL (ref 6.5–8.1)

## 2021-07-09 LAB — CBC
HCT: 43.6 % (ref 39.0–52.0)
Hemoglobin: 14.6 g/dL (ref 13.0–17.0)
MCH: 29.2 pg (ref 26.0–34.0)
MCHC: 33.5 g/dL (ref 30.0–36.0)
MCV: 87.2 fL (ref 80.0–100.0)
Platelets: 335 10*3/uL (ref 150–400)
RBC: 5 MIL/uL (ref 4.22–5.81)
RDW: 13.1 % (ref 11.5–15.5)
WBC: 9.2 10*3/uL (ref 4.0–10.5)
nRBC: 0 % (ref 0.0–0.2)

## 2021-07-09 LAB — ETHANOL: Alcohol, Ethyl (B): <10 mg/dL (ref <10)

## 2021-07-09 LAB — SARS CORONAVIRUS 2 BY RT PCR: SARS Coronavirus 2 by RT PCR: NEGATIVE

## 2021-07-09 LAB — ACETAMINOPHEN LEVEL: Acetaminophen (Tylenol), Serum: <10 ug/mL — ABNORMAL LOW (ref 10–30)

## 2021-07-09 LAB — SALICYLATE LEVEL: Salicylate Lvl: <7 mg/dL — ABNORMAL LOW (ref 7.0–30.0)

## 2021-07-09 MED ORDER — NICOTINE 14 MG/24HR TD PT24
14.0000 mg | MEDICATED_PATCH | Freq: Every day | TRANSDERMAL | Status: DC
  Administered 2021-07-10 – 2021-07-12 (×2): 14 mg via TRANSDERMAL
  Filled 2021-07-09 (×3): qty 1

## 2021-07-09 MED ORDER — ALUM & MAG HYDROXIDE-SIMETH 200-200-20 MG/5ML PO SUSP: 30.0000 mL | ORAL | Status: DC | PRN

## 2021-07-09 MED ORDER — MAGNESIUM HYDROXIDE 400 MG/5ML PO SUSP: 30.0000 mL | Freq: Every day | ORAL | Status: DC | PRN

## 2021-07-09 MED ORDER — LORAZEPAM 2 MG PO TABS
2.0000 mg | ORAL_TABLET | Freq: Once | ORAL | Status: AC
  Administered 2021-07-09: 2 mg via ORAL
  Filled 2021-07-09: qty 1

## 2021-07-09 MED ORDER — TRAZODONE HCL 50 MG PO TABS
50.0000 mg | ORAL_TABLET | Freq: Every evening | ORAL | Status: DC | PRN
  Administered 2021-07-11: 50 mg via ORAL
  Filled 2021-07-09: qty 1

## 2021-07-09 NOTE — ED Notes (Addendum)
1 pair tennis shoes, 1 pair socks, 1 pair pants, 1 belt, 1 pair boxers, 2 black t-shirts .  1 Black wallet, 1 cell phone, 1 lighter, 1 cell phone charger, 1 set of keys,, 1 shiny yellow necklace with pendant with clear shiny stones, 1 bottle of Lybvali, 1 knife given to security.    Pt dressed out by this RN and Shawn, EDT.

## 2021-07-09 NOTE — ED Notes (Signed)
Pt given gingerale and sandwich tray

## 2021-07-09 NOTE — ED Triage Notes (Addendum)
Pt to ED via POV with Darius from [facility] and on patient's ACT team. Per Darius pt was recently switched to Lybalvi on Wednesday, prior to switching was on Depakote and Seroquel and was on Seroquel injection. Per Darius when patient called pt was upset for several reasons including being out of money and his family not caring for him, Darius reports pt reported that he was going to jump out in front of cars, upon his arrival to patient pt was still endorsing wanting to hurt himself.   Pt noted with some manic behavior in triage. Pt endorses recent use of marijuana and molly.

## 2021-07-09 NOTE — ED Notes (Signed)
Report given to [facility] in the BMU.  Patient to be transferred to inpatient unit.

## 2021-07-09 NOTE — ED Provider Notes (Signed)
[facility] Provider Note    Event Date/Time   First MD Initiated Contact with Patient 07/09/21 2116     (approximate)   History   Psychiatric Evaluation   HPI  Kenneth Jensen is a 31 y.o. male   with a history of bipolar disorder presents to the ER from [facility] due to "feeling off."  Per [facility] there have been some recent medication changes and patient was reporting that he was going to jump out in front of cars and endorsing desire to hurt himself.     Physical Exam   Triage Vital Signs: ED Triage Vitals [07/09/21 2041]  Enc Vitals Group     BP 129/85     Pulse Rate 88     Resp 20     Temp 98.7 F (37.1 C)     Temp Source Oral     SpO2 98 %     Weight 199 lb 15.3 oz (90.7 kg)     Height 5\' 5"  (1.651 m)     Head Circumference      Peak Flow      Pain Score 0     Pain Loc      Pain Edu?      Excl. in GC?     Most recent vital signs: Vitals:   07/09/21 2041  BP: 129/85  Pulse: 88  Resp: 20  Temp: 98.7 F (37.1 C)  SpO2: 98%     Constitutional: Alert  Head: Atraumatic. Nose: No congestion/rhinnorhea. Cardiovascular:   well perfused Respiratory: Normal respiratory effort.  Neurologic:  ambulates with steady gail Psychiatric:  cooperative    ED Results / Procedures / Treatments   Labs (all labs ordered are listed, but only abnormal results are displayed) Labs Reviewed  SALICYLATE LEVEL - Abnormal; Notable for the following components:      Result Value   Salicylate Lvl <7.0 (*)    All other components within normal limits  ACETAMINOPHEN LEVEL - Abnormal; Notable for the following components:   Acetaminophen (Tylenol), Serum <10 (*)    All other components within normal limits  SARS CORONAVIRUS 2 BY RT PCR  COMPREHENSIVE METABOLIC PANEL  ETHANOL  CBC     EKG     RADIOLOGY    PROCEDURES:  Critical Care performed: No  Procedures   MEDICATIONS ORDERED IN ED: Medications  LORazepam (ATIVAN) tablet 2 mg  (2 mg Oral Given 07/09/21 2139)     IMPRESSION / MDM / ASSESSMENT AND PLAN / ED COURSE  I reviewed the triage vital signs and the nursing notes.                              Differential diagnosis includes, but is not limited to, Psychosis, delirium, medication effect, noncompliance, polysubstance abuse, Si, Hi, depression  Patient here for evaluation of bipolar disorder with SI.  Laboratory testing was ordered to evaluation for underlying electrolyte derangement or signs of underlying organic pathology to explain today's presentation.  Based on history and physical and laboratory evaluation, it appears that the patient's presentation is 2/2 underlying psychiatric disorder and will require further evaluation and management by inpatient psychiatry.   Psychiatry evaluated patient at bedside and felt patient would benefit from inpatient treatment.     FINAL CLINICAL IMPRESSION(S) / ED DIAGNOSES   Final diagnoses:  Suicidal ideation     Rx / DC Orders   ED Discharge Orders  None        Note:  This document was prepared using Dragon voice recognition software and may include unintentional dictation errors.    Willy Eddy, MD 07/09/21 2325

## 2021-07-09 NOTE — Consult Note (Signed)
[facility] Consult   Reason for Consult: Mental Health Problems Referring Physician: Dr. Quentin Cornwall Patient Identification: Kenneth Jensen MRN:  [id number] Principal Diagnosis: <principal problem not specified> Diagnosis:  Active Problems:   Schizoaffective disorder, bipolar type ([place])   Auditory hallucinations   Tobacco use disorder   Prediabetes   Abnormal behavior   Schizoaffective disorder ([place])   Bipolar affective disorder, currently depressed, moderate ([place])   Marijuana use   Bipolar 1 disorder ([place])   Total Time spent with patient: 1 hour  Subjective: "I am not good right now. It's July 4th and I should be with my family." Kenneth Jensen is a 31 y.o. male patient presented to [facility] ED via POV voluntary. Per the ED triage nurses note, the patient to ED via POV with Kenneth Jensen from [place] and on patient's ACT team. Per Kenneth Jensen pt was recently switched to Lybalvi on Wednesday, prior to switching was on Depakote and Seroquel and was on Seroquel injection. Per Kenneth Jensen when patient called pt was upset for several reasons including being out of money and his family not caring for him, Kenneth Jensen reports pt reported that he was going to jump out in front of cars, upon his arrival to patient pt was still endorsing wanting to hurt himself.  Pt noted with some manic behavior in triage. Pt endorses recent use of marijuana and molly.  The patient said, "They were trying to give me the wrong medication." The patient described that his original medication box was a different color than the recent medication (injection) brought to his home by [place] [facility]. The patient is upset during his assessment. He is also experiencing internal stimuli. The patient is upset that he is not spending time with his family.  This provider saw The patient face-to-face; the chart was reviewed, and consulted with Dr. Quentin Cornwall on 07/09/2021 due to the patient's care. It was discussed with the EDP that the  patient does meet the criteria to be admitted to the psychiatric inpatient unit.  On evaluation, the patient is alert and oriented x 2-3, anxious, irritated but cooperative, and mood-congruent with affect. The patient does appear to be responding to internal and external stimuli. The patient is presenting with some delusional thinking. The patient admits to auditory hallucinations but denies visual hallucinations. The patient denies any suicidal, homicidal, or self-harm ideations. The patient is presenting with some psychotic and paranoid behaviors. During an encounter with the patient, he could not answer most questions appropriately.   HPI:    Past Psychiatric History:  Bipolar 1 disorder ([place]) Depression Schizophrenia ([place]) Schizotaxia   Risk to Self:   Risk to Others:   Prior Inpatient Therapy:   Prior Outpatient Therapy:    Past Medical History:  Past Medical History:  Diagnosis Date   Asthma    Bipolar 1 disorder ([place])    Depression    Schizophrenia ([place])    Schizotaxia    History reviewed. No pertinent surgical history. Family History: History reviewed. No pertinent family history. Family Psychiatric  History:  Social History:  Social History   Substance and Sexual Activity  Alcohol Use Yes   Comment: social     Social History   Substance and Sexual Activity  Drug Use Yes   Types: Marijuana    Social History   Socioeconomic History   Marital status: Single    Spouse name: Not on file   Number of children: Not on file   Years of education: Not on file  Highest education level: Not on file  Occupational History   Not on file  Tobacco Use   Smoking status: Every Day    Packs/day: 0.50    Types: Cigarettes   Smokeless tobacco: Never  Substance and Sexual Activity   Alcohol use: Yes    Comment: social   Drug use: Yes    Types: Marijuana   Sexual activity: Not Currently  Other Topics Concern   Not on file  Social History Narrative   ** Merged History  Encounter **       Social Determinants of Health   Financial Resource Strain: Not on file  Food Insecurity: Not on file  Transportation Needs: Not on file  Physical Activity: Not on file  Stress: Not on file  Social Connections: Not on file   Additional Social History:    Allergies:   Allergies  Allergen Reactions   Amoxicillin Swelling   Haldol [Haloperidol]     Pt states "I went crazy"   Percocet [Oxycodone-Acetaminophen]    Vicodin [Hydrocodone-Acetaminophen] Itching   Vicodin [Hydrocodone-Acetaminophen]     Labs:  Results for orders placed or performed during the hospital encounter of 07/09/21 (from the past 48 hour(s))  Comprehensive metabolic panel     Status: None   Collection Time: 07/09/21  8:56 PM  Result Value Ref Range   Sodium 138 135 - 145 mmol/L   Potassium 3.8 3.5 - 5.1 mmol/L   Chloride 103 98 - 111 mmol/L   CO2 27 22 - 32 mmol/L   Glucose, Bld 97 70 - 99 mg/dL    Comment: Glucose reference range applies only to samples taken after fasting for at least 8 hours.   BUN 12 6 - 20 mg/dL   Creatinine, Ser 4.01 0.61 - 1.24 mg/dL   Calcium 9.2 8.9 - 02.7 mg/dL   Total Protein 7.9 6.5 - 8.1 g/dL   Albumin 4.6 3.5 - 5.0 g/dL   AST 20 15 - 41 U/L   ALT 14 0 - 44 U/L   Alkaline Phosphatase 56 38 - 126 U/L   Total Bilirubin 1.0 0.3 - 1.2 mg/dL   GFR, Estimated >25 >36 mL/min    Comment: (NOTE) Calculated using the CKD-EPI Creatinine Equation (2021)    Anion gap 8 5 - 15    Comment: Performed at [facility], [address] [place], [place] [address]  Ethanol     Status: None   Collection Time: 07/09/21  8:56 PM  Result Value Ref Range   Alcohol, Ethyl (B) <10 <10 mg/dL    Comment: (NOTE) Lowest detectable limit for serum alcohol is 10 mg/dL.  For medical purposes only. Performed at [facility], [address] [place], [place] [address]   Salicylate level     Status: Abnormal   Collection Time: 07/09/21  8:56 PM  Result Value  Ref Range   Salicylate Lvl <7.0 (L) 7.0 - 30.0 mg/dL    Comment: Performed at [facility], [address] [place], [place] [address]  Acetaminophen level     Status: Abnormal   Collection Time: 07/09/21  8:56 PM  Result Value Ref Range   Acetaminophen (Tylenol), Serum <10 (L) 10 - 30 ug/mL    Comment: (NOTE) Therapeutic concentrations vary significantly. A range of 10-30 ug/mL  may be an effective concentration for many patients. However, some  are best treated at concentrations outside of this range. Acetaminophen concentrations >150 ug/mL at 4 hours after ingestion  and >50 ug/mL  at 12 hours after ingestion are often associated with  toxic reactions.  Performed at [facility], [address] [place], [place] [address]   cbc     Status: None   Collection Time: 07/09/21  8:56 PM  Result Value Ref Range   WBC 9.2 4.0 - 10.5 K/uL   RBC 5.00 4.22 - 5.81 MIL/uL   Hemoglobin 14.6 13.0 - 17.0 g/dL   HCT 43.6 39.0 - 52.0 %   MCV 87.2 80.0 - 100.0 fL   MCH 29.2 26.0 - 34.0 pg   MCHC 33.5 30.0 - 36.0 g/dL   RDW 13.1 11.5 - 15.5 %   Platelets 335 150 - 400 K/uL   nRBC 0.0 0.0 - 0.2 %    Comment: Performed at [facility], [address], [place], [place] [address]  SARS Coronavirus 2 by RT PCR (hospital order, performed in [facility] hospital lab) *cepheid single result test* Anterior Nasal Swab     Status: None   Collection Time: 07/09/21  9:22 PM   Specimen: Anterior Nasal Swab  Result Value Ref Range   SARS Coronavirus 2 by RT PCR NEGATIVE NEGATIVE    Comment: (NOTE) SARS-CoV-2 target nucleic acids are NOT DETECTED.  The SARS-CoV-2 RNA is generally detectable in upper and lower respiratory specimens during the acute phase of infection. The lowest concentration of SARS-CoV-2 viral copies this assay can detect is 250 copies / mL. A negative result does not preclude SARS-CoV-2 infection and should not be used as the sole basis for treatment or  other patient management decisions.  A negative result may occur with improper specimen collection / handling, submission of specimen other than nasopharyngeal swab, presence of viral mutation(s) within the areas targeted by this assay, and inadequate number of viral copies (<250 copies / mL). A negative result must be combined with clinical observations, patient history, and epidemiological information.  Fact Sheet for Patients:   https://www.patel.info/  Fact Sheet for Healthcare Providers: https://hall.com/  This test is not yet approved or  cleared by the [place] FDA and has been authorized for detection and/or diagnosis of SARS-CoV-2 by FDA under an Emergency Use Authorization (EUA).  This EUA will remain in effect (meaning this test can be used) for the duration of the COVID-19 declaration under Section 564(b)(1) of the Act, 21 U.S.C. section 360bbb-3(b)(1), unless the authorization is terminated or revoked sooner.  Performed at [facility], [address] [place], [place] [address]     No current facility-administered medications for this encounter.   Current Outpatient Medications  Medication Sig Dispense Refill   divalproex (DEPAKOTE ER) 500 MG 24 hr tablet Take 1 tablet (500 mg total) by mouth 3 (three) times daily. 90 tablet 1   hydrOXYzine (ATARAX) 25 MG tablet Take 1 tablet (25 mg total) by mouth 3 (three) times daily as needed for anxiety. 30 tablet 1   paliperidone (INVEGA SUSTENNA) 234 MG/1.5ML injection Inject 234 mg into the muscle every 28 (twenty-eight) days. 1.8 mL 1   QUEtiapine (SEROQUEL) 400 MG tablet Take 1 tablet (400 mg total) by mouth at bedtime. 30 tablet 1   nicotine (NICODERM CQ - DOSED IN MG/24 HOURS) 14 mg/24hr patch Place 1 patch (14 mg total) onto the skin daily. (Patient not taking: Reported on 07/09/2021) 28 patch 0    Musculoskeletal: Strength & Muscle Tone: within normal  limits Gait & Station: normal Patient leans: N/A Psychiatric Specialty Exam:  Presentation  General Appearance: [place]  Contact:Fleeting  Speech:Pressured  Speech Volume:Normal  Handedness:Right   Mood and Affect  Mood:Euphoric; Depressed; Irritable  Affect:Inappropriate; Full Range   Thought Process  Thought Processes:Disorganized  Descriptions of Associations:Tangential  Orientation:Full (Time, Place and Person)  Thought Content:Illogical; Paranoid Ideation; Obsessions; Delusions  History of Schizophrenia/Schizoaffective disorder:Yes  Duration of Psychotic Symptoms:Greater than six months  Hallucinations:Hallucinations: Auditory Description of Auditory Hallucinations: He does not share  Ideas of Reference:Paranoia  Suicidal Thoughts:Suicidal Thoughts: No  Homicidal Thoughts:Homicidal Thoughts: No   Sensorium  Memory:Immediate Poor; Recent Poor; Remote Poor  Judgment:Impaired  Insight:Lacking   Executive Functions  Concentration:Poor  Attention Span:Poor  Recall:Poor  Fund of Knowledge:Poor  Language:Poor   Psychomotor Activity  Psychomotor Activity:Psychomotor Activity: Normal   Assets  Assets:Social Support; Housing   Sleep  Sleep:Sleep: Poor   Physical Exam: Physical Exam Vitals and nursing note reviewed.  Constitutional:      Appearance: Normal appearance. He is normal weight.  HENT:     Head: Normocephalic and atraumatic.     Right Ear: External ear normal.     Left Ear: External ear normal.     Nose: Nose normal.  Cardiovascular:     Rate and Rhythm: Normal rate.     Pulses: Normal pulses.  Pulmonary:     Effort: Pulmonary effort is normal.  Musculoskeletal:        General: Normal range of motion.     Cervical back: Normal range of motion and neck supple.  Neurological:     General: No focal deficit present.     Mental Status: He is alert and oriented to person, place, and time.  Psychiatric:         Attention and Perception: He perceives auditory hallucinations.        Mood and Affect: Mood is depressed. Affect is blunt, angry and inappropriate.        Speech: Speech is tangential.        Behavior: Behavior is uncooperative and agitated.        Thought Content: Thought content is paranoid and delusional.        Judgment: Judgment is inappropriate.    Review of Systems  Psychiatric/Behavioral:  Positive for depression and hallucinations.   All other systems reviewed and are negative.  Blood pressure 129/85, pulse 88, temperature 98.7 F (37.1 C), temperature source Oral, resp. rate 20, height 5\' 5"  (1.651 m), weight 90.7 kg, SpO2 98 %. Body mass index is 33.27 kg/m.  Treatment Plan Summary: Plan Patient does meet criteria for psychiatric inpatient admission  Disposition: Recommend psychiatric Inpatient admission when medically cleared. Supportive therapy provided about ongoing stressors.  , NP 07/09/2021 10:24 PM

## 2021-07-09 NOTE — ED Notes (Signed)
VOL/Consult ordered/Completed/ Moved to BHU-4

## 2021-07-10 DIAGNOSIS — F250 Schizoaffective disorder, bipolar type: Secondary | ICD-10-CM | POA: Diagnosis not present

## 2021-07-10 MED ORDER — DIVALPROEX SODIUM ER 500 MG PO TB24
500.0000 mg | ORAL_TABLET | Freq: Three times a day (TID) | ORAL | Status: DC
  Administered 2021-07-10 – 2021-07-12 (×5): 500 mg via ORAL
  Filled 2021-07-10 (×5): qty 1

## 2021-07-10 MED ORDER — QUETIAPINE FUMARATE 200 MG PO TABS
400.0000 mg | ORAL_TABLET | Freq: Every day | ORAL | Status: DC
  Administered 2021-07-10 – 2021-07-11 (×2): 400 mg via ORAL
  Filled 2021-07-10 (×2): qty 2

## 2021-07-10 MED ORDER — HYDROXYZINE HCL 25 MG PO TABS
25.0000 mg | ORAL_TABLET | Freq: Three times a day (TID) | ORAL | Status: DC | PRN
  Administered 2021-07-10 – 2021-07-12 (×3): 25 mg via ORAL
  Filled 2021-07-10 (×3): qty 1
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
## 2021-07-10 NOTE — H&P (Cosign Needed Addendum)
Psychiatric Admission Assessment Adult  Patient Identification: Kenneth Jensen MRN:  [id number] Date of Evaluation:  07/10/2021 Chief Complaint:  Bipolar disorder current episode depressed ([place]) [F31.30] Principal Diagnosis: Schizoaffective disorder, bipolar type ([place]) Diagnosis:  Principal Problem:   Schizoaffective disorder, bipolar type ([place])  History of Present Illness:  Client would not meet with treatment team today.  He is irritable and assessment attempted while he was lying in bed.  "I want to be left alone, I'm irritable."  He answered a few questions.  Denies current suicidal ideations.  Later, reassessed and he stated, "I just need my medications.  I lonely."  He lives alone and upset he was not invited to family gatherings, "It was unacceptable."  He does have an ACT team via [facility], this provider spoke with [place] who reports he does not like the new medication and wants his old ones restarted which the client is pleased about.  Discussed discharge plan for Friday.  On admission: "I am not good right now. It's July 4th and I should be with my family." Kenneth Jensen is a 31 y.o. male patient presented to [facility] ED via POV voluntary. Per the ED triage nurses note, the patient to ED via POV with Darius from [facility] and on patient's ACT team. Per Darius pt was recently switched to Lybalvi on Wednesday, prior to switching was on Depakote and Seroquel and was on Seroquel injection. Per Darius when patient called pt was upset for several reasons including being out of money and his family not caring for him, Darius reports pt reported that he was going to jump out in front of cars, upon his arrival to patient pt was still endorsing wanting to hurt himself.   Pt noted with some manic behavior in triage. Pt endorses recent use of marijuana and molly. The patient said, "They were trying to give me the wrong medication." The patient described that his original medication box was a different  color than the recent medication (injection) brought to his home by [facility]. The patient is upset during his assessment. He is also experiencing internal stimuli. The patient is upset that he is not spending time with his family.   This provider saw The patient face-to-face; the chart was reviewed, and consulted with Dr. Quentin Cornwall on 07/09/2021 due to the patient's care. It was discussed with the EDP that the patient does meet the criteria to be admitted to the psychiatric inpatient unit.  On evaluation, the patient is alert and oriented x 2-3, anxious, irritated but cooperative, and mood-congruent with affect. The patient does appear to be responding to internal and external stimuli. The patient is presenting with some delusional thinking. The patient admits to auditory hallucinations but denies visual hallucinations. The patient denies any suicidal, homicidal, or self-harm ideations. The patient is presenting with some psychotic and paranoid behaviors. During an encounter with the patient, he could not answer most questions appropriately.   Associated Signs/Symptoms: Depression Symptoms:  depressed mood, fatigue, anxiety, Duration of Depression Symptoms: Greater than two weeks  (Hypo) Manic Symptoms:   none Anxiety Symptoms:  Excessive Worry, Psychotic Symptoms:   none PTSD Symptoms: NA Total Time spent with patient: 45 minutes  Past Psychiatric History: bipolar disorder  Is the patient at risk to self? No.  Has the patient been a risk to self in the past 6 months? No.  Has the patient been a risk to self within the distant past? No.  Is the patient a risk to others?  No.  Has the patient been a risk to others in the past 6 months? No.  Has the patient been a risk to others within the distant past? No.   Prior Inpatient Therapy:  multiple times Prior Outpatient Therapy:  [facility]  Alcohol Screening: 1. How often do you have a drink containing alcohol?: Never 2. How many drinks  containing alcohol do you have on a typical day when you are drinking?: 1 or 2 3. How often do you have six or more drinks on one occasion?: Never AUDIT-C Score: 0 4. How often during the last year have you found that you were not able to stop drinking once you had started?: Never 5. How often during the last year have you failed to do what was normally expected from you because of drinking?: Never 6. How often during the last year have you needed a first drink in the morning to get yourself going after a heavy drinking session?: Never 7. How often during the last year have you had a feeling of guilt of remorse after drinking?: Never 8. How often during the last year have you been unable to remember what happened the night before because you had been drinking?: Never 9. Have you or someone else been injured as a result of your drinking?: No 10. Has a relative or friend or a doctor or another health worker been concerned about your drinking or suggested you cut down?: No Alcohol Use Disorder Identification Test Final Score (AUDIT): 0 Substance Abuse History in the last 12 months:  Yes.   Consequences of Substance Abuse: NA Previous Psychotropic Medications: Yes  Psychological Evaluations: Yes  Past Medical History:  Past Medical History:  Diagnosis Date   Asthma    Bipolar 1 disorder (HCC)    Depression    Schizophrenia (HCC)    Schizotaxia    History reviewed. No pertinent surgical history. Family History: History reviewed. No pertinent family history. Family Psychiatric  History: unknown Tobacco Screening:   Social History:  Social History   Substance and Sexual Activity  Alcohol Use Yes   Comment: social     Social History   Substance and Sexual Activity  Drug Use Yes   Types: Marijuana    Additional Social History:   Lives alone in his own place, ACT team through [facility]   Allergies:   Allergies  Allergen Reactions   Amoxicillin Swelling   Haldol [Haloperidol]     Pt  states "I went crazy"   Percocet [Oxycodone-Acetaminophen]    Vicodin [Hydrocodone-Acetaminophen] Itching   Vicodin [Hydrocodone-Acetaminophen]    Lab Results:  Results for orders placed or performed during the hospital encounter of 07/09/21 (from the past 48 hour(s))  Comprehensive metabolic panel     Status: None   Collection Time: 07/09/21  8:56 PM  Result Value Ref Range   Sodium 138 135 - 145 mmol/L   Potassium 3.8 3.5 - 5.1 mmol/L   Chloride 103 98 - 111 mmol/L   CO2 27 22 - 32 mmol/L   Glucose, Bld 97 70 - 99 mg/dL    Comment: Glucose reference range applies only to samples taken after fasting for at least 8 hours.   BUN 12 6 - 20 mg/dL   Creatinine, Ser 6.14 0.61 - 1.24 mg/dL   Calcium 9.2 8.9 - 43.1 mg/dL   Total Protein 7.9 6.5 - 8.1 g/dL   Albumin 4.6 3.5 - 5.0 g/dL   AST 20 15 - 41 U/L   ALT 14  0 - 44 U/L   Alkaline Phosphatase 56 38 - 126 U/L   Total Bilirubin 1.0 0.3 - 1.2 mg/dL   GFR, Estimated >60 >60 mL/min    Comment: (NOTE) Calculated using the CKD-EPI Creatinine Equation (2021)    Anion gap 8 5 - 15    Comment: Performed at [facility], [address] [place], [place] [address]  Ethanol     Status: None   Collection Time: 07/09/21  8:56 PM  Result Value Ref Range   Alcohol, Ethyl (B) <10 <10 mg/dL    Comment: (NOTE) Lowest detectable limit for serum alcohol is 10 mg/dL.  For medical purposes only. Performed at [facility], [address] [place], [place] [id number]   Salicylate level     Status: Abnormal   Collection Time: 07/09/21  8:56 PM  Result Value Ref Range   Salicylate Lvl [id number] (L) 7.0 - 30.0 mg/dL    Comment: Performed at [facility], [address] [place], [place] [address]  Acetaminophen level     Status: Abnormal   Collection Time: 07/09/21  8:56 PM  Result Value Ref Range   Acetaminophen (Tylenol), Serum <10 (L) 10 - 30 ug/mL    Comment: (NOTE) Therapeutic concentrations vary significantly. A  range of 10-30 ug/mL  may be an effective concentration for many patients. However, some  are best treated at concentrations outside of this range. Acetaminophen concentrations >150 ug/mL at 4 hours after ingestion  and >50 ug/mL at 12 hours after ingestion are often associated with  toxic reactions.  Performed at [facility], [address] [place], [place] [address]   cbc     Status: None   Collection Time: 07/09/21  8:56 PM  Result Value Ref Range   WBC 9.2 4.0 - 10.5 K/uL   RBC 5.00 4.22 - 5.81 MIL/uL   Hemoglobin 14.6 13.0 - 17.0 g/dL   HCT 43.6 39.0 - 52.0 %   MCV 87.2 80.0 - 100.0 fL   MCH 29.2 26.0 - 34.0 pg   MCHC 33.5 30.0 - 36.0 g/dL   RDW 13.1 11.5 - 15.5 %   Platelets 335 150 - 400 K/uL   nRBC 0.0 0.0 - 0.2 %    Comment: Performed at [facility], [address] [place],  [address]  SARS Coronavirus 2 by RT PCR (hospital order, performed in [facility] hospital lab) *cepheid single result test* Anterior Nasal Swab     Status: None   Collection Time: 07/09/21  9:22 PM   Specimen: Anterior Nasal Swab  Result Value Ref Range   SARS Coronavirus 2 by RT PCR NEGATIVE NEGATIVE    Comment: (NOTE) SARS-CoV-2 target nucleic acids are NOT DETECTED.  The SARS-CoV-2 RNA is generally detectable in upper and lower respiratory specimens during the acute phase of infection. The lowest concentration of SARS-CoV-2 viral copies this assay can detect is 250 copies / mL. A negative result does not preclude SARS-CoV-2 infection and should not be used as the sole basis for treatment or other patient management decisions.  A negative result may occur with improper specimen collection / handling, submission of specimen other than nasopharyngeal swab, presence of viral mutation(s) within the areas targeted by this assay, and inadequate number of viral copies (<250 copies / mL). A negative result must be combined with clinical observations, patient history,  and epidemiological information.  Fact Sheet for Patients:   https://www.patel.info/  Fact Sheet for Healthcare Providers: https://hall.com/  This  test is not yet approved or  cleared by the [place] and has been authorized for detection and/or diagnosis of SARS-CoV-2 by FDA under an Emergency Use Authorization (EUA).  This EUA will remain in effect (meaning this test can be used) for the duration of the COVID-19 declaration under Section 564(b)(1) of the Act, 21 U.S.C. section 360bbb-3(b)(1), unless the authorization is terminated or revoked sooner.  Performed at [facility], [address] [place], [place] [address]     Blood Alcohol level:  Lab Results  Component Value Date   [facility] <10 07/09/2021   ETH <10 05/05/2021    Metabolic Disorder Labs:  Lab Results  Component Value Date   HGBA1C 5.7 (H) 04/23/2021   MPG 116.89 04/23/2021   MPG 116.89 05/15/2020   No results found for: "PROLACTIN" Lab Results  Component Value Date   CHOL 144 04/23/2021   TRIG 123 04/23/2021   HDL 42 04/23/2021   CHOLHDL 3.4 04/23/2021   VLDL 25 04/23/2021   LDLCALC 77 04/23/2021   LDLCALC 67 05/15/2020    Current Medications: Current Facility-Administered Medications  Medication Dose Route Frequency Provider Last Rate Last Admin   alum & mag hydroxide-simeth (MAALOX/MYLANTA) 200-200-20 MG/5ML suspension 30 mL  30 mL Oral Q4H PRN Gillermo Murdoch, NP       divalproex (DEPAKOTE ER) 24 hr tablet 500 mg  500 mg Oral TID Charm Rings, NP       hydrOXYzine (ATARAX) tablet 25 mg  25 mg Oral TID PRN Charm Rings, NP       magnesium hydroxide (MILK OF MAGNESIA) suspension 30 mL  30 mL Oral Daily PRN Gillermo Murdoch, NP       nicotine (NICODERM CQ - dosed in mg/24 hours) patch 14 mg  14 mg Transdermal Daily Gillermo Murdoch, NP       QUEtiapine (SEROQUEL) tablet 400 mg  400 mg Oral QHS Charm Rings, NP        traZODone (DESYREL) tablet 50 mg  50 mg Oral QHS PRN Gillermo Murdoch, NP       PTA Medications: Medications Prior to Admission  Medication Sig Dispense Refill Last Dose   divalproex (DEPAKOTE ER) 500 MG 24 hr tablet Take 1 tablet (500 mg total) by mouth 3 (three) times daily. 90 tablet 1    hydrOXYzine (ATARAX) 25 MG tablet Take 1 tablet (25 mg total) by mouth 3 (three) times daily as needed for anxiety. 30 tablet 1    nicotine (NICODERM CQ - DOSED IN MG/24 HOURS) 14 mg/24hr patch Place 1 patch (14 mg total) onto the skin daily. (Patient not taking: Reported on 07/09/2021) 28 patch 0    paliperidone (INVEGA SUSTENNA) 234 MG/1.5ML injection Inject 234 mg into the muscle every 28 (twenty-eight) days. 1.8 mL 1    QUEtiapine (SEROQUEL) 400 MG tablet Take 1 tablet (400 mg total) by mouth at bedtime. 30 tablet 1     Musculoskeletal: Strength & Muscle Tone: within normal limits Gait & Station: normal Patient leans: N/A            Psychiatric Specialty Exam: Physical Exam Vitals and nursing note reviewed.  Constitutional:      Appearance: Normal appearance.  HENT:     Nose: Nose normal.  Pulmonary:     Effort: Pulmonary effort is normal.  Musculoskeletal:        General: Normal range of motion.     Cervical back: Normal range of motion.  Neurological:  General: No focal deficit present.     Mental Status: He is alert and oriented to person, place, and time.  Psychiatric:        Attention and Perception: Attention and perception normal.        Mood and Affect: Mood is anxious and depressed. Affect is blunt.        Speech: Speech normal.        Behavior: Behavior normal. Behavior is cooperative.        Thought Content: Thought content normal.        Cognition and Memory: Cognition and memory normal.        Judgment: Judgment normal.     Review of Systems  Psychiatric/Behavioral:  Positive for depression and substance abuse. The patient is nervous/anxious.   All other  systems reviewed and are negative.   Blood pressure 132/86, pulse 90, temperature 98.7 F (37.1 C), temperature source Oral, resp. rate 18, height 5\' 5"  (1.651 m), weight 90.7 kg, SpO2 99 %.Body mass index is 33.27 kg/m.  General Appearance: Casual  Eye Contact:  Fair  Speech:  Normal Rate  Volume:  Normal  Mood:  Irritable  Affect:  Blunt  Thought Process:  Coherent  Orientation:  Full (Time, Place, and Person)  Thought Content:  WDL and Logical  Suicidal Thoughts:  No  Homicidal Thoughts:  No  Memory:  Immediate;   Fair Recent;   Fair Remote;   Fair  Judgement:  Fair  Insight:  Fair  Psychomotor Activity:  Decreased  Concentration:  Concentration: Fair and Attention Span: Fair  Recall:  [employer] of Knowledge:  Fair  Language:  Good  Akathisia:  No  Handed:  Right  AIMS (if indicated):     Assets:  Housing Leisure Time Physical Health Resilience Social Support  ADL's:  Intact  Cognition:  WNL  Sleep:          Physical Exam: Physical Exam Vitals and nursing note reviewed.  Constitutional:      Appearance: Normal appearance.  HENT:     Nose: Nose normal.  Pulmonary:     Effort: Pulmonary effort is normal.  Musculoskeletal:        General: Normal range of motion.     Cervical back: Normal range of motion.  Neurological:     General: No focal deficit present.     Mental Status: He is alert and oriented to person, place, and time.  Psychiatric:        Attention and Perception: Attention and perception normal.        Mood and Affect: Mood is anxious and depressed. Affect is blunt.        Speech: Speech normal.        Behavior: Behavior normal. Behavior is cooperative.        Thought Content: Thought content normal.        Cognition and Memory: Cognition and memory normal.        Judgment: Judgment normal.    Review of Systems  Psychiatric/Behavioral:  Positive for depression and substance abuse. The patient is nervous/anxious.   All other systems  reviewed and are negative.  Blood pressure 132/86, pulse 90, temperature 98.7 F (37.1 C), temperature source Oral, resp. rate 18, height 5\' 5"  (1.651 m), weight 90.7 kg, SpO2 99 %. Body mass index is 33.27 kg/m.  Treatment Plan Summary: Daily contact with patient to assess and evaluate symptoms and progress in treatment, Medication management, and Plan : Bipolar affective  disorder, depressed, moderate: Restarted Depakote 500 mg TID Restarted Seroquel 400 mg at bedtime  Anxiety: Restarted hydroxyzine 25 mg TID PRN  Insomnia: Continue Trazodone 50 mg at bedtime PRN  Observation Level/Precautions:  15 minute checks  Laboratory:   completed and evaluated in the ED, stable  Psychotherapy:  individual and group therapy  Medications:  See MAR  Consultations:  NOne  Discharge Concerns:  NOne  Estimated LOS:  1-3 days  Other:     Physician Treatment Plan for Primary Diagnosis: Schizoaffective disorder, bipolar type ([place]) Long Term Goal(s): Improvement in symptoms so as ready for discharge  Short Term Goals: Ability to identify changes in lifestyle to reduce recurrence of condition will improve, Ability to verbalize feelings will improve, Ability to disclose and discuss suicidal ideas, Ability to demonstrate self-control will improve, Ability to identify and develop effective coping behaviors will improve, Ability to maintain clinical measurements within normal limits will improve, Compliance with prescribed medications will improve, and Ability to identify triggers associated with substance abuse/mental health issues will improve  Physician Treatment Plan for Secondary Diagnosis: Principal Problem:   Schizoaffective disorder, bipolar type ([place])  Long Term Goal(s): Improvement in symptoms so as ready for discharge  Short Term Goals: Ability to identify changes in lifestyle to reduce recurrence of condition will improve, Ability to verbalize feelings will improve, Ability to disclose and  discuss suicidal ideas, Ability to demonstrate self-control will improve, Ability to identify and develop effective coping behaviors will improve, Ability to maintain clinical measurements within normal limits will improve, Compliance with prescribed medications will improve, and Ability to identify triggers associated with substance abuse/mental health issues will improve  I certify that inpatient services furnished can reasonably be expected to improve the patient's condition.    Waylan Boga, NP 7/5/20232:07 PM

## 2021-07-10 NOTE — [facility] Suicide Risk Assessment (Addendum)
[facility] Admission Suicide Risk Assessment   Nursing information obtained from:  Patient Demographic factors:  Male Current Mental Status:  Irritable Loss Factors:  None Historical Factors:  Impulsivity Risk Reduction Factors:  Positive therapeutic relationship  Total Time spent with patient: 45 minutes Principal Problem: Schizoaffective disorder, bipolar type (HCC) Diagnosis:  Principal Problem:   Schizoaffective disorder, bipolar type (HCC)  Subjective Data: "I want to be left alone, I'm irritable."  Continued Clinical Symptoms:  Alcohol Use Disorder Identification Test Final Score (AUDIT): 0 The "Alcohol Use Disorders Identification Test", Guidelines for Use in Primary Care, Second Edition.  World Science writer [facility]). Score between 0-7:  no or low risk or alcohol related problems. Score between 8-15:  moderate risk of alcohol related problems. Score between 16-19:  high risk of alcohol related problems. Score 20 or above:  warrants further diagnostic evaluation for alcohol dependence and treatment.  CLINICAL FACTORS:   Schizoaffective disorder, bipolar type:  suicide threat on admission   Musculoskeletal: Strength & Muscle Tone: within normal limits Gait & Station: normal Patient leans: N/A  Psychiatric Specialty Exam: Physical Exam Vitals and nursing note reviewed.  Constitutional:      Appearance: Normal appearance.  HENT:     Head: Normocephalic.     Nose: Nose normal.  Pulmonary:     Effort: Pulmonary effort is normal.  Musculoskeletal:        General: Normal range of motion.     Cervical back: Normal range of motion.  Neurological:     General: No focal deficit present.     Mental Status: He is alert and oriented to person, place, and time.  Psychiatric:        Attention and Perception: Attention and perception normal.        Mood and Affect: Mood is anxious and depressed.        Speech: Speech normal.        Behavior: Behavior normal. Behavior is  cooperative.        Thought Content: Thought content normal.        Cognition and Memory: Cognition and memory normal.        Judgment: Judgment normal.     Review of Systems  Psychiatric/Behavioral:  Positive for depression. The patient is nervous/anxious.   All other systems reviewed and are negative.   Blood pressure 132/86, pulse 90, temperature 98.7 F (37.1 C), temperature source Oral, resp. rate 18, height 5\' 5"  (1.651 m), weight 90.7 kg, SpO2 99 %.Body mass index is 33.27 kg/m.  General Appearance: Casual  Eye Contact:  Fair  Speech:  Normal Rate  Volume:  Normal  Mood:  Irritable  Affect:  Blunt  Thought Process:  Coherent  Orientation:  Full (Time, Place, and Person)  Thought Content:  WDL and Logical  Suicidal Thoughts:  No  Homicidal Thoughts:  No  Memory:  Immediate;   Fair Recent;   Fair Remote;   Fair  Judgement:  Fair  Insight:  Fair  Psychomotor Activity:  Decreased  Concentration:  Concentration: Fair and Attention Span: Fair  Recall:  of Knowledge:  Fair  Language:  Fair  Akathisia:  No  Handed:  Right  AIMS (if indicated):     Assets:  Housing Leisure Time Physical Health Resilience Social Support  ADL's:  Intact  Cognition:  WNL  Sleep:          Physical Exam: Physical Exam Vitals and nursing note reviewed.  Constitutional:  Appearance: Normal appearance.  HENT:     Head: Normocephalic.     Nose: Nose normal.  Pulmonary:     Effort: Pulmonary effort is normal.  Musculoskeletal:        General: Normal range of motion.     Cervical back: Normal range of motion.  Neurological:     General: No focal deficit present.     Mental Status: He is alert and oriented to person, place, and time.  Psychiatric:        Attention and Perception: Attention and perception normal.        Mood and Affect: Mood is anxious and depressed.        Speech: Speech normal.        Behavior: Behavior normal. Behavior is cooperative.         Thought Content: Thought content normal.        Cognition and Memory: Cognition and memory normal.        Judgment: Judgment normal.    Review of Systems  Psychiatric/Behavioral:  Positive for depression. The patient is nervous/anxious.   All other systems reviewed and are negative.  Blood pressure 132/86, pulse 90, temperature 98.7 F (37.1 C), temperature source Oral, resp. rate 18, height 5\' 5"  (1.651 m), weight 90.7 kg, SpO2 99 %. Body mass index is 33.27 kg/m.   COGNITIVE FEATURES THAT CONTRIBUTE TO RISK:  None    SUICIDE RISK:   Minimal: No identifiable suicidal ideation.  Patients presenting with no risk factors but with morbid ruminations; may be classified as minimal risk based on the severity of the depressive symptoms  PLAN OF CARE:  Schizoaffective disorder, bipolar type: Restarted Depakote 500 mg TID Restarted Seroquel 400 mg at bedtime  Anxiety: Restarted hydroxyzine 25 mg TID PRN  Insomnia: Continue Trazodone 50 mg at bedtime PRN  I certify that inpatient services furnished can reasonably be expected to improve the patient's condition.   , NP 07/10/2021, 2:04 PM

## 2021-07-10 NOTE — Plan of Care (Signed)
Patient new to the unit tonight, hasn't had time to progress  Problem: Education: Goal: Knowledge of General Education information will improve Description: Including pain rating scale, medication(s)/side effects and non-pharmacologic comfort measures Outcome: Not Progressing   Problem: Health Behavior/Discharge Planning: Goal: Ability to manage health-related needs will improve Outcome: Not Progressing   Problem: Clinical Measurements: Goal: Ability to maintain clinical measurements within normal limits will improve Outcome: Not Progressing Goal: Will remain free from infection Outcome: Not Progressing Goal: Diagnostic test results will improve Outcome: Not Progressing Goal: Respiratory complications will improve Outcome: Not Progressing Goal: Cardiovascular complication will be avoided Outcome: Not Progressing   Problem: Activity: Goal: Risk for activity intolerance will decrease Outcome: Not Progressing   Problem: Nutrition: Goal: Adequate nutrition will be maintained Outcome: Not Progressing   Problem: Coping: Goal: Level of anxiety will decrease Outcome: Not Progressing   Problem: Elimination: Goal: Will not experience complications related to bowel motility Outcome: Not Progressing Goal: Will not experience complications related to urinary retention Outcome: Not Progressing   Problem: Pain Managment: Goal: General experience of comfort will improve Outcome: Not Progressing   Problem: Safety: Goal: Ability to remain free from injury will improve Outcome: Not Progressing   Problem: Skin Integrity: Goal: Risk for impaired skin integrity will decrease Outcome: Not Progressing   Problem: Education: Goal: Knowledge of [place] General Education information/materials will improve Outcome: Not Progressing Goal: Emotional status will improve Outcome: Not Progressing Goal: Mental status will improve Outcome: Not Progressing Goal: Verbalization of  understanding the information provided will improve Outcome: Not Progressing   Problem: Safety: Goal: Periods of time without injury will increase Outcome: Not Progressing   Problem: Education: Goal: Ability to make informed decisions regarding treatment will improve Outcome: Not Progressing   Problem: Medication: Goal: Compliance with prescribed medication regimen will improve Outcome: Not Progressing   Problem: Health Behavior/Discharge Planning: Goal: Ability to manage health-related needs will improve Outcome: Not Progressing   Problem: Clinical Measurements: Goal: Ability to maintain clinical measurements within normal limits will improve Outcome: Not Progressing

## 2021-07-10 NOTE — [facility] Group Notes (Signed)
[facility] Group Notes:  (Nursing/MHT/Case Management/Adjunct)  Date:  07/10/2021  Time:  9:08 AM  Type of Therapy:   Community meeting  Participation Level:  Did Not Attend   Lynelle Smoke [facility] 07/10/2021, 9:08 AM

## 2021-07-10 NOTE — BH IP Treatment Plan (Signed)
Interdisciplinary Treatment and Diagnostic Plan Update  07/10/2021 Time of Session: 9:30 AM Kenneth Jensen MRN: [id number]  Principal Diagnosis: Bipolar disorder current episode depressed [facility])  Secondary Diagnoses: Principal Problem:   Bipolar disorder current episode depressed ([place])   Current Medications:  Current Facility-Administered Medications  Medication Dose Route Frequency Provider Last Rate Last Admin   alum & mag hydroxide-simeth (MAALOX/MYLANTA) 200-200-20 MG/5ML suspension 30 mL  30 mL Oral Q4H PRN Caroline Sauger, NP       magnesium hydroxide (MILK OF MAGNESIA) suspension 30 mL  30 mL Oral Daily PRN Caroline Sauger, NP       nicotine (NICODERM CQ - dosed in mg/24 hours) patch 14 mg  14 mg Transdermal Daily Caroline Sauger, NP       traZODone (DESYREL) tablet 50 mg  50 mg Oral QHS PRN Caroline Sauger, NP       PTA Medications: Medications Prior to Admission  Medication Sig Dispense Refill Last Dose   divalproex (DEPAKOTE ER) 500 MG 24 hr tablet Take 1 tablet (500 mg total) by mouth 3 (three) times daily. 90 tablet 1    hydrOXYzine (ATARAX) 25 MG tablet Take 1 tablet (25 mg total) by mouth 3 (three) times daily as needed for anxiety. 30 tablet 1    nicotine (NICODERM CQ - DOSED IN MG/24 HOURS) 14 mg/24hr patch Place 1 patch (14 mg total) onto the skin daily. (Patient not taking: Reported on 07/09/2021) 28 patch 0    paliperidone (INVEGA SUSTENNA) 234 MG/1.5ML injection Inject 234 mg into the muscle every 28 (twenty-eight) days. 1.8 mL 1    QUEtiapine (SEROQUEL) 400 MG tablet Take 1 tablet (400 mg total) by mouth at bedtime. 30 tablet 1     Patient Stressors:    Patient Strengths:    Treatment Modalities: Medication Management, Group therapy, Case management,  1 to 1 session with clinician, Psychoeducation, Recreational therapy.   Physician Treatment Plan for Primary Diagnosis: Bipolar disorder current episode depressed ([place]) Long Term Goal(s):      Short Term Goals:    Medication Management: Evaluate patient's response, side effects, and tolerance of medication regimen.  Therapeutic Interventions: 1 to 1 sessions, Unit Group sessions and Medication administration.  Evaluation of Outcomes: Not Met  Physician Treatment Plan for Secondary Diagnosis: Principal Problem:   Bipolar disorder current episode depressed ([place])  Long Term Goal(s):     Short Term Goals:       Medication Management: Evaluate patient's response, side effects, and tolerance of medication regimen.  Therapeutic Interventions: 1 to 1 sessions, Unit Group sessions and Medication administration.  Evaluation of Outcomes: Not Met   RN Treatment Plan for Primary Diagnosis: Bipolar disorder current episode depressed ([place]) Long Term Goal(s): Knowledge of disease and therapeutic regimen to maintain health will improve  Short Term Goals: Ability to remain free from injury will improve, Ability to verbalize frustration and anger appropriately will improve, Ability to demonstrate self-control, Ability to participate in decision making will improve, Ability to verbalize feelings will improve, Ability to disclose and discuss suicidal ideas, Ability to identify and develop effective coping behaviors will improve, and Compliance with prescribed medications will improve  Medication Management: RN will administer medications as ordered by provider, will assess and evaluate patient's response and provide education to patient for prescribed medication. RN will report any adverse and/or side effects to prescribing provider.  Therapeutic Interventions: 1 on 1 counseling sessions, Psychoeducation, Medication administration, Evaluate responses to treatment, Monitor vital signs and CBGs as ordered, Perform/monitor  CIWA, COWS, AIMS and Fall Risk screenings as ordered, Perform wound care treatments as ordered.  Evaluation of Outcomes: Not Met   LCSW Treatment Plan for Primary  Diagnosis: Bipolar disorder current episode depressed ([place]) Long Term Goal(s): Safe transition to appropriate next level of care at discharge, Engage patient in therapeutic group addressing interpersonal concerns.  Short Term Goals: Engage patient in aftercare planning with referrals and resources, Increase social support, Increase ability to appropriately verbalize feelings, Increase emotional regulation, Facilitate acceptance of mental health diagnosis and concerns, Facilitate patient progression through stages of change regarding substance use diagnoses and concerns, Identify triggers associated with mental health/substance abuse issues, and Increase skills for wellness and recovery  Therapeutic Interventions: Assess for all discharge needs, 1 to 1 time with Social worker, Explore available resources and support systems, Assess for adequacy in community support network, Educate family and significant other(s) on suicide prevention, Complete Psychosocial Assessment, Interpersonal group therapy.  Evaluation of Outcomes: Not Met   Progress in Treatment: Attending groups: No. Participating in groups: No. Taking medication as prescribed: Yes. Toleration medication: Yes. Family/Significant other contact made: No, will contact:  when given permission.  Patient understands diagnosis: No. Discussing patient identified problems/goals with staff: No. Medical problems stabilized or resolved: Yes. Denies suicidal/homicidal ideation: No. Issues/concerns per patient self-inventory: No. Other: none.  New problem(s) identified: No, Describe:  none identified.  New Short Term/Long Term Goal(s): detox, elimination of symptoms of psychosis, medication management for mood stabilization; elimination of SI thoughts; development of comprehensive mental wellness/sobriety plan.  Patient Goals:  Pt declined to participate in the treatment team meeting after being told to leave room after walking into another pt's  meeting.  Discharge Plan or Barriers: CSW will assist pt with development of an appropriate aftercare/discharge plan.    Reason for Continuation of Hospitalization: Aggression Medication stabilization Suicidal ideation  Estimated Length of Stay: 1-7 days  Last 3 [place] Suicide Severity Risk Score: Flowsheet Row Admission (Current) from 07/09/2021 in [place] Most recent reading at 07/10/2021 12:04 AM ED from 07/09/2021 in [place] Most recent reading at 07/09/2021  8:43 PM Admission (Discharged) from 05/06/2021 in [place] Most recent reading at 05/06/2021  3:32 PM  C-SSRS RISK CATEGORY Low Risk High Risk No Risk       Last PHQ 2/9 Scores:     No data to display          Scribe for Treatment Team: Shirl Harris, LCSW 07/10/2021 10:04 AM

## 2021-07-10 NOTE — Progress Notes (Signed)
Recreation Therapy Notes   Date: 07/10/2021  Time: 10:15 am     Location: Craft room     Behavioral response: N/A   Intervention Topic: Time Management    Discussion/Intervention: Patient refused to attend group.   Clinical Observations/Feedback:  Patient refused to attend group.    Annita Ratliff LRT/CTRS        Seriyah Collison 07/10/2021 12:47 PM

## 2021-07-10 NOTE — Progress Notes (Signed)
Pt sitting on bed eating evening snack; calm, cooperative. He states "I feel weird because when I went to sleep I felt some weird stuff. I felt like I pissed myself and I didn't understand why my pants were wet because I didn't use the bathroom." He denies currently denies pain, SI/HI, anxiety and depression. He endorses AVH and says "I'm not saying nothing but I am" when asked about visual hallucinations. He states that the voices "They tried to keep me sleep all day. When I rest and wake up, I hear more voices. I'm tired of hearing voices. They say you're hearing things, you're hearing people. Something ain't right." He says "it's okay; it's good" when asked about his appetite. He describes his sleep as "poor" and reports that he has trouble falling and staying asleep. He also says "I wake up at odd times." No acute distress noted.

## 2021-07-10 NOTE — Plan of Care (Signed)
Patient stayed in bed most of the shift. Patient appears sad and irritable this after noon states " let me go home. No one care for me here also." Patient verbalized paranoid delusion states " someone open my back door." Patient denies SI,HI and AVH. Compliant with medications.Appetite and energy level good. Support and encouragement given.

## 2021-07-10 NOTE — Progress Notes (Signed)
Patient calm and pleasant during assessment. Pt denies HI/AVH. Pt endorses passive SI stating his family didn't want to take him to on vacation with them. Pt stated he wanted to walk into traffic because he was upset. Pt stated to this writer that he feels safe here and doesn't want to kill himself now. Pt given support. Pt didn't have any medication scheduled tonight and hasn't requested anything PRN. Pt given support and encouragement to be active in his treatment plan. Pt being monitored Q 15 minutes for safety per unit protocol. Pt remains safe on the unit.

## 2021-07-10 NOTE — Group Note (Signed)
[facility] LCSW Group Therapy Note   Group Date: 07/10/2021 Start Time: 1300 End Time: 1400   Type of Therapy/Topic:  Group Therapy:  Emotion Regulation  Participation Level:  Did Not Attend    Description of Group:    The purpose of this group is to assist patients in learning to regulate negative emotions and experience positive emotions. Patients will be guided to discuss ways in which they have been vulnerable to their negative emotions. These vulnerabilities will be juxtaposed with experiences of positive emotions or situations, and patients challenged to use positive emotions to combat negative ones. Special emphasis will be placed on coping with negative emotions in conflict situations, and patients will process healthy conflict resolution skills.  Therapeutic Goals: Patient will identify two positive emotions or experiences to reflect on in order to balance out negative emotions:  Patient will label two or more emotions that they find the most difficult to experience:  Patient will be able to demonstrate positive conflict resolution skills through discussion or role plays:   Summary of Patient Progress: Pt did not attend group despite invitation.   Therapeutic Modalities:   Cognitive Behavioral Therapy Feelings Identification Dialectical Behavioral Therapy   Ascencion Stegner R Yanil Dawe, LCSW 

## 2021-07-11 DIAGNOSIS — F250 Schizoaffective disorder, bipolar type: Secondary | ICD-10-CM | POA: Diagnosis not present

## 2021-07-11 NOTE — Progress Notes (Signed)
Recreation Therapy Notes   Date: 07/11/2021  Time: 10:20 am    Location: Courtyard       Behavioral response: N/A   Intervention Topic: Wellness   Discussion/Intervention: Patient refused to attend group.   Clinical Observations/Feedback:  Patient refused to attend group.    Nyrah Demos LRT/CTRS        Dannika Hilgeman 07/11/2021 11:15 AM

## 2021-07-11 NOTE — Group Note (Signed)
[facility] LCSW Group Therapy Note   Group Date: 07/11/2021 Start Time: 1300 End Time: 1400   Type of Therapy/Topic:  Group Therapy:  Balance in Life  Participation Level:  Active   Description of Group:    This group will address the concept of balance and how it feels and looks when one is unbalanced. Patients will be encouraged to process areas in their lives that are out of balance, and identify reasons for remaining unbalanced. Facilitators will guide patients utilizing problem- solving interventions to address and correct the stressor making their life unbalanced. Understanding and applying boundaries will be explored and addressed for obtaining  and maintaining a balanced life. Patients will be encouraged to explore ways to assertively make their unbalanced needs known to significant others in their lives, using other group members and facilitator for support and feedback.  Therapeutic Goals: Patient will identify two or more emotions or situations they have that consume much of in their lives. Patient will identify signs/triggers that life has become out of balance:  Patient will identify two ways to set boundaries in order to achieve balance in their lives:  Patient will demonstrate ability to communicate their needs through discussion and/or role plays  Summary of Patient Progress: Patient was present for the entirety of the group. He shared that his mother was someone who caused him to become off balanced. Pt went on to explain that his mother was incarcerated when he was younger but then when he got older she began to lean into her faith (Christianity). He stated that when she would chastise him, he would become really upset because he remembers her as she used to be. Pt stated that rather than fussing at her/cursing her, he just walks away. He shared that he struggles with dealing with his mother how she is now because of how she was in the past. Pt identified church and music as ways that  he maintains balance, sharing that he likes to rap. He endorsed need to take some time, count to ten, and think about things before reacting. Pt was off topic at time and rambled but was easily redirected. He was appropriate during group and appeared open/receptive to feedback and comments from facilitator and peers.   Therapeutic Modalities:   Cognitive Behavioral Therapy Solution-Focused Therapy Assertiveness Training   Glenis Smoker, LCSW

## 2021-07-11 NOTE — [facility] Group Notes (Signed)
[facility] Group Notes:  (Nursing/MHT/Case Management/Adjunct)  Date:  07/11/2021  Time:  10:35 AM  Type of Therapy:   Community Meeting  Participation Level:  Did Not Attend   Lynelle Smoke [facility] 07/11/2021, 10:35 AM

## 2021-07-11 NOTE — Plan of Care (Signed)
Patient more visible in the milieu. Stated that he could sleep good and his depression and anxiety is better. Patient got irritable and cursed at staff for not letting him use the phone on off phone hours. Patient redirectable and back to his room. Patient denies SI,HI and AVH at this time. Compliant with medications. Attended group.Appetite and energy level good. Support and encouragement given.

## 2021-07-11 NOTE — Progress Notes (Signed)
[facility] MD Progress Note  07/11/2021 10:49 AM FUE CERVENKA  MRN:  [id number]  Subjective:  "I finally got some sleep last night."  His depression is moderate with no suicidal ideations.  Denies anxiety.  Auditory hallucinations are low, not command type.  Denies side effects from his medications.  He does not like his ACT team but cannot identify the reasons.  This provider let him know that he could manage this after discharge.  Discharge planned for tomorrow as he is back on his medications and improving.  Staff knows him well and reports he is at his baseline at this time.  Principal Problem: Schizoaffective disorder, bipolar type (HCC) Diagnosis: Principal Problem:   Schizoaffective disorder, bipolar type (HCC)  Total Time spent with patient: 30 minutes  Past Psychiatric History: schizoaffective disorder  Past Medical History:  Past Medical History:  Diagnosis Date   Asthma    Bipolar 1 disorder (HCC)    Depression    Schizophrenia (HCC)    Schizotaxia    History reviewed. No pertinent surgical history. Family History: History reviewed. No pertinent family history. Family Psychiatric  History: none Social History:  Social History   Substance and Sexual Activity  Alcohol Use Yes   Comment: social     Social History   Substance and Sexual Activity  Drug Use Yes   Types: Marijuana    Social History   Socioeconomic History   Marital status: Single    Spouse name: Not on file   Number of children: Not on file   Years of education: Not on file   Highest education level: Not on file  Occupational History   Not on file  Tobacco Use   Smoking status: Every Day    Packs/day: 0.50    Types: Cigarettes   Smokeless tobacco: Never  Substance and Sexual Activity   Alcohol use: Yes    Comment: social   Drug use: Yes    Types: Marijuana   Sexual activity: Not Currently  Other Topics Concern   Not on file  Social History Narrative   ** Merged History Encounter **        Social Determinants of Health   Financial Resource Strain: Not on file  Food Insecurity: Not on file  Transportation Needs: Not on file  Physical Activity: Not on file  Stress: Not on file  Social Connections: Not on file   Additional Social History:                         Sleep: Good  Appetite:  Good  Current Medications: Current Facility-Administered Medications  Medication Dose Route Frequency Provider Last Rate Last Admin   alum & mag hydroxide-simeth (MAALOX/MYLANTA) 200-200-20 MG/5ML suspension 30 mL  30 mL Oral Q4H PRN Gillermo Murdoch, NP       divalproex (DEPAKOTE ER) 24 hr tablet 500 mg  500 mg Oral TID Charm Rings, NP   500 mg at 07/11/21 0827   hydrOXYzine (ATARAX) tablet 25 mg  25 mg Oral TID PRN Charm Rings, NP   25 mg at 07/11/21 9937   magnesium hydroxide (MILK OF MAGNESIA) suspension 30 mL  30 mL Oral Daily PRN Gillermo Murdoch, NP       nicotine (NICODERM CQ - dosed in mg/24 hours) patch 14 mg  14 mg Transdermal Daily Gillermo Murdoch, NP   14 mg at 07/10/21 1504   QUEtiapine (SEROQUEL) tablet 400 mg  400 mg Oral QHS  Charm Rings, NP   400 mg at 07/10/21 2129   traZODone (DESYREL) tablet 50 mg  50 mg Oral QHS PRN Gillermo Murdoch, NP        Lab Results:  Results for orders placed or performed during the hospital encounter of 07/09/21 (from the past 48 hour(s))  Comprehensive metabolic panel     Status: None   Collection Time: 07/09/21  8:56 PM  Result Value Ref Range   Sodium 138 135 - 145 mmol/L   Potassium 3.8 3.5 - 5.1 mmol/L   Chloride 103 98 - 111 mmol/L   CO2 27 22 - 32 mmol/L   Glucose, Bld 97 70 - 99 mg/dL    Comment: Glucose reference range applies only to samples taken after fasting for at least 8 hours.   BUN 12 6 - 20 mg/dL   Creatinine, Ser 3.81 0.61 - 1.24 mg/dL   Calcium 9.2 8.9 - 82.9 mg/dL   Total Protein 7.9 6.5 - 8.1 g/dL   Albumin 4.6 3.5 - 5.0 g/dL   AST 20 15 - 41 U/L   ALT 14 0 - 44 U/L    Alkaline Phosphatase 56 38 - 126 U/L   Total Bilirubin 1.0 0.3 - 1.2 mg/dL   GFR, Estimated >93 >71 mL/min    Comment: (NOTE) Calculated using the CKD-EPI Creatinine Equation (2021)    Anion gap 8 5 - 15    Comment: Performed at [facility], [address], [place], [place] [address]  Ethanol     Status: None   Collection Time: 07/09/21  8:56 PM  Result Value Ref Range   Alcohol, Ethyl (B) <10 <10 mg/dL    Comment: (NOTE) Lowest detectable limit for serum alcohol is 10 mg/dL.  For medical purposes only. Performed at [facility], [address] [place], [place] [address]   Salicylate level     Status: Abnormal   Collection Time: 07/09/21  8:56 PM  Result Value Ref Range   Salicylate Lvl <7.0 (L) 7.0 - 30.0 mg/dL    Comment: Performed at [facility], [address] [place], [place] [address]  Acetaminophen level     Status: Abnormal   Collection Time: 07/09/21  8:56 PM  Result Value Ref Range   Acetaminophen (Tylenol), Serum <10 (L) 10 - 30 ug/mL    Comment: (NOTE) Therapeutic concentrations vary significantly. A range of 10-30 ug/mL  may be an effective concentration for many patients. However, some  are best treated at concentrations outside of this range. Acetaminophen concentrations >150 ug/mL at 4 hours after ingestion  and >50 ug/mL at 12 hours after ingestion are often associated with  toxic reactions.  Performed at [facility], [address] [place], [place] [address]   cbc     Status: None   Collection Time: 07/09/21  8:56 PM  Result Value Ref Range   WBC 9.2 4.0 - 10.5 K/uL   RBC 5.00 4.22 - 5.81 MIL/uL   Hemoglobin 14.6 13.0 - 17.0 g/dL   HCT 77.8 24.2 - 35.3 %   MCV 87.2 80.0 - 100.0 fL   MCH 29.2 26.0 - 34.0 pg   MCHC 33.5 30.0 - 36.0 g/dL   RDW 61.4 43.1 - 54.0 %   Platelets 335 150 - 400 K/uL   nRBC 0.0 0.0 - 0.2 %    Comment: Performed at [facility], [address], [place], [place]  [address]  SARS Coronavirus 2 by RT PCR (hospital  order, performed in [facility] hospital lab) *cepheid single result test* Anterior Nasal Swab     Status: None   Collection Time: 07/09/21  9:22 PM   Specimen: Anterior Nasal Swab  Result Value Ref Range   SARS Coronavirus 2 by RT PCR NEGATIVE NEGATIVE    Comment: (NOTE) SARS-CoV-2 target nucleic acids are NOT DETECTED.  The SARS-CoV-2 RNA is generally detectable in upper and lower respiratory specimens during the acute phase of infection. The lowest concentration of SARS-CoV-2 viral copies this assay can detect is 250 copies / mL. A negative result does not preclude SARS-CoV-2 infection and should not be used as the sole basis for treatment or other patient management decisions.  A negative result may occur with improper specimen collection / handling, submission of specimen other than nasopharyngeal swab, presence of viral mutation(s) within the areas targeted by this assay, and inadequate number of viral copies (<250 copies / mL). A negative result must be combined with clinical observations, patient history, and epidemiological information.  Fact Sheet for Patients:   RoadLapTop.co.za  Fact Sheet for Healthcare Providers: http://kim-miller.com/  This test is not yet approved or  cleared by the [place] FDA and has been authorized for detection and/or diagnosis of SARS-CoV-2 by FDA under an Emergency Use Authorization (EUA).  This EUA will remain in effect (meaning this test can be used) for the duration of the COVID-19 declaration under Section 564(b)(1) of the Act, 21 U.S.C. section 360bbb-3(b)(1), unless the authorization is terminated or revoked sooner.  Performed at [facility], [address] [place], [place] [address]     Blood Alcohol level:  Lab Results  Component Value Date   [facility] <10 07/09/2021   ETH <10 05/05/2021    Metabolic Disorder Labs: Lab  Results  Component Value Date   HGBA1C 5.7 (H) 04/23/2021   MPG 116.89 04/23/2021   MPG 116.89 05/15/2020   No results found for: "PROLACTIN" Lab Results  Component Value Date   CHOL 144 04/23/2021   TRIG 123 04/23/2021   HDL 42 04/23/2021   CHOLHDL 3.4 04/23/2021   VLDL 25 04/23/2021   LDLCALC 77 04/23/2021   LDLCALC 67 05/15/2020    Physical Findings: AIMS:  , ,  ,  ,    CIWA:    COWS:     Musculoskeletal: Strength & Muscle Tone: within normal limits Gait & Station: normal Patient leans: N/A  Psychiatric Specialty Exam: Physical Exam Vitals and nursing note reviewed.  Constitutional:      Appearance: Normal appearance.  HENT:     Head: Normocephalic.     Nose: Nose normal.  Pulmonary:     Effort: Pulmonary effort is normal.  Musculoskeletal:        General: Normal range of motion.     Cervical back: Normal range of motion.  Neurological:     General: No focal deficit present.     Mental Status: He is alert and oriented to person, place, and time.  Psychiatric:        Attention and Perception: He perceives auditory hallucinations.        Mood and Affect: Mood is depressed.        Speech: Speech normal.        Behavior: Behavior normal. Behavior is cooperative.        Thought Content: Thought content normal.        Cognition and Memory: Cognition and memory normal.        Judgment: Judgment normal.  Review of Systems  Psychiatric/Behavioral:  Positive for depression and hallucinations.   All other systems reviewed and are negative.   Blood pressure 132/86, pulse 90, temperature 98.7 F (37.1 C), temperature source Oral, resp. rate 18, height 5\' 5"  (1.651 m), weight 90.7 kg, SpO2 99 %.Body mass index is 33.27 kg/m.  General Appearance: Casual  Eye Contact:  Good  Speech:  Normal Rate  Volume:  Normal  Mood:  Depressed  Affect:  Blunt  Thought Process:  Coherent and Descriptions of Associations: Intact  Orientation:  Full (Time, Place, and Person)   Thought Content:  Hallucinations: Auditory  Suicidal Thoughts:  No  Homicidal Thoughts:  No  Memory:  Immediate;   Good Recent;   Good Remote;   Good  Judgement:  Fair  Insight:  Fair  Psychomotor Activity:  Normal  Concentration:  Concentration: Fair and Attention Span: Fair  Recall:  of Knowledge:  Fair  Language:  Good  Akathisia:  No  Handed:  Right  AIMS (if indicated):     Assets:  Housing Leisure Time Physical Health Resilience Social Support  ADL's:  Intact  Cognition:  WNL  Sleep:         Physical Exam: Physical Exam Vitals and nursing note reviewed.  Constitutional:      Appearance: Normal appearance.  HENT:     Head: Normocephalic.     Nose: Nose normal.  Pulmonary:     Effort: Pulmonary effort is normal.  Musculoskeletal:        General: Normal range of motion.     Cervical back: Normal range of motion.  Neurological:     General: No focal deficit present.     Mental Status: He is alert and oriented to person, place, and time.  Psychiatric:        Attention and Perception: He perceives auditory hallucinations.        Mood and Affect: Mood is depressed.        Speech: Speech normal.        Behavior: Behavior normal. Behavior is cooperative.        Thought Content: Thought content normal.        Cognition and Memory: Cognition and memory normal.        Judgment: Judgment normal.    Review of Systems  Psychiatric/Behavioral:  Positive for depression and hallucinations.   All other systems reviewed and are negative.  Blood pressure 132/86, pulse 90, temperature 98.7 F (37.1 C), temperature source Oral, resp. rate 18, height 5\' 5"  (1.651 m), weight 90.7 kg, SpO2 99 %. Body mass index is 33.27 kg/m.   Treatment Plan Summary: Daily contact with patient to assess and evaluate symptoms and progress in treatment, Medication management, and Plan : Bipolar affective disorder, depressed, moderate: Restarted Depakote 500 mg TID Restarted  Seroquel 400 mg at bedtime   Anxiety: Restarted hydroxyzine 25 mg TID PRN   Insomnia: Continue Trazodone 50 mg at bedtime PRN  [employer], NP 07/11/2021, 10:49 AM

## 2021-07-12 DIAGNOSIS — F250 Schizoaffective disorder, bipolar type: Secondary | ICD-10-CM | POA: Diagnosis not present

## 2021-07-12 MED ORDER — TRAZODONE HCL 50 MG PO TABS: 50.0000 mg | ORAL_TABLET | Freq: Every evening | ORAL | 0 refills | Status: DC | PRN

## 2021-07-12 NOTE — Progress Notes (Signed)
Recreation Therapy Notes   Date: 07/12/2021   Time: 11:00 am      Location: Craft room      Behavioral response: N/A   Intervention Topic: Decision Making    Discussion/Intervention: Patient refused to attend group.    Clinical Observations/Feedback:  Patient refused to attend group.    Nithila Sumners LRT/CTRS

## 2021-07-12 NOTE — Discharge Summary (Signed)
Physician Discharge Summary Note  Patient:  Kenneth Jensen is an 31 y.o., male MRN:  [id number] DOB:  11/02/1990 Patient phone:  [phone] (home)  Patient address:   [address] [place] [address],  Total Time spent with patient: 45 minutes  Date of Admission:  07/09/2021 Date of Discharge: 07/12/2021  Reason for Admission:  suicidal ideations  Principal Problem: Schizoaffective disorder, bipolar type [facility]) Discharge Diagnoses: Principal Problem:   Schizoaffective disorder, bipolar type ([place])   Past Psychiatric History: schizoaffective disorder, bipolar type  Past Medical History:  Past Medical History:  Diagnosis Date   Asthma    Bipolar 1 disorder ([place])    Depression    Schizophrenia ([place])    Schizotaxia    History reviewed. No pertinent surgical history. Family History: History reviewed. No pertinent family history. Family Psychiatric  History: none Social History:  Social History   Substance and Sexual Activity  Alcohol Use Yes   Comment: social     Social History   Substance and Sexual Activity  Drug Use Yes   Types: Marijuana    Social History   Socioeconomic History   Marital status: Single    Spouse name: Not on file   Number of children: Not on file   Years of education: Not on file   Highest education level: Not on file  Occupational History   Not on file  Tobacco Use   Smoking status: Every Day    Packs/day: 0.50    Types: Cigarettes   Smokeless tobacco: Never  Substance and Sexual Activity   Alcohol use: Yes    Comment: social   Drug use: Yes    Types: Marijuana   Sexual activity: Not Currently  Other Topics Concern   Not on file  Social History Narrative   ** Merged History Encounter **       Social Determinants of Health   Financial Resource Strain: Not on file  Food Insecurity: Not on file  Transportation Needs: Not on file  Physical Activity: Not on file  Stress: Not on file  Social Connections: Not on  file    Hospital Course:   On admission 07/10/21: Client would not meet with treatment team today.  He is irritable and assessment attempted while he was lying in bed.  "I want to be left alone, I'm irritable."  He answered a few questions.  Denies current suicidal ideations.  Later, reassessed and he stated, "I just need my medications.  I lonely."  He lives alone and upset he was not invited to family gatherings, "It was unacceptable."  He does have an ACT team via [place], this provider spoke with [place] who reports he does not like the new medication and wants his old ones restarted which the client is pleased about.  Discussed discharge plan for Friday.   On admission: "I am not good right now. It's July 4th and I should be with my family." Kenneth Jensen is a 31 y.o. male patient presented to [facility] ED via POV voluntary. Per the ED triage nurses note, the patient to ED via POV with Darius from [place] and on patient's ACT team. Per Darius pt was recently switched to Lybalvi on Wednesday, prior to switching was on Depakote and Seroquel and was on Seroquel injection. Per Darius when patient called pt was upset for several reasons including being out of money and his family not caring for him, Darius reports pt reported that he was going to jump out  in front of cars, upon his arrival to patient pt was still endorsing wanting to hurt himself.    Pt noted with some manic behavior in triage. Pt endorses recent use of marijuana and molly. The patient said, "They were trying to give me the wrong medication." The patient described that his original medication box was a different color than the recent medication (injection) brought to his home by [place] [facility]. The patient is upset during his assessment. He is also experiencing internal stimuli. The patient is upset that he is not spending time with his family.   Medications:  Depakote 500 mg TID, Seroquel 400 mg at bedtime, Trazodone 50 mg at bedtime PRN,  hydroxyzine 25 mg TID PRN  07/11/21: "I finally got some sleep last night."  His depression is moderate with no suicidal ideations.  Denies anxiety.  Auditory hallucinations are low, not command type.  Denies side effects from his medications.  He does not like his ACT team but cannot identify the reasons.  This provider let him know that he could manage this after discharge.  Discharge planned for tomorrow as he is back on his medications and improving.  Staff knows him well and reports he is at his baseline at this time.  07/12/21: Client has met maximum capacity of hospitalization.  She denies suicidal/homicidal ideations, hallucinations, and withdrawal symptoms.  Discharge instructions provided with explanations along with RX, crisis numbers, and follow up appointment.   Musculoskeletal: Strength & Muscle Tone: within normal limits Gait & Station: normal Patient leans: N/A  Psychiatric Specialty Exam: Physical Exam Vitals and nursing note reviewed.  Constitutional:      Appearance: Normal appearance.  HENT:     Head: Normocephalic.     Nose: Nose normal.  Pulmonary:     Effort: Pulmonary effort is normal.  Musculoskeletal:        General: Normal range of motion.     Cervical back: Normal range of motion.  Neurological:     General: No focal deficit present.     Mental Status: He is alert and oriented to person, place, and time.  Psychiatric:        Attention and Perception: Attention and perception normal.        Mood and Affect: Mood is anxious.        Speech: Speech normal.        Behavior: Behavior normal. Behavior is cooperative.        Thought Content: Thought content normal.        Cognition and Memory: Cognition and memory normal.        Judgment: Judgment normal.     Review of Systems  Psychiatric/Behavioral:  The patient is nervous/anxious.   All other systems reviewed and are negative.   Blood pressure 132/86, pulse 90, temperature 98.7 F (37.1 C), temperature  source Oral, resp. rate 18, height _0  (1.651 m), weight 90.7 kg, SpO2 99 %.Body mass index is 33.27 kg/m.  General Appearance: Casual  Eye Contact:  Good  Speech:  Normal Rate  Volume:  Normal  Mood:  Anxious  Affect:  Congruent  Thought Process:  Coherent and Descriptions of Associations: Intact  Orientation:  Full (Time, Place, and Person)  Thought Content:  WDL and Logical  Suicidal Thoughts:  No  Homicidal Thoughts:  No  Memory:  Immediate;   Good  Judgement:  Fair  Insight:  Fair  Psychomotor Activity:  Normal  Concentration:  Concentration: Good and Attention Span: Good  Recall:  Roel Cluck of Knowledge:  Fair  Language:  Good  Akathisia:  No  Handed:  Right  AIMS (if indicated):     Assets:  Housing Leisure Time Physical Health Resilience Social Support  ADL's:  Intact  Cognition:  WNL  Sleep:         Physical Exam: Physical Exam Vitals and nursing note reviewed.  Constitutional:      Appearance: Normal appearance.  HENT:     Head: Normocephalic.     Nose: Nose normal.  Pulmonary:     Effort: Pulmonary effort is normal.  Musculoskeletal:        General: Normal range of motion.     Cervical back: Normal range of motion.  Neurological:     General: No focal deficit present.     Mental Status: He is alert and oriented to person, place, and time.  Psychiatric:        Attention and Perception: Attention and perception normal.        Mood and Affect: Mood is anxious.        Speech: Speech normal.        Behavior: Behavior normal. Behavior is cooperative.        Thought Content: Thought content normal.        Cognition and Memory: Cognition and memory normal.        Judgment: Judgment normal.    Review of Systems  Psychiatric/Behavioral:  The patient is nervous/anxious.   All other systems reviewed and are negative.  Blood pressure 132/86, pulse 90, temperature 98.7 F (37.1 C), temperature source Oral, resp. rate 18, height $RemoveBe'5\' 5"'nTgBxKyPb$  (1.651 m), weight  90.7 kg, SpO2 99 %. Body mass index is 33.27 kg/m.   Social History   Tobacco Use  Smoking Status Every Day   Packs/day: 0.50   Types: Cigarettes  Smokeless Tobacco Never   Tobacco Cessation:  N/A, patient does not currently use tobacco products   Blood Alcohol level:  Lab Results  Component Value Date   ETH <10 07/09/2021   ETH <10 19/62/2297    Metabolic Disorder Labs:  Lab Results  Component Value Date   HGBA1C 5.7 (H) 04/23/2021   MPG 116.89 04/23/2021   MPG 116.89 05/15/2020   No results found for: "PROLACTIN" Lab Results  Component Value Date   CHOL 144 04/23/2021   TRIG 123 04/23/2021   HDL 42 04/23/2021   CHOLHDL 3.4 04/23/2021   VLDL 25 04/23/2021   LDLCALC 77 04/23/2021   LDLCALC 67 05/15/2020    See Psychiatric Specialty Exam and Suicide Risk Assessment completed by Attending Physician prior to discharge.  Discharge destination:  Home  Is patient on multiple antipsychotic therapies at discharge:  No   Has Patient had three or more failed trials of antipsychotic monotherapy by history:  No  Recommended Plan for Multiple Antipsychotic Therapies: NA  Discharge Instructions     Diet - low sodium heart healthy   Complete by: As directed    Discharge instructions   Complete by: As directed    Follow up with [facility] ACT team   Increase activity slowly   Complete by: As directed       Allergies as of 07/12/2021       Reactions   Amoxicillin Swelling   Haldol [haloperidol]    Pt states "I went crazy"   Percocet [oxycodone-acetaminophen]    Vicodin [hydrocodone-acetaminophen] Itching   Vicodin [hydrocodone-acetaminophen]         Medication  List     STOP taking these medications    nicotine 14 mg/24hr patch Commonly known as: NICODERM CQ - dosed in mg/24 hours       TAKE these medications      Indication  divalproex 500 MG 24 hr tablet Commonly known as: DEPAKOTE ER Take 1 tablet (500 mg total) by mouth 3 (three) times daily.   Indication: Manic Phase of Manic-Depression, Schizoaffective   hydrOXYzine 25 MG tablet Commonly known as: ATARAX Take 1 tablet (25 mg total) by mouth 3 (three) times daily as needed for anxiety.  Indication: Feeling Anxious   paliperidone 234 MG/1.5ML injection Commonly known as: INVEGA SUSTENNA Inject 234 mg into the muscle every 28 (twenty-eight) days.  Indication: Schizophrenia   QUEtiapine 400 MG tablet Commonly known as: SEROQUEL Take 1 tablet (400 mg total) by mouth at bedtime.  Indication: Schizophrenia   traZODone 50 MG tablet Commonly known as: DESYREL Take 1 tablet (50 mg total) by mouth at bedtime as needed for sleep.  Indication: [place] Follow up.   Contact information: [place] [address] [phone]                 Follow-up recommendations:  Activity:  as tolerated Diet:  heart healthy diet Bipolar affective disorder, depressed, moderate: Restarted Depakote 500 mg TID Restarted Seroquel 400 mg at bedtime   Anxiety: Restarted hydroxyzine 25 mg TID PRN   Insomnia: Continue Trazodone 50 mg at bedtime PRN  Comments:  follow up with [facility] ACT team  Signed: Waylan Boga, NP 07/12/2021, 9:03 AM

## 2021-07-12 NOTE — Progress Notes (Signed)
Patient irritable at beginning of shift. Paranoid, thinking staff was talking about him. Angry with staff, because he could not reach his aunt on phone. Patient redirectable. Prn given for sleep with good relief.  Encouragement and support provided. Safety checks maintained. Medications given as prescribed. Pt remains safe on unit with q 15 min checks.

## 2021-07-12 NOTE — Plan of Care (Signed)
Problem: Education: Goal: Knowledge of General Education information will improve Description: Including pain rating scale, medication(s)/side effects and non-pharmacologic comfort measures 07/12/2021 1006 by Chalmers Cater, RN Outcome: Progressing 07/12/2021 0951 by Chalmers Cater, RN Outcome: Adequate for Discharge   Problem: Clinical Measurements: Goal: Ability to maintain clinical measurements within normal limits will improve 07/12/2021 1006 by Chalmers Cater, RN Outcome: Progressing 07/12/2021 0951 by Chalmers Cater, RN Outcome: Adequate for Discharge Goal: Will remain free from infection 07/12/2021 1006 by Chalmers Cater, RN Outcome: Progressing 07/12/2021 0951 by Chalmers Cater, RN Outcome: Adequate for Discharge Goal: Diagnostic test results will improve 07/12/2021 1006 by Chalmers Cater, RN Outcome: Progressing 07/12/2021 0951 by Chalmers Cater, RN Outcome: Adequate for Discharge Goal: Respiratory complications will improve 07/12/2021 1006 by Chalmers Cater, RN Outcome: Progressing 07/12/2021 0951 by Chalmers Cater, RN Outcome: Adequate for Discharge Goal: Cardiovascular complication will be avoided 07/12/2021 1006 by Chalmers Cater, RN Outcome: Progressing 07/12/2021 0951 by Chalmers Cater, RN Outcome: Adequate for Discharge   Problem: Health Behavior/Discharge Planning: Goal: Ability to manage health-related needs will improve 07/12/2021 1006 by Chalmers Cater, RN Outcome: Progressing 07/12/2021 0951 by Chalmers Cater, RN Outcome: Adequate for Discharge   Problem: Activity: Goal: Risk for activity intolerance will decrease 07/12/2021 1006 by Chalmers Cater, RN Outcome: Progressing 07/12/2021 0951 by Chalmers Cater, RN Outcome: Adequate for Discharge   Problem: Nutrition: Goal: Adequate nutrition will be maintained 07/12/2021 1006 by Chalmers Cater, RN Outcome: Progressing 07/12/2021 0951 by Chalmers Cater, RN Outcome: Adequate for Discharge   Problem:  Coping: Goal: Level of anxiety will decrease 07/12/2021 1006 by Chalmers Cater, RN Outcome: Progressing 07/12/2021 0951 by Chalmers Cater, RN Outcome: Adequate for Discharge   Problem: Elimination: Goal: Will not experience complications related to bowel motility 07/12/2021 1006 by Chalmers Cater, RN Outcome: Progressing 07/12/2021 0951 by Chalmers Cater, RN Outcome: Adequate for Discharge Goal: Will not experience complications related to urinary retention 07/12/2021 1006 by Chalmers Cater, RN Outcome: Progressing 07/12/2021 0951 by Chalmers Cater, RN Outcome: Adequate for Discharge   Problem: Pain Managment: Goal: General experience of comfort will improve 07/12/2021 1006 by Chalmers Cater, RN Outcome: Progressing 07/12/2021 0951 by Chalmers Cater, RN Outcome: Adequate for Discharge   Problem: Safety: Goal: Ability to remain free from injury will improve 07/12/2021 1006 by Chalmers Cater, RN Outcome: Progressing 07/12/2021 0951 by Chalmers Cater, RN Outcome: Adequate for Discharge   Problem: Skin Integrity: Goal: Risk for impaired skin integrity will decrease 07/12/2021 1006 by Chalmers Cater, RN Outcome: Progressing 07/12/2021 0951 by Chalmers Cater, RN Outcome: Adequate for Discharge   Problem: Education: Goal: Knowledge of [place] [facility] Education information/materials will improve 07/12/2021 1006 by Chalmers Cater, RN Outcome: Progressing 07/12/2021 0951 by Chalmers Cater, RN Outcome: Adequate for Discharge Goal: Emotional status will improve 07/12/2021 1006 by Chalmers Cater, RN Outcome: Progressing 07/12/2021 0951 by Chalmers Cater, RN Outcome: Adequate for Discharge Goal: Mental status will improve 07/12/2021 1006 by Chalmers Cater, RN Outcome: Progressing 07/12/2021 0951 by Chalmers Cater, RN Outcome: Adequate for Discharge Goal: Verbalization of understanding the information provided will improve 07/12/2021 1006 by Chalmers Cater, RN Outcome:  Progressing 07/12/2021 0951 by Chalmers Cater, RN Outcome: Adequate for Discharge   Problem: Safety: Goal: Periods of time without injury will increase 07/12/2021 1006 by Chalmers Cater, RN Outcome:  Progressing 07/12/2021 0951 by Chalmers Cater, RN Outcome: Adequate for Discharge   Problem: Education: Goal: Ability to make informed decisions regarding treatment will improve 07/12/2021 1006 by Chalmers Cater, RN Outcome: Progressing 07/12/2021 0951 by Chalmers Cater, RN Outcome: Adequate for Discharge   Problem: Medication: Goal: Compliance with prescribed medication regimen will improve 07/12/2021 1006 by Chalmers Cater, RN Outcome: Progressing 07/12/2021 0951 by Chalmers Cater, RN Outcome: Adequate for Discharge

## 2021-07-12 NOTE — Progress Notes (Signed)
  [facility] Adult Case Management Discharge Plan :  Will you be returning to the same living situation after discharge:  Yes,  pt plans to return home. At discharge, do you have transportation home?: Yes,  ACT team lead to provide transportation. Do you have the ability to pay for your medications: Yes,  [employer] Medicare.  Release of information consent forms completed and in the chart;  Patient's signature needed at discharge.  Patient to Follow up at:  Follow-up Information     [facility] Follow up.   Why: Your appointment is scheduled for today, 07/12/21 at 3:30PM. Thanks! Contact information: [address] [place] [address] [phone]                 Next level of care provider has access to [facility] Link:no  Safety Planning and Suicide Prevention discussed: Yes,  SPE completed with pt.      Has patient been referred to the Quitline?: Yes, faxed on 07/12/21.  Patient has been referred for addiction treatment: Yes  Glenis Smoker, LCSW 07/12/2021, 11:29 AM

## 2021-07-12 NOTE — [facility] Suicide Risk Assessment (Signed)
[facility] INPATIENT:  Family/Significant Other Suicide Prevention Education  Suicide Prevention Education:  Patient Refusal for Family/Significant Other Suicide Prevention Education: The patient Kenneth Jensen has refused to provide written consent for family/significant other to be provided Family/Significant Other Suicide Prevention Education during admission and/or prior to discharge.  Physician notified.  SPE completed with pt, as pt was unable to recall his mother's number and there was no record of it in the chart. SPI pamphlet provided to pt and pt was encouraged to share information with support network, ask questions, and talk about any concerns relating to SPE. Pt denies access to guns/firearms and verbalized understanding of information provided. Mobile Crisis information also provided to pt.  Glenis Smoker 07/12/2021, 3:51 PM

## 2021-07-12 NOTE — [facility] Suicide Risk Assessment (Signed)
Suicide Risk Assessment  [facility] Discharge Suicide Risk Assessment   Principal Problem: Schizoaffective disorder, bipolar type [facility]) Discharge Diagnoses: Principal Problem:   Schizoaffective disorder, bipolar type (HCC)   Total Time spent with patient: 45 minutes  Musculoskeletal: Strength & Muscle Tone: within normal limits Gait & Station: normal Patient leans: N/A  Psychiatric Specialty Exam: Physical Exam Vitals and nursing note reviewed.  Constitutional:      Appearance: Normal appearance.  HENT:     Head: Normocephalic.     Nose: Nose normal.  Pulmonary:     Effort: Pulmonary effort is normal.  Musculoskeletal:        General: Normal range of motion.     Cervical back: Normal range of motion.  Neurological:     General: No focal deficit present.     Mental Status: He is alert and oriented to person, place, and time.  Psychiatric:        Attention and Perception: Attention and perception normal.        Mood and Affect: Mood is anxious.        Speech: Speech normal.        Behavior: Behavior normal. Behavior is cooperative.        Thought Content: Thought content normal.        Cognition and Memory: Cognition and memory normal.        Judgment: Judgment normal.     Review of Systems  Psychiatric/Behavioral:  The patient is nervous/anxious.   All other systems reviewed and are negative.   Blood pressure 132/86, pulse 90, temperature 98.7 F (37.1 C), temperature source Oral, resp. rate 18, height 5\' 5"  (1.651 m), weight 90.7 kg, SpO2 99 %.Body mass index is 33.27 kg/m.  General Appearance: Casual  Eye Contact:  Good  Speech:  Normal Rate  Volume:  Normal  Mood:  Anxious  Affect:  Congruent  Thought Process:  Coherent and Descriptions of Associations: Intact  Orientation:  Full (Time, Place, and Person)  Thought Content:  WDL and Logical  Suicidal Thoughts:  No  Homicidal Thoughts:  No  Memory:  Immediate;   Good Recent;   Good Remote;   Good  Judgement:   Fair  Insight:  Fair  Psychomotor Activity:  Normal  Concentration:  Concentration: Good and Attention Span: Good  Recall:  Good  Fund of Knowledge:  Fair  Language:  Good  Akathisia:  No  Handed:  Right  AIMS (if indicated):     Assets:  Housing Leisure Time Physical Health Resilience Social Support  ADL's:  Intact  Cognition:  WNL  Sleep:        Physical Exam: Physical Exam Vitals and nursing note reviewed.  Constitutional:      Appearance: Normal appearance.  HENT:     Head: Normocephalic.     Nose: Nose normal.  Pulmonary:     Effort: Pulmonary effort is normal.  Musculoskeletal:        General: Normal range of motion.     Cervical back: Normal range of motion.  Neurological:     General: No focal deficit present.     Mental Status: He is alert and oriented to person, place, and time.  Psychiatric:        Attention and Perception: Attention and perception normal.        Mood and Affect: Mood is anxious.        Speech: Speech normal.        Behavior: Behavior normal. Behavior is  cooperative.        Thought Content: Thought content normal.        Cognition and Memory: Cognition and memory normal.        Judgment: Judgment normal.    Review of Systems  Psychiatric/Behavioral:  The patient is nervous/anxious.   All other systems reviewed and are negative.  Blood pressure 132/86, pulse 90, temperature 98.7 F (37.1 C), temperature source Oral, resp. rate 18, height 5\' 5"  (1.651 m), weight 90.7 kg, SpO2 99 %. Body mass index is 33.27 kg/m.  Mental Status Per Nursing Assessment::   On Admission:  NA  Demographic Factors:  Male and Living alone  Loss Factors: NA  Historical Factors: NA  Risk Reduction Factors:   Positive social support and Positive therapeutic relationship  Continued Clinical Symptoms:  Anxiety, mild  Cognitive Features That Contribute To Risk:  None    Suicide Risk:  Minimal: No identifiable suicidal ideation.  Patients  presenting with no risk factors but with morbid ruminations; may be classified as minimal risk based on the severity of the depressive symptoms   Follow-up Information     [facility] Follow up.   Contact information: [address] [place] [phone]                 Plan Of Care/Follow-up recommendations:  Activity:  as tolerated Diet:  heart healthy diet Bipolar affective disorder, depressed, moderate: Restarted Depakote 500 mg TID Restarted Seroquel 400 mg at bedtime   Anxiety: Restarted hydroxyzine 25 mg TID PRN   Insomnia: Continue Trazodone 50 mg at bedtime PRN  [phone], NP 07/12/2021, 7:36 AM

## 2021-07-12 NOTE — Plan of Care (Signed)
Problem: Education: Goal: Knowledge of General Education information will improve Description: Including pain rating scale, medication(s)/side effects and non-pharmacologic comfort measures 07/12/2021 1006 by Chalmers Cater, RN Outcome: Progressing 07/12/2021 1006 by Chalmers Cater, RN Outcome: Progressing 07/12/2021 0951 by Chalmers Cater, RN Outcome: Adequate for Discharge   Problem: Health Behavior/Discharge Planning: Goal: Ability to manage health-related needs will improve 07/12/2021 1006 by Chalmers Cater, RN Outcome: Progressing 07/12/2021 1006 by Chalmers Cater, RN Outcome: Progressing 07/12/2021 0951 by Chalmers Cater, RN Outcome: Adequate for Discharge   Problem: Clinical Measurements: Goal: Ability to maintain clinical measurements within normal limits will improve 07/12/2021 1006 by Chalmers Cater, RN Outcome: Progressing 07/12/2021 1006 by Chalmers Cater, RN Outcome: Progressing 07/12/2021 0951 by Chalmers Cater, RN Outcome: Adequate for Discharge Goal: Will remain free from infection 07/12/2021 1006 by Chalmers Cater, RN Outcome: Progressing 07/12/2021 1006 by Chalmers Cater, RN Outcome: Progressing 07/12/2021 0951 by Chalmers Cater, RN Outcome: Adequate for Discharge Goal: Diagnostic test results will improve 07/12/2021 1006 by Chalmers Cater, RN Outcome: Progressing 07/12/2021 1006 by Chalmers Cater, RN Outcome: Progressing 07/12/2021 0951 by Chalmers Cater, RN Outcome: Adequate for Discharge Goal: Respiratory complications will improve 07/12/2021 1006 by Chalmers Cater, RN Outcome: Progressing 07/12/2021 1006 by Chalmers Cater, RN Outcome: Progressing 07/12/2021 0951 by Chalmers Cater, RN Outcome: Adequate for Discharge Goal: Cardiovascular complication will be avoided 07/12/2021 1006 by Chalmers Cater, RN Outcome: Progressing 07/12/2021 1006 by Chalmers Cater, RN Outcome: Progressing 07/12/2021 0951 by Chalmers Cater, RN Outcome: Adequate for Discharge    Problem: Activity: Goal: Risk for activity intolerance will decrease 07/12/2021 1006 by Chalmers Cater, RN Outcome: Progressing 07/12/2021 1006 by Chalmers Cater, RN Outcome: Progressing 07/12/2021 0951 by Chalmers Cater, RN Outcome: Adequate for Discharge   Problem: Nutrition: Goal: Adequate nutrition will be maintained 07/12/2021 1006 by Chalmers Cater, RN Outcome: Progressing 07/12/2021 1006 by Chalmers Cater, RN Outcome: Progressing 07/12/2021 0951 by Chalmers Cater, RN Outcome: Adequate for Discharge   Problem: Coping: Goal: Level of anxiety will decrease 07/12/2021 1006 by Chalmers Cater, RN Outcome: Progressing 07/12/2021 1006 by Chalmers Cater, RN Outcome: Progressing 07/12/2021 0951 by Chalmers Cater, RN Outcome: Adequate for Discharge   Problem: Elimination: Goal: Will not experience complications related to bowel motility 07/12/2021 1006 by Chalmers Cater, RN Outcome: Progressing 07/12/2021 1006 by Chalmers Cater, RN Outcome: Progressing 07/12/2021 0951 by Chalmers Cater, RN Outcome: Adequate for Discharge Goal: Will not experience complications related to urinary retention 07/12/2021 1006 by Chalmers Cater, RN Outcome: Progressing 07/12/2021 1006 by Chalmers Cater, RN Outcome: Progressing 07/12/2021 0951 by Chalmers Cater, RN Outcome: Adequate for Discharge   Problem: Pain Managment: Goal: General experience of comfort will improve 07/12/2021 1006 by Chalmers Cater, RN Outcome: Progressing 07/12/2021 1006 by Chalmers Cater, RN Outcome: Progressing 07/12/2021 0951 by Chalmers Cater, RN Outcome: Adequate for Discharge   Problem: Safety: Goal: Ability to remain free from injury will improve 07/12/2021 1006 by Chalmers Cater, RN Outcome: Progressing 07/12/2021 1006 by Chalmers Cater, RN Outcome: Progressing 07/12/2021 0951 by Chalmers Cater, RN Outcome: Adequate for Discharge   Problem: Skin Integrity: Goal: Risk for impaired skin integrity will decrease 07/12/2021  1006 by Chalmers Cater, RN Outcome: Progressing 07/12/2021 1006 by Chalmers Cater, RN Outcome: Progressing 07/12/2021 0951 by Chalmers Cater, RN Outcome: Adequate  for Discharge   Problem: Education: Goal: Knowledge of [place] [facility] Education information/materials will improve 07/12/2021 1006 by Chalmers Cater, RN Outcome: Progressing 07/12/2021 1006 by Chalmers Cater, RN Outcome: Progressing 07/12/2021 0951 by Chalmers Cater, RN Outcome: Adequate for Discharge Goal: Emotional status will improve 07/12/2021 1006 by Chalmers Cater, RN Outcome: Progressing 07/12/2021 1006 by Chalmers Cater, RN Outcome: Progressing 07/12/2021 0951 by Chalmers Cater, RN Outcome: Adequate for Discharge Goal: Mental status will improve 07/12/2021 1006 by Chalmers Cater, RN Outcome: Progressing 07/12/2021 1006 by Chalmers Cater, RN Outcome: Progressing 07/12/2021 0951 by Chalmers Cater, RN Outcome: Adequate for Discharge Goal: Verbalization of understanding the information provided will improve 07/12/2021 1006 by Chalmers Cater, RN Outcome: Progressing 07/12/2021 1006 by Chalmers Cater, RN Outcome: Progressing 07/12/2021 0951 by Chalmers Cater, RN Outcome: Adequate for Discharge   Problem: Safety: Goal: Periods of time without injury will increase 07/12/2021 1006 by Chalmers Cater, RN Outcome: Progressing 07/12/2021 1006 by Chalmers Cater, RN Outcome: Progressing 07/12/2021 0951 by Chalmers Cater, RN Outcome: Adequate for Discharge   Problem: Education: Goal: Ability to make informed decisions regarding treatment will improve 07/12/2021 1006 by Chalmers Cater, RN Outcome: Progressing 07/12/2021 1006 by Chalmers Cater, RN Outcome: Progressing 07/12/2021 0951 by Chalmers Cater, RN Outcome: Adequate for Discharge   Problem: Medication: Goal: Compliance with prescribed medication regimen will improve 07/12/2021 1006 by Chalmers Cater, RN Outcome: Progressing 07/12/2021 1006 by Chalmers Cater,  RN Outcome: Progressing 07/12/2021 0951 by Chalmers Cater, RN Outcome: Adequate for Discharge

## 2021-07-12 NOTE — Plan of Care (Signed)
Pt rates depression 8/10 and anxiety 9/10, Pt denies VH but endorses AH. Pt denies SI, HI and AVH. Pt was educated on care plan and verbalizes. Pt refuses meds. however encouraged to do so. Torrie Mayers RN Problem: Education: Goal: Knowledge of General Education information will improve Description: Including pain rating scale, medication(s)/side effects and non-pharmacologic comfort measures Outcome: Adequate for Discharge   Problem: Health Behavior/Discharge Planning: Goal: Ability to manage health-related needs will improve Outcome: Adequate for Discharge   Problem: Clinical Measurements: Goal: Ability to maintain clinical measurements within normal limits will improve Outcome: Adequate for Discharge Goal: Will remain free from infection Outcome: Adequate for Discharge Goal: Diagnostic test results will improve Outcome: Adequate for Discharge Goal: Respiratory complications will improve Outcome: Adequate for Discharge Goal: Cardiovascular complication will be avoided Outcome: Adequate for Discharge   Problem: Activity: Goal: Risk for activity intolerance will decrease Outcome: Adequate for Discharge   Problem: Nutrition: Goal: Adequate nutrition will be maintained Outcome: Adequate for Discharge   Problem: Coping: Goal: Level of anxiety will decrease Outcome: Adequate for Discharge   Problem: Elimination: Goal: Will not experience complications related to bowel motility Outcome: Adequate for Discharge Goal: Will not experience complications related to urinary retention Outcome: Adequate for Discharge   Problem: Pain Managment: Goal: General experience of comfort will improve Outcome: Adequate for Discharge   Problem: Safety: Goal: Ability to remain free from injury will improve Outcome: Adequate for Discharge   Problem: Skin Integrity: Goal: Risk for impaired skin integrity will decrease Outcome: Adequate for Discharge   Problem: Education: Goal: Knowledge of   General Education information/materials will improve Outcome: Adequate for Discharge Goal: Emotional status will improve Outcome: Adequate for Discharge Goal: Mental status will improve Outcome: Adequate for Discharge Goal: Verbalization of understanding the information provided will improve Outcome: Adequate for Discharge   Problem: Safety: Goal: Periods of time without injury will increase Outcome: Adequate for Discharge   Problem: Education: Goal: Ability to make informed decisions regarding treatment will improve Outcome: Adequate for Discharge   Problem: Medication: Goal: Compliance with prescribed medication regimen will improve Outcome: Adequate for Discharge

## 2021-07-12 NOTE — Plan of Care (Signed)
  Problem: Safety: Goal: Ability to remain free from injury will improve Outcome: Progressing   

## 2021-07-12 NOTE — Progress Notes (Signed)
Pt denies SI, HI and AVH. Pt was educated on dc plan and verbalizes understanding. Pt received dc packet, AVS and belongings. Torrie Mayers RN

## 2021-08-26 ENCOUNTER — Emergency Department
Admission: EM | Admit: 2021-08-26 | Discharge: 2021-08-27 | Disposition: A | Discharge status: Home / Self Care | Payer: Medicare HMO | Attending: Emergency Medicine | Admitting: Emergency Medicine

## 2021-08-26 ENCOUNTER — Other Ambulatory Visit: Payer: Self-pay

## 2021-08-26 DIAGNOSIS — F99 Mental disorder, not otherwise specified: Secondary | ICD-10-CM | POA: Diagnosis not present

## 2021-08-26 DIAGNOSIS — Z20822 Contact with and (suspected) exposure to covid-19: Secondary | ICD-10-CM | POA: Diagnosis not present

## 2021-08-26 DIAGNOSIS — F209 Schizophrenia, unspecified: Secondary | ICD-10-CM | POA: Diagnosis not present

## 2021-08-26 DIAGNOSIS — F250 Schizoaffective disorder, bipolar type: Secondary | ICD-10-CM | POA: Diagnosis not present

## 2021-08-26 DIAGNOSIS — Z79899 Other long term (current) drug therapy: Secondary | ICD-10-CM | POA: Diagnosis not present

## 2021-08-26 DIAGNOSIS — F17200 Nicotine dependence, unspecified, uncomplicated: Secondary | ICD-10-CM | POA: Diagnosis not present

## 2021-08-26 DIAGNOSIS — R7303 Prediabetes: Secondary | ICD-10-CM | POA: Insufficient documentation

## 2021-08-26 DIAGNOSIS — F1220 Cannabis dependence, uncomplicated: Secondary | ICD-10-CM | POA: Insufficient documentation

## 2021-08-26 LAB — COMPREHENSIVE METABOLIC PANEL
ALT: 14 U/L (ref 0–44)
AST: 24 U/L (ref 15–41)
Albumin: 4.3 g/dL (ref 3.5–5.0)
Alkaline Phosphatase: 56 U/L (ref 38–126)
Anion gap: 9 (ref 5–15)
BUN: 11 mg/dL (ref 6–20)
CO2: 28 mmol/L (ref 22–32)
Calcium: 9.1 mg/dL (ref 8.9–10.3)
Chloride: 100 mmol/L (ref 98–111)
Creatinine, Ser: 0.86 mg/dL (ref 0.61–1.24)
GFR, Estimated: >60 mL/min (ref >60)
Glucose, Bld: 93 mg/dL (ref 70–99)
Potassium: 3.8 mmol/L (ref 3.5–5.1)
Sodium: 137 mmol/L (ref 135–145)
Total Bilirubin: 0.6 mg/dL (ref 0.3–1.2)
Total Protein: 7.8 g/dL (ref 6.5–8.1)

## 2021-08-26 LAB — CBC
HCT: 44.1 % (ref 39.0–52.0)
Hemoglobin: 15 g/dL (ref 13.0–17.0)
MCH: 29.6 pg (ref 26.0–34.0)
MCHC: 34 g/dL (ref 30.0–36.0)
MCV: 87.2 fL (ref 80.0–100.0)
Platelets: 301 10*3/uL (ref 150–400)
RBC: 5.06 MIL/uL (ref 4.22–5.81)
RDW: 13.2 % (ref 11.5–15.5)
WBC: 11.2 10*3/uL — ABNORMAL HIGH (ref 4.0–10.5)
nRBC: 0 % (ref 0.0–0.2)

## 2021-08-26 LAB — URINE DRUG SCREEN, QUALITATIVE (ARMC ONLY)
Amphetamines, Ur Screen: NOT DETECTED
Barbiturates, Ur Screen: NOT DETECTED
Benzodiazepine, Ur Scrn: NOT DETECTED
Cannabinoid 50 Ng, Ur ~~LOC~~: POSITIVE — AB
Cocaine Metabolite,Ur ~~LOC~~: NOT DETECTED
MDMA (Ecstasy)Ur Screen: NOT DETECTED
Methadone Scn, Ur: NOT DETECTED
Opiate, Ur Screen: NOT DETECTED
Phencyclidine (PCP) Ur S: NOT DETECTED
Tricyclic, Ur Screen: POSITIVE — AB

## 2021-08-26 LAB — SALICYLATE LEVEL: Salicylate Lvl: <7 mg/dL — ABNORMAL LOW (ref 7.0–30.0)

## 2021-08-26 LAB — ETHANOL: Alcohol, Ethyl (B): <10 mg/dL (ref <10)

## 2021-08-26 LAB — SARS CORONAVIRUS 2 BY RT PCR: SARS Coronavirus 2 by RT PCR: NEGATIVE

## 2021-08-26 LAB — ACETAMINOPHEN LEVEL: Acetaminophen (Tylenol), Serum: <10 ug/mL — ABNORMAL LOW (ref 10–30)

## 2021-08-26 NOTE — ED Notes (Addendum)
Patient placed belongings in personal bag: pair of [employer] shoes, black jogging pants black and tan shirt, underwear and socks also key chain one key, wallet and phone
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
## 2021-08-26 NOTE — ED Notes (Signed)
Pt moved to [facility]-4

## 2021-08-26 NOTE — ED Notes (Signed)
Report received from Sue Lush, English as a second language teacher. Patient alert and oriented, warm and dry, and acting out with attention seeking behaviors. Patient denies SI, HI, AVH and pain. Patient made aware of Q15 minute rounds and Psychologist, counselling presence for their safety. Patient instructed to come to this nurse with needs or concerns.

## 2021-08-26 NOTE — ED Triage Notes (Addendum)
Pt to ED voluntarily for mental health evaluation. Pt yelling and throwing things. Pt taken to triage room 2 with secretary and EDT. Pt dressed out by EDT but pt refusing to talk to this RN. Pt requesting to only talk to Diplomatic Services operational officer. Pt did shake head yes when asked if he was having SI. Pt appears very paranoid.   Secretary in room with this RN and EDT. Pt taken to room 22 after triage complete.

## 2021-08-27 DIAGNOSIS — F250 Schizoaffective disorder, bipolar type: Secondary | ICD-10-CM

## 2021-08-27 LAB — VALPROIC ACID LEVEL: Valproic Acid Lvl: 46 ug/mL — ABNORMAL LOW (ref 50.0–100.0)

## 2021-08-27 MED ORDER — HYDROXYZINE HCL 25 MG PO TABS
25.0000 mg | ORAL_TABLET | Freq: Three times a day (TID) | ORAL | Status: DC | PRN
  Administered 2021-08-27: 25 mg via ORAL
  Filled 2021-08-27: qty 1

## 2021-08-27 MED ORDER — DIVALPROEX SODIUM ER 500 MG PO TB24
500.0000 mg | ORAL_TABLET | Freq: Three times a day (TID) | ORAL | Status: DC
  Administered 2021-08-27: 500 mg via ORAL
  Filled 2021-08-27: qty 1

## 2021-08-27 NOTE — ED Provider Notes (Signed)
[facility] Provider Note    Event Date/Time   First MD Initiated Contact with Patient 08/26/21 1920     (approximate)   History   Mental Health Problem   HPI  Kenneth Jensen is a 31 y.o. male who was throwing things in triage.  He reports that his schizophrenia is acting up.  He seems very calm back here in the emergency department and then in triage.  Psych comes to see him in thinks that they will keep him overnight and reevaluate him tomorrow.  He seems to have calmed down a good bit.      Physical Exam   Triage Vital Signs: ED Triage Vitals  Enc Vitals Group     BP 08/26/21 1818 106/61     Pulse Rate 08/26/21 1818 (!) 107     Resp 08/26/21 1818 18     Temp 08/26/21 1818 98.2 F (36.8 C)     Temp Source 08/26/21 1818 Oral     SpO2 08/26/21 1818 95 %     Weight --      Height --      Head Circumference --      Peak Flow --      Pain Score 08/26/21 1829 0     Pain Loc --      Pain Edu? --      Excl. in GC? --     Most recent vital signs: Vitals:   08/26/21 1818 08/26/21 2029  BP: 106/61 117/80  Pulse: (!) 107 83  Resp: 18 16  Temp: 98.2 F (36.8 C) 98.2 F (36.8 C)  SpO2: 95% 97%     General: Awake, no distress.  CV:  Good peripheral perfusion.  Heart regular rate and rhythm no audible murmurs Resp:  Normal effort.  Lungs are clear Abd:  No distention.  Extremities with no edema   ED Results / Procedures / Treatments  Other:    Labs (all labs ordered are listed, but only abnormal results are displayed) Labs Reviewed  SALICYLATE LEVEL - Abnormal; Notable for the following components:      Result Value   Salicylate Lvl <7.0 (*)    All other components within normal limits  ACETAMINOPHEN LEVEL - Abnormal; Notable for the following components:   Acetaminophen (Tylenol), Serum <10 (*)    All other components within normal limits  CBC - Abnormal; Notable for the following components:   WBC 11.2 (*)    All other  components within normal limits  URINE DRUG SCREEN, QUALITATIVE ([facility] ONLY) - Abnormal; Notable for the following components:   Tricyclic, Ur Screen POSITIVE (*)    Cannabinoid 50 Ng, Ur [place] POSITIVE (*)    All other components within normal limits  SARS CORONAVIRUS 2 BY RT PCR  COMPREHENSIVE METABOLIC PANEL  ETHANOL     EKG   RADIOLOGY   PROCEDURES:  Critical Care performed:  Procedures   MEDICATIONS ORDERED IN ED: Medications - No data to display   IMPRESSION / MDM / ASSESSMENT AND PLAN / ED COURSE  I reviewed the triage vital signs and the nursing notes.   Differential diagnosis includes, but is not limited to, schizophrenia.  Patient is taking his medicines he says.  Perhaps he needs some time to just calm down.  Psych will reevaluate him again today.  I saw him last night initially and he was moved to [facility] before this note could be completed.  Patient's presentation is most consistent with  acute complicated illness / injury requiring diagnostic workup.     FINAL CLINICAL IMPRESSION(S) / ED DIAGNOSES   Final diagnoses:  Schizophrenia, unspecified type (HCC)     Rx / DC Orders   ED Discharge Orders     None        Note:  This document was prepared using Dragon voice recognition software and may include unintentional dictation errors.   Arnaldo Natal, MD 08/27/21 [phone]

## 2021-08-27 NOTE — Discharge Instructions (Signed)
Please continue your medications.  Please follow-up with your providers.  Do not hesitate to return if you have any further problems.

## 2021-08-27 NOTE — ED Notes (Addendum)
At time of discharge pt asking for his book. There was no note in triage referring to any book and a book was not listed with pt personal belongings that came into ED with pt. This nurse did not see a book in the BHU belongings bin or inside his bag of personal items. Pt informed this nurse he was triaged in the second triage room but this nurse was unable to locate book on triage desk or on first nurse desk. Did check to make sure book was not located at the quad nurse desk and had not been turned into the charge nurse. Book not able to be located at this time. Informed pt that we would continue to look for the book (called "The Bully") and if found will call him. Pt agitated and demanding money for the book. Charge nurse informed and asked to speak with pt regarding his needs at this time.

## 2021-08-27 NOTE — Consult Note (Signed)
[facility] Psych ED Progress Note  08/27/2021 4:14 PM Kenneth Jensen  MRN:  [id number]   Method of visit?: Face to Face  Subjective:  "I am feeling fine now. I just needed a little time away." Patient is coherent, speaking in linear sentences. Denies suicidal or homicidal ideations, auditory or visual hallucinations. He does not appear to be responding to internal stimuli. He is requesting discharge.   Principal Problem: Schizoaffective disorder, bipolar type (HCC) Diagnosis:  Principal Problem:   Schizoaffective disorder, bipolar type (HCC) Active Problems:   Cannabis use disorder, moderate, dependence (HCC)   Tobacco use disorder   Prediabetes  Total Time spent with patient:  10 min  Past Psychiatric History: schizoaffective disorder  Past Medical History:  Past Medical History:  Diagnosis Date   Asthma    Bipolar 1 disorder (HCC)    Depression    Schizophrenia (HCC)    Schizotaxia    No past surgical history on file. Family History: No family history on file. Family Psychiatric  History:  Social History:  Social History   Substance and Sexual Activity  Alcohol Use Yes   Comment: social     Social History   Substance and Sexual Activity  Drug Use Yes   Types: Marijuana    Social History   Socioeconomic History   Marital status: Single    Spouse name: Not on file   Number of children: Not on file   Years of education: Not on file   Highest education level: Not on file  Occupational History   Not on file  Tobacco Use   Smoking status: Every Day    Packs/day: 0.50    Types: Cigarettes   Smokeless tobacco: Never  Substance and Sexual Activity   Alcohol use: Yes    Comment: social   Drug use: Yes    Types: Marijuana   Sexual activity: Not Currently  Other Topics Concern   Not on file  Social History Narrative   ** Merged History Encounter **       Social Determinants of Health   Financial Resource Strain: Not on file  Food Insecurity: Not on file   Transportation Needs: Not on file  Physical Activity: Not on file  Stress: Not on file  Social Connections: Not on file    Sleep: Good  Appetite:  Good  Current Medications: Current Facility-Administered Medications  Medication Dose Route Frequency Provider Last Rate Last Admin   divalproex (DEPAKOTE ER) 24 hr tablet 500 mg  500 mg Oral TID Gabriel Cirri F, NP   500 mg at 08/27/21 1122   hydrOXYzine (ATARAX) tablet 25 mg  25 mg Oral TID PRN Vanetta Mulders, NP   25 mg at 08/27/21 1122   Current Outpatient Medications  Medication Sig Dispense Refill   divalproex (DEPAKOTE ER) 500 MG 24 hr tablet Take 1 tablet (500 mg total) by mouth 3 (three) times daily. 90 tablet 1   hydrOXYzine (ATARAX) 25 MG tablet Take 1 tablet (25 mg total) by mouth 3 (three) times daily as needed for anxiety. 30 tablet 1   paliperidone (INVEGA SUSTENNA) 234 MG/1.5ML injection Inject 234 mg into the muscle every 28 (twenty-eight) days. 1.8 mL 1   QUEtiapine (SEROQUEL XR) 400 MG 24 hr tablet Take 1 tablet by mouth daily.     QUEtiapine (SEROQUEL) 400 MG tablet Take 1 tablet (400 mg total) by mouth at bedtime. 30 tablet 1   traZODone (DESYREL) 50 MG tablet Take 1 tablet (50 mg  total) by mouth at bedtime as needed for sleep. 30 tablet 0    Lab Results:  Results for orders placed or performed during the hospital encounter of 08/26/21 (from the past 48 hour(s))  Comprehensive metabolic panel     Status: None   Collection Time: 08/26/21  6:28 PM  Result Value Ref Range   Sodium 137 135 - 145 mmol/L   Potassium 3.8 3.5 - 5.1 mmol/L   Chloride 100 98 - 111 mmol/L   CO2 28 22 - 32 mmol/L   Glucose, Bld 93 70 - 99 mg/dL    Comment: Glucose reference range applies only to samples taken after fasting for at least 8 hours.   BUN 11 6 - 20 mg/dL   Creatinine, Ser 6.55 0.61 - 1.24 mg/dL   Calcium 9.1 8.9 - 37.4 mg/dL   Total Protein 7.8 6.5 - 8.1 g/dL   Albumin 4.3 3.5 - 5.0 g/dL   AST 24 15 - 41 U/L   ALT 14  0 - 44 U/L   Alkaline Phosphatase 56 38 - 126 U/L   Total Bilirubin 0.6 0.3 - 1.2 mg/dL   GFR, Estimated >82 >70 mL/min    Comment: (NOTE) Calculated using the CKD-EPI Creatinine Equation (2021)    Anion gap 9 5 - 15    Comment: Performed at [facility], [address], [place], [place] [address]  Ethanol     Status: None   Collection Time: 08/26/21  6:28 PM  Result Value Ref Range   Alcohol, Ethyl (B) <10 <10 mg/dL    Comment: (NOTE) Lowest detectable limit for serum alcohol is 10 mg/dL.  For medical purposes only. Performed at [facility], [address] [place], [place] [address]   Salicylate level     Status: Abnormal   Collection Time: 08/26/21  6:28 PM  Result Value Ref Range   Salicylate Lvl <7.0 (L) 7.0 - 30.0 mg/dL    Comment: Performed at [facility], [address] [place], [place] [address]  Acetaminophen level     Status: Abnormal   Collection Time: 08/26/21  6:28 PM  Result Value Ref Range   Acetaminophen (Tylenol), Serum <10 (L) 10 - 30 ug/mL    Comment: (NOTE) Therapeutic concentrations vary significantly. A range of 10-30 ug/mL  may be an effective concentration for many patients. However, some  are best treated at concentrations outside of this range. Acetaminophen concentrations >150 ug/mL at 4 hours after ingestion  and >50 ug/mL at 12 hours after ingestion are often associated with  toxic reactions.  Performed at [facility], [address] [place], [place] [address]   cbc     Status: Abnormal   Collection Time: 08/26/21  6:28 PM  Result Value Ref Range   WBC 11.2 (H) 4.0 - 10.5 K/uL   RBC 5.06 4.22 - 5.81 MIL/uL   Hemoglobin 15.0 13.0 - 17.0 g/dL   HCT 88.3 25.4 - 98.2 %   MCV 87.2 80.0 - 100.0 fL   MCH 29.6 26.0 - 34.0 pg   MCHC 34.0 30.0 - 36.0 g/dL   RDW 64.1 58.3 - 09.4 %   Platelets 301 150 - 400 K/uL   nRBC 0.0 0.0 - 0.2 %    Comment: Performed at [facility], [address], [place], [place] [address]  Valproic acid level     Status: Abnormal   Collection Time: 08/26/21  6:28 PM  Result Value Ref Range   Valproic  Acid Lvl 46 (L) 50.0 - 100.0 ug/mL    Comment: Performed at [facility], [address] [place], [place] [address]  Urine Drug Screen, Qualitative     Status: Abnormal   Collection Time: 08/26/21  6:29 PM  Result Value Ref Range   Tricyclic, Ur Screen POSITIVE (A) NONE DETECTED   Amphetamines, Ur Screen NONE DETECTED NONE DETECTED   MDMA (Ecstasy)Ur Screen NONE DETECTED NONE DETECTED   Cocaine Metabolite,Ur [place] NONE DETECTED NONE DETECTED   Opiate, Ur Screen NONE DETECTED NONE DETECTED   Phencyclidine (PCP) Ur S NONE DETECTED NONE DETECTED   Cannabinoid 50 Ng, Ur [place] POSITIVE (A) NONE DETECTED   Barbiturates, Ur Screen NONE DETECTED NONE DETECTED   Benzodiazepine, Ur Scrn NONE DETECTED NONE DETECTED   Methadone Scn, Ur NONE DETECTED NONE DETECTED    Comment: (NOTE) Tricyclics + metabolites, urine    Cutoff 1000 ng/mL Amphetamines + metabolites, urine  Cutoff 1000 ng/mL MDMA (Ecstasy), urine              Cutoff 500 ng/mL Cocaine Metabolite, urine          Cutoff 300 ng/mL Opiate + metabolites, urine        Cutoff 300 ng/mL Phencyclidine (PCP), urine         Cutoff 25 ng/mL Cannabinoid, urine                 Cutoff 50 ng/mL Barbiturates + metabolites, urine  Cutoff 200 ng/mL Benzodiazepine, urine              Cutoff 200 ng/mL Methadone, urine                   Cutoff 300 ng/mL  The urine drug screen provides only a preliminary, unconfirmed analytical test result and should not be used for non-medical purposes. Clinical consideration and professional judgment should be applied to any positive drug screen result due to possible interfering substances. A more specific alternate chemical method must be used in order to obtain a confirmed analytical result. Gas chromatography / mass spectrometry (GC/MS) is the preferred confirm atory  method. Performed at [facility], [address] [place], [place] [address]   SARS Coronavirus 2 by RT PCR (hospital order, performed in [facility] hospital lab) *cepheid single result test* Anterior Nasal Swab     Status: None   Collection Time: 08/26/21  9:45 PM   Specimen: Anterior Nasal Swab  Result Value Ref Range   SARS Coronavirus 2 by RT PCR NEGATIVE NEGATIVE    Comment: (NOTE) SARS-CoV-2 target nucleic acids are NOT DETECTED.  The SARS-CoV-2 RNA is generally detectable in upper and lower respiratory specimens during the acute phase of infection. The lowest concentration of SARS-CoV-2 viral copies this assay can detect is 250 copies / mL. A negative result does not preclude SARS-CoV-2 infection and should not be used as the sole basis for treatment or other patient management decisions.  A negative result may occur with improper specimen collection / handling, submission of specimen other than nasopharyngeal swab, presence of viral mutation(s) within the areas targeted by this assay, and inadequate number of viral copies (<250 copies / mL). A negative result must be combined with clinical observations, patient history, and epidemiological information.  Fact Sheet for Patients:   RoadLapTop.co.za  Fact Sheet for Healthcare Providers: http://kim-miller.com/  This test is not yet approved or  cleared by the [place] FDA and has been authorized for detection and/or diagnosis of  SARS-CoV-2 by FDA under an Emergency Use Authorization (EUA).  This EUA will remain in effect (meaning this test can be used) for the duration of the COVID-19 declaration under Section 564(b)(1) of the Act, 21 U.S.C. section 360bbb-3(b)(1), unless the authorization is terminated or revoked sooner.  Performed at [facility], [address] [place], [place] [address]     Blood Alcohol level:  Lab Results  Component Value  Date   [facility] <10 08/26/2021   ETH <10 07/09/2021    Physical Findings: AIMS:  , ,  ,  ,    CIWA:    COWS:     Musculoskeletal: Strength & Muscle Tone: within normal limits Gait & Station: normal Patient leans: N/A  Psychiatric Specialty Exam:  Presentation  General Appearance: Appropriate for Environment  Eye Contact:Good  Speech:Clear and Coherent  Speech Volume:Normal  Handedness:Right   Mood and Affect  Mood:Euthymic  Affect:Appropriate   Thought Process  Thought Processes:Coherent; Linear  Descriptions of Associations:Intact  Orientation:Full (Time, Place and Person)  Thought Content:Logical  History of Schizophrenia/Schizoaffective disorder:Yes  Duration of Psychotic Symptoms:Greater than six months  Hallucinations:Hallucinations: None  Ideas of Reference:None  Suicidal Thoughts:Suicidal Thoughts: No  Homicidal Thoughts:Homicidal Thoughts: No   Sensorium  Memory:Immediate Good  Judgment:Fair  Insight:Fair   Executive Functions  Concentration:Fair  Attention Span:Fair  Recall:Fair  Fund of Knowledge:Fair  Language:Fair   Psychomotor Activity  Psychomotor Activity:Psychomotor Activity: Normal   Assets  Assets:Communication Skills; Desire for Improvement; Resilience; Social Support   Sleep  Sleep:Sleep: Good Number of Hours of Sleep: 6    Physical Exam: Physical Exam ROS Blood pressure 110/61, pulse 85, temperature 98.1 F (36.7 C), temperature source Oral, resp. rate 17, SpO2 99 %. There is no height or weight on file to calculate BMI.  Treatment Plan Summary: Plan Patient is stable. No SI/HI/AVH. He is lucid, speaking in coherent sentences. He is able to be discharged. Reviewed with EDP  Vanetta Mulders, NP 08/27/2021, 4:14 PM

## 2021-08-27 NOTE — Consult Note (Signed)
[facility] Face-to-Face Psychiatry Consult   Reason for Consult: Mental Health Problems Referring Physician[facility] Patient Identification: Kenneth DumasChristopher D Jensen MRN:  [id number] Principal Diagnosis: <principal problem not specified> Diagnosis:  Active Problems:   Schizoaffective disorder, bipolar type (HCC)   Cannabis use disorder, moderate, dependence (HCC)   Tobacco use disorder   Prediabetes   Total Time spent with patient: 1 hour  Subjective: "I was feeling lonely. So I came to the hospital." Kenneth Jensen is a 31 y.o. male patient  [facility] voluntary. Per the ED triage nurses note, Pt to ED voluntarily for mental health evaluation. Pt yelling and throwing things. Pt taken to triage room 2 with secretary and EDT. Pt dressed out by EDT but pt refusing to talk to this RN. Pt requesting to only talk to Diplomatic Services operational officersecretary. Pt did shake head yes when asked if he was having SI. Pt appears very paranoid.  The patient said he was lonely and decided to come to the hospital. The patient is seen as calm, laughing, and talking with the psychiatric team. The patient does have an unfortunate living situation. He asked if he could stay overnight. Due to his history, it would be beneficial for the patient to remain overnight and be discharged in the morning if he remained stable. This provider saw the patient face-to-face; the chart was reviewed and consulted with Dr. Darnelle CatalanMalinda on 08/26/2021 due to the patient's care. It was discussed with the EDP that the patient would remain overnight and be discharged in the morning if he remained stable. On evaluation, the patient is alert and oriented x 3, calm, cooperative, and mood-congruent with affect. The patient does not appear to be responding to internal and external stimuli. The patient is not presenting with delusional thinking. The patient denies auditory or visual hallucinations. The patient denies any suicidal, homicidal, or self-harm  ideations. The patient is not presenting with any psychotic or paranoid behaviors. During an encounter with the patient, he could answer most questions appropriately. UDS is remarkable for Marijuana, and he remains on his injectable.  HPI:    Past Psychiatric History:  Bipolar 1 disorder (HCC) Depression Schizophrenia (HCC) Schizotaxia  Risk to Self:   Risk to Others:   Prior Inpatient Therapy:   Prior Outpatient Therapy:    Past Medical History:  Past Medical History:  Diagnosis Date   Asthma    Bipolar 1 disorder (HCC)    Depression    Schizophrenia (HCC)    Schizotaxia    No past surgical history on file. Family History: No family history on file. Family Psychiatric  History:  Social History:  Social History   Substance and Sexual Activity  Alcohol Use Yes   Comment: social     Social History   Substance and Sexual Activity  Drug Use Yes   Types: Marijuana    Social History   Socioeconomic History   Marital status: Single    Spouse name: Not on file   Number of children: Not on file   Years of education: Not on file   Highest education level: Not on file  Occupational History   Not on file  Tobacco Use   Smoking status: Every Day    Packs/day: 0.50    Types: Cigarettes   Smokeless tobacco: Never  Substance and Sexual Activity   Alcohol use: Yes    Comment: social   Drug use: Yes    Types: Marijuana   Sexual activity: Not Currently  Other Topics Concern   Not on file  Social History Narrative   ** Merged History Encounter **       Social Determinants of Health   Financial Resource Strain: Not on file  Food Insecurity: Not on file  Transportation Needs: Not on file  Physical Activity: Not on file  Stress: Not on file  Social Connections: Not on file   Additional Social History:    Allergies:   Allergies  Allergen Reactions   Amoxicillin Swelling   Haldol [Haloperidol]     Pt states "I went crazy"   Percocet [Oxycodone-Acetaminophen]     Vicodin [Hydrocodone-Acetaminophen] Itching   Vicodin [Hydrocodone-Acetaminophen]     Labs:  Results for orders placed or performed during the hospital encounter of 08/26/21 (from the past 48 hour(s))  Comprehensive metabolic panel     Status: None   Collection Time: 08/26/21  6:28 PM  Result Value Ref Range   Sodium 137 135 - 145 mmol/L   Potassium 3.8 3.5 - 5.1 mmol/L   Chloride 100 98 - 111 mmol/L   CO2 28 22 - 32 mmol/L   Glucose, Bld 93 70 - 99 mg/dL    Comment: Glucose reference range applies only to samples taken after fasting for at least 8 hours.   BUN 11 6 - 20 mg/dL   Creatinine, Ser 0.17 0.61 - 1.24 mg/dL   Calcium 9.1 8.9 - 51.0 mg/dL   Total Protein 7.8 6.5 - 8.1 g/dL   Albumin 4.3 3.5 - 5.0 g/dL   AST 24 15 - 41 U/L   ALT 14 0 - 44 U/L   Alkaline Phosphatase 56 38 - 126 U/L   Total Bilirubin 0.6 0.3 - 1.2 mg/dL   GFR, Estimated >25 >85 mL/min    Comment: (NOTE) Calculated using the CKD-EPI Creatinine Equation (2021)    Anion gap 9 5 - 15    Comment: Performed at [facility], [address], [place], [place] [address]  Ethanol     Status: None   Collection Time: 08/26/21  6:28 PM  Result Value Ref Range   Alcohol, Ethyl (B) <10 <10 mg/dL    Comment: (NOTE) Lowest detectable limit for serum alcohol is 10 mg/dL.  For medical purposes only. Performed at [facility], [address] [place], [place] [address]   Salicylate level     Status: Abnormal   Collection Time: 08/26/21  6:28 PM  Result Value Ref Range   Salicylate Lvl <7.0 (L) 7.0 - 30.0 mg/dL    Comment: Performed at [facility], [address] [place], [place] [address]  Acetaminophen level     Status: Abnormal   Collection Time: 08/26/21  6:28 PM  Result Value Ref Range   Acetaminophen (Tylenol), Serum <10 (L) 10 - 30 ug/mL    Comment: (NOTE) Therapeutic concentrations vary significantly. A range of 10-30 ug/mL  may be an effective concentration for many  patients. However, some  are best treated at concentrations outside of this range. Acetaminophen concentrations >150 ug/mL at 4 hours after ingestion  and >50 ug/mL at 12 hours after ingestion are often associated with  toxic reactions.  Performed at [facility], [address] [place], [place] [address]   cbc     Status: Abnormal   Collection Time: 08/26/21  6:28 PM  Result Value Ref Range   WBC 11.2 (H) 4.0 - 10.5 K/uL   RBC 5.06 4.22 - 5.81 MIL/uL   Hemoglobin 15.0 13.0 -  17.0 g/dL   HCT 19.1 47.8 - 29.5 %   MCV 87.2 80.0 - 100.0 fL   MCH 29.6 26.0 - 34.0 pg   MCHC 34.0 30.0 - 36.0 g/dL   RDW 62.1 30.8 - 65.7 %   Platelets 301 150 - 400 K/uL   nRBC 0.0 0.0 - 0.2 %    Comment: Performed at [facility], [address] [place], [place] [address]  Urine Drug Screen, Qualitative     Status: Abnormal   Collection Time: 08/26/21  6:29 PM  Result Value Ref Range   Tricyclic, Ur Screen POSITIVE (A) NONE DETECTED   Amphetamines, Ur Screen NONE DETECTED NONE DETECTED   MDMA (Ecstasy)Ur Screen NONE DETECTED NONE DETECTED   Cocaine Metabolite,Ur [place] NONE DETECTED NONE DETECTED   Opiate, Ur Screen NONE DETECTED NONE DETECTED   Phencyclidine (PCP) Ur S NONE DETECTED NONE DETECTED   Cannabinoid 50 Ng, Ur [place] POSITIVE (A) NONE DETECTED   Barbiturates, Ur Screen NONE DETECTED NONE DETECTED   Benzodiazepine, Ur Scrn NONE DETECTED NONE DETECTED   Methadone Scn, Ur NONE DETECTED NONE DETECTED    Comment: (NOTE) Tricyclics + metabolites, urine    Cutoff 1000 ng/mL Amphetamines + metabolites, urine  Cutoff 1000 ng/mL MDMA (Ecstasy), urine              Cutoff 500 ng/mL Cocaine Metabolite, urine          Cutoff 300 ng/mL Opiate + metabolites, urine        Cutoff 300 ng/mL Phencyclidine (PCP), urine         Cutoff 25 ng/mL Cannabinoid, urine                 Cutoff 50 ng/mL Barbiturates + metabolites, urine  Cutoff 200 ng/mL Benzodiazepine, urine              Cutoff 200  ng/mL Methadone, urine                   Cutoff 300 ng/mL  The urine drug screen provides only a preliminary, unconfirmed analytical test result and should not be used for non-medical purposes. Clinical consideration and professional judgment should be applied to any positive drug screen result due to possible interfering substances. A more specific alternate chemical method must be used in order to obtain a confirmed analytical result. Gas chromatography / mass spectrometry (GC/MS) is the preferred confirm atory method. Performed at [facility], [address] [place], [place] [address]   SARS Coronavirus 2 by RT PCR (hospital order, performed in [facility] hospital lab) *cepheid single result test* Anterior Nasal Swab     Status: None   Collection Time: 08/26/21  9:45 PM   Specimen: Anterior Nasal Swab  Result Value Ref Range   SARS Coronavirus 2 by RT PCR NEGATIVE NEGATIVE    Comment: (NOTE) SARS-CoV-2 target nucleic acids are NOT DETECTED.  The SARS-CoV-2 RNA is generally detectable in upper and lower respiratory specimens during the acute phase of infection. The lowest concentration of SARS-CoV-2 viral copies this assay can detect is 250 copies / mL. A negative result does not preclude SARS-CoV-2 infection and should not be used as the sole basis for treatment or other patient management decisions.  A negative result may occur with improper specimen collection / handling, submission of specimen other than nasopharyngeal swab, presence of viral mutation(s) within the areas targeted by this assay, and inadequate number of viral copies (<250 copies / mL). A negative result  must be combined with clinical observations, patient history, and epidemiological information.  Fact Sheet for Patients:   RoadLapTop.co.za  Fact Sheet for Healthcare Providers: http://kim-miller.com/  This test is not yet approved or  cleared by the  [place] FDA and has been authorized for detection and/or diagnosis of SARS-CoV-2 by FDA under an Emergency Use Authorization (EUA).  This EUA will remain in effect (meaning this test can be used) for the duration of the COVID-19 declaration under Section 564(b)(1) of the Act, 21 U.S.C. section 360bbb-3(b)(1), unless the authorization is terminated or revoked sooner.  Performed at [facility], [address] [place], [place] [address]     No current facility-administered medications for this encounter.   Current Outpatient Medications  Medication Sig Dispense Refill   divalproex (DEPAKOTE ER) 500 MG 24 hr tablet Take 1 tablet (500 mg total) by mouth 3 (three) times daily. 90 tablet 1   hydrOXYzine (ATARAX) 25 MG tablet Take 1 tablet (25 mg total) by mouth 3 (three) times daily as needed for anxiety. 30 tablet 1   paliperidone (INVEGA SUSTENNA) 234 MG/1.5ML injection Inject 234 mg into the muscle every 28 (twenty-eight) days. 1.8 mL 1   QUEtiapine (SEROQUEL XR) 400 MG 24 hr tablet Take 1 tablet by mouth daily.     QUEtiapine (SEROQUEL) 400 MG tablet Take 1 tablet (400 mg total) by mouth at bedtime. 30 tablet 1   traZODone (DESYREL) 50 MG tablet Take 1 tablet (50 mg total) by mouth at bedtime as needed for sleep. 30 tablet 0    Musculoskeletal: Strength & Muscle Tone: within normal limits Gait & Station: normal Patient leans: N/A  Psychiatric Specialty Exam:  Presentation  General Appearance: Disheveled  Eye Contact:Good  Speech:Normal Rate; Clear and Coherent  Speech Volume:Normal  Handedness:Right   Mood and Affect  Mood:Euthymic  Affect:Appropriate   Thought Process  Thought Processes:Coherent  Descriptions of Associations:Intact  Orientation:Full (Time, Place and Person)  Thought Content:Logical  History of Schizophrenia/Schizoaffective disorder:Yes  Duration of Psychotic Symptoms:Greater than six months  Hallucinations:Hallucinations:  None  Ideas of Reference:Paranoia  Suicidal Thoughts:Suicidal Thoughts: No  Homicidal Thoughts:Homicidal Thoughts: No   Sensorium  Memory:Immediate Fair; Recent Fair; Remote Fair  Judgment:Fair  Insight:Lacking   Executive Functions  Concentration:Fair  Attention Span:Fair  Recall:Fair  Fund of Knowledge:Fair  Language:Fair   Psychomotor Activity  Psychomotor Activity:Psychomotor Activity: Normal   Assets  Assets:Communication Skills; Desire for Improvement; Resilience; Social Support   Sleep  Sleep:Sleep: Fair Number of Hours of Sleep: 6   Physical Exam: Physical Exam Vitals and nursing note reviewed.  Constitutional:      Appearance: Normal appearance. He is normal weight.  HENT:     Head: Normocephalic and atraumatic.     Right Ear: External ear normal.     Left Ear: External ear normal.     Nose: Nose normal.  Cardiovascular:     Rate and Rhythm: Normal rate.     Pulses: Normal pulses.  Pulmonary:     Effort: Pulmonary effort is normal.  Musculoskeletal:        General: Normal range of motion.     Cervical back: Normal range of motion and neck supple.  Neurological:     General: No focal deficit present.     Mental Status: He is alert and oriented to person, place, and time.  Psychiatric:        Attention and Perception: Attention and perception normal.        Mood and  Affect: Mood is depressed. Affect is blunt.        Speech: Speech normal.        Behavior: Behavior normal. Behavior is cooperative.        Thought Content: Thought content is paranoid.        Cognition and Memory: Cognition normal.        Judgment: Judgment is impulsive and inappropriate.    Review of Systems  Psychiatric/Behavioral:  Positive for depression and substance abuse.   All other systems reviewed and are negative.  Blood pressure 117/80, pulse 83, temperature 98.2 F (36.8 C), resp. rate 16, SpO2 97 %. There is no height or weight on file to calculate  BMI.  Treatment Plan Summary: Plan The patient could be discharged in the morning if he remained stable.  Disposition: No evidence of imminent risk to self or others at present.   Patient does not meet criteria for psychiatric inpatient admission. Supportive therapy provided about ongoing stressors. Discussed crisis plan, support from social network, calling 911, coming to the Emergency Department, and calling Suicide Hotline.  Gillermo Murdoch, NP 08/27/2021 12:28 AM

## 2021-08-27 NOTE — ED Notes (Addendum)
1625 pm - Attempted to call pt to let him know his book has been found. Spoke with contact person listed as "Aunt" due to no patient phone number being listed on pt account. "Celine Ahr' Karel Jarvis did provided phone number [phone] to attempt to reach pt. No answer at that number but voicemail was left. "Aunt" Karel Jarvis called back and informed and she said she would also attempt to reach out to pt neighbor.

## 2021-09-01 ENCOUNTER — Emergency Department
Admission: EM | Admit: 2021-09-01 | Discharge: 2021-09-02 | Disposition: A | Discharge status: Home / Self Care | Payer: Medicare HMO | Attending: Emergency Medicine | Admitting: Emergency Medicine

## 2021-09-01 ENCOUNTER — Other Ambulatory Visit: Payer: Self-pay

## 2021-09-01 DIAGNOSIS — F1290 Cannabis use, unspecified, uncomplicated: Secondary | ICD-10-CM | POA: Diagnosis not present

## 2021-09-01 DIAGNOSIS — Z79899 Other long term (current) drug therapy: Secondary | ICD-10-CM | POA: Diagnosis not present

## 2021-09-01 DIAGNOSIS — F17210 Nicotine dependence, cigarettes, uncomplicated: Secondary | ICD-10-CM | POA: Insufficient documentation

## 2021-09-01 DIAGNOSIS — Z20822 Contact with and (suspected) exposure to covid-19: Secondary | ICD-10-CM | POA: Diagnosis not present

## 2021-09-01 DIAGNOSIS — J45909 Unspecified asthma, uncomplicated: Secondary | ICD-10-CM | POA: Insufficient documentation

## 2021-09-01 DIAGNOSIS — F3132 Bipolar disorder, current episode depressed, moderate: Secondary | ICD-10-CM | POA: Diagnosis not present

## 2021-09-01 DIAGNOSIS — M533 Sacrococcygeal disorders, not elsewhere classified: Secondary | ICD-10-CM | POA: Insufficient documentation

## 2021-09-01 DIAGNOSIS — Z046 Encounter for general psychiatric examination, requested by authority: Secondary | ICD-10-CM | POA: Diagnosis present

## 2021-09-01 DIAGNOSIS — F209 Schizophrenia, unspecified: Secondary | ICD-10-CM | POA: Diagnosis not present

## 2021-09-01 DIAGNOSIS — F250 Schizoaffective disorder, bipolar type: Secondary | ICD-10-CM | POA: Diagnosis not present

## 2021-09-01 LAB — CBC WITH DIFFERENTIAL/PLATELET
Abs Immature Granulocytes: 0.03 10*3/uL (ref 0.00–0.07)
Basophils Absolute: 0 10*3/uL (ref 0.0–0.1)
Basophils Relative: 0 %
Eosinophils Absolute: 0.3 10*3/uL (ref 0.0–0.5)
Eosinophils Relative: 3 %
HCT: 41.2 % (ref 39.0–52.0)
Hemoglobin: 13.9 g/dL (ref 13.0–17.0)
Immature Granulocytes: 0 %
Lymphocytes Relative: 42 %
Lymphs Abs: 4.1 10*3/uL — ABNORMAL HIGH (ref 0.7–4.0)
MCH: 29.3 pg (ref 26.0–34.0)
MCHC: 33.7 g/dL (ref 30.0–36.0)
MCV: 86.9 fL (ref 80.0–100.0)
Monocytes Absolute: 0.8 10*3/uL (ref 0.1–1.0)
Monocytes Relative: 8 %
Neutro Abs: 4.4 10*3/uL (ref 1.7–7.7)
Neutrophils Relative %: 47 %
Platelets: 335 10*3/uL (ref 150–400)
RBC: 4.74 MIL/uL (ref 4.22–5.81)
RDW: 13.1 % (ref 11.5–15.5)
WBC: 9.6 10*3/uL (ref 4.0–10.5)
nRBC: 0 % (ref 0.0–0.2)

## 2021-09-01 LAB — COMPREHENSIVE METABOLIC PANEL
ALT: 11 U/L (ref 0–44)
AST: 22 U/L (ref 15–41)
Albumin: 4.1 g/dL (ref 3.5–5.0)
Alkaline Phosphatase: 57 U/L (ref 38–126)
Anion gap: 11 (ref 5–15)
BUN: 11 mg/dL (ref 6–20)
CO2: 26 mmol/L (ref 22–32)
Calcium: 8.8 mg/dL — ABNORMAL LOW (ref 8.9–10.3)
Chloride: 103 mmol/L (ref 98–111)
Creatinine, Ser: 0.87 mg/dL (ref 0.61–1.24)
GFR, Estimated: >60 mL/min (ref >60)
Glucose, Bld: 124 mg/dL — ABNORMAL HIGH (ref 70–99)
Potassium: 4 mmol/L (ref 3.5–5.1)
Sodium: 140 mmol/L (ref 135–145)
Total Bilirubin: 0.5 mg/dL (ref 0.3–1.2)
Total Protein: 7.3 g/dL (ref 6.5–8.1)

## 2021-09-01 NOTE — ED Triage Notes (Signed)
Pt arrive with c/o SI. Pt stated "I don't why people keep trying me, I told them to stop playing with me." Pt denies HI.

## 2021-09-01 NOTE — ED Notes (Addendum)
Pt Belongings  Black shirt Pants 1 pair of socks 1 pair of shoes 1 coat Wallet Underwear Keys Phone/charger Book

## 2021-09-02 DIAGNOSIS — F250 Schizoaffective disorder, bipolar type: Secondary | ICD-10-CM

## 2021-09-02 DIAGNOSIS — F209 Schizophrenia, unspecified: Secondary | ICD-10-CM | POA: Diagnosis not present

## 2021-09-02 LAB — URINE DRUG SCREEN, QUALITATIVE (ARMC ONLY)
Amphetamines, Ur Screen: NOT DETECTED
Barbiturates, Ur Screen: NOT DETECTED
Benzodiazepine, Ur Scrn: NOT DETECTED
Cannabinoid 50 Ng, Ur ~~LOC~~: POSITIVE — AB
Cocaine Metabolite,Ur ~~LOC~~: NOT DETECTED
MDMA (Ecstasy)Ur Screen: NOT DETECTED
Methadone Scn, Ur: NOT DETECTED
Opiate, Ur Screen: NOT DETECTED
Phencyclidine (PCP) Ur S: NOT DETECTED
Tricyclic, Ur Screen: NOT DETECTED

## 2021-09-02 LAB — RESP PANEL BY RT-PCR (FLU A&B, COVID) ARPGX2
Influenza A by PCR: NEGATIVE
Influenza B by PCR: NEGATIVE
SARS Coronavirus 2 by RT PCR: NEGATIVE

## 2021-09-02 LAB — ETHANOL: Alcohol, Ethyl (B): <10 mg/dL (ref <10)

## 2021-09-02 LAB — VALPROIC ACID LEVEL: Valproic Acid Lvl: 24 ug/mL — ABNORMAL LOW (ref 50.0–100.0)

## 2021-09-02 MED ORDER — ACETAMINOPHEN 500 MG PO TABS
1000.0000 mg | ORAL_TABLET | Freq: Once | ORAL | Status: AC
  Administered 2021-09-02: 1000 mg via ORAL
  Filled 2021-09-02: qty 2

## 2021-09-02 MED ORDER — TRAZODONE HCL 100 MG PO TABS: 50.0000 mg | ORAL_TABLET | Freq: Every evening | ORAL | Status: DC | PRN

## 2021-09-02 MED ORDER — QUETIAPINE FUMARATE 200 MG PO TABS: 400.0000 mg | ORAL_TABLET | Freq: Every day | ORAL | Status: DC

## 2021-09-02 MED ORDER — HYDROXYZINE HCL 25 MG PO TABS: 25.0000 mg | ORAL_TABLET | Freq: Three times a day (TID) | ORAL | Status: DC | PRN

## 2021-09-02 NOTE — Discharge Instructions (Addendum)
You have been seen in the emergency department for a  psychiatric concern. You have been evaluated both medically as well as psychiatrically. Please follow-up with your outpatient resources provided. Return to the emergency department for any worsening symptoms, or any thoughts of hurting yourself or anyone else so that we may attempt to help you. 

## 2021-09-02 NOTE — ED Provider Notes (Addendum)
-----------------------------------------   2:30 PM on 09/02/2021 ----------------------------------------- Patient was complaining of groin pain to the nurse.  I evaluated the patient, appears to have normal external GU exam, nontender testicles no sign of mass or hernia.  No sign of skin infection or other concerning findings on physical exam.  Patient does not seem overly bothered by the discomfort was sleeping comfortably prior to my arrival.  We will continue to monitor.  Patient is asking to be discharged home he is currently voluntary we will discuss with psychiatry for patient disposition help.   Minna Antis, MD 09/02/21 1431  I spoke with the psychiatrist.  They believe the patient is okay to leave if he wishes to leave.  Patient continues to wish to be discharged.  We will discharge the patient per his patient with outpatient resources.    Minna Antis, MD 09/02/21 1500

## 2021-09-02 NOTE — ED Notes (Signed)
Patient having ultrasound at bedside.

## 2021-09-02 NOTE — ED Notes (Signed)
Pt. Transferred to [facility] from ED to room after screening for contraband. Report to include Situation, Background, Assessment and Recommendations from [facility]. Pt. Oriented to unit including Q15 minute rounds as well as the security cameras for their protection. Patient is alert and oriented, warm and dry in no acute distress. Patient denies SI, HI, and AVH. Pt. Encouraged to let me know if needs arise.

## 2021-09-02 NOTE — ED Notes (Signed)
Dr. Lenard Lance at the bedside for pt evaluation. Pt tolerated well. Pt told MD he is wanting to go home and he takes his medications like he is supposed to so he does not need to be here.

## 2021-09-02 NOTE — ED Notes (Signed)
Pt given snack. 

## 2021-09-02 NOTE — Consult Note (Signed)
[facility] Face-to-Face Psychiatry Consult   Reason for Consult: Depression/loneliness Referring Physician:  EDP Patient Identification: Kenneth Jensen MRN:  [id number] Principal Diagnosis: Schizoaffective disorder, bipolar type (HCC) Diagnosis:  Principal Problem:   Schizoaffective disorder, bipolar type (HCC)   Total Time spent with patient: 30 minutes  Subjective: "My  mind be tripping."  Kenneth Jensen is a 31 y.o. male patient admitted with thoughts of feeling unstable. Marland Kitchen  HPI:  Patient presents to the Ed voluntarily with complaints of "just not feeling right." He reports suicidal thoughts without a particular plan. He thinks his family does not care for him. Denies homicidal thoughts. May be somewhat paranoid.  Patient denies auditory or visual hallucinations.  He does not appear to be responding to internal stimuli.  He states that he is taking his medications as directed.  Patient has presented to the ED over last several weeks with similar symptoms.  May consider brief  inpatient psych hospitalization to optimize medications per Dr. Toni Amend; However, patient is voluntary and does not meet criteria for IVC. He appears at baseline, with feelings complaints.  Discussed with patient that he may consider.  He has been here since last seen.  He states that he is ready to go home.  Discussed  follow up with ACT team and asking for support. Patient states he will do that.  Reviewed with Dr. Toni Amend, who agrees.  Past Psychiatric History: schizoaffective disorder, bipolar  type.   Risk to Self:   Risk to Others:   Prior Inpatient Therapy:   Prior Outpatient Therapy:    Past Medical History:  Past Medical History:  Diagnosis Date   Asthma    Bipolar 1 disorder (HCC)    Depression    Schizophrenia (HCC)    Schizotaxia    History reviewed. No pertinent surgical history. Family History: History reviewed. No pertinent family history. Family Psychiatric  History:  Social History:   Social History   Substance and Sexual Activity  Alcohol Use Yes   Comment: social     Social History   Substance and Sexual Activity  Drug Use Yes   Types: Marijuana    Social History   Socioeconomic History   Marital status: Single    Spouse name: Not on file   Number of children: Not on file   Years of education: Not on file   Highest education level: Not on file  Occupational History   Not on file  Tobacco Use   Smoking status: Every Day    Packs/day: 0.50    Types: Cigarettes   Smokeless tobacco: Never  Substance and Sexual Activity   Alcohol use: Yes    Comment: social   Drug use: Yes    Types: Marijuana   Sexual activity: Not Currently  Other Topics Concern   Not on file  Social History Narrative   ** Merged History Encounter **       Social Determinants of Health   Financial Resource Strain: Not on file  Food Insecurity: Not on file  Transportation Needs: Not on file  Physical Activity: Not on file  Stress: Not on file  Social Connections: Not on file   Additional Social History:    Allergies:   Allergies  Allergen Reactions   Amoxicillin Swelling   Haldol [Haloperidol]     Pt states "I went crazy"   Percocet [Oxycodone-Acetaminophen]    Vicodin [Hydrocodone-Acetaminophen] Itching   Vicodin [Hydrocodone-Acetaminophen]     Labs:  Results for orders placed or performed  during the hospital encounter of 09/01/21 (from the past 48 hour(s))  Resp Panel by RT-PCR (Flu A&B, Covid) Anterior Nasal Swab     Status: None   Collection Time: 09/01/21 11:16 PM   Specimen: Anterior Nasal Swab  Result Value Ref Range   SARS Coronavirus 2 by RT PCR NEGATIVE NEGATIVE    Comment: (NOTE) SARS-CoV-2 target nucleic acids are NOT DETECTED.  The SARS-CoV-2 RNA is generally detectable in upper respiratory specimens during the acute phase of infection. The lowest concentration of SARS-CoV-2 viral copies this assay can detect is 138 copies/mL. A negative result  does not preclude SARS-Cov-2 infection and should not be used as the sole basis for treatment or other patient management decisions. A negative result may occur with  improper specimen collection/handling, submission of specimen other than nasopharyngeal swab, presence of viral mutation(s) within the areas targeted by this assay, and inadequate number of viral copies(<138 copies/mL). A negative result must be combined with clinical observations, patient history, and epidemiological information. The expected result is Negative.  Fact Sheet for Patients:  BloggerCourse.com  Fact Sheet for Healthcare Providers:  SeriousBroker.it  This test is no t yet approved or cleared by the [place] FDA and  has been authorized for detection and/or diagnosis of SARS-CoV-2 by FDA under an Emergency Use Authorization (EUA). This EUA will remain  in effect (meaning this test can be used) for the duration of the COVID-19 declaration under Section 564(b)(1) of the Act, 21 U.S.C.section 360bbb-3(b)(1), unless the authorization is terminated  or revoked sooner.       Influenza A by PCR NEGATIVE NEGATIVE   Influenza B by PCR NEGATIVE NEGATIVE    Comment: (NOTE) The Xpert Xpress SARS-CoV-2/FLU/RSV plus assay is intended as an aid in the diagnosis of influenza from Nasopharyngeal swab specimens and should not be used as a sole basis for treatment. Nasal washings and aspirates are unacceptable for Xpert Xpress SARS-CoV-2/FLU/RSV testing.  Fact Sheet for Patients: BloggerCourse.com  Fact Sheet for Healthcare Providers: SeriousBroker.it  This test is not yet approved or cleared by the [place] FDA and has been authorized for detection and/or diagnosis of SARS-CoV-2 by FDA under an Emergency Use Authorization (EUA). This EUA will remain in effect (meaning this test can be used) for the duration  of the COVID-19 declaration under Section 564(b)(1) of the Act, 21 U.S.C. section 360bbb-3(b)(1), unless the authorization is terminated or revoked.  Performed at [facility], [address] [place], [place] [address]   Comprehensive metabolic panel     Status: Abnormal   Collection Time: 09/01/21 11:16 PM  Result Value Ref Range   Sodium 140 135 - 145 mmol/L   Potassium 4.0 3.5 - 5.1 mmol/L   Chloride 103 98 - 111 mmol/L   CO2 26 22 - 32 mmol/L   Glucose, Bld 124 (H) 70 - 99 mg/dL    Comment: Glucose reference range applies only to samples taken after fasting for at least 8 hours.   BUN 11 6 - 20 mg/dL   Creatinine, Ser 3.57 0.61 - 1.24 mg/dL   Calcium 8.8 (L) 8.9 - 10.3 mg/dL   Total Protein 7.3 6.5 - 8.1 g/dL   Albumin 4.1 3.5 - 5.0 g/dL   AST 22 15 - 41 U/L   ALT 11 0 - 44 U/L   Alkaline Phosphatase 57 38 - 126 U/L   Total Bilirubin 0.5 0.3 - 1.2 mg/dL   GFR, Estimated >01 >77 mL/min    Comment: (NOTE)  Calculated using the CKD-EPI Creatinine Equation (2021)    Anion gap 11 5 - 15    Comment: Performed at [facility], [address] [place], [place] [address]  Ethanol     Status: None   Collection Time: 09/01/21 11:16 PM  Result Value Ref Range   Alcohol, Ethyl (B) <10 <10 mg/dL    Comment: (NOTE) Lowest detectable limit for serum alcohol is 10 mg/dL.  For medical purposes only. Performed at [facility], [address] [place], [place] [address]   Urine Drug Screen, Qualitative     Status: Abnormal   Collection Time: 09/01/21 11:16 PM  Result Value Ref Range   Tricyclic, Ur Screen NONE DETECTED NONE DETECTED   Amphetamines, Ur Screen NONE DETECTED NONE DETECTED   MDMA (Ecstasy)Ur Screen NONE DETECTED NONE DETECTED   Cocaine Metabolite,Ur [place] NONE DETECTED NONE DETECTED   Opiate, Ur Screen NONE DETECTED NONE DETECTED   Phencyclidine (PCP) Ur S NONE DETECTED NONE DETECTED   Cannabinoid 50 Ng, Ur [place] POSITIVE (A) NONE DETECTED    Barbiturates, Ur Screen NONE DETECTED NONE DETECTED   Benzodiazepine, Ur Scrn NONE DETECTED NONE DETECTED   Methadone Scn, Ur NONE DETECTED NONE DETECTED    Comment: (NOTE) Tricyclics + metabolites, urine    Cutoff 1000 ng/mL Amphetamines + metabolites, urine  Cutoff 1000 ng/mL MDMA (Ecstasy), urine              Cutoff 500 ng/mL Cocaine Metabolite, urine          Cutoff 300 ng/mL Opiate + metabolites, urine        Cutoff 300 ng/mL Phencyclidine (PCP), urine         Cutoff 25 ng/mL Cannabinoid, urine                 Cutoff 50 ng/mL Barbiturates + metabolites, urine  Cutoff 200 ng/mL Benzodiazepine, urine              Cutoff 200 ng/mL Methadone, urine                   Cutoff 300 ng/mL  The urine drug screen provides only a preliminary, unconfirmed analytical test result and should not be used for non-medical purposes. Clinical consideration and professional judgment should be applied to any positive drug screen result due to possible interfering substances. A more specific alternate chemical method must be used in order to obtain a confirmed analytical result. Gas chromatography / mass spectrometry (GC/MS) is the preferred confirm atory method. Performed at [facility], [address] [place], [place] [address]   CBC with Diff     Status: Abnormal   Collection Time: 09/01/21 11:16 PM  Result Value Ref Range   WBC 9.6 4.0 - 10.5 K/uL   RBC 4.74 4.22 - 5.81 MIL/uL   Hemoglobin 13.9 13.0 - 17.0 g/dL   HCT 96.241.2 95.239.0 - 84.152.0 %   MCV 86.9 80.0 - 100.0 fL   MCH 29.3 26.0 - 34.0 pg   MCHC 33.7 30.0 - 36.0 g/dL   RDW 32.413.1 40.111.5 - 02.715.5 %   Platelets 335 150 - 400 K/uL   nRBC 0.0 0.0 - 0.2 %   Neutrophils Relative % 47 %   Neutro Abs 4.4 1.7 - 7.7 K/uL   Lymphocytes Relative 42 %   Lymphs Abs 4.1 (H) 0.7 - 4.0 K/uL   Monocytes Relative 8 %   Monocytes Absolute 0.8 0.1 - 1.0 K/uL   Eosinophils Relative 3 %  Eosinophils Absolute 0.3 0.0 - 0.5 K/uL   Basophils Relative 0 %    Basophils Absolute 0.0 0.0 - 0.1 K/uL   Immature Granulocytes 0 %   Abs Immature Granulocytes 0.03 0.00 - 0.07 K/uL    Comment: Performed at [facility], [address] [place], [place] [address]    Current Facility-Administered Medications  Medication Dose Route Frequency Provider Last Rate Last Admin   hydrOXYzine (ATARAX) tablet 25 mg  25 mg Oral TID PRN Vanetta Mulders, NP       QUEtiapine (SEROQUEL) tablet 400 mg  400 mg Oral QHS Vanetta Mulders, NP       traZODone (DESYREL) tablet 50 mg  50 mg Oral QHS PRN Vanetta Mulders, NP       Current Outpatient Medications  Medication Sig Dispense Refill   divalproex (DEPAKOTE ER) 500 MG 24 hr tablet Take 1 tablet (500 mg total) by mouth 3 (three) times daily. 90 tablet 1   hydrOXYzine (ATARAX) 25 MG tablet Take 1 tablet (25 mg total) by mouth 3 (three) times daily as needed for anxiety. 30 tablet 1   paliperidone (INVEGA SUSTENNA) 234 MG/1.5ML injection Inject 234 mg into the muscle every 28 (twenty-eight) days. 1.8 mL 1   QUEtiapine (SEROQUEL XR) 400 MG 24 hr tablet Take 1 tablet by mouth daily.     QUEtiapine (SEROQUEL) 400 MG tablet Take 1 tablet (400 mg total) by mouth at bedtime. 30 tablet 1   traZODone (DESYREL) 50 MG tablet Take 1 tablet (50 mg total) by mouth at bedtime as needed for sleep. 30 tablet 0    Musculoskeletal: Strength & Muscle Tone: within normal limits Gait & Station: normal Patient leans: N/A  Psychiatric Specialty Exam:  Presentation  General Appearance: Casual  Eye Contact:Good  Speech:Clear and Coherent  Speech Volume:Normal  Handedness:Right   Mood and Affect  Mood:Euthymic  Affect:Congruent   Thought Process  Thought Processes:Coherent  Descriptions of Associations:Intact  Orientation:Full (Time, Place and Person)  Thought Content:WDL  History of Schizophrenia/Schizoaffective disorder:Yes  Duration of Psychotic Symptoms:Greater than six  months  Hallucinations:Hallucinations: None  Ideas of Reference:None  Suicidal Thoughts:Suicidal Thoughts: No  Homicidal Thoughts:Homicidal Thoughts: No   Sensorium  Memory:Immediate Good; Recent Fair  Judgment:Fair  Insight:Poor   Executive Functions  Concentration:Fair  Attention Span:Fair  Recall:Fair  Fund of Knowledge:Fair  Language:Fair   Psychomotor Activity  Psychomotor Activity:Psychomotor Activity: Normal   Assets  Assets:Communication Skills; Desire for Improvement; Housing; Resilience; Social Support; Physical Health   Sleep  Sleep:Sleep: Good   Physical Exam: Physical Exam Vitals and nursing note reviewed.  HENT:     Head: Normocephalic.     Nose: No congestion or rhinorrhea.  Eyes:     General:        Right eye: No discharge.        Left eye: No discharge.  Cardiovascular:     Rate and Rhythm: Normal rate.  Pulmonary:     Effort: Pulmonary effort is normal.  Musculoskeletal:        General: Normal range of motion.  Neurological:     Mental Status: He is alert and oriented to person, place, and time.    Review of Systems  Constitutional: Negative.   HENT: Negative.    Respiratory: Negative.    Cardiovascular: Negative.   Musculoskeletal: Negative.   Skin: Negative.   Psychiatric/Behavioral:  Positive for substance abuse (UDS positive for cannabinoids only). Negative for depression, hallucinations, memory loss and suicidal ideas.  The patient is not nervous/anxious and does not have insomnia.        Schizoaffective disorder, bipolar type   Blood pressure (!) 155/87, pulse 82, temperature 98 F (36.7 C), temperature source Oral, resp. rate 17, height 5\' 5"  (1.651 m), weight 90.7 kg, SpO2 99 %. Body mass index is 33.28 kg/m.  Treatment Plan Summary: Follow up with ACT team.  Reviewed with Dr. and ED provider.  Return if symptoms worsen.  Disposition: No evidence of imminent risk to self or others at present.    Supportive therapy provided about ongoing stressors. Discussed crisis plan, support from social network, calling 911, coming to the Emergency Department, and calling Suicide Hotline.  Toni Amend, NP 09/02/2021 7:04 PM
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
## 2021-09-02 NOTE — ED Notes (Signed)
Pt to nurses station saying he is ready to go home. Pt told it was recommended that he be admitted to the hospital. Pt states he just wants to leave because he feels locked up and that he has not done anything wrong. Pt reassured and told MD would be by to see him and update him on his plan of care.

## 2021-09-02 NOTE — ED Notes (Signed)
Pt c/o pain to his penis/testicles. MD notified.

## 2021-09-02 NOTE — ED Provider Notes (Signed)
[facility] Provider Note    Event Date/Time   First MD Initiated Contact with Patient 09/01/21 2341     (approximate)   History   Psychiatric Evaluation   HPI  Kenneth Jensen is a 31 y.o. male who presents to the ED with a chief complaint of SI without plan.  Patient with a history of bipolar disorder and schizophrenia.  Denies active HI/AH/VH.  Medical complaints of buttock pain.  Does not remember if he fell or suffered other injuries; states he sits hard on his bottom.     Past Medical History   Past Medical History:  Diagnosis Date   Asthma    Bipolar 1 disorder (HCC)    Depression    Schizophrenia [facility])    Schizotaxia      Active Problem List   Patient Active Problem List   Diagnosis Date Noted   Prediabetes 05/16/2020   Schizoaffective disorder, bipolar type (HCC) 03/25/2019   Cannabis use disorder, moderate, dependence (HCC) 04/21/2011   Tobacco use disorder 04/21/2011     Past Surgical History  History reviewed. No pertinent surgical history.   Home Medications   Prior to Admission medications   Medication Sig Start Date End Date Taking? Authorizing Provider  divalproex (DEPAKOTE ER) 500 MG 24 hr tablet Take 1 tablet (500 mg total) by mouth 3 (three) times daily. 05/09/21   Clapacs, Jackquline Denmark, MD  hydrOXYzine (ATARAX) 25 MG tablet Take 1 tablet (25 mg total) by mouth 3 (three) times daily as needed for anxiety. 05/09/21   Clapacs, Jackquline Denmark, MD  paliperidone (INVEGA SUSTENNA) 234 MG/1.5ML injection Inject 234 mg into the muscle every 28 (twenty-eight) days. 06/04/21   Clapacs, Jackquline Denmark, MD  QUEtiapine (SEROQUEL XR) 400 MG 24 hr tablet Take 1 tablet by mouth daily. 08/15/21   [provider]  QUEtiapine (SEROQUEL) 400 MG tablet Take 1 tablet (400 mg total) by mouth at bedtime. 05/09/21   Clapacs, Jackquline Denmark, MD  traZODone (DESYREL) 50 MG tablet Take 1 tablet (50 mg total) by mouth at bedtime as needed for sleep. 07/12/21 08/11/21  Charm Rings, NP     Allergies  Amoxicillin, Haldol [haloperidol], Percocet [oxycodone-acetaminophen], Vicodin [hydrocodone-acetaminophen], and Vicodin [hydrocodone-acetaminophen]   Family History  History reviewed. No pertinent family history.   Physical Exam  Triage Vital Signs: ED Triage Vitals  Enc Vitals Group     BP 09/01/21 2327 134/80     Pulse Rate 09/01/21 2327 82     Resp 09/01/21 2327 16     Temp 09/01/21 2327 99.4 F (37.4 C)     Temp Source 09/01/21 2327 Oral     SpO2 09/01/21 2327 97 %     Weight 09/01/21 2313 200 lb (90.7 kg)     Height 09/01/21 2313 5\' 5"  (1.651 m)     Head Circumference --      Peak Flow --      Pain Score 09/01/21 2313 0     Pain Loc --      Pain Edu? --      Excl. in GC? --     Updated Vital Signs: BP 134/80 (BP Location: Right Arm)   Pulse 82   Temp 99.4 F (37.4 C) (Oral)   Resp 16   Ht 5\' 5"  (1.651 m)   Wt 90.7 kg   SpO2 97%   BMI 33.28 kg/m    General: Awake, no distress.  CV:  RRR.  Good peripheral  perfusion.  Resp:  Normal effort.  CTA B. Abd:  Nontender.  No distention.  Other:  Flat affect.  External exam of buttocks revealed no abnormality.  Mildly tender to coccyx without deformity.  No external hemorrhoids noted.   ED Results / Procedures / Treatments  Labs (all labs ordered are listed, but only abnormal results are displayed) Labs Reviewed  COMPREHENSIVE METABOLIC PANEL - Abnormal; Notable for the following components:      Result Value   Glucose, Bld 124 (*)    Calcium 8.8 (*)    All other components within normal limits  URINE DRUG SCREEN, QUALITATIVE ([facility] ONLY) - Abnormal; Notable for the following components:   Cannabinoid 50 Ng, Ur Call[place]han POSITIVE (*)    All other components within normal limits  CBC WITH DIFFERENTIAL/PLATELET - Abnormal; Notable for the following components:   Lymphs Abs 4.1 (*)    All other components within normal limits  RESP PANEL BY RT-PCR (FLU A&B, COVID) ARPGX2  ETHANOL      EKG  None   RADIOLOGY None   Official radiology report(s): No results found.   PROCEDURES:  Critical Care performed: No  Procedures   MEDICATIONS ORDERED IN ED: Medications  acetaminophen (TYLENOL) tablet 1,000 mg (1,000 mg Oral Given 09/02/21 0026)     IMPRESSION / MDM / ASSESSMENT AND PLAN / ED COURSE  I reviewed the triage vital signs and the nursing notes.                             31 year old male presenting with vague SI.  Contracts for safety while in the emergency department. The patient has been placed in psychiatric observation due to the need to provide a safe environment for the patient while obtaining psychiatric consultation and evaluation, as well as ongoing medical and medication management to treat the patient's condition.  The patient has not been placed under full IVC at this time.  Tylenol ordered for buttock pain.  Patient's presentation is most consistent with exacerbation of chronic illness.  [facility] Psychiatric NP recommends reassessment in the morning.   FINAL CLINICAL IMPRESSION(S) / ED DIAGNOSES   Final diagnoses:  Schizophrenia, unspecified type (HCC)  Bipolar affective disorder, currently depressed, moderate (HCC)  Marijuana use     Rx / DC Orders   ED Discharge Orders     None        Note:  This document was prepared using Dragon voice recognition software and may include unintentional dictation errors.   Irean Hong, MD 09/02/21 [phone]

## 2021-09-02 NOTE — ED Notes (Signed)
Louise NP with pt to discuss plan of care with pt due to him expressing wanting to be discharged home instead of following through with his inpatient recommendation.

## 2021-09-02 NOTE — ED Notes (Signed)
VOL/ Consult Pending  

## 2021-11-21 ENCOUNTER — Emergency Department
Admission: EM | Admit: 2021-11-21 | Discharge: 2021-11-22 | Disposition: A | Discharge status: Home / Self Care | Payer: Medicare HMO | Attending: Emergency Medicine | Admitting: Emergency Medicine

## 2021-11-21 ENCOUNTER — Other Ambulatory Visit: Payer: Self-pay

## 2021-11-21 DIAGNOSIS — F1220 Cannabis dependence, uncomplicated: Secondary | ICD-10-CM | POA: Diagnosis present

## 2021-11-21 DIAGNOSIS — Z79899 Other long term (current) drug therapy: Secondary | ICD-10-CM | POA: Insufficient documentation

## 2021-11-21 DIAGNOSIS — F23 Brief psychotic disorder: Secondary | ICD-10-CM | POA: Diagnosis not present

## 2021-11-21 DIAGNOSIS — R42 Dizziness and giddiness: Secondary | ICD-10-CM | POA: Diagnosis not present

## 2021-11-21 DIAGNOSIS — Z20822 Contact with and (suspected) exposure to covid-19: Secondary | ICD-10-CM | POA: Diagnosis not present

## 2021-11-21 DIAGNOSIS — F17200 Nicotine dependence, unspecified, uncomplicated: Secondary | ICD-10-CM | POA: Diagnosis present

## 2021-11-21 DIAGNOSIS — F17210 Nicotine dependence, cigarettes, uncomplicated: Secondary | ICD-10-CM | POA: Insufficient documentation

## 2021-11-21 DIAGNOSIS — F1410 Cocaine abuse, uncomplicated: Secondary | ICD-10-CM | POA: Diagnosis present

## 2021-11-21 DIAGNOSIS — F14929 Cocaine use, unspecified with intoxication, unspecified: Secondary | ICD-10-CM

## 2021-11-21 DIAGNOSIS — F14129 Cocaine abuse with intoxication, unspecified: Secondary | ICD-10-CM | POA: Insufficient documentation

## 2021-11-21 DIAGNOSIS — J45909 Unspecified asthma, uncomplicated: Secondary | ICD-10-CM | POA: Insufficient documentation

## 2021-11-21 DIAGNOSIS — F250 Schizoaffective disorder, bipolar type: Secondary | ICD-10-CM | POA: Insufficient documentation

## 2021-11-21 DIAGNOSIS — F29 Unspecified psychosis not due to a substance or known physiological condition: Secondary | ICD-10-CM | POA: Diagnosis not present

## 2021-11-21 DIAGNOSIS — F209 Schizophrenia, unspecified: Secondary | ICD-10-CM | POA: Diagnosis present

## 2021-11-21 LAB — COMPREHENSIVE METABOLIC PANEL
ALT: 14 U/L (ref 0–44)
AST: 25 U/L (ref 15–41)
Albumin: 4.7 g/dL (ref 3.5–5.0)
Alkaline Phosphatase: 60 U/L (ref 38–126)
Anion gap: 12 (ref 5–15)
BUN: 14 mg/dL (ref 6–20)
CO2: 24 mmol/L (ref 22–32)
Calcium: 9.3 mg/dL (ref 8.9–10.3)
Chloride: 102 mmol/L (ref 98–111)
Creatinine, Ser: 0.81 mg/dL (ref 0.61–1.24)
GFR, Estimated: >60 mL/min (ref >60)
Glucose, Bld: 96 mg/dL (ref 70–99)
Potassium: 3.6 mmol/L (ref 3.5–5.1)
Sodium: 138 mmol/L (ref 135–145)
Total Bilirubin: 1.1 mg/dL (ref 0.3–1.2)
Total Protein: 8.2 g/dL — ABNORMAL HIGH (ref 6.5–8.1)

## 2021-11-21 LAB — URINE DRUG SCREEN, QUALITATIVE (ARMC ONLY)
Amphetamines, Ur Screen: NOT DETECTED
Barbiturates, Ur Screen: NOT DETECTED
Benzodiazepine, Ur Scrn: NOT DETECTED
Cannabinoid 50 Ng, Ur ~~LOC~~: POSITIVE — AB
Cocaine Metabolite,Ur ~~LOC~~: POSITIVE — AB
MDMA (Ecstasy)Ur Screen: NOT DETECTED
Methadone Scn, Ur: NOT DETECTED
Opiate, Ur Screen: NOT DETECTED
Phencyclidine (PCP) Ur S: NOT DETECTED
Tricyclic, Ur Screen: POSITIVE — AB

## 2021-11-21 LAB — CBC
HCT: 42.2 % (ref 39.0–52.0)
Hemoglobin: 14.4 g/dL (ref 13.0–17.0)
MCH: 29.1 pg (ref 26.0–34.0)
MCHC: 34.1 g/dL (ref 30.0–36.0)
MCV: 85.4 fL (ref 80.0–100.0)
Platelets: 333 10*3/uL (ref 150–400)
RBC: 4.94 MIL/uL (ref 4.22–5.81)
RDW: 12.7 % (ref 11.5–15.5)
WBC: 8.4 10*3/uL (ref 4.0–10.5)
nRBC: 0 % (ref 0.0–0.2)

## 2021-11-21 LAB — ETHANOL: Alcohol, Ethyl (B): <10 mg/dL (ref <10)

## 2021-11-21 LAB — ACETAMINOPHEN LEVEL: Acetaminophen (Tylenol), Serum: <10 ug/mL — ABNORMAL LOW (ref 10–30)

## 2021-11-21 LAB — SALICYLATE LEVEL: Salicylate Lvl: <7 mg/dL — ABNORMAL LOW (ref 7.0–30.0)

## 2021-11-21 LAB — SARS CORONAVIRUS 2 BY RT PCR: SARS Coronavirus 2 by RT PCR: NEGATIVE

## 2021-11-21 MED ORDER — HYDROXYZINE HCL 25 MG PO TABS
25.0000 mg | ORAL_TABLET | Freq: Once | ORAL | Status: AC
  Administered 2021-11-21: 25 mg via ORAL
  Filled 2021-11-21: qty 1

## 2021-11-21 MED ORDER — DIVALPROEX SODIUM 500 MG PO DR TAB
500.0000 mg | DELAYED_RELEASE_TABLET | Freq: Once | ORAL | Status: AC
  Administered 2021-11-21: 500 mg via ORAL
  Filled 2021-11-21: qty 1

## 2021-11-21 MED ORDER — QUETIAPINE FUMARATE ER 200 MG PO TB24
400.0000 mg | ORAL_TABLET | Freq: Every day | ORAL | Status: DC
  Administered 2021-11-21 – 2021-11-22 (×2): 400 mg via ORAL
  Filled 2021-11-21 (×2): qty 2

## 2021-11-21 NOTE — BH Assessment (Signed)
Comprehensive Clinical Assessment (CCA) Note  11/21/2021 Kenneth Jensen [id number]  Chief Complaint:  Chief Complaint  Patient presents with   COVID TEST   Visit Diagnosis: Schizophrenia   Kenneth Jensen. Kenneth Jensen is a 31 year old male who presents to the ER stating he has COVID. Per his report, his ACT Team isn't giving him his medications and "they do what they want." While sharing this, the patient became tearful and then became upset because he "know that lady down there and she need to get out." He initially reported he knew the staff member, who was several feet down the hall talking with another patient. When asked how he knew her? He stated, "I don't know her, she keep fucking with me." Throughout the interview, he was liable and his mood was incongruent with what he was saying. Spoke with [facility] staff and he received his last injection of [place] 234mg  on November 9th, 2023. Patient denies SI/HI and AV/H. However, he has been witnessed by ER staff responding to internal stimuli.  CCA Screening, Triage and Referral (STR)  Patient Reported Information How did you hear about November 11th, 2023? Self  What Is the Reason for Your Visit/Call Today? Patient have history of schizophrenia and may not be taking his medications.  How Long Has This Been Causing You Problems? 1 wk - 1 month  What Do You Feel Would Help You the Most Today? Treatment for Depression or other mood problem; Alcohol or Drug Use Treatment   Have You Recently Had Any Thoughts About Hurting Yourself? No  Are You Planning to Commit Suicide/Harm Yourself At This time? No   Flowsheet Row ED from 11/21/2021 in [facility] EMERGENCY DEPARTMENT ED from 09/01/2021 in [facility] EM[facility]GENCY DEPARTMENT Admission (Discharged) from 07/09/2021 in [facility] INPATIENT BEHAVIORAL MEDICINE  C-SSRS RISK CATEGORY No Risk Low Risk Low Risk       Have you Recently Had Thoughts About Hurting Someone [facility]?  No  Are You Planning to Harm Someone at This Time? No  Explanation: No data recorded  Have You Used Any Alcohol or Drugs in the Past 24 Hours? No  What Did You Use and How Much? Pt denies use   Do You Currently Have a Therapist/Psychiatrist? Yes  Name of Therapist/Psychiatrist: Name of Therapist/Psychiatrist: [facility] ACT Team   Have You Been Recently Discharged From Any Office Practice or Programs? No  Explanation of Discharge From Practice/Program: No data recorded    CCA Screening Triage Referral Assessment Type of Contact: Face-to-Face  Telemedicine Service Delivery:   Is this Initial or Reassessment?   Date Telepsych consult ordered in [facility]:    Time Telepsych consult ordered in [facility]:    Location of Assessment: [facility] ED  Provider Location: [facility] ED   Collateral Involvement: n/a   Does Patient Have a Court Appointed Legal Guardian? No -- (Aunt)  Legal Guardian Contact Information: [facility]  Copy of Legal Guardianship Form: No data recorded Legal Guardian Notified of Arrival: -- (Guardian is aware of Pts location)  Legal Guardian Notified of Pending Discharge: No data recorded If Minor and Not Living with Parent(s), Who has Custody? n/a  Is CPS involved or ever been involved? Never  Is APS involved or ever been involved? Never   Patient Determined To Be At Risk for Harm To Self or Others Based on Review of Patient Reported Information or Presenting Complaint? No  Method: Plan with intent and identified person  Availability of Means: No access or NA  Intent:  Vague intent or NA  Notification Required: Identifiable person is aware  Additional Information for Danger to Others Potential: Active psychosis  Additional Comments for Danger to Others Potential: n/a  Are There Guns or Other Weapons in Your Home? No  Types of Guns/Weapons: No data recorded Are These Weapons Safely Secured?                            No data recorded Who Could Verify You Are  Able To Have These Secured: No data recorded Do You Have any Outstanding Charges, Pending Court Dates, Parole/Probation? None  Contacted To Inform of Risk of Harm To Self or Others: Other: Comment    Does Patient Present under Involuntary Commitment? No    [place] of Residence: [place]   Patient Currently Receiving the Following Services: ACTT Psychologist, educational)   Determination of Need: Emergent (2 hours)   Options For Referral: ED Visit     CCA Biopsychosocial Patient Reported Schizophrenia/Schizoaffective Diagnosis in Past: Yes   Strengths: Some insight, seeking help, receive treatment services.   Mental Health Symptoms Depression:   Tearfulness; Difficulty Concentrating   Duration of Depressive symptoms:  Duration of Depressive Symptoms: Greater than two weeks   Mania:   N/A   Anxiety:    Tension; Restlessness; Irritability   Psychosis:   Affective flattening/alogia/avolition; Hallucinations   Duration of Psychotic symptoms:  Duration of Psychotic Symptoms: Greater than six months   Trauma:   N/A   Obsessions:   N/A   Compulsions:   N/A   Inattention:   Does not follow instructions (not oppositional)   Hyperactivity/Impulsivity:   N/A   Oppositional/Defiant Behaviors:   N/A   Emotional Irregularity:   N/A   Other Mood/Personality Symptoms:  No data recorded   Mental Status Exam Appearance and self-care  Stature:   Average   Weight:   Average weight   Clothing:   Disheveled   Grooming:   Neglected   Cosmetic use:   None   Posture/gait:   Normal   Motor activity:   -- (Within normal range)   Sensorium  Attention:   Distractible   Concentration:   Focuses on irrelevancies; Scattered   Orientation:   Person; Place; Object   Recall/memory:   Normal   Affect and Mood  Affect:   Anxious; Labile   Mood:   Irritable   Relating  Eye contact:   Normal   Facial expression:   Responsive    Attitude toward examiner:   Cooperative   Thought and Language  Speech flow:  Flight of Ideas   Thought content:   Appropriate to Mood and Circumstances   Preoccupation:   Ruminations   Hallucinations:   Auditory   Organization:   Environmental manager of Knowledge:   Fair   Intelligence:   Average   Abstraction:   Functional   Judgement:   Fair   Dance movement psychotherapist:   Distorted   Insight:   Poor   Decision Making:   Confused   Social Functioning  Social Maturity:   Impulsive; Responsible   Social Judgement:   Chemical engineer"; Heedless   Stress  Stressors:   Housing; Surveyor, quantity; Transitions   Coping Ability:   Exhausted; Deficient supports   Skill Deficits:   Self-care   Supports:   Friends/Service system     Religion: Religion/Spirituality Are You A Religious Person?: No  Leisure/Recreation: Leisure / Recreation Do  You Have Hobbies?: No  Exercise/Diet: Exercise/Diet Do You Exercise?: No Have You Gained or Lost A Significant Amount of Weight in the Past Six Months?: No Do You Follow a Special Diet?: No Do You Have Any Trouble Sleeping?: No   CCA Employment/Education Employment/Work Situation:    Education:     CCA Family/Childhood History Family and Relationship History: Family history Marital status: Single Does patient have children?: No  Childhood History:  Childhood History By whom was/is the patient raised?: Mother Did patient suffer any verbal/emotional/physical/sexual abuse as a child?: No Did patient suffer from severe childhood neglect?: No Has patient ever been sexually abused/assaulted/raped as an adolescent or adult?: No Was the patient ever a victim of a crime or a disaster?: No Witnessed domestic violence?: No Has patient been affected by domestic violence as an adult?: No   CCA Substance Use Alcohol/Drug Use: Alcohol / Drug Use Pain Medications: See PTA Prescriptions: See PTA Over  the Counter: See PTA History of alcohol / drug use?: Yes Longest period of sobriety (when/how long): Unable to quantify Substance #1 Name of Substance 1: Cannabis 1 - Frequency: Unable to quantify 1 - Last Use / Amount: Unable to quantify    ASAM's:  Six Dimensions of Multidimensional Assessment  Dimension 1:  Acute Intoxication and/or Withdrawal Potential:      Dimension 2:  Biomedical Conditions and Complications:      Dimension 3:  Emotional, Behavioral, or Cognitive Conditions and Complications:     Dimension 4:  Readiness to Change:     Dimension 5:  Relapse, Continued use, or Continued Problem Potential:     Dimension 6:  Recovery/Living Environment:     ASAM Severity Score:    ASAM Recommended Level of Treatment:     Substance use Disorder (SUD)    Recommendations for Services/Supports/Treatments:    Discharge Disposition:    DSM5 Diagnoses: Patient Active Problem List   Diagnosis Date Noted   Prediabetes 05/16/2020   Schizoaffective disorder, bipolar type (HCC) 03/25/2019   Cannabis use disorder, moderate, dependence (HCC) 04/21/2011   Tobacco use disorder 04/21/2011    Referrals to Alternative Service(s): Referred to Alternative Service(s):   Place:   Date:   Time:    Referred to Alternative Service(s):   Place:   Date:   Time:    Referred to Alternative Service(s):   Place:   Date:   Time:    Referred to Alternative Service(s):   Place:   Date:   Time:      Lilyan Gilford MS, LCAS, [facility], [facility] Therapeutic Triage Specialist 11/21/2021 5:14 PM

## 2021-11-21 NOTE — Consult Note (Signed)
[facility] Face-to-Face Psychiatry Consult   Reason for Consult: Psychiatric Evaluation Referring Physician: Dr. Vicente Males Patient Identification: Kenneth Jensen MRN:  [id number] Principal Diagnosis: Schizoaffective disorder, bipolar type (HCC) Diagnosis:  Principal Problem:   Schizoaffective disorder, bipolar type (HCC) Active Problems:   Cannabis use disorder, moderate, dependence (HCC)   Tobacco use disorder   Total Time spent with patient: 1 hour  Subjective: "I just need some time here." Kenneth Jensen is a 31 y.o. male patient presented to [facility] ED via POV voluntarily. The patient came in due to him experiencing hallucinations and paranoia. The patient's UDS is remarkable for cocaine and Cannabinoids. The patient is usually not positive for cocaine on past UDS. The patient seems to be getting back to his baseline during his assessment. The patient has an ACT team but cannot provide any current information. The patient is on a monthly injection but is unsure when he last received it.   This provider saw The patient face-to-face; the chart was reviewed, and consulted with Dr. Vicente Males on 11/21/2021 due to the patient's care. It was discussed with the EDP that the patient would remain under observation overnight and be reassessed in the morning due to the patient being positive for cocaine, which is not his drug of choice.   On evaluation, the patient is alert and oriented x 3, irritated but cooperative, and mood-congruent with affect. The patient does appear to be responding to internal and external stimuli. The patient is presenting with some delusional thinking. The patient has a history of dealing with auditory and visual hallucinations. The patient denies any suicidal, homicidal, or self-harm ideations. The patient is presenting with some psychotic and paranoid behaviors.   HPI:    Past Psychiatric History:  Bipolar 1 disorder (HCC) Depression Schizophrenia  (HCC) Schizotaxia  Risk to Self:   Risk to Others:   Prior Inpatient Therapy:   Prior Outpatient Therapy:    Past Medical History:  Past Medical History:  Diagnosis Date   Asthma    Bipolar 1 disorder (HCC)    Depression    Schizophrenia (HCC)    Schizotaxia    History reviewed. No pertinent surgical history. Family History: History reviewed. No pertinent family history. Family Psychiatric  History: History reviewed. No pertinent family psychiatric history Social History:  Social History   Substance and Sexual Activity  Alcohol Use Yes   Comment: social     Social History   Substance and Sexual Activity  Drug Use Yes   Types: Marijuana    Social History   Socioeconomic History   Marital status: Single    Spouse name: Not on file   Number of children: Not on file   Years of education: Not on file   Highest education level: Not on file  Occupational History   Not on file  Tobacco Use   Smoking status: Every Day    Packs/day: 0.50    Types: Cigarettes   Smokeless tobacco: Never  Substance and Sexual Activity   Alcohol use: Yes    Comment: social   Drug use: Yes    Types: Marijuana   Sexual activity: Not Currently  Other Topics Concern   Not on file  Social History Narrative   ** Merged History Encounter **       Social Determinants of Health   Financial Resource Strain: Not on file  Food Insecurity: Not on file  Transportation Needs: Not on file  Physical Activity: Not on file  Stress: Not on file  Social Connections: Not on file   Additional Social History:    Allergies:   Allergies  Allergen Reactions   Amoxicillin Swelling   Haldol [Haloperidol]     Pt states "I went crazy"   Percocet [Oxycodone-Acetaminophen]    Vicodin [Hydrocodone-Acetaminophen] Itching   Vicodin [Hydrocodone-Acetaminophen]     Labs:  Results for orders placed or performed during the hospital encounter of 11/21/21 (from the past 48 hour(s))  Comprehensive  metabolic panel     Status: Abnormal   Collection Time: 11/21/21  3:30 PM  Result Value Ref Range   Sodium 138 135 - 145 mmol/L   Potassium 3.6 3.5 - 5.1 mmol/L   Chloride 102 98 - 111 mmol/L   CO2 24 22 - 32 mmol/L   Glucose, Bld 96 70 - 99 mg/dL    Comment: Glucose reference range applies only to samples taken after fasting for at least 8 hours.   BUN 14 6 - 20 mg/dL   Creatinine, Ser 1.47 0.61 - 1.24 mg/dL   Calcium 9.3 8.9 - 82.9 mg/dL   Total Protein 8.2 (H) 6.5 - 8.1 g/dL   Albumin 4.7 3.5 - 5.0 g/dL   AST 25 15 - 41 U/L   ALT 14 0 - 44 U/L   Alkaline Phosphatase 60 38 - 126 U/L   Total Bilirubin 1.1 0.3 - 1.2 mg/dL   GFR, Estimated >56 >21 mL/min    Comment: (NOTE) Calculated using the CKD-EPI Creatinine Equation (2021)    Anion gap 12 5 - 15    Comment: Performed at [facility], [address] [place], [place] [address]  Ethanol     Status: None   Collection Time: 11/21/21  3:30 PM  Result Value Ref Range   Alcohol, Ethyl (B) <10 <10 mg/dL    Comment: (NOTE) Lowest detectable limit for serum alcohol is 10 mg/dL.  For medical purposes only. Performed at [facility], [address] [place], [place] [address]   Salicylate level     Status: Abnormal   Collection Time: 11/21/21  3:30 PM  Result Value Ref Range   Salicylate Lvl <7.0 (L) 7.0 - 30.0 mg/dL    Comment: Performed at [facility] [address] [place], [place] [address]  Acetaminophen level     Status: Abnormal   Collection Time: 11/21/21  3:30 PM  Result Value Ref Range   Acetaminophen (Tylenol), Serum <10 (L) 10 - 30 ug/mL    Comment: (NOTE) Therapeutic concentrations vary significantly. A range of 10-30 ug/mL  may be an effective concentration for many patients. However, some  are best treated at concentrations outside of this range. Acetaminophen concentrations >150 ug/mL at 4 hours after ingestion  and >50 ug/mL at 12 hours after ingestion are often associated  with  toxic reactions.  Performed at [facility], [address] [place], [place] [address]   cbc     Status: None   Collection Time: 11/21/21  3:30 PM  Result Value Ref Range   WBC 8.4 4.0 - 10.5 K/uL   RBC 4.94 4.22 - 5.81 MIL/uL   Hemoglobin 14.4 13.0 - 17.0 g/dL   HCT 44.0 10.2 - 72.5 %   MCV 85.4 80.0 - 100.0 fL   MCH 29.1 26.0 - 34.0 pg   MCHC 34.1 30.0 - 36.0 g/dL   RDW 36.6 44.0 - 34.7 %   Platelets 333 150 - 400 K/uL   nRBC 0.0 0.0 - 0.2 %  Comment: Performed at [facility], [address] [place], [place] [address]  SARS Coronavirus 2 by RT PCR (hospital order, performed in [facility] hospital lab) *cepheid single result test* Anterior Nasal Swab     Status: None   Collection Time: 11/21/21  3:32 PM   Specimen: Anterior Nasal Swab  Result Value Ref Range   SARS Coronavirus 2 by RT PCR NEGATIVE NEGATIVE    Comment: (NOTE) SARS-CoV-2 target nucleic acids are NOT DETECTED.  The SARS-CoV-2 RNA is generally detectable in upper and lower respiratory specimens during the acute phase of infection. The lowest concentration of SARS-CoV-2 viral copies this assay can detect is 250 copies / mL. A negative result does not preclude SARS-CoV-2 infection and should not be used as the sole basis for treatment or other patient management decisions.  A negative result may occur with improper specimen collection / handling, submission of specimen other than nasopharyngeal swab, presence of viral mutation(s) within the areas targeted by this assay, and inadequate number of viral copies (<250 copies / mL). A negative result must be combined with clinical observations, patient history, and epidemiological information.  Fact Sheet for Patients:   RoadLapTop.co.za  Fact Sheet for Healthcare Providers: http://kim-miller.com/  This test is not yet approved or  cleared by the [place] FDA and has been authorized  for detection and/or diagnosis of SARS-CoV-2 by FDA under an Emergency Use Authorization (EUA).  This EUA will remain in effect (meaning this test can be used) for the duration of the COVID-19 declaration under Section 564(b)(1) of the Act, 21 U.S.C. section 360bbb-3(b)(1), unless the authorization is terminated or revoked sooner.  Performed at [facility], [address], [place], [place] [address]   Urine Drug Screen, Qualitative [facility] only)     Status: Abnormal   Collection Time: 11/21/21  8:11 PM  Result Value Ref Range   Tricyclic, Ur Screen POSITIVE (A) NONE DETECTED   Amphetamines, Ur Screen NONE DETECTED NONE DETECTED   MDMA (Ecstasy)Ur Screen NONE DETECTED NONE DETECTED   Cocaine Metabolite,Ur Odem POSITIVE (A) NONE DETECTED   Opiate, Ur Screen NONE DETECTED NONE DETECTED   Phencyclidine (PCP) Ur S NONE DETECTED NONE DETECTED   Cannabinoid 50 Ng, Ur  POSITIVE (A) NONE DETECTED   Barbiturates, Ur Screen NONE DETECTED NONE DETECTED   Benzodiazepine, Ur Scrn NONE DETECTED NONE DETECTED   Methadone Scn, Ur NONE DETECTED NONE DETECTED    Comment: (NOTE) Tricyclics + metabolites, urine    Cutoff 1000 ng/mL Amphetamines + metabolites, urine  Cutoff 1000 ng/mL MDMA (Ecstasy), urine              Cutoff 500 ng/mL Cocaine Metabolite, urine          Cutoff 300 ng/mL Opiate + metabolites, urine        Cutoff 300 ng/mL Phencyclidine (PCP), urine         Cutoff 25 ng/mL Cannabinoid, urine                 Cutoff 50 ng/mL Barbiturates + metabolites, urine  Cutoff 200 ng/mL Benzodiazepine, urine              Cutoff 200 ng/mL Methadone, urine                   Cutoff 300 ng/mL  The urine drug screen provides only a preliminary, unconfirmed analytical test result and should not be used for non-medical purposes. Clinical consideration and professional judgment should be applied to  any positive drug screen result due to possible interfering substances. A more specific alternate  chemical method must be used in order to obtain a confirmed analytical result. Gas chromatography / mass spectrometry (GC/MS) is the preferred confirm atory method. Performed at [facility], [address] [place], [place] [address]     Current Facility-Administered Medications  Medication Dose Route Frequency Provider Last Rate Last Admin   QUEtiapine (SEROQUEL XR) 24 hr tablet 400 mg  400 mg Oral Daily Merwyn Katos, MD   400 mg at 11/21/21 1604   Current Outpatient Medications  Medication Sig Dispense Refill   divalproex (DEPAKOTE ER) 500 MG 24 hr tablet Take 1 tablet (500 mg total) by mouth 3 (three) times daily. 90 tablet 1   hydrOXYzine (ATARAX) 25 MG tablet Take 1 tablet (25 mg total) by mouth 3 (three) times daily as needed for anxiety. 30 tablet 1   paliperidone (INVEGA SUSTENNA) 234 MG/1.5ML injection Inject 234 mg into the muscle every 28 (twenty-eight) days. 1.8 mL 1   QUEtiapine (SEROQUEL) 200 MG tablet Take 200 mg by mouth at bedtime.     QUEtiapine (SEROQUEL XR) 400 MG 24 hr tablet Take 1 tablet by mouth daily. (Patient not taking: Reported on 11/21/2021)     QUEtiapine (SEROQUEL) 400 MG tablet Take 1 tablet (400 mg total) by mouth at bedtime. (Patient not taking: Reported on 11/21/2021) 30 tablet 1   traZODone (DESYREL) 50 MG tablet Take 1 tablet (50 mg total) by mouth at bedtime as needed for sleep. 30 tablet 0    Musculoskeletal: Strength & Muscle Tone: within normal limits Gait & Station: normal Patient leans: N/A Psychiatric Specialty Exam:  Presentation  General Appearance:  Appropriate for Environment  Eye Contact: Minimal  Speech: Slow; Clear and Coherent  Speech Volume: Normal  Handedness: Right   Mood and Affect  Mood: Hopeless  Affect: Congruent   Thought Process  Thought Processes: Coherent  Descriptions of Associations:Intact  Orientation:Full (Time, Place and Person)  Thought Content:Logical  History of  Schizophrenia/Schizoaffective disorder:Yes  Duration of Psychotic Symptoms:Greater than six months  Hallucinations:Hallucinations: None  Ideas of Reference:None  Suicidal Thoughts:Suicidal Thoughts: No  Homicidal Thoughts:Homicidal Thoughts: No   Sensorium  Memory: Immediate Fair; Recent Fair; Remote Fair  Judgment: Fair  Insight: Fair   Art therapist  Concentration: Fair  Attention Span: Fair  Recall: [employer] of Knowledge: Fair  Language: Fair   Psychomotor Activity  Psychomotor Activity: Psychomotor Activity: Normal   Assets  Assets: Communication Skills; Desire for Improvement; Physical Health; Resilience; Social Support; Housing   Sleep  Sleep: Sleep: Good Number of Hours of Sleep: 8   Physical Exam: Physical Exam Vitals and nursing note reviewed.  Constitutional:      Appearance: Normal appearance. He is normal weight.  HENT:     Head: Normocephalic and atraumatic.     Right Ear: External ear normal.     Left Ear: External ear normal.     Nose: Nose normal.     Mouth/Throat:     Mouth: Mucous membranes are dry.  Cardiovascular:     Rate and Rhythm: Normal rate.     Pulses: Normal pulses.     Heart sounds: Normal heart sounds.  Pulmonary:     Effort: Pulmonary effort is normal.     Breath sounds: Normal breath sounds.  Musculoskeletal:        General: Normal range of motion.     Cervical back: Normal range of motion  and neck supple.  Neurological:     General: No focal deficit present.     Mental Status: He is alert and oriented to person, place, and time.  Psychiatric:        Attention and Perception: Attention and perception normal.        Mood and Affect: Mood is depressed. Affect is blunt and inappropriate.        Speech: Speech is delayed and slurred.        Behavior: Behavior is agitated and slowed. Behavior is cooperative.        Thought Content: Thought content is delusional.        Cognition and Memory:  Memory normal. Cognition is impaired.        Judgment: Judgment is inappropriate.    Review of Systems  Psychiatric/Behavioral:  Positive for depression, hallucinations and substance abuse.    Blood pressure 100/70, pulse 97, temperature 98.1 F (36.7 C), temperature source Oral, resp. rate 18, SpO2 96 %. There is no height or weight on file to calculate BMI.  Treatment Plan Summary: Plan   The patient would remain under observation overnight and be reassessed in the morning.  Disposition: Supportive therapy provided about ongoing stressors. The patient would remain under observation overnight and be reassessed in the morning.  Gillermo MurdochJacqueline Teresia Myint, NP 11/21/2021 10:53 PM

## 2021-11-21 NOTE — ED Notes (Signed)
First nurse note: Pt here to be seen and is making comments that may be potentially threatening to staff, security made aware and called.

## 2021-11-21 NOTE — ED Notes (Signed)
Pt belongings according to charge, Sam, RN:  Black shirt Black pants [employer]

## 2021-11-21 NOTE — ED Notes (Addendum)
Pt Belongings   1 pair of shoes 1 key chain  1 pair of socks 1 pink hat  1 cell phone  1 dog mask 1 phone charger

## 2021-11-21 NOTE — ED Notes (Signed)
Patient heard cursing and responding to internal stimuli loudly. Accepted medication PO. Patient given meal tray.

## 2021-11-21 NOTE — ED Triage Notes (Signed)
Pt here because he may have "the coronavirus". NAD noted.  Pt states he is schizophrenic and has been out of his meds for a month.

## 2021-11-21 NOTE — ED Notes (Signed)
Pt repeatedly yelling for "sandwich and potato chips." Pt provided hospital dinner with cheeseburger and potato chips.

## 2021-11-21 NOTE — ED Provider Notes (Signed)
[facility] Provider Note   Event Date/Time   First MD Initiated Contact with Patient 11/21/21 1528     (approximate) History  COVID TEST  HPI Kenneth Jensen is a 31 y.o. male with a past medical history of schizoaffective disorder who presents requesting testing for coronavirus as he states that he has been around people who tested positive.  Patient is also schizophrenic and has been out of his medications for the past month.  Patient is asking for refills on these medications.  Patient actively responding to internal stimuli during this interview   Physical Exam  Triage Vital Signs: ED Triage Vitals  Enc Vitals Group     BP 11/21/21 1524 130/88     Pulse Rate 11/21/21 1524 96     Resp 11/21/21 1524 19     Temp 11/21/21 1524 98.4 F (36.9 C)     Temp Source 11/21/21 1524 Oral     SpO2 11/21/21 1524 96 %     Weight --      Height --      Head Circumference --      Peak Flow --      Pain Score 11/21/21 1529 0     Pain Loc --      Pain Edu? --      Excl. in GC? --    Most recent vital signs: Vitals:   11/21/21 1524 11/21/21 2016  BP: 130/88 100/70  Pulse: 96 97  Resp: 19 18  Temp: 98.4 F (36.9 C) 98.1 F (36.7 C)  SpO2: 96% 96%   General: Awake, oriented x4. CV:  Good peripheral perfusion.  Resp:  Normal effort.  Abd:  No distention.  Other:  Middle-aged African-American male laying in bed actively responding to internal stimuli ED Results / Procedures / Treatments  Labs (all labs ordered are listed, but only abnormal results are displayed) Labs Reviewed  COMPREHENSIVE METABOLIC PANEL - Abnormal; Notable for the following components:      Result Value   Total Protein 8.2 (*)    All other components within normal limits  SALICYLATE LEVEL - Abnormal; Notable for the following components:   Salicylate Lvl <7.0 (*)    All other components within normal limits  ACETAMINOPHEN LEVEL - Abnormal; Notable for the following components:    Acetaminophen (Tylenol), Serum <10 (*)    All other components within normal limits  URINE DRUG SCREEN, QUALITATIVE ([facility] ONLY) - Abnormal; Notable for the following components:   Tricyclic, Ur Screen POSITIVE (*)    Cocaine Metabolite,Ur [place] POSITIVE (*)    Cannabinoid 50 Ng, Ur [place] POSITIVE (*)    All other components within normal limits  SARS CORONAVIRUS 2 BY RT PCR  ETHANOL  CBC   PROCEDURES: Critical Care performed: No Procedures MEDICATIONS ORDERED IN ED: Medications  QUEtiapine (SEROQUEL XR) 24 hr tablet 400 mg (400 mg Oral Given 11/21/21 1604)  hydrOXYzine (ATARAX) tablet 25 mg (25 mg Oral Given 11/21/21 1604)  divalproex (DEPAKOTE) DR tablet 500 mg (500 mg Oral Given 11/21/21 1604)   IMPRESSION / MDM / ASSESSMENT AND PLAN / ED COURSE  I reviewed the triage vital signs and the nursing notes.                              Patient's presentation is most consistent with acute presentation with potential threat to life or bodily function. Patient presents under IVC for hallucinations/delusions.  Thoughts are disorganized. No history of prior suicide attempt, and no SI or HI at this time. Clinically w/ no overt toxidrome, low suspicion for ingestion given hx and exam Thoughts unlikely 2/2 anemia, hypothyroidism, infection, or ICH. Patients decision making capacity is compromised and they are unable to perform all ADLs (additionally they are without appropriate caretakers to assist through this deficit).  Consult: Psychiatry to evaluate patient for grave disability Disposition: Pending psychiatric evaluation  Care of this patient will be signed out the oncoming physician.  All pertinent patient formation is conveyed and all questions answered.  All further care and disposition decisions will be made by the oncoming physician.   FINAL CLINICAL IMPRESSION(S) / ED DIAGNOSES   Final diagnoses:  Acute psychosis (HCC)  Cocaine intoxication with complication (HCC)   Rx / DC Orders    ED Discharge Orders     None      Note:  This document was prepared using Dragon voice recognition software and may include unintentional dictation errors.   Merwyn Katos, MD 11/21/21 [phone]

## 2021-11-21 NOTE — ED Notes (Signed)
Pt explained "the fat in the burger messes with my belly" and asked for a [place] sandwich. Pt provided [place] sandwich and potato chip tray, along with a fresh cup of ginger ale on ice.

## 2021-11-21 NOTE — ED Notes (Signed)
Patient growing increasingly louder in hallway. Patient moved to room 23.

## 2021-11-21 NOTE — ED Notes (Signed)
Patient laying in bed with eyes closed. Respirations even and unlabored.

## 2021-11-22 ENCOUNTER — Emergency Department
Admission: EM | Admit: 2021-11-22 | Discharge: 2021-11-22 | Disposition: A | Discharge status: Home / Self Care | Payer: Medicare HMO | Source: Home / Self Care | Attending: Emergency Medicine | Admitting: Emergency Medicine

## 2021-11-22 ENCOUNTER — Other Ambulatory Visit: Payer: Self-pay

## 2021-11-22 DIAGNOSIS — R42 Dizziness and giddiness: Secondary | ICD-10-CM

## 2021-11-22 DIAGNOSIS — F23 Brief psychotic disorder: Secondary | ICD-10-CM | POA: Diagnosis not present

## 2021-11-22 DIAGNOSIS — F250 Schizoaffective disorder, bipolar type: Secondary | ICD-10-CM | POA: Diagnosis not present

## 2021-11-22 DIAGNOSIS — F1410 Cocaine abuse, uncomplicated: Secondary | ICD-10-CM | POA: Diagnosis present

## 2021-11-22 LAB — CBC
HCT: 41.1 % (ref 39.0–52.0)
Hemoglobin: 13.9 g/dL (ref 13.0–17.0)
MCH: 29.6 pg (ref 26.0–34.0)
MCHC: 33.8 g/dL (ref 30.0–36.0)
MCV: 87.4 fL (ref 80.0–100.0)
Platelets: 316 10*3/uL (ref 150–400)
RBC: 4.7 MIL/uL (ref 4.22–5.81)
RDW: 12.8 % (ref 11.5–15.5)
WBC: 7.8 10*3/uL (ref 4.0–10.5)
nRBC: 0 % (ref 0.0–0.2)

## 2021-11-22 LAB — COMPREHENSIVE METABOLIC PANEL
ALT: 13 U/L (ref 0–44)
AST: 22 U/L (ref 15–41)
Albumin: 4.1 g/dL (ref 3.5–5.0)
Alkaline Phosphatase: 57 U/L (ref 38–126)
Anion gap: 11 (ref 5–15)
BUN: 15 mg/dL (ref 6–20)
CO2: 24 mmol/L (ref 22–32)
Calcium: 8.7 mg/dL — ABNORMAL LOW (ref 8.9–10.3)
Chloride: 102 mmol/L (ref 98–111)
Creatinine, Ser: 0.89 mg/dL (ref 0.61–1.24)
GFR, Estimated: >60 mL/min (ref >60)
Glucose, Bld: 116 mg/dL — ABNORMAL HIGH (ref 70–99)
Potassium: 3.7 mmol/L (ref 3.5–5.1)
Sodium: 137 mmol/L (ref 135–145)
Total Bilirubin: 0.5 mg/dL (ref 0.3–1.2)
Total Protein: 7.5 g/dL (ref 6.5–8.1)

## 2021-11-22 LAB — ACETAMINOPHEN LEVEL: Acetaminophen (Tylenol), Serum: <10 ug/mL — ABNORMAL LOW (ref 10–30)

## 2021-11-22 LAB — ETHANOL: Alcohol, Ethyl (B): <10 mg/dL (ref <10)

## 2021-11-22 LAB — SALICYLATE LEVEL: Salicylate Lvl: <7 mg/dL — ABNORMAL LOW (ref 7.0–30.0)

## 2021-11-22 NOTE — ED Notes (Signed)
Reports smoking blunt and then feeling dizzy. Called 911, but then changed mind. EMS arrived to house and friend encouraged him to come here. Pt states he just needs to be watched. Requesting food.

## 2021-11-22 NOTE — ED Notes (Signed)
Asking for his morning meds, breakfast ordered and pending arrival

## 2021-11-22 NOTE — ED Notes (Signed)
Pt received all his belongings from previous RN.

## 2021-11-22 NOTE — ED Provider Notes (Signed)
   [facility] Provider Note    Event Date/Time   First MD Initiated Contact with Patient 11/22/21 1306     (approximate)   History   Dizziness   HPI  Kenneth Jensen is a 31 y.o. male who was discharged and comes back feeling dizzy.  He told EMS he passed out.  He said he smoked a blunt and that made him feel bad.  He tells me that he smelled the marijuana before his friend rolled it and it was good weed.  He is sitting up and eating right now.  He says he is under a lot of stress at home and just needs to sit for little bit and eat and he will try to go home again.     Physical Exam   Triage Vital Signs: ED Triage Vitals  Enc Vitals Group     BP 11/22/21 1306 103/76     Pulse Rate 11/22/21 1306 77     Resp 11/22/21 1306 18     Temp 11/22/21 1306 98 F (36.7 C)     Temp src --      SpO2 11/22/21 1306 99 %     Weight --      Height --      Head Circumference --      Peak Flow --      Pain Score 11/22/21 1302 0     Pain Loc --      Pain Edu? --      Excl. in GC? --     Most recent vital signs: Vitals:   11/22/21 1306  BP: 103/76  Pulse: 77  Resp: 18  Temp: 98 F (36.7 C)  SpO2: 99%     General: Awake, no distress.  CV:  Good peripheral perfusion.  Heart regular rate and rhythm no audible murmurs Resp:  Normal effort.  Lungs are clear Abd:  No distention.     ED Results / Procedures / Treatments   Labs (all labs ordered are listed, but only abnormal results are displayed) Labs Reviewed  COMPREHENSIVE METABOLIC PANEL  ETHANOL  SALICYLATE LEVEL  ACETAMINOPHEN LEVEL  CBC  URINE DRUG SCREEN, QUALITATIVE ([facility] ONLY)     EKG   RADIOLOGY    PROCEDURES:  Critical Care performed:   Procedures   MEDICATIONS ORDERED IN ED: Medications - No data to display   IMPRESSION / MDM / ASSESSMENT AND PLAN / ED COURSE  I reviewed the triage vital signs and the nursing notes.   Differential diagnosis includes, but is  not limited to, accidental overdose, drug reaction, reaction to unanticipated ingestion of unknown substance  Patient's presentation is most consistent with acute complicated illness / injury requiring diagnostic workup.     FINAL CLINICAL IMPRESSION(S) / ED DIAGNOSES   Final diagnoses:  Dizziness     Rx / DC Orders   ED Discharge Orders     None        Note:  This document was prepared using Dragon voice recognition software and may include unintentional dictation errors.   Arnaldo Natal, MD 11/22/21 1332

## 2021-11-22 NOTE — ED Triage Notes (Signed)
Reports smoking marijuana and c/o dizziness. Denies syncope. BP 94/58 hr 75 per EMS.

## 2021-11-22 NOTE — ED Notes (Signed)
Called ACT team with [facility] for ride home. Provided discharge papers.

## 2021-11-22 NOTE — ED Notes (Addendum)
Hospital meal provided, pt tolerated w/o complaints.  Waste discarded appropriately.  

## 2021-11-22 NOTE — ED Triage Notes (Signed)
Pt comes via EMS from home with c/o loc. Pt states he passed out 4 times the patient was just dc from [facility] earlier. Pt states he did smoke a blunt and just needs to be watched.   Pt talking about getting a ride, his grandpa being a highway patrol. Pt states he is pissed. Pt denies hearing any voices. Pt states his money isn't going right. Pt states he dont care about funky buildings. Pt rambling and some unable to understand.   Pt denies any SI or HI.  Pt states he is going to put his hands on his knees so he doesn't get anxious.

## 2021-11-22 NOTE — ED Notes (Signed)
VOL/pending reassessment in the AM 

## 2021-11-22 NOTE — Discharge Instructions (Addendum)
Continue your medications.  Please try to avoid any illicit substances.  Follow-up with the act team and [facility] as needed.

## 2021-11-22 NOTE — BH Assessment (Signed)
Writer spoke with the patient to complete an updated/reassessment. Patient denies SI/HI and AV/H. He wasn't responding to internal stimuli. He was clear and coherent. Patient will follow up with his ACT Team, with [facility].

## 2021-11-22 NOTE — ED Notes (Signed)
Requesting to leave. States he feels better after eating. Using phone to call ride.

## 2021-11-22 NOTE — ED Notes (Signed)
Pt requesting shower and was provided with clothing and shower supplies

## 2021-11-22 NOTE — Discharge Instructions (Signed)
Try to eat regular meals.  Be careful about stuff you get from other people.  Do not hesitate to return if you need to.

## 2021-11-22 NOTE — ED Provider Notes (Addendum)
Emergency Medicine Observation Re-evaluation Note  Kenneth Jensen is a 31 y.o. male, seen on rounds today.    Physical Exam  BP 100/70 (BP Location: Left Arm)   Pulse 97   Temp 98.1 F (36.7 C) (Oral)   Resp 18   SpO2 96%  Physical Exam General: Patient resting comfortably in bed Lungs: Patient not in respiratory distress Psych: Patient not combative  ED Course / MDM  EKG:     Plan  Current plan is for psychiatric evaluation and treatment.    Arnaldo Natal, MD 11/22/21 [phone] Patient now cleared by psych.  He has act team and [facility] for follow-up he has a place to go.  We will let him go.   Arnaldo Natal, MD 11/22/21 1014

## 2021-11-22 NOTE — Consult Note (Signed)
[facility] Face-to-Face Psychiatry Consult   Reason for Consult: Psychiatric Evaluation Referring Physician: Dr. Vicente Males Patient Identification: Kenneth Jensen MRN:  [id number] Principal Diagnosis: Schizoaffective disorder, bipolar type (HCC) Diagnosis:  Principal Problem:   Schizoaffective disorder, bipolar type (HCC) Active Problems:   Cocaine abuse (HCC)   Cannabis use disorder, moderate, dependence (HCC)   Tobacco use disorder   Total Time spent with patient: 30 minutes  Subjective: "I feel better today."  The client was concerned that he had the Coronavirus.  The team let him know that he did not.  He denies suicidal/homicidal ideations and psychosis, no delusions noted.  No cocaine withdrawal symptoms noted.  His ACT is [facility] and his last Invega shot was 11/9, psychiatrically cleared and requested to return to his apartment.  HPI per Kenneth Jensen: Kenneth Jensen is a 31 y.o. male patient presented to [facility] ED via POV voluntarily. The patient came in due to him experiencing hallucinations and paranoia. The patient's UDS is remarkable for cocaine and Cannabinoids. The patient is usually not positive for cocaine on past UDS. The patient seems to be getting back to his baseline during his assessment. The patient has an ACT team but cannot provide any current information. The patient is on a monthly injection but is unsure when he last received it.   This provider saw The patient face-to-face; the chart was reviewed, and consulted with Dr. Vicente Males on 11/21/2021 due to the patient's care. It was discussed with the EDP that the patient would remain under observation overnight and be reassessed in the morning due to the patient being positive for cocaine, which is not his drug of choice.   On evaluation, the patient is alert and oriented x 3, irritated but cooperative, and mood-congruent with affect. The patient does appear to be responding to internal and external stimuli. The  patient is presenting with some delusional thinking. The patient has a history of dealing with auditory and visual hallucinations. The patient denies any suicidal, homicidal, or self-harm ideations. The patient is presenting with some psychotic and paranoid behaviors.   Past Psychiatric History:  Bipolar 1 disorder (HCC) Depression Schizophrenia (HCC) Schizotaxia  Risk to Self:  none Risk to Others:  none Prior Inpatient Therapy:  multiple times Prior Outpatient Therapy:  [facility] ACT team  Past Medical History:  Past Medical History:  Diagnosis Date   Asthma    Bipolar 1 disorder (HCC)    Depression    Schizophrenia (HCC)    Schizotaxia    History reviewed. No pertinent surgical history. Family History: History reviewed. No pertinent family history. Family Psychiatric  History: History reviewed. No pertinent family psychiatric history Social History:  Social History   Substance and Sexual Activity  Alcohol Use Yes   Comment: social     Social History   Substance and Sexual Activity  Drug Use Yes   Types: Marijuana    Social History   Socioeconomic History   Marital status: Single    Spouse name: Not on file   Number of children: Not on file   Years of education: Not on file   Highest education level: Not on file  Occupational History   Not on file  Tobacco Use   Smoking status: Every Day    Packs/day: 0.50    Types: Cigarettes   Smokeless tobacco: Never  Substance and Sexual Activity   Alcohol use: Yes    Comment: social   Drug use: Yes    Types: Marijuana   Sexual  activity: Not Currently  Other Topics Concern   Not on file  Social History Narrative   ** Merged History Encounter **       Social Determinants of Health   Financial Resource Strain: Not on file  Food Insecurity: Not on file  Transportation Needs: Not on file  Physical Activity: Not on file  Stress: Not on file  Social Connections: Not on file   Additional Social History:     Allergies:   Allergies  Allergen Reactions   Amoxicillin Swelling   Haldol [Haloperidol]     Pt states "I went crazy"   Percocet [Oxycodone-Acetaminophen]    Vicodin [Hydrocodone-Acetaminophen] Itching   Vicodin [Hydrocodone-Acetaminophen]     Labs:  Results for orders placed or performed during the hospital encounter of 11/21/21 (from the past 48 hour(s))  Comprehensive metabolic panel     Status: Abnormal   Collection Time: 11/21/21  3:30 PM  Result Value Ref Range   Sodium 138 135 - 145 mmol/L   Potassium 3.6 3.5 - 5.1 mmol/L   Chloride 102 98 - 111 mmol/L   CO2 24 22 - 32 mmol/L   Glucose, Bld 96 70 - 99 mg/dL    Comment: Glucose reference range applies only to samples taken after fasting for at least 8 hours.   BUN 14 6 - 20 mg/dL   Creatinine, Ser 1.38 0.61 - 1.24 mg/dL   Calcium 9.3 8.9 - 87.1 mg/dL   Total Protein 8.2 (H) 6.5 - 8.1 g/dL   Albumin 4.7 3.5 - 5.0 g/dL   AST 25 15 - 41 U/L   ALT 14 0 - 44 U/L   Alkaline Phosphatase 60 38 - 126 U/L   Total Bilirubin 1.1 0.3 - 1.2 mg/dL   GFR, Estimated >95 >97 mL/min    Comment: (NOTE) Calculated using the CKD-EPI Creatinine Equation (2021)    Anion gap 12 5 - 15    Comment: Performed at [facility], [address] [place], [place] [address]  Ethanol     Status: None   Collection Time: 11/21/21  3:30 PM  Result Value Ref Range   Alcohol, Ethyl (B) <10 <10 mg/dL    Comment: (NOTE) Lowest detectable limit for serum alcohol is 10 mg/dL.  For medical purposes only. Performed at [facility], [address] [place], [place] [address]   Salicylate level     Status: Abnormal   Collection Time: 11/21/21  3:30 PM  Result Value Ref Range   Salicylate Lvl <7.0 (L) 7.0 - 30.0 mg/dL    Comment: Performed at [facility], [address] [place], [place] [address]  Acetaminophen level     Status: Abnormal   Collection Time: 11/21/21  3:30 PM  Result Value Ref Range   Acetaminophen  (Tylenol), Serum <10 (L) 10 - 30 ug/mL    Comment: (NOTE) Therapeutic concentrations vary significantly. A range of 10-30 ug/mL  may be an effective concentration for many patients. However, some  are best treated at concentrations outside of this range. Acetaminophen concentrations >150 ug/mL at 4 hours after ingestion  and >50 ug/mL at 12 hours after ingestion are often associated with  toxic reactions.  Performed at [facility], [address] [place], [place] [address]   cbc     Status: None   Collection Time: 11/21/21  3:30 PM  Result Value Ref Range   WBC 8.4 4.0 - 10.5 K/uL   RBC 4.94 4.22 - 5.81 MIL/uL  Hemoglobin 14.4 13.0 - 17.0 g/dL   HCT 16.1 09.6 - 04.5 %   MCV 85.4 80.0 - 100.0 fL   MCH 29.1 26.0 - 34.0 pg   MCHC 34.1 30.0 - 36.0 g/dL   RDW 40.9 81.1 - 91.4 %   Platelets 333 150 - 400 K/uL   nRBC 0.0 0.0 - 0.2 %    Comment: Performed at [facility], [address] [place], [place] [address]  SARS Coronavirus 2 by RT PCR (hospital order, performed in [facility] hospital lab) *cepheid single result test* Anterior Nasal Swab     Status: None   Collection Time: 11/21/21  3:32 PM   Specimen: Anterior Nasal Swab  Result Value Ref Range   SARS Coronavirus 2 by RT PCR NEGATIVE NEGATIVE    Comment: (NOTE) SARS-CoV-2 target nucleic acids are NOT DETECTED.  The SARS-CoV-2 RNA is generally detectable in upper and lower respiratory specimens during the acute phase of infection. The lowest concentration of SARS-CoV-2 viral copies this assay can detect is 250 copies / mL. A negative result does not preclude SARS-CoV-2 infection and should not be used as the sole basis for treatment or other patient management decisions.  A negative result may occur with improper specimen collection / handling, submission of specimen other than nasopharyngeal swab, presence of viral mutation(s) within the areas targeted by this assay, and inadequate number of viral  copies (<250 copies / mL). A negative result must be combined with clinical observations, patient history, and epidemiological information.  Fact Sheet for Patients:   RoadLapTop.co.za  Fact Sheet for Healthcare Providers: http://kim-miller.com/  This test is not yet approved or  cleared by the [place] FDA and has been authorized for detection and/or diagnosis of SARS-CoV-2 by FDA under an Emergency Use Authorization (EUA).  This EUA will remain in effect (meaning this test can be used) for the duration of the COVID-19 declaration under Section 564(b)(1) of the Act, 21 U.S.C. section 360bbb-3(b)(1), unless the authorization is terminated or revoked sooner.  Performed at [facility], [address], [place], [place] [address]   Urine Drug Screen, Qualitative [facility] only)     Status: Abnormal   Collection Time: 11/21/21  8:11 PM  Result Value Ref Range   Tricyclic, Ur Screen POSITIVE (A) NONE DETECTED   Amphetamines, Ur Screen NONE DETECTED NONE DETECTED   MDMA (Ecstasy)Ur Screen NONE DETECTED NONE DETECTED   Cocaine Metabolite,Ur Kake POSITIVE (A) NONE DETECTED   Opiate, Ur Screen NONE DETECTED NONE DETECTED   Phencyclidine (PCP) Ur S NONE DETECTED NONE DETECTED   Cannabinoid 50 Ng, Ur Milo POSITIVE (A) NONE DETECTED   Barbiturates, Ur Screen NONE DETECTED NONE DETECTED   Benzodiazepine, Ur Scrn NONE DETECTED NONE DETECTED   Methadone Scn, Ur NONE DETECTED NONE DETECTED    Comment: (NOTE) Tricyclics + metabolites, urine    Cutoff 1000 ng/mL Amphetamines + metabolites, urine  Cutoff 1000 ng/mL MDMA (Ecstasy), urine              Cutoff 500 ng/mL Cocaine Metabolite, urine          Cutoff 300 ng/mL Opiate + metabolites, urine        Cutoff 300 ng/mL Phencyclidine (PCP), urine         Cutoff 25 ng/mL Cannabinoid, urine                 Cutoff 50 ng/mL Barbiturates + metabolites, urine  Cutoff 200 ng/mL Benzodiazepine, urine  Cutoff 200 ng/mL Methadone, urine                   Cutoff 300 ng/mL  The urine drug screen provides only a preliminary, unconfirmed analytical test result and should not be used for non-medical purposes. Clinical consideration and professional judgment should be applied to any positive drug screen result due to possible interfering substances. A more specific alternate chemical method must be used in order to obtain a confirmed analytical result. Gas chromatography / mass spectrometry (GC/MS) is the preferred confirm atory method. Performed at [facility], [address] [place], [place] [id number]     Current Facility-Administered Medications  Medication Dose Route Frequency Provider Last Rate Last Admin   QUEtiapine (SEROQUEL XR) 24 hr tablet 400 mg  400 mg Oral Daily Merwyn KatosBradler, Evan K, MD   400 mg at 11/22/21 [id number]   Current Outpatient Medications  Medication Sig Dispense Refill   divalproex (DEPAKOTE ER) 500 MG 24 hr tablet Take 1 tablet (500 mg total) by mouth 3 (three) times daily. 90 tablet 1   hydrOXYzine (ATARAX) 25 MG tablet Take 1 tablet (25 mg total) by mouth 3 (three) times daily as needed for anxiety. 30 tablet 1   paliperidone (INVEGA SUSTENNA) 234 MG/1.5ML injection Inject 234 mg into the muscle every 28 (twenty-eight) days. 1.8 mL 1   QUEtiapine (SEROQUEL) 200 MG tablet Take 200 mg by mouth at bedtime.     QUEtiapine (SEROQUEL XR) 400 MG 24 hr tablet Take 1 tablet by mouth daily. (Patient not taking: Reported on 11/21/2021)     QUEtiapine (SEROQUEL) 400 MG tablet Take 1 tablet (400 mg total) by mouth at bedtime. (Patient not taking: Reported on 11/21/2021) 30 tablet 1   traZODone (DESYREL) 50 MG tablet Take 1 tablet (50 mg total) by mouth at bedtime as needed for sleep. 30 tablet 0    Musculoskeletal: Strength & Muscle Tone: within normal limits Gait & Station: normal Patient leans: N/A  Psychiatric Specialty Exam: Physical Exam Vitals and  nursing note reviewed.  Constitutional:      Appearance: Normal appearance.  HENT:     Head: Normocephalic.     Nose: Nose normal.  Pulmonary:     Effort: Pulmonary effort is normal.  Musculoskeletal:        General: Normal range of motion.     Cervical back: Normal range of motion.  Neurological:     General: No focal deficit present.     Mental Status: He is alert and oriented to person, place, and time.  Psychiatric:        Attention and Perception: Attention and perception normal.        Mood and Affect: Affect is blunt.        Speech: Speech normal.        Behavior: Behavior normal. Behavior is cooperative.        Thought Content: Thought content normal.        Cognition and Memory: Cognition and memory normal.        Judgment: Judgment normal.     Review of Systems  Psychiatric/Behavioral:  Positive for substance abuse.   All other systems reviewed and are negative.   Blood pressure (!) 106/55, pulse 96, temperature 98.3 F (36.8 C), temperature source Oral, resp. rate 17, SpO2 100 %.There is no height or weight on file to calculate BMI.  General Appearance: Casual  Eye Contact:  Good  Speech:  Normal Rate  Volume:  Normal  Mood:  Euthymic  Affect:  Blunt  Thought Process:  Coherent  Orientation:  Full (Time, Place, and Person)  Thought Content:  Logical  Suicidal Thoughts:  No  Homicidal Thoughts:  No  Memory:  Immediate;   Good Recent;   Good Remote;   Good  Judgement:  Fair  Insight:  Fair  Psychomotor Activity:  Normal  Concentration:  Concentration: Good and Attention Span: Good  Recall:  Good  Fund of Knowledge:  Good  Language:  Good  Akathisia:  No  Handed:  Right  AIMS (if indicated):     Assets:  Housing Leisure Time Physical Health Resilience Social Support  ADL's:  Intact  Cognition:  WNL  Sleep:         Physical Exam: Physical Exam Vitals and nursing note reviewed.  Constitutional:      Appearance: Normal appearance.  HENT:      Head: Normocephalic.     Nose: Nose normal.  Pulmonary:     Effort: Pulmonary effort is normal.  Musculoskeletal:        General: Normal range of motion.     Cervical back: Normal range of motion.  Neurological:     General: No focal deficit present.     Mental Status: He is alert and oriented to person, place, and time.  Psychiatric:        Attention and Perception: Attention and perception normal.        Mood and Affect: Affect is blunt.        Speech: Speech normal.        Behavior: Behavior normal. Behavior is cooperative.        Thought Content: Thought content normal.        Cognition and Memory: Cognition and memory normal.        Judgment: Judgment normal.    Review of Systems  Psychiatric/Behavioral:  Positive for substance abuse.   All other systems reviewed and are negative.  Blood pressure (!) 106/55, pulse 96, temperature 98.3 F (36.8 C), temperature source Oral, resp. rate 17, SpO2 100 %. There is no height or weight on file to calculate BMI.  Treatment Plan Summary: Schizoaffective disorder bipolar type: Follow up with ACT team  Disposition: Discharge  Nanine Means, NP 11/22/2021 9:59 AM

## 2021-12-27 ENCOUNTER — Emergency Department
Admission: EM | Admit: 2021-12-27 | Discharge: 2021-12-27 | Disposition: A | Discharge status: Home / Self Care | Payer: Medicare HMO | Attending: Emergency Medicine | Admitting: Emergency Medicine

## 2021-12-27 DIAGNOSIS — Z1152 Encounter for screening for COVID-19: Secondary | ICD-10-CM | POA: Diagnosis not present

## 2021-12-27 DIAGNOSIS — R112 Nausea with vomiting, unspecified: Secondary | ICD-10-CM | POA: Diagnosis not present

## 2021-12-27 DIAGNOSIS — R1110 Vomiting, unspecified: Secondary | ICD-10-CM | POA: Insufficient documentation

## 2021-12-27 DIAGNOSIS — K30 Functional dyspepsia: Secondary | ICD-10-CM | POA: Diagnosis not present

## 2021-12-27 DIAGNOSIS — R109 Unspecified abdominal pain: Secondary | ICD-10-CM | POA: Insufficient documentation

## 2021-12-27 LAB — RESP PANEL BY RT-PCR (RSV, FLU A&B, COVID)  RVPGX2
Influenza A by PCR: NEGATIVE
Influenza B by PCR: NEGATIVE
Resp Syncytial Virus by PCR: NEGATIVE
SARS Coronavirus 2 by RT PCR: NEGATIVE

## 2021-12-27 MED ORDER — ONDANSETRON 4 MG PO TBDP
4.0000 mg | ORAL_TABLET | Freq: Once | ORAL | Status: AC
  Administered 2021-12-27: 4 mg via ORAL
  Filled 2021-12-27: qty 1

## 2021-12-27 NOTE — ED Triage Notes (Signed)
Pt presents to the ED via POV due to "not feeling right". Pt has multiple complaints. Pt states he has not been feeling well last couple of days and vomitted at a friends house. Pt states "I need to stay here for a night, just one night". Pt also complains of foot pain. Pt states "every time I come here, I tell them my foor hurt and they put me in the psych ward". Pt c/o "hard to doo doo". Pt A&Ox4

## 2021-12-27 NOTE — ED Provider Notes (Signed)
[facility] Provider Note    Event Date/Time   First MD Initiated Contact with Patient 12/27/21 1532     (approximate)   History   Emesis   HPI  Kenneth Jensen is a 31 y.o. male  who presents to the emergency department today because of concerns for an upset stomach.  He says that his stomach has been upset over the past few days.  He has had some vomiting and loose stools.  Has had associated diffuse abdominal discomfort.  This does come and go.  He denies any fevers.  Denies any known sick contacts or unusual ingestions.     Physical Exam   Triage Vital Signs: ED Triage Vitals  Enc Vitals Group     BP 12/27/21 1440 110/74     Pulse Rate 12/27/21 1440 67     Resp 12/27/21 1440 16     Temp 12/27/21 1440 97.9 F (36.6 C)     Temp Source 12/27/21 1440 Oral     SpO2 12/27/21 1440 95 %     Weight --      Height --      Head Circumference --      Peak Flow --      Pain Score 12/27/21 1441 4     Pain Loc --      Pain Edu? --      Excl. in [place]? --     Most recent vital signs: Vitals:   12/27/21 1440  BP: 110/74  Pulse: 67  Resp: 16  Temp: 97.9 F (36.6 C)  SpO2: 95%   General: Awake, alert, oriented. CV:  Good peripheral perfusion. Regular rate and rhythm. Resp:  Normal effort. Lungs clear. Abd:  No distention.    ED Results / Procedures / Treatments   Labs (all labs ordered are listed, but only abnormal results are displayed) Labs Reviewed  RESP PANEL BY RT-PCR (RSV, FLU A&B, COVID)  RVPGX2  LIPASE, BLOOD  COMPREHENSIVE METABOLIC PANEL  CBC  URINALYSIS, ROUTINE W REFLEX MICROSCOPIC     EKG  None   RADIOLOGY None   PROCEDURES:  Critical Care performed: No  Procedures   MEDICATIONS ORDERED IN ED: Medications  ondansetron (ZOFRAN-ODT) disintegrating tablet 4 mg (has no administration in time range)     IMPRESSION / MDM / ASSESSMENT AND PLAN / ED COURSE  I reviewed the triage vital signs and the nursing  notes.                              Differential diagnosis includes, but is not limited to, pancreatitis, hepatitis, gallbladder disease, COVID, gastroenteritis.   Patient's presentation is most consistent with acute presentation with potential threat to life or bodily function.  Patient presented to the emergency department today because of concerns for nausea, vomiting, loose stools and abdominal pain.  Exam patient's abdomen is nontender.  Afebrile.  While waiting for blood test patient stated that his stomach felt better.  He did request discharge.  I did discuss with patient that we did not have his blood test results back.  He again stated that he felt better and would like to be discharged.  At this time given that he is feeling better he was afebrile and his abdomen was nontender I have low suspicion for significant intra-abdominal infection.  Will give patient primary care information.     FINAL CLINICAL IMPRESSION(S) / ED DIAGNOSES  Final diagnoses:  Upset stomach     Note:  This document was prepared using Dragon voice recognition software and may include unintentional dictation errors.    [name], MD 12/27/21 1700

## 2021-12-27 NOTE — Discharge Instructions (Signed)
Please go to the following website to schedule new (and existing) patient appointments:   [email or web address]   The following is a list of primary care offices in the area who are accepting new patients at this time.  Please reach out to one of them directly and let them know you would like to schedule an appointment to follow up on an Emergency Department visit, and/or to establish a new primary care provider (PCP).  There are likely other primary care clinics in the are who are accepting new patients, but this is an excellent place to start:  [facility] Lead physician: Dr Angela Bacigalupo [address] [place], [place] [address] [phone]  [facility] Lead Physician: Dr Krichna Sowles [address], [place], [place] [address] [phone]  [facility]  Lead Physician: Dr Megan Johnson [address], [place], [place] [address] [phone]  [facility] [place] [facility] Lead Physician: Dr Alex Karamalegos [address], [place], [place] [address] [phone]  [facility] [place] Lead Physician: Dr Laura Berglund [address], [place],  [address] [phone]   

## 2022-02-01 ENCOUNTER — Other Ambulatory Visit: Payer: Self-pay

## 2022-02-01 ENCOUNTER — Emergency Department
Admission: EM | Admit: 2022-02-01 | Discharge: 2022-02-02 | Disposition: A | Discharge status: Home / Self Care | Payer: Medicare HMO | Attending: Emergency Medicine | Admitting: Emergency Medicine

## 2022-02-01 DIAGNOSIS — F250 Schizoaffective disorder, bipolar type: Secondary | ICD-10-CM | POA: Insufficient documentation

## 2022-02-01 DIAGNOSIS — Z20822 Contact with and (suspected) exposure to covid-19: Secondary | ICD-10-CM | POA: Diagnosis not present

## 2022-02-01 DIAGNOSIS — F17210 Nicotine dependence, cigarettes, uncomplicated: Secondary | ICD-10-CM | POA: Insufficient documentation

## 2022-02-01 DIAGNOSIS — J45909 Unspecified asthma, uncomplicated: Secondary | ICD-10-CM | POA: Diagnosis not present

## 2022-02-01 DIAGNOSIS — F209 Schizophrenia, unspecified: Secondary | ICD-10-CM | POA: Diagnosis not present

## 2022-02-01 DIAGNOSIS — R440 Auditory hallucinations: Secondary | ICD-10-CM

## 2022-02-01 LAB — URINE DRUG SCREEN, QUALITATIVE (ARMC ONLY)
Amphetamines, Ur Screen: NOT DETECTED
Barbiturates, Ur Screen: NOT DETECTED
Benzodiazepine, Ur Scrn: NOT DETECTED
Cannabinoid 50 Ng, Ur ~~LOC~~: POSITIVE — AB
Cocaine Metabolite,Ur ~~LOC~~: NOT DETECTED
MDMA (Ecstasy)Ur Screen: NOT DETECTED
Methadone Scn, Ur: NOT DETECTED
Opiate, Ur Screen: NOT DETECTED
Phencyclidine (PCP) Ur S: NOT DETECTED
Tricyclic, Ur Screen: POSITIVE — AB

## 2022-02-01 LAB — RESP PANEL BY RT-PCR (RSV, FLU A&B, COVID)  RVPGX2
Influenza A by PCR: NEGATIVE
Influenza B by PCR: NEGATIVE
Resp Syncytial Virus by PCR: NEGATIVE
SARS Coronavirus 2 by RT PCR: NEGATIVE

## 2022-02-01 LAB — COMPREHENSIVE METABOLIC PANEL
ALT: 10 U/L (ref 0–44)
AST: 18 U/L (ref 15–41)
Albumin: 3.9 g/dL (ref 3.5–5.0)
Alkaline Phosphatase: 62 U/L (ref 38–126)
Anion gap: 11 (ref 5–15)
BUN: 12 mg/dL (ref 6–20)
CO2: 23 mmol/L (ref 22–32)
Calcium: 8.4 mg/dL — ABNORMAL LOW (ref 8.9–10.3)
Chloride: 101 mmol/L (ref 98–111)
Creatinine, Ser: 0.84 mg/dL (ref 0.61–1.24)
GFR, Estimated: >60 mL/min (ref >60)
Glucose, Bld: 91 mg/dL (ref 70–99)
Potassium: 3.7 mmol/L (ref 3.5–5.1)
Sodium: 135 mmol/L (ref 135–145)
Total Bilirubin: 0.6 mg/dL (ref 0.3–1.2)
Total Protein: 7.1 g/dL (ref 6.5–8.1)

## 2022-02-01 LAB — CBC
HCT: 39.4 % (ref 39.0–52.0)
Hemoglobin: 13.4 g/dL (ref 13.0–17.0)
MCH: 29.5 pg (ref 26.0–34.0)
MCHC: 34 g/dL (ref 30.0–36.0)
MCV: 86.6 fL (ref 80.0–100.0)
Platelets: 363 10*3/uL (ref 150–400)
RBC: 4.55 MIL/uL (ref 4.22–5.81)
RDW: 12.7 % (ref 11.5–15.5)
WBC: 9 10*3/uL (ref 4.0–10.5)
nRBC: 0 % (ref 0.0–0.2)

## 2022-02-01 LAB — ETHANOL: Alcohol, Ethyl (B): <10 mg/dL (ref <10)

## 2022-02-01 LAB — ACETAMINOPHEN LEVEL: Acetaminophen (Tylenol), Serum: <10 ug/mL — ABNORMAL LOW (ref 10–30)

## 2022-02-01 LAB — SALICYLATE LEVEL: Salicylate Lvl: <7 mg/dL — ABNORMAL LOW (ref 7.0–30.0)

## 2022-02-01 NOTE — ED Provider Notes (Signed)
[facility] Provider Note    Event Date/Time   First MD Initiated Contact with Patient 02/01/22 2306     (approximate)   History   Psychiatric Evaluation   HPI  Kenneth Jensen is a 32 y.o. male  who presents to the emergency department today because of concern for auditory hallucinations. Patient states that it is the voice of his mother that he is hearing. At times she is telling him things that he cannot repeat. The patient denies any medical complaints. Per chart review patient does have history of schizophrenia.      Physical Exam   Triage Vital Signs: ED Triage Vitals  Enc Vitals Group     BP 02/01/22 2204 131/85     Pulse Rate 02/01/22 2204 75     Resp 02/01/22 2204 18     Temp 02/01/22 2204 98.2 F (36.8 C)     Temp Source 02/01/22 2204 Oral     SpO2 02/01/22 2204 94 %     Weight 02/01/22 2206 198 lb (89.8 kg)     Height --      Head Circumference --      Peak Flow --      Pain Score 02/01/22 2206 3     Pain Loc --      Pain Edu? --      Excl. in [place]? --     Most recent vital signs: Vitals:   02/01/22 2204  BP: 131/85  Pulse: 75  Resp: 18  Temp: 98.2 F (36.8 C)  SpO2: 94%   General: Awake, alert, oriented. CV:  Good peripheral perfusion. Regular rate and rhythm. Resp:  Normal effort. Lungs clear. Abd:  No distention.  Other:  Does not appear to be responding to internal stimuli.   ED Results / Procedures / Treatments   Labs (all labs ordered are listed, but only abnormal results are displayed) Labs Reviewed  COMPREHENSIVE METABOLIC PANEL - Abnormal; Notable for the following components:      Result Value   Calcium 8.4 (*)    All other components within normal limits  SALICYLATE LEVEL - Abnormal; Notable for the following components:   Salicylate Lvl <2.4 (*)    All other components within normal limits  ACETAMINOPHEN LEVEL - Abnormal; Notable for the following components:   Acetaminophen (Tylenol), Serum <10  (*)    All other components within normal limits  URINE DRUG SCREEN, QUALITATIVE ([facility] ONLY) - Abnormal; Notable for the following components:   Tricyclic, Ur Screen POSITIVE (*)    Cannabinoid 50 Ng, Ur Export POSITIVE (*)    All other components within normal limits  RESP PANEL BY RT-PCR (RSV, FLU A&B, COVID)  RVPGX2  ETHANOL  CBC     EKG  None   RADIOLOGY None   PROCEDURES:  Critical Care performed: No  Procedures   MEDICATIONS ORDERED IN ED: Medications - No data to display   IMPRESSION / MDM / [place] / ED COURSE  I reviewed the triage vital signs and the nursing notes.                              Differential diagnosis includes, but is not limited to, psychiatric illness, drug induced psychosis  Patient's presentation is most consistent with acute presentation with potential threat to life or bodily function.  Patient presented to the emergency department today because of concerns for auditory hallucinations.  Patient does have history of schizophrenia.  Will have psychiatry evaluate.  The patient has been placed in psychiatric observation due to the need to provide a safe environment for the patient while obtaining psychiatric consultation and evaluation, as well as ongoing medical and medication management to treat the patient's condition.  The patient has not been placed under full IVC at this time.   Psychiatry has evaluated the patient and recommends inpatient admission.   FINAL CLINICAL IMPRESSION(S) / ED DIAGNOSES   Final diagnoses:  Auditory hallucination      Note:  This document was prepared using Dragon voice recognition software and may include unintentional dictation errors.    [name], MD 02/02/22 [phone]

## 2022-02-01 NOTE — ED Notes (Signed)
Pt reports chronic AH since 32 yo. States AH scared him tonight. Also reports vague HI but not to any staff here.

## 2022-02-01 NOTE — ED Notes (Signed)
Pt dressed out into wine scrubs and wanded. All belongings placed in 2 bags, one rain-soaked black jacket not bagged, but labeled with sticker.   Contents include: White shirt, pink toboggan, blue pants, 2 yellow necklaces, 2 sandwich bags containing phone and wallet, sneakers.

## 2022-02-01 NOTE — ED Triage Notes (Signed)
Pt in with [facility], states he has been hearing voices. Denies any SI/HI/VH or recent substance use, reports that his mother told him to come here for evaluation

## 2022-02-01 NOTE — ED Notes (Signed)
Pt given sandwich tray and drink. 

## 2022-02-01 NOTE — ED Triage Notes (Signed)
FIRST NURSE NOTE:  Pt arrived voluntary with [employer] officer pt states he is hearing voices and wants to be admitted.

## 2022-02-02 DIAGNOSIS — F250 Schizoaffective disorder, bipolar type: Secondary | ICD-10-CM | POA: Diagnosis not present

## 2022-02-02 MED ORDER — DIVALPROEX SODIUM ER 250 MG PO TB24
500.0000 mg | ORAL_TABLET | Freq: Three times a day (TID) | ORAL | Status: DC
  Administered 2022-02-02: 500 mg via ORAL
  Filled 2022-02-02: qty 2

## 2022-02-02 MED ORDER — QUETIAPINE FUMARATE ER 200 MG PO TB24
400.0000 mg | ORAL_TABLET | Freq: Every day | ORAL | Status: DC
  Administered 2022-02-02: 400 mg via ORAL
  Filled 2022-02-02: qty 2

## 2022-02-02 MED ORDER — DIPHENHYDRAMINE HCL 25 MG PO CAPS
50.0000 mg | ORAL_CAPSULE | Freq: Once | ORAL | Status: AC
  Administered 2022-02-02: 50 mg via ORAL
  Filled 2022-02-02: qty 2

## 2022-02-02 MED ORDER — HYDROXYZINE HCL 25 MG PO TABS: 25.0000 mg | ORAL_TABLET | Freq: Three times a day (TID) | ORAL | Status: DC | PRN

## 2022-02-02 NOTE — ED Notes (Signed)
VOL  

## 2022-02-02 NOTE — BH Assessment (Signed)
Spoke with [facility] [phone]) and informed them the patient does not need inpatient treatment.

## 2022-02-02 NOTE — ED Notes (Signed)
Pt given belongings and dc papers. Pt ambulating to bathroom, gait steady.

## 2022-02-02 NOTE — Progress Notes (Addendum)
Client requested to leave as he felt he was at his baseline and wanted to return home. He reported his voices were at baseline, clear and coherent with no threat to self or others, psych cleared.  His address confirmed with his ACT team and cab voucher obtained.    Waylan Boga, PMHNP

## 2022-02-02 NOTE — ED Notes (Signed)
Report given to Maureen

## 2022-02-02 NOTE — BH Assessment (Signed)
Comprehensive Clinical Assessment (CCA) Note  02/02/2022 Kenneth Jensen [id number]  Chief Complaint:  Chief Complaint  Patient presents with   Psychiatric Evaluation   Kenneth Jensen arrived to the ED by way of transportation by law enforcement.  He presents voluntarily, stating "I am hearing voices".  He expressed that he was hearing his mother's voices, and she lives in [place]. She tells him things that may or may be harmful.  He shared that he believes he has children that people are keeping him away from.  While speaking with the assessor, he began to respond in detail to internal stimuli.  Topics of conversation did not appear connected and topics varied rapidly.  Visit Diagnosis: Schizoaffective Disorder    CCA Screening, Triage and Referral (STR)  Patient Reported Information How did you hear about [place]? Self  Referral name: No data recorded Referral phone number: No data recorded  Whom do you see for routine medical problems? No data recorded Practice/Facility Name: No data recorded Practice/Facility Phone Number: No data recorded Name of Contact: No data recorded Contact Number: No data recorded Contact Fax Number: No data recorded Prescriber Name: No data recorded Prescriber Address (if known): No data recorded  What Is the Reason for Your Visit/Call Today? Altered mental status  How Long Has This Been Causing You Problems? 1 wk - 1 month  What Do You Feel Would Help You the Most Today? Treatment for Depression or other mood problem   Have You Recently Been in Any Inpatient Treatment (Hospital/Detox/Crisis Center/28-Day Program)? No data recorded Name/Location of Program/Hospital:No data recorded How Long Were You There? No data recorded When Were You Discharged? No data recorded  Have You Ever Received Services From [facility] Before? No data recorded Who Do You See at [facility]? No data recorded  Have You Recently Had Any Thoughts About Hurting Yourself?  No  Are You Planning to Commit Suicide/Harm Yourself At This time? No   Have you Recently Had Thoughts About [place]? No  Explanation: No data recorded  Have You Used Any Alcohol or Drugs in the Past 24 Hours? -- (Denied use, awaiting UDS)  How Long Ago Did You Use Drugs or Alcohol? No data recorded What Did You Use and How Much? Pt denies use   Do You Currently Have a Therapist/Psychiatrist? Yes  Name of Therapist/Psychiatrist: [facility]   Have You Been Recently Discharged From Any Office Practice or Programs? No  Explanation of Discharge From Practice/Program: No data recorded    CCA Screening Triage Referral Assessment Type of Contact: Face-to-Face  Is this Initial or Reassessment? No data recorded Date Telepsych consult ordered in [facility]:  No data recorded Time Telepsych consult ordered in [facility]:  No data recorded  Patient Reported Information Reviewed? No data recorded Patient Left Without Being Seen? No data recorded Reason for Not Completing Assessment: Patient was unable to provide coherent information.   Collateral Involvement: n/a   Does Patient Have a Stage manager Guardian? No data recorded Name and Contact of Legal Guardian: No data recorded If Minor and Not Living with Parent(s), Who has Custody? n/a  Is CPS involved or ever been involved? Never  Is APS involved or ever been involved? Never   Patient Determined To Be At Risk for Harm To Self or Others Based on Review of Patient Reported Information or Presenting Complaint? No  Method: Plan with intent and identified person  Availability of Means: No access or NA  Intent: Vague intent or  NA  Notification Required: Identifiable person is aware  Additional Information for Danger to Others Potential: Active psychosis  Additional Comments for Danger to Others Potential: n/a  Are There Guns or Other Weapons in Your Home? No  Types of Guns/Weapons: No data recorded Are These  Weapons Safely Secured?                            -- (N.a)  Who Could Verify You Are Able To Have These Secured: No data recorded Do You Have any Outstanding Charges, Pending Court Dates, Parole/Probation? None  Contacted To Inform of Risk of Harm To Self or Others: Other: Comment   Location of Assessment: [facility] ED   Does Patient Present under Involuntary Commitment? No  IVC Papers Initial File Date: 04/21/21   [place] of Residence: [place]   Patient Currently Receiving the Following Services: ACTT Psychologist, educational)   Determination of Need: Emergent (2 hours)   Options For Referral: Inpatient Hospitalization     CCA Biopsychosocial Intake/Chief Complaint:  No data recorded Current Symptoms/Problems: No data recorded  Patient Reported Schizophrenia/Schizoaffective Diagnosis in Past: Yes   Strengths: Some insight, seeking help, receive treatment services.  Preferences: No data recorded Abilities: No data recorded  Type of Services Patient Feels are Needed: No data recorded  Initial Clinical Notes/Concerns: No data recorded  Mental Health Symptoms Depression:   Tearfulness; Difficulty Concentrating   Duration of Depressive symptoms:  Greater than two weeks   Mania:   N/A   Anxiety:    N/A   Psychosis:   Delusions; Hallucinations   Duration of Psychotic symptoms:  Greater than six months   Trauma:   N/A   Obsessions:   N/A   Compulsions:   N/A   Inattention:   N/A   Hyperactivity/Impulsivity:   N/A   Oppositional/Defiant Behaviors:   N/A   Emotional Irregularity:   N/A   Other Mood/Personality Symptoms:  No data recorded   Mental Status Exam Appearance and self-care  Stature:   Average   Weight:   Average weight   Clothing:   Disheveled   Grooming:   Neglected   Cosmetic use:   None   Posture/gait:   Normal   Motor activity:   Not Remarkable   Sensorium  Attention:   Distractible    Concentration:   Focuses on irrelevancies   Orientation:   Person; Place; Object   Recall/memory:   Normal   Affect and Mood  Affect:   Anxious; Labile   Mood:   Irritable   Relating  Eye contact:   Fleeting   Facial expression:   Anxious   Attitude toward examiner:   Cooperative   Thought and Language  Speech flow:  Flight of Ideas   Thought content:   Delusions; Suspicious   Preoccupation:   Ruminations   Hallucinations:   Auditory; Visual   Organization:  No data recorded  [employer] of Knowledge:   Fair   Intelligence:   Average   Abstraction:   Functional   Judgement:   Fair   Dance movement psychotherapist:   Distorted   Insight:   Poor   Decision Making:   Confused   Social Functioning  Social Maturity:   Impulsive; Responsible   Social Judgement:   Chemical engineer"; Heedless   Stress  Stressors:   Housing; Surveyor, quantity; Transitions   Coping Ability:   Exhausted; Deficient supports   Skill Deficits:  Self-care   Supports:   Friends/Service system     Religion:    Leisure/Recreation:    Exercise/Diet:     CCA Employment/Education Employment/Work Situation: Employment / Work Technical sales engineer: On disability Patient's Job has Been Impacted by Current Illness: No Has Patient ever Been in the [employer]?: No  Education: Education Did Physicist, medical?: No Did You Have An Individualized Education Program (IIEP): No Did You Have Any Difficulty At [employer]?: No   CCA Family/Childhood History Family and Relationship History: Family history Does patient have children?: No  Childhood History:  Childhood History By whom was/is the patient raised?: Mother Did patient suffer any verbal/emotional/physical/sexual abuse as a child?: No Has patient ever been sexually abused/assaulted/raped as an adolescent or adult?: No Witnessed domestic violence?: No Has patient been affected by domestic violence as  an adult?: No  Child/Adolescent Assessment:     CCA Substance Use Alcohol/Drug Use: Alcohol / Drug Use Pain Medications: See PTA Prescriptions: See PTA Over the Counter: See PTA History of alcohol / drug use?: Yes Longest period of sobriety (when/how long): Unable to quantify Negative Consequences of Use: Personal relationships Withdrawal Symptoms: None                         ASAM's:  Six Dimensions of Multidimensional Assessment  Dimension 1:  Acute Intoxication and/or Withdrawal Potential:   Dimension 1:  Description of individual's past and current experiences of substance use and withdrawal: Pt has a hx of and current use of cannabis  Dimension 2:  Biomedical Conditions and Complications:   Dimension 2:  Description of patient's biomedical conditions and  complications: None noted  Dimension 3:  Emotional, Behavioral, or Cognitive Conditions and Complications:     Dimension 4:  Readiness to Change:     Dimension 5:  Relapse, Continued use, or Continued Problem Potential:     Dimension 6:  Recovery/Living Environment:     ASAM Severity Score:    ASAM Recommended Level of Treatment:     Substance use Disorder (SUD)    Recommendations for Services/Supports/Treatments: Recommendations for Services/Supports/Treatments Recommendations For Services/Supports/Treatments: Inpatient Hospitalization, ACCTT (Assertive Community Treatment)  DSM5 Diagnoses: Patient Active Problem List   Diagnosis Date Noted   Cocaine abuse ([place]) 11/22/2021   Prediabetes 05/16/2020   Schizoaffective disorder, bipolar type ([place]) 03/25/2019   Cannabis use disorder, moderate, dependence ([place]) 04/21/2011   Tobacco use disorder 04/21/2011   @[email or web address] @  Elmer Bales, Counselor

## 2022-02-02 NOTE — ED Notes (Signed)
Pt alert and calm, eating lunch. Pt given sprite per request.

## 2022-02-02 NOTE — ED Notes (Signed)
Pt awake, asked for and given snack and drink. Pt states he wants to go home. EDP and NP notified. Pt asked how he will get home, pt states that is his business and will not share with this RN.

## 2022-02-02 NOTE — Discharge Instructions (Addendum)
You have been seen in the emergency department for a  psychiatric concern. You have been evaluated both medically as well as psychiatrically. Please follow-up with your outpatient resources provided. Return to the emergency department for any worsening symptoms, or any thoughts of hurting yourself or anyone else so that we may attempt to help you.

## 2022-02-02 NOTE — ED Notes (Signed)
Pt given breakfast tray

## 2022-02-02 NOTE — ED Notes (Signed)
Referral information for Psychiatric Hospitalization faxed to;   [facility] [phone]- [phone])  [facility] [phone]),   [facility] [phone] -[phone]) [phone]  [facility] [phone]),  [place] [phone], [phone], [phone] or [phone]),   [place] [phone]--- [phone]--- [phone]--- [phone])  [address] [phone]),   [place] [phone]- [phone]),   [place] [phone] phone-- [phone])  [facility] [phone])  [facility] [phone])

## 2022-02-02 NOTE — BH Assessment (Signed)
Patient reassessed again from this morning, he continues to be at baseline and improve sense initial arrival. Patient was able to have conversation and remain engaged. He was clear and coherent. NP and TTS explained to the patient, didn't have to walk, and would be better for him to get transporation home, due to the distance.He voice his understanding and was in agreement. His home address was confirmed with patient's ACT Team, which was different than the one in the chart.  RN was able to get him a cab voucher for transportation.

## 2022-02-02 NOTE — ED Notes (Signed)
Pt states he is ready to leave, refusing transportation assistance, states he will walk, refusing to call for a ride. EDP aware. Pt belongings retrieved from storage area. Pt given dc instructions, states understanding.

## 2022-02-02 NOTE — ED Provider Notes (Signed)
-----------------------------------------   3:09 PM on 02/02/2022 -----------------------------------------  Patient has been seen and evaluated by psychiatry.  They believe the patient is safe for discharge home from a psychiatric standpoint and does not meet IVC criteria.  Patient's medical workup has been largely nonrevealing.  Patient is requesting discharge home.  Will discharge with outpatient resources.   Harvest Dark, MD 02/02/22 [phone]

## 2022-02-02 NOTE — ED Notes (Signed)
Pt given meal and drink, pt sleeping at this time.

## 2022-02-02 NOTE — ED Notes (Signed)
VOL/pending psych consult 

## 2022-02-02 NOTE — ED Notes (Signed)
Pt resting at this time. Will administer meds when pt is awake

## 2022-02-02 NOTE — ED Notes (Signed)
Hospital meal provided, pt tolerated w/o complaints.  Waste discarded appropriately.  

## 2022-02-02 NOTE — Consult Note (Signed)
[facility] Consult   Reason for Consult:  hallucinations Referring Physician:  EDP  Patient Identification: Kenneth Jensen MRN:  [id number] Principal Diagnosis: Schizoaffective disorder, bipolar type ([place]) Diagnosis:  Principal Problem:   Schizoaffective disorder, bipolar type ([place])   Total Time spent with patient: 45 minutes  Subjective:   Kenneth Jensen is a 32 y.o. male patient admitted with auditory hallucinations stating he is hearing his mother voice.  HPI:  Pt presents to ED voluntary for auditory hallucinations for an evaluation. Pt reports increase in hearing his mother's voice. Mother lives in [place] and patient lives alone at this time. Pt states " I just needed a place to clear my head".  Pt reports he is taking his medications "two pills 500 mg" but is unsure of the names. Pt denies SI/ HI and substance use at this time. Pt cooperative and pleasant during assessment. Pt has hx of chronic auditory hallucinations and is an active patient with the [facility] ACT team.  Medications restarted and will evaluate his progress later today.  This provider contacted [facility] ACT team to confirm treatment and active medications prescribed. Staff at [facility] confirmed patient is active and was seen by provider. Pt actively on Depakote ER 500 mg TID, Seroquel XR 400 mg QD, Atarax 25 mg TID PRN, and Invega LAI. Last dose of LAI unable to be confirmed by staff or patient.   Past Psychiatric History: Schizoaffective disorder; Active ACT with [facility]   Risk to Self:  Low Risk to Others:  Low Prior Inpatient Therapy: Hx of hospitalizations  Prior Outpatient Therapy:  ACT team  Past Medical History:  Past Medical History:  Diagnosis Date   Asthma    Bipolar 1 disorder ([place])    Depression    Schizophrenia ([place])    Schizotaxia    No past surgical history on file. Family History: No family history on file. Family Psychiatric  History: Not available  Social History:  Social  History   Substance and Sexual Activity  Alcohol Use Yes   Comment: social     Social History   Substance and Sexual Activity  Drug Use Yes   Types: Marijuana    Social History   Socioeconomic History   Marital status: Single    Spouse name: Not on file   Number of children: Not on file   Years of education: Not on file   Highest education level: Not on file  Occupational History   Not on file  Tobacco Use   Smoking status: Every Day    Packs/day: 0.50    Types: Cigarettes   Smokeless tobacco: Never  Substance and Sexual Activity   Alcohol use: Yes    Comment: social   Drug use: Yes    Types: Marijuana   Sexual activity: Not Currently  Other Topics Concern   Not on file  Social History Narrative   ** Merged History Encounter **       Social Determinants of Health   Financial Resource Strain: Not on file  Food Insecurity: Not on file  Transportation Needs: Not on file  Physical Activity: Not on file  Stress: Not on file  Social Connections: Not on file   Additional Social History:    Allergies:   Allergies  Allergen Reactions   Amoxicillin Swelling   Haldol [Haloperidol]     Pt states "I went crazy"   Percocet [Oxycodone-Acetaminophen]    Vicodin [Hydrocodone-Acetaminophen] Itching   Vicodin [Hydrocodone-Acetaminophen]     Labs:  Results for orders placed or performed during the hospital encounter of 02/01/22 (from the past 48 hour(s))  Comprehensive metabolic panel     Status: Abnormal   Collection Time: 02/01/22 10:14 PM  Result Value Ref Range   Sodium 135 135 - 145 mmol/L   Potassium 3.7 3.5 - 5.1 mmol/L   Chloride 101 98 - 111 mmol/L   CO2 23 22 - 32 mmol/L   Glucose, Bld 91 70 - 99 mg/dL    Comment: Glucose reference range applies only to samples taken after fasting for at least 8 hours.   BUN 12 6 - 20 mg/dL   Creatinine, Ser 0.84 0.61 - 1.24 mg/dL   Calcium 8.4 (L) 8.9 - 10.3 mg/dL   Total Protein 7.1 6.5 - 8.1 g/dL   Albumin 3.9 3.5  - 5.0 g/dL   AST 18 15 - 41 U/L   ALT 10 0 - 44 U/L   Alkaline Phosphatase 62 38 - 126 U/L   Total Bilirubin 0.6 0.3 - 1.2 mg/dL   GFR, Estimated >60 >60 mL/min    Comment: (NOTE) Calculated using the CKD-EPI Creatinine Equation (2021)    Anion gap 11 5 - 15    Comment: Performed at [facility], [address] [place], [place] [address]  Ethanol     Status: None   Collection Time: 02/01/22 10:14 PM  Result Value Ref Range   Alcohol, Ethyl (B) <10 <10 mg/dL    Comment: (NOTE) Lowest detectable limit for serum alcohol is 10 mg/dL.  For medical purposes only. Performed at [facility], [address] [place], [place] [address]   Salicylate level     Status: Abnormal   Collection Time: 02/01/22 10:14 PM  Result Value Ref Range   Salicylate Lvl <4.0 (L) 7.0 - 30.0 mg/dL    Comment: Performed at [facility], [address] [place], [place] [address]  Acetaminophen level     Status: Abnormal   Collection Time: 02/01/22 10:14 PM  Result Value Ref Range   Acetaminophen (Tylenol), Serum <10 (L) 10 - 30 ug/mL    Comment: (NOTE) Therapeutic concentrations vary significantly. A range of 10-30 ug/mL  may be an effective concentration for many patients. However, some  are best treated at concentrations outside of this range. Acetaminophen concentrations >150 ug/mL at 4 hours after ingestion  and >50 ug/mL at 12 hours after ingestion are often associated with  toxic reactions.  Performed at [facility], [address] [place], [place] [address]   cbc     Status: None   Collection Time: 02/01/22 10:14 PM  Result Value Ref Range   WBC 9.0 4.0 - 10.5 K/uL   RBC 4.55 4.22 - 5.81 MIL/uL   Hemoglobin 13.4 13.0 - 17.0 g/dL   HCT 39.4 39.0 - 52.0 %   MCV 86.6 80.0 - 100.0 fL   MCH 29.5 26.0 - 34.0 pg   MCHC 34.0 30.0 - 36.0 g/dL   RDW 12.7 11.5 - 15.5 %   Platelets 363 150 - 400 K/uL   nRBC 0.0 0.0 - 0.2 %    Comment: Performed at [facility], [address], [place], [place] [address]  Urine Drug Screen, Qualitative     Status: Abnormal   Collection Time: 02/01/22 10:14 PM  Result Value Ref Range   Tricyclic, Ur Screen POSITIVE (A) NONE DETECTED   Amphetamines, Ur Screen NONE DETECTED NONE DETECTED   MDMA (Ecstasy)Ur Screen NONE DETECTED NONE  DETECTED   Cocaine Metabolite,Ur [place] NONE DETECTED NONE DETECTED   Opiate, Ur Screen NONE DETECTED NONE DETECTED   Phencyclidine (PCP) Ur S NONE DETECTED NONE DETECTED   Cannabinoid 50 Ng, Ur [place] POSITIVE (A) NONE DETECTED   Barbiturates, Ur Screen NONE DETECTED NONE DETECTED   Benzodiazepine, Ur Scrn NONE DETECTED NONE DETECTED   Methadone Scn, Ur NONE DETECTED NONE DETECTED    Comment: (NOTE) Tricyclics + metabolites, urine    Cutoff 1000 ng/mL Amphetamines + metabolites, urine  Cutoff 1000 ng/mL MDMA (Ecstasy), urine              Cutoff 500 ng/mL Cocaine Metabolite, urine          Cutoff 300 ng/mL Opiate + metabolites, urine        Cutoff 300 ng/mL Phencyclidine (PCP), urine         Cutoff 25 ng/mL Cannabinoid, urine                 Cutoff 50 ng/mL Barbiturates + metabolites, urine  Cutoff 200 ng/mL Benzodiazepine, urine              Cutoff 200 ng/mL Methadone, urine                   Cutoff 300 ng/mL  The urine drug screen provides only a preliminary, unconfirmed analytical test result and should not be used for non-medical purposes. Clinical consideration and professional judgment should be applied to any positive drug screen result due to possible interfering substances. A more specific alternate chemical method must be used in order to obtain a confirmed analytical result. Gas chromatography / mass spectrometry (GC/MS) is the preferred confirm atory method. Performed at [facility], [address] [place], [place] [address]   Resp panel by RT-PCR (RSV, Flu A&B, Covid) Anterior Nasal Swab     Status: None   Collection Time: 02/01/22 10:27 PM    Specimen: Anterior Nasal Swab  Result Value Ref Range   SARS Coronavirus 2 by RT PCR NEGATIVE NEGATIVE    Comment: (NOTE) SARS-CoV-2 target nucleic acids are NOT DETECTED.  The SARS-CoV-2 RNA is generally detectable in upper respiratory specimens during the acute phase of infection. The lowest concentration of SARS-CoV-2 viral copies this assay can detect is 138 copies/mL. A negative result does not preclude SARS-Cov-2 infection and should not be used as the sole basis for treatment or other patient management decisions. A negative result may occur with  improper specimen collection/handling, submission of specimen other than nasopharyngeal swab, presence of viral mutation(s) within the areas targeted by this assay, and inadequate number of viral copies(<138 copies/mL). A negative result must be combined with clinical observations, patient history, and epidemiological information. The expected result is Negative.  Fact Sheet for Patients:  EntrepreneurPulse.com.au  Fact Sheet for Healthcare Providers:  IncredibleEmployment.be  This test is no t yet approved or cleared by the [place] FDA and  has been authorized for detection and/or diagnosis of SARS-CoV-2 by FDA under an Emergency Use Authorization (EUA). This EUA will remain  in effect (meaning this test can be used) for the duration of the COVID-19 declaration under Section 564(b)(1) of the Act, 21 U.S.C.section 360bbb-3(b)(1), unless the authorization is terminated  or revoked sooner.       Influenza A by PCR NEGATIVE NEGATIVE   Influenza B by PCR NEGATIVE NEGATIVE    Comment: (NOTE) The Xpert Xpress SARS-CoV-2/FLU/RSV plus assay is intended as an aid in the diagnosis of influenza  from Nasopharyngeal swab specimens and should not be used as a sole basis for treatment. Nasal washings and aspirates are unacceptable for Xpert Xpress SARS-CoV-2/FLU/RSV testing.  Fact Sheet for  Patients: EntrepreneurPulse.com.au  Fact Sheet for Healthcare Providers: IncredibleEmployment.be  This test is not yet approved or cleared by the [place] FDA and has been authorized for detection and/or diagnosis of SARS-CoV-2 by FDA under an Emergency Use Authorization (EUA). This EUA will remain in effect (meaning this test can be used) for the duration of the COVID-19 declaration under Section 564(b)(1) of the Act, 21 U.S.C. section 360bbb-3(b)(1), unless the authorization is terminated or revoked.     Resp Syncytial Virus by PCR NEGATIVE NEGATIVE    Comment: (NOTE) Fact Sheet for Patients: EntrepreneurPulse.com.au  Fact Sheet for Healthcare Providers: IncredibleEmployment.be  This test is not yet approved or cleared by the [place] FDA and has been authorized for detection and/or diagnosis of SARS-CoV-2 by FDA under an Emergency Use Authorization (EUA). This EUA will remain in effect (meaning this test can be used) for the duration of the COVID-19 declaration under Section 564(b)(1) of the Act, 21 U.S.C. section 360bbb-3(b)(1), unless the authorization is terminated or revoked.  Performed at [facility], [address] [place], [place] [address]     Current Facility-Administered Medications  Medication Dose Route Frequency Provider Last Rate Last Admin   divalproex (DEPAKOTE ER) 24 hr tablet 500 mg  500 mg Oral TID Patrecia Pour, NP       hydrOXYzine (ATARAX) tablet 25 mg  25 mg Oral TID PRN Patrecia Pour, NP       QUEtiapine (SEROQUEL XR) 24 hr tablet 400 mg  400 mg Oral Daily Patrecia Pour, NP       Current Outpatient Medications  Medication Sig Dispense Refill   divalproex (DEPAKOTE ER) 500 MG 24 hr tablet Take 1 tablet (500 mg total) by mouth 3 (three) times daily. 90 tablet 1   hydrOXYzine (ATARAX) 25 MG tablet Take 1 tablet (25 mg total) by mouth 3 (three) times  daily as needed for anxiety. 30 tablet 1   paliperidone (INVEGA SUSTENNA) 234 MG/1.5ML injection Inject 234 mg into the muscle every 28 (twenty-eight) days. 1.8 mL 1   QUEtiapine (SEROQUEL XR) 400 MG 24 hr tablet Take 1 tablet by mouth daily. (Patient not taking: Reported on 11/21/2021)     QUEtiapine (SEROQUEL) 200 MG tablet Take 200 mg by mouth at bedtime.     QUEtiapine (SEROQUEL) 400 MG tablet Take 1 tablet (400 mg total) by mouth at bedtime. (Patient not taking: Reported on 11/21/2021) 30 tablet 1   traZODone (DESYREL) 50 MG tablet Take 1 tablet (50 mg total) by mouth at bedtime as needed for sleep. 30 tablet 0    Musculoskeletal: Strength & Muscle Tone: within normal limits Gait & Station: normal Patient leans: N/A  Psychiatric Specialty Exam: Physical Exam Vitals and nursing note reviewed.  Constitutional:      Appearance: Normal appearance.  HENT:     Head: Normocephalic.     Nose: Nose normal.  Pulmonary:     Effort: Pulmonary effort is normal.  Musculoskeletal:        General: Normal range of motion.     Cervical back: Normal range of motion.  Neurological:     General: No focal deficit present.     Mental Status: He is alert and oriented to person, place, and time.  Psychiatric:        Attention  and Perception: Attention normal. He perceives auditory hallucinations.        Mood and Affect: Mood is anxious.        Speech: Speech normal.        Behavior: Behavior normal. Behavior is cooperative.        Thought Content: Thought content normal.        Cognition and Memory: Cognition and memory normal.        Judgment: Judgment normal.     Review of Systems  Psychiatric/Behavioral:  Positive for hallucinations and substance abuse. The patient is nervous/anxious.   All other systems reviewed and are negative.   Blood pressure 131/85, pulse 75, temperature 98.2 F (36.8 C), temperature source Oral, resp. rate 18, weight 89.8 kg, SpO2 94 %.Body mass index is 32.95  kg/m.  General Appearance: Fairly Groomed  Eye Contact:  Fair  Speech:  Clear and Coherent and Normal Rate  Volume:  Normal  Mood:  Euthymic  Affect:  Appropriate  Thought Process:  Coherent  Orientation:  Full (Time, Place, and Person)  Thought Content:  Logical and Hallucinations: Auditory  Suicidal Thoughts:  No  Homicidal Thoughts:  No  Memory:  Immediate;   Good Recent;   Good Remote;   Good  Judgement:  Good  Insight:  Good  Psychomotor Activity:  Normal  Concentration:  Concentration: Fair  Recall:  Fair  Fund of Knowledge:  Fair  Language:  Good  Akathisia:  No  Handed:  Right  AIMS (if indicated):     Assets:  Housing Social Support  ADL's:  Intact  Cognition:  WNL  Sleep:   WNL      Physical Exam: Physical Exam Vitals and nursing note reviewed.  Constitutional:      Appearance: Normal appearance.  HENT:     Head: Normocephalic.     Nose: Nose normal.  Pulmonary:     Effort: Pulmonary effort is normal.  Musculoskeletal:        General: Normal range of motion.     Cervical back: Normal range of motion.  Neurological:     General: No focal deficit present.     Mental Status: He is alert and oriented to person, place, and time.  Psychiatric:        Attention and Perception: Attention normal. He perceives auditory hallucinations.        Mood and Affect: Mood is anxious.        Speech: Speech normal.        Behavior: Behavior normal. Behavior is cooperative.        Thought Content: Thought content normal.        Cognition and Memory: Cognition and memory normal.        Judgment: Judgment normal.    Review of Systems  Psychiatric/Behavioral:  Positive for hallucinations and substance abuse. The patient is nervous/anxious.   All other systems reviewed and are negative.  Blood pressure 131/85, pulse 75, temperature 98.2 F (36.8 C), temperature source Oral, resp. rate 18, weight 89.8 kg, SpO2 94 %. Body mass index is 32.95 kg/m.  Treatment Plan  Summary: Plan Patient to be discharged from the emergency department to home and continue in the services of the [facility] ACT team.    Schizoaffective Disorder Continue medication as prescribed (Depakote ER, Seroquel XR, Atarax PRN)   Disposition: Re-evaluate later today  Nanine Means, NP 02/02/2022 9:53 AM

## 2022-02-06 ENCOUNTER — Telehealth: Payer: Self-pay

## 2022-02-06 ENCOUNTER — Other Ambulatory Visit: Payer: Self-pay

## 2022-02-06 ENCOUNTER — Emergency Department
Admission: EM | Admit: 2022-02-06 | Discharge: 2022-02-06 | Discharge status: Against Medical Advice | Payer: Medicare HMO | Attending: Student in an Organized Health Care Education/Training Program | Admitting: Student in an Organized Health Care Education/Training Program

## 2022-02-06 DIAGNOSIS — Z5321 Procedure and treatment not carried out due to patient leaving prior to being seen by health care provider: Secondary | ICD-10-CM | POA: Insufficient documentation

## 2022-02-06 DIAGNOSIS — F410 Panic disorder [episodic paroxysmal anxiety] without agoraphobia: Secondary | ICD-10-CM | POA: Diagnosis not present

## 2022-02-06 LAB — URINE DRUG SCREEN, QUALITATIVE (ARMC ONLY)
Amphetamines, Ur Screen: NOT DETECTED
Barbiturates, Ur Screen: NOT DETECTED
Benzodiazepine, Ur Scrn: NOT DETECTED
Cannabinoid 50 Ng, Ur ~~LOC~~: POSITIVE — AB
Cocaine Metabolite,Ur ~~LOC~~: POSITIVE — AB
MDMA (Ecstasy)Ur Screen: NOT DETECTED
Methadone Scn, Ur: NOT DETECTED
Opiate, Ur Screen: NOT DETECTED
Phencyclidine (PCP) Ur S: NOT DETECTED
Tricyclic, Ur Screen: NOT DETECTED

## 2022-02-06 NOTE — ED Triage Notes (Signed)
Pt is agitated and verbally abusive towards staff and this RN. Pt with handcuffs on. Pt states he wants to be in a room by himself. Pt denies SI and HI. Pt states he is having panic attacks. Pt cursing at staff and PD. Pt refusing to answer this RN"s questions at this point.

## 2022-02-06 NOTE — ED Notes (Signed)
Pt leaving department escorted by security at this time.

## 2022-02-06 NOTE — Telephone Encounter (Signed)
        Patient  visited [facility] on 02/02/2022  for Auditory hallucination.   Telephone encounter attempt :  1st  A HIPAA compliant voice message was left requesting a return call.  Instructed patient to call back at [phone].   [facility] Care Guide   [email or web address]  ?? [phone]   Website: [email or web address]  [email or web address]

## 2022-02-06 NOTE — ED Notes (Signed)
Pt belongings: Black t shirt, red nike shoes, black sweat pants, black underwear, gold chain, one pair white socks, black coat,  Belongings bagged and labled with pt's name.

## 2022-02-06 NOTE — ED Notes (Signed)
Pt asking for property at this time.  States he wants to leave.  Pt continues to deny SI/HI, property returned to pt.

## 2022-02-06 NOTE — ED Notes (Signed)
Pt verbally complaining about giving urine sample and will not give one at this time.

## 2022-02-07 DIAGNOSIS — Z5181 Encounter for therapeutic drug level monitoring: Secondary | ICD-10-CM | POA: Diagnosis not present

## 2022-02-07 DIAGNOSIS — F209 Schizophrenia, unspecified: Secondary | ICD-10-CM | POA: Diagnosis not present

## 2022-02-13 ENCOUNTER — Telehealth: Payer: Self-pay

## 2022-02-13 NOTE — Telephone Encounter (Signed)
        Patient  visited [place] on 2/1   Telephone encounter attempt :  1st  A HIPAA compliant voice message was left requesting a return call.  Instructed patient to call back    Madge Therrien Pop Health Care Guide, [place] [phone] [address], [place], [place] [address] Phone: [phone] Email: [email or web address]       

## 2022-02-14 ENCOUNTER — Telehealth: Payer: Self-pay

## 2022-02-14 NOTE — Telephone Encounter (Signed)
        Patient  visited [place] on 2/1    Telephone encounter attempt :  2nd  A HIPAA compliant voice message was left requesting a return call.  Instructed patient to call back .    Kazimierz Springborn Pop Health Care Guide, [place] [phone] [address], [place], [place] [address] Phone: [phone] Email: [email or web address]       

## 2022-02-16 ENCOUNTER — Encounter: Payer: Self-pay | Admitting: Emergency Medicine

## 2022-02-16 ENCOUNTER — Other Ambulatory Visit: Payer: Self-pay

## 2022-02-16 ENCOUNTER — Emergency Department
Admission: EM | Admit: 2022-02-16 | Discharge: 2022-02-17 | Disposition: A | Discharge status: Home / Self Care | Payer: Medicare HMO | Attending: Emergency Medicine | Admitting: Emergency Medicine

## 2022-02-16 DIAGNOSIS — R440 Auditory hallucinations: Secondary | ICD-10-CM | POA: Diagnosis not present

## 2022-02-16 DIAGNOSIS — Z1152 Encounter for screening for COVID-19: Secondary | ICD-10-CM | POA: Insufficient documentation

## 2022-02-16 DIAGNOSIS — F69 Unspecified disorder of adult personality and behavior: Secondary | ICD-10-CM | POA: Diagnosis not present

## 2022-02-16 DIAGNOSIS — R442 Other hallucinations: Secondary | ICD-10-CM | POA: Diagnosis not present

## 2022-02-16 DIAGNOSIS — F250 Schizoaffective disorder, bipolar type: Secondary | ICD-10-CM | POA: Insufficient documentation

## 2022-02-16 LAB — RESP PANEL BY RT-PCR (RSV, FLU A&B, COVID)  RVPGX2
Influenza A by PCR: NEGATIVE
Influenza B by PCR: NEGATIVE
Resp Syncytial Virus by PCR: NEGATIVE
SARS Coronavirus 2 by RT PCR: NEGATIVE

## 2022-02-16 NOTE — ED Triage Notes (Signed)
Presents via [facility] from his apartment for hallucinations (hearing voices all day, threatening him). Denies SI/HI, denies etOH or recreational drugs. Pt is calm and cooperative during triage information taking.   EMS vital : 147/86, 99%RA, 99bpm  Takes divalproex and seroquel.

## 2022-02-16 NOTE — ED Notes (Signed)
Belongings removed and placed in belonging bags in nursing desk. No belongings in safe. 2 bags total.  Wearing wine colored scrubs.

## 2022-02-16 NOTE — ED Notes (Signed)
Patient oriented to unit regarding cameras and rounding, verbalized understanding.

## 2022-02-16 NOTE — ED Provider Notes (Signed)
[facility] Provider Note    Event Date/Time   First MD Initiated Contact with Patient 02/16/22 1915     (approximate)   History   No chief complaint on file.   HPI  Kenneth Jensen is a 32 y.o. male with a history of schizoaffective disorder who presents with auditory hallucinations which have worsened over the last 1 to 2 weeks.  The patient hears voices that are threatening him.  He denies SI or HI.  He denies alcohol or drug use.  He states he is compliant with his medications but the symptoms persist.  He denies any acute medical complaints.  I reviewed the past medical records.  The patient was most recently evaluated psychiatry on 1/28 of this year after presenting with auditory hallucinations.  He was observed but at that time did not require inpatient psychiatric admission.   Physical Exam   Triage Vital Signs: ED Triage Vitals  Enc Vitals Group     BP 02/16/22 1914 133/80     Pulse Rate 02/16/22 1914 84     Resp 02/16/22 1914 20     Temp 02/16/22 1914 99.2 F (37.3 C)     Temp Source 02/16/22 1914 Oral     SpO2 02/16/22 1914 99 %     Weight 02/16/22 1916 197 lb (89.4 kg)     Height 02/16/22 1916 5' 6"$  (1.676 m)     Head Circumference --      Peak Flow --      Pain Score 02/16/22 1920 0     Pain Loc --      Pain Edu? --      Excl. in [place]? --     Most recent vital signs: Vitals:   02/16/22 1914  BP: 133/80  Pulse: 84  Resp: 20  Temp: 99.2 F (37.3 C)  SpO2: 99%     General: Awake, no distress.  CV:  Good peripheral perfusion.  Resp:  Normal effort.  Abd:  No distention.  Other:  Calm and cooperative.   ED Results / Procedures / Treatments   Labs (all labs ordered are listed, but only abnormal results are displayed) Labs Reviewed  RESP PANEL BY RT-PCR (RSV, FLU A&B, COVID)  [id number]     EKG     RADIOLOGY    PROCEDURES:  Critical Care performed: No  Procedures   MEDICATIONS ORDERED IN  ED: Medications - No data to display   IMPRESSION / MDM / [place] / ED COURSE  I reviewed the triage vital signs and the nursing notes.  32 year old male with PMH as noted above presents with auditory hallucinations.  He states he is compliant with his psych medications.  He denies any acute medical complaints.  Differential diagnosis includes, but is not limited to, exacerbation of schizoaffective disorder, other acute psychosis, less likely substance-induced symptoms.  We will obtain psychiatry and TTS consults and reassess.  Patient's presentation is most consistent with exacerbation of chronic illness.  The patient has been placed in psychiatric observation due to the need to provide a safe environment for the patient while obtaining psychiatric consultation and evaluation, as well as ongoing medical and medication management to treat the patient's condition.  The patient has not been placed under full IVC at this time.   ----------------------------------------- 11:42 PM on 02/16/2022 -----------------------------------------  I consulted and discussed the case with the psychiatry provider who recommends overnight observation and reassessment in the morning.   FINAL CLINICAL  IMPRESSION(S) / ED DIAGNOSES   Final diagnoses:  Auditory hallucination     Rx / DC Orders   ED Discharge Orders     None        Note:  This document was prepared using Dragon voice recognition software and may include unintentional dictation errors.    Arta Silence, MD 02/16/22 2342

## 2022-02-16 NOTE — BH Assessment (Signed)
Comprehensive Clinical Assessment (CCA) Screening, Triage and Referral Note  02/16/2022 Kenneth Jensen Recommendations for Services/Supports/Treatments: Consulted with Kenneth D., NP, who recommended pt. be observed overnight and reassessed in the morning. Notified Dr. Cherylann Banas and Arrie Aran, RN of disposition recommendation.   Kenneth Jensen is a 32 year old, English speaking, Black male with a hx of schizoaffective disorder. Pt presented to [facility] ED under Vol from home with complaints of auditory hallucinations. Pt identified his main stressors of feeling neglected by her mother. When asked why he'd presented to the ER pt. stating, "I want to talk to my mother; but I really want to cuss her out." The patient admitted to occasional substance abuse, but would not specify. Pt was preoccupied with not having any contact with his mother, and had poor insight. Pt endorsed feeling depressed. The pt. made fair eye contact. Pt reported that he has decreased sleep and an increased appetite due to cannabis consumption. Pt denied symptoms of paranoia, and did not appear to be responding to internal or external stimuli. Pt's thoughts were linear and he was oriented x4. Pt had slowed speech/psychomotor activity. Pt presented with a dysphoric mood; affect was responsive. Pt endorsed AH, reporting that the voices tell him he's not going to be a good person. Pt denied current SI/HI/V/H.  Chief Complaint: No chief complaint on file.  Visit Diagnosis: Schizoaffective disorder, bipolar type [facility])   Patient Reported Information How did you hear about [place]? Self  What Is the Reason for Your Visit/Call Today? Presents via [facility] from his apartment for hallucinations (hearing voices all day, threatening him).  How Long Has This Been Causing You Problems? > than 6 months  What Do You Feel Would Help You the Most Today? Medication(s); Treatment for Depression or other mood problem   Have You Recently Had  Any Thoughts About Hurting Yourself? No  Are You Planning to Commit Suicide/Harm Yourself At This time? No   Have you Recently Had Thoughts About [place]? No  Are You Planning to Harm Someone at This Time? No  Explanation: No data recorded  Have You Used Any Alcohol or Drugs in the Past 24 Hours? No  How Long Ago Did You Use Drugs or Alcohol? No data recorded What Did You Use and How Much? Pt denies use   Do You Currently Have a Therapist/Psychiatrist? Yes  Name of Therapist/Psychiatrist: [facility]   Have You Been Recently Discharged From Any Office Practice or Programs? No  Explanation of Discharge From Practice/Program: No data recorded   CCA Screening Triage Referral Assessment Type of Contact: Face-to-Face  Telemedicine Service Delivery:   Is this Initial or Reassessment?   Date Telepsych consult ordered in [facility]:    Time Telepsych consult ordered in [facility]:    Location of Assessment: [facility] ED  Provider Location: [facility] ED    Collateral Involvement: n/a   Does Patient Have a [place]? No data recorded Name and Contact of Legal Guardian: No data recorded If Minor and Not Living with Parent(s), Who has Custody? n/a  Is CPS involved or ever been involved? Never  Is APS involved or ever been involved? Never   Patient Determined To Be At Risk for Harm To Self or Others Based on Review of Patient Reported Information or Presenting Complaint? No  Method: No Plan  Availability of Means: No access or NA  Intent: Vague intent or NA  Notification Required: No need or identified person  Additional Information for Danger  to Others Potential: Active psychosis  Additional Comments for Danger to Others Potential: n/a  Are There Guns or Other Weapons in [place]? No  Types of Guns/Weapons: No data recorded Are These Weapons Safely Secured?                            -- (n/a)  Who Could Verify You Are Able To Have These Secured: No  data recorded Do You Have any Outstanding Charges, Pending Court Dates, Parole/Probation? None  Contacted To Inform of Risk of Harm To Self or Others: Other: Comment   Does Patient Present under Involuntary Commitment? No    [place] of Residence: [place]   Patient Currently Receiving the Following Services: ACTT Architect)   Determination of Need: Urgent (48 hours)   Options For Referral: ED Visit   Discharge Disposition:     Freada Twersky R Clarita Mcelvain, LCAS

## 2022-02-16 NOTE — ED Notes (Signed)
Vol /psych consult pending / moved to bhu 4

## 2022-02-17 DIAGNOSIS — R440 Auditory hallucinations: Secondary | ICD-10-CM | POA: Diagnosis not present

## 2022-02-17 NOTE — ED Notes (Signed)
Lunch meal provided.

## 2022-02-17 NOTE — ED Notes (Signed)
Breakfast given.  

## 2022-02-17 NOTE — ED Provider Notes (Signed)
Emergency Medicine Observation Re-evaluation Note  Physical Exam   BP 133/80 (BP Location: Left Arm)   Pulse 84   Temp 99.2 F (37.3 C) (Oral)   Resp 20   Ht 5' 6"$  (1.676 m)   Wt 89 kg   SpO2 99%   BMI 31.67 kg/m   Patient resting, unlabored breathing, no acute distress.  ED Course / MDM   No reported events during my shift at the time of this note.   Pt is awaiting dispo from SW   Lucillie Garfinkel MD    Lucillie Garfinkel, MD 02/17/22 [phone]

## 2022-02-17 NOTE — ED Notes (Signed)
Snack given.

## 2022-02-17 NOTE — ED Notes (Signed)
VOL/pending psych consult 

## 2022-02-28 ENCOUNTER — Emergency Department
Admission: EM | Admit: 2022-02-28 | Discharge: 2022-02-28 | Disposition: A | Discharge status: Home / Self Care | Payer: Medicare HMO | Attending: Student in an Organized Health Care Education/Training Program | Admitting: Student in an Organized Health Care Education/Training Program

## 2022-02-28 DIAGNOSIS — R443 Hallucinations, unspecified: Secondary | ICD-10-CM | POA: Diagnosis present

## 2022-02-28 DIAGNOSIS — J45909 Unspecified asthma, uncomplicated: Secondary | ICD-10-CM | POA: Diagnosis not present

## 2022-02-28 DIAGNOSIS — F1410 Cocaine abuse, uncomplicated: Secondary | ICD-10-CM | POA: Diagnosis present

## 2022-02-28 DIAGNOSIS — F17210 Nicotine dependence, cigarettes, uncomplicated: Secondary | ICD-10-CM | POA: Insufficient documentation

## 2022-02-28 DIAGNOSIS — F209 Schizophrenia, unspecified: Secondary | ICD-10-CM

## 2022-02-28 DIAGNOSIS — F250 Schizoaffective disorder, bipolar type: Secondary | ICD-10-CM

## 2022-02-28 LAB — COMPREHENSIVE METABOLIC PANEL
ALT: 13 U/L (ref 0–44)
AST: 23 U/L (ref 15–41)
Albumin: 4 g/dL (ref 3.5–5.0)
Alkaline Phosphatase: 73 U/L (ref 38–126)
Anion gap: 14 (ref 5–15)
BUN: 12 mg/dL (ref 6–20)
CO2: 26 mmol/L (ref 22–32)
Calcium: 8.8 mg/dL — ABNORMAL LOW (ref 8.9–10.3)
Chloride: 98 mmol/L (ref 98–111)
Creatinine, Ser: 0.86 mg/dL (ref 0.61–1.24)
GFR, Estimated: >60 mL/min (ref >60)
Glucose, Bld: 95 mg/dL (ref 70–99)
Potassium: 4.2 mmol/L (ref 3.5–5.1)
Sodium: 138 mmol/L (ref 135–145)
Total Bilirubin: 0.6 mg/dL (ref 0.3–1.2)
Total Protein: 7.5 g/dL (ref 6.5–8.1)

## 2022-02-28 LAB — URINE DRUG SCREEN, QUALITATIVE (ARMC ONLY)
Amphetamines, Ur Screen: NOT DETECTED
Barbiturates, Ur Screen: NOT DETECTED
Benzodiazepine, Ur Scrn: NOT DETECTED
Cannabinoid 50 Ng, Ur ~~LOC~~: POSITIVE — AB
Cocaine Metabolite,Ur ~~LOC~~: POSITIVE — AB
MDMA (Ecstasy)Ur Screen: NOT DETECTED
Methadone Scn, Ur: NOT DETECTED
Opiate, Ur Screen: NOT DETECTED
Phencyclidine (PCP) Ur S: NOT DETECTED
Tricyclic, Ur Screen: NOT DETECTED

## 2022-02-28 LAB — CBC
HCT: 40.3 % (ref 39.0–52.0)
Hemoglobin: 13.5 g/dL (ref 13.0–17.0)
MCH: 29.5 pg (ref 26.0–34.0)
MCHC: 33.5 g/dL (ref 30.0–36.0)
MCV: 88 fL (ref 80.0–100.0)
Platelets: 331 10*3/uL (ref 150–400)
RBC: 4.58 MIL/uL (ref 4.22–5.81)
RDW: 13.1 % (ref 11.5–15.5)
WBC: 7.3 10*3/uL (ref 4.0–10.5)
nRBC: 0 % (ref 0.0–0.2)

## 2022-02-28 LAB — ACETAMINOPHEN LEVEL: Acetaminophen (Tylenol), Serum: <10 ug/mL — ABNORMAL LOW (ref 10–30)

## 2022-02-28 LAB — SALICYLATE LEVEL: Salicylate Lvl: <7 mg/dL — ABNORMAL LOW (ref 7.0–30.0)

## 2022-02-28 LAB — ETHANOL: Alcohol, Ethyl (B): <10 mg/dL (ref <10)

## 2022-02-28 MED ORDER — QUETIAPINE FUMARATE ER 200 MG PO TB24: 400.0000 mg | ORAL_TABLET | Freq: Every day | ORAL | Status: DC

## 2022-02-28 MED ORDER — DIVALPROEX SODIUM ER 250 MG PO TB24: 500.0000 mg | ORAL_TABLET | Freq: Three times a day (TID) | ORAL | Status: DC

## 2022-02-28 MED ORDER — QUETIAPINE FUMARATE 200 MG PO TABS: 200.0000 mg | ORAL_TABLET | Freq: Every day | ORAL | Status: DC

## 2022-02-28 MED ORDER — NICOTINE 21 MG/24HR TD PT24: 21.0000 mg | MEDICATED_PATCH | Freq: Every day | TRANSDERMAL | Status: DC

## 2022-02-28 NOTE — ED Provider Notes (Signed)
Patient states that he is feeling much better and like to be discharged.  Plans to follow-up with outpatient clinic.  Not felt to meet criteria for IVC per inpatient psych.  No further recommendations.  Patient is medically cleared.   Merlyn Lot, MD 02/28/22 [phone]

## 2022-02-28 NOTE — ED Notes (Signed)
Pt walking out at this time.

## 2022-02-28 NOTE — BH Assessment (Signed)
Comprehensive Clinical Assessment (CCA) Screening, Triage and Referral Note  02/28/2022 CARTEZ TRITZ [id number]  Chief Complaint:  Chief Complaint  Patient presents with   Psychiatric Evaluation   Visit Diagnosis: Schizoaffective disorder, bipolar type   Kenneth Jensen 32 year old male who presents to the ER due to hearing voices and believing he need inpatient treatment. Patient positive for cocaine and cannabis. Patient is well known to the ER for similar presentation and most times return to baseline within twenty-four hours.  Patient Reported Information How did you hear about [place]? Self  What Is the Reason for Your Visit/Call Today? Patient having hallucinations and feeling like he needs to be admitted.  How Long Has This Been Causing You Problems? 1 wk - 1 month  What Do You Feel Would Help You the Most Today? Treatment for Depression or other mood problem; Alcohol or Drug Use Treatment   Have You Recently Had Any Thoughts About Hurting Yourself? No  Are You Planning to Commit Suicide/Harm Yourself At This time? No   Have you Recently Had Thoughts About [place]? No  Are You Planning to Harm Someone at This Time? No  Explanation: No data recorded  Have You Used Any Alcohol or Drugs in the Past 24 Hours? Yes  How Long Ago Did You Use Drugs or Alcohol? No data recorded What Did You Use and How Much? Cocaine & Cannabis   Do You Currently Have a Therapist/Psychiatrist? Yes  Name of Therapist/Psychiatrist: ACT Team   Have You Been Recently Discharged From Any Office Practice or Programs? No  Explanation of Discharge From Practice/Program: No data recorded   CCA Screening Triage Referral Assessment Type of Contact: Face-to-Face  Telemedicine Service Delivery:   Is this Initial or Reassessment?   Date Telepsych consult ordered in [facility]:    Time Telepsych consult ordered in [facility]:    Location of Assessment: [facility] ED  Provider Location: [facility]  ED    Collateral Involvement: n/a   Does Patient Have a [place]? No data recorded Name and Contact of Legal Guardian: No data recorded If Minor and Not Living with Parent(s), Who has Custody? n/a  Is CPS involved or ever been involved? Never  Is APS involved or ever been involved? Never   Patient Determined To Be At Risk for Harm To Self or Others Based on Review of Patient Reported Information or Presenting Complaint? No  Method: No Plan  Availability of Means: No access or NA  Intent: Vague intent or NA  Notification Required: No need or identified person  Additional Information for Danger to Others Potential: Active psychosis  Additional Comments for Danger to Others Potential: n/a  Are There Guns or Other Weapons in Your Home? No  Types of Guns/Weapons: No data recorded Are These Weapons Safely Secured?                            -- (n/a)  Who Could Verify You Are Able To Have These Secured: No data recorded Do You Have any Outstanding Charges, Pending Court Dates, Parole/Probation? None  Contacted To Inform of Risk of Harm To Self or Others: Other: Comment   Does Patient Present under Involuntary Commitment? No   [place] of Residence: [place]   Patient Currently Receiving the Following Services: ACTT Architect)   Determination of Need: Emergent (2 hours)   Options For Referral: ED Visit   Discharge Disposition:    Brien Few.  Tammi Klippel MS, Deon Pilling, [facility], [facility] Therapeutic Triage Specialist 02/28/2022 5:07 PM

## 2022-02-28 NOTE — ED Notes (Addendum)
Pt stating he wants to leave at this time. This EDT told PT to wait and speak to the doctor. RN and Doctor notifted

## 2022-02-28 NOTE — ED Notes (Signed)
BELONGINGS: Keys Phone Black pants Multi-colored shoes Pink hat Black shirt [employer] sweater Multi-colored jacket 2 lighters Black wallet Gray socks Blue mask  Black under garment

## 2022-02-28 NOTE — Consult Note (Signed)
[place] Psychiatry Consult   Reason for Consult:  cocaine abuse with agitation Referring Physician:  EDP Patient Identification: Kenneth Jensen MRN:  [id number] Principal Diagnosis: Schizoaffective disorder, bipolar type (Kenneth Jensen) Diagnosis:  Principal Problem:   Schizoaffective disorder, bipolar type (Kenneth Jensen) Active Problems:   Cocaine abuse (Kenneth Jensen)   Total Time spent with patient: 45 minutes  Subjective:   Kenneth Jensen is a 32 y.o. male patient admitted with cocaine abuse.  HPI:  Client slightly irritable on assessment, reports "my dad came here when he got shot and they let him die". Eyes glassy, appears intoxicated from cocaine use. Follows commands, endorses they are willing to stay until further stabilized on medication. Requested home psych medications for sleep. Plan to be reevaluated in the morning, client in agreement.  Past Psychiatric History: schizoaffective disorder, bipolar type; cocaine abuse  Risk to Self:  yes Risk to Others:  none Prior Inpatient Therapy:  several Prior Outpatient Therapy:  yes  Past Medical History:  Past Medical History:  Diagnosis Date   Asthma    Bipolar 1 disorder (Kenneth Jensen)    Depression    Schizophrenia (Kenneth Jensen)    Schizotaxia    No past surgical history on file. Family History: No family history on file. Family Psychiatric  History: none Social History:  Social History   Substance and Sexual Activity  Alcohol Use Yes   Comment: social     Social History   Substance and Sexual Activity  Drug Use Yes   Types: Marijuana    Social History   Socioeconomic History   Marital status: Single    Spouse name: Not on file   Number of children: Not on file   Years of education: Not on file   Highest education level: Not on file  Occupational History   Not on file  Tobacco Use   Smoking status: Every Day    Packs/day: 0.50    Types: Cigarettes   Smokeless tobacco: Never  Substance and Sexual Activity   Alcohol use:  Yes    Comment: social   Drug use: Yes    Types: Marijuana   Sexual activity: Not Currently  Other Topics Concern   Not on file  Social History Narrative   ** Merged History Encounter **       Social Determinants of Health   Financial Resource Strain: Not on file  Food Insecurity: Not on file  Transportation Needs: Not on file  Physical Activity: Not on file  Stress: Not on file  Social Connections: Not on file   Additional Social History:    Allergies:   Allergies  Allergen Reactions   Amoxicillin Swelling   Haldol [Haloperidol]     Pt states "I went crazy"   Percocet [Oxycodone-Acetaminophen]    Vicodin [Hydrocodone-Acetaminophen] Itching   Vicodin [Hydrocodone-Acetaminophen]     Labs:  Results for orders placed or performed during the hospital encounter of 02/28/22 (from the past 48 hour(s))  Comprehensive metabolic panel     Status: Abnormal   Collection Time: 02/28/22  2:38 PM  Result Value Ref Range   Sodium 138 135 - 145 mmol/L   Potassium 4.2 3.5 - 5.1 mmol/L   Chloride 98 98 - 111 mmol/L   CO2 26 22 - 32 mmol/L   Glucose, Bld 95 70 - 99 mg/dL    Comment: Glucose reference range applies only to samples taken after fasting for at least 8 hours.   BUN 12 6 - 20 mg/dL   Creatinine,  Ser 0.86 0.61 - 1.24 mg/dL   Calcium 8.8 (L) 8.9 - 10.3 mg/dL   Total Protein 7.5 6.5 - 8.1 g/dL   Albumin 4.0 3.5 - 5.0 g/dL   AST 23 15 - 41 U/L   ALT 13 0 - 44 U/L   Alkaline Phosphatase 73 38 - 126 U/L   Total Bilirubin 0.6 0.3 - 1.2 mg/dL   GFR, Estimated >60 >60 mL/min    Comment: (NOTE) Calculated using the CKD-EPI Creatinine Equation (2021)    Anion gap 14 5 - 15    Comment: Performed at [facility], [address] [place], [place] [address]  Ethanol     Status: None   Collection Time: 02/28/22  2:38 PM  Result Value Ref Range   Alcohol, Ethyl (B) <10 <10 mg/dL    Comment: (NOTE) Lowest detectable limit for serum alcohol is 10 mg/dL.  For medical  purposes only. Performed at [facility], [address] [place], [place] [id number]   Salicylate level     Status: Abnormal   Collection Time: 02/28/22  2:38 PM  Result Value Ref Range   Salicylate Lvl Q000111Q (L) 7.0 - 30.0 mg/dL    Comment: Performed at [facility] [address] [place], [place] [address]  Acetaminophen level     Status: Abnormal   Collection Time: 02/28/22  2:38 PM  Result Value Ref Range   Acetaminophen (Tylenol), Serum <10 (L) 10 - 30 ug/mL    Comment: (NOTE) Therapeutic concentrations vary significantly. A range of 10-30 ug/mL  may be an effective concentration for many patients. However, some  are best treated at concentrations outside of this range. Acetaminophen concentrations >150 ug/mL at 4 hours after ingestion  and >50 ug/mL at 12 hours after ingestion are often associated with  toxic reactions.  Performed at [facility], [address] [place], [place] [address]   cbc     Status: None   Collection Time: 02/28/22  2:38 PM  Result Value Ref Range   WBC 7.3 4.0 - 10.5 K/uL   RBC 4.58 4.22 - 5.81 MIL/uL   Hemoglobin 13.5 13.0 - 17.0 g/dL   HCT 40.3 39.0 - 52.0 %   MCV 88.0 80.0 - 100.0 fL   MCH 29.5 26.0 - 34.0 pg   MCHC 33.5 30.0 - 36.0 g/dL   RDW 13.1 11.5 - 15.5 %   Platelets 331 150 - 400 K/uL   nRBC 0.0 0.0 - 0.2 %    Comment: Performed at [facility], [address], [place], [place] [address]  Urine Drug Screen, Qualitative     Status: Abnormal   Collection Time: 02/28/22  2:38 PM  Result Value Ref Range   Tricyclic, Ur Screen NONE DETECTED NONE DETECTED   Amphetamines, Ur Screen NONE DETECTED NONE DETECTED   MDMA (Ecstasy)Ur Screen NONE DETECTED NONE DETECTED   Cocaine Metabolite,Ur [place] POSITIVE (A) NONE DETECTED   Opiate, Ur Screen NONE DETECTED NONE DETECTED   Phencyclidine (PCP) Ur S NONE DETECTED NONE DETECTED   Cannabinoid 50 Ng, Ur Kenneth Jensen POSITIVE (A) NONE DETECTED   Barbiturates, Ur  Screen NONE DETECTED NONE DETECTED   Benzodiazepine, Ur Scrn NONE DETECTED NONE DETECTED   Methadone Scn, Ur NONE DETECTED NONE DETECTED    Comment: (NOTE) Tricyclics + metabolites, urine    Cutoff 1000 ng/mL Amphetamines + metabolites, urine  Cutoff 1000 ng/mL MDMA (Ecstasy), urine  Cutoff 500 ng/mL Cocaine Metabolite, urine          Cutoff 300 ng/mL Opiate + metabolites, urine        Cutoff 300 ng/mL Phencyclidine (PCP), urine         Cutoff 25 ng/mL Cannabinoid, urine                 Cutoff 50 ng/mL Barbiturates + metabolites, urine  Cutoff 200 ng/mL Benzodiazepine, urine              Cutoff 200 ng/mL Methadone, urine                   Cutoff 300 ng/mL  The urine drug screen provides only a preliminary, unconfirmed analytical test result and should not be used for non-medical purposes. Clinical consideration and professional judgment should be applied to any positive drug screen result due to possible interfering substances. A more specific alternate chemical method must be used in order to obtain a confirmed analytical result. Gas chromatography / mass spectrometry (GC/MS) is the preferred confirm atory method. Performed at [facility], [address] [place], [place] [address]     Current Facility-Administered Medications  Medication Dose Route Frequency Provider Last Rate Last Admin   divalproex (DEPAKOTE ER) 24 hr tablet 500 mg  500 mg Oral TID Patrecia Pour, NP       QUEtiapine (SEROQUEL XR) 24 hr tablet 400 mg  400 mg Oral Daily Patrecia Pour, NP       QUEtiapine (SEROQUEL) tablet 200 mg  200 mg Oral QHS Patrecia Pour, NP       Current Outpatient Medications  Medication Sig Dispense Refill   divalproex (DEPAKOTE ER) 500 MG 24 hr tablet Take 1 tablet (500 mg total) by mouth 3 (three) times daily. 90 tablet 1   paliperidone (INVEGA SUSTENNA) 234 MG/1.5ML injection Inject 234 mg into the muscle every 28 (twenty-eight) days. 1.8 mL 1    QUEtiapine (SEROQUEL XR) 400 MG 24 hr tablet Take 1 tablet by mouth daily. (Patient not taking: Reported on 11/21/2021)     QUEtiapine (SEROQUEL) 200 MG tablet Take 200 mg by mouth at bedtime.      Musculoskeletal: Strength & Muscle Tone: within normal limits Gait & Station: normal Patient leans: N/A  Psychiatric Specialty Exam: Physical Exam Vitals and nursing note reviewed.  Constitutional:      Appearance: Normal appearance.  HENT:     Head: Normocephalic.     Nose: Nose normal.  Pulmonary:     Effort: Pulmonary effort is normal.  Musculoskeletal:        General: Normal range of motion.     Cervical back: Normal range of motion.  Neurological:     General: No focal deficit present.     Mental Status: He is alert and oriented to person, place, and time.  Psychiatric:        Attention and Perception: He is inattentive.        Mood and Affect: Mood is anxious.        Speech: Speech normal.        Behavior: Behavior normal. Behavior is cooperative.        Thought Content: Thought content normal.        Cognition and Memory: Cognition is impaired.        Judgment: Judgment normal.     Review of Systems  Psychiatric/Behavioral:  Positive for substance abuse. The patient is nervous/anxious.   All  other systems reviewed and are negative.   Blood pressure 139/87, pulse 81, temperature 97.8 F (36.6 C), temperature source Oral, resp. rate 18, height '5\' 6"'$  (1.676 m), weight 89 kg, SpO2 96 %.Body mass index is 31.67 kg/m.  General Appearance: Disheveled and Fairly Groomed  Eye Contact:  Fair  Speech:  Slow  Volume:  Normal  Mood:  Anxious and Irritable  Affect:  Blunt  Thought Process:  Linear  Orientation:  Full (Time, Place, and Person)  Thought Content:  Paranoid Ideation and Rumination  Suicidal Thoughts:  No  Homicidal Thoughts:  No  Memory:  Recent;   Fair  Judgement:  Impaired  Insight:  Shallow  Psychomotor Activity:  Negative and Decreased  Concentration:   Concentration: Fair and Attention Span: Fair  Recall:  [employer] of Knowledge:  Fair  Language:  Fair  Akathisia:  No  Handed:  Right  AIMS (if indicated):     Assets:  Desire for Improvement Financial Resources/Insurance Resilience Social Support  ADL's:  Impaired  Cognition:  Impaired,  Mild  Sleep:        Physical Exam: Physical Exam Vitals and nursing note reviewed.  Constitutional:      Appearance: Normal appearance.  HENT:     Head: Normocephalic.     Nose: Nose normal.  Pulmonary:     Effort: Pulmonary effort is normal.  Musculoskeletal:        General: Normal range of motion.     Cervical back: Normal range of motion.  Neurological:     General: No focal deficit present.     Mental Status: He is alert and oriented to person, place, and time.  Psychiatric:        Attention and Perception: He is inattentive.        Mood and Affect: Mood is anxious.        Speech: Speech normal.        Behavior: Behavior normal. Behavior is cooperative.        Thought Content: Thought content normal.        Cognition and Memory: Cognition is impaired.        Judgment: Judgment normal.    Review of Systems  Psychiatric/Behavioral:  Positive for substance abuse. The patient is nervous/anxious.   All other systems reviewed and are negative.  Blood pressure 139/87, pulse 81, temperature 97.8 F (36.6 C), temperature source Oral, resp. rate 18, height '5\' 6"'$  (1.676 m), weight 89 kg, SpO2 96 %. Body mass index is 31.67 kg/m.  Treatment Plan Summary: Daily contact with patient to assess and evaluate symptoms and progress in treatment, Medication management, and Plan : Schizoaffective disorder, bipolar type: Seroquel 400 mg daily and 200 mg at bedtime Invega monthly injection Depakote 500 mg TID  Disposition: Reevaluate in the morning after medications restarted and cocaine metabolizes  Waylan Boga, NP 02/28/2022 4:52 PM

## 2022-02-28 NOTE — ED Provider Notes (Addendum)
[facility] Provider Note    Event Date/Time   First MD Initiated Contact with Patient 02/28/22 1501     (approximate)   History   Psychiatric Evaluation   HPI  Kenneth Jensen is a 32 y.o. male history of schizophrenia as well as many home stressors and substance abuse presents to the ER for evaluation of hallucinations and feeling like he needs to be admitted to the psychiatric hospital.  Denies any active SI or HI.  Patient somewhat withdrawn would not provide any additional history at this time.  Denies any pain or discomfort.     Physical Exam   Triage Vital Signs: ED Triage Vitals  Enc Vitals Group     BP 02/28/22 1427 139/87     Pulse Rate 02/28/22 1427 81     Resp 02/28/22 1427 18     Temp 02/28/22 1427 97.8 F (36.6 C)     Temp Source 02/28/22 1427 Oral     SpO2 02/28/22 1427 96 %     Weight 02/28/22 1425 196 lb 3.4 oz (89 kg)     Height 02/28/22 1425 '5\' 6"'$  (1.676 m)     Head Circumference --      Peak Flow --      Pain Score 02/28/22 1425 0     Pain Loc --      Pain Edu? --      Excl. in [place]? --     Most recent vital signs: Vitals:   02/28/22 1427  BP: 139/87  Pulse: 81  Resp: 18  Temp: 97.8 F (36.6 C)  SpO2: 96%     Constitutional: Alert  Eyes: Conjunctivae are normal.  Head: Atraumatic. Nose: No congestion/rhinnorhea. Mouth/Throat: Mucous membranes are moist.   Neck: Painless ROM.  Cardiovascular:   Good peripheral circulation. Respiratory: Normal respiratory effort.  No retractions.  Gastrointestinal: Soft and nontender.  Musculoskeletal:  no deformity Neurologic:  MAE spontaneously. No gross focal neurologic deficits are appreciated.  Skin:  Skin is warm, dry and intact. No rash noted. Psychiatric: somewhat odd, calm and cooperative, no si or hi    ED Results / Procedures / Treatments   Labs (all labs ordered are listed, but only abnormal results are displayed) Labs Reviewed  COMPREHENSIVE METABOLIC  PANEL - Abnormal; Notable for the following components:      Result Value   Calcium 8.8 (*)    All other components within normal limits  SALICYLATE LEVEL - Abnormal; Notable for the following components:   Salicylate Lvl Q000111Q (*)    All other components within normal limits  ACETAMINOPHEN LEVEL - Abnormal; Notable for the following components:   Acetaminophen (Tylenol), Serum <10 (*)    All other components within normal limits  URINE DRUG SCREEN, QUALITATIVE ([facility] ONLY) - Abnormal; Notable for the following components:   Cocaine Metabolite,Ur [place] POSITIVE (*)    Cannabinoid 50 Ng, Ur [place] POSITIVE (*)    All other components within normal limits  ETHANOL  CBC     EKG     RADIOLOGY Please see ED Course for my review and interpretation.  I personally reviewed all radiographic images ordered to evaluate for the above acute complaints and reviewed radiology reports and findings.  These findings were personally discussed with the patient.  Please see medical record for radiology report.    PROCEDURES:  Critical Care performed:   Procedures   MEDICATIONS ORDERED IN ED: Medications  divalproex (DEPAKOTE ER) 24 hr tablet 500  mg (has no administration in time range)  QUEtiapine (SEROQUEL XR) 24 hr tablet 400 mg (has no administration in time range)  QUEtiapine (SEROQUEL) tablet 200 mg (has no administration in time range)     IMPRESSION / MDM / ASSESSMENT AND PLAN / ED COURSE  I reviewed the triage vital signs and the nursing notes.                              Differential diagnosis includes, but is not limited to, Psychosis, delirium, medication effect, noncompliance, polysubstance abuse, Si, Hi, depression  Patient here for evaluation of schizophrenia and hallucinations..  Patient has psych history of schizophrenia as well as polysubstance abuse.  Laboratory testing was ordered to evaluation for underlying electrolyte derangement or signs of underlying organic pathology  to explain today's presentation.  He is here voluntarily and does not meet criteria for IVC at this time.  Will consult psych.  The patient has been placed in psychiatric observation due to the need to provide a safe environment for the patient while obtaining psychiatric consultation and evaluation, as well as ongoing medical and medication management to treat the patient's condition.  Psych is recommending observation.  Not recommending IVC.  Patient will be kept here voluntarily.      FINAL CLINICAL IMPRESSION(S) / ED DIAGNOSES   Final diagnoses:  Schizophrenia, unspecified type ([place])     Rx / DC Orders   ED Discharge Orders     None        Note:  This document was prepared using Dragon voice recognition software and may include unintentional dictation errors.    Merlyn Lot, MD 02/28/22 1638    Merlyn Lot, MD 02/28/22 [phone]

## 2022-02-28 NOTE — ED Notes (Signed)
Pt requesting to leave and be provided with a cab voucher. Provider Larence Penning him to leave but he cannot have a cab voucher. Pt is okay with that. Pt belongings returned to him. Charge nurse Aldona Bar made aware as well.

## 2022-02-28 NOTE — ED Notes (Signed)
Hospital meal provided, pt tolerated w/o complaints.  Waste discarded appropriately.  

## 2022-02-28 NOTE — ED Triage Notes (Signed)
Pt presents to the ED due to a psych evaluation. Pt states "I'm so stressed out that if someone wanted to fight, I wouldn't even fight back bro, I just lay there". Pt denies auditory and visual hallucinations at this time. Pt continues to state "I just need to be in the psych ward for a couple days". Pt denies SI/HI at this moment.

## 2022-03-05 ENCOUNTER — Telehealth: Payer: Self-pay

## 2022-03-05 NOTE — Telephone Encounter (Signed)
        Patient  visited [facility] on 02/28/2022  for Psychiatric Evaluation.   Telephone encounter attempt :  1st  No answer/busy unable to leave message.   [place] Resource Care Guide   [email or web address]  ?? [id number]   Website: [email or web address]  [email or web address]

## 2022-03-07 ENCOUNTER — Telehealth: Payer: Self-pay

## 2022-03-07 NOTE — Telephone Encounter (Signed)
        Patient  visited [facility] on 02/28/2022  for Psychiatric Evaluation.   Telephone encounter attempt :  2nd  No answer/busy unable to leave message.   [place] Resource Care Guide   ??[email or web address]  ?? [id number]   Website: [email or web address]

## 2022-03-10 ENCOUNTER — Telehealth: Payer: Self-pay

## 2022-03-10 NOTE — Telephone Encounter (Signed)
     Patient  visit on 02/28/2022  at [facility] was for Psychiatric Evaluation. Patient declined to participate.  Have you been able to follow up with your primary care physician?  The patient was or was not able to obtain any needed medicine or equipment.  Are there diet recommendations that you are having difficulty following?  Patient expresses understanding of discharge instructions and education provided has no other needs at this time.    [place] Resource Care Guide   ??[email or web address]  ?? [phone]   Website: [email or web address]  [email or web address]

## 2022-03-16 ENCOUNTER — Other Ambulatory Visit: Payer: Self-pay

## 2022-03-16 ENCOUNTER — Emergency Department: Payer: Medicare HMO

## 2022-03-16 ENCOUNTER — Emergency Department
Admission: EM | Admit: 2022-03-16 | Discharge: 2022-03-16 | Disposition: A | Discharge status: Home / Self Care | Payer: Medicare HMO | Attending: Emergency Medicine | Admitting: Emergency Medicine

## 2022-03-16 DIAGNOSIS — S0990XA Unspecified injury of head, initial encounter: Secondary | ICD-10-CM | POA: Diagnosis not present

## 2022-03-16 DIAGNOSIS — S20219A Contusion of unspecified front wall of thorax, initial encounter: Secondary | ICD-10-CM | POA: Insufficient documentation

## 2022-03-16 DIAGNOSIS — Z0471 Encounter for examination and observation following alleged adult physical abuse: Secondary | ICD-10-CM | POA: Diagnosis not present

## 2022-03-16 DIAGNOSIS — S199XXA Unspecified injury of neck, initial encounter: Secondary | ICD-10-CM | POA: Diagnosis not present

## 2022-03-16 DIAGNOSIS — R079 Chest pain, unspecified: Secondary | ICD-10-CM | POA: Diagnosis not present

## 2022-03-16 DIAGNOSIS — R0789 Other chest pain: Secondary | ICD-10-CM | POA: Diagnosis not present
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
## 2022-03-16 NOTE — ED Notes (Signed)
Patient walked up to nurse's station stating that he wants to leave. He was advised that we recommend staying for results from his scans but he is free to leave on his own will and at his own risk. Patient was informed that leaving before results could lead to a worsening situation including death. Patient then stated that he wanted to use a phone to call his aunt, patient informed that he may use his own cell phone (which he was holding in his hand) in the lobby,

## 2022-03-30 ENCOUNTER — Other Ambulatory Visit: Payer: Self-pay

## 2022-03-30 ENCOUNTER — Emergency Department
Admission: EM | Admit: 2022-03-30 | Discharge: 2022-03-30 | Disposition: A | Discharge status: Home / Self Care | Payer: Medicare HMO | Attending: Emergency Medicine | Admitting: Emergency Medicine

## 2022-03-30 DIAGNOSIS — J45909 Unspecified asthma, uncomplicated: Secondary | ICD-10-CM | POA: Insufficient documentation

## 2022-03-30 DIAGNOSIS — F250 Schizoaffective disorder, bipolar type: Secondary | ICD-10-CM | POA: Insufficient documentation

## 2022-03-30 DIAGNOSIS — F17210 Nicotine dependence, cigarettes, uncomplicated: Secondary | ICD-10-CM | POA: Diagnosis not present

## 2022-03-30 DIAGNOSIS — F1410 Cocaine abuse, uncomplicated: Secondary | ICD-10-CM | POA: Diagnosis present

## 2022-03-30 DIAGNOSIS — F209 Schizophrenia, unspecified: Secondary | ICD-10-CM | POA: Diagnosis not present

## 2022-03-30 DIAGNOSIS — Y900 Blood alcohol level of less than 20 mg/100 ml: Secondary | ICD-10-CM | POA: Diagnosis not present

## 2022-03-30 DIAGNOSIS — F17200 Nicotine dependence, unspecified, uncomplicated: Secondary | ICD-10-CM | POA: Diagnosis present

## 2022-03-30 LAB — COMPREHENSIVE METABOLIC PANEL
ALT: 13 U/L (ref 0–44)
AST: 22 U/L (ref 15–41)
Albumin: 4.1 g/dL (ref 3.5–5.0)
Alkaline Phosphatase: 69 U/L (ref 38–126)
Anion gap: 7 (ref 5–15)
BUN: 7 mg/dL (ref 6–20)
CO2: 27 mmol/L (ref 22–32)
Calcium: 8.8 mg/dL — ABNORMAL LOW (ref 8.9–10.3)
Chloride: 104 mmol/L (ref 98–111)
Creatinine, Ser: 0.75 mg/dL (ref 0.61–1.24)
GFR, Estimated: >60 mL/min (ref >60)
Glucose, Bld: 97 mg/dL (ref 70–99)
Potassium: 3.4 mmol/L — ABNORMAL LOW (ref 3.5–5.1)
Sodium: 138 mmol/L (ref 135–145)
Total Bilirubin: 0.5 mg/dL (ref 0.3–1.2)
Total Protein: 7.4 g/dL (ref 6.5–8.1)

## 2022-03-30 LAB — VALPROIC ACID LEVEL: Valproic Acid Lvl: 70 ug/mL (ref 50.0–100.0)

## 2022-03-30 LAB — ACETAMINOPHEN LEVEL: Acetaminophen (Tylenol), Serum: <10 ug/mL — ABNORMAL LOW (ref 10–30)

## 2022-03-30 LAB — URINE DRUG SCREEN, QUALITATIVE (ARMC ONLY)
Amphetamines, Ur Screen: NOT DETECTED
Barbiturates, Ur Screen: NOT DETECTED
Benzodiazepine, Ur Scrn: NOT DETECTED
Cannabinoid 50 Ng, Ur ~~LOC~~: POSITIVE — AB
Cocaine Metabolite,Ur ~~LOC~~: NOT DETECTED
MDMA (Ecstasy)Ur Screen: NOT DETECTED
Methadone Scn, Ur: NOT DETECTED
Opiate, Ur Screen: NOT DETECTED
Phencyclidine (PCP) Ur S: NOT DETECTED
Tricyclic, Ur Screen: NOT DETECTED

## 2022-03-30 LAB — CBC
HCT: 40.7 % (ref 39.0–52.0)
Hemoglobin: 14 g/dL (ref 13.0–17.0)
MCH: 29.6 pg (ref 26.0–34.0)
MCHC: 34.4 g/dL (ref 30.0–36.0)
MCV: 86 fL (ref 80.0–100.0)
Platelets: 300 10*3/uL (ref 150–400)
RBC: 4.73 MIL/uL (ref 4.22–5.81)
RDW: 13 % (ref 11.5–15.5)
WBC: 10.9 10*3/uL — ABNORMAL HIGH (ref 4.0–10.5)
nRBC: 0 % (ref 0.0–0.2)

## 2022-03-30 LAB — SALICYLATE LEVEL: Salicylate Lvl: <7 mg/dL — ABNORMAL LOW (ref 7.0–30.0)

## 2022-03-30 LAB — ETHANOL: Alcohol, Ethyl (B): <10 mg/dL (ref <10)

## 2022-03-30 MED ORDER — DIVALPROEX SODIUM 500 MG PO DR TAB
500.0000 mg | DELAYED_RELEASE_TABLET | Freq: Three times a day (TID) | ORAL | Status: DC
  Administered 2022-03-30: 500 mg via ORAL
  Filled 2022-03-30: qty 1

## 2022-03-30 MED ORDER — QUETIAPINE FUMARATE 200 MG PO TABS
400.0000 mg | ORAL_TABLET | Freq: Every day | ORAL | Status: DC
  Filled 2022-03-30: qty 2

## 2022-03-30 MED ORDER — QUETIAPINE FUMARATE 200 MG PO TABS: 200.0000 mg | ORAL_TABLET | Freq: Every day | ORAL | Status: DC

## 2022-03-30 NOTE — Consult Note (Signed)
[facility] Consult   Reason for Consult:  Psych Evalaution Referring Physician:  Dr. Leonides Schanz Patient Identification: Kenneth Jensen MRN:  [id number] Principal Diagnosis: Schizoaffective disorder, bipolar type ([place]) Diagnosis:  Principal Problem:   Schizoaffective disorder, bipolar type ([place]) Active Problems:   Tobacco use disorder   Cocaine abuse ([place])   Total Time spent with patient: 20 minutes  Subjective:   " I just need some rest"  HPI: Kenneth Jensen, 32 y.o., male patient assessed  by this provider; chart reviewed and consulted with Dr. Leonides Schanz on 03/30/22.  Kenneth Jensen reports feeling like he needs a break and had decided to come to the er.  Per triage note, Pt to ED via [employer] voluntarily. Pt is stating "I am trying to get to another hospital. I am trying to get back to [facility]". Pt in no acute distress in triage.   Pt not making much sense in triage, Pt denies SI at this time.   Patient is well known to the ED and often times discharge after spending the night.  This visit will likely be the same.  Patient is rambling but denies SI/HI/AVH.   Recommendation:  observe overnight and discharge when medically cleared.  Past Psychiatric History: schizoaffective disorder  Risk to Self:   Risk to Others:   Prior Inpatient Therapy:   Prior Outpatient Therapy:    Past Medical History:  Past Medical History:  Diagnosis Date   Asthma    Bipolar 1 disorder ([place])    Depression    Schizophrenia ([place])    Schizotaxia    History reviewed. No pertinent surgical history. Family History: History reviewed. No pertinent family history. Family Psychiatric  History:  Social History:  Social History   Substance and Sexual Activity  Alcohol Use Yes   Comment: social     Social History   Substance and Sexual Activity  Drug Use Yes   Types: Marijuana    Social History   Socioeconomic History   Marital status: Single    Spouse name: Not on file    Number of children: Not on file   Years of education: Not on file   Highest education level: Not on file  Occupational History   Not on file  Tobacco Use   Smoking status: Every Day    Packs/day: .5    Types: Cigarettes   Smokeless tobacco: Never  Substance and Sexual Activity   Alcohol use: Yes    Comment: social   Drug use: Yes    Types: Marijuana   Sexual activity: Not Currently  Other Topics Concern   Not on file  Social History Narrative   ** Merged History Encounter **       Social Determinants of Health   Financial Resource Strain: Not on file  Food Insecurity: Not on file  Transportation Needs: Not on file  Physical Activity: Not on file  Stress: Not on file  Social Connections: Not on file   Additional Social History:    Allergies:   Allergies  Allergen Reactions   Amoxicillin Swelling   Haldol [Haloperidol]     Pt states "I went crazy"   Percocet [Oxycodone-Acetaminophen]    Vicodin [Hydrocodone-Acetaminophen] Itching   Vicodin [Hydrocodone-Acetaminophen]     Labs:  Results for orders placed or performed during the hospital encounter of 03/30/22 (from the past 48 hour(s))  Acetaminophen level     Status: Abnormal   Collection Time: 03/30/22  2:13 AM  Result Value Ref Range  Acetaminophen (Tylenol), Serum <10 (L) 10 - 30 ug/mL    Comment: (NOTE) Therapeutic concentrations vary significantly. A range of 10-30 ug/mL  may be an effective concentration for many patients. However, some  are best treated at concentrations outside of this range. Acetaminophen concentrations >150 ug/mL at 4 hours after ingestion  and >50 ug/mL at 12 hours after ingestion are often associated with  toxic reactions.  Performed at [facility], [address] [place], [place] [address]   CBC     Status: Abnormal   Collection Time: 03/30/22  2:13 AM  Result Value Ref Range   WBC 10.9 (H) 4.0 - 10.5 K/uL   RBC 4.73 4.22 - 5.81 MIL/uL   Hemoglobin 14.0 13.0 -  17.0 g/dL   HCT 40.7 39.0 - 52.0 %   MCV 86.0 80.0 - 100.0 fL   MCH 29.6 26.0 - 34.0 pg   MCHC 34.4 30.0 - 36.0 g/dL   RDW 13.0 11.5 - 15.5 %   Platelets 300 150 - 400 K/uL   nRBC 0.0 0.0 - 0.2 %    Comment: Performed at [facility], [address] [place], [place] [address]  Comprehensive metabolic panel     Status: Abnormal   Collection Time: 03/30/22  2:13 AM  Result Value Ref Range   Sodium 138 135 - 145 mmol/L   Potassium 3.4 (L) 3.5 - 5.1 mmol/L   Chloride 104 98 - 111 mmol/L   CO2 27 22 - 32 mmol/L   Glucose, Bld 97 70 - 99 mg/dL    Comment: Glucose reference range applies only to samples taken after fasting for at least 8 hours.   BUN 7 6 - 20 mg/dL   Creatinine, Ser 0.75 0.61 - 1.24 mg/dL   Calcium 8.8 (L) 8.9 - 10.3 mg/dL   Total Protein 7.4 6.5 - 8.1 g/dL   Albumin 4.1 3.5 - 5.0 g/dL   AST 22 15 - 41 U/L   ALT 13 0 - 44 U/L   Alkaline Phosphatase 69 38 - 126 U/L   Total Bilirubin 0.5 0.3 - 1.2 mg/dL   GFR, Estimated >60 >60 mL/min    Comment: (NOTE) Calculated using the CKD-EPI Creatinine Equation (2021)    Anion gap 7 5 - 15    Comment: Performed at [facility], [address] [place], [place] [address]  Ethanol     Status: None   Collection Time: 03/30/22  2:13 AM  Result Value Ref Range   Alcohol, Ethyl (B) <10 <10 mg/dL    Comment: (NOTE) Lowest detectable limit for serum alcohol is 10 mg/dL.  For medical purposes only. Performed at [facility], [address] [place], [place] [address]   Valproic acid level     Status: None   Collection Time: 03/30/22  2:13 AM  Result Value Ref Range   Valproic Acid Lvl 70 50.0 - 100.0 ug/mL    Comment: Performed at [facility], [address] [place], [place] [id number]  Salicylate level     Status: Abnormal   Collection Time: 03/30/22  2:13 AM  Result Value Ref Range   Salicylate Lvl Q000111Q (L) 7.0 - 30.0 mg/dL    Comment: Performed at [facility], [address] [place], [place] [address]  Urine Drug Screen, Qualitative     Status: Abnormal   Collection Time: 03/30/22  2:15 AM  Result Value Ref Range   Tricyclic, Ur Screen NONE DETECTED NONE DETECTED  Amphetamines, Ur Screen NONE DETECTED NONE DETECTED   MDMA (Ecstasy)Ur Screen NONE DETECTED NONE DETECTED   Cocaine Metabolite,Ur [place] NONE DETECTED NONE DETECTED   Opiate, Ur Screen NONE DETECTED NONE DETECTED   Phencyclidine (PCP) Ur S NONE DETECTED NONE DETECTED   Cannabinoid 50 Ng, Ur [place] POSITIVE (A) NONE DETECTED   Barbiturates, Ur Screen NONE DETECTED NONE DETECTED   Benzodiazepine, Ur Scrn NONE DETECTED NONE DETECTED   Methadone Scn, Ur NONE DETECTED NONE DETECTED    Comment: (NOTE) Tricyclics + metabolites, urine    Cutoff 1000 ng/mL Amphetamines + metabolites, urine  Cutoff 1000 ng/mL MDMA (Ecstasy), urine              Cutoff 500 ng/mL Cocaine Metabolite, urine          Cutoff 300 ng/mL Opiate + metabolites, urine        Cutoff 300 ng/mL Phencyclidine (PCP), urine         Cutoff 25 ng/mL Cannabinoid, urine                 Cutoff 50 ng/mL Barbiturates + metabolites, urine  Cutoff 200 ng/mL Benzodiazepine, urine              Cutoff 200 ng/mL Methadone, urine                   Cutoff 300 ng/mL  The urine drug screen provides only a preliminary, unconfirmed analytical test result and should not be used for non-medical purposes. Clinical consideration and professional judgment should be applied to any positive drug screen result due to possible interfering substances. A more specific alternate chemical method must be used in order to obtain a confirmed analytical result. Gas chromatography / mass spectrometry (GC/MS) is the preferred confirm atory method. Performed at [facility], [address] [place], [place] [address]     No current facility-administered medications for this encounter.   Current Outpatient Medications  Medication Sig Dispense Refill    divalproex (DEPAKOTE ER) 500 MG 24 hr tablet Take 1 tablet (500 mg total) by mouth 3 (three) times daily. 90 tablet 1   paliperidone (INVEGA SUSTENNA) 234 MG/1.5ML injection Inject 234 mg into the muscle every 28 (twenty-eight) days. 1.8 mL 1   QUEtiapine (SEROQUEL XR) 400 MG 24 hr tablet Take 1 tablet by mouth daily.     QUEtiapine (SEROQUEL) 200 MG tablet Take 200 mg by mouth at bedtime.      Musculoskeletal: Strength & Muscle Tone: within normal limits Gait & Station: normal Patient leans: N/A  Psychiatric Specialty Exam:  Presentation  General Appearance:  Appropriate for Environment  Eye Contact: Minimal  Speech: Slow; Clear and Coherent  Speech Volume: Normal  Handedness: Right   Mood and Affect  Mood: Hopeless  Affect: Congruent   Thought Process  Thought Processes: Coherent  Descriptions of Associations:Intact  Orientation:Full (Time, Place and Person)  Thought Content:Logical  History of Schizophrenia/Schizoaffective disorder:Yes  Duration of Psychotic Symptoms:Greater than six months  Hallucinations:No data recorded Ideas of Reference:None  Suicidal Thoughts:No data recorded Homicidal Thoughts:No data recorded  Sensorium  Memory: Immediate Fair; Recent Fair; Remote Fair  Judgment: Fair  Insight: Fair   Community education officer  Concentration: Fair  Attention Span: Fair  Recall: [employer] of Knowledge: Fair  Language: Fair   Psychomotor Activity  Psychomotor Activity:No data recorded  Assets  Assets: Communication Skills; Desire for Improvement; Physical Health; Resilience; Social Support; Housing   Sleep  Sleep:No data recorded  Physical  Exam: Physical Exam Vitals and nursing note reviewed.    Review of Systems  Psychiatric/Behavioral:  Negative for hallucinations, memory loss and suicidal ideas.    Blood pressure (!) 129/98, pulse 80, temperature 99 F (37.2 C), temperature source Oral, resp. rate 16,  height 5\' 6"  (1.676 m), weight 89 kg, SpO2 98 %. Body mass index is 31.67 kg/m.  Treatment Plan Summary: Discharge in the AM  Disposition: No evidence of imminent risk to self or others at present.   Patient does not meet criteria for psychiatric inpatient admission. Supportive therapy provided about ongoing stressors.  Deloria Lair, NP 03/30/2022 3:22 AM

## 2022-03-30 NOTE — ED Provider Notes (Addendum)
[facility] Provider Note    Event Date/Time   First MD Initiated Contact with Patient 03/30/22 0158     (approximate)   History   Psychiatric Evaluation   HPI  Kenneth Jensen is a 32 y.o. male with history of schizophrenia, asthma who presents to the emergency department requesting evaluation by psychiatry.  It is unclear exactly why patient is here.  He is rambling and most of what he is saying does not make any sense.  He denies SI, HI or hallucinations.  He denies any recent drug use but states he does use marijuana and cocaine occasionally.   History provided by patient.    Past Medical History:  Diagnosis Date   Asthma    Bipolar 1 disorder ([place])    Depression    Schizophrenia ([place])    Schizotaxia     History reviewed. No pertinent surgical history.  MEDICATIONS:  Prior to Admission medications   Medication Sig Start Date End Date Taking? Authorizing Provider  divalproex (DEPAKOTE ER) 500 MG 24 hr tablet Take 1 tablet (500 mg total) by mouth 3 (three) times daily. 05/09/21   Clapacs, Madie Reno, MD  paliperidone (INVEGA SUSTENNA) 234 MG/1.5ML injection Inject 234 mg into the muscle every 28 (twenty-eight) days. 06/04/21   Clapacs, Madie Reno, MD  QUEtiapine (SEROQUEL XR) 400 MG 24 hr tablet Take 1 tablet by mouth daily. 08/15/21   [provider]  QUEtiapine (SEROQUEL) 200 MG tablet Take 200 mg by mouth at bedtime. 11/12/21   [provider]    Physical Exam   Triage Vital Signs: ED Triage Vitals  Enc Vitals Group     BP 03/30/22 0141 (!) 129/98     Pulse Rate 03/30/22 0141 80     Resp 03/30/22 0141 16     Temp 03/30/22 0141 99 F (37.2 C)     Temp Source 03/30/22 0141 Oral     SpO2 03/30/22 0141 98 %     Weight 03/30/22 0139 196 lb 3.4 oz (89 kg)     Height 03/30/22 0139 5\' 6"  (1.676 m)     Head Circumference --      Peak Flow --      Pain Score 03/30/22 0139 0     Pain Loc --      Pain Edu? --      Excl. in [place]?  --     Most recent vital signs: Vitals:   03/30/22 0141  BP: (!) 129/98  Pulse: 80  Resp: 16  Temp: 99 F (37.2 C)  SpO2: 98%    CONSTITUTIONAL: Alert, responds appropriately to questions. Well-appearing; well-nourished HEAD: Normocephalic, atraumatic EYES: Conjunctivae clear, pupils appear equal, sclera nonicteric ENT: normal nose; moist mucous membranes NECK: Supple, normal ROM CARD: RRR; S1 and S2 appreciated RESP: Normal chest excursion without splinting or tachypnea; breath sounds clear and equal bilaterally; no wheezes, no rhonchi, no rales, no hypoxia or respiratory distress, speaking full sentences ABD/GI: Non-distended; soft, non-tender, no rebound, no guarding, no peritoneal signs BACK: The back appears normal EXT: Normal ROM in all joints; no deformity noted, no edema SKIN: Normal color for age and race; warm; no rash on exposed skin NEURO: Moves all extremities equally, normal speech PSYCH: Patient is calm and cooperative.  Does not appear to be responding to internal stimuli.  He is rambling and most of his thought process is nonsensical.  He does not have any rapid or pressured speech.  He denies SI,  HI.   ED Results / Procedures / Treatments   LABS: (all labs ordered are listed, but only abnormal results are displayed) Labs Reviewed  URINE DRUG SCREEN, QUALITATIVE ([place]) - Abnormal; Notable for the following components:      Result Value   Cannabinoid 50 Ng, Ur [place] POSITIVE (*)    All other components within normal limits  ACETAMINOPHEN LEVEL - Abnormal; Notable for the following components:   Acetaminophen (Tylenol), Serum <10 (*)    All other components within normal limits  CBC - Abnormal; Notable for the following components:   WBC 10.9 (*)    All other components within normal limits  COMPREHENSIVE METABOLIC PANEL - Abnormal; Notable for the following components:   Potassium 3.4 (*)    Calcium 8.8 (*)    All other components within normal limits   SALICYLATE LEVEL - Abnormal; Notable for the following components:   Salicylate Lvl [id number] (*)    All other components within normal limits  ETHANOL  VALPROIC ACID LEVEL     EKG:   RADIOLOGY: My personal review and interpretation of imaging:    I have personally reviewed all radiology reports.   No results found.   PROCEDURES:  Critical Care performed: No      Procedures    IMPRESSION / MDM / ASSESSMENT AND PLAN / ED COURSE  I reviewed the triage vital signs and the nursing notes.    Patient here requesting psychiatric evaluation.    DIFFERENTIAL DIAGNOSIS (includes but not limited to):   Schizophrenia, psychosis, drug abuse, malingering   Patient's presentation is most consistent with acute presentation with potential threat to life or bodily function.   PLAN: Will obtain screening labs, urine.  Will consult psychiatry and TTS.  Patient calm and cooperative at this time and wants to be here.  Will keep him voluntary.   MEDICATIONS GIVEN IN ED: Medications  divalproex (DEPAKOTE) DR tablet 500 mg (has no administration in time range)  QUEtiapine (SEROQUEL) tablet 200 mg (has no administration in time range)  QUEtiapine (SEROQUEL) tablet 400 mg (has no administration in time range)     ED COURSE: Patient evaluated by TTS.  They report that patient appears to be at his baseline and that he is often here for respite.  They will reevaluate him in the morning.  Appreciate their help.   Labs show no significant abnormality.  Normal hemoglobin, electrolytes, negative alcohol level, negative Tylenol and salicylate levels.  Drug screen positive for cannabinoids.  Depakote level is therapeutic.  Patient medically cleared.  CONSULTS: TTS and psychiatry consulted for further evaluation and disposition.   OUTSIDE RECORDS REVIEWED: Reviewed multiple previous psychiatric notes.  Last med recs in Feb 2024: Seroquel 400 mg daily and 200 mg at bedtime Invega monthly  injection Depakote 500 mg TID     FINAL CLINICAL IMPRESSION(S) / ED DIAGNOSES   Final diagnoses:  Schizophrenia, unspecified type ([place])     Rx / DC Orders   ED Discharge Orders     None        Note:  This document was prepared using Dragon voice recognition software and may include unintentional dictation errors.   Sorina Derrig, Delice Bison, DO 03/30/22 0330    Cristhian Vanhook, Delice Bison, DO 03/30/22 [phone]

## 2022-03-30 NOTE — ED Triage Notes (Addendum)
Pt to ED via BPD voluntarily. Pt is stating "I am trying to get to another hospital. I am trying to get back to [facility]". Pt in no acute distress in triage.   Pt not making much sense in triage, Pt denies SI at this time.

## 2022-03-30 NOTE — ED Notes (Addendum)
RN and tech attempted twice to obtain blood. Unsuccessful. Will call lab for draw once in room.

## 2022-03-30 NOTE — Consult Note (Signed)
[facility] Psychiatry Consult   Reason for Consult:  Psych Evalaution Referring Physician:  Dr. Leonides Schanz Patient Identification: Kenneth Jensen MRN:  [id number] Principal Diagnosis: Schizoaffective disorder, bipolar type ([place]) Diagnosis:  Principal Problem:   Schizoaffective disorder, bipolar type ([place]) Active Problems:   Cocaine abuse ([place])   Tobacco use disorder   Total Time spent with patient: 20 minutes  Subjective:   The client denies suicidal/homicidal ideations, hallucinations, and alcohol abuse.  He does use cannabis regularly.  Voices he does not like his ACT team. He was cleared to discharge this morning.  Heartily eating his breakfast in his bed on assessment with no distress besides some irritability at times.    HPI on admission per Anette Riedel, PMHNP: [name], 32 y.o., male patient assessed  by this provider; chart reviewed and consulted with Dr. Leonides Schanz on 03/30/22.  Kenneth Jensen reports feeling like he needs a break and had decided to come to the er.  Per triage note, Pt to ED via BPD voluntarily. Pt is stating "I am trying to get to another hospital. I am trying to get back to [facility]". Pt in no acute distress in triage.   Pt not making much sense in triage, Pt denies SI at this time.   Patient is well known to the ED and often times discharge after spending the night.  This visit will likely be the same.  Patient is rambling but denies SI/HI/AVH.   Recommendation:  observe overnight and discharge when medically cleared.  Past Psychiatric History: schizoaffective disorder  Risk to Self:  none Risk to Others:  none Prior Inpatient Therapy:  several Prior Outpatient Therapy:  ACT team  Past Medical History:  Past Medical History:  Diagnosis Date   Asthma    Bipolar 1 disorder ([place])    Depression    Schizophrenia ([place])    Schizotaxia    History reviewed. No pertinent surgical history. Family History: History reviewed. No pertinent  family history. Family Psychiatric  History: none Social History:  Social History   Substance and Sexual Activity  Alcohol Use Yes   Comment: social     Social History   Substance and Sexual Activity  Drug Use Yes   Types: Marijuana    Social History   Socioeconomic History   Marital status: Single    Spouse name: Not on file   Number of children: Not on file   Years of education: Not on file   Highest education level: Not on file  Occupational History   Not on file  Tobacco Use   Smoking status: Every Day    Packs/day: .5    Types: Cigarettes   Smokeless tobacco: Never  Substance and Sexual Activity   Alcohol use: Yes    Comment: social   Drug use: Yes    Types: Marijuana   Sexual activity: Not Currently  Other Topics Concern   Not on file  Social History Narrative   ** Merged History Encounter **       Social Determinants of Health   Financial Resource Strain: Not on file  Food Insecurity: Not on file  Transportation Needs: Not on file  Physical Activity: Not on file  Stress: Not on file  Social Connections: Not on file   Additional Social History:    Allergies:   Allergies  Allergen Reactions   Amoxicillin Swelling   Haldol [Haloperidol]     Pt states "I went crazy"   Percocet [Oxycodone-Acetaminophen]  Vicodin [Hydrocodone-Acetaminophen] Itching   Vicodin [Hydrocodone-Acetaminophen]     Labs:  Results for orders placed or performed during the hospital encounter of 03/30/22 (from the past 48 hour(s))  Acetaminophen level     Status: Abnormal   Collection Time: 03/30/22  2:13 AM  Result Value Ref Range   Acetaminophen (Tylenol), Serum <10 (L) 10 - 30 ug/mL    Comment: (NOTE) Therapeutic concentrations vary significantly. A range of 10-30 ug/mL  may be an effective concentration for many patients. However, some  are best treated at concentrations outside of this range. Acetaminophen concentrations >150 ug/mL at 4 hours after ingestion   and >50 ug/mL at 12 hours after ingestion are often associated with  toxic reactions.  Performed at [facility], [address] [place], [place] [address]   CBC     Status: Abnormal   Collection Time: 03/30/22  2:13 AM  Result Value Ref Range   WBC 10.9 (H) 4.0 - 10.5 K/uL   RBC 4.73 4.22 - 5.81 MIL/uL   Hemoglobin 14.0 13.0 - 17.0 g/dL   HCT 40.7 39.0 - 52.0 %   MCV 86.0 80.0 - 100.0 fL   MCH 29.6 26.0 - 34.0 pg   MCHC 34.4 30.0 - 36.0 g/dL   RDW 13.0 11.5 - 15.5 %   Platelets 300 150 - 400 K/uL   nRBC 0.0 0.0 - 0.2 %    Comment: Performed at [facility], [address] [place], [place] [address]  Comprehensive metabolic panel     Status: Abnormal   Collection Time: 03/30/22  2:13 AM  Result Value Ref Range   Sodium 138 135 - 145 mmol/L   Potassium 3.4 (L) 3.5 - 5.1 mmol/L   Chloride 104 98 - 111 mmol/L   CO2 27 22 - 32 mmol/L   Glucose, Bld 97 70 - 99 mg/dL    Comment: Glucose reference range applies only to samples taken after fasting for at least 8 hours.   BUN 7 6 - 20 mg/dL   Creatinine, Ser 0.75 0.61 - 1.24 mg/dL   Calcium 8.8 (L) 8.9 - 10.3 mg/dL   Total Protein 7.4 6.5 - 8.1 g/dL   Albumin 4.1 3.5 - 5.0 g/dL   AST 22 15 - 41 U/L   ALT 13 0 - 44 U/L   Alkaline Phosphatase 69 38 - 126 U/L   Total Bilirubin 0.5 0.3 - 1.2 mg/dL   GFR, Estimated >60 >60 mL/min    Comment: (NOTE) Calculated using the CKD-EPI Creatinine Equation (2021)    Anion gap 7 5 - 15    Comment: Performed at [facility], [address] [place], [place] [address]  Ethanol     Status: None   Collection Time: 03/30/22  2:13 AM  Result Value Ref Range   Alcohol, Ethyl (B) <10 <10 mg/dL    Comment: (NOTE) Lowest detectable limit for serum alcohol is 10 mg/dL.  For medical purposes only. Performed at [facility], [address] [place], [place] [address]   Valproic acid level     Status: None   Collection Time: 03/30/22  2:13 AM  Result  Value Ref Range   Valproic Acid Lvl 70 50.0 - 100.0 ug/mL    Comment: Performed at [facility], [place], [place] [id number]  Salicylate level     Status: Abnormal   Collection Time: 03/30/22  2:13 AM  Result Value Ref Range   Salicylate Lvl Q000111Q (L)  7.0 - 30.0 mg/dL    Comment: Performed at [facility], [address] [place], [place] [address]  Urine Drug Screen, Qualitative     Status: Abnormal   Collection Time: 03/30/22  2:15 AM  Result Value Ref Range   Tricyclic, Ur Screen NONE DETECTED NONE DETECTED   Amphetamines, Ur Screen NONE DETECTED NONE DETECTED   MDMA (Ecstasy)Ur Screen NONE DETECTED NONE DETECTED   Cocaine Metabolite,Ur [place] NONE DETECTED NONE DETECTED   Opiate, Ur Screen NONE DETECTED NONE DETECTED   Phencyclidine (PCP) Ur S NONE DETECTED NONE DETECTED   Cannabinoid 50 Ng, Ur [place] POSITIVE (A) NONE DETECTED   Barbiturates, Ur Screen NONE DETECTED NONE DETECTED   Benzodiazepine, Ur Scrn NONE DETECTED NONE DETECTED   Methadone Scn, Ur NONE DETECTED NONE DETECTED    Comment: (NOTE) Tricyclics + metabolites, urine    Cutoff 1000 ng/mL Amphetamines + metabolites, urine  Cutoff 1000 ng/mL MDMA (Ecstasy), urine              Cutoff 500 ng/mL Cocaine Metabolite, urine          Cutoff 300 ng/mL Opiate + metabolites, urine        Cutoff 300 ng/mL Phencyclidine (PCP), urine         Cutoff 25 ng/mL Cannabinoid, urine                 Cutoff 50 ng/mL Barbiturates + metabolites, urine  Cutoff 200 ng/mL Benzodiazepine, urine              Cutoff 200 ng/mL Methadone, urine                   Cutoff 300 ng/mL  The urine drug screen provides only a preliminary, unconfirmed analytical test result and should not be used for non-medical purposes. Clinical consideration and professional judgment should be applied to any positive drug screen result due to possible interfering substances. A more specific alternate chemical method must be used in order to  obtain a confirmed analytical result. Gas chromatography / mass spectrometry (GC/MS) is the preferred confirm atory method. Performed at [facility], [address] [place], [place] [address]     Current Facility-Administered Medications  Medication Dose Route Frequency Provider Last Rate Last Admin   divalproex (DEPAKOTE) DR tablet 500 mg  500 mg Oral Q8H Ward, Kristen N, DO   500 mg at 03/30/22 0549   QUEtiapine (SEROQUEL) tablet 200 mg  200 mg Oral QHS Ward, Kristen N, DO       QUEtiapine (SEROQUEL) tablet 400 mg  400 mg Oral Daily Ward, Kristen N, DO       Current Outpatient Medications  Medication Sig Dispense Refill   divalproex (DEPAKOTE ER) 500 MG 24 hr tablet Take 1 tablet (500 mg total) by mouth 3 (three) times daily. 90 tablet 1   paliperidone (INVEGA SUSTENNA) 234 MG/1.5ML injection Inject 234 mg into the muscle every 28 (twenty-eight) days. 1.8 mL 1   QUEtiapine (SEROQUEL XR) 400 MG 24 hr tablet Take 1 tablet by mouth daily.     QUEtiapine (SEROQUEL) 200 MG tablet Take 200 mg by mouth at bedtime.      Musculoskeletal: Strength & Muscle Tone: within normal limits Gait & Station: normal Patient leans: N/A  Psychiatric Specialty Exam: Physical Exam Vitals and nursing note reviewed.  Psychiatric:        Attention and Perception: Attention and perception normal.        Mood and Affect:  Mood is anxious.        Speech: Speech normal.        Behavior: Behavior normal. Behavior is cooperative.        Thought Content: Thought content normal.        Cognition and Memory: Cognition and memory normal.        Judgment: Judgment normal.     Review of Systems  Psychiatric/Behavioral:  Negative for hallucinations, memory loss and suicidal ideas. The patient is nervous/anxious.   All other systems reviewed and are negative.   Blood pressure (!) 129/98, pulse 80, temperature 97.9 F (36.6 C), temperature source Oral, resp. rate 18, height 5\' 6"  (1.676 m), weight 89  kg, SpO2 98 %.Body mass index is 31.67 kg/m.  General Appearance: Casual  Eye Contact:  Good  Speech:  Normal Rate  Volume:  Normal  Mood:  Anxious and Irritable  Affect:  Blunt  Thought Process:  Coherent  Orientation:  Full (Time, Place, and Person)  Thought Content:  WDL and Logical  Suicidal Thoughts:  No  Homicidal Thoughts:  No  Memory:  Immediate;   Fair Recent;   Fair Remote;   Fair  Judgement:  Fair  Insight:  Fair  Psychomotor Activity:  Normal  Concentration:  Concentration: Fair and Attention Span: Fair  Recall:  [employer] of Knowledge:  Fair  Language:  Good  Akathisia:  No  Handed:  Right  AIMS (if indicated):     Assets:  Housing Leisure Time Physical Health Resilience Social Support  ADL's:  Intact  Cognition:  WNL  Sleep:        Physical Exam: Physical Exam Vitals and nursing note reviewed.  Psychiatric:        Attention and Perception: Attention and perception normal.        Mood and Affect: Mood is anxious.        Speech: Speech normal.        Behavior: Behavior normal. Behavior is cooperative.        Thought Content: Thought content normal.        Cognition and Memory: Cognition and memory normal.        Judgment: Judgment normal.    Review of Systems  Psychiatric/Behavioral:  Negative for hallucinations, memory loss and suicidal ideas. The patient is nervous/anxious.   All other systems reviewed and are negative.  Blood pressure (!) 129/98, pulse 80, temperature 99 F (37.2 C), temperature source Oral, resp. rate 16, height 5\' 6"  (1.676 m), weight 89 kg, SpO2 98 %. Body mass index is 31.67 kg/m.  Treatment Plan Summary: Discharge in the AM Schizoaffective disorder, bipolar type:  Follow up with [facility]  Disposition: No evidence of imminent risk to self or others at present.   Patient does not meet criteria for psychiatric inpatient admission. Supportive therapy provided about ongoing stressors.  Waylan Boga, NP 03/30/2022 10:00  AM

## 2022-03-30 NOTE — Discharge Instructions (Signed)
You were cleared by psychiatry.  Return to the ER for worsening symptoms or any other concerns.

## 2022-03-30 NOTE — ED Notes (Addendum)
Pt dressed out : Black coat Pink shirt Grey tshirt Blue jeans 1 belt White shoes 1 book bag 1 character underwear White socks 1 set of keys

## 2022-03-30 NOTE — ED Notes (Signed)
Pt given boxed lunch and sprite, and warm blanket. Pt denies other needs at this time. Pt given TV remote

## 2022-03-30 NOTE — ED Notes (Signed)
VOL/pending reassessment in the AM 

## 2022-03-30 NOTE — BH Assessment (Signed)
Comprehensive Clinical Assessment (CCA) Screening, Triage and Referral Note  03/30/2022 Kenneth Jensen [id number] Recommendations for Services/Supports/Treatments: Consulted with [name], NP, who recommended pt. be observed overnight and reassessed in the morning.   Kenneth Jensen is a 32 year old, English speaking, Black male with a hx of schizoaffective disorder. Pt presented to [facility] ED under Vol from home with bizarre behavior. Pt identified his main stressors as feeling unsafe at home and needing to be at peace. When asked why he'd presented to the ER pt. stating, "I am trying to get a new care team; [name] ain't for me. When I call them to vent they tell me they say that they are not trying to talk at this hour." The patient admitted to having a belief that people are stalking him due to thinking that he is a Banker. Pt explained, "They think I'm young boy". Pt endorsed feeling depressed and paranoid. The pt. made fair eye contact. Pt reported that he has decreased sleep and a decreased appetite. Pt did not appear to be responding to internal or external stimuli. Pt's thoughts were somewhat disorganized; however, he was at baseline behavior. Pt had slowed speech/psychomotor activity. Pt presented with a dysphoric mood; affect was blunted. Pt had a neglected appearance. Pt denied current SI/HI/AV/H.    Chief Complaint:  Chief Complaint  Patient presents with   Psychiatric Evaluation   Visit Diagnosis: Schizoaffective disorder, bipolar type [facility])   Patient Reported Information How did you hear about [place]? Self  What Is the Reason for Your Visit/Call Today? Pt to ED via BPD voluntarily. Pt is stating "I am trying to get to another hospital. I am trying to get back to [name]". Pt in no acute distress in triage.      Pt not making much sense in triage, Pt denies SI at this time.  How Long Has This Been Causing You Problems? > than 6 months  What Do You Feel Would Help You the  Most Today? Stress Management; Treatment for Depression or other mood problem   Have You Recently Had Any Thoughts About Hurting Yourself? No  Are You Planning to Commit Suicide/Harm Yourself At This time? No   Have you Recently Had Thoughts About [place]? No  Are You Planning to Harm Someone at This Time? No  Explanation: Pt stated, "I just want peace".   Have You Used Any Alcohol or Drugs in the Past 24 Hours? No  How Long Ago Did You Use Drugs or Alcohol? No data recorded What Did You Use and How Much? Pt denies use.   Do You Currently Have a Therapist/Psychiatrist? Yes  Name of Therapist/Psychiatrist: ACT Team   Have You Been Recently Discharged From Any Office Practice or Programs? No  Explanation of Discharge From Practice/Program: n/a    CCA Screening Triage Referral Assessment Type of Contact: Face-to-Face  Telemedicine Service Delivery:   Is this Initial or Reassessment?   Date Telepsych consult ordered in [facility]:    Time Telepsych consult ordered in [facility]:    Location of Assessment: [facility] ED  Provider Location: [facility] ED    Collateral Involvement: n/a   Does Patient Have a [place]? No data recorded Name and Contact of Legal Guardian: No data recorded If Minor and Not Living with Parent(s), Who has Custody? n/a  Is CPS involved or ever been involved? Never  Is APS involved or ever been involved? Never   Patient Determined To Be At Risk for Harm To  Self or Others Based on Review of Patient Reported Information or Presenting Complaint? No  Method: No Plan  Availability of Means: No access or NA  Intent: Vague intent or NA  Notification Required: No need or identified person  Additional Information for Danger to Others Potential: -- (n/a)  Additional Comments for Danger to Others Potential: n/a  Are There Guns or Other Weapons in Your Home? No  Types of Guns/Weapons: n/a  Are These Weapons Safely Secured?                             -- (n/a)  Who Could Verify You Are Able To Have These Secured: n/a  Do You Have any Outstanding Charges, Pending Court Dates, Parole/Probation? None reported  Contacted To Inform of Risk of Harm To Self or Others: Other: Comment   Does Patient Present under Involuntary Commitment? No    [place] of Residence: [place]   Patient Currently Receiving the Following Services: ACTT [facility])   Determination of Need: Urgent (48 hours)   Options For Referral: ED Visit   Discharge Disposition:     [name], LCAS

## 2022-04-07 ENCOUNTER — Telehealth: Payer: Self-pay | Admitting: *Deleted

## 2022-04-07 NOTE — Telephone Encounter (Signed)
        Patient  visited [facility] 03/30/2022  for treatment   Telephone encounter attempt :  1st  No answer  [place] [phone] [address] , [place] [address] Email : [email or web address]

## 2022-04-09 ENCOUNTER — Telehealth: Payer: Self-pay | Admitting: *Deleted

## 2022-04-10 ENCOUNTER — Encounter: Payer: Self-pay | Admitting: Emergency Medicine

## 2022-04-10 ENCOUNTER — Other Ambulatory Visit: Payer: Self-pay

## 2022-04-10 ENCOUNTER — Emergency Department
Admission: EM | Admit: 2022-04-10 | Discharge: 2022-04-11 | Disposition: A | Discharge status: Home / Self Care | Payer: Medicare HMO | Attending: Emergency Medicine | Admitting: Emergency Medicine

## 2022-04-10 DIAGNOSIS — Z79899 Other long term (current) drug therapy: Secondary | ICD-10-CM | POA: Insufficient documentation

## 2022-04-10 DIAGNOSIS — F1290 Cannabis use, unspecified, uncomplicated: Secondary | ICD-10-CM | POA: Diagnosis not present

## 2022-04-10 DIAGNOSIS — R451 Restlessness and agitation: Secondary | ICD-10-CM | POA: Insufficient documentation

## 2022-04-10 LAB — SALICYLATE LEVEL: Salicylate Lvl: <7 mg/dL — ABNORMAL LOW (ref 7.0–30.0)

## 2022-04-10 LAB — COMPREHENSIVE METABOLIC PANEL
ALT: 15 U/L (ref 0–44)
AST: 26 U/L (ref 15–41)
Albumin: 3.6 g/dL (ref 3.5–5.0)
Alkaline Phosphatase: 65 U/L (ref 38–126)
Anion gap: 10 (ref 5–15)
BUN: 11 mg/dL (ref 6–20)
CO2: 24 mmol/L (ref 22–32)
Calcium: 8.3 mg/dL — ABNORMAL LOW (ref 8.9–10.3)
Chloride: 103 mmol/L (ref 98–111)
Creatinine, Ser: 0.89 mg/dL (ref 0.61–1.24)
GFR, Estimated: >60 mL/min (ref >60)
Glucose, Bld: 103 mg/dL — ABNORMAL HIGH (ref 70–99)
Potassium: 3.9 mmol/L (ref 3.5–5.1)
Sodium: 137 mmol/L (ref 135–145)
Total Bilirubin: 0.4 mg/dL (ref 0.3–1.2)
Total Protein: 6.8 g/dL (ref 6.5–8.1)

## 2022-04-10 LAB — URINE DRUG SCREEN, QUALITATIVE (ARMC ONLY)
Amphetamines, Ur Screen: NOT DETECTED
Barbiturates, Ur Screen: NOT DETECTED
Benzodiazepine, Ur Scrn: NOT DETECTED
Cannabinoid 50 Ng, Ur ~~LOC~~: POSITIVE — AB
Cocaine Metabolite,Ur ~~LOC~~: NOT DETECTED
MDMA (Ecstasy)Ur Screen: NOT DETECTED
Methadone Scn, Ur: NOT DETECTED
Opiate, Ur Screen: NOT DETECTED
Phencyclidine (PCP) Ur S: NOT DETECTED
Tricyclic, Ur Screen: POSITIVE — AB

## 2022-04-10 LAB — ACETAMINOPHEN LEVEL: Acetaminophen (Tylenol), Serum: <10 ug/mL — ABNORMAL LOW (ref 10–30)

## 2022-04-10 LAB — CBC
HCT: 41.1 % (ref 39.0–52.0)
Hemoglobin: 13.6 g/dL (ref 13.0–17.0)
MCH: 28.9 pg (ref 26.0–34.0)
MCHC: 33.1 g/dL (ref 30.0–36.0)
MCV: 87.4 fL (ref 80.0–100.0)
Platelets: 349 10*3/uL (ref 150–400)
RBC: 4.7 MIL/uL (ref 4.22–5.81)
RDW: 13.2 % (ref 11.5–15.5)
WBC: 12.1 10*3/uL — ABNORMAL HIGH (ref 4.0–10.5)
nRBC: 0 % (ref 0.0–0.2)

## 2022-04-10 LAB — ETHANOL: Alcohol, Ethyl (B): <10 mg/dL (ref <10)

## 2022-04-10 NOTE — ED Notes (Signed)
Pt dressed out into burgundy scrubs with this tech and Caryl Pina, Therapist, sports in the rm. Pt belongings consist of: white tennis shoes, a cell phone, a colorful jacket, white socks, blue boxers, khaki pants, a black t-shirt, a lanyard, a black wallet with no money, an orange back pack with "clothing in case they keep me." Pt has three bags of belongings. Pt calm and cooperative while dressing out.
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
## 2022-04-11 NOTE — ED Notes (Signed)
Pt asked for belongings back and states he is ready to leave, pt given belongings.

## 2022-04-11 NOTE — ED Notes (Signed)
Pt given peanut butter and crackers at this time.  
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
## 2022-04-16 ENCOUNTER — Telehealth: Payer: Self-pay

## 2022-04-16 NOTE — Telephone Encounter (Signed)
     Patient  visit on 4/5  at [facility]   Have you been able to follow up with your primary care physician? Yes   The patient was or was not able to obtain any needed medicine or equipment. Yes   Are there diet recommendations that you are having difficulty following? Na   Patient expresses understanding of discharge instructions and education provided has no other needs at this time.  Yes      Kenneth Jensen Pop Health Care Guide, [place] [phone] [address], [place], [place] [address] Phone: [phone] Email: [email or web address]    

## 2022-04-16 NOTE — Telephone Encounter (Signed)
        Patient  visited [facility] on 4/5     Telephone encounter attempt :  1st  A HIPAA compliant voice message was left requesting a return call.  Instructed patient to call back.    Kacelyn Rowzee Pop Health Care Guide, [place] [phone] [address], [place], [place] [address] Phone: [phone] Email: [email or web address]        

## 2022-05-23 ENCOUNTER — Other Ambulatory Visit: Payer: Self-pay

## 2022-05-23 ENCOUNTER — Emergency Department
Admission: EM | Admit: 2022-05-23 | Discharge: 2022-05-23 | Disposition: A | Discharge status: Home / Self Care | Payer: Medicare HMO | Attending: Student in an Organized Health Care Education/Training Program | Admitting: Student in an Organized Health Care Education/Training Program

## 2022-05-23 DIAGNOSIS — F17210 Nicotine dependence, cigarettes, uncomplicated: Secondary | ICD-10-CM | POA: Insufficient documentation

## 2022-05-23 DIAGNOSIS — F250 Schizoaffective disorder, bipolar type: Secondary | ICD-10-CM | POA: Diagnosis not present

## 2022-05-23 DIAGNOSIS — J45909 Unspecified asthma, uncomplicated: Secondary | ICD-10-CM | POA: Insufficient documentation

## 2022-05-23 DIAGNOSIS — F1220 Cannabis dependence, uncomplicated: Secondary | ICD-10-CM | POA: Diagnosis present

## 2022-05-23 DIAGNOSIS — F209 Schizophrenia, unspecified: Secondary | ICD-10-CM

## 2022-05-23 DIAGNOSIS — R45851 Suicidal ideations: Secondary | ICD-10-CM | POA: Diagnosis not present

## 2022-05-23 DIAGNOSIS — F1410 Cocaine abuse, uncomplicated: Secondary | ICD-10-CM | POA: Diagnosis not present

## 2022-05-23 LAB — CBC
HCT: 42.2 % (ref 39.0–52.0)
Hemoglobin: 14 g/dL (ref 13.0–17.0)
MCH: 29.1 pg (ref 26.0–34.0)
MCHC: 33.2 g/dL (ref 30.0–36.0)
MCV: 87.7 fL (ref 80.0–100.0)
Platelets: 374 10*3/uL (ref 150–400)
RBC: 4.81 MIL/uL (ref 4.22–5.81)
RDW: 12.9 % (ref 11.5–15.5)
WBC: 11 10*3/uL — ABNORMAL HIGH (ref 4.0–10.5)
nRBC: 0 % (ref 0.0–0.2)

## 2022-05-23 LAB — COMPREHENSIVE METABOLIC PANEL
ALT: 14 U/L (ref 0–44)
AST: 23 U/L (ref 15–41)
Albumin: 4.6 g/dL (ref 3.5–5.0)
Alkaline Phosphatase: 72 U/L (ref 38–126)
Anion gap: 10 (ref 5–15)
BUN: 10 mg/dL (ref 6–20)
CO2: 25 mmol/L (ref 22–32)
Calcium: 8.8 mg/dL — ABNORMAL LOW (ref 8.9–10.3)
Chloride: 100 mmol/L (ref 98–111)
Creatinine, Ser: 0.79 mg/dL (ref 0.61–1.24)
GFR, Estimated: >60 mL/min (ref >60)
Glucose, Bld: 102 mg/dL — ABNORMAL HIGH (ref 70–99)
Potassium: 3.7 mmol/L (ref 3.5–5.1)
Sodium: 135 mmol/L (ref 135–145)
Total Bilirubin: 0.6 mg/dL (ref 0.3–1.2)
Total Protein: 7.8 g/dL (ref 6.5–8.1)

## 2022-05-23 LAB — ETHANOL: Alcohol, Ethyl (B): <10 mg/dL (ref <10)

## 2022-05-23 LAB — SALICYLATE LEVEL: Salicylate Lvl: <7 mg/dL — ABNORMAL LOW (ref 7.0–30.0)

## 2022-05-23 LAB — ACETAMINOPHEN LEVEL: Acetaminophen (Tylenol), Serum: <10 ug/mL — ABNORMAL LOW (ref 10–30)

## 2022-05-23 MED ORDER — DIVALPROEX SODIUM ER 250 MG PO TB24
500.0000 mg | ORAL_TABLET | Freq: Three times a day (TID) | ORAL | Status: DC
  Administered 2022-05-23: 500 mg via ORAL
  Filled 2022-05-23: qty 2

## 2022-05-23 MED ORDER — LORAZEPAM 1 MG PO TABS
1.0000 mg | ORAL_TABLET | Freq: Once | ORAL | Status: AC
  Administered 2022-05-23: 1 mg via ORAL
  Filled 2022-05-23: qty 1

## 2022-05-23 MED ORDER — QUETIAPINE FUMARATE 200 MG PO TABS: 200.0000 mg | ORAL_TABLET | Freq: Every day | ORAL | Status: DC

## 2022-05-23 NOTE — ED Notes (Signed)
Pt resting. This RN entered room pt woke up. This RN asked pt if he needed anything and pt stated he just wanted to sleep.

## 2022-05-23 NOTE — Consult Note (Signed)
Full note to follow. Patient seen and does not meet commitment criteria and has appropriate outpatient treatment. IVC discontinued. Can be d/ced from ER

## 2022-05-23 NOTE — ED Notes (Signed)
Pt dressed out in triage room 1 with this Clinical research associate and security guard, Thereasa Distance.  Pt dressed out into burgundy/blue colored BH scrubs.  Pt belongings placed into 2 pt belongings bags and labeled appropriately.     Pt belongings: Keys on a key ring [employer] blue touch screen cell phone Black wallet White charging cord with black wall charging block White socks  [employer] and red sweatpants  Black and red sweatshirt  Black and white tennis shoes

## 2022-05-23 NOTE — ED Triage Notes (Signed)
Pt presents to ER with c/o "seeing demons" and hearing voices.  Pt states this has been going on for several months.  Pt has hx of schizophrenia, BPD, and depression.  Pt states he did "a line of coke" tonight, but denies any alcohol or other drug use.  Pt noted to be rambling on about events in his life, but without making any sense of the stories he is telling.  Pt denies any SI but does endorse thoughts of hurting others.  Pt appears intoxicated in triage, but is otherwise alert and in NAD.

## 2022-05-23 NOTE — ED Notes (Signed)
Ivc/pending consult 

## 2022-05-23 NOTE — ED Notes (Signed)
Pt given sprite 

## 2022-05-23 NOTE — ED Notes (Signed)
Pts 2 belonging bags placed in a tote labeled with pts name and today's date within the designated locked unit.

## 2022-05-23 NOTE — BH Assessment (Signed)
Comprehensive Clinical Assessment (CCA) Note  05/23/2022 Kenneth Jensen [id number]  Chief Complaint: Patient is a 32 year old male presenting to [facility] ED under IVC. Per triage note Pt presents to ER with c/o "seeing demons" and hearing voices. Pt states this has been going on for several months. Pt has hx of schizophrenia, BPD, and depression. Pt states he did "a line of coke" tonight, but denies any alcohol or other drug use. Pt noted to be rambling on about events in his life, but without making any sense of the stories he is telling. Pt denies any SI but does endorse thoughts of hurting others. Pt appears intoxicated in triage, but is otherwise alert and in NAD. During assessment patient appears alert and oriented x4, calm and cooperative. Patient reports "I'm hearing voices, they telling me to calm down, I need rest." Patient reports using cocaine today "just a little not a lot." When this writer tried to ask questions patient reports "I really don't want to say because they are going to call my doctor, I know I need a job." Patient reports some delusions "sometimes I sleep at home and there are nights where I see my door wide open but I know I closed it." Patient reports that he is taking his medications as prescribed. Currently unable to report SI/HI but does report AH/VH. Chief Complaint  Patient presents with   Mental Health Problem   Visit Diagnosis: Schizoaffective disorder, bipolar type    CCA Screening, Triage and Referral (STR)  Patient Reported Information How did you hear about [place]? Self  Referral name: No data recorded Referral phone number: No data recorded  Whom do you see for routine medical problems? No data recorded Practice/Facility Name: No data recorded Practice/Facility Phone Number: No data recorded Name of Contact: No data recorded Contact Number: No data recorded Contact Fax Number: No data recorded Prescriber Name: No data recorded Prescriber Address (if  known): No data recorded  What Is the Reason for Your Visit/Call Today? Pt presents to ER with c/o "seeing demons" and hearing voices.  Pt states this has been going on for several months.  Pt has hx of schizophrenia, BPD, and depression.  Pt states he did "a line of coke" tonight, but denies any alcohol or other drug use.  Pt noted to be rambling on about events in his life, but without making any sense of the stories he is telling.  Pt denies any SI but does endorse thoughts of hurting others.  Pt appears intoxicated in triage, but is otherwise alert and in NAD  How Long Has This Been Causing You Problems? > than 6 months  What Do You Feel Would Help You the Most Today? Stress Management; Treatment for Depression or other mood problem   Have You Recently Been in Any Inpatient Treatment (Hospital/Detox/Crisis Center/28-Day Program)? No data recorded Name/Location of Program/Hospital:No data recorded How Long Were You There? No data recorded When Were You Discharged? No data recorded  Have You Ever Received Services From [facility] Before? No data recorded Who Do You See at [facility]? No data recorded  Have You Recently Had Any Thoughts About Hurting Yourself? -- (UTA)  Are You Planning to Commit Suicide/Harm Yourself At This time? -- (UTA)   Have you Recently Had Thoughts About Hurting Someone Else? -- (UTA)  Explanation: Pt stated, "I just want peace".   Have You Used Any Alcohol or Drugs in the Past 24 Hours? No  How Long Ago Did You  Use Drugs or Alcohol? No data recorded What Did You Use and How Much? Pt denies use.   Do You Currently Have a Therapist/Psychiatrist? Yes  Name of Therapist/Psychiatrist: Per history patient has a ACT team   Have You Been Recently Discharged From Any Office Practice or Programs? No  Explanation of Discharge From Practice/Program: n/a     CCA Screening Triage Referral Assessment Type of Contact: Face-to-Face  Is this Initial or  Reassessment? No data recorded Date Telepsych consult ordered in [facility]:  No data recorded Time Telepsych consult ordered in CHL:  No data recorded  Patient Reported Information Reviewed? No data recorded Patient Left Without Being Seen? No data recorded Reason for Not Completing Assessment: Patient was unable to provide coherent information.   Collateral Involvement: n/a   Does Patient Have a Automotive engineer Guardian? No data recorded Name and Contact of Legal Guardian: No data recorded If Minor and Not Living with Parent(s), Who has Custody? n/a  Is CPS involved or ever been involved? Never  Is APS involved or ever been involved? Never   Patient Determined To Be At Risk for Harm To Self or Others Based on Review of Patient Reported Information or Presenting Complaint? No  Method: No Plan  Availability of Means: No access or NA  Intent: Vague intent or NA  Notification Required: No need or identified person  Additional Information for Danger to Others Potential: -- (n/a)  Additional Comments for Danger to Others Potential: n/a  Are There Guns or Other Weapons in Your Home? No  Types of Guns/Weapons: n/a  Are These Weapons Safely Secured?                            -- (n/a)  Who Could Verify You Are Able To Have These Secured: n/a  Do You Have any Outstanding Charges, Pending Court Dates, Parole/Probation? None reported  Contacted To Inform of Risk of Harm To Self or Others: Other: Comment   Location of Assessment: [facility] ED   Does Patient Present under Involuntary Commitment? Yes  IVC Papers Initial File Date: No data recorded  [place] of Residence: [place]   Patient Currently Receiving the Following Services: Medication Management; ACTT (Assertive Community Treatment)   Determination of Need: Emergent (2 hours)   Options For Referral: ED Visit     CCA Biopsychosocial Intake/Chief Complaint:  No data recorded Current Symptoms/Problems: No data  recorded  Patient Reported Schizophrenia/Schizoaffective Diagnosis in Past: Yes   Strengths: Some insight, seeking help, receive treatment services.  Preferences: No data recorded Abilities: No data recorded  Type of Services Patient Feels are Needed: No data recorded  Initial Clinical Notes/Concerns: No data recorded  Mental Health Symptoms Depression:   Tearfulness; Difficulty Concentrating; Fatigue; Hopelessness   Duration of Depressive symptoms:  Greater than two weeks   Mania:   N/A   Anxiety:    N/A   Psychosis:   Delusions; Hallucinations   Duration of Psychotic symptoms:  Greater than six months   Trauma:   N/A   Obsessions:   N/A   Compulsions:   N/A   Inattention:   N/A   Hyperactivity/Impulsivity:   N/A   Oppositional/Defiant Behaviors:   N/A   Emotional Irregularity:   N/A   Other Mood/Personality Symptoms:  No data recorded   Mental Status Exam Appearance and self-care  Stature:   Average   Weight:   Average weight   Clothing:   Disheveled  Grooming:   Neglected   Cosmetic use:   None   Posture/gait:   Normal   Motor activity:   Not Remarkable   Sensorium  Attention:   Distractible   Concentration:   Focuses on irrelevancies   Orientation:   Person; Place; Object   Recall/memory:   Normal   Affect and Mood  Affect:   Depressed   Mood:   Depressed   Relating  Eye contact:   Avoided   Facial expression:   Depressed   Attitude toward examiner:   Cooperative   Thought and Language  Speech flow:  Flight of Ideas   Thought content:   Delusions; Suspicious   Preoccupation:   Ruminations   Hallucinations:   Auditory; Visual   Organization:  No data recorded  [employer] of Knowledge:   Fair   Intelligence:   Average   Abstraction:   Functional   Judgement:   Fair   Dance movement psychotherapist:   Distorted   Insight:   Poor   Decision Making:   Normal   Social  Functioning  Social Maturity:   Impulsive; Responsible   Social Judgement:   Chemical engineer"; Heedless   Stress  Stressors:   Housing; Surveyor, quantity; Transitions   Coping Ability:   Exhausted; Deficient supports   Skill Deficits:   Self-care   Supports:   Friends/Service system     Religion: Religion/Spirituality Are You A Religious Person?: No  Leisure/Recreation: Leisure / Recreation Do You Have Hobbies?: No  Exercise/Diet: Exercise/Diet Do You Exercise?: No Have You Gained or Lost A Significant Amount of Weight in the Past Six Months?: No Do You Follow a Special Diet?: No Do You Have Any Trouble Sleeping?: Yes Explanation of Sleeping Difficulties: Patient reports a lack of sleep   CCA Employment/Education Employment/Work Situation: Employment / Work Situation Employment Situation: On disability Why is Patient on Disability: Schizophrenia How Long has Patient Been on Disability: Unknown Patient's Job has Been Impacted by Current Illness: No Has Patient ever Been in the [employer]?: No  Education: Education Did Theme park manager?: No Did You Have An Individualized Education Program (IIEP): No Did You Have Any Difficulty At School?: No Patient's Education Has Been Impacted by Current Illness: No   CCA Family/Childhood History Family and Relationship History: Family history Marital status: Single Does patient have children?: No  Childhood History:  Childhood History By whom was/is the patient raised?: Mother Did patient suffer any verbal/emotional/physical/sexual abuse as a child?: No Did patient suffer from severe childhood neglect?: No Has patient ever been sexually abused/assaulted/raped as an adolescent or adult?: No Was the patient ever a victim of a crime or a disaster?: No Witnessed domestic violence?: No Has patient been affected by domestic violence as an adult?: No  Child/Adolescent Assessment:     CCA Substance Use Alcohol/Drug  Use: Alcohol / Drug Use Pain Medications: See PTA Prescriptions: See PTA Over the Counter: See PTA History of alcohol / drug use?: Yes Longest period of sobriety (when/how long): Unable to quantify Negative Consequences of Use: Personal relationships Withdrawal Symptoms: None                         ASAM's:  Six Dimensions of Multidimensional Assessment  Dimension 1:  Acute Intoxication and/or Withdrawal Potential:   Dimension 1:  Description of individual's past and current experiences of substance use and withdrawal: Pt has a hx of and current use of cannabis  Dimension 2:  Biomedical Conditions and Complications:   Dimension 2:  Description of patient's biomedical conditions and  complications: None noted  Dimension 3:  Emotional, Behavioral, or Cognitive Conditions and Complications:  Dimension 3:  Description of emotional, behavioral, or cognitive conditions and complications: Schizoaffective disorder, bipolar type  Dimension 4:  Readiness to Change:  Dimension 4:  Description of Readiness to Change criteria: Pt denies recent use  Dimension 5:  Relapse, Continued use, or Continued Problem Potential:  Dimension 5:  Relapse, continued use, or continued problem potential critiera description: Pt identified living environment as a severe stressor.  Dimension 6:  Recovery/Living Environment:  Dimension 6:  Recovery/Iiving environment criteria description: Pt identified living environment as a severe stressor.  ASAM Severity Score: ASAM's Severity Rating Score: 11  ASAM Recommended Level of Treatment: ASAM Recommended Level of Treatment: Level I Outpatient Treatment   Substance use Disorder (SUD) Substance Use Disorder (SUD)  Checklist Symptoms of Substance Use: Continued use despite having a persistent/recurrent physical/psychological problem caused/exacerbated by use, Continued use despite persistent or recurrent social, interpersonal problems, caused or exacerbated by use,  Presence of craving or strong urge to use  Recommendations for Services/Supports/Treatments: Recommendations for Services/Supports/Treatments Recommendations For Services/Supports/Treatments: ACCTT (Assertive Community Treatment)  DSM5 Diagnoses: Patient Active Problem List   Diagnosis Date Noted   Cocaine abuse (HCC) 11/22/2021   Prediabetes 05/16/2020   Schizoaffective disorder, bipolar type (HCC) 03/25/2019   Cannabis use disorder, moderate, dependence (HCC) 04/21/2011   Tobacco use disorder 04/21/2011    Patient Centered Plan: Patient is on the following Treatment Plan(s):  Impulse Control   Referrals to Alternative Service(s): Referred to Alternative Service(s):   Place:   Date:   Time:    Referred to Alternative Service(s):   Place:   Date:   Time:    Referred to Alternative Service(s):   Place:   Date:   Time:    Referred to Alternative Service(s):   Place:   Date:   Time:      @BHCOLLABOFCARE @  [employer], LCAS-A

## 2022-05-23 NOTE — BH Assessment (Signed)
Writer spoke with the patient to complete an updated/reassessment. Patient denies SI/HI and AV/H. Patient was able to provide appropriate answers to the questions.

## 2022-05-23 NOTE — ED Provider Notes (Signed)
-----------------------------------------   11:43 AM on 05/23/2022 -----------------------------------------  Patient has been seen and evaluated by psychiatry.  They believe that the patient is safe for discharge home from a psychiatric standpoint.  Patient's medical workup has been largely nonrevealing with reassuring CBC chemistry negative acetaminophen salicylate alcohol.  We will discharge with outpatient resources.   Minna Antis, MD 05/23/22 1144

## 2022-05-23 NOTE — ED Notes (Signed)
Pt given lunch tray.

## 2022-05-23 NOTE — Discharge Instructions (Addendum)
You have been seen in the emergency department for a  psychiatric concern. You have been evaluated both medically as well as psychiatrically. Please follow-up with your outpatient resources provided. Return to the emergency department for any worsening symptoms, or any thoughts of hurting yourself or anyone else so that we may attempt to help you. 

## 2022-05-23 NOTE — ED Notes (Signed)
Pts tv turned to channel of his choosing and lights turned off per pts request. Pt voiced no further needs at this time.

## 2022-05-23 NOTE — Consult Note (Signed)
[facility] Face-to-Face Psychiatry Consult   Reason for Consult: Consult for 32 year old man who presented to the emergency room with a mention of suicidal thoughts and hallucinations and paranoia. Referring Physician: Paduchowski Patient Identification: Kenneth Jensen MRN:  [id number] Principal Diagnosis: Schizoaffective disorder, bipolar type (HCC) Diagnosis:  Principal Problem:   Schizoaffective disorder, bipolar type (HCC) Active Problems:   Cannabis use disorder, moderate, dependence (HCC)   Cocaine abuse (HCC)   Total Time spent with patient: 30 minutes  Subjective:   Kenneth Jensen is a 32 y.o. male patient admitted with "I am good now.  I want to go home.".  HPI: Patient seen and chart reviewed.  Patient very familiar from previous encounters.  32 year old man with schizophrenia.  He says that he was feeling very anxious at home could not sleep and had some paranoid thoughts.  Admits that he transiently mentioned having suicidal thoughts but denies having any intention or plan of acting on that.  Patient says his main problem is that he keeps hanging around bad people who put bad thoughts into his head.  Also admits that he used a little bit of cocaine.  He is now calm down quite a bit and is back to his baseline mental state.  Says that he has chronic hallucinations but has been compliant with his medicine and is working regularly with his ACT team.  No behavior problems here in the hospital.  No indication of acute dangerousness.  Past Psychiatric History: Long history of chronic psychotic disorder with mood instability.  Multiple hospitalizations.  Often calms down very quickly in the hospital.  Risk to Self:   Risk to Others:   Prior Inpatient Therapy:   Prior Outpatient Therapy:    Past Medical History:  Past Medical History:  Diagnosis Date   Asthma    Bipolar 1 disorder (HCC)    Depression    Schizophrenia (HCC)    Schizotaxia    History reviewed. No pertinent  surgical history. Family History: History reviewed. No pertinent family history. Family Psychiatric  History: See previous Social History:  Social History   Substance and Sexual Activity  Alcohol Use Yes   Comment: social     Social History   Substance and Sexual Activity  Drug Use Yes   Types: Marijuana    Social History   Socioeconomic History   Marital status: Single    Spouse name: Not on file   Number of children: Not on file   Years of education: Not on file   Highest education level: Not on file  Occupational History   Not on file  Tobacco Use   Smoking status: Every Day    Packs/day: .5    Types: Cigarettes   Smokeless tobacco: Never  Substance and Sexual Activity   Alcohol use: Yes    Comment: social   Drug use: Yes    Types: Marijuana   Sexual activity: Not Currently  Other Topics Concern   Not on file  Social History Narrative   ** Merged History Encounter **       Social Determinants of Health   Financial Resource Strain: Not on file  Food Insecurity: Not on file  Transportation Needs: Not on file  Physical Activity: Not on file  Stress: Not on file  Social Connections: Not on file   Additional Social History:    Allergies:   Allergies  Allergen Reactions   Amoxicillin Swelling   Haldol [Haloperidol]     Pt states "I went  crazy"   Percocet [Oxycodone-Acetaminophen]    Vicodin [Hydrocodone-Acetaminophen] Itching   Vicodin [Hydrocodone-Acetaminophen]     Labs:  Results for orders placed or performed during the hospital encounter of 05/23/22 (from the past 48 hour(s))  Comprehensive metabolic panel     Status: Abnormal   Collection Time: 05/23/22  2:19 AM  Result Value Ref Range   Sodium 135 135 - 145 mmol/L   Potassium 3.7 3.5 - 5.1 mmol/L   Chloride 100 98 - 111 mmol/L   CO2 25 22 - 32 mmol/L   Glucose, Bld 102 (H) 70 - 99 mg/dL    Comment: Glucose reference range applies only to samples taken after fasting for at least 8 hours.    BUN 10 6 - 20 mg/dL   Creatinine, Ser 1.61 0.61 - 1.24 mg/dL   Calcium 8.8 (L) 8.9 - 10.3 mg/dL   Total Protein 7.8 6.5 - 8.1 g/dL   Albumin 4.6 3.5 - 5.0 g/dL   AST 23 15 - 41 U/L   ALT 14 0 - 44 U/L   Alkaline Phosphatase 72 38 - 126 U/L   Total Bilirubin 0.6 0.3 - 1.2 mg/dL   GFR, Estimated >09 >60 mL/min    Comment: (NOTE) Calculated using the CKD-EPI Creatinine Equation (2021)    Anion gap 10 5 - 15    Comment: Performed at [facility], [address] [place], [place] [address]  Ethanol     Status: None   Collection Time: 05/23/22  2:19 AM  Result Value Ref Range   Alcohol, Ethyl (B) <10 <10 mg/dL    Comment: (NOTE) Lowest detectable limit for serum alcohol is 10 mg/dL.  For medical purposes only. Performed at [facility], [address] [place], [place] [address]   Salicylate level     Status: Abnormal   Collection Time: 05/23/22  2:19 AM  Result Value Ref Range   Salicylate Lvl <7.0 (L) 7.0 - 30.0 mg/dL    Comment: Performed at [facility], [address] [place], [place] [address]  Acetaminophen level     Status: Abnormal   Collection Time: 05/23/22  2:19 AM  Result Value Ref Range   Acetaminophen (Tylenol), Serum <10 (L) 10 - 30 ug/mL    Comment: (NOTE) Therapeutic concentrations vary significantly. A range of 10-30 ug/mL  may be an effective concentration for many patients. However, some  are best treated at concentrations outside of this range. Acetaminophen concentrations >150 ug/mL at 4 hours after ingestion  and >50 ug/mL at 12 hours after ingestion are often associated with  toxic reactions.  Performed at [facility], [address] [place], [place] [address]   cbc     Status: Abnormal   Collection Time: 05/23/22  2:19 AM  Result Value Ref Range   WBC 11.0 (H) 4.0 - 10.5 K/uL   RBC 4.81 4.22 - 5.81 MIL/uL   Hemoglobin 14.0 13.0 - 17.0 g/dL   HCT 08.6 57.8 - 46.9 %   MCV 87.7 80.0 - 100.0 fL   MCH  29.1 26.0 - 34.0 pg   MCHC 33.2 30.0 - 36.0 g/dL   RDW 62.9 52.8 - 41.3 %   Platelets 374 150 - 400 K/uL   nRBC 0.0 0.0 - 0.2 %    Comment: Performed at [facility], [address], [place], [place] [address]    Current Facility-Administered Medications  Medication Dose Route Frequency Provider Last Rate Last Admin   divalproex (DEPAKOTE ER)  24 hr tablet 500 mg  500 mg Oral TID Willy Eddy, MD   500 mg at 05/23/22 1049   QUEtiapine (SEROQUEL) tablet 200 mg  200 mg Oral QHS Willy Eddy, MD       Current Outpatient Medications  Medication Sig Dispense Refill   divalproex (DEPAKOTE ER) 500 MG 24 hr tablet Take 1 tablet (500 mg total) by mouth 3 (three) times daily. 90 tablet 1   paliperidone (INVEGA SUSTENNA) 234 MG/1.5ML injection Inject 234 mg into the muscle every 28 (twenty-eight) days. 1.8 mL 1   QUEtiapine (SEROQUEL) 200 MG tablet Take 200 mg by mouth at bedtime.      Musculoskeletal: Strength & Muscle Tone: within normal limits Gait & Station: normal Patient leans: N/A            Psychiatric Specialty Exam:  Presentation  General Appearance:  Appropriate for Environment  Eye Contact: Minimal  Speech: Slow; Clear and Coherent  Speech Volume: Normal  Handedness: Right   Mood and Affect  Mood: Hopeless  Affect: Congruent   Thought Process  Thought Processes: Coherent  Descriptions of Associations:Intact  Orientation:Full (Time, Place and Person)  Thought Content:Logical  History of Schizophrenia/Schizoaffective disorder:Yes  Duration of Psychotic Symptoms:Greater than six months  Hallucinations:No data recorded Ideas of Reference:None  Suicidal Thoughts:No data recorded Homicidal Thoughts:No data recorded  Sensorium  Memory: Immediate Fair; Recent Fair; Remote Fair  Judgment: Fair  Insight: Fair   Art therapist  Concentration: Fair  Attention Span: Fair  Recall: [employer] of  Knowledge: Fair  Language: Fair   Psychomotor Activity  Psychomotor Activity:No data recorded  Assets  Assets: Communication Skills; Desire for Improvement; Physical Health; Resilience; Social Support; Housing   Sleep  Sleep:No data recorded  Physical Exam: Physical Exam Vitals and nursing note reviewed.  Constitutional:      Appearance: Normal appearance.  HENT:     Head: Normocephalic and atraumatic.     Mouth/Throat:     Pharynx: Oropharynx is clear.  Eyes:     Pupils: Pupils are equal, round, and reactive to light.  Cardiovascular:     Rate and Rhythm: Normal rate and regular rhythm.  Pulmonary:     Effort: Pulmonary effort is normal.     Breath sounds: Normal breath sounds.  Abdominal:     General: Abdomen is flat.     Palpations: Abdomen is soft.  Musculoskeletal:        General: Normal range of motion.  Skin:    General: Skin is warm and dry.  Neurological:     General: No focal deficit present.     Mental Status: He is alert. Mental status is at baseline.  Psychiatric:        Attention and Perception: Attention normal.        Mood and Affect: Mood normal.        Speech: Speech normal.        Behavior: Behavior normal.        Thought Content: Thought content normal.        Cognition and Memory: Cognition normal.        Judgment: Judgment normal.    Review of Systems  Constitutional: Negative.   HENT: Negative.    Eyes: Negative.   Respiratory: Negative.    Cardiovascular: Negative.   Gastrointestinal: Negative.   Musculoskeletal: Negative.   Skin: Negative.   Neurological: Negative.   Psychiatric/Behavioral: Negative.     Blood pressure 124/72, pulse 95, temperature 98.3 F (36.8  C), temperature source Oral, resp. rate 18, SpO2 100 %. There is no height or weight on file to calculate BMI.  Treatment Plan Summary: Medication management and Plan no need to change any medicine.  His ACT team is working with him regularly.  Patient was reminded  about the negative effects of substance use especially cocaine and cannabis.  He agrees with that and says he is going to keep trying to stay away from it.  Right now he appears to be lucid and calm and not acutely dangerous.  Has a safe place to live and a good outpatient plan.  Case reviewed with the ER physician and psychiatry staff.  Discontinue IVC he can be discharged home.  Disposition: Patient does not meet criteria for psychiatric inpatient admission.  Mordecai Rasmussen, MD 05/23/2022 2:41 PM

## 2022-05-23 NOTE — ED Notes (Signed)
Pt states he wants his clothes and is ready to go home. Pt states he came here for sleep and he is ready to go home.

## 2022-05-23 NOTE — ED Notes (Signed)
IVC/pending psych consult 

## 2022-05-23 NOTE — ED Provider Notes (Signed)
[facility] Provider Note    Event Date/Time   First MD Initiated Contact with Patient 05/23/22 [phone]     (approximate)   History   Mental Health Problem  Levl V Caveat:  AMS  HPI  RUSTIN MCGONIGLE is a 32 y.o. male history of schizophrenia as well as polysubstance abuse who presents to the ER voluntarily brought in by police with his behavior as well as suicidal ideation.  Patient quite odd and saying nonsensical things.     Physical Exam   Triage Vital Signs: ED Triage Vitals  Enc Vitals Group     BP 05/23/22 0159 (!) 132/91     Pulse Rate 05/23/22 0159 71     Resp 05/23/22 0159 18     Temp 05/23/22 0159 (!) 97.5 F (36.4 C)     Temp Source 05/23/22 0159 Oral     SpO2 05/23/22 0159 96 %     Weight --      Height --      Head Circumference --      Peak Flow --      Pain Score 05/23/22 0202 0     Pain Loc --      Pain Edu? --      Excl. in GC? --     Most recent vital signs: Vitals:   05/23/22 0159  BP: (!) 132/91  Pulse: 71  Resp: 18  Temp: (!) 97.5 F (36.4 C)  SpO2: 96%     Constitutional: Alert  Eyes: Conjunctivae are normal.  Head: Atraumatic. Nose: No congestion/rhinnorhea. Mouth/Throat: Mucous membranes are moist.   Neck: Painless ROM.  Cardiovascular:   Good peripheral circulation. Respiratory: Normal respiratory effort.  No retractions.  Gastrointestinal: Soft and nontender.  Musculoskeletal:  no deformity Neurologic:  MAE spontaneously. No gross focal neurologic deficits are appreciated.  Skin:  Skin is warm, dry and intact. No rash noted. Psychiatric: Quite odd and bizarre.  Admits to suicidal ideation.    ED Results / Procedures / Treatments   Labs (all labs ordered are listed, but only abnormal results are displayed) Labs Reviewed  COMPREHENSIVE METABOLIC PANEL - Abnormal; Notable for the following components:      Result Value   Glucose, Bld 102 (*)    Calcium 8.8 (*)    All other components  within normal limits  SALICYLATE LEVEL - Abnormal; Notable for the following components:   Salicylate Lvl <7.0 (*)    All other components within normal limits  ACETAMINOPHEN LEVEL - Abnormal; Notable for the following components:   Acetaminophen (Tylenol), Serum <10 (*)    All other components within normal limits  CBC - Abnormal; Notable for the following components:   WBC 11.0 (*)    All other components within normal limits  ETHANOL  URINE DRUG SCREEN, QUALITATIVE ([facility] ONLY)     EKG     RADIOLOGY    PROCEDURES:  Critical Care performed:   Procedures   MEDICATIONS ORDERED IN ED: Medications  divalproex (DEPAKOTE ER) 24 hr tablet 500 mg (has no administration in time range)  QUEtiapine (SEROQUEL) tablet 200 mg (has no administration in time range)  LORazepam (ATIVAN) tablet 1 mg (1 mg Oral Given 05/23/22 0314)     IMPRESSION / MDM / ASSESSMENT AND PLAN / ED COURSE  I reviewed the triage vital signs and the nursing notes.  Differential diagnosis includes, but is not limited to, Psychosis, delirium, medication effect, noncompliance, polysubstance abuse, Si, Hi, depression  Patient here for evaluation of SI.  Patient has psych history of schizophrenia.  Laboratory testing was ordered to evaluation for underlying electrolyte derangement or signs of underlying organic pathology to explain today's presentation.  Likely large component of polysubstance abuse but patient quite disorganized and with reported suicidal ideation will IVC for psychiatric consultation.  Will give Ativan.  Patient agreeable to taking his meds.  Does not appear consistent with infectious etiology.  The patient has been placed in psychiatric observation due to the need to provide a safe environment for the patient while obtaining psychiatric consultation and evaluation, as well as ongoing medical and medication management to treat the patient's condition.  The patient has  been placed under full IVC at this time.       FINAL CLINICAL IMPRESSION(S) / ED DIAGNOSES   Final diagnoses:  Schizophrenia, unspecified type (HCC)  Suicidal ideation     Rx / DC Orders   ED Discharge Orders     None        Note:  This document was prepared using Dragon voice recognition software and may include unintentional dictation errors.    Willy Eddy, MD 05/23/22 [phone]

## 2022-05-23 NOTE — ED Notes (Signed)
Pt given breakfast tray

## 2022-05-29 ENCOUNTER — Telehealth: Payer: Self-pay

## 2022-05-29 NOTE — Telephone Encounter (Signed)
Transition Care Management Unsuccessful Follow-up Telephone Call  Date of discharge and from where:  05/23/2022 [facility]  Attempts:  1st Attempt  Reason for unsuccessful TCM follow-up call:  No answer/busy  [facility]  [facility] Population Health Community Resource Care Guide   [email or web address]  ?? [phone]   Website: [email or web address]  [email or web address]

## 2022-05-30 ENCOUNTER — Telehealth: Payer: Self-pay

## 2022-05-30 DIAGNOSIS — Z5181 Encounter for therapeutic drug level monitoring: Secondary | ICD-10-CM | POA: Diagnosis not present

## 2022-05-30 DIAGNOSIS — F209 Schizophrenia, unspecified: Secondary | ICD-10-CM | POA: Diagnosis not present

## 2022-05-30 NOTE — Telephone Encounter (Signed)
Transition Care Management Follow-up Telephone Call Date of discharge and from where: 05/23/2022 [facility]. Patient declined to participate.  Kenneth Jensen [facility]  [facility] Population Health Community Resource Care Guide   [email or web address]  ?? [phone]   Website: [email or web address]  [email or web address]

## 2022-06-05 ENCOUNTER — Ambulatory Visit (INDEPENDENT_AMBULATORY_CARE_PROVIDER_SITE_OTHER): Payer: Medicare HMO | Admitting: Family

## 2022-06-05 VITALS — BP 124/78 | HR 98 | Ht 68.0 in | Wt 218.0 lb

## 2022-06-05 DIAGNOSIS — F250 Schizoaffective disorder, bipolar type: Secondary | ICD-10-CM

## 2022-06-05 DIAGNOSIS — Z113 Encounter for screening for infections with a predominantly sexual mode of transmission: Secondary | ICD-10-CM | POA: Diagnosis not present

## 2022-06-05 DIAGNOSIS — E559 Vitamin D deficiency, unspecified: Secondary | ICD-10-CM

## 2022-06-05 DIAGNOSIS — R7303 Prediabetes: Secondary | ICD-10-CM | POA: Diagnosis not present

## 2022-06-05 DIAGNOSIS — E538 Deficiency of other specified B group vitamins: Secondary | ICD-10-CM

## 2022-06-05 DIAGNOSIS — Z202 Contact with and (suspected) exposure to infections with a predominantly sexual mode of transmission: Secondary | ICD-10-CM

## 2022-06-05 DIAGNOSIS — Z2089 Contact with and (suspected) exposure to other communicable diseases: Secondary | ICD-10-CM | POA: Diagnosis not present

## 2022-06-05 DIAGNOSIS — F1410 Cocaine abuse, uncomplicated: Secondary | ICD-10-CM | POA: Diagnosis not present

## 2022-06-05 DIAGNOSIS — I10 Essential (primary) hypertension: Secondary | ICD-10-CM

## 2022-06-05 DIAGNOSIS — R300 Dysuria: Secondary | ICD-10-CM | POA: Diagnosis not present

## 2022-06-05 DIAGNOSIS — E782 Mixed hyperlipidemia: Secondary | ICD-10-CM | POA: Diagnosis not present

## 2022-06-05 DIAGNOSIS — F1220 Cannabis dependence, uncomplicated: Secondary | ICD-10-CM | POA: Diagnosis not present

## 2022-06-06 LAB — CMP14+EGFR
ALT: 13 IU/L (ref 0–44)
AST: 19 IU/L (ref 0–40)
Albumin/Globulin Ratio: 1.8 (ref 1.2–2.2)
Albumin: 4.4 g/dL (ref 4.1–5.1)
Alkaline Phosphatase: 84 IU/L (ref 44–121)
BUN/Creatinine Ratio: 11 (ref 9–20)
BUN: 11 mg/dL (ref 6–20)
Bilirubin Total: 0.4 mg/dL (ref 0.0–1.2)
CO2: 22 mmol/L (ref 20–29)
Calcium: 9.4 mg/dL (ref 8.7–10.2)
Chloride: 99 mmol/L (ref 96–106)
Creatinine, Ser: 1.03 mg/dL (ref 0.76–1.27)
Globulin, Total: 2.5 g/dL (ref 1.5–4.5)
Glucose: 110 mg/dL — ABNORMAL HIGH (ref 70–99)
Potassium: 4.1 mmol/L (ref 3.5–5.2)
Sodium: 137 mmol/L (ref 134–144)
Total Protein: 6.9 g/dL (ref 6.0–8.5)
eGFR: 98 mL/min/{1.73_m2} (ref >59)

## 2022-06-06 LAB — RPR W/REFLEX TO TREPSURE: RPR: NONREACTIVE

## 2022-06-06 LAB — CBC WITH DIFFERENTIAL
Basophils Absolute: 0 10*3/uL (ref 0.0–0.2)
Basos: 0 %
EOS (ABSOLUTE): 0.2 10*3/uL (ref 0.0–0.4)
Eos: 2 %
Hematocrit: 42.4 % (ref 37.5–51.0)
Hemoglobin: 14.5 g/dL (ref 13.0–17.7)
Immature Grans (Abs): 0 10*3/uL (ref 0.0–0.1)
Immature Granulocytes: 0 %
Lymphocytes Absolute: 2 10*3/uL (ref 0.7–3.1)
Lymphs: 26 %
MCH: 29.7 pg (ref 26.6–33.0)
MCHC: 34.2 g/dL (ref 31.5–35.7)
MCV: 87 fL (ref 79–97)
Monocytes Absolute: 0.6 10*3/uL (ref 0.1–0.9)
Monocytes: 8 %
Neutrophils Absolute: 5 10*3/uL (ref 1.4–7.0)
Neutrophils: 64 %
RBC: 4.88 x10E6/uL (ref 4.14–5.80)
RDW: 12.8 % (ref 11.6–15.4)
WBC: 7.9 10*3/uL (ref 3.4–10.8)

## 2022-06-06 LAB — LIPID PANEL
Chol/HDL Ratio: 4.3 ratio (ref 0.0–5.0)
Cholesterol, Total: 177 mg/dL (ref 100–199)
HDL: 41 mg/dL (ref >39)
LDL Chol Calc (NIH): 107 mg/dL — ABNORMAL HIGH (ref 0–99)
Triglycerides: 163 mg/dL — ABNORMAL HIGH (ref 0–149)
VLDL Cholesterol Cal: 29 mg/dL (ref 5–40)

## 2022-06-06 LAB — HEMOGLOBIN A1C
Est. average glucose Bld gHb Est-mCnc: 117 mg/dL
Hgb A1c MFr Bld: 5.7 % — ABNORMAL HIGH (ref 4.8–5.6)

## 2022-06-06 LAB — TREPONEMAL ANTIBODIES, TPPA: Treponemal Antibodies, TPPA: NONREACTIVE

## 2022-06-06 LAB — ACUTE VIRAL HEPATITIS (HAV, HBV, HCV)
HCV Ab: NONREACTIVE
Hep A IgM: NEGATIVE
Hep B C IgM: NEGATIVE
Hepatitis B Surface Ag: NEGATIVE

## 2022-06-06 LAB — HCV INTERPRETATION

## 2022-06-06 LAB — VITAMIN D 25 HYDROXY (VIT D DEFICIENCY, FRACTURES): Vit D, 25-Hydroxy: 18 ng/mL — ABNORMAL LOW (ref 30.0–100.0)

## 2022-06-06 LAB — VITAMIN B12: Vitamin B-12: 393 pg/mL (ref 232–1245)

## 2022-06-06 LAB — HIV ANTIBODY (ROUTINE TESTING W REFLEX): HIV Screen 4th Generation wRfx: NONREACTIVE

## 2022-06-06 LAB — HSV 1 AND 2 AB, IGG
HSV 1 Glycoprotein G Ab, IgG: <0.91 index (ref 0.00–0.90)
HSV 2 IgG, Type Spec: <0.91 index (ref 0.00–0.90)

## 2022-06-08 ENCOUNTER — Encounter: Payer: Self-pay | Admitting: Family

## 2022-06-08 DIAGNOSIS — E559 Vitamin D deficiency, unspecified: Secondary | ICD-10-CM | POA: Insufficient documentation

## 2022-06-08 DIAGNOSIS — E782 Mixed hyperlipidemia: Secondary | ICD-10-CM | POA: Insufficient documentation

## 2022-06-08 LAB — CT/NG NAA RFX TV NAA
Chlamydia by NAA: NEGATIVE
Gonococcus by NAA: NEGATIVE

## 2022-06-08 NOTE — Assessment & Plan Note (Signed)
Patient is managed by psychiatry.  He is well maintained on his current meds.  Will defer changes to them.

## 2022-06-08 NOTE — Assessment & Plan Note (Signed)
Checking labs today.  Continue current therapy for lipid control. Will modify as needed based on labwork results.  

## 2022-06-08 NOTE — Assessment & Plan Note (Signed)
Patient educated on foods that contain carbohydrates and the need to decrease intake.  We discussed prediabetes, and what it means and the need for strict dietary control to prevent progression to type 2 diabetes.  Advised to decrease intake of sugary drinks, including sodas, sweet tea, and some juices, and of starch and sugar heavy foods (ie., potatoes, rice, bread, pasta, desserts). He verbalizes understanding and agreement with the changes discussed today.   

## 2022-06-08 NOTE — Assessment & Plan Note (Signed)
Patient is still abusing substances  We have discussed today the importance of stopping use, as this can exacerbate his psychiatric conditions.   Patient verbalizes understanding.

## 2022-06-08 NOTE — Progress Notes (Signed)
Established Patient Office Visit  Subjective:  Patient ID: Kenneth Jensen, male    DOB: May 25, 1990  Age: 32 y.o. MRN: [id number]  Chief Complaint  Patient presents with   Follow-up    Follow up    Patient is here today for STI testing.  He says that overall he feels well and has no major concerns.   Due for labs anyway.     No other concerns at this time.   Past Medical History:  Diagnosis Date   Asthma    Bipolar 1 disorder (HCC)    Depression    Schizophrenia (HCC)    Schizotaxia     No past surgical history on file.  Social History   Socioeconomic History   Marital status: Single    Spouse name: Not on file   Number of children: Not on file   Years of education: Not on file   Highest education level: Not on file  Occupational History   Not on file  Tobacco Use   Smoking status: Every Day    Packs/day: .5    Types: Cigarettes   Smokeless tobacco: Never  Substance and Sexual Activity   Alcohol use: Yes    Comment: social   Drug use: Yes    Types: Marijuana   Sexual activity: Not Currently  Other Topics Concern   Not on file  Social History Narrative   ** Merged History Encounter **       Social Determinants of Health   Financial Resource Strain: Not on file  Food Insecurity: Not on file  Transportation Needs: Not on file  Physical Activity: Not on file  Stress: Not on file  Social Connections: Not on file  Intimate Partner Violence: Not on file    No family history on file.  Allergies  Allergen Reactions   Amoxicillin Swelling   Haldol [Haloperidol]     Pt states "I went crazy"   Percocet [Oxycodone-Acetaminophen]    Vicodin [Hydrocodone-Acetaminophen] Itching   Vicodin [Hydrocodone-Acetaminophen]     Review of Systems  All other systems reviewed and are negative.      Objective:   BP 124/78   Pulse 98   Ht 5\' 8"  (1.727 m)   Wt 218 lb (98.9 kg)   SpO2 97%   BMI 33.15 kg/m   Vitals:   06/05/22 1120  BP: 124/78   Pulse: 98  Height: 5\' 8"  (1.727 m)  Weight: 218 lb (98.9 kg)  SpO2: 97%  BMI (Calculated): 33.15    Physical Exam Vitals and nursing note reviewed.  Constitutional:      Appearance: Normal appearance. He is normal weight.  Eyes:     Pupils: Pupils are equal, round, and reactive to light.  Cardiovascular:     Rate and Rhythm: Normal rate and regular rhythm.     Pulses: Normal pulses.     Heart sounds: Normal heart sounds.  Pulmonary:     Effort: Pulmonary effort is normal.     Breath sounds: Normal breath sounds.  Neurological:     Mental Status: He is alert.  Psychiatric:        Mood and Affect: Mood is anxious. Affect is flat.        Behavior: Behavior normal.        Thought Content: Thought content normal.        Judgment: Judgment is impulsive.      Results for orders placed or performed in visit on 06/05/22  Ct/Ng NAA rfx  Tv NAA   UC  Result Value Ref Range   Chlamydia by NAA Negative Negative   Gonococcus by NAA Negative Negative   Trich vag by NAA Rfx Comment   Treponemal Antibodies, TPPA  Result Value Ref Range   Treponemal Antibodies, TPPA Non Reactive Non Reactive   Interpretation: Comment   HSV 1 and 2 Ab, IgG  Result Value Ref Range   HSV 1 Glycoprotein G Ab, IgG <0.91 0.00 - 0.90 index   HSV 2 IgG, Type Spec <0.91 0.00 - 0.90 index  RPR w/reflex to TrepSure  Result Value Ref Range   RPR Non Reactive Non Reactive  Acute Viral Hepatitis (HAV, HBV, HCV)  Result Value Ref Range   Hep A IgM Negative Negative   Hepatitis B Surface Ag Negative Negative   Hep B C IgM Negative Negative   HCV Ab Non Reactive Non Reactive  HIV Antibody (routine testing w rflx)  Result Value Ref Range   HIV Screen 4th Generation wRfx Non Reactive Non Reactive  Lipid panel  Result Value Ref Range   Cholesterol, Total 177 100 - 199 mg/dL   Triglycerides 161 (H) 0 - 149 mg/dL   HDL 41 >09 mg/dL   VLDL Cholesterol Cal 29 5 - 40 mg/dL   LDL Chol Calc (NIH) 604 (H) 0 - 99  mg/dL   Chol/HDL Ratio 4.3 0.0 - 5.0 ratio  VITAMIN D 25 Hydroxy (Vit-D Deficiency, Fractures)  Result Value Ref Range   Vit D, 25-Hydroxy 18.0 (L) 30.0 - 100.0 ng/mL  CBC With Differential  Result Value Ref Range   WBC 7.9 3.4 - 10.8 x10E3/uL   RBC 4.88 4.14 - 5.80 x10E6/uL   Hemoglobin 14.5 13.0 - 17.7 g/dL   Hematocrit 54.0 98.1 - 51.0 %   MCV 87 79 - 97 fL   MCH 29.7 26.6 - 33.0 pg   MCHC 34.2 31.5 - 35.7 g/dL   RDW 19.1 47.8 - 29.5 %   Neutrophils 64 Not Estab. %   Lymphs 26 Not Estab. %   Monocytes 8 Not Estab. %   Eos 2 Not Estab. %   Basos 0 Not Estab. %   Neutrophils Absolute 5.0 1.4 - 7.0 x10E3/uL   Lymphocytes Absolute 2.0 0.7 - 3.1 x10E3/uL   Monocytes Absolute 0.6 0.1 - 0.9 x10E3/uL   EOS (ABSOLUTE) 0.2 0.0 - 0.4 x10E3/uL   Basophils Absolute 0.0 0.0 - 0.2 x10E3/uL   Immature Granulocytes 0 Not Estab. %   Immature Grans (Abs) 0.0 0.0 - 0.1 x10E3/uL  CMP14+EGFR  Result Value Ref Range   Glucose 110 (H) 70 - 99 mg/dL   BUN 11 6 - 20 mg/dL   Creatinine, Ser 6.21 0.76 - 1.27 mg/dL   eGFR 98 >30 QM/VHQ/4.69   BUN/Creatinine Ratio 11 9 - 20   Sodium 137 134 - 144 mmol/L   Potassium 4.1 3.5 - 5.2 mmol/L   Chloride 99 96 - 106 mmol/L   CO2 22 20 - 29 mmol/L   Calcium 9.4 8.7 - 10.2 mg/dL   Total Protein 6.9 6.0 - 8.5 g/dL   Albumin 4.4 4.1 - 5.1 g/dL   Globulin, Total 2.5 1.5 - 4.5 g/dL   Albumin/Globulin Ratio 1.8 1.2 - 2.2   Bilirubin Total 0.4 0.0 - 1.2 mg/dL   Alkaline Phosphatase 84 44 - 121 IU/L   AST 19 0 - 40 IU/L   ALT 13 0 - 44 IU/L  Hemoglobin A1c  Result Value Ref Range  Hgb A1c MFr Bld 5.7 (H) 4.8 - 5.6 %   Est. average glucose Bld gHb Est-mCnc 117 mg/dL  Vitamin Z61  Result Value Ref Range   Vitamin B-12 393 232 - 1,245 pg/mL  Interpretation:  Result Value Ref Range   HCV Interp 1: Comment     Recent Results (from the past 2160 hour(s))  Acetaminophen level     Status: Abnormal   Collection Time: 03/30/22  2:13 AM  Result Value Ref  Range   Acetaminophen (Tylenol), Serum <10 (L) 10 - 30 ug/mL    Comment: (NOTE) Therapeutic concentrations vary significantly. A range of 10-30 ug/mL  may be an effective concentration for many patients. However, some  are best treated at concentrations outside of this range. Acetaminophen concentrations >150 ug/mL at 4 hours after ingestion  and >50 ug/mL at 12 hours after ingestion are often associated with  toxic reactions.  Performed at [facility], [address] [place], [place] [address]   CBC     Status: Abnormal   Collection Time: 03/30/22  2:13 AM  Result Value Ref Range   WBC 10.9 (H) 4.0 - 10.5 K/uL   RBC 4.73 4.22 - 5.81 MIL/uL   Hemoglobin 14.0 13.0 - 17.0 g/dL   HCT 54.0 98.1 - 19.1 %   MCV 86.0 80.0 - 100.0 fL   MCH 29.6 26.0 - 34.0 pg   MCHC 34.4 30.0 - 36.0 g/dL   RDW 47.8 29.5 - 62.1 %   Platelets 300 150 - 400 K/uL   nRBC 0.0 0.0 - 0.2 %    Comment: Performed at [facility], [address] [place], [place] [address]  Comprehensive metabolic panel     Status: Abnormal   Collection Time: 03/30/22  2:13 AM  Result Value Ref Range   Sodium 138 135 - 145 mmol/L   Potassium 3.4 (L) 3.5 - 5.1 mmol/L   Chloride 104 98 - 111 mmol/L   CO2 27 22 - 32 mmol/L   Glucose, Bld 97 70 - 99 mg/dL    Comment: Glucose reference range applies only to samples taken after fasting for at least 8 hours.   BUN 7 6 - 20 mg/dL   Creatinine, Ser 7.84 0.61 - 1.24 mg/dL   Calcium 8.8 (L) 8.9 - 10.3 mg/dL   Total Protein 7.4 6.5 - 8.1 g/dL   Albumin 4.1 3.5 - 5.0 g/dL   AST 22 15 - 41 U/L   ALT 13 0 - 44 U/L   Alkaline Phosphatase 69 38 - 126 U/L   Total Bilirubin 0.5 0.3 - 1.2 mg/dL   GFR, Estimated >69 >62 mL/min    Comment: (NOTE) Calculated using the CKD-EPI Creatinine Equation (2021)    Anion gap 7 5 - 15    Comment: Performed at [facility], [address] [place], [place] [address]  Ethanol     Status: None   Collection Time: 03/30/22   2:13 AM  Result Value Ref Range   Alcohol, Ethyl (B) <10 <10 mg/dL    Comment: (NOTE) Lowest detectable limit for serum alcohol is 10 mg/dL.  For medical purposes only. Performed at [facility], [address] [place], [place] [address]   Valproic acid level     Status: None   Collection Time: 03/30/22  2:13 AM  Result Value Ref Range   Valproic Acid Lvl 70 50.0 - 100.0 ug/mL    Comment: Performed at [facility], [address]  [place], [place] [address]  Salicylate level     Status: Abnormal   Collection Time: 03/30/22  2:13 AM  Result Value Ref Range   Salicylate Lvl <7.0 (L) 7.0 - 30.0 mg/dL    Comment: Performed at [facility], [address] [place], [place] [address]  Urine Drug Screen, Qualitative     Status: Abnormal   Collection Time: 03/30/22  2:15 AM  Result Value Ref Range   Tricyclic, Ur Screen NONE DETECTED NONE DETECTED   Amphetamines, Ur Screen NONE DETECTED NONE DETECTED   MDMA (Ecstasy)Ur Screen NONE DETECTED NONE DETECTED   Cocaine Metabolite,Ur [place] NONE DETECTED NONE DETECTED   Opiate, Ur Screen NONE DETECTED NONE DETECTED   Phencyclidine (PCP) Ur S NONE DETECTED NONE DETECTED   Cannabinoid 50 Ng, Ur [place] POSITIVE (A) NONE DETECTED   Barbiturates, Ur Screen NONE DETECTED NONE DETECTED   Benzodiazepine, Ur Scrn NONE DETECTED NONE DETECTED   Methadone Scn, Ur NONE DETECTED NONE DETECTED    Comment: (NOTE) Tricyclics + metabolites, urine    Cutoff 1000 ng/mL Amphetamines + metabolites, urine  Cutoff 1000 ng/mL MDMA (Ecstasy), urine              Cutoff 500 ng/mL Cocaine Metabolite, urine          Cutoff 300 ng/mL Opiate + metabolites, urine        Cutoff 300 ng/mL Phencyclidine (PCP), urine         Cutoff 25 ng/mL Cannabinoid, urine                 Cutoff 50 ng/mL Barbiturates + metabolites, urine  Cutoff 200 ng/mL Benzodiazepine, urine              Cutoff 200 ng/mL Methadone, urine                   Cutoff 300 ng/mL  The  urine drug screen provides only a preliminary, unconfirmed analytical test result and should not be used for non-medical purposes. Clinical consideration and professional judgment should be applied to any positive drug screen result due to possible interfering substances. A more specific alternate chemical method must be used in order to obtain a confirmed analytical result. Gas chromatography / mass spectrometry (GC/MS) is the preferred confirm atory method. Performed at [facility], [address] [place], [place] [address]   Comprehensive metabolic panel     Status: Abnormal   Collection Time: 04/10/22 11:02 PM  Result Value Ref Range   Sodium 137 135 - 145 mmol/L   Potassium 3.9 3.5 - 5.1 mmol/L   Chloride 103 98 - 111 mmol/L   CO2 24 22 - 32 mmol/L   Glucose, Bld 103 (H) 70 - 99 mg/dL    Comment: Glucose reference range applies only to samples taken after fasting for at least 8 hours.   BUN 11 6 - 20 mg/dL   Creatinine, Ser 0.86 0.61 - 1.24 mg/dL   Calcium 8.3 (L) 8.9 - 10.3 mg/dL   Total Protein 6.8 6.5 - 8.1 g/dL   Albumin 3.6 3.5 - 5.0 g/dL   AST 26 15 - 41 U/L   ALT 15 0 - 44 U/L   Alkaline Phosphatase 65 38 - 126 U/L   Total Bilirubin 0.4 0.3 - 1.2 mg/dL   GFR, Estimated >57 >84 mL/min    Comment: (NOTE) Calculated using the CKD-EPI Creatinine Equation (2021)    Anion gap 10 5 - 15    Comment: Performed at  [facility], [address] [place], [place] [address]  Ethanol     Status: None   Collection Time: 04/10/22 11:02 PM  Result Value Ref Range   Alcohol, Ethyl (B) <10 <10 mg/dL    Comment: (NOTE) Lowest detectable limit for serum alcohol is 10 mg/dL.  For medical purposes only. Performed at [facility], [address] [place], [place] [address]   Salicylate level     Status: Abnormal   Collection Time: 04/10/22 11:02 PM  Result Value Ref Range   Salicylate Lvl <7.0 (L) 7.0 - 30.0 mg/dL    Comment: Performed at [facility], [address] [place], [place] [address]  Acetaminophen level     Status: Abnormal   Collection Time: 04/10/22 11:02 PM  Result Value Ref Range   Acetaminophen (Tylenol), Serum <10 (L) 10 - 30 ug/mL    Comment: (NOTE) Therapeutic concentrations vary significantly. A range of 10-30 ug/mL  may be an effective concentration for many patients. However, some  are best treated at concentrations outside of this range. Acetaminophen concentrations >150 ug/mL at 4 hours after ingestion  and >50 ug/mL at 12 hours after ingestion are often associated with  toxic reactions.  Performed at [facility], [address] [place], [place] [address]   cbc     Status: Abnormal   Collection Time: 04/10/22 11:02 PM  Result Value Ref Range   WBC 12.1 (H) 4.0 - 10.5 K/uL   RBC 4.70 4.22 - 5.81 MIL/uL   Hemoglobin 13.6 13.0 - 17.0 g/dL   HCT 65.7 84.6 - 96.2 %   MCV 87.4 80.0 - 100.0 fL   MCH 28.9 26.0 - 34.0 pg   MCHC 33.1 30.0 - 36.0 g/dL   RDW 95.2 84.1 - 32.4 %   Platelets 349 150 - 400 K/uL   nRBC 0.0 0.0 - 0.2 %    Comment: Performed at [facility], [address] [place], [place] [address]  Urine Drug Screen, Qualitative     Status: Abnormal   Collection Time: 04/10/22 11:02 PM  Result Value Ref Range   Tricyclic, Ur Screen POSITIVE (A) NONE DETECTED   Amphetamines, Ur Screen NONE DETECTED NONE DETECTED   MDMA (Ecstasy)Ur Screen NONE DETECTED NONE DETECTED   Cocaine Metabolite,Ur [place] NONE DETECTED NONE DETECTED   Opiate, Ur Screen NONE DETECTED NONE DETECTED   Phencyclidine (PCP) Ur S NONE DETECTED NONE DETECTED   Cannabinoid 50 Ng, Ur  POSITIVE (A) NONE DETECTED   Barbiturates, Ur Screen NONE DETECTED NONE DETECTED   Benzodiazepine, Ur Scrn NONE DETECTED NONE DETECTED   Methadone Scn, Ur NONE DETECTED NONE DETECTED    Comment: (NOTE) Tricyclics + metabolites, urine    Cutoff 1000 ng/mL Amphetamines + metabolites, urine  Cutoff 1000 ng/mL MDMA  (Ecstasy), urine              Cutoff 500 ng/mL Cocaine Metabolite, urine          Cutoff 300 ng/mL Opiate + metabolites, urine        Cutoff 300 ng/mL Phencyclidine (PCP), urine         Cutoff 25 ng/mL Cannabinoid, urine                 Cutoff 50 ng/mL Barbiturates + metabolites, urine  Cutoff 200 ng/mL Benzodiazepine, urine              Cutoff 200 ng/mL Methadone, urine  Cutoff 300 ng/mL  The urine drug screen provides only a preliminary, unconfirmed analytical test result and should not be used for non-medical purposes. Clinical consideration and professional judgment should be applied to any positive drug screen result due to possible interfering substances. A more specific alternate chemical method must be used in order to obtain a confirmed analytical result. Gas chromatography / mass spectrometry (GC/MS) is the preferred confirm atory method. Performed at [facility], [address] [place], [place] [address]   Comprehensive metabolic panel     Status: Abnormal   Collection Time: 05/23/22  2:19 AM  Result Value Ref Range   Sodium 135 135 - 145 mmol/L   Potassium 3.7 3.5 - 5.1 mmol/L   Chloride 100 98 - 111 mmol/L   CO2 25 22 - 32 mmol/L   Glucose, Bld 102 (H) 70 - 99 mg/dL    Comment: Glucose reference range applies only to samples taken after fasting for at least 8 hours.   BUN 10 6 - 20 mg/dL   Creatinine, Ser 8.46 0.61 - 1.24 mg/dL   Calcium 8.8 (L) 8.9 - 10.3 mg/dL   Total Protein 7.8 6.5 - 8.1 g/dL   Albumin 4.6 3.5 - 5.0 g/dL   AST 23 15 - 41 U/L   ALT 14 0 - 44 U/L   Alkaline Phosphatase 72 38 - 126 U/L   Total Bilirubin 0.6 0.3 - 1.2 mg/dL   GFR, Estimated >96 >29 mL/min    Comment: (NOTE) Calculated using the CKD-EPI Creatinine Equation (2021)    Anion gap 10 5 - 15    Comment: Performed at [facility], [address] [place], [place] [address]  Ethanol     Status: None   Collection Time: 05/23/22  2:19 AM  Result  Value Ref Range   Alcohol, Ethyl (B) <10 <10 mg/dL    Comment: (NOTE) Lowest detectable limit for serum alcohol is 10 mg/dL.  For medical purposes only. Performed at [facility], [address] [place], [place] [address]   Salicylate level     Status: Abnormal   Collection Time: 05/23/22  2:19 AM  Result Value Ref Range   Salicylate Lvl <7.0 (L) 7.0 - 30.0 mg/dL    Comment: Performed at [facility], [address] [place], [place] [address]  Acetaminophen level     Status: Abnormal   Collection Time: 05/23/22  2:19 AM  Result Value Ref Range   Acetaminophen (Tylenol), Serum <10 (L) 10 - 30 ug/mL    Comment: (NOTE) Therapeutic concentrations vary significantly. A range of 10-30 ug/mL  may be an effective concentration for many patients. However, some  are best treated at concentrations outside of this range. Acetaminophen concentrations >150 ug/mL at 4 hours after ingestion  and >50 ug/mL at 12 hours after ingestion are often associated with  toxic reactions.  Performed at [facility], [address] [place], [place] [address]   cbc     Status: Abnormal   Collection Time: 05/23/22  2:19 AM  Result Value Ref Range   WBC 11.0 (H) 4.0 - 10.5 K/uL   RBC 4.81 4.22 - 5.81 MIL/uL   Hemoglobin 14.0 13.0 - 17.0 g/dL   HCT 40.3 47.4 - 25.9 %   MCV 87.7 80.0 - 100.0 fL   MCH 29.1 26.0 - 34.0 pg   MCHC 33.2 30.0 - 36.0 g/dL   RDW 56.3 87.5 - 64.3 %   Platelets 374 150 - 400  K/uL   nRBC 0.0 0.0 - 0.2 %    Comment: Performed at [facility], [address] [place], [place] [address]  HSV 1 and 2 Ab, IgG     Status: None   Collection Time: 06/05/22 11:59 AM  Result Value Ref Range   HSV 1 Glycoprotein G Ab, IgG <0.91 0.00 - 0.90 index    Comment:                                  Negative        <0.91                                  Equivocal 0.91 - 1.09                                  Positive        >1.09  Note: Negative indicates no  antibodies detected to  HSV-1. Equivocal may suggest early infection.  If  clinically appropriate, retest at later date. Positive  indicates antibodies detected to HSV-1.    HSV 2 IgG, Type Spec <0.91 0.00 - 0.90 index    Comment:                                  Negative        <0.91                                  Equivocal 0.91 - 1.09                                  Positive        >1.09  HSV-2 Antibody Interpretation: Current guidelines and  recommendations do not recommend routine screening  for HSV-2 in asymptomatic individuals, including  those that are pregnant. A negative antibody result  indicates no detectable antibodies to HSV-2 were  found. If recent exposure is suspected, retest in 4  to 6 weeks. Equivocal samples should be retested in  4 to 6 weeks. A positive result indicates the  presence of detectable IgG antibody to HSV-2.  FALSE POSITIVE RESULTS MAY OCCUR. Repeat testing, or  testing by a different method, may be indicated in  some settings (e.g. patients with low likelihood of  HSV infection). If clinically appropriate, retest 4  to 6 weeks later. HSV-2 IgG antibody testing results  should be clinically correlated.   RPR w/reflex to TrepSure     Status: None   Collection Time: 06/05/22 11:59 AM  Result Value Ref Range   RPR Non Reactive Non Reactive  Acute Viral Hepatitis (HAV, HBV, HCV)     Status: None   Collection Time: 06/05/22 11:59 AM  Result Value Ref Range   Hep A IgM Negative Negative   Hepatitis B Surface Ag Negative Negative   Hep B C IgM Negative Negative   HCV Ab Non Reactive Non Reactive  HIV Antibody (routine testing w rflx)     Status: None   Collection Time: 06/05/22 11:59 AM  Result Value Ref Range   HIV  Screen 4th Generation wRfx Non Reactive Non Reactive    Comment: HIV-1/HIV-2 antibodies and HIV-1 p24 antigen were NOT detected. There is no laboratory evidence of HIV infection. HIV Negative   Lipid panel     Status: Abnormal    Collection Time: 06/05/22 11:59 AM  Result Value Ref Range   Cholesterol, Total 177 100 - 199 mg/dL   Triglycerides 409 (H) 0 - 149 mg/dL   HDL 41 >81 mg/dL   VLDL Cholesterol Cal 29 5 - 40 mg/dL   LDL Chol Calc (NIH) 191 (H) 0 - 99 mg/dL   Chol/HDL Ratio 4.3 0.0 - 5.0 ratio    Comment:                                   T. Chol/HDL Ratio                                             Men  Women                               1/2 Avg.Risk  3.4    3.3                                   Avg.Risk  5.0    4.4                                2X Avg.Risk  9.6    7.1                                3X Avg.Risk 23.4   11.0   VITAMIN D 25 Hydroxy (Vit-D Deficiency, Fractures)     Status: Abnormal   Collection Time: 06/05/22 11:59 AM  Result Value Ref Range   Vit D, 25-Hydroxy 18.0 (L) 30.0 - 100.0 ng/mL    Comment: Vitamin D deficiency has been defined by the Institute of Medicine and an Endocrine Society practice guideline as a level of serum 25-OH vitamin D less than 20 ng/mL (1,2). The Endocrine Society went on to further define vitamin D insufficiency as a level between 21 and 29 ng/mL (2). 1. IOM (Institute of Medicine). 2010. Dietary reference    intakes for calcium and D. [place]: The    [employer]. 2. Holick MF, Binkley [place], Bischoff-Ferrari HA, et al.    Evaluation, treatment, and prevention of vitamin D    deficiency: an Endocrine Society clinical practice    guideline. JCEM. 2011 Jul; 96(7):1911-30.   CBC With Differential     Status: None   Collection Time: 06/05/22 11:59 AM  Result Value Ref Range   WBC 7.9 3.4 - 10.8 x10E3/uL   RBC 4.88 4.14 - 5.80 x10E6/uL   Hemoglobin 14.5 13.0 - 17.7 g/dL   Hematocrit 47.8 29.5 - 51.0 %   MCV 87 79 - 97 fL   MCH 29.7 26.6 - 33.0 pg   MCHC 34.2 31.5 - 35.7 g/dL   RDW 62.1 30.8 - 65.7 %   Neutrophils 64 Not Estab. %  Lymphs 26 Not Estab. %   Monocytes 8 Not Estab. %   Eos 2 Not Estab. %   Basos 0 Not Estab. %    Neutrophils Absolute 5.0 1.4 - 7.0 x10E3/uL   Lymphocytes Absolute 2.0 0.7 - 3.1 x10E3/uL   Monocytes Absolute 0.6 0.1 - 0.9 x10E3/uL   EOS (ABSOLUTE) 0.2 0.0 - 0.4 x10E3/uL   Basophils Absolute 0.0 0.0 - 0.2 x10E3/uL   Immature Granulocytes 0 Not Estab. %   Immature Grans (Abs) 0.0 0.0 - 0.1 x10E3/uL  CMP14+EGFR     Status: Abnormal   Collection Time: 06/05/22 11:59 AM  Result Value Ref Range   Glucose 110 (H) 70 - 99 mg/dL   BUN 11 6 - 20 mg/dL   Creatinine, Ser 8.11 0.76 - 1.27 mg/dL   eGFR 98 >91 YN/WGN/5.62   BUN/Creatinine Ratio 11 9 - 20   Sodium 137 134 - 144 mmol/L   Potassium 4.1 3.5 - 5.2 mmol/L   Chloride 99 96 - 106 mmol/L   CO2 22 20 - 29 mmol/L   Calcium 9.4 8.7 - 10.2 mg/dL   Total Protein 6.9 6.0 - 8.5 g/dL   Albumin 4.4 4.1 - 5.1 g/dL   Globulin, Total 2.5 1.5 - 4.5 g/dL   Albumin/Globulin Ratio 1.8 1.2 - 2.2   Bilirubin Total 0.4 0.0 - 1.2 mg/dL   Alkaline Phosphatase 84 44 - 121 IU/L   AST 19 0 - 40 IU/L   ALT 13 0 - 44 IU/L  Hemoglobin A1c     Status: Abnormal   Collection Time: 06/05/22 11:59 AM  Result Value Ref Range   Hgb A1c MFr Bld 5.7 (H) 4.8 - 5.6 %    Comment:          Prediabetes: 5.7 - 6.4          Diabetes: >6.4          Glycemic control for adults with diabetes: <7.0    Est. average glucose Bld gHb Est-mCnc 117 mg/dL  Vitamin Z30     Status: None   Collection Time: 06/05/22 11:59 AM  Result Value Ref Range   Vitamin B-12 393 232 - 1,245 pg/mL  Interpretation:     Status: None   Collection Time: 06/05/22 11:59 AM  Result Value Ref Range   HCV Interp 1: Comment     Comment: Not infected with HCV unless early or acute infection is suspected (which may be delayed in an immunocompromised individual), or other evidence exists to indicate HCV infection.   Treponemal Antibodies, TPPA     Status: None   Collection Time: 06/05/22 11:59 AM  Result Value Ref Range   Treponemal Antibodies, TPPA Non Reactive Non Reactive   Interpretation:  Comment     Comment: Syphilis: Treponemal Antibodies with Reflex to RPR and RPR           Titer, Reverse Screening and Diagnosis Algorithm ------------------------------------------------------------ Treponemal              Treponemal     Ab       RPR, Qn     Ab, TPPA    Final Interpretation ----------   --------   ----------   ----------------------- Non          N/A        N/A          No laboratory evidence Reactive  of syphilis. Retest in                                      2-4 weeks if recent                                      exposure is suspected. ----------   --------   ----------   ----------------------- Reactive     Non        Non          Treponemal antibodies              Reactive   Reactive     not confirmed.                                      Inconclusive for                                      syphilis; potential                                      early syphilis,                                      possible false                                       positive. Retest in 2-4                                      weeks if recent                                      exposure is suspected. ----------   --------   ----------   ----------------------- Reactive     Non        Reactive     Treponemal antibodies              Reactive                detected. Consistent                                      with past or current                                      (potential early)                                      syphilis. ----------   --------   ----------   -----------------------  Reactive     >/=1:1     N/A          Treponemal and                                      nontreponemal                                      antibodies detected.                                      Consistent with current                                      or past syphilis. This test is intended ONLY for specimens that have tested positive (reactive) or  equivocal for Treponema pallidum antibodies prior to submission for testing.  For the full CDC-recommended syphilis screening and diagnosis algorithm, [facility] offers test code 012005 RPR, Rfx Qn RPR/Confirm TP or 161096 T pallidum Screening Cascade.   Ct/Ng NAA rfx Tv NAA     Status: None   Collection Time: 06/05/22  4:22 PM   UC  Result Value Ref Range   Chlamydia by NAA Negative Negative   Gonococcus by NAA Negative Negative   Trich vag by NAA Rfx Comment     Comment: Reflex criteria were not met. Trich vag by NAA testing was not performed.        Assessment & Plan:   Problem List Items Addressed This Visit       Active Problems   Schizoaffective disorder, bipolar type [facility])    Patient is managed by psychiatry.  He is well maintained on his current meds.  Will defer changes to them.        Cannabis use disorder, moderate, dependence (HCC)    Patient is still abusing substances  We have discussed today the importance of stopping use, as this can exacerbate his psychiatric conditions.   Patient verbalizes understanding.       Prediabetes - Primary    Patient educated on foods that contain carbohydrates and the need to decrease intake.  We discussed prediabetes, and what it means and the need for strict dietary control to prevent progression to type 2 diabetes.  Advised to decrease intake of sugary drinks, including sodas, sweet tea, and some juices, and of starch and sugar heavy foods (ie., potatoes, rice, bread, pasta, desserts). He verbalizes understanding and agreement with the changes discussed today.       Relevant Orders   CBC With Differential (Completed)   Hemoglobin A1c (Completed)   Cocaine abuse (HCC)    Patient is still abusing substances  We have discussed today the importance of stopping use, as this can exacerbate his psychiatric conditions.   Patient verbalizes understanding.       Vitamin D deficiency, unspecified   Relevant Orders   VITAMIN D  25 Hydroxy (Vit-D Deficiency, Fractures) (Completed)   CBC With Differential (Completed)   Mixed hyperlipidemia    Checking labs today.  Continue current therapy for lipid control. Will modify as needed based on labwork results.        Relevant  Orders   Lipid panel (Completed)   CBC With Differential (Completed)   Other Visit Diagnoses     Contact with and (suspected) exposure to infections with a predominantly sexual mode of transmission       will run STI testing for pt.   Reminded patient of the importance of minimizing risk of sexual behaviors.   Relevant Orders   HSV 1 and 2 Ab, IgG (Completed)   RPR w/reflex to TrepSure (Completed)   Acute Viral Hepatitis (HAV, HBV, HCV) (Completed)   HIV Antibody (routine testing w rflx) (Completed)   CBC With Differential (Completed)   STI Profile, CT/NG/TV   Ct/Ng NAA rfx Tv NAA (Completed)   B12 deficiency due to diet       Relevant Orders   CBC With Differential (Completed)   Vitamin B12 (Completed)   Essential hypertension, benign       Relevant Orders   CBC With Differential (Completed)   CMP14+EGFR (Completed)       No follow-ups on file.   Total time spent: 30 minutes  Miki Kins, FNP  06/05/2022   This document may have been prepared by [facility] Voice Recognition software and as such may include unintentional dictation errors.

## 2022-06-08 NOTE — Assessment & Plan Note (Signed)
Patient is still abusing substances  We have discussed today the importance of stopping use, as this can exacerbate his psychiatric conditions.   Patient verbalizes understanding.  

## 2022-07-02 ENCOUNTER — Emergency Department
Admission: EM | Admit: 2022-07-02 | Discharge: 2022-07-03 | Disposition: A | Discharge status: Home / Self Care | Payer: Medicare HMO | Attending: Emergency Medicine | Admitting: Emergency Medicine

## 2022-07-02 ENCOUNTER — Other Ambulatory Visit: Payer: Self-pay

## 2022-07-02 DIAGNOSIS — R440 Auditory hallucinations: Secondary | ICD-10-CM | POA: Diagnosis not present

## 2022-07-02 DIAGNOSIS — J45909 Unspecified asthma, uncomplicated: Secondary | ICD-10-CM | POA: Insufficient documentation

## 2022-07-02 DIAGNOSIS — F17210 Nicotine dependence, cigarettes, uncomplicated: Secondary | ICD-10-CM | POA: Diagnosis not present

## 2022-07-02 DIAGNOSIS — Z79899 Other long term (current) drug therapy: Secondary | ICD-10-CM | POA: Diagnosis not present

## 2022-07-02 DIAGNOSIS — F250 Schizoaffective disorder, bipolar type: Secondary | ICD-10-CM | POA: Diagnosis not present

## 2022-07-02 LAB — COMPREHENSIVE METABOLIC PANEL
ALT: 15 U/L (ref 0–44)
AST: 22 U/L (ref 15–41)
Albumin: 4.3 g/dL (ref 3.5–5.0)
Alkaline Phosphatase: 65 U/L (ref 38–126)
Anion gap: 10 (ref 5–15)
BUN: 10 mg/dL (ref 6–20)
CO2: 26 mmol/L (ref 22–32)
Calcium: 9 mg/dL (ref 8.9–10.3)
Chloride: 100 mmol/L (ref 98–111)
Creatinine, Ser: 0.84 mg/dL (ref 0.61–1.24)
GFR, Estimated: >60 mL/min (ref >60)
Glucose, Bld: 93 mg/dL (ref 70–99)
Potassium: 3.7 mmol/L (ref 3.5–5.1)
Sodium: 136 mmol/L (ref 135–145)
Total Bilirubin: 0.8 mg/dL (ref 0.3–1.2)
Total Protein: 7.8 g/dL (ref 6.5–8.1)

## 2022-07-02 LAB — CBC
HCT: 41.7 % (ref 39.0–52.0)
Hemoglobin: 13.9 g/dL (ref 13.0–17.0)
MCH: 29.1 pg (ref 26.0–34.0)
MCHC: 33.3 g/dL (ref 30.0–36.0)
MCV: 87.4 fL (ref 80.0–100.0)
Platelets: 381 10*3/uL (ref 150–400)
RBC: 4.77 MIL/uL (ref 4.22–5.81)
RDW: 12.8 % (ref 11.5–15.5)
WBC: 9.5 10*3/uL (ref 4.0–10.5)
nRBC: 0 % (ref 0.0–0.2)

## 2022-07-02 LAB — URINE DRUG SCREEN, QUALITATIVE (ARMC ONLY)
Amphetamines, Ur Screen: NOT DETECTED
Barbiturates, Ur Screen: NOT DETECTED
Benzodiazepine, Ur Scrn: NOT DETECTED
Cannabinoid 50 Ng, Ur ~~LOC~~: POSITIVE — AB
Cocaine Metabolite,Ur ~~LOC~~: POSITIVE — AB
MDMA (Ecstasy)Ur Screen: NOT DETECTED
Methadone Scn, Ur: NOT DETECTED
Opiate, Ur Screen: NOT DETECTED
Phencyclidine (PCP) Ur S: NOT DETECTED
Tricyclic, Ur Screen: POSITIVE — AB

## 2022-07-02 LAB — CHLAMYDIA/NGC RT PCR (ARMC ONLY)
Chlamydia Tr: NOT DETECTED
N gonorrhoeae: NOT DETECTED

## 2022-07-02 LAB — SALICYLATE LEVEL: Salicylate Lvl: <7 mg/dL — ABNORMAL LOW (ref 7.0–30.0)

## 2022-07-02 LAB — ETHANOL: Alcohol, Ethyl (B): <10 mg/dL (ref <10)

## 2022-07-02 LAB — ACETAMINOPHEN LEVEL: Acetaminophen (Tylenol), Serum: <10 ug/mL — ABNORMAL LOW (ref 10–30)

## 2022-07-02 MED ORDER — QUETIAPINE FUMARATE 200 MG PO TABS
200.0000 mg | ORAL_TABLET | Freq: Every day | ORAL | Status: DC
  Administered 2022-07-03: 200 mg via ORAL
  Filled 2022-07-02: qty 1

## 2022-07-02 MED ORDER — DIVALPROEX SODIUM ER 500 MG PO TB24
500.0000 mg | ORAL_TABLET | Freq: Three times a day (TID) | ORAL | Status: DC
  Administered 2022-07-03 (×2): 500 mg via ORAL
  Filled 2022-07-02: qty 2
  Filled 2022-07-02: qty 1

## 2022-07-02 NOTE — ED Triage Notes (Signed)
Pt to ED with ACT team member states he is hearing voices. Pt called crisis phone at 0400, [phone], 0900. Denied SI. Reports HI, denies plan. States whole neighborhood is going to harm him.  Pt with disorganized thinking.

## 2022-07-02 NOTE — ED Provider Notes (Signed)
[facility] Provider Note    Event Date/Time   First MD Initiated Contact with Patient 07/02/22 1757     (approximate)   History   Psychiatric Evaluation   HPI  Kenneth Jensen is a 32 y.o. male  with pmh bipolar disorder, schizophrenia who presents because of hearing voices.  Patient tells me he is hearing voices but that this is not a new thing for him.  He is somewhat disorganized and not able to provide coherent history.  When I ask him whether he has any HI or SI he says "he prays".  Does admit to using crack cocaine.  Tells me he would like to be tested for all diseases including STDs.  Denies any symptoms including dysuria penile discharge or ulcers.     Past Medical History:  Diagnosis Date   Asthma    Bipolar 1 disorder (HCC)    Depression    Schizophrenia (HCC)    Schizotaxia     Patient Active Problem List   Diagnosis Date Noted   Vitamin D deficiency, unspecified 06/08/2022   Mixed hyperlipidemia 06/08/2022   Cocaine abuse (HCC) 11/22/2021   Prediabetes 05/16/2020   Schizoaffective disorder, bipolar type (HCC) 03/25/2019   Cannabis use disorder, moderate, dependence (HCC) 04/21/2011   Tobacco use disorder 04/21/2011     Physical Exam  Triage Vital Signs: ED Triage Vitals  Enc Vitals Group     BP 07/02/22 1715 (!) 126/91     Pulse Rate 07/02/22 1715 94     Resp 07/02/22 1715 20     Temp 07/02/22 1715 98.3 F (36.8 C)     Temp src --      SpO2 07/02/22 1715 98 %     Weight 07/02/22 1718 213 lb (96.6 kg)     Height 07/02/22 1718 5\' 4"  (1.626 m)     Head Circumference --      Peak Flow --      Pain Score 07/02/22 1716 0     Pain Loc --      Pain Edu? --      Excl. in GC? --     Most recent vital signs: Vitals:   07/02/22 1715  BP: (!) 126/91  Pulse: 94  Resp: 20  Temp: 98.3 F (36.8 C)  SpO2: 98%     General: Awake, no distress.  CV:  Good peripheral perfusion.  Resp:  Normal effort.  Abd:  No  distention.  Neuro:             Awake, Alert, Oriented x 3  Other:  Patient is disorganized but calm cooperative   ED Results / Procedures / Treatments  Labs (all labs ordered are listed, but only abnormal results are displayed) Labs Reviewed  SALICYLATE LEVEL - Abnormal; Notable for the following components:      Result Value   Salicylate Lvl <7.0 (*)    All other components within normal limits  ACETAMINOPHEN LEVEL - Abnormal; Notable for the following components:   Acetaminophen (Tylenol), Serum <10 (*)    All other components within normal limits  URINE DRUG SCREEN, QUALITATIVE ([facility] ONLY) - Abnormal; Notable for the following components:   Tricyclic, Ur Screen POSITIVE (*)    Cocaine Metabolite,Ur [place] POSITIVE (*)    Cannabinoid 50 Ng, Ur [place] POSITIVE (*)    All other components within normal limits  CHLAMYDIA/NGC RT PCR ([facility] ONLY)            COMPREHENSIVE  METABOLIC PANEL  CBC  ETHANOL  HIV ANTIBODY (ROUTINE TESTING W REFLEX)  RPR     EKG     RADIOLOGY    PROCEDURES:  Critical Care performed: No  Procedures   MEDICATIONS ORDERED IN ED: Medications - No data to display   IMPRESSION / MDM / ASSESSMENT AND PLAN / ED COURSE  I reviewed the triage vital signs and the nursing notes.                              Patient's presentation is most consistent with exacerbation of chronic illness.  Differential diagnosis includes, but is not limited to, decompensated psychosis, substance-induced psychosis, medication noncompliance  Patient is a 32 year old male who presents because of hearing voices.  Apparently he was brought in by his ACT team.  Per triage note he has stated the whole neighborhood was going to harm him and he reported HI but was denying a plan.  When I speak with him he really does not give me a straight answer about SI or HI and just says "I pray".  He does admit to using crack cocaine and says he would like to be tested for STDs and diseases.  He  is however calm and cooperative and does not appear agitated.  Patient's labs overall reassuring.  I have ordered GC chlamydia as well as RPR and HIV at patient's request although he is not having any symptoms.  At this point given he is calm and cooperative we will keep involuntary.  I have consulted psychiatry.       FINAL CLINICAL IMPRESSION(S) / ED DIAGNOSES   Final diagnoses:  Auditory hallucinations     Rx / DC Orders   ED Discharge Orders     None        Note:  This document was prepared using Dragon voice recognition software and may include unintentional dictation errors.   Georga Hacking, MD 07/02/22 [phone]

## 2022-07-02 NOTE — BH Assessment (Addendum)
Comprehensive Clinical Assessment (CCA) Screening, Triage and Referral Note  07/02/2022 Kenneth Jensen [id number] Recommendations for Services/Supports/Treatments: Psych consult/disposition pending.  Kenneth Jensen. [name] is a 32 year old, English speaking, Black male with a hx of schizoaffective disorder, bipolar type and cannabis use d/o, moderate. Pt presented to [facility] ED Vol from home with complaints of auditory hallucinations. Per triage note: Pt to ED with ACT team member states he is hearing voices. Pt called crisis phone at 0400, 0500, 0900. Denied SI. Reports HI, denies plan. States whole neighborhood is going to harm him. Pt with disorganized thinking.  Pt was resting with the covers over his head. When asked how pt was feeling the pt stated, "I'm just relaxing". Pt identified his main stressor as hearing voices. When asked why he'd presented to the ER pt. stated, "They always talking." The patient admitted to using weed and cocaine 72 hours ago. Pt denied the need for substance abuse treatment. The pt. made no eye contact and proceeded talking with his eyes closed. Pt appeared to be responding to internal stimuli, evidenced by thought blocking. Pt's thoughts were relevant and he was oriented x3. Pt had a normal rate of speech and unremarkable psychomotor activity. Pt had a neglected appearance. Pt presented with a euthymic mood; affect was congruent. Pt endorsed AH. Pt denied current SI/HI/V/H.   Chief Complaint:  Chief Complaint  Patient presents with   Psychiatric Evaluation   Visit Diagnosis:  Schizoaffective disorder, bipolar type (HCC) Active Problems:   Cannabis use disorder, moderate, dependence (HCC)   Cocaine abuse (HCC)  Patient Reported Information How did you hear about [place]? Other (Comment) (ACT Team)  What Is the Reason for Your Visit/Call Today? Pt to ED with ACT team member states he is hearing voices. Pt called crisis phone at 0400, 0500, 0900. Denied SI. Reports  HI, denies plan. States whole neighborhood is going to harm him. Pt with disorganized thinking.  How Long Has This Been Causing You Problems? > than 6 months  What Do You Feel Would Help You the Most Today? Treatment for Depression or other mood problem   Have You Recently Had Any Thoughts About Hurting Yourself? No  Are You Planning to Commit Suicide/Harm Yourself At This time? No   Have you Recently Had Thoughts About Hurting Someone Kenneth Jensen? No  Are You Planning to Harm Someone at This Time? No  Explanation: n/a   Have You Used Any Alcohol or Drugs in the Past 24 Hours? No  How Long Ago Did You Use Drugs or Alcohol? No data recorded What Did You Use and How Much? Pt denied recent use.   Do You Currently Have a Therapist/Psychiatrist? Yes  Name of Therapist/Psychiatrist: ACT Team   Have You Been Recently Discharged From Any Office Practice or Programs? No  Explanation of Discharge From Practice/Program: n/a    CCA Screening Triage Referral Assessment Type of Contact: Face-to-Face  Telemedicine Service Delivery:   Is this Initial or Reassessment?   Date Telepsych consult ordered in [facility]:    Time Telepsych consult ordered in [facility]:    Location of Assessment: [facility] ED  Provider Location: [facility] ED    Collateral Involvement: n/a   Does Patient Have a Court Appointed Legal Guardian? No data recorded Name and Contact of Legal Guardian: No data recorded If Minor and Not Living with Parent(s), Who has Custody? n/a  Is CPS involved or ever been involved? Never  Is APS involved or ever been involved? Never   Patient Determined To  Be At Risk for Harm To Self or Others Based on Review of Patient Reported Information or Presenting Complaint? No  Method: No Plan  Availability of Means: No access or NA  Intent: Vague intent or NA  Notification Required: No need or identified person  Additional Information for Danger to Others Potential: -- (n/a)  Additional Comments  for Danger to Others Potential: n/a  Are There Guns or Other Weapons in Your Home? No  Types of Guns/Weapons: n/a  Are These Weapons Safely Secured?                            -- (n/a)  Who Could Verify You Are Able To Have These Secured: n/a  Do You Have any Outstanding Charges, Pending Court Dates, Parole/Probation? None reported  Contacted To Inform of Risk of Harm To Self or Others: Other: Comment (n/a)   Does Patient Present under Involuntary Commitment? No    County of Residence: [place]   Patient Currently Receiving the Following Services: Medication Management; ACTT ([facility])   Determination of Need: Emergent (2 hours)   Options For Referral: ED Visit   Discharge Disposition:     Ginelle Bays R Sylvan Sookdeo, LCAS

## 2022-07-02 NOTE — ED Notes (Signed)
Dressed out by this [employer] fundraiser and [employer]  Yellow crocs  Camo pants  Camo Shirt  Pink book bag  Key on lanyard  Green and white sock Gray and green sock [employer]  Black wallet  [email or web address]

## 2022-07-02 NOTE — ED Notes (Signed)
Vol pending consult 

## 2022-07-03 DIAGNOSIS — R440 Auditory hallucinations: Secondary | ICD-10-CM

## 2022-07-03 LAB — HIV ANTIBODY (ROUTINE TESTING W REFLEX): HIV Screen 4th Generation wRfx: NONREACTIVE

## 2022-07-03 LAB — RPR: RPR Ser Ql: NONREACTIVE

## 2022-07-03 NOTE — ED Notes (Signed)
Graham crackers and water provided for snack.  

## 2022-07-03 NOTE — ED Notes (Signed)
Breakfast tray and juice provided 

## 2022-07-03 NOTE — ED Notes (Signed)
Pt resting- will obtain vitals upon waking and continue to monitor.

## 2022-07-03 NOTE — Consult Note (Signed)
[facility] Face-to-Face Psychiatry Consult   Reason for Consult:  Auditory Hallucinations Referring Physician:  Georga Hacking MD Patient Identification: Kenneth Jensen MRN:  [id number] Principal Diagnosis: <principal problem not specified> Diagnosis:  Active Problems:   Schizoaffective disorder, bipolar type (HCC)   Total Time spent with patient: 15 minutes  Subjective:   Kenneth Jensen is a 32 y.o. male patient admitted with auditory hallucinations. "I been hearing voices my whole life".  HPI:  Kenneth Jensen is a 32 y.o. male patient admitted voluntarily yesterday and was recommended for overnight observation and psychiatric evaluation today due to auditory hallucinations. He has a psychiatric hx of schizoaffective disorder, bipolar type, cannabis use disorder, tobacco use disorder, and cocaine abuse. Additional diagnoses noted in his medical record include bipolar 1 disorder, depression, schizophrenia, and schizotaxia. Patient reports being prescribed Depakote 500 mg TID and states that he is compliant with medication. He is currently established with [facility] on an ACT team for medication management in the community. Per [facility] ACT team lead, Kenneth Jensen, patient is prescribed Depakote ER 500 mg TID as well as Gean Birchwood IM monthly and Seroquel 200 mg at bedtime. Team lead reports that patient is noncompliant with medications at times.   Per patient record, he has a hx of multiple psychiatric hospitalizations. His UDS is positive for cocaine, cannabinoid, and tricyclic. [facility] team lead reports that patient called their crisis line and the police transported patient to their crisis center. Patient reported to [facility] that he used illicit substances. Per team lead, patient was disorganized and irritable, requesting to go to [facility] to "get his mind cleared".   He reports a "so-so" mood, noted lying in the bed with covers over head. He states he has a headache, but engaged during visit. RN informed  of c/o headache. He states he has experienced auditory hallucinations since teenage years.  He denies SI/HI/delusional thought/paranoia.  Patient reports that he experiences visual hallucinations at times but unable to recall his last episode or last vision.  Patient is calm and cooperative and appears improved since initial presentation.  Past Psychiatric History: see above  Risk to Self:  denies Risk to Others:  denies Prior Inpatient Therapy:  yes. See above Prior Outpatient Therapy:  yes. See above  Past Medical History:  Past Medical History:  Diagnosis Date   Asthma    Bipolar 1 disorder (HCC)    Depression    Schizophrenia (HCC)    Schizotaxia    No past surgical history on file. Family History: No family history on file. Family Psychiatric  History: none known Social History:  Social History   Substance and Sexual Activity  Alcohol Use Yes   Comment: social     Social History   Substance and Sexual Activity  Drug Use Yes   Types: Marijuana, Cocaine    Social History   Socioeconomic History   Marital status: Single    Spouse name: Not on file   Number of children: Not on file   Years of education: Not on file   Highest education level: Not on file  Occupational History   Not on file  Tobacco Use   Smoking status: Every Day    Packs/day: .5    Types: Cigarettes   Smokeless tobacco: Never  Substance and Sexual Activity   Alcohol use: Yes    Comment: social   Drug use: Yes    Types: Marijuana, Cocaine   Sexual activity: Not Currently  Other Topics Concern  Not on file  Social History Narrative   ** Merged History Encounter **       Social Determinants of Health   Financial Resource Strain: Not on file  Food Insecurity: Not on file  Transportation Needs: Not on file  Physical Activity: Not on file  Stress: Not on file  Social Connections: Not on file   Additional Social History:    Allergies:   Allergies  Allergen Reactions    Amoxicillin Swelling   Haldol [Haloperidol]     Pt states "I went crazy"   Percocet [Oxycodone-Acetaminophen]    Vicodin [Hydrocodone-Acetaminophen] Itching   Vicodin [Hydrocodone-Acetaminophen]     Labs:  Results for orders placed or performed during the hospital encounter of 07/02/22 (from the past 48 hour(s))  Comprehensive metabolic panel     Status: None   Collection Time: 07/02/22  5:22 PM  Result Value Ref Range   Sodium 136 135 - 145 mmol/L   Potassium 3.7 3.5 - 5.1 mmol/L   Chloride 100 98 - 111 mmol/L   CO2 26 22 - 32 mmol/L   Glucose, Bld 93 70 - 99 mg/dL    Comment: Glucose reference range applies only to samples taken after fasting for at least 8 hours.   BUN 10 6 - 20 mg/dL   Creatinine, Ser 0.10 0.61 - 1.24 mg/dL   Calcium 9.0 8.9 - 93.2 mg/dL   Total Protein 7.8 6.5 - 8.1 g/dL   Albumin 4.3 3.5 - 5.0 g/dL   AST 22 15 - 41 U/L   ALT 15 0 - 44 U/L   Alkaline Phosphatase 65 38 - 126 U/L   Total Bilirubin 0.8 0.3 - 1.2 mg/dL   GFR, Estimated >35 >57 mL/min    Comment: (NOTE) Calculated using the CKD-EPI Creatinine Equation (2021)    Anion gap 10 5 - 15    Comment: Performed at [facility], [address], [place], [place] [address]  Salicylate level     Status: Abnormal   Collection Time: 07/02/22  5:22 PM  Result Value Ref Range   Salicylate Lvl <7.0 (L) 7.0 - 30.0 mg/dL    Comment: Performed at [facility], [address] [place], [place] [address]  Acetaminophen level     Status: Abnormal   Collection Time: 07/02/22  5:22 PM  Result Value Ref Range   Acetaminophen (Tylenol), Serum <10 (L) 10 - 30 ug/mL    Comment: (NOTE) Therapeutic concentrations vary significantly. A range of 10-30 ug/mL  may be an effective concentration for many patients. However, some  are best treated at concentrations outside of this range. Acetaminophen concentrations >150 ug/mL at 4 hours after ingestion  and >50 ug/mL at 12 hours after ingestion are often  associated with  toxic reactions.  Performed at [facility], [address] [place], [place] [address]   cbc     Status: None   Collection Time: 07/02/22  5:22 PM  Result Value Ref Range   WBC 9.5 4.0 - 10.5 K/uL   RBC 4.77 4.22 - 5.81 MIL/uL   Hemoglobin 13.9 13.0 - 17.0 g/dL   HCT 28.3 15.1 - 76.1 %   MCV 87.4 80.0 - 100.0 fL   MCH 29.1 26.0 - 34.0 pg   MCHC 33.3 30.0 - 36.0 g/dL   RDW 60.7 37.1 - 06.2 %   Platelets 381 150 - 400 K/uL   nRBC 0.0 0.0 - 0.2 %    Comment: Performed at [facility],  [address], [place], [place] [address]  Ethanol     Status: None   Collection Time: 07/02/22  5:22 PM  Result Value Ref Range   Alcohol, Ethyl (B) <10 <10 mg/dL    Comment: (NOTE) Lowest detectable limit for serum alcohol is 10 mg/dL.  For medical purposes only. Performed at [facility], [address] [place], [place] [address]   Urine Drug Screen, Qualitative     Status: Abnormal   Collection Time: 07/02/22  6:25 PM  Result Value Ref Range   Tricyclic, Ur Screen POSITIVE (A) NONE DETECTED   Amphetamines, Ur Screen NONE DETECTED NONE DETECTED   MDMA (Ecstasy)Ur Screen NONE DETECTED NONE DETECTED   Cocaine Metabolite,Ur [place] POSITIVE (A) NONE DETECTED   Opiate, Ur Screen NONE DETECTED NONE DETECTED   Phencyclidine (PCP) Ur S NONE DETECTED NONE DETECTED   Cannabinoid 50 Ng, Ur [place] POSITIVE (A) NONE DETECTED   Barbiturates, Ur Screen NONE DETECTED NONE DETECTED   Benzodiazepine, Ur Scrn NONE DETECTED NONE DETECTED   Methadone Scn, Ur NONE DETECTED NONE DETECTED    Comment: (NOTE) Tricyclics + metabolites, urine    Cutoff 1000 ng/mL Amphetamines + metabolites, urine  Cutoff 1000 ng/mL MDMA (Ecstasy), urine              Cutoff 500 ng/mL Cocaine Metabolite, urine          Cutoff 300 ng/mL Opiate + metabolites, urine        Cutoff 300 ng/mL Phencyclidine (PCP), urine         Cutoff 25 ng/mL Cannabinoid, urine                 Cutoff 50  ng/mL Barbiturates + metabolites, urine  Cutoff 200 ng/mL Benzodiazepine, urine              Cutoff 200 ng/mL Methadone, urine                   Cutoff 300 ng/mL  The urine drug screen provides only a preliminary, unconfirmed analytical test result and should not be used for non-medical purposes. Clinical consideration and professional judgment should be applied to any positive drug screen result due to possible interfering substances. A more specific alternate chemical method must be used in order to obtain a confirmed analytical result. Gas chromatography / mass spectrometry (GC/MS) is the preferred confirm atory method. Performed at [facility], [address] [place], [place] [address]   RPR     Status: None   Collection Time: 07/02/22  6:39 PM  Result Value Ref Range   RPR Ser Ql NON REACTIVE NON REACTIVE    Comment: Performed at [facility], [address], [place], [place] [address]  Chlamydia/NGC rt PCR [facility] only)     Status: None   Collection Time: 07/02/22  6:39 PM   Specimen: Urine  Result Value Ref Range   Specimen source GC/Chlam THROAT    Chlamydia Tr NOT DETECTED NOT DETECTED   N gonorrhoeae NOT DETECTED NOT DETECTED    Comment: (NOTE) This CT/NG assay has not been evaluated in patients with a history of  hysterectomy. Performed at [facility], [address] [place], [place] [address]     Current Facility-Administered Medications  Medication Dose Route Frequency Provider Last Rate Last Admin   divalproex (DEPAKOTE ER) 24 hr tablet 500 mg  500 mg Oral TID Georga Hacking, MD   500 mg at 07/03/22 1002   QUEtiapine (  SEROQUEL) tablet 200 mg  200 mg Oral QHS Georga Hacking, MD   200 mg at 07/03/22 1610   Current Outpatient Medications  Medication Sig Dispense Refill   divalproex (DEPAKOTE ER) 500 MG 24 hr tablet Take 1 tablet (500 mg total) by mouth 3 (three) times daily. 90 tablet 1   paliperidone (INVEGA SUSTENNA) 234  MG/1.5ML injection Inject 234 mg into the muscle every 28 (twenty-eight) days. 1.8 mL 1   QUEtiapine (SEROQUEL) 200 MG tablet Take 200 mg by mouth at bedtime.      Musculoskeletal: Strength & Muscle Tone: within normal limits Gait & Station: normal Patient leans: N/A            Psychiatric Specialty Exam:  Presentation  General Appearance:  Disheveled  Eye Contact: None  Speech: Clear and Coherent  Speech Volume: Normal  Handedness: Right   Mood and Affect  Mood: Euthymic (patient did yell when asked to repeat his response)  Affect: Other (comment) (covers over head)   Thought Process  Thought Processes: Linear  Descriptions of Associations:Intact  Orientation:Full (Time, Place and Person)  Thought Content:Logical  History of Schizophrenia/Schizoaffective disorder:Yes  Duration of Psychotic Symptoms:Greater than six months  Hallucinations:Hallucinations: Auditory  Ideas of Reference:None  Suicidal Thoughts:Suicidal Thoughts: No  Homicidal Thoughts:Homicidal Thoughts: No   Sensorium  Memory: Immediate Fair; Recent Fair; Remote Fair  Judgment: Fair  Insight: Fair   Art therapist  Concentration: Good  Attention Span: Good  Recall: Fair  Fund of Knowledge: Fair  Language: Good   Psychomotor Activity  Psychomotor Activity:Psychomotor Activity: Normal   Assets  Assets: Manufacturing systems engineer; Housing; Desire for Improvement; Social Support; Transportation   Sleep  Sleep:No data recorded  Physical Exam: Physical Exam Vitals reviewed.  Neurological:     Mental Status: He is oriented to person, place, and time.    Review of Systems  Psychiatric/Behavioral:  Positive for hallucinations and substance abuse.   All other systems reviewed and are negative.  Blood pressure 111/67, pulse 74, temperature 98.1 F (36.7 C), temperature source Oral, resp. rate 14, height 5\' 4"  (1.626 m), weight 96.6 kg, SpO2 95  %. Body mass index is 36.56 kg/m.  Treatment Plan Summary: Plan Patient denies SI/HI/delusional thought. He is calm and cooperative with auditory hallucinations as presenting problem. Auditory hallucinations are chronic, not acute. Patient reporting AH have been present since teenage years and he is currently established with a community psychiatric provider ([facility] ACT team) for medication management.  This Clinical research associate spoke with Kenneth Jensen, which is the ACT team lead. An intervention was discussed for increased home visits to encourage and monitor patient during medication administrations to better manage symptoms, outpatient. Medications (Seroquel and Depakote) reordered during inpatient observation stay and patient has been compliant with medications thus far, assigned RN. Team lead reported that patient does continue to receive his monthly [place]. Patient will be discharged back to the care of his ACT team. [facility] team lead has offered to provided transportation upon patient discharge today.  Disposition: No evidence of imminent risk to self or others at present.   Patient does not meet criteria for psychiatric inpatient admission.  Mcneil Sober, NP 07/03/2022 1:48 PM

## 2022-07-03 NOTE — BH Assessment (Signed)
Per psych NP Chinwendu, pt is recommended for overnight observation and reassessment in the AM.

## 2022-07-03 NOTE — ED Notes (Signed)
Pt given his personal belongings from locked room and told of discharge. Pt encoruaged to wake up and get dressed. Pt verbalized understanding but did ask why he was "being put out of the hospital".

## 2022-07-03 NOTE — ED Notes (Signed)
Pt. To BHU from ED ambulatory without difficulty, to room  BHU . Report from [facility] Pt. Is alert and oriented, warm and dry in no distress. Pt. Denies SI, HI, and AVH. Pt. Calm and cooperative. Pt. Made aware of security cameras and Q15 minute rounds. Pt. Encouraged to let Nursing staff know of any concerns or needs.   ENVIRONMENTAL ASSESSMENT Potentially harmful objects out of patient reach: Yes.   Personal belongings secured: Yes.   Patient dressed in hospital provided attire only: Yes.   Plastic bags out of patient reach: Yes.   Patient care equipment (cords, cables, call bells, lines, and drains) shortened, removed, or accounted for: Yes.   Equipment and supplies removed from bottom of stretcher: Yes.   Potentially toxic materials out of patient reach: Yes.   Sharps container removed or out of patient reach: Yes.

## 2022-07-03 NOTE — ED Notes (Signed)
Lunch tray provided with water.

## 2022-07-03 NOTE — ED Notes (Signed)
Pt finsihed talking to psych via computer and is requesting food, pt given meal tray at this time.

## 2022-07-03 NOTE — ED Notes (Signed)
ACT team called and informed of pt pending discharge. Estimated to arrive in approx 10 min to transport pt back home.

## 2022-09-03 ENCOUNTER — Inpatient Hospital Stay (HOSPITAL_COMMUNITY)
Admission: AD | Admit: 2022-09-03 | Discharge: 2022-09-09 | DRG: 885 | Disposition: A | Discharge status: Home / Self Care | Payer: Medicare HMO | Source: Intra-hospital | Attending: Psychiatry | Admitting: Psychiatry

## 2022-09-03 ENCOUNTER — Encounter (HOSPITAL_COMMUNITY): Payer: Self-pay

## 2022-09-03 ENCOUNTER — Other Ambulatory Visit: Payer: Self-pay

## 2022-09-03 ENCOUNTER — Emergency Department
Admission: EM | Admit: 2022-09-03 | Discharge: 2022-09-03 | Disposition: A | Discharge status: Other Acute Inpatient Hospital | Payer: Medicare HMO | Attending: Emergency Medicine | Admitting: Emergency Medicine

## 2022-09-03 DIAGNOSIS — E538 Deficiency of other specified B group vitamins: Secondary | ICD-10-CM | POA: Diagnosis not present

## 2022-09-03 DIAGNOSIS — F1210 Cannabis abuse, uncomplicated: Secondary | ICD-10-CM | POA: Diagnosis not present

## 2022-09-03 DIAGNOSIS — F419 Anxiety disorder, unspecified: Secondary | ICD-10-CM | POA: Insufficient documentation

## 2022-09-03 DIAGNOSIS — F1910 Other psychoactive substance abuse, uncomplicated: Secondary | ICD-10-CM | POA: Diagnosis not present

## 2022-09-03 DIAGNOSIS — F1520 Other stimulant dependence, uncomplicated: Secondary | ICD-10-CM | POA: Diagnosis not present

## 2022-09-03 DIAGNOSIS — F1220 Cannabis dependence, uncomplicated: Secondary | ICD-10-CM | POA: Diagnosis present

## 2022-09-03 DIAGNOSIS — F1410 Cocaine abuse, uncomplicated: Secondary | ICD-10-CM | POA: Insufficient documentation

## 2022-09-03 DIAGNOSIS — Y900 Blood alcohol level of less than 20 mg/100 ml: Secondary | ICD-10-CM | POA: Insufficient documentation

## 2022-09-03 DIAGNOSIS — R440 Auditory hallucinations: Secondary | ICD-10-CM | POA: Diagnosis present

## 2022-09-03 DIAGNOSIS — F17210 Nicotine dependence, cigarettes, uncomplicated: Secondary | ICD-10-CM | POA: Insufficient documentation

## 2022-09-03 DIAGNOSIS — Z23 Encounter for immunization: Secondary | ICD-10-CM

## 2022-09-03 DIAGNOSIS — J45909 Unspecified asthma, uncomplicated: Secondary | ICD-10-CM | POA: Insufficient documentation

## 2022-09-03 DIAGNOSIS — F1420 Cocaine dependence, uncomplicated: Secondary | ICD-10-CM | POA: Diagnosis not present

## 2022-09-03 DIAGNOSIS — Z720 Tobacco use: Secondary | ICD-10-CM | POA: Diagnosis not present

## 2022-09-03 DIAGNOSIS — F29 Unspecified psychosis not due to a substance or known physiological condition: Secondary | ICD-10-CM | POA: Diagnosis not present

## 2022-09-03 DIAGNOSIS — F200 Paranoid schizophrenia: Secondary | ICD-10-CM | POA: Diagnosis not present

## 2022-09-03 DIAGNOSIS — F250 Schizoaffective disorder, bipolar type: Secondary | ICD-10-CM | POA: Insufficient documentation

## 2022-09-03 DIAGNOSIS — R7303 Prediabetes: Secondary | ICD-10-CM | POA: Diagnosis present

## 2022-09-03 DIAGNOSIS — Z91199 Patient's noncompliance with other medical treatment and regimen due to unspecified reason: Secondary | ICD-10-CM | POA: Diagnosis not present

## 2022-09-03 DIAGNOSIS — F17200 Nicotine dependence, unspecified, uncomplicated: Secondary | ICD-10-CM | POA: Diagnosis present

## 2022-09-03 DIAGNOSIS — R4589 Other symptoms and signs involving emotional state: Secondary | ICD-10-CM | POA: Diagnosis present

## 2022-09-03 DIAGNOSIS — F1490 Cocaine use, unspecified, uncomplicated: Secondary | ICD-10-CM | POA: Diagnosis not present

## 2022-09-03 DIAGNOSIS — F1229 Cannabis dependence with unspecified cannabis-induced disorder: Secondary | ICD-10-CM | POA: Diagnosis not present

## 2022-09-03 DIAGNOSIS — F515 Nightmare disorder: Secondary | ICD-10-CM | POA: Diagnosis present

## 2022-09-03 DIAGNOSIS — F1290 Cannabis use, unspecified, uncomplicated: Secondary | ICD-10-CM | POA: Diagnosis not present

## 2022-09-03 DIAGNOSIS — Z555 Less than a high school diploma: Secondary | ICD-10-CM

## 2022-09-03 DIAGNOSIS — R9431 Abnormal electrocardiogram [ECG] [EKG]: Secondary | ICD-10-CM | POA: Diagnosis not present

## 2022-09-03 DIAGNOSIS — Z79899 Other long term (current) drug therapy: Secondary | ICD-10-CM

## 2022-09-03 LAB — URINE DRUG SCREEN, QUALITATIVE (ARMC ONLY)
Amphetamines, Ur Screen: NOT DETECTED
Barbiturates, Ur Screen: NOT DETECTED
Benzodiazepine, Ur Scrn: NOT DETECTED
Cannabinoid 50 Ng, Ur ~~LOC~~: POSITIVE — AB
Cocaine Metabolite,Ur ~~LOC~~: POSITIVE — AB
MDMA (Ecstasy)Ur Screen: NOT DETECTED
Methadone Scn, Ur: NOT DETECTED
Opiate, Ur Screen: NOT DETECTED
Phencyclidine (PCP) Ur S: NOT DETECTED
Tricyclic, Ur Screen: NOT DETECTED

## 2022-09-03 LAB — COMPREHENSIVE METABOLIC PANEL
ALT: 15 U/L (ref 0–44)
AST: 20 U/L (ref 15–41)
Albumin: 4.5 g/dL (ref 3.5–5.0)
Alkaline Phosphatase: 62 U/L (ref 38–126)
Anion gap: 12 (ref 5–15)
BUN: 11 mg/dL (ref 6–20)
CO2: 24 mmol/L (ref 22–32)
Calcium: 8.8 mg/dL — ABNORMAL LOW (ref 8.9–10.3)
Chloride: 100 mmol/L (ref 98–111)
Creatinine, Ser: 0.82 mg/dL (ref 0.61–1.24)
GFR, Estimated: >60 mL/min (ref >60)
Glucose, Bld: 106 mg/dL — ABNORMAL HIGH (ref 70–99)
Potassium: 3.5 mmol/L (ref 3.5–5.1)
Sodium: 136 mmol/L (ref 135–145)
Total Bilirubin: 0.5 mg/dL (ref 0.3–1.2)
Total Protein: 7.9 g/dL (ref 6.5–8.1)

## 2022-09-03 LAB — CBC
HCT: 42.7 % (ref 39.0–52.0)
Hemoglobin: 14 g/dL (ref 13.0–17.0)
MCH: 28.8 pg (ref 26.0–34.0)
MCHC: 32.8 g/dL (ref 30.0–36.0)
MCV: 87.9 fL (ref 80.0–100.0)
Platelets: 341 10*3/uL (ref 150–400)
RBC: 4.86 MIL/uL (ref 4.22–5.81)
RDW: 13.1 % (ref 11.5–15.5)
WBC: 12 10*3/uL — ABNORMAL HIGH (ref 4.0–10.5)
nRBC: 0 % (ref 0.0–0.2)

## 2022-09-03 LAB — ACETAMINOPHEN LEVEL: Acetaminophen (Tylenol), Serum: <10 ug/mL — ABNORMAL LOW (ref 10–30)

## 2022-09-03 LAB — SALICYLATE LEVEL: Salicylate Lvl: <7 mg/dL — ABNORMAL LOW (ref 7.0–30.0)

## 2022-09-03 LAB — ETHANOL: Alcohol, Ethyl (B): <10 mg/dL (ref <10)

## 2022-09-03 LAB — VALPROIC ACID LEVEL: Valproic Acid Lvl: 53 ug/mL (ref 50.0–100.0)

## 2022-09-03 MED ORDER — QUETIAPINE FUMARATE 200 MG PO TABS
200.0000 mg | ORAL_TABLET | Freq: Every day | ORAL | Status: DC
  Administered 2022-09-03: 200 mg via ORAL
  Filled 2022-09-03 (×4): qty 1

## 2022-09-03 MED ORDER — ALUM & MAG HYDROXIDE-SIMETH 200-200-20 MG/5ML PO SUSP: 30.0000 mL | ORAL | Status: DC | PRN

## 2022-09-03 MED ORDER — LORAZEPAM 1 MG PO TABS: 1.0000 mg | ORAL_TABLET | Freq: Once | ORAL | Status: DC

## 2022-09-03 MED ORDER — TRAZODONE HCL 50 MG PO TABS
50.0000 mg | ORAL_TABLET | Freq: Every evening | ORAL | Status: DC | PRN
  Administered 2022-09-03 – 2022-09-08 (×6): 50 mg via ORAL
  Filled 2022-09-03 (×2): qty 1
  Filled 2022-09-03: qty 3
  Filled 2022-09-03 (×4): qty 1

## 2022-09-03 MED ORDER — LORAZEPAM 2 MG PO TABS: 2.0000 mg | ORAL_TABLET | Freq: Three times a day (TID) | ORAL | Status: DC | PRN

## 2022-09-03 MED ORDER — TRAZODONE HCL 50 MG PO TABS: 50.0000 mg | ORAL_TABLET | Freq: Every evening | ORAL | Status: DC | PRN

## 2022-09-03 MED ORDER — OLANZAPINE 10 MG IM SOLR: 5.0000 mg | Freq: Two times a day (BID) | INTRAMUSCULAR | Status: DC | PRN

## 2022-09-03 MED ORDER — HYDROXYZINE HCL 25 MG PO TABS: 50.0000 mg | ORAL_TABLET | Freq: Three times a day (TID) | ORAL | Status: DC | PRN

## 2022-09-03 MED ORDER — LORAZEPAM 1 MG PO TABS: 2.0000 mg | ORAL_TABLET | Freq: Three times a day (TID) | ORAL | Status: DC | PRN

## 2022-09-03 MED ORDER — NICOTINE 14 MG/24HR TD PT24
14.0000 mg | MEDICATED_PATCH | Freq: Every day | TRANSDERMAL | Status: DC
  Filled 2022-09-03 (×3): qty 1

## 2022-09-03 MED ORDER — DIPHENHYDRAMINE HCL 50 MG/ML IJ SOLN: 50.0000 mg | Freq: Two times a day (BID) | INTRAMUSCULAR | Status: DC | PRN

## 2022-09-03 MED ORDER — MAGNESIUM HYDROXIDE 400 MG/5ML PO SUSP: 30.0000 mL | Freq: Every day | ORAL | Status: DC | PRN

## 2022-09-03 MED ORDER — OLANZAPINE 5 MG PO TBDP: 5.0000 mg | ORAL_TABLET | Freq: Two times a day (BID) | ORAL | Status: DC | PRN

## 2022-09-03 MED ORDER — QUETIAPINE FUMARATE 200 MG PO TABS: 200.0000 mg | ORAL_TABLET | Freq: Every day | ORAL | Status: DC

## 2022-09-03 MED ORDER — DIPHENHYDRAMINE HCL 25 MG PO CAPS: 50.0000 mg | ORAL_CAPSULE | Freq: Two times a day (BID) | ORAL | Status: DC | PRN

## 2022-09-03 MED ORDER — DIVALPROEX SODIUM ER 250 MG PO TB24
500.0000 mg | ORAL_TABLET | Freq: Three times a day (TID) | ORAL | Status: DC
  Administered 2022-09-03 (×2): 500 mg via ORAL
  Filled 2022-09-03 (×2): qty 2

## 2022-09-03 MED ORDER — LORAZEPAM 2 MG PO TABS: 2.0000 mg | ORAL_TABLET | Freq: Once | ORAL | Status: DC

## 2022-09-03 MED ORDER — NICOTINE 21 MG/24HR TD PT24
21.0000 mg | MEDICATED_PATCH | Freq: Once | TRANSDERMAL | Status: DC
  Administered 2022-09-03: 21 mg via TRANSDERMAL
  Filled 2022-09-03: qty 1

## 2022-09-03 MED ORDER — DIVALPROEX SODIUM ER 500 MG PO TB24
500.0000 mg | ORAL_TABLET | Freq: Three times a day (TID) | ORAL | Status: DC
  Administered 2022-09-04 (×2): 500 mg via ORAL
  Filled 2022-09-03 (×8): qty 1

## 2022-09-03 MED ORDER — DROPERIDOL 2.5 MG/ML IJ SOLN
5.0000 mg | Freq: Once | INTRAMUSCULAR | Status: AC
  Administered 2022-09-03: 5 mg via INTRAMUSCULAR

## 2022-09-03 MED ORDER — LORAZEPAM 2 MG/ML IJ SOLN: 2.0000 mg | Freq: Two times a day (BID) | INTRAMUSCULAR | Status: DC | PRN

## 2022-09-03 MED ORDER — HYDROXYZINE HCL 50 MG PO TABS
50.0000 mg | ORAL_TABLET | Freq: Three times a day (TID) | ORAL | Status: DC | PRN
  Administered 2022-09-03 – 2022-09-08 (×6): 50 mg via ORAL
  Filled 2022-09-03 (×6): qty 1

## 2022-09-03 NOTE — ED Notes (Signed)
IVC Accepted at [facility]  pending transport [phone]

## 2022-09-03 NOTE — Consult Note (Signed)
[facility] Face-to-Face Psychiatry Consult   Reason for Consult:  Psychiatric Evaluation  Referring Physician:  Pilar Jarvis, MD Patient Identification: Kenneth Jensen MRN:  [id number] Principal Diagnosis: Schizoaffective disorder, bipolar type (HCC) Diagnosis:  Principal Problem:   Schizoaffective disorder, bipolar type (HCC) Active Problems:   Auditory hallucinations   Cannabis use disorder, moderate, dependence (HCC)   Cocaine abuse (HCC)   Total Time spent with patient: 30 minutes  Subjective:   Kenneth Jensen is a 32 y.o. male patient with past history significant of schizophrenia, bipolar, substance use presented to the emergency department with acute psychosis, hallucinations and paranoia most likely  exacerbated my medication noncompliance, and substance use. Patient was subsequently placed under involuntary commitment by the emergency department physician due to acute psychiatric impairment.    HPI:  Patient seen face to face by this provider, consulted with emergency department physician D. Wells; and chart reviewed on 09/03/22.  On evaluation, patient observed asleep in a recliner in the hallway, difficult to arouse, but arouses with assistance from familiar staff. Patient reports experiencing stress at home, but does not elaborate on the specific nature of the stress, whether it is related to relationship or familial issues. He says that lives alone in [place] and is currently unemployed, receiving disability benefits for his mental health diagnoses. He reports a known diagnosis of schizophrenia and a history of auditory hallucinations. However, he refuses to share if he is currently experiencing auditory hallucinations and does not respond when asked about visual hallucinations. He admits to non-compliance with his medication, stating that the last time he took it was at the beginning of the month. He says that he receives outpatient psychiatry services through [facility] and has an  ACT team. He endorses occasional thoughts of suicidal ideation but does not respond to further questioning.  During the evaluation, patient appears to be  responding to internal stimuli, exhibits flight of ideas and delayed responses. At one point he pulls the covers over his head but is easily redirected. Currently, he exhibits signs of acute psychosis, unresponsive to questions, somewhat difficult to engage, and appears paranoid. Urine Drug Screen is positive for cocaine and marijuana. Blood Alcohol Level (BAL) is unremarkable.   Disposition: Recommend psychiatric inpatient admission for stabilization when medically cleared.   Past Psychiatric History: Bipolar 1 disorder (HCC), Depression, Schizophrenia   Risk to Self:   Risk to Others:   Prior Inpatient Therapy:   Prior Outpatient Therapy:    Past Medical History:  Past Medical History:  Diagnosis Date   Asthma    Bipolar 1 disorder (HCC)    Depression    Schizophrenia (HCC)    Schizotaxia    History reviewed. No pertinent surgical history. Family History: History reviewed. No pertinent family history. Family Psychiatric  History: Unknown  Social History:  Social History   Substance and Sexual Activity  Alcohol Use Yes   Comment: social     Social History   Substance and Sexual Activity  Drug Use Yes   Types: Marijuana, Cocaine    Social History   Socioeconomic History   Marital status: Single    Spouse name: Not on file   Number of children: Not on file   Years of education: Not on file   Highest education level: Not on file  Occupational History   Not on file  Tobacco Use   Smoking status: Every Day    Current packs/day: 0.50    Types: Cigarettes   Smokeless tobacco: Never  Substance  and Sexual Activity   Alcohol use: Yes    Comment: social   Drug use: Yes    Types: Marijuana, Cocaine   Sexual activity: Not Currently  Other Topics Concern   Not on file  Social History Narrative   ** Merged History  Encounter **       Social Determinants of Health   Financial Resource Strain: Unknown (01/30/2018)   Received [facility], [facility]   Overall Financial Resource Strain (CARDIA)    Difficulty of Paying Living Expenses: Patient declined  Food Insecurity: Unknown (01/30/2018)   Received from [facility], [facility]   Hunger Vital Sign    Worried About Running Out of Food in the Last Year: Patient declined    Ran Out of Food in the Last Year: Patient declined  Transportation Needs: Unknown (01/30/2018)   Received from [facility], [facility]   [facility] - Transportation    In the past 12 months, has lack of transportation kept you from medical appointments or from getting medications?: Patient declined    Lack of Transportation (Non-Medical): Patient declined  Physical Activity: Unknown (01/30/2018)   Received from [facility], [facility]   Exercise Vital Sign    Days of Exercise per Week: Patient declined    Minutes of Exercise per Session: Patient declined  Stress: Unknown (01/30/2018)   Received from [facility], [facility]   [employer] [facility] - Occupational Stress Questionnaire    Feeling of Stress : Patient declined  Social Connections: Unknown (01/30/2018)   Received from [facility], [facility]   Social Connection and Isolation Panel [NHANES]    Frequency of Communication with Friends and Family: Patient declined    Frequency of Social Gatherings with Friends and Family: Patient declined    Attends Religious Services: Patient declined    Active Member of Clubs or Organizations: Patient declined    Attends Banker Meetings: Patient declined    Marital Status: Patient declined   Additional Social History:    Allergies:    Allergies  Allergen Reactions   Penicillins Anaphylaxis   Amoxicillin Swelling   Haldol [Haloperidol]     Pt states "I went crazy"   Percocet [Oxycodone-Acetaminophen]    Vicodin [Hydrocodone-Acetaminophen] Itching   Vicodin [Hydrocodone-Acetaminophen]     Labs:  Results for orders placed or performed during the hospital encounter of 09/03/22 (from the past 48 hour(s))  Comprehensive metabolic panel     Status: Abnormal   Collection Time: 09/03/22  1:08 AM  Result Value Ref Range   Sodium 136 135 - 145 mmol/L   Potassium 3.5 3.5 - 5.1 mmol/L   Chloride 100 98 - 111 mmol/L   CO2 24 22 - 32 mmol/L   Glucose, Bld 106 (H) 70 - 99 mg/dL    Comment: Glucose reference range applies only to samples taken after fasting for at least 8 hours.   BUN 11 6 - 20 mg/dL   Creatinine, Ser 8.41 0.61 - 1.24 mg/dL   Calcium 8.8 (L) 8.9 - 10.3 mg/dL   Total Protein 7.9 6.5 - 8.1 g/dL   Albumin 4.5 3.5 - 5.0 g/dL   AST 20 15 - 41 U/L   ALT 15 0 - 44 U/L   Alkaline Phosphatase 62 38 - 126 U/L   Total Bilirubin 0.5 0.3 - 1.2 mg/dL  GFR, Estimated >60 >60 mL/min    Comment: (NOTE) Calculated using the CKD-EPI Creatinine Equation (2021)    Anion gap 12 5 - 15    Comment: Performed at [facility], [address] [place], [place] [address]  Ethanol     Status: None   Collection Time: 09/03/22  1:08 AM  Result Value Ref Range   Alcohol, Ethyl (B) <10 <10 mg/dL    Comment: (NOTE) Lowest detectable limit for serum alcohol is 10 mg/dL.  For medical purposes only. Performed at [facility], [address] [place], [place] [address]   Salicylate level     Status: Abnormal   Collection Time: 09/03/22  1:08 AM  Result Value Ref Range   Salicylate Lvl <7.0 (L) 7.0 - 30.0 mg/dL    Comment: Performed at [facility], [address] [place], [place] [address]  Acetaminophen level     Status: Abnormal   Collection Time: 09/03/22  1:08 AM  Result Value Ref Range    Acetaminophen (Tylenol), Serum <10 (L) 10 - 30 ug/mL    Comment: (NOTE) Therapeutic concentrations vary significantly. A range of 10-30 ug/mL  may be an effective concentration for many patients. However, some  are best treated at concentrations outside of this range. Acetaminophen concentrations >150 ug/mL at 4 hours after ingestion  and >50 ug/mL at 12 hours after ingestion are often associated with  toxic reactions.  Performed at [facility], [address] [place], [place] [address]   cbc     Status: Abnormal   Collection Time: 09/03/22  1:08 AM  Result Value Ref Range   WBC 12.0 (H) 4.0 - 10.5 K/uL   RBC 4.86 4.22 - 5.81 MIL/uL   Hemoglobin 14.0 13.0 - 17.0 g/dL   HCT 86.5 78.4 - 69.6 %   MCV 87.9 80.0 - 100.0 fL   MCH 28.8 26.0 - 34.0 pg   MCHC 32.8 30.0 - 36.0 g/dL   RDW 29.5 28.4 - 13.2 %   Platelets 341 150 - 400 K/uL   nRBC 0.0 0.0 - 0.2 %    Comment: Performed at [facility], [address] [place], [place] [address]  Valproic acid level     Status: None   Collection Time: 09/03/22  1:08 AM  Result Value Ref Range   Valproic Acid Lvl 53 50.0 - 100.0 ug/mL    Comment: Performed at [facility], [address] [place], [place] [address]  Urine Drug Screen, Qualitative     Status: Abnormal   Collection Time: 09/03/22  1:12 AM  Result Value Ref Range   Tricyclic, Ur Screen NONE DETECTED NONE DETECTED   Amphetamines, Ur Screen NONE DETECTED NONE DETECTED   MDMA (Ecstasy)Ur Screen NONE DETECTED NONE DETECTED   Cocaine Metabolite,Ur [place] POSITIVE (A) NONE DETECTED   Opiate, Ur Screen NONE DETECTED NONE DETECTED   Phencyclidine (PCP) Ur S NONE DETECTED NONE DETECTED   Cannabinoid 50 Ng, Ur [place] POSITIVE (A) NONE DETECTED   Barbiturates, Ur Screen NONE DETECTED NONE DETECTED   Benzodiazepine, Ur Scrn NONE DETECTED NONE DETECTED   Methadone Scn, Ur NONE DETECTED NONE DETECTED    Comment: (NOTE) Tricyclics + metabolites, urine    Cutoff 1000  ng/mL Amphetamines + metabolites, urine  Cutoff 1000 ng/mL MDMA (Ecstasy), urine              Cutoff 500 ng/mL Cocaine Metabolite, urine          Cutoff 300  ng/mL Opiate + metabolites, urine        Cutoff 300 ng/mL Phencyclidine (PCP), urine         Cutoff 25 ng/mL Cannabinoid, urine                 Cutoff 50 ng/mL Barbiturates + metabolites, urine  Cutoff 200 ng/mL Benzodiazepine, urine              Cutoff 200 ng/mL Methadone, urine                   Cutoff 300 ng/mL  The urine drug screen provides only a preliminary, unconfirmed analytical test result and should not be used for non-medical purposes. Clinical consideration and professional judgment should be applied to any positive drug screen result due to possible interfering substances. A more specific alternate chemical method must be used in order to obtain a confirmed analytical result. Gas chromatography / mass spectrometry (GC/MS) is the preferred confirm atory method. Performed at [facility], [address] [place], [place] [address]     Current Facility-Administered Medications  Medication Dose Route Frequency Provider Last Rate Last Admin   diphenhydrAMINE (BENADRYL) capsule 50 mg  50 mg Oral BID PRN Kaylina Cahue H, NP       Or   diphenhydrAMINE (BENADRYL) injection 50 mg  50 mg Intramuscular BID PRN Langley Flatley H, NP       divalproex (DEPAKOTE ER) 24 hr tablet 500 mg  500 mg Oral TID Ormond Lazo H, NP   500 mg at 09/03/22 1000   hydrOXYzine (ATARAX) tablet 50 mg  50 mg Oral TID PRN Taejah Ohalloran H, NP       LORazepam (ATIVAN) tablet 2 mg  2 mg Oral TID PRN Sinthia Karabin H, NP       Or   LORazepam (ATIVAN) injection 2 mg  2 mg Intramuscular BID PRN Jajuan Skoog H, NP       OLANZapine zydis (ZYPREXA) disintegrating tablet 5 mg  5 mg Oral BID PRN Demica Zook H, NP       Or   OLANZapine (ZYPREXA) injection 5 mg  5 mg Intramuscular BID PRN Renalda Locklin H, NP        QUEtiapine (SEROQUEL) tablet 200 mg  200 mg Oral QHS Takita Riecke H, NP       traZODone (DESYREL) tablet 50 mg  50 mg Oral QHS PRN Dontre Laduca H, NP       Current Outpatient Medications  Medication Sig Dispense Refill   divalproex (DEPAKOTE ER) 500 MG 24 hr tablet Take 1 tablet (500 mg total) by mouth 3 (three) times daily. 90 tablet 1   paliperidone (INVEGA SUSTENNA) 234 MG/1.5ML injection Inject 234 mg into the muscle every 28 (twenty-eight) days. 1.8 mL 1   QUEtiapine (SEROQUEL) 200 MG tablet Take 200 mg by mouth at bedtime.      Musculoskeletal: Strength & Muscle Tone: within normal limits Gait & Station:  Did not assess  Patient leans: N/A            Psychiatric Specialty Exam:  Presentation  General Appearance:  Disheveled  Eye Contact: Minimal  Speech: Slow  Speech Volume: Normal  Handedness: Right   Mood and Affect  Mood: Irritable  Affect: Labile   Thought Process  Thought Processes: Goal Directed  Descriptions of Associations:Tangential  Orientation:Full (Time, Place and Person)  Thought Content:Paranoid Ideation  History of Schizophrenia/Schizoaffective disorder:Yes  Duration of Psychotic Symptoms:Greater than six months  Hallucinations:Hallucinations: Auditory Description of Auditory Hallucinations: He does not share  Ideas of Reference:Paranoia  Suicidal Thoughts:Suicidal Thoughts: No  Homicidal Thoughts:Homicidal Thoughts: No   Sensorium  Memory: Immediate Fair; Recent Fair  Judgment: Poor  Insight: Poor   Executive Functions  Concentration: Fair  Attention Span: Fair  Recall: Fair  Fund of Knowledge: Fair  Language: Fair   Psychomotor Activity  Psychomotor Activity:Psychomotor Activity: Psychomotor Retardation   Assets  Assets: Housing; Physical Health   Sleep  Sleep:Sleep: Fair   Physical Exam: Physical Exam Vitals and nursing note reviewed. Exam conducted with a chaperone  present.  Constitutional:      General: He is not in acute distress. HENT:     Head: Normocephalic.     Nose: Nose normal.  Cardiovascular:     Pulses: Normal pulses.  Pulmonary:     Effort: Pulmonary effort is normal. No respiratory distress.  Musculoskeletal:        General: Normal range of motion.     Cervical back: Normal range of motion.  Neurological:     Mental Status: He is alert and oriented to person, place, and time.    Review of Systems  Respiratory:  Negative for shortness of breath.   Psychiatric/Behavioral:  Positive for hallucinations and substance abuse. The patient is nervous/anxious.   All other systems reviewed and are negative.  Blood pressure 125/69, pulse 75, temperature 98.2 F (36.8 C), temperature source Oral, resp. rate 18, height 5\' 4"  (1.626 m), weight 90.7 kg, SpO2 92%. Body mass index is 34.33 kg/m.  Treatment Plan Summary: Daily contact with patient to assess and evaluate symptoms and progress in treatment, Medication management, and Plan : Inpatient psychiatric hospitalization recommended for safety and stabilization.   BH Agitation Protocol: (patient has listed allergy to haloperidol) -Olanzapine 5 mg oral or IM two times daily as needed.  -Ativan 2 mg oral or IM three times daily as needed.  -Diphenhydramine 50 mg two times daily as needed  Medication Management:  -Restart Quetiapine 200 mg daily at bedtime -Restart Depakote 500 mg three times daily -Start hydroxyzine, 25 mg, three times a day to manage anxiety symptoms -Start trazodone, 25 mg at bedtime as needed for sleep support.  Labs/Tests Ordered:  -EKG  -Depakote level    Norma Fredrickson, NP 09/03/2022 12:54 PM

## 2022-09-03 NOTE — ED Notes (Addendum)
Pt reports cocaine use earlier today.   Pt states "I just wanna be here before my whole temper blows up."

## 2022-09-03 NOTE — ED Notes (Signed)
Pt ambulated to restroom without issue. While pt was in restroom, this RN heard noises that sounded like vomiting and check

## 2022-09-03 NOTE — BH Assessment (Signed)
Adult MH  Referral information for Psychiatric Hospitalization faxed to:   [facility] ([phone]-or- [phone]),   [facility] ([phone]),   [facility] ([phone] -or- [phone]),   [facility] ([phone]---[phone]---[phone]),   [facility] ([phone] or [phone])   [facility] ([phone] or [phone]),   [facility] ([phone]) 

## 2022-09-03 NOTE — BH Assessment (Signed)
Comprehensive Clinical Assessment (CCA) Note  09/03/2022 Kenneth Jensen [id number]  Kenneth Jensen, 32 year old male who presents to [facility] ED involuntarily for treatment. Per triage note, Pt states "I need to sleep in the hospital for a couple days for my schizophrenia." Pt expressing paranoid thoughts. Pt denies SI but reports HI towards uncle stating "I was going to punch my uncle in between his face, and he's shorter than me too." Pt difficult to redirect to answer questions and speaking with flight of ideas. I believe pt is responding to internal stimuli and speaking some to people that are not present. Pt denies physical pain. Pt ambulatory to triage. Pt alert and following commands. Cooperative at this time.   During TTS assessment pt presents alert and oriented x 4, restless but cooperative, and mood-congruent with affect. Pt appears to be responding to internal stimuli. Pt is not presenting with delusional thinking. Pt verified the information provided to triage RN.   Pt identifies his main complaint to be that he is stressed. Patient reports he lives alone but wants to move to [place]. Patient is not currently working but receives disability for his mental health. Patient reports having an ACT Team but says they rarely come to his home.  Upon evaluation, patient is responding to internal stimuli with slow response to interview questions, paranoid and staring at [facility]. Patient states he is non-compliant with his medications and recalls taking his medicine at the beginning of the month. Patient UDS is positive for cocaine and marijuana. Pt endorses passive SI/AH/VH.    Per Christal, NP, pt is recommended for inpatient psychiatric admission.    Chief Complaint:  Chief Complaint  Patient presents with   Psychiatric Evaluation   Visit Diagnosis: Schizoaffective disorder, bipolar type    CCA Screening, Triage and Referral (STR)  Patient Reported Information How did you hear  about [place]? Self  Referral name: No data recorded Referral phone number: No data recorded  Whom do you see for routine medical problems? No data recorded Practice/Facility Name: No data recorded Practice/Facility Phone Number: No data recorded Name of Contact: No data recorded Contact Number: No data recorded Contact Fax Number: No data recorded Prescriber Name: No data recorded Prescriber Address (if known): No data recorded  What Is the Reason for Your Visit/Call Today? Patient reports being stressed.  How Long Has This Been Causing You Problems? > than 6 months  What Do You Feel Would Help You the Most Today? Treatment for Depression or other mood problem; Medication(s)   Have You Recently Been in Any Inpatient Treatment (Hospital/Detox/Crisis Center/28-Day Program)? No data recorded Name/Location of Program/Hospital:No data recorded How Long Were You There? No data recorded When Were You Discharged? No data recorded  Have You Ever Received Services From [facility] Before? No data recorded Who Do You See at [facility]? No data recorded  Have You Recently Had Any Thoughts About Hurting Yourself? No  Are You Planning to Commit Suicide/Harm Yourself At This time? No   Have you Recently Had Thoughts About Hurting Someone [name]? No  Explanation: n/a   Have You Used Any Alcohol or Drugs in the Past 24 Hours? Yes  How Long Ago Did You Use Drugs or Alcohol? No data recorded What Did You Use and How Much? Cocaine   Do You Currently Have a Therapist/Psychiatrist? Yes  Name of Therapist/Psychiatrist: ACT Team   Have You Been Recently Discharged From Any Office Practice or Programs? No  Explanation of Discharge From  Practice/Program: n/a     CCA Screening Triage Referral Assessment Type of Contact: Face-to-Face  Is this Initial or Reassessment? No data recorded Date Telepsych consult ordered in [facility]:  No data recorded Time Telepsych consult ordered in [facility]:  No data  recorded  Patient Reported Information Reviewed? No data recorded Patient Left Without Being Seen? No data recorded Reason for Not Completing Assessment: Patient was unable to provide coherent information.   Collateral Involvement: None provided   Does Patient Have a Automotive engineer Guardian? No data recorded Name and Contact of Legal Guardian: No data recorded If Minor and Not Living with Parent(s), Who has Custody? n/a  Is CPS involved or ever been involved? Never  Is APS involved or ever been involved? Never   Patient Determined To Be At Risk for Harm To Self or Others Based on Review of Patient Reported Information or Presenting Complaint? No  Method: No Plan  Availability of Means: No access or NA  Intent: Vague intent or NA  Notification Required: No need or identified person  Additional Information for Danger to Others Potential: -- (n/a)  Additional Comments for Danger to Others Potential: n/a  Are There Guns or Other Weapons in Your Home? No  Types of Guns/Weapons: n/a  Are These Weapons Safely Secured?                            -- (n/a)  Who Could Verify You Are Able To Have These Secured: n/a  Do You Have any Outstanding Charges, Pending Court Dates, Parole/Probation? None reported  Contacted To Inform of Risk of Harm To Self or Others: Other: Comment (n/a)   Location of Assessment: [facility] ED   Does Patient Present under Involuntary Commitment? No  IVC Papers Initial File Date: No data recorded  [place] of Residence: [place]   Patient Currently Receiving the Following Services: Medication Management; ACTT (Assertive Community Treatment)   Determination of Need: Emergent (2 hours)   Options For Referral: ED Visit; Inpatient Hospitalization; Medication Management; Intensive Outpatient Therapy     CCA Biopsychosocial Intake/Chief Complaint:  No data recorded Current Symptoms/Problems: No data recorded  Patient Reported  Schizophrenia/Schizoaffective Diagnosis in Past: Yes   Strengths: Some insight, seeking help, receive treatment services.  Preferences: No data recorded Abilities: No data recorded  Type of Services Patient Feels are Needed: No data recorded  Initial Clinical Notes/Concerns: No data recorded  Mental Health Symptoms Depression:   Difficulty Concentrating; Fatigue; Hopelessness   Duration of Depressive symptoms:  Greater than two weeks   Mania:   Change in energy/activity   Anxiety:    Difficulty concentrating   Psychosis:   Hallucinations   Duration of Psychotic symptoms:  Greater than six months   Trauma:   N/A   Obsessions:   N/A   Compulsions:   N/A   Inattention:   N/A   Hyperactivity/Impulsivity:   N/A   Oppositional/Defiant Behaviors:   N/A   Emotional Irregularity:   N/A   Other Mood/Personality Symptoms:  No data recorded   Mental Status Exam Appearance and self-care  Stature:   Average   Weight:   Average weight   Clothing:   Disheveled   Grooming:   Neglected   Cosmetic use:   None   Posture/gait:   Normal   Motor activity:   Not Remarkable   Sensorium  Attention:   Distractible; Confused   Concentration:   Focuses on irrelevancies; Preoccupied  Orientation:   Person; Place; Object   Recall/memory:   Normal   Affect and Mood  Affect:   Depressed   Mood:   Depressed   Relating  Eye contact:   Staring   Facial expression:   Depressed   Attitude toward examiner:   Cooperative   Thought and Language  Speech flow:  Flight of Ideas   Thought content:   Suspicious; Appropriate to Mood and Circumstances   Preoccupation:   None   Hallucinations:   Auditory   Organization:  No data recorded  [employer] of Knowledge:   Fair   Intelligence:   Average   Abstraction:   Functional   Judgement:   Fair   Dance movement psychotherapist:   Distorted   Insight:   Poor   Decision Making:    Normal   Social Functioning  Social Maturity:   Impulsive; Responsible   Social Judgement:   Chemical engineer"; Heedless   Stress  Stressors:   Housing; Surveyor, quantity; Transitions   Coping Ability:   Exhausted; Deficient supports   Skill Deficits:   Self-care   Supports:   Friends/Service system     Religion:    Leisure/Recreation:    Exercise/Diet:     CCA Employment/Education Employment/Work Situation: Employment / Work Systems developer: On disability Why is Patient on Disability: Schizophrenia How Long has Patient Been on Disability: Unknown Has Patient ever Been in the [employer]?: No  Education: Education Is Patient Currently Attending School?: No   CCA Family/Childhood History Family and Relationship History: Family history Marital status: Single Does patient have children?: No  Childhood History:     Child/Adolescent Assessment:     CCA Substance Use Alcohol/Drug Use: Alcohol / Drug Use Pain Medications: See PTA Prescriptions: See PTA Over the Counter: See PTA History of alcohol / drug use?: Yes Longest period of sobriety (when/how long): Unable to quantify Negative Consequences of Use: Personal relationships Withdrawal Symptoms: None Substance #1 Name of Substance 1: Cocaine                       ASAM's:  Six Dimensions of Multidimensional Assessment  Dimension 1:  Acute Intoxication and/or Withdrawal Potential:   Dimension 1:  Description of individual's past and current experiences of substance use and withdrawal: Pt has a hx of and current use of cannabis  Dimension 2:  Biomedical Conditions and Complications:      Dimension 3:  Emotional, Behavioral, or Cognitive Conditions and Complications:  Dimension 3:  Description of emotional, behavioral, or cognitive conditions and complications: Schizoaffective disorder, bipolar type  Dimension 4:  Readiness to Change:     Dimension 5:  Relapse, Continued use, or  Continued Problem Potential:     Dimension 6:  Recovery/Living Environment:  Dimension 6:  Recovery/Iiving environment criteria description: Pt identified living environment as a severe stressor.  ASAM Severity Score: ASAM's Severity Rating Score: 13  ASAM Recommended Level of Treatment:     Substance use Disorder (SUD) Substance Use Disorder (SUD)  Checklist Symptoms of Substance Use: Continued use despite having a persistent/recurrent physical/psychological problem caused/exacerbated by use, Continued use despite persistent or recurrent social, interpersonal problems, caused or exacerbated by use, Presence of craving or strong urge to use  Recommendations for Services/Supports/Treatments: Recommendations for Services/Supports/Treatments Recommendations For Services/Supports/Treatments: ACCTT (Assertive Community Treatment), Medication Management, Inpatient Hospitalization  DSM5 Diagnoses: Patient Active Problem List   Diagnosis Date Noted   Vitamin D deficiency, unspecified 06/08/2022   Mixed  hyperlipidemia 06/08/2022   Cocaine abuse (HCC) 11/22/2021   Prediabetes 05/16/2020   Auditory hallucinations    Schizoaffective disorder, bipolar type (HCC) 03/25/2019   Cannabis use disorder, moderate, dependence (HCC) 04/21/2011   Tobacco use disorder 04/21/2011    Patient Centered Plan: Patient is on the following Treatment Plan(s):  Depression and Substance Abuse   Referrals to Alternative Service(s): Referred to Alternative Service(s):   Place:   Date:   Time:    Referred to Alternative Service(s):   Place:   Date:   Time:    Referred to Alternative Service(s):   Place:   Date:   Time:    Referred to Alternative Service(s):   Place:   Date:   Time:      @BHCOLLABOFCARE @  Randye Treichler R Theatre manager, Counselor, LCAS-A

## 2022-09-03 NOTE — ED Notes (Signed)
Pt given lunch tray and beverage 

## 2022-09-03 NOTE — ED Triage Notes (Signed)
Pt states "I need to sleep in the hospital for a couple days for my schizophrenia." Pt expressing paranoid thoughts. Pt denies SI but reports HI towards uncle stating "I was going to punch my uncle in between his face, and he's shorter than me too." Pt difficult to redirect to answer questions and speaking with flight of ideas. I believe pt is responding to internal stimuli and speaking some to people that are not present. Pt denies physical pain. Pt ambulatory to triage. Pt alert and following commands. Cooperative at this time.

## 2022-09-03 NOTE — Progress Notes (Signed)
   09/03/22 2015  Psych Admission Type (Psych Patients Only)  Admission Status Involuntary  Psychosocial Assessment  Patient Complaints Anxiety;Insomnia  Eye Contact Brief  Facial Expression Flat  Affect Appropriate to circumstance  Speech Logical/coherent;Slow  Interaction Assertive  Motor Activity Slow  Appearance/Hygiene In scrubs  Behavior Characteristics Cooperative;Appropriate to situation;Anxious  Mood Anxious;Pleasant  Thought Process  Coherency WDL  Content WDL  Delusions None reported or observed  Perception WDL  Hallucination None reported or observed  Judgment WDL  Confusion None  Danger to Self  Current suicidal ideation? Denies (denies)  Agreement Not to Harm Self Yes  Description of Agreement verbal  Danger to Others  Danger to Others None reported or observed   Progress note   D: Pt seen in his room. Pt denies SI, HI, AVH. Pt rates pain  0/10. Pt rates anxiety  5/10 and depression  0/10. Pt says he is compliant with medication, with last dose being today.  Pt states that he has been having a lot of medication changes lately and these changes have kept him from sleeping. Pt lives alone and counts his uncle, Kenneth Jensen, as his social support. He is stressed by the medication changes and that his mother is living in [place]. He said he would move there but his payee tells him that he doesn't have enough money. Pt is calm and cooperative. No other concerns noted at this time.  A: Pt provided support and encouragement. Pt given scheduled medication as prescribed. PRNs as appropriate. Q15 min checks for safety.   R: Pt safe on the unit. Will continue to monitor.

## 2022-09-03 NOTE — Tx Team (Signed)
Initial Treatment Plan 09/03/2022 9:35 PM [name] RJJ:[id number]    PATIENT STRESSORS: Financial difficulties   Medication change or noncompliance   Other: mom lives in [place] and he wants to move there      PATIENT STRENGTHS: Ability for insight  Average or above average intelligence  Capable of independent living  Motivation for treatment/growth  Supportive family/friends    PATIENT IDENTIFIED PROBLEMS: Medication change (pt compliant)  Insomnia  HI towards uncle (on admission)  Hallucinations   Paranoia  ("Pt states he has insomnia d/t frequent med changes")           DISCHARGE CRITERIA:  Improved stabilization in mood, thinking, and/or behavior Motivation to continue treatment in a less acute level of care Verbal commitment to aftercare and medication compliance  PRELIMINARY DISCHARGE PLAN: Attend aftercare/continuing care group Return to previous living arrangement  PATIENT/FAMILY INVOLVEMENT: This treatment plan has been presented to and reviewed with the patient, Kenneth Jensen, and/or family member.  The patient and family have been given the opportunity to ask questions and make suggestions.  Victorino December, RN 09/03/2022, 9:35 PM

## 2022-09-03 NOTE — BH Assessment (Signed)
Attempted to assess patient but patient appears paranoid, a bit agitated, and responding to internal stimuli could be due to the effects of his cocaine use, will allow the patient to get some sleep. Psyc team to follow.

## 2022-09-03 NOTE — BH Assessment (Signed)
Patient is unwilling to participate in assessment with psych and TTS  at this time.  Will follow-up with patient when patient becomes more cooperative.

## 2022-09-03 NOTE — ED Notes (Signed)
Hospital meal provided.  100% consumed, pt tolerated w/o complaints.  Waste discarded appropriately.   

## 2022-09-03 NOTE — ED Notes (Signed)
Pt received snack. 

## 2022-09-03 NOTE — ED Notes (Signed)
Patient changed out into hospital safe scrubs at this time with this RN and EDT Byrd Hesselbach as witness. Pt belongings placed in hospital safe location. Patient belongings as follows:  1 pair sock 1 pair shoes 1 pair pants 1 shirt 1 sweatshirt 1 set keys 1 wallet  1 phone 1 charger 1 bible

## 2022-09-03 NOTE — ED Notes (Signed)
EMTALA reviewed by this RN.  

## 2022-09-03 NOTE — ED Notes (Signed)
Accepted at [facility]  pending transport [phone]

## 2022-09-03 NOTE — BHH Group Notes (Signed)
Pt just arrived an unit and did not attend wrap up group

## 2022-09-03 NOTE — ED Notes (Signed)
TTS at bedside. 

## 2022-09-03 NOTE — ED Provider Notes (Signed)
[facility] Provider Note    Event Date/Time   First MD Initiated Contact with Patient 09/03/22 0113     (approximate)   History   Psychiatric Evaluation   HPI  Kenneth Jensen is a 32 y.o. male   Past medical history of bipolar, schizophrenia, substance use who presents emerged apartment with psychosis, hallucinations, voluntarily asking for psychiatric help.  He has been compliant with medications.  He says that he is paranoid, "I want to punch my uncle and his face, he shorter than me to", speaking with a flight of ideas, responding to internal stimuli, speaking to people who are not present.  He denies SI HI.  He has no other acute medical complaints.  To me that he states that he did not use any substances or alcohol.  However to our nursing staff he does admit to cocaine use earlier.   External Medical Documents Reviewed: Behavioral health notes from visits in June 2024 for auditory hallucinations.      Physical Exam   Triage Vital Signs: ED Triage Vitals [09/03/22 0103]  Encounter Vitals Group     BP (!) 131/98     Systolic BP Percentile      Diastolic BP Percentile      Pulse Rate 98     Resp 18     Temp 98.1 F (36.7 C)     Temp Source Oral     SpO2 95 %     Weight 200 lb (90.7 kg)     Height 5\' 4"  (1.626 m)     Head Circumference      Peak Flow      Pain Score 0     Pain Loc      Pain Education      Exclude from Growth Chart     Most recent vital signs: Vitals:   09/03/22 0103  BP: (!) 131/98  Pulse: 98  Resp: 18  Temp: 98.1 F (36.7 C)  SpO2: 95%    General: Awake, no distress.  CV:  Good peripheral perfusion.  Resp:  Normal effort.  Abd:  No distention.  Other:  Awake alert comfortable appearing.  Cooperative.  Normal vital signs.  No signs of trauma.  Flight of ideas difficult to redirect, hyperreligiosity and speech.   ED Results / Procedures / Treatments   Labs (all labs ordered are listed, but only  abnormal results are displayed) Labs Reviewed  COMPREHENSIVE METABOLIC PANEL - Abnormal; Notable for the following components:      Result Value   Glucose, Bld 106 (*)    Calcium 8.8 (*)    All other components within normal limits  CBC - Abnormal; Notable for the following components:   WBC 12.0 (*)    All other components within normal limits  URINE DRUG SCREEN, QUALITATIVE ([facility] ONLY) - Abnormal; Notable for the following components:   Cocaine Metabolite,Ur [place] POSITIVE (*)    Cannabinoid 50 Ng, Ur [place] POSITIVE (*)    All other components within normal limits  ETHANOL  SALICYLATE LEVEL  ACETAMINOPHEN LEVEL     I ordered and reviewed the above labs they are notable for cocaine and cannabinoid positive on urine drug testing, mild white blood cell count elevation of 12   PROCEDURES:  Critical Care performed: No  Procedures   MEDICATIONS ORDERED IN ED: Medications - No data to display  IMPRESSION / MDM / ASSESSMENT AND PLAN / ED COURSE  I reviewed the triage vital  signs and the nursing notes.                                Patient's presentation is most consistent with acute presentation with potential threat to life or bodily function.  Differential diagnosis includes, but is not limited to, acute psychosis, drug-induced mood disorder   The patient is on the cardiac monitor to evaluate for evidence of arrhythmia and/or significant heart rate changes.  MDM: Patient with psychiatric history, also positive for cocaine, here with psychiatric evaluation requesting help voluntarily exhibiting signs of acute psychosis.  No other acute medical complaints and reviewed basic labs and toxicologic labs, cleared for psychiatric evaluation.  IVC given his acute psychosis.       FINAL CLINICAL IMPRESSION(S) / ED DIAGNOSES   Final diagnoses:  Psychosis, unspecified psychosis type (HCC)  Cocaine use     Rx / DC Orders   ED Discharge Orders     None        Note:   This document was prepared using Dragon voice recognition software and may include unintentional dictation errors.    Pilar Jarvis, MD 09/03/22 [phone]

## 2022-09-03 NOTE — ED Notes (Signed)
Pt Received a shower

## 2022-09-03 NOTE — ED Notes (Signed)
Pt received dinner. 

## 2022-09-03 NOTE — ED Notes (Signed)
Pt ate 100% of meal and tolerated well.

## 2022-09-03 NOTE — ED Notes (Signed)
Called c com for transport by sheriff's transport  [phone]

## 2022-09-03 NOTE — Progress Notes (Signed)
Pt A X O to self, place and situation. Denies SI, HI, AVH at this time. Vitals done, WNL.Skin assessment done, no areas of break down to note. Tattoos scattered all over pt's body. Pt ambulatory to unit with a steady gait. Unit orientation done, routines discussed; understanding verbalized. Sandwich tray and fluids given, tolerated. Safety checks initiated at Q 15 minutes intervals without incident thus far. Encouraged to voice concerns, denies at this time.

## 2022-09-03 NOTE — ED Notes (Signed)
Pt requested shower; provided clean hospital clothing and linens.  Shower setup provided with soap, shampoo, toothbrush/toothpaste, and deodorant.  Pt able to preform own ADL's with no assistance.    

## 2022-09-03 NOTE — BH Assessment (Signed)
Patient has been accepted to [facility] on today 09/03/22. Patient assigned to room 508, bed# 1. Accepting physician is Dr. Sherron Flemings.  Call report to [phone].  Representative was [employer].   ER Staff is aware of it:  [place], ER Secretary  Dr. Erma Heritage, ER MD  Connye Burkitt, Patient's Nurse     Patient's Family/Support System Tanna Furry [phone]) has been updated as well.

## 2022-09-03 NOTE — ED Notes (Signed)
Pt provided a larger size hospital scrub top and pants due to pts complaint of previously provided attire being "too tight."

## 2022-09-03 NOTE — ED Provider Notes (Signed)
Patient accepted to [facility]. Labs/vitals stable.   Shaune Pollack, MD 09/03/22 [phone]

## 2022-09-04 ENCOUNTER — Telehealth: Payer: Self-pay | Admitting: Family

## 2022-09-04 DIAGNOSIS — F1420 Cocaine dependence, uncomplicated: Secondary | ICD-10-CM | POA: Insufficient documentation

## 2022-09-04 DIAGNOSIS — Z720 Tobacco use: Secondary | ICD-10-CM | POA: Diagnosis not present

## 2022-09-04 DIAGNOSIS — F200 Paranoid schizophrenia: Secondary | ICD-10-CM | POA: Diagnosis not present

## 2022-09-04 DIAGNOSIS — F1290 Cannabis use, unspecified, uncomplicated: Secondary | ICD-10-CM | POA: Diagnosis not present

## 2022-09-04 MED ORDER — PRAZOSIN HCL 1 MG PO CAPS
1.0000 mg | ORAL_CAPSULE | Freq: Every day | ORAL | Status: DC
  Administered 2022-09-04 – 2022-09-08 (×5): 1 mg via ORAL
  Filled 2022-09-04 (×7): qty 1

## 2022-09-04 MED ORDER — DIVALPROEX SODIUM ER 500 MG PO TB24
1000.0000 mg | ORAL_TABLET | Freq: Every day | ORAL | Status: DC
  Administered 2022-09-05 – 2022-09-08 (×4): 1000 mg via ORAL
  Filled 2022-09-04 (×6): qty 2

## 2022-09-04 MED ORDER — PNEUMOCOCCAL 20-VAL CONJ VACC 0.5 ML IM SUSY
0.5000 mL | PREFILLED_SYRINGE | INTRAMUSCULAR | Status: AC
  Administered 2022-09-05: 0.5 mL via INTRAMUSCULAR
  Filled 2022-09-04: qty 0.5

## 2022-09-04 MED ORDER — NICOTINE POLACRILEX 2 MG MT GUM
2.0000 mg | CHEWING_GUM | OROMUCOSAL | Status: DC | PRN
  Administered 2022-09-04 – 2022-09-07 (×2): 2 mg via ORAL
  Filled 2022-09-04 (×4): qty 1

## 2022-09-04 MED ORDER — QUETIAPINE FUMARATE 100 MG PO TABS
100.0000 mg | ORAL_TABLET | Freq: Every day | ORAL | Status: DC
  Administered 2022-09-04 – 2022-09-06 (×3): 100 mg via ORAL
  Filled 2022-09-04 (×4): qty 1

## 2022-09-04 MED ORDER — TRAMADOL HCL 50 MG PO TABS: 100.0000 mg | ORAL_TABLET | Freq: Three times a day (TID) | ORAL | Status: DC

## 2022-09-04 NOTE — Group Note (Signed)
Recreation Therapy Group Note   Group Topic:Communication  Group Date: 09/04/2022 Start Time: 0955 End Time: 1037 Facilitators: Antuane Eastridge-McCall, LRT,CTRS Location: [address] Dayroom   Goal Area(s) Addresses:  Patient will effectively listen to complete activity.  Patient will identify communication skills used to make activity successful.  Patient will identify how skills used during activity can be used to reach post d/c goals.    Group Description: Geometric Drawings.  Three volunteers from the peer group will be shown an abstract picture with a particular arrangement of geometrical shapes.  Each round, one 'speaker' will describe the pattern, as accurately as possible without revealing the image to the group.  The remaining group members will listen and draw the picture to reflect how it is described to them. Patients with the role of 'listener' cannot ask clarifying questions but, may request that the speaker repeat a direction. Once the drawings are complete, the presenter will show the rest of the group the picture and compare how close each person came to drawing the picture. LRT will facilitate a post-activity discussion regarding effective communication and the importance of planning, listening, and asking for clarification in daily interactions with others.   Affect/Mood: N/A   Participation Level: Did not attend    Clinical Observations/Individualized Feedback:     Plan: Continue to engage patient in RT group sessions 2-3x/week.   Kanesha Cadle-McCall, LRT,CTRS  09/04/2022 10:59 AM

## 2022-09-04 NOTE — H&P (Signed)
Psychiatric Admission Assessment Adult  Patient Identification: Kenneth Jensen MRN:  [id number] Date of Evaluation:  09/04/2022 Chief Complaint:  Schizoaffective disorder, bipolar type (HCC) [F25.0] Principal Diagnosis: Schizophrenia, paranoid (HCC) Diagnosis:  Principal Problem:   Schizophrenia, paranoid (HCC) Active Problems:   Tobacco use disorder   Cannabis use disorder   Cocaine use disorder, severe, dependence (HCC)    CC: "I just want to sleep this off"  Kenneth Jensen is a 32 y.o. male  with a past psychiatric history of schizoaffective disorder, bipolar type polysubstance use disorder (tobacco, canabis, cocaine), multiple ED visits, and psychiatric hospitalizations (last 07/09/2021). He is established with [facility] ACT team. Patient initially arrived to [facility] voluntarily on 09/03/2022 for worsening psychosis, and admitted to [facility] under IVC on 09/03/2022 for substance related issues and stabilization of acute on chronic psychiatric conditions. PMHx is significant for prediabetes. UDS positive for cocaine and THC.   Collateral Information Patient provided verbal consent to contact his ACT team.  [facility] team, at [phone] ext [phone] (also provided Kenneth Jensen, team lead's contact, [phone]): I spoke with Kenneth Jensen who reports the patient has been receiving services for the past 3 years.  He was last seen on 08/27/2022, patient had [place]".  Patient has a history of substance use, and there was concern that he was actively using drugs during this time.  They confirm that patient when sober and compliant on his medication does very well.  Recently he has not been compliant, misses most of his doses.  At times will use 1-2 tablets in a week, pill packs are checked on a weekly basis.  Kenneth Jensen is also reports that patient has a poor relationship with his family, he has several family members in the area that do not check up on him.  ACT team  was able to confirm that patient has an open case for guardianship by DSS.  They confirm that his last [place] 234 injection was on 08/13/2022.  They will try to visit the patient while he is hospitalized.  HPI:  Patient was assessed at bedside, he reports excessive somnolence and sedation from nighttime meds.  He notes that his sleep has been poor, has been having frequent nightmares.  Patient believes this is tied to past trauma, which he declines to elaborate on.  He reports appetite is adequate and unchanged. He is willing to participate in the assessment.  Although he is a poor historian, reports he initially went to the ED for worsening paranoia.  Describes his paranoia as feeling suspicious of other people, becomes easily irritable and agitated.  He admits to being noncompliant on his current psychotropic medications, also admits to recent drug use.  He has been using cannabis since the age of 57, smoking approximately 1 blunt daily.  Has also been using cocaine since the age of 30, reports snorting an unspecified amount on a near daily basis.  He recognizes that use of cocaine exacerbates his paranoia. Patient does not know any specific withdrawals or cravings for cannabis or cocaine.  He does request to have Nicorette gum.  He declines use of patch, reports experiencing nightmares while using this.  On assessment, patient continues to report paranoid ideations, although improved from initial visit to ED.  He shares that he is suspicious of this interviewer, but overall feels safe on the unit.  He denies any history of suicidal ideation, current or past.  He denies any homicidal ideations.  Denies any  auditory or visual hallucinations.  Patient denies thought insertion, thought withdrawal, thought broadcasting, and ideas of reference.  Psychiatric ROS Mood Symptoms Denies any symptoms of depressed mood or anhedonia.  Denies any suicidal ideations.  Manic Symptoms Denies any history of  expansive energy or mood lasting more than 24 hours.  Patient is also unsure if this has ever occurred outside of drug use.  Anxiety Symptoms Denies  Trauma Symptoms Reports a history of trauma.  Does not specify of sexual or physical.  Reports frequent nightmares related to past traumatic events.  Agreeable to starting nightly prazosin  Psychosis Symptoms Reports paranoia, mild suspicion of medical staff, otherwise improved from when he initially arrived to the ED yesterday.  Denies hallucinations.  Denies delusional thought processes such as thought insertion, thought withdrawal, thought broadcasting and ideas of reference.   Past Psychiatric Hx: Patient receives psychiatric services through [facility] ACT team in [place]. Previous Psychiatric Diagnoses:  Per ACTT - Schizophrenia, paranoid ; no hx of bipolar d/o; cannabis use disorder, moderate; cocaine use disorder, mild Current psychiatric medications: "Per [facility] ACT team lead, Kenneth Jensen, patient is prescribed Depakote ER 500 mg TID as well as Gean Birchwood IM monthly and Seroquel 200 mg at bedtime. Team lead reports that patient is noncompliant with medications at times. " LAI: Invegga Sustenna 234 mg monthly, last injection 08/13/2022  Depakote ER 1,000 mg at bedtime quetiapine 200 mg daily  Psychiatric medication history/compliance: ACTT confirms is historically noncompliant. Psychiatric Hospitalization hx: Hospitalized at [facility] on 7//2023-07/16/2021 Hospitalized at [facility] on 05/07/2018 23-5//2023 Hospitalized at [facility] on 04/22/2021-04/25/2021 Psychotherapy hx: None identified on chart review Neuromodulation history: None identified on chart review History of suicide (obtained from HPI): In previous psychiatric hospitalizations, has made subjective reports of SI.  No history of times identified on chart review History of homicide or aggression (obtained in HPI): Has had episodes of aggression towards family, documented back in 2021  Substance  Abuse Hx: Alcohol: Denies Tobacco: 0.5 PPD since age 11 Cannabis: Smokes 1 blunt daily, since age of 67 Other Illicit drugs: Reports daily cocaine use, via snorting, amount not specified.  Denies IV drug use Rx drug abuse: Denies Rehab hx: Patient denies  Past Medical History: PCP: Denies Medical Dx: Pt denies any chronic medical conditions. B12 deficiency and Pre-diabetes onc hart review Medications: Denies Allergies: Zyprexa-reports throat closing up on this medication, reaction Haldol on chart review as well Hospitalizations: Denies Surgeries:Denies Trauma: No TBI or concussions identified on chart review Seizures: ACTT is not aware of this. 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 Has family int he area, they don't check on him.  Legal Guardian: DSS open case for guardianship. No LG currently. Education: completed 10th grade Occupational hx: unemployed, receive SSI Marital Status: Single Children: None Legal: Patient denies, ACTT not aware of any legal hx. Has told team he has gone to jail in past. Military: No   Access to firearms: Patient, ACTT not aware of any in the home.   Total Time spent with patient: 1.5 hours  Is the patient at risk to self? Yes.    Has the patient been a risk to self in the past 6 months? Yes.    Has the patient been  a risk to self within the distant past? Yes.    Is the patient a risk to others? No.  Has the patient been a risk to others in the past 6 months? No.  Has the patient been a risk to others within the distant past? No.   [place] Scale:  Flowsheet Row [facility] ([facility] Most recent reading at 09/03/2022  8:15 PM ED from 09/03/2022 in [facility] Emergency Department at [facility] Most recent reading at 09/03/2022  1:04 AM ED from 07/02/2022 in [facility] Emergency Department at [facility] Most recent reading at 07/02/2022  5:21 PM  C-SSRS RISK CATEGORY No Risk No Risk No Risk        Tobacco Screening:  Social History   Tobacco Use  Smoking Status Every Day   Current packs/day: 0.50   Types: Cigarettes  Smokeless Tobacco Never    BH Tobacco Counseling     Are you interested in Tobacco Cessation Medications?  No, patient refused Counseled patient on smoking cessation:  Refused/Declined practical counseling Reason Tobacco Screening Not Completed: Patient Refused Screening       Social History:  Social History   Substance and Sexual Activity  Alcohol Use Yes   Comment: social     Social History   Substance and Sexual Activity  Drug Use Yes   Types: Marijuana, Cocaine   Comment: last marijuana use 09/02/22    Additional Social History:                           Allergies:   Allergies  Allergen Reactions   Penicillins Anaphylaxis   Amoxicillin Swelling   Haldol [Haloperidol]     Pt states "I went crazy"   Percocet [Oxycodone-Acetaminophen]    Vicodin [Hydrocodone-Acetaminophen] Itching   Vicodin [Hydrocodone-Acetaminophen]    Lab Results:  Results for orders placed or performed during the hospital encounter of 09/03/22 (from the past 48 hour(s))  Comprehensive metabolic panel     Status: Abnormal   Collection Time: 09/03/22  1:08 AM  Result Value Ref Range   Sodium 136 135 - 145 mmol/L   Potassium 3.5 3.5 - 5.1 mmol/L   Chloride 100 98 - 111 mmol/L   CO2 24 22 - 32 mmol/L   Glucose, Bld 106 (H) 70 - 99 mg/dL    Comment: Glucose reference range applies only to samples taken after fasting for at least 8 hours.   BUN 11 6 - 20 mg/dL   Creatinine, Ser 1.61 0.61 - 1.24  mg/dL   Calcium 8.8 (L) 8.9 - 10.3 mg/dL   Total Protein 7.9 6.5 - 8.1 g/dL   Albumin 4.5 3.5 - 5.0 g/dL   AST 20 15 - 41 U/L   ALT 15 0 - 44 U/L   Alkaline Phosphatase 62 38 - 126 U/L   Total Bilirubin 0.5 0.3 - 1.2 mg/dL   GFR, Estimated >09 >60 mL/min    Comment: (NOTE) Calculated using the CKD-EPI Creatinine Equation (2021)    Anion gap 12 5 - 15    Comment: Performed at [facility], [address] [place], [place] [address]  Ethanol     Status: None   Collection Time: 09/03/22  1:08 AM  Result Value Ref Range   Alcohol, Ethyl (B) <10 <10 mg/dL    Comment: (NOTE) Lowest detectable limit for serum alcohol is 10 mg/dL.  For medical purposes only.  Performed at [facility], [address] [place], [place] [address]   Salicylate level     Status: Abnormal   Collection Time: 09/03/22  1:08 AM  Result Value Ref Range   Salicylate Lvl <7.0 (L) 7.0 - 30.0 mg/dL    Comment: Performed at [facility], [address] [place], [place] [address]  Acetaminophen level     Status: Abnormal   Collection Time: 09/03/22  1:08 AM  Result Value Ref Range   Acetaminophen (Tylenol), Serum <10 (L) 10 - 30 ug/mL    Comment: (NOTE) Therapeutic concentrations vary significantly. A range of 10-30 ug/mL  may be an effective concentration for many patients. However, some  are best treated at concentrations outside of this range. Acetaminophen concentrations >150 ug/mL at 4 hours after ingestion  and >50 ug/mL at 12 hours after ingestion are often associated with  toxic reactions.  Performed at [facility], [address] [place], [place] [address]   cbc     Status: Abnormal   Collection Time: 09/03/22  1:08 AM  Result Value Ref Range   WBC 12.0 (H) 4.0 - 10.5 K/uL   RBC 4.86 4.22 - 5.81 MIL/uL   Hemoglobin 14.0 13.0 - 17.0 g/dL   HCT 13.0 86.5 - 78.4 %   MCV 87.9 80.0 - 100.0 fL   MCH 28.8 26.0 - 34.0 pg   MCHC 32.8 30.0 - 36.0 g/dL   RDW  69.6 29.5 - 28.4 %   Platelets 341 150 - 400 K/uL   nRBC 0.0 0.0 - 0.2 %    Comment: Performed at [facility], [address] [place], [place] [address]  Valproic acid level     Status: None   Collection Time: 09/03/22  1:08 AM  Result Value Ref Range   Valproic Acid Lvl 53 50.0 - 100.0 ug/mL    Comment: Performed at [facility], [address] [place], [place] [address]  Urine Drug Screen, Qualitative     Status: Abnormal   Collection Time: 09/03/22  1:12 AM  Result Value Ref Range   Tricyclic, Ur Screen NONE DETECTED NONE DETECTED   Amphetamines, Ur Screen NONE DETECTED NONE DETECTED   MDMA (Ecstasy)Ur Screen NONE DETECTED NONE DETECTED   Cocaine Metabolite,Ur [place] POSITIVE (A) NONE DETECTED   Opiate, Ur Screen NONE DETECTED NONE DETECTED   Phencyclidine (PCP) Ur S NONE DETECTED NONE DETECTED   Cannabinoid 50 Ng, Ur  POSITIVE (A) NONE DETECTED   Barbiturates, Ur Screen NONE DETECTED NONE DETECTED   Benzodiazepine, Ur Scrn NONE DETECTED NONE DETECTED   Methadone Scn, Ur NONE DETECTED NONE DETECTED    Comment: (NOTE) Tricyclics + metabolites, urine    Cutoff 1000 ng/mL Amphetamines + metabolites, urine  Cutoff 1000 ng/mL MDMA (Ecstasy), urine              Cutoff 500 ng/mL Cocaine Metabolite, urine          Cutoff 300 ng/mL Opiate + metabolites, urine        Cutoff 300 ng/mL Phencyclidine (PCP), urine         Cutoff 25 ng/mL Cannabinoid, urine                 Cutoff 50 ng/mL Barbiturates + metabolites, urine  Cutoff 200 ng/mL Benzodiazepine, urine              Cutoff 200 ng/mL Methadone, urine  Cutoff 300 ng/mL  The urine drug screen provides only a preliminary, unconfirmed analytical test result and should not be used for non-medical purposes. Clinical consideration and professional judgment should be applied to any positive drug screen result due to possible interfering substances. A more specific alternate chemical method must be used  in order to obtain a confirmed analytical result. Gas chromatography / mass spectrometry (GC/MS) is the preferred confirm atory method. Performed at [facility], [address] [place], [place] [address]     Blood Alcohol level:  Lab Results  Component Value Date   [facility] <10 09/03/2022   ETH <10 07/02/2022    Metabolic Disorder Labs:  Lab Results  Component Value Date   HGBA1C 5.7 (H) 06/05/2022   MPG 116.89 04/23/2021   MPG 116.89 05/15/2020   No results found for: "PROLACTIN" Lab Results  Component Value Date   CHOL 177 06/05/2022   TRIG 163 (H) 06/05/2022   HDL 41 06/05/2022   CHOLHDL 4.3 06/05/2022   VLDL 25 04/23/2021   LDLCALC 107 (H) 06/05/2022   LDLCALC 77 04/23/2021    Current Medications: Current Facility-Administered Medications  Medication Dose Route Frequency Provider Last Rate Last Admin   alum & mag hydroxide-simeth (MAALOX/MYLANTA) 200-200-20 MG/5ML suspension 30 mL  30 mL Oral Q4H PRN Bennett, Christal H, NP       diphenhydrAMINE (BENADRYL) capsule 50 mg  50 mg Oral BID PRN Bennett, Christal H, NP       Or   diphenhydrAMINE (BENADRYL) injection 50 mg  50 mg Intramuscular BID PRN Bennett, Christal H, NP       [START ON 09/05/2022] divalproex (DEPAKOTE ER) 24 hr tablet 1,000 mg  1,000 mg Oral QHS Carrion-Carrero, Verita Kuroda, MD       hydrOXYzine (ATARAX) tablet 50 mg  50 mg Oral TID PRN Willeen Cass, Christal H, NP   50 mg at 09/03/22 2047   LORazepam (ATIVAN) tablet 2 mg  2 mg Oral TID PRN Bennett, Christal H, NP       Or   LORazepam (ATIVAN) injection 2 mg  2 mg Intramuscular BID PRN Bennett, Christal H, NP       magnesium hydroxide (MILK OF MAGNESIA) suspension 30 mL  30 mL Oral Daily PRN Bennett, Christal H, NP       nicotine polacrilex (NICORETTE) gum 2 mg  2 mg Oral PRN Pashayan, Mardelle Matte, MD       OLANZapine zydis (ZYPREXA) disintegrating tablet 5 mg  5 mg Oral BID PRN Bennett, Christal H, NP       Or   OLANZapine (ZYPREXA) injection 5 mg  5  mg Intramuscular BID PRN Bennett, Christal H, NP       [START ON 09/05/2022] pneumococcal 20-valent conjugate vaccine (PREVNAR 20) injection 0.5 mL  0.5 mL Intramuscular Tomorrow-1000 Massengill, Nathan, MD       prazosin (MINIPRESS) capsule 1 mg  1 mg Oral QHS Carrion-Carrero, Erwin Nishiyama, MD       QUEtiapine (SEROQUEL) tablet 100 mg  100 mg Oral QHS Carrion-Carrero, Arianne Klinge, MD       traZODone (DESYREL) tablet 50 mg  50 mg Oral QHS PRN Willeen Cass, Christal H, NP   50 mg at 09/03/22 2047   PTA Medications: Medications Prior to Admission  Medication Sig Dispense Refill Last Dose   divalproex (DEPAKOTE ER) 500 MG 24 hr tablet Take 1 tablet (500 mg total) by mouth 3 (three) times daily. 90 tablet 1    paliperidone (INVEGA SUSTENNA) 234 MG/1.5ML  injection Inject 234 mg into the muscle every 28 (twenty-eight) days. 1.8 mL 1    QUEtiapine (SEROQUEL) 200 MG tablet Take 200 mg by mouth at bedtime.       Musculoskeletal: Strength & Muscle Tone: within normal limits Gait & Station: normal Patient leans: N/A  Psychiatric Specialty Exam:  Presentation  General Appearance:  Disheveled  Eye Contact: None (eyes closed)  Speech: Clear and Coherent; Normal Rate  Speech Volume: Decreased  Handedness: -- (not formally assessed)   Mood and Affect  Mood: -- ("Fine")  Affect: Flat   Thought Process  Thought Processes: Linear; Coherent  Descriptions of Associations: Intact  Orientation: -- (not formally assessed)  Thought Content: Logical; Paranoid Ideation  History of Schizophrenia/Schizoaffective disorder: Yes  Duration of Psychotic Symptoms: > 6 months Hallucinations: Hallucinations: None Description of Auditory Hallucinations: He does not share  Ideas of Reference: None  Suicidal Thoughts: Suicidal Thoughts: No  Homicidal Thoughts: Homicidal Thoughts: No   Sensorium  Memory: Immediate Fair; Recent Fair; Remote  Fair  Judgment: Poor  Insight: Poor   Executive Functions  Concentration: Poor  Attention Span: Poor  Recall: Poor  Fund of Knowledge: Fair  Language: Fair   Psychomotor Activity  Psychomotor Activity: Psychomotor Activity: Normal   Assets  Assets: Communication Skills; Resilience   Sleep  Sleep: Sleep: Fair    Physical Exam: Physical Exam Vitals and nursing note reviewed.  Constitutional:      Appearance: Normal appearance.  HENT:     Head: Normocephalic and atraumatic.  Pulmonary:     Effort: Pulmonary effort is normal. No respiratory distress.  Skin:    General: Skin is warm and dry.  Neurological:     General: No focal deficit present.     Mental Status: He is easily aroused. He is lethargic.    Review of Systems  Constitutional:  Negative for chills and fever.  Respiratory:  Negative for shortness of breath.   Cardiovascular:  Negative for chest pain.  Gastrointestinal:  Negative for abdominal pain, constipation and diarrhea.  Psychiatric/Behavioral:  Positive for substance abuse. Negative for depression, hallucinations, memory loss and suicidal ideas. The patient has insomnia. The patient is not nervous/anxious.    Blood pressure 121/78, pulse 99, temperature 98.2 F (36.8 C), temperature source Oral, resp. rate 16, height 5\' 6"  (1.676 m), weight 96.2 kg, SpO2 100%. Body mass index is 34.22 kg/m.   Treatment Plan Summary: Daily contact with patient to assess and evaluate symptoms and progress in treatment and Medication management   ASSESSMENT: Kenneth Jensen is a 32 y.o. male  with a past psychiatric history of schizoaffective disorder, bipolar type polysubstance use disorder (tobacco, canabis, cocaine), multiple ED visits, and psychiatric hospitalizations (last 07/09/2021). He is established with [facility] ACT team. Patient initially arrived to [facility]ED voluntarily on 09/03/2022 for worsening psychosis, and admitted to [facility] under IVC on  09/03/2022 for substance related issues and stabilization of acute on chronic psychiatric conditions. PMHx is significant for prediabetes. UDS positive for cocaine and THC.   We will uphold IVC.  Diagnoses / Active Problems: Schizophrenia, paranoid R/o PTSD Stimulant use disorder, severe Cannabis use disorder Tobacco use disorder   PLAN: Safety and Monitoring:  -- INVOLUNTARY  admission to inpatient psychiatric unit for safety, stabilization and treatment  -- Daily contact with patient to assess and evaluate symptoms and progress in treatment  -- Patient's case to be discussed in multi-disciplinary team meeting  -- Observation Level : q15 minute checks  -- Vital signs:  q12 hours  -- Precautions: suicide, elopement, and assault  2. Psychiatric Diagnoses and Treatment:  Restart home Depakote DR 1000 mg nightly for mood stabilization Decrease Seroquel to 100 mg nightly to 100 mg nightly, for psychosis with added benefit of sleep Start prazosin 1 mg nightly for nightmares -- The risks/benefits/side-effects/alternatives to this medication were discussed in detail with the patient and time was given for questions. The patient consents to medication trial.              -- Metabolic profile and EKG monitoring obtained while on an atypical antipsychotic  BMI: 34.22 kg/m^2 TSH: pending Lipid Panel: Tgs 163, LDL 107 on 06/06/2022 HbgA1c: 5.7% on 06/06/2022 QTc: 457 on 09/03/2022             -- Encouraged patient to participate in unit milieu and in scheduled group therapies   -- Short Term Goals: Ability to identify changes in lifestyle to reduce recurrence of condition will improve and Ability to verbalize feelings will improve  -- Long Term Goals: Improvement in symptoms so as ready for discharge Other PRNS: Maalox/Mylanta, Atarax, milk of magnesia, Nicorette, trazodone, agitation (Benadryl, Ativan, Zyprexa)   3. Medical Issues Being Addressed:  #Tobacco Use Disorder  Nicorette  gum Smoking cessation encouraged   4. Discharge Planning:   -- Social work and case management to assist with discharge planning and identification of hospital follow-up needs prior to discharge  -- Estimated LOS: 5-7 days  -- Discharge Concerns: Need to establish a safety plan; Medication compliance and effectiveness  -- Discharge Goals: Return home with outpatient referrals for mental health follow-up including medication management/psychotherapy   I certify that inpatient services furnished can reasonably be expected to improve the patient's condition.   This note was created using a voice recognition software as a result there may be grammatical errors inadvertently enclosed that do not reflect the nature of this encounter. Every attempt is made to correct such errors.   Signed: Dr. Liston Alba, MD PGY-2, Psychiatry Residency  8/29/20244:13 PM

## 2022-09-04 NOTE — Progress Notes (Signed)
[facility] LCSW Progress Note   09/04/2022    1:48 PM  Kenneth Jensen      Type of Note: Attempt on Assessment    CSW went back to see patient from this morning to complete his assessment and as soon as CSW asked a question patient stated, " I am hearing voices right now, can you come back tomorrow ". CSW will attempt patient assessment tomorrow.     Signed:   Jacob Moores, MSW, LCSWA 09/04/2022 1:48 PM

## 2022-09-04 NOTE — Telephone Encounter (Signed)
Crystal from [facility] left VM that they need additional diagnosis codes for medical necessity for some labs.  Callback # [id number] Reference # [id number]

## 2022-09-04 NOTE — Plan of Care (Signed)
  Problem: Education: Goal: Emotional status will improve Outcome: Progressing Goal: Mental status will improve Outcome: Progressing   
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
## 2022-09-04 NOTE — Progress Notes (Signed)
   09/04/22 0600  15 Minute Checks  Location Bedroom  Visual Appearance Calm  Behavior Sleeping  Sleep (Behavioral Health Patients Only)  Calculate sleep? (Click Yes once per 24 hr at 0600 safety check) Yes  Documented sleep last 24 hours 8.25

## 2022-09-04 NOTE — Group Note (Signed)
Occupational Therapy Group Note  Group Topic:Coping Skills  Group Date: 09/04/2022 Start Time: 1430 End Time: 1500 Facilitators: Ted Mcalpine, OT   Group Description: Group encouraged increased engagement and participation through discussion and activity focused on "Coping Ahead." Patients were split up into teams and selected a card from a stack of positive coping strategies. Patients were instructed to act out/charade the coping skill for other peers to guess and receive points for their team. Discussion followed with a focus on identifying additional positive coping strategies and patients shared how they were going to cope ahead over the weekend while continuing hospitalization stay.  Therapeutic Goal(s): Identify positive vs negative coping strategies. Identify coping skills to be used during hospitalization vs coping skills outside of hospital/at home Increase participation in therapeutic group environment and promote engagement in treatment   Participation Level: Did not attend                              Plan: Continue to engage patient in OT groups 2 - 3x/week.  09/04/2022  Ted Mcalpine, OT  Kerrin Champagne, OT

## 2022-09-04 NOTE — Progress Notes (Signed)
Adult Psychoeducational Group Note  Date:  09/04/2022 Time:  8:45 PM  Group Topic/Focus:  Wrap-Up Group:   The focus of this group is to help patients review their daily goal of treatment and discuss progress on daily workbooks.  Participation Level:  Active  Participation Quality:  Appropriate  Affect:  Appropriate  Cognitive:  Appropriate  Insight: Appropriate  Engagement in Group:  Engaged  Modes of Intervention:  Discussion and Support  Additional Comments:  Pt attended group and participated. Pt states wanting to go home. Pt states day was a 3/10 after stating it was boring. Pt states enjoying some of the food.   Dontell Mian Katrinka Blazing 09/04/2022, 8:45 PM

## 2022-09-04 NOTE — Progress Notes (Signed)
   09/04/22 2000  Psych Admission Type (Psych Patients Only)  Admission Status Involuntary  Psychosocial Assessment  Patient Complaints Anxiety;Sleep disturbance  Eye Contact Brief  Facial Expression Flat  Affect Blunted  Speech Logical/coherent;Other (Comment) (delayed responses)  Interaction Assertive  Motor Activity Slow  Appearance/Hygiene In scrubs;Disheveled  Behavior Characteristics Cooperative;Anxious;Guarded  Mood Preoccupied  Thought Process  Coherency Circumstantial  Content Preoccupation  Delusions None reported or observed  Perception WDL  Hallucination None reported or observed  Judgment Poor  Confusion None  Danger to Self  Current suicidal ideation? Denies  Agreement Not to Harm Self Yes  Description of Agreement verbal  Danger to Others  Danger to Others None reported or observed   Progress note   D: Pt seen in his room. Pt denies SI, HI, AVH. Pt rates pain  0/10. Pt rates anxiety  5/10 and depression  0/10. Pt states that he was upset earlier in the day because his back was hurting. When asked about pain now, pt denies it. Delayed responses to questions. Pt cooperative. Reported from day shift that pt had concerns about being given medications he didn't understand. Assured pt that he will be given the names and dosages of all medications before administration and given the opportunity to ask questions. Pt is okay with this. Pt reported he slept poorly last night. Spoke with pt about removing nicotine patch before bed. Pt states he isn't wearing one today and has switched to nicotine gum. No other concerns noted at this time.  A: Pt provided support and encouragement. Pt given scheduled medication as prescribed. PRNs as appropriate. Q15 min checks for safety.   R: Pt safe on the unit. Will continue to monitor.

## 2022-09-04 NOTE — Plan of Care (Signed)
Problem: Safety: Goal: Periods of time without injury will increase Outcome: Progressing   Pt noted in bed majority of this shift. Observed with labile mood, agitated, irritable on interactions when approach for assessment "I didn't sleep last night. I want to sleep. Y'all not going to give me all these medicines after what I took over there. I took medicines I know and didn't know yesterday at the other hospital. I'm not doing all that here. I don't even take medicines like that". Refused to comply with assessment "No, I'm not going to hurts myself or others. I just want to sleep now". Took his scheduled Depakote for AM and noon with multiple verbal redirections / prompts from staff. More cooperative with males.  Pt did not attend scheduled groups despite multiple verbal prompts / education the importance of compliance "I'm not doing that. They did not tell me I have to do that. I'm not here for no AA meeting or anything" even after being educated on the types / purpose of groups. Tolerated meals, fluids and medications well. Safety checks maintained at Q 15 minutes intervals. Emotional support and reassurance offered. Encouraged by team to voice concerns. Asleep in bed at this time. Pt continue to need verbal redirections to comply with care, unit routine.

## 2022-09-05 ENCOUNTER — Encounter (HOSPITAL_COMMUNITY): Payer: Self-pay

## 2022-09-05 DIAGNOSIS — F200 Paranoid schizophrenia: Secondary | ICD-10-CM | POA: Diagnosis not present

## 2022-09-05 DIAGNOSIS — Z720 Tobacco use: Secondary | ICD-10-CM | POA: Diagnosis not present

## 2022-09-05 DIAGNOSIS — F1420 Cocaine dependence, uncomplicated: Secondary | ICD-10-CM

## 2022-09-05 DIAGNOSIS — F1290 Cannabis use, unspecified, uncomplicated: Secondary | ICD-10-CM | POA: Diagnosis not present

## 2022-09-05 MED ORDER — LIDOCAINE 5 % EX PTCH
1.0000 | MEDICATED_PATCH | Freq: Every day | CUTANEOUS | Status: DC | PRN
  Administered 2022-09-05 – 2022-09-08 (×2): 1 via TRANSDERMAL
  Filled 2022-09-05 (×2): qty 1

## 2022-09-05 NOTE — Progress Notes (Signed)
   09/05/22 1945  Psych Admission Type (Psych Patients Only)  Admission Status Involuntary  Psychosocial Assessment  Patient Complaints Anxiety  Eye Contact Fair  Facial Expression Flat  Affect Appropriate to circumstance  Speech Logical/coherent  Interaction Intrusive  Motor Activity Slow  Appearance/Hygiene In scrubs  Behavior Characteristics Cooperative;Anxious  Mood Preoccupied;Anxious  Thought Process  Coherency Circumstantial  Content Preoccupation  Delusions None reported or observed  Perception WDL  Hallucination None reported or observed  Judgment Poor  Confusion None  Danger to Self  Current suicidal ideation? Denies  Agreement Not to Harm Self Yes  Description of Agreement verbal  Danger to Others  Danger to Others None reported or observed   Progress note   D: Pt seen at nurse's station. Pt denies SI, HI, AVH. Pt rates pain  0/10 but says he has discomfort in his lower back. Pt is allergic to some pain medications so a pain patch has been ordered. Pt rates anxiety  5/10 and depression  0/10. Pt says he is anxious about leaving. Wants to be discharged. Notes that he has been taking all of his medications. Asks about the gray armband he is wearing. Explained IVC process and that he can request that IVC be rescinded before discharge. Pt more calm now that his questions have been answered. No other concerns noted at this time.  A: Pt provided support and encouragement. Pt given scheduled medication as prescribed. PRNs as appropriate. Q15 min checks for safety.   R: Pt safe on the unit. Will continue to monitor.

## 2022-09-05 NOTE — Plan of Care (Signed)
Problem: Activity: Goal: Sleeping patterns will improve Outcome: Progressing   Problem: Health Behavior/Discharge Planning: Goal: Compliance with treatment plan for underlying cause of condition will improve Outcome: Progressing  Pt A & O X3 this shift. Irritable on initial interactions when approach for assessment and medications. Per pt "I already told you. I don't want to be bother by you. I'm not like everyone, my body got issues. I'm not going to take your medications everyday". However, pt noted with improved mood this afternoon. Observed with bright affect, verbally engaged with others and attended scheduled groups. Off unit for meals, courtyard, returned without issues. but   Tolerated meals, medications and fluids well. Safety checks maintained at Q 15 minutes intervals without incident. Emotional support, encouragement and reassurance offered. Pt denies concerns at this time.

## 2022-09-05 NOTE — Progress Notes (Signed)
Adult Psychoeducational Group Note  Date:  09/05/2022 Time:  8:22 PM  Group Topic/Focus:  Wrap-Up Group:   The focus of this group is to help patients review their daily goal of treatment and discuss progress on daily workbooks.  Participation Level:  Minimal  Participation Quality:  Attentive and Resistant  Affect:  Angry and Blunted  Cognitive:  Alert  Insight: Appropriate  Engagement in Group:  Defensive and Engaged  Modes of Intervention:  Discussion and Support  Additional Comments:  Pt attended group but, was annoyed that he was still here. Pt states he wants to be discharged. Pt states not finding out about when he is leaving. Pt states he's just surviving. Pt did rate his day a 10/10 but when asked why, pt did not elaborate.  Sui Kasparek Katrinka Blazing 09/05/2022, 8:22 PM

## 2022-09-05 NOTE — BH IP Treatment Plan (Signed)
Interdisciplinary Treatment and Diagnostic Plan Update  09/05/2022 Time of Session: 11:10 AM  Kenneth Jensen MRN: [id number]  Principal Diagnosis: Schizophrenia, paranoid (HCC)  Secondary Diagnoses: Principal Problem:   Schizophrenia, paranoid (HCC) Active Problems:   Tobacco use disorder   Cannabis use disorder   Cocaine use disorder, severe, dependence (HCC)   Current Medications:  Current Facility-Administered Medications  Medication Dose Route Frequency Provider Last Rate Last Admin   alum & mag hydroxide-simeth (MAALOX/MYLANTA) 200-200-20 MG/5ML suspension 30 mL  30 mL Oral Q4H PRN Bennett, Christal H, NP       diphenhydrAMINE (BENADRYL) capsule 50 mg  50 mg Oral BID PRN Bennett, Christal H, NP       Or   diphenhydrAMINE (BENADRYL) injection 50 mg  50 mg Intramuscular BID PRN Bennett, Christal H, NP       divalproex (DEPAKOTE ER) 24 hr tablet 1,000 mg  1,000 mg Oral QHS Carrion-Carrero, Margely, MD       hydrOXYzine (ATARAX) tablet 50 mg  50 mg Oral TID PRN Willeen Cass, Christal H, NP   50 mg at 09/04/22 2043   LORazepam (ATIVAN) tablet 2 mg  2 mg Oral TID PRN Bennett, Christal H, NP       Or   LORazepam (ATIVAN) injection 2 mg  2 mg Intramuscular BID PRN Bennett, Christal H, NP       magnesium hydroxide (MILK OF MAGNESIA) suspension 30 mL  30 mL Oral Daily PRN Bennett, Christal H, NP       nicotine polacrilex (NICORETTE) gum 2 mg  2 mg Oral PRN Lauro Franklin, MD   2 mg at 09/04/22 2043   pneumococcal 20-valent conjugate vaccine (PREVNAR 20) injection 0.5 mL  0.5 mL Intramuscular Tomorrow-1000 Massengill, Nathan, MD       prazosin (MINIPRESS) capsule 1 mg  1 mg Oral QHS Carrion-Carrero, Margely, MD   1 mg at 09/04/22 2043   QUEtiapine (SEROQUEL) tablet 100 mg  100 mg Oral QHS Carrion-Carrero, Margely, MD   100 mg at 09/04/22 2043   traZODone (DESYREL) tablet 50 mg  50 mg Oral QHS PRN Willeen Cass, Christal H, NP   50 mg at 09/04/22 2043   PTA Medications: Medications  Prior to Admission  Medication Sig Dispense Refill Last Dose   divalproex (DEPAKOTE ER) 500 MG 24 hr tablet Take 1 tablet (500 mg total) by mouth 3 (three) times daily. 90 tablet 1    paliperidone (INVEGA SUSTENNA) 234 MG/1.5ML injection Inject 234 mg into the muscle every 28 (twenty-eight) days. 1.8 mL 1    QUEtiapine (SEROQUEL) 200 MG tablet Take 200 mg by mouth at bedtime.       Patient Stressors: Financial difficulties   Medication change or noncompliance   Other: mom lives in [place] and he wants to move there     Patient Strengths: Ability for insight  Average or above average intelligence  Capable of independent living  Motivation for treatment/growth  Supportive family/friends   Treatment Modalities: Medication Management, Group therapy, Case management,  1 to 1 session with clinician, Psychoeducation, Recreational therapy.   Physician Treatment Plan for Primary Diagnosis: Schizophrenia, paranoid (HCC) Long Term Goal(s): Improvement in symptoms so as ready for discharge   Short Term Goals: Ability to identify changes in lifestyle to reduce recurrence of condition will improve Ability to verbalize feelings will improve  Medication Management: Evaluate patient's response, side effects, and tolerance of medication regimen.  Therapeutic Interventions: 1 to 1 sessions, Unit Group sessions and Medication administration.  Evaluation of Outcomes: Progressing  Physician Treatment Plan for Secondary Diagnosis: Principal Problem:   Schizophrenia, paranoid (HCC) Active Problems:   Tobacco use disorder   Cannabis use disorder   Cocaine use disorder, severe, dependence (HCC)  Long Term Goal(s): Improvement in symptoms so as ready for discharge   Short Term Goals: Ability to identify changes in lifestyle to reduce recurrence of condition will improve Ability to verbalize feelings will improve     Medication Management: Evaluate patient's response, side effects, and tolerance of  medication regimen.  Therapeutic Interventions: 1 to 1 sessions, Unit Group sessions and Medication administration.  Evaluation of Outcomes: Progressing   RN Treatment Plan for Primary Diagnosis: Schizophrenia, paranoid (HCC) Long Term Goal(s): Knowledge of disease and therapeutic regimen to maintain health will improve  Short Term Goals: Ability to remain free from injury will improve, Ability to verbalize frustration and anger appropriately will improve, Ability to demonstrate self-control, Ability to participate in decision making will improve, Ability to verbalize feelings will improve, Ability to disclose and discuss suicidal ideas, Ability to identify and develop effective coping behaviors will improve, and Compliance with prescribed medications will improve  Medication Management: RN will administer medications as ordered by provider, will assess and evaluate patient's response and provide education to patient for prescribed medication. RN will report any adverse and/or side effects to prescribing provider.  Therapeutic Interventions: 1 on 1 counseling sessions, Psychoeducation, Medication administration, Evaluate responses to treatment, Monitor vital signs and CBGs as ordered, Perform/monitor CIWA, COWS, AIMS and Fall Risk screenings as ordered, Perform wound care treatments as ordered.  Evaluation of Outcomes: Not Progressing   LCSW Treatment Plan for Primary Diagnosis: Schizophrenia, paranoid (HCC) Long Term Goal(s): Safe transition to appropriate next level of care at discharge, Engage patient in therapeutic group addressing interpersonal concerns.  Short Term Goals: Engage patient in aftercare planning with referrals and resources, Increase social support, Increase ability to appropriately verbalize feelings, Increase emotional regulation, Facilitate acceptance of mental health diagnosis and concerns, Facilitate patient progression through stages of change regarding substance use  diagnoses and concerns, Identify triggers associated with mental health/substance abuse issues, and Increase skills for wellness and recovery  Therapeutic Interventions: Assess for all discharge needs, 1 to 1 time with Social worker, Explore available resources and support systems, Assess for adequacy in community support network, Educate family and significant other(s) on suicide prevention, Complete Psychosocial Assessment, Interpersonal group therapy.  Evaluation of Outcomes: Not Progressing   Progress in Treatment: Attending groups: No. Participating in groups: No. Taking medication as prescribed: Yes. Toleration medication: Yes. Family/Significant other contact made: No, will contact:  Luisa Hart ( uncle )  Patient understands diagnosis: No. Discussing patient identified problems/goals with staff: Yes. Medical problems stabilized or resolved: Yes. Denies suicidal/homicidal ideation: Yes. Issues/concerns per patient self-inventory: No.   New problem(s) identified: No, Describe:  None reported   New Short Term/Long Term Goal(s): medication stabilization, elimination of SI thoughts, development of comprehensive mental wellness plan.    Patient Goals:  " get well and leave "   Discharge Plan or Barriers: Patient recently admitted. CSW will continue to follow and assess for appropriate referrals and possible discharge planning.    Reason for Continuation of Hospitalization: Hallucinations Mania Medication stabilization  Estimated Length of Stay: 5-7 days   Last 3 [place] Suicide Severity Risk Score: Flowsheet Row [facility] (Current[facility] Most recent reading at 09/03/2022  8:15 PM ED from 09/03/2022 in [facility] Most recent reading at 09/03/2022  1:04 AM ED from 07/02/2022 in [facility] at [facility] Most recent reading at 07/02/2022  5:21 PM  C-SSRS RISK  CATEGORY No Risk No Risk No Risk       Last PHQ 2/9 Scores:     No data to display          Scribe for Treatment Team: Beather Arbour 09/05/2022 11:43 AM

## 2022-09-05 NOTE — BHH Group Notes (Signed)
Spirituality group facilitated by Chaplain Katy Claussen, B[facility].   Group Description: Group focused on topic of hope. Patients participated in facilitated discussion around topic, connecting with one another around experiences and definitions for hope. Group members engaged with visual explorer photos, reflecting on what hope looks like for them today. Group engaged in discussion around how their definitions of hope are present today in hospital.   Modalities: Psycho-social ed, Adlerian, Narrative, MI   Patient Progress: Did not attend.  

## 2022-09-05 NOTE — Progress Notes (Signed)
Pt given heat pack for discomfort in his lower back.

## 2022-09-05 NOTE — Progress Notes (Signed)
   09/05/22 0600  15 Minute Checks  Location Bedroom  Visual Appearance Calm  Behavior Sleeping  Sleep (Behavioral Health Patients Only)  Calculate sleep? (Click Yes once per 24 hr at 0600 safety check) Yes  Documented sleep last 24 hours 9.5
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
## 2022-09-05 NOTE — Progress Notes (Signed)
[facility] MD Progress Note  09/05/2022 7:30 AM Kenneth Jensen  MRN:  [id number]  Principal Problem: Schizophrenia, paranoid (HCC) Diagnosis: Principal Problem:   Schizophrenia, paranoid (HCC) Active Problems:   Tobacco use disorder   Cannabis use disorder   Cocaine use disorder, severe, dependence (HCC)   Reason for Admission:  Kenneth Jensen is a 32 y.o. male  with a past psychiatric history of schizoaffective disorder, bipolar type polysubstance use disorder (tobacco, canabis, cocaine), multiple ED visits, and psychiatric hospitalizations (last 07/09/2021). He is established with [facility] ACT team. Patient initially arrived to [facility] voluntarily on 09/03/2022 for worsening psychosis, and admitted to [facility] under IVC on 09/03/2022 for substance related issues and stabilization of acute on chronic psychiatric conditions. PMHx is significant for prediabetes. UDS positive for cocaine and THC.  (admitted on 09/03/2022, total  LOS: 2 days )   Yesterday, the psychiatry team made following recommendations:  Restart home Depakote DR 1000 mg nightly for mood stabilization Decrease Seroquel to 100 mg nightly to 100 mg nightly, for psychosis with added benefit of sleep Start prazosin 1 mg nightly for nightmares   Pertinent information discussed during bed progression: Patient has had difficulty getting out of bed and participating in unit milieu, often request that medications be brought to him.   PRNs required overnight: Trazodone 1x, Nicotine gum 1x, atarax 1x  Information Obtained Today During Patient Interview:  Patient evaluated on the unit. Reports sleep quality improved, less sedated. Prazosin has successfully addressed his nightmares.  Shares that he is less sedated compared to yesterday, has been able to participate in unit milieu and agrees with the decrease in his Seroquel.  Today he reports his paranoia has mostly resolved.  Historically, he reports that when he becomes agitated he begins  to experience auditory hallucinations.  Admits to experiencing these hallucinations intermittently yesterday when he was "getting worked up".  Denies command hallucinations, describes hearing muffled voices in the background.  I discussed patient's history of substance use, which he recognizes is a problem.  He admits that he will often use cocaine "to fill the loneliness".    Reports goals for today include "keep getting better".   On interview, suicidal ideations are not present . Homicidal ideations are not present.   There are no auditory hallucinations, visual hallucinations, paranoid ideations, or delusional thought processes.   Side effects to currently prescribed medications are : none. There are no somatic complaints. Reports regular bowel movements.  He appears to have some lower back pain that is painful on palpation, amenable to having a lidocaine patch as needed.    Past Psychiatric Hx: Patient receives psychiatric services through [facility] ACT team in [place]. Previous Psychiatric Diagnoses:  Per ACTT - Schizophrenia, paranoid ; no hx of bipolar d/o; cannabis use disorder, moderate; cocaine use disorder, mild Current psychiatric medications: "Per [facility] ACT team lead, Kenneth Jensen, patient is prescribed Depakote ER 500 mg TID as well as Gean Birchwood IM monthly and Seroquel 200 mg at bedtime. Team lead reports that patient is noncompliant with medications at times. " LAI: Invegga Sustenna 234 mg monthly, last injection 08/13/2022  Depakote ER 1,000 mg at bedtime quetiapine 200 mg daily  Psychiatric medication history/compliance: ACTT confirms is historically noncompliant. Psychiatric Hospitalization hx: Hospitalized at [facility] on 7//2023-07/16/2021 Hospitalized at [facility] on 05/07/2018 23-5//2023 Hospitalized at [facility] on 04/22/2021-04/25/2021 Psychotherapy hx: None identified on chart review Neuromodulation history: None identified on chart review History of suicide (obtained from HPI): In  previous psychiatric hospitalizations, has made subjective  reports of SI.  No history of times identified on chart review History of homicide or aggression (obtained in HPI): Has had episodes of aggression towards family, documented back in 2021   Substance Abuse Hx: Alcohol: Denies Tobacco: 0.5 PPD since age 70 Cannabis: Smokes 1 blunt daily, since age of 29 Other Illicit drugs: Reports daily cocaine use, via snorting, amount not specified.  Denies IV drug use Rx drug abuse: Denies Rehab hx: Patient denies   Past Medical History: PCP: Denies Medical Dx: Pt denies any chronic medical conditions. B12 deficiency and Pre-diabetes onc hart review Medications: Denies Allergies: Zyprexa-reports throat closing up on this medication, reaction Haldol on chart review as well Hospitalizations: Denies Surgeries:Denies Trauma: No TBI or concussions identified on chart review Seizures: ACTT is not aware of this. 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 Has family int he area, they don't check on him.  Legal Guardian: DSS open case for guardianship. No LG currently. Education: completed 10th grade Occupational hx: unemployed, receive SSI Marital Status: Single Children: None Legal: Patient denies, ACTT not aware of any legal hx. Has told team he has gone to jail in past. Military: No    Access to firearms: Patient, ACTT not aware of any in the home. Past Medical History:  Past Medical History:  Diagnosis Date   Asthma    Bipolar 1 disorder (HCC)    Depression    Schizophrenia (HCC)    Schizotaxia    Family History: History reviewed. No pertinent family  history.  Current Medications: Current Facility-Administered Medications  Medication Dose Route Frequency Provider Last Rate Last Admin   alum & mag hydroxide-simeth (MAALOX/MYLANTA) 200-200-20 MG/5ML suspension 30 mL  30 mL Oral Q4H PRN Bennett, Christal H, NP       diphenhydrAMINE (BENADRYL) capsule 50 mg  50 mg Oral BID PRN Bennett, Christal H, NP       Or   diphenhydrAMINE (BENADRYL) injection 50 mg  50 mg Intramuscular BID PRN Bennett, Christal H, NP       divalproex (DEPAKOTE ER) 24 hr tablet 1,000 mg  1,000 mg Oral QHS Carrion-Carrero, Antoino Westhoff, MD       hydrOXYzine (ATARAX) tablet 50 mg  50 mg Oral TID PRN Willeen Cass, Christal H, NP   50 mg at 09/04/22 2043   LORazepam (ATIVAN) tablet 2 mg  2 mg Oral TID PRN Bennett, Christal H, NP       Or   LORazepam (ATIVAN) injection 2 mg  2 mg Intramuscular BID PRN Bennett, Christal H, NP       magnesium hydroxide (MILK OF MAGNESIA) suspension 30 mL  30 mL Oral Daily PRN Bennett, Christal H, NP       nicotine polacrilex (NICORETTE) gum 2 mg  2 mg Oral PRN Lauro Franklin, MD   2 mg at 09/04/22 2043   pneumococcal 20-valent conjugate vaccine (PREVNAR 20) injection 0.5 mL  0.5 mL Intramuscular Tomorrow-1000 Massengill, Nathan, MD       prazosin (MINIPRESS) capsule 1 mg  1 mg Oral QHS Carrion-Carrero, Halston Fairclough, MD   1 mg at 09/04/22 2043   QUEtiapine (SEROQUEL) tablet 100 mg  100 mg Oral QHS Lorri Frederick, MD  100 mg at 09/04/22 2043   traZODone (DESYREL) tablet 50 mg  50 mg Oral QHS PRN Willeen Cass, Christal H, NP   50 mg at 09/04/22 2043    Lab Results: No results found for this or any previous visit (from the past 48 hour(s)).  Blood Alcohol level:  Lab Results  Component Value Date   ETH <10 09/03/2022   ETH <10 07/02/2022    Metabolic Labs: Lab Results  Component Value Date   HGBA1C 5.7 (H) 06/05/2022   MPG 116.89 04/23/2021   MPG 116.89 05/15/2020   No results found for: "PROLACTIN" Lab Results  Component Value Date    CHOL 177 06/05/2022   TRIG 163 (H) 06/05/2022   HDL 41 06/05/2022   CHOLHDL 4.3 06/05/2022   VLDL 25 04/23/2021   LDLCALC 107 (H) 06/05/2022   LDLCALC 77 04/23/2021    Sleep:Sleep: Fair   Physical Findings: AIMS: No  CIWA:    COWS:     Psychiatric Specialty Exam:  Presentation  General Appearance: Disheveled  Eye Contact:None (eyes closed)  Speech:Clear and Coherent; Normal Rate  Speech Volume:Decreased  Handedness:-- (not formally assessed)   Mood and Affect  Mood:-- ("Fine")  Affect:Flat   Thought Process  Thought Processes:Linear; Coherent  Descriptions of Associations:Intact  Orientation:-- (not formally assessed)  Thought Content:Logical; Paranoid Ideation  History of Schizophrenia/Schizoaffective disorder:Yes  Duration of Psychotic Symptoms:Greater than six months  Hallucinations:Hallucinations: None  Ideas of Reference:None  Suicidal Thoughts:Suicidal Thoughts: No  Homicidal Thoughts:Homicidal Thoughts: No   Sensorium  Memory:Immediate Fair; Recent Fair; Remote Fair  Judgment: Fair Insight:Improved from prior   Executive Functions  Concentration:Poor  Attention Span:Poor  Recall:Poor  Fund of Knowledge:Fair  Language:Fair   Psychomotor Activity  Psychomotor Activity:Psychomotor Activity: Normal   Assets  Assets:Communication Skills; Resilience   Sleep  Sleep:Sleep: Fair    Physical Exam: Physical Exam Vitals and nursing note reviewed.  Constitutional:      General: He is not in acute distress.    Appearance: He is not ill-appearing.  HENT:     Head: Normocephalic and atraumatic.  Pulmonary:     Effort: Pulmonary effort is normal. No respiratory distress.  Musculoskeletal:        General: Normal range of motion.  Skin:    General: Skin is warm and dry.  Neurological:     General: No focal deficit present.    Review of Systems  Constitutional: Negative.   Cardiovascular: Negative.   Gastrointestinal:  Negative.   Genitourinary: Negative.   Neurological: Negative.    Blood pressure (!) 125/53, pulse 73, temperature (!) 97.2 F (36.2 C), temperature source Oral, resp. rate 16, height 5\' 6"  (1.676 m), weight 96.2 kg, SpO2 100%. Body mass index is 34.22 kg/m.  Treatment Plan Summary: Daily contact with patient to assess and evaluate symptoms and progress in treatment and Medication management   ASSESSMENT: SEDARIUS POLEN is a 32 y.o. male  with a past psychiatric history of schizoaffective disorder, bipolar type polysubstance use disorder (tobacco, canabis, cocaine), multiple ED visits, and psychiatric hospitalizations (last 07/09/2021). He is established with [facility] ACT team. Patient initially arrived to [facility] voluntarily on 09/03/2022 for worsening psychosis, and admitted to [facility] under IVC on 09/03/2022 for substance related issues and stabilization of acute on chronic psychiatric conditions. PMHx is significant for prediabetes. UDS positive for cocaine and THC.    We will uphold IVC.   Diagnoses / Active Problems: Schizophrenia, paranoid R/o PTSD Stimulant use disorder, severe Cannabis use disorder Tobacco  use disorder     PLAN: Safety and Monitoring:             -- INVOLUNTARY  admission to inpatient psychiatric unit for safety, stabilization and treatment             -- Daily contact with patient to assess and evaluate symptoms and progress in treatment             -- Patient's case to be discussed in multi-disciplinary team meeting             -- Observation Level : q15 minute checks             -- Vital signs:  q12 hours             -- Precautions: suicide, elopement, and assault   2. Psychiatric Diagnoses and Treatment:  Continue home Depakote DR 1000 mg nightly for mood stabilization  Continue Seroquel 100 mg nightly, for psychosis with added benefit of sleep  Continue prazosin 1 mg nightly for nightmares  Continue invega 234 mg qmonthly, next dose due 9/7 -- The  risks/benefits/side-effects/alternatives to this medication were discussed in detail with the patient and time was given for questions. The patient consents to medication trial.              -- Metabolic profile and EKG monitoring obtained while on an atypical antipsychotic  BMI: 34.22 kg/m^2 TSH: pending  Lipid Panel: Tgs 163, LDL 107 on 06/06/2022 HbgA1c: 5.7% on 06/06/2022 QTc: 457 on 09/03/2022             -- Encouraged patient to participate in unit milieu and in scheduled group therapies              -- Short Term Goals: Ability to identify changes in lifestyle to reduce recurrence of condition will improve and Ability to verbalize feelings will improve             -- Long Term Goals: Improvement in symptoms so as ready for discharge Other PRNS: Maalox/Mylanta, Atarax, milk of magnesia, Nicorette, trazodone, agitation (Benadryl, Ativan, Zyprexa)              3. Medical Issues Being Addressed:  #Tobacco Use Disorder  Nicorette gum Smoking cessation encouraged     4. Discharge Planning:              -- Social work and case management to assist with discharge planning and identification of hospital follow-up needs prior to discharge             -- Estimated LOS: 5-7 days             -- Discharge Concerns: Need to establish a safety plan; Medication compliance and effectiveness             -- Discharge Goals: Return home with outpatient referrals for mental health follow-up including medication management/psychotherapy    I certify that inpatient services furnished can reasonably be expected to improve the patient's condition.   This note was created using a voice recognition software as a result there may be grammatical errors inadvertently enclosed that do not reflect the nature of this encounter. Every attempt is made to correct such errors.   Dr. Liston Alba, MD PGY-2, Psychiatry Residency  8/30/20247:30 AM

## 2022-09-05 NOTE — Group Note (Signed)
Recreation Therapy Group Note   Group Topic:Team Building  Group Date: 09/05/2022 Start Time: 1000 End Time: 1026 Facilitators: Alexandra Lipps-McCall, LRT,CTRS Location: [address] Dayroom   Goal Area(s) Addresses:  Patient will effectively work with peer towards shared goal.  Patient will identify skills used to make activity successful.  Patient will identify how skills used during activity can be applied to reach post d/c goals.   Group Description: [employer]. In teams of 5-6, patients were given 11 craft pipe cleaners. Using the materials provided, patients were instructed to compete again the opposing team(s) to build the tallest free-standing structure from floor level. The activity was timed; difficulty increased by Clinical research associate as Production designer, theatre/television/film continued.  Systematically resources were removed with additional directions for example, placing one arm behind their back, working in silence, and shape stipulations. LRT facilitated post-activity discussion reviewing team processes and necessary communication skills involved in completion. Patients were encouraged to reflect how the skills utilized, or not utilized, in this activity can be incorporated to positively impact support systems post discharge.   Affect/Mood: Appropriate   Participation Level: Engaged   Participation Quality: Independent   Behavior: Appropriate   Speech/Thought Process: Focused   Insight: Poor   Judgement: Poor   Modes of Intervention: STEM Activity   Patient Response to Interventions:  Engaged   Education Outcome:  In group clarification offered    Clinical Observations/Individualized Feedback: Pt was still waking up when he arrived to group. LRT explained the activity to pt. Pt asked questions for clarification and then began working with peer. Pt was engaged and on task. Pt was called out of group to meet with doctor and did not return.     Plan: Continue to engage patient in RT  group sessions 2-3x/week.   Chanay Nugent-McCall, LRT,CTRS 09/05/2022 11:07 AM

## 2022-09-05 NOTE — Progress Notes (Signed)
Recreation Therapy Notes  INPATIENT RECREATION THERAPY ASSESSMENT  Patient Details Name: Kenneth Jensen MRN: [id number] DOB: May 23, 1990 Today's Date: 09/05/2022       Information Obtained From: Patient  Able to Participate in Assessment/Interview: Yes  Patient Presentation: Alert  Reason for Admission (Per Patient): Other (Comments) (IVC'd)  Patient Stressors: Other (Comment) ("the IVC")  Coping Skills:   Isolation, Sports, TV, Music, Exercise, Meditate, Substance Abuse, Prayer, Other (Comment), Avoidance, Hot Bath/Shower (Walk; Phone)  Leisure Interests (2+):  Individual - Reading  Frequency of Recreation/Participation: Weekly  Awareness of Community Resources:  No  Expressed Interest in [employer] Information: No  [place]idence[place]  Patient Main Form of Transportation: Therapist, music  Patient Strengths:  "looking up stuff"  Patient Identified Areas of Improvement:  "schooling"  Patient Goal for Hospitalization:  "to get better"  Current SI (including self-harm):  No  Current HI:  No  Current AVH: No  Staff Intervention Plan: Group Attendance, Collaborate with Interdisciplinary Treatment Team  Consent to Intern Participation: N/A   Winter Trefz-McCall, LRT,CTRS Sitara Cashwell A Moneisha Vosler-McCall 09/05/2022, 12:24 PM

## 2022-09-06 DIAGNOSIS — F200 Paranoid schizophrenia: Secondary | ICD-10-CM

## 2022-09-06 LAB — TSH: TSH: 3.19 u[IU]/mL (ref 0.350–4.500)

## 2022-09-06 NOTE — [facility] Suicide Risk Assessment (Signed)
[facility] INPATIENT:  Family/Significant Other Suicide Prevention Education  Suicide Prevention Education:  Contact Attempts: Antaeus Rideout , [phone]  [phone]) has been identified by the patient as the family member/significant other with whom the patient will be residing, and identified as the person(s) who will aid the patient in the event of a mental health crisis.  With written consent from the patient, two attempts were made to provide suicide prevention education, prior to and/or following the patient's discharge.  We were unsuccessful in providing suicide prevention education.  A suicide education pamphlet was given to the patient to share with family/significant other.  Date and time of first attempt:08//31/24 / 2:15 PM   Brannen Koppen O Zoe Goonan 09/06/2022, 2:13 PM

## 2022-09-06 NOTE — Progress Notes (Signed)
Per MHT, Pt stood up to a peer during lunch and told them to get up from a particular chair. Pt presents with poor boundaries and is intrusive on the unit. For this reason, Pt was placed on a UR for 24 hrs.

## 2022-09-06 NOTE — Progress Notes (Signed)
   09/06/22 0602  15 Minute Checks  Location Bedroom  Visual Appearance Calm  Behavior Sleeping  Sleep (Behavioral Health Patients Only)  Calculate sleep? (Click Yes once per 24 hr at 0600 safety check) Yes  Documented sleep last 24 hours 7

## 2022-09-06 NOTE — Progress Notes (Deleted)
Pt refused to have labs drawn this morning, stating he did not sleep well and didn't want to get up.

## 2022-09-06 NOTE — Group Note (Signed)
Date:  09/06/2022 Time:  8:58 PM  Group Topic/Focus:  Wrap-Up Group:   The focus of this group is to help patients review their daily goal of treatment and discuss progress on daily workbooks.    Participation Level:  Active  Participation Quality:  Appropriate  Affect:  Appropriate  Cognitive:  Appropriate  Insight: Appropriate  Engagement in Group:  Developing/Improving  Modes of Intervention:  Discussion  Additional Comments:  Pt stated his goal for today was to focus on his treatment plan. Pt stated he accomplished his goal today. Pt stated he talked with his doctor and social worker about his care today. Pt rated his overall day a 7 out of 10. Pt stated he was able to contact his cousin today which improved his day. Pt stated he felt better about himself today. Pt stated he was able to attend all meals. Pt stated he took all medications provided today. Pt stated he attend all groups held today. Pt stated his appetite was pretty good today. Pt rated sleep last night was poor. Pt stated the goal tonight was to get some rest. Pt stated he had no physical pain tonight. Pt deny visual hallucinations and auditory issues tonight. Pt denies thoughts of harming himself or others. Pt stated he would alert staff if anything changed  Dexter Wilbourne 09/06/2022, 8:58 PM

## 2022-09-06 NOTE — Plan of Care (Signed)
  Problem: Education: Goal: Emotional status will improve Outcome: Progressing Goal: Mental status will improve Outcome: Progressing   Problem: Activity: Goal: Sleeping patterns will improve Outcome: Progressing   

## 2022-09-06 NOTE — Progress Notes (Signed)
[facility] MD Progress Note  09/06/2022  Kenneth Jensen  MRN:  [id number]  Principal Problem: Schizophrenia, paranoid (HCC) Diagnosis: Principal Problem:   Schizophrenia, paranoid (HCC) Active Problems:   Tobacco use disorder   Cannabis use disorder   Cocaine use disorder, severe, dependence (HCC)   Reason for Admission:  Kenneth Jensen is a 32 y.o. male  with a past psychiatric history of schizoaffective disorder, bipolar type polysubstance use disorder (tobacco, canabis, cocaine), multiple ED visits, and psychiatric hospitalizations (last 07/09/2021). He is established with [facility] ACT team. Patient initially arrived to [facility] voluntarily on 09/03/2022 for worsening psychosis, and admitted to [facility] under IVC on 09/03/2022 for substance related issues and stabilization of acute on chronic psychiatric conditions. PMHx is significant for prediabetes. UDS positive for cocaine and THC.  (admitted on 09/03/2022, total  LOS: 3 days )  SUBJECTIVE: Case discussed with staff in multidisciplinary meeting today, chart reviewed, patient seen during rounds.  Patient reports he is doing fine today.  Patient was cooperative with the interview although his affect was flat.  He made fair eye contact.  He denies thoughts of harming himself the.  He denies auditory visual hallucinations.  He was hypervigilant and was scanning the environment during assessment.  Patient reports that his goal is to feel better and get his GED.  Patient was provided with support and reassurance.  He was encouraged to attend groups when work on coping strategies.   Past Psychiatric Hx: Patient receives psychiatric services through [facility] ACT team in [place]. Previous Psychiatric Diagnoses:  Per ACTT - Schizophrenia, paranoid ; no hx of bipolar d/o; cannabis use disorder, moderate; cocaine use disorder, mild Current psychiatric medications: "Per [facility] ACT team lead, Darius, patient is prescribed Depakote ER 500 mg TID as well as Gean Birchwood IM monthly and Seroquel 200 mg at bedtime. Team lead reports that patient is noncompliant with medications at times. " LAI: Invegga Sustenna 234 mg monthly, last injection 08/13/2022  Depakote ER 1,000 mg at bedtime quetiapine 200 mg daily  Psychiatric medication history/compliance: ACTT confirms is historically noncompliant. Psychiatric Hospitalization hx: Hospitalized at [facility] on 7//2023-07/16/2021 Hospitalized at [facility] on 05/07/2018 23-5//2023 Hospitalized at [facility] on 04/22/2021-04/25/2021 Psychotherapy hx: None identified on chart review Neuromodulation history: None identified on chart review History of suicide (obtained from HPI): In previous psychiatric hospitalizations, has made subjective reports of SI.  No history of times identified on chart review History of homicide or aggression (obtained in HPI): Has had episodes of aggression towards family, documented back in 2021   Substance Abuse Hx: Alcohol: Denies Tobacco: 0.5 PPD since age 67 Cannabis: Smokes 1 blunt daily, since age of 71 Other Illicit drugs: Reports daily cocaine use, via snorting, amount not specified.  Denies IV drug use Rx drug abuse: Denies Rehab hx: Patient denies   Past Medical History: PCP: Denies Medical Dx: Pt denies any chronic medical conditions. B12 deficiency and Pre-diabetes onc hart review Medications: Denies Allergies: Zyprexa-reports throat closing up on this medication, reaction Haldol on chart review as well Hospitalizations: Denies Surgeries:Denies Trauma: No TBI or concussions identified on chart review Seizures: ACTT is not aware of this. None identified on chart review     Family Medical History: Unknown to patient   Family Psychiatric History: Psychiatric Dx: mother -bipolar, reports he has a cousin with schizophrenia from his father's side Suicide Hx: Pt denies Violence/Aggression: Pt denies Substance use: Pt denies   Social History: Living Situation: Engineer, maintenance, apartment  complex. Lives by himself. Has  family int he area, they don't check on him.  Legal Guardian: DSS open case for guardianship. No LG currently. Education: completed 10th grade Occupational hx: unemployed, receive SSI Marital Status: Single Children: None Legal: Patient denies, ACTT not aware of any legal hx. Has told team he has gone to jail in past. Military: No    Access to firearms: Patient, ACTT not aware of any in the home. Past Medical History:  Past Medical History:  Diagnosis Date   Asthma    Bipolar 1 disorder (HCC)    Depression    Schizophrenia (HCC)    Schizotaxia    Family History: History reviewed. No pertinent family history.  Current Medications: Current Facility-Administered Medications  Medication Dose Route Frequency Provider Last Rate Last Admin   alum & mag hydroxide-simeth (MAALOX/MYLANTA) 200-200-20 MG/5ML suspension 30 mL  30 mL Oral Q4H PRN Bennett, Christal H, NP       diphenhydrAMINE (BENADRYL) capsule 50 mg  50 mg Oral BID PRN Bennett, Christal H, NP       Or   diphenhydrAMINE (BENADRYL) injection 50 mg  50 mg Intramuscular BID PRN Bennett, Christal H, NP       divalproex (DEPAKOTE ER) 24 hr tablet 1,000 mg  1,000 mg Oral QHS Carrion-Carrero, Margely, MD   1,000 mg at 09/05/22 2036   hydrOXYzine (ATARAX) tablet 50 mg  50 mg Oral TID PRN Willeen Cass, Christal H, NP   50 mg at 09/05/22 2036   lidocaine (LIDODERM) 5 % 1 patch  1 patch Transdermal Daily PRN Lorri Frederick, MD   1 patch at 09/05/22 2038   LORazepam (ATIVAN) tablet 2 mg  2 mg Oral TID PRN Bennett, Christal H, NP       Or   LORazepam (ATIVAN) injection 2 mg  2 mg Intramuscular BID PRN Bennett, Christal H, NP       magnesium hydroxide (MILK OF MAGNESIA) suspension 30 mL  30 mL Oral Daily PRN Bennett, Christal H, NP       nicotine polacrilex (NICORETTE) gum 2 mg  2 mg Oral PRN Lauro Franklin, MD   2 mg at 09/04/22 2043   prazosin (MINIPRESS) capsule 1 mg  1 mg Oral QHS  Carrion-Carrero, Margely, MD   1 mg at 09/05/22 2037   QUEtiapine (SEROQUEL) tablet 100 mg  100 mg Oral QHS Carrion-Carrero, Margely, MD   100 mg at 09/05/22 2037   traZODone (DESYREL) tablet 50 mg  50 mg Oral QHS PRN Willeen Cass, Christal H, NP   50 mg at 09/05/22 2036    Lab Results:  Results for orders placed or performed during the hospital encounter of 09/03/22 (from the past 48 hour(s))  TSH     Status: None   Collection Time: 09/06/22  6:40 AM  Result Value Ref Range   TSH 3.190 0.350 - 4.500 uIU/mL    Comment: Performed by a 3rd Generation assay with a functional sensitivity of <=0.01 uIU/mL. Performed at [facility], [address] [place], [place] [address]     Blood Alcohol level:  Lab Results  Component Value Date   [facility] <10 09/03/2022   ETH <10 07/02/2022    Metabolic Labs: Lab Results  Component Value Date   HGBA1C 5.7 (H) 06/05/2022   MPG 116.89 04/23/2021   MPG 116.89 05/15/2020   No results found for: "PROLACTIN" Lab Results  Component Value Date   CHOL 177 06/05/2022   TRIG 163 (H) 06/05/2022   HDL 41 06/05/2022  CHOLHDL 4.3 06/05/2022   VLDL 25 04/23/2021   LDLCALC 107 (H) 06/05/2022   LDLCALC 77 04/23/2021    Sleep:Improved   Physical Findings: AIMS: No  Psychiatric Specialty Exam:  Presentation  General Appearance: Poor grooming  Eye Contact:Fair  Speech:Clear and Coherent; Normal Rate  Speech Volume:Decreased  Mood and Affect  Mood:"Ok"  Affect:Flat   Thought Process  Thought Processes:Linear; Coherent Slow  Descriptions of Associations:Intact  Orientation: Oriented to place and person  Thought Content:Paranoia  History of Schizophrenia/Schizoaffective disorder:Yes  Duration of Psychotic Symptoms:Greater than six months  Hallucinations:Denies  Ideas of Reference:Paranoia  Suicidal Thoughts:Denies  Homicidal Thoughts:Denies   Sensorium  Memory:Immediate Fair; Recent Fair; Remote  Fair  Judgment: Limited  Insight:Limited   Executive Functions  Concentration:Poor  Attention Span:Poor  Recall:Poor  Fund of Knowledge:Fair  Language:Fair   Psychomotor Activity  Psychomotor Activity:Decreased   Assets  Assets:Communication Skills; Resilience   Sleep  Sleep:Improved    Physical Exam: Physical Exam Vitals and nursing note reviewed.  Constitutional:      General: He is not in acute distress.    Appearance: He is not ill-appearing.  HENT:     Head: Normocephalic and atraumatic.  Pulmonary:     Effort: Pulmonary effort is normal. No respiratory distress.  Musculoskeletal:        General: Normal range of motion.  Skin:    General: Skin is warm and dry.  Neurological:     General: No focal deficit present.    Review of Systems  Constitutional: Negative.   Cardiovascular: Negative.   Gastrointestinal: Negative.   Genitourinary: Negative.   Neurological: Negative.    Blood pressure 121/75, pulse 75, temperature 98.2 F (36.8 C), temperature source Oral, resp. rate 18, height 5\' 6"  (1.676 m), weight 96.2 kg, SpO2 100%. Body mass index is 34.22 kg/m.  Treatment Plan Summary: Daily contact with patient to assess and evaluate symptoms and progress in treatment and Medication management   ASSESSMENT: Kenneth Jensen is a 32 y.o. male  with a past psychiatric history of schizoaffective disorder, bipolar type polysubstance use disorder (tobacco, canabis, cocaine), multiple ED visits, and psychiatric hospitalizations (last 07/09/2021). He is established with [facility] ACT team. Patient initially arrived to [facility] voluntarily on 09/03/2022 for worsening psychosis, and admitted to [facility] under IVC on 09/03/2022 for substance related issues and stabilization of acute on chronic psychiatric conditions. PMHx is significant for prediabetes. UDS positive for cocaine and THC.    We will uphold IVC.   Diagnoses / Active Problems: Schizophrenia, paranoid R/o  PTSD Stimulant use disorder, severe Cannabis use disorder Tobacco use disorder     PLAN: Safety and Monitoring:             -- INVOLUNTARY  admission to inpatient psychiatric unit for safety, stabilization and treatment             -- Daily contact with patient to assess and evaluate symptoms and progress in treatment             -- Patient's case to be discussed in multi-disciplinary team meeting             -- Observation Level : q15 minute checks             -- Vital signs:  q12 hours             -- Precautions: suicide, elopement, and assault   2. Psychiatric Diagnoses and Treatment:  Continue home Depakote DR 1000 mg nightly for mood  stabilization  Continue Seroquel 100 mg nightly, for psychosis with added benefit of sleep  Continue prazosin 1 mg nightly for nightmares  Continue invega 234 mg qmonthly, next dose due 9/7 -- The risks/benefits/side-effects/alternatives to this medication were discussed in detail with the patient and time was given for questions. The patient consents to medication trial.              -- Metabolic profile and EKG monitoring obtained while on an atypical antipsychotic  BMI: 34.22 kg/m^2 TSH: pending  Lipid Panel: Tgs 163, LDL 107 on 06/06/2022 HbgA1c: 5.7% on 06/06/2022 QTc: 457 on 09/03/2022             -- Encouraged patient to participate in unit milieu and in scheduled group therapies              -- Short Term Goals: Ability to identify changes in lifestyle to reduce recurrence of condition will improve and Ability to verbalize feelings will improve             -- Long Term Goals: Improvement in symptoms so as ready for discharge Other PRNS: Maalox/Mylanta, Atarax, milk of magnesia, Nicorette, trazodone, agitation (Benadryl, Ativan, Zyprexa)              3. Medical Issues Being Addressed:  #Tobacco Use Disorder  Nicorette gum Smoking cessation encouraged     4. Discharge Planning:              -- Social work and case management to assist with  discharge planning and identification of hospital follow-up needs prior to discharge             -- Estimated LOS: 5-7 days             -- Discharge Concerns: Need to establish a safety plan; Medication compliance and effectiveness             -- Discharge Goals: Return home with outpatient referrals for mental health follow-up including medication management/psychotherapy    I certify that inpatient services furnished can reasonably be expected to improve the patient's condition.

## 2022-09-06 NOTE — Progress Notes (Signed)
Kenneth Jensen was mildly irritable this morning. Pt denies SI/HI/AVH. No morning meds. No new c/o's. Pt remains safe.

## 2022-09-06 NOTE — Group Note (Signed)
Date:  09/06/2022 Time:  10:50 AM  Group Topic/Focus:  Goals Group:   The focus of this group is to help patients establish daily goals to achieve during treatment and discuss how the patient can incorporate goal setting into their daily lives to aide in recovery. Orientation:   The focus of this group is to educate the patient on the purpose and policies of crisis stabilization and provide a format to answer questions about their admission.  The group details unit policies and expectations of patients while admitted.    Participation Level:  Did Not Attend   Sonny Dandy Willet Schleifer 09/06/2022, 10:50 AM

## 2022-09-07 DIAGNOSIS — Z720 Tobacco use: Secondary | ICD-10-CM | POA: Diagnosis not present

## 2022-09-07 DIAGNOSIS — F200 Paranoid schizophrenia: Secondary | ICD-10-CM | POA: Diagnosis not present

## 2022-09-07 DIAGNOSIS — F1420 Cocaine dependence, uncomplicated: Secondary | ICD-10-CM | POA: Diagnosis not present

## 2022-09-07 DIAGNOSIS — F1290 Cannabis use, unspecified, uncomplicated: Secondary | ICD-10-CM | POA: Diagnosis not present

## 2022-09-07 MED ORDER — TRAZODONE HCL 50 MG PO TABS
50.0000 mg | ORAL_TABLET | Freq: Once | ORAL | Status: AC
  Administered 2022-09-07: 50 mg via ORAL
  Filled 2022-09-07 (×2): qty 1

## 2022-09-07 MED ORDER — QUETIAPINE FUMARATE 200 MG PO TABS
200.0000 mg | ORAL_TABLET | Freq: Every day | ORAL | Status: DC
  Administered 2022-09-07 – 2022-09-08 (×2): 200 mg via ORAL
  Filled 2022-09-07 (×4): qty 1
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
## 2022-09-07 NOTE — Progress Notes (Signed)
   09/07/22 0600  15 Minute Checks  Location Bedroom  Visual Appearance Calm  Behavior Composed  Sleep (Behavioral Health Patients Only)  Calculate sleep? (Click Yes once per 24 hr at 0600 safety check) Yes  Documented sleep last 24 hours 2
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
## 2022-09-07 NOTE — Progress Notes (Signed)
   09/06/22 2010  Psych Admission Type (Psych Patients Only)  Admission Status Involuntary  Psychosocial Assessment  Patient Complaints Anxiety  Eye Contact Fair  Facial Expression Animated  Affect Appropriate to circumstance  Speech Logical/coherent  Interaction Assertive  Motor Activity Slow  Appearance/Hygiene In scrubs  Behavior Characteristics Cooperative  Mood Preoccupied  Thought Process  Coherency Circumstantial  Content Preoccupation  Delusions None reported or observed  Perception WDL  Hallucination None reported or observed  Judgment Poor  Confusion None  Danger to Self  Current suicidal ideation? Denies  Agreement Not to Harm Self Yes  Description of Agreement Verbal  Danger to Others  Danger to Others None reported or observed

## 2022-09-07 NOTE — Progress Notes (Signed)
D- Patient alert and oriented. Denies SI, HI, AVH, and pain. Patient asking about discharge stating, "I need to go home and clean my house. My grandmother put it in her will that I can't be held at a hospital without my family knowing."   A- Scheduled medications administered to patient along with PRN trazodone and hydroxyzine, per MD orders. Support and encouragement provided.  Routine safety checks conducted every 15 minutes.  Patient informed to notify staff with problems or concerns.  R- No adverse drug reactions noted. Patient contracts for safety at this time. Patient compliant with medications and treatment plan. Patient receptive, calm, and cooperative. Patient interacts well with others on the unit.  Patient remains safe at this time.

## 2022-09-07 NOTE — Plan of Care (Signed)

## 2022-09-07 NOTE — BHH Group Notes (Signed)
Adult Psychoeducational Group Note  Date:  09/07/2022 Time:  8:55 PM  Group Topic/Focus:  Wrap-Up Group:   The focus of this group is to help patients review their daily goal of treatment and discuss progress on daily workbooks.  Participation Level:  Active  Participation Quality:  Appropriate  Affect:  Appropriate  Cognitive:  Appropriate  Insight: Appropriate  Engagement in Group:  Engaged  Modes of Intervention:  Discussion  Additional Comments:  the one positive thing he plan to discharge tomorrow or the next da y. His overall day 8. The color describe his day white.  Charna Busman Long 09/07/2022, 8:55 PM

## 2022-09-07 NOTE — Plan of Care (Signed)
  Problem: Education: Goal: Emotional status will improve Outcome: Progressing Goal: Mental status will improve Outcome: Progressing   Problem: Coping: Goal: Ability to verbalize frustrations and anger appropriately will improve Outcome: Progressing Goal: Ability to demonstrate self-control will improve Outcome: Progressing   

## 2022-09-07 NOTE — BHH Group Notes (Signed)
Adult Psychoeducational Group Note  Date:  09/07/2022 Time:  7:09 PM  Group Topic/Focus:  Goals Group:   The focus of this group is to help patients establish daily goals to achieve during treatment and discuss how the patient can incorporate goal setting into their daily lives to aide in recovery. Orientation:   The focus of this group is to educate the patient on the purpose and policies of crisis stabilization and provide a format to answer questions about their admission.  The group details unit policies and expectations of patients while admitted.  Participation Level:  Did Not Attend  Participation Quality:    Affect:    Cognitive:    Insight:   Engagement in Group:    Modes of Intervention:    Additional Comments:    Sheran Lawless 09/07/2022, 7:09 PM

## 2022-09-07 NOTE — Plan of Care (Signed)
  Problem: Education: Goal: Emotional status will improve Outcome: Progressing Goal: Mental status will improve Outcome: Progressing   Problem: Coping: Goal: Ability to verbalize frustrations and anger appropriately will improve Outcome: Progressing   

## 2022-09-08 DIAGNOSIS — F1290 Cannabis use, unspecified, uncomplicated: Secondary | ICD-10-CM | POA: Diagnosis not present

## 2022-09-08 DIAGNOSIS — F1420 Cocaine dependence, uncomplicated: Secondary | ICD-10-CM | POA: Diagnosis not present

## 2022-09-08 DIAGNOSIS — F200 Paranoid schizophrenia: Secondary | ICD-10-CM | POA: Diagnosis not present

## 2022-09-08 DIAGNOSIS — Z720 Tobacco use: Secondary | ICD-10-CM | POA: Diagnosis not present

## 2022-09-08 MED ORDER — IBUPROFEN 200 MG PO TABS: 200.0000 mg | ORAL_TABLET | Freq: Four times a day (QID) | ORAL | Status: DC | PRN

## 2022-09-08 NOTE — [facility] Group Notes (Signed)
Adult Psychoeducational Group Note  Date:  09/08/2022 Time:  9:14 PM  Group Topic/Focus:  Wrap-Up Group:   The focus of this group is to help patients review their daily goal of treatment and discuss progress on daily workbooks.  Participation Level:  Active  Participation Quality:  Redirectable  Affect:  Excited  Cognitive:  Disorganized  Insight: Good  Engagement in Group:  Distracting  Modes of Intervention:  Discussion  Additional Comments:   Pt states that he is happy due to the fact that he is off of unit restriction and will be able to go home tomorrow. Pt states that he  was able to attend group discussion and speak with his cousins today. Pt denied everything  Vevelyn Pat 09/08/2022, 9:14 PM

## 2022-09-08 NOTE — Group Note (Signed)
Recreation Therapy Group Note   Group Topic:Coping Skills  Group Date: 09/08/2022 Start Time: 1010 End Time: 1045 Facilitators: Madix Blowe-McCall, LRT,CTRS Location: [address] Dayroom   Goal Area(s) Addresses: Patient will define what a coping skill is. Patient will work to create a list of healthy coping skills beginning with each letter of the alphabet. Patient will successfully identify positive coping skills they can use post d/c.  Patient will acknowledge benefit(s) of using learned coping skills post d/c.   Group Description: Coping A to Z. Patient asked to identify what a coping skill is and when they use them. Patients with Clinical research associate discussed healthy versus unhealthy coping skills. Next patients were given a blank worksheet titled "Coping Skills A-Z". Patients were instructed to come up with at least one positive coping skill per letter of the alphabet. Patients were given 15 minutes to brainstorm before ideas were presented to the large group. Patients and LRT debriefed on the importance of coping skill selection based on situation and back-up plans when a skill tried is not effective. At the end of group, patients were given an handout of alphabetized strategies to keep for future reference.   Affect/Mood: N/A   Participation Level: Did not attend    Clinical Observations/Individualized Feedback:    Plan: Continue to engage patient in RT group sessions 2-3x/week.   Kenneth Jensen, LRT,CTRS  09/08/2022 1:04 PM

## 2022-09-08 NOTE — Progress Notes (Signed)
Pt verbalized chronic mid back pain unrelieved by the lidocaine patch. Pt requested oral PRN medication for his back pain.  Provider made aware and ibuprofen ordered PRN.

## 2022-09-08 NOTE — Progress Notes (Signed)
Patient up at nurses station agitated due to unit restriction. Patient yelling and demanding to speak to a doctor. "Why are you looking at me like that? You look like you have alien eyes." Patient given education on unit restriction policies. Patient walked away during education. Safety checks continue. Patient remains safe at this time.

## 2022-09-08 NOTE — Plan of Care (Signed)

## 2022-09-08 NOTE — Progress Notes (Cosign Needed Addendum)
[facility] MD Progress Note  09/08/2022  Kenneth Jensen  MRN:  [id number]  Principal Problem: Schizophrenia, paranoid (HCC) Diagnosis: Principal Problem:   Schizophrenia, paranoid (HCC) Active Problems:   Tobacco use disorder   Cannabis use disorder   Cocaine use disorder, severe, dependence (HCC)  Reason for Admission:  Kenneth Jensen is a 32 y.o. male  with a past psychiatric history of schizoaffective disorder, bipolar type polysubstance use disorder (tobacco, canabis, cocaine), multiple ED visits, and psychiatric hospitalizations (last 07/09/2021). He is established with [facility] ACT team. Patient initially arrived to [facility] voluntarily on 09/03/2022 for worsening psychosis, and admitted to [facility] under IVC on 09/03/2022 for substance related issues and stabilization of acute on chronic psychiatric conditions. PMHx is significant for prediabetes. UDS positive for cocaine and THC.  (admitted on 09/03/2022, total  LOS: 5 days )  Yesterday the psychiatry team made the following recommendations: Continue home Depakote DR 1000 mg nightly for mood stabilization  Increase Seroquel 200 mg nightly, for psychosis with added benefit of sleep  Continue prazosin 1 mg nightly for nightmares  Continue invega 234 mg qmonthly, next dose due 9/7  On assessment today, the pt reports that his mood is less depressed.  He is more alert, calm and oriented to person, place, and his  home address in [place] [place].  Speech clear with normal volume.  Thinking process coherent and with some relevance.  Reports that putting him on unit restriction is causing some distress and making him "wants to hallucinate auditorily".  Unit restriction discontinued today.  Patient is taking his medication without complaint.  Reports that anxiety is at manageable level Nursing staff report patient sleeping 6.75 hours through the night.   Appetite is good, patient complained about not being allowed to go to cafeteria for  food, where he can get double portions.  Report being on unit restriction is making him "wants to hallucinate."  Unit restriction rescinded by the attending psychiatrist. Concentration is improved Energy level is adequate Denies suicidal thoughts and denies suicidal intent or plan.  Denies having any HI.  Denies having psychotic symptoms today  Denies having side effects to current psychiatric medications.   We discussed compliance to current medication regimen while in the hospital and upon discharge. Made patient aware that Depakote level labs will be drawn tomorrow in the morning to check his Depakote level.  Patient is in agreement.  Also discussed with patient that his Gean Birchwood injection would be due on 09/13/2022.  Discussed the following psychosocial stressors: Of polysubstance usage, as these adversely affect overall medical and psychiatric wellbeing.  Past Psychiatric Hx: Patient receives psychiatric services through [facility] ACT team in [place]. Previous Psychiatric Diagnoses:  Per ACTT - Schizophrenia, paranoid ; no hx of bipolar d/o; cannabis use disorder, moderate; cocaine use disorder, mild Current psychiatric medications: "Per [facility] ACT team lead, Darius, patient is prescribed Depakote ER 500 mg TID as well as Gean Birchwood IM monthly and Seroquel 200 mg at bedtime. Team lead reports that patient is noncompliant with medications at times. " LAI: Invegga Sustenna 234 mg monthly, last injection 08/13/2022  Depakote ER 1,000 mg at bedtime quetiapine 200 mg daily  Psychiatric medication history/compliance: ACTT confirms is historically noncompliant. Psychiatric Hospitalization hx: Hospitalized at [facility] on 7//2023-07/16/2021 Hospitalized at [facility] on 05/07/2018 23-5//2023 Hospitalized at [facility] on 04/22/2021-04/25/2021 Psychotherapy hx: None identified on chart review Neuromodulation history: None identified on chart review History of suicide (obtained from HPI): In previous psychiatric  hospitalizations, has made subjective  reports of SI.  No history of times identified on chart review History of homicide or aggression (obtained in HPI): Has had episodes of aggression towards family, documented back in 2021   Substance Abuse Hx: Alcohol: Denies Tobacco: 0.5 PPD since age 60 Cannabis: Smokes 1 blunt daily, since age of 89 Other Illicit drugs: Reports daily cocaine use, via snorting, amount not specified.  Denies IV drug use Rx drug abuse: Denies Rehab hx: Patient denies   Past Medical History: PCP: Denies Medical Dx: Pt denies any chronic medical conditions. B12 deficiency and Pre-diabetes onc hart review Medications: Denies Allergies: Zyprexa-reports throat closing up on this medication, reaction Haldol on chart review as well Hospitalizations: Denies Surgeries:Denies Trauma: No TBI or concussions identified on chart review Seizures: ACTT is not aware of this. None identified on chart review    Family Medical History: Unknown to patient   Family Psychiatric History: Psychiatric Dx: mother -bipolar, reports he has a cousin with schizophrenia from his father's side Suicide Hx: Pt denies Violence/Aggression: Pt denies Substance use: Pt denies   Social History: Living Situation: Engineer, maintenance, apartment complex. Lives by himself. Has family int he area, they don't check on him.  Legal Guardian: DSS open case for guardianship. No LG currently. Education: completed 10th grade Occupational hx: unemployed, receive SSI Marital Status: Single Children: None Legal: Patient denies, ACTT not aware of any legal hx. Has told team he has gone to jail in past. Military: No    Access to firearms: Patient, ACTT not aware of any in the home. Past Medical History:  Past Medical History:  Diagnosis Date   Asthma    Bipolar 1 disorder (HCC)    Depression    Schizophrenia (HCC)    Schizotaxia    Family History: History reviewed. No pertinent family history.  Current  Medications: Current Facility-Administered Medications  Medication Dose Route Frequency Provider Last Rate Last Admin   alum & mag hydroxide-simeth (MAALOX/MYLANTA) 200-200-20 MG/5ML suspension 30 mL  30 mL Oral Q4H PRN Bennett, Christal H, NP       diphenhydrAMINE (BENADRYL) capsule 50 mg  50 mg Oral BID PRN Bennett, Christal H, NP       Or   diphenhydrAMINE (BENADRYL) injection 50 mg  50 mg Intramuscular BID PRN Bennett, Christal H, NP       divalproex (DEPAKOTE ER) 24 hr tablet 1,000 mg  1,000 mg Oral QHS Carrion-Carrero, Margely, MD   1,000 mg at 09/07/22 2051   hydrOXYzine (ATARAX) tablet 50 mg  50 mg Oral TID PRN Thurston Hole H, NP   50 mg at 09/07/22 2051   lidocaine (LIDODERM) 5 % 1 patch  1 patch Transdermal Daily PRN Lorri Frederick, MD   1 patch at 09/05/22 2038   LORazepam (ATIVAN) tablet 2 mg  2 mg Oral TID PRN Bennett, Christal H, NP       Or   LORazepam (ATIVAN) injection 2 mg  2 mg Intramuscular BID PRN Bennett, Christal H, NP       magnesium hydroxide (MILK OF MAGNESIA) suspension 30 mL  30 mL Oral Daily PRN Bennett, Christal H, NP       nicotine polacrilex (NICORETTE) gum 2 mg  2 mg Oral PRN Lauro Franklin, MD   2 mg at 09/07/22 1014   prazosin (MINIPRESS) capsule 1 mg  1 mg Oral QHS Carrion-Carrero, Margely, MD   1 mg at 09/07/22 2051   QUEtiapine (SEROQUEL) tablet 200 mg  200 mg Oral QHS Neville Route  L, DO   200 mg at 09/07/22 2051   traZODone (DESYREL) tablet 50 mg  50 mg Oral QHS PRN Willeen Cass, Christal H, NP   50 mg at 09/07/22 2051   Lab Results:  No results found for this or any previous visit (from the past 48 hour(s)).  Blood Alcohol level:  Lab Results  Component Value Date   ETH <10 09/03/2022   ETH <10 07/02/2022    Metabolic Labs: Lab Results  Component Value Date   HGBA1C 5.7 (H) 06/05/2022   MPG 116.89 04/23/2021   MPG 116.89 05/15/2020   No results found for: "PROLACTIN" Lab Results  Component Value Date   CHOL 177  06/05/2022   TRIG 163 (H) 06/05/2022   HDL 41 06/05/2022   CHOLHDL 4.3 06/05/2022   VLDL 25 04/23/2021   LDLCALC 107 (H) 06/05/2022   LDLCALC 77 04/23/2021    Sleep:Improved  Physical Findings: AIMS: No  Psychiatric Specialty Exam:  Presentation  General Appearance: Poor grooming  Eye Contact:Fair  Speech:Clear and Coherent  Speech Volume:Normal  Mood and Affect  Mood:"Ok"  Affect:Congruent  Thought Process  Thought Processes:Coherent Slow  Descriptions of Associations:Tangential  Orientation: Oriented to place and person  Thought Content:Paranoia  History of Schizophrenia/Schizoaffective disorder:Yes  Duration of Psychotic Symptoms:Greater than six months  Hallucinations:Denies  Ideas of Reference:Paranoia  Suicidal Thoughts:Denies  Homicidal Thoughts:Denies  Sensorium  Memory:Immediate Fair; Recent Fair  Judgment: Limited  Insight:Limited  Executive Functions  Concentration:Fair  Attention Span:Fair  Recall:Poor  Fund of Knowledge:Fair  Language:Fair  Psychomotor Activity  Psychomotor Activity:Decreased  Assets  Assets:Communication Skills; Desire for Improvement; Housing; Physical Health; Resilience  Sleep  Sleep:Improved  Physical Exam: Physical Exam Vitals and nursing note reviewed.  Constitutional:      General: He is not in acute distress.    Appearance: He is not ill-appearing.  HENT:     Head: Normocephalic and atraumatic.  Pulmonary:     Effort: Pulmonary effort is normal. No respiratory distress.  Musculoskeletal:        General: Normal range of motion.  Skin:    General: Skin is warm and dry.  Neurological:     General: No focal deficit present.    Review of Systems  Constitutional: Negative.  Negative for chills and fever.  HENT:  Negative for sore throat.   Eyes:  Negative for blurred vision.  Respiratory:  Negative for cough, shortness of breath and wheezing.   Cardiovascular: Negative.  Negative for  chest pain and palpitations.  Gastrointestinal: Negative.   Genitourinary: Negative.   Musculoskeletal: Negative.   Skin:  Negative for itching and rash.  Neurological:  Negative for dizziness, tingling and headaches.  Endo/Heme/Allergies:        See allergy listing  Psychiatric/Behavioral:  Negative for depression and suicidal ideas. The patient is not nervous/anxious and does not have insomnia.    Blood pressure 130/80, pulse 92, temperature 97.8 F (36.6 C), temperature source Oral, resp. rate 20, height 5\' 6"  (1.676 m), weight 96.2 kg, SpO2 100%. Body mass index is 34.22 kg/m.  Treatment Plan Summary: Daily contact with patient to assess and evaluate symptoms and progress in treatment and Medication management  ASSESSMENT: Kenneth Jensen is a 32 y.o. male  with a past psychiatric history of schizoaffective disorder, bipolar type polysubstance use disorder (tobacco, canabis, cocaine), multiple ED visits, and psychiatric hospitalizations (last 07/09/2021). He is established with [facility] ACT team. Patient initially arrived to [facility] voluntarily on 09/03/2022 for worsening psychosis, and admitted  to [facility] under IVC on 09/03/2022 for substance related issues and stabilization of acute on chronic psychiatric conditions. PMHx is significant for prediabetes. UDS positive for cocaine and THC.    We will uphold IVC.   Diagnoses / Active Problems: Schizophrenia, paranoid R/o PTSD Stimulant use disorder, severe Cannabis use disorder Tobacco use disorder   PLAN: Safety and Monitoring:             -- INVOLUNTARY  admission to inpatient psychiatric unit for safety, stabilization and treatment             -- Daily contact with patient to assess and evaluate symptoms and progress in treatment             -- Patient's case to be discussed in multi-disciplinary team meeting             -- Observation Level : q15 minute checks             -- Vital signs:  q12 hours             -- Precautions:  suicide, elopement, and assault   2. Psychiatric Diagnoses and Treatment:  Continue home Depakote DR 1000 mg nightly for mood stabilization  Increase Seroquel 200 mg nightly, for psychosis with added benefit of sleep  Continue prazosin 1 mg nightly for nightmares  Continue invega 234 mg qmonthly, next dose due 9/7  -- The risks/benefits/side-effects/alternatives to this medication were discussed in detail with the patient and time was given for questions. The patient consents to medication trial.              -- Metabolic profile and EKG monitoring obtained while on an atypical antipsychotic  BMI: 34.22 kg/m^2 TSH:  3.190 on 09/06/2022 Lipid Panel: Tgs 163, LDL 107 on 06/06/2022 HbgA1c: 5.7% on 06/06/2022 QTc: 457 on 09/03/2022             -- Encouraged patient to participate in unit milieu and in scheduled group therapies              -- Short Term Goals: Ability to identify changes in lifestyle to reduce recurrence of condition will improve and Ability to verbalize feelings will improve             -- Long Term Goals: Improvement in symptoms so as ready for discharge Other PRNS: Maalox/Mylanta, Atarax, milk of magnesia, Nicorette, trazodone, agitation (Benadryl, Ativan, Zyprexa)              3. Medical Issues Being Addressed:  #Tobacco Use Disorder  Nicorette gum Smoking cessation encouraged   4. Discharge Planning:              -- Social work and case management to assist with discharge planning and identification of hospital follow-up needs prior to discharge             -- Estimated LOS: 5-7 days             -- Discharge Concerns: Need to establish a safety plan; Medication compliance and effectiveness             -- Discharge Goals: Return home with outpatient referrals for mental health follow-up including medication management/psychotherapy    I certify that inpatient services furnished can reasonably be expected to improve the patient's condition.   Patient ID: Kenneth Jensen, male   DOB: March 11, 1990, 32 y.o.   MRN: [id number]

## 2022-09-08 NOTE — Progress Notes (Signed)
   09/08/22 0545  15 Minute Checks  Location Bedroom  Visual Appearance Calm  Behavior Sleeping  Sleep (Behavioral Health Patients Only)  Calculate sleep? (Click Yes once per 24 hr at 0600 safety check) Yes  Documented sleep last 24 hours 6.75

## 2022-09-08 NOTE — Group Note (Signed)
Date:  09/08/2022 Time:  10:05 AM  Group Topic/Focus:  Goals Group:   The focus of this group is to help patients establish daily goals to achieve during treatment and discuss how the patient can incorporate goal setting into their daily lives to aide in recovery.    Participation Level:  Did Not Attend   Kenneth Jensen [name] 09/08/2022, 10:05 AM

## 2022-09-09 DIAGNOSIS — F1420 Cocaine dependence, uncomplicated: Secondary | ICD-10-CM | POA: Diagnosis not present

## 2022-09-09 DIAGNOSIS — Z720 Tobacco use: Secondary | ICD-10-CM

## 2022-09-09 DIAGNOSIS — F200 Paranoid schizophrenia: Secondary | ICD-10-CM | POA: Diagnosis not present

## 2022-09-09 DIAGNOSIS — F1290 Cannabis use, unspecified, uncomplicated: Secondary | ICD-10-CM | POA: Diagnosis not present

## 2022-09-09 LAB — VALPROIC ACID LEVEL: Valproic Acid Lvl: 89 ug/mL (ref 50.0–100.0)

## 2022-09-09 MED ORDER — TRAZODONE HCL 50 MG PO TABS: 50.0000 mg | ORAL_TABLET | Freq: Every evening | ORAL | 0 refills | Status: AC | PRN

## 2022-09-09 MED ORDER — QUETIAPINE FUMARATE 200 MG PO TABS: 200.0000 mg | ORAL_TABLET | Freq: Every day | ORAL | 0 refills | Status: DC

## 2022-09-09 MED ORDER — LIDOCAINE 5 % EX PTCH: 1.0000 | MEDICATED_PATCH | Freq: Every day | CUTANEOUS | Status: DC | PRN

## 2022-09-09 MED ORDER — NICOTINE POLACRILEX 2 MG MT GUM: 2.0000 mg | CHEWING_GUM | OROMUCOSAL | 0 refills | Status: AC | PRN

## 2022-09-09 MED ORDER — PRAZOSIN HCL 1 MG PO CAPS: 1.0000 mg | ORAL_CAPSULE | Freq: Every day | ORAL | 0 refills | Status: AC

## 2022-09-09 MED ORDER — HYDROXYZINE HCL 50 MG PO TABS: 50.0000 mg | ORAL_TABLET | Freq: Three times a day (TID) | ORAL | 0 refills | Status: DC | PRN

## 2022-09-09 MED ORDER — DIVALPROEX SODIUM ER 500 MG PO TB24: 1000.0000 mg | ORAL_TABLET | Freq: Every day | ORAL | 0 refills | Status: AC

## 2022-09-09 MED ORDER — TRAZODONE HCL 50 MG PO TABS
50.0000 mg | ORAL_TABLET | Freq: Once | ORAL | Status: AC
  Administered 2022-09-09: 50 mg via ORAL
  Filled 2022-09-09 (×2): qty 1

## 2022-09-09 NOTE — Progress Notes (Signed)
Recreation Therapy Notes  INPATIENT RECREATION TR PLAN  Patient Details Name: Kenneth Jensen MRN: [id number] DOB: 1990-12-31 Today's Date: 09/09/2022  Rec Therapy Plan Is patient appropriate for Therapeutic Recreation?: Yes Treatment times per week: about 3 days Estimated Length of Stay: 5-7 days TR Treatment/Interventions: Group participation (Comment)  Discharge Criteria Pt will be discharged from therapy if:: Discharged Treatment plan/goals/alternatives discussed and agreed upon by:: Patient/family  Discharge Summary Short term goals set: See patient care plan Short term goals met: Not met Progress toward goals comments: Groups attended Which groups?: Other (Comment) (Problem Solving) Reason goals not met: Pt attended one group session. Therapeutic equipment acquired: N/A Reason patient discharged from therapy: Discharge from hospital Pt/family agrees with progress & goals achieved: Yes Date patient discharged from therapy: 09/09/22   Ephrata Verville-McCall, LRT,CTRS Mecca Barga A Nanami Whitelaw-McCall 09/09/2022, 1:50 PM

## 2022-09-09 NOTE — Discharge Summary (Signed)
Physician Discharge Summary Note  Patient:  Kenneth Jensen is an 32 y.o., male MRN:  [id number] DOB:  1990/03/05 Patient phone:  [phone] (home)  Patient address:   [address] [facility] [address],  Total Time spent with patient: 30 minutes  Date of Admission:  09/03/2022 Date of Discharge:   09/09/2022  Reason for Admission:  Kenneth Jensen is a 32 y.o. male  with a past psychiatric history of schizoaffective disorder, bipolar type polysubstance use disorder (tobacco, canabis, cocaine), multiple ED visits, and psychiatric hospitalizations (last 07/09/2021). He is established with [facility] ACT team. Patient initially arrived to [facility] voluntarily on 09/03/2022 for worsening psychosis, and admitted to [facility] under IVC on 09/03/2022 for substance related issues and stabilization of acute on chronic psychiatric conditions. PMHx is significant for prediabetes. UDS positive for cocaine and THC.   Principal Problem: Schizophrenia, paranoid [facility]) Discharge Diagnoses: Principal Problem:   Schizophrenia, paranoid (HCC) Active Problems:   Tobacco use disorder   Cannabis use disorder   Cocaine use disorder, severe, dependence (HCC)  Past Psychiatric History: Patient receives psychiatric services through [facility] ACT team in [place]. Previous Psychiatric Diagnoses:  Per ACTT - Schizophrenia, paranoid ; no hx of bipolar d/o; cannabis use disorder, moderate; cocaine use disorder, mild Current psychiatric medications: "Per [facility] ACT team lead, Darius, patient is prescribed Depakote ER 500 mg TID as well as Gean Birchwood IM monthly and Seroquel 200 mg at bedtime. Team lead reports that patient is noncompliant with medications at times. " LAI: Invegga Sustenna 234 mg monthly, last injection 08/13/2022  Depakote ER 1,000 mg at bedtime quetiapine 200 mg daily  Psychiatric medication history/compliance: ACTT confirms is historically noncompliant. Psychiatric Hospitalization  hx: Hospitalized at [facility] on 7//2023-07/16/2021 Hospitalized at [facility] on 05/07/2018 23-5//2023 Hospitalized at [facility] on 04/22/2021-04/25/2021 Psychotherapy hx: None identified on chart review Neuromodulation history: None identified on chart review History of suicide (obtained from HPI): In previous psychiatric hospitalizations, has made subjective reports of SI.  No history of times identified on chart review History of homicide or aggression (obtained in HPI): Has had episodes of aggression towards family, documented back in 2021   Substance Abuse Hx: Alcohol: Denies Tobacco: 0.5 PPD since age 23 Cannabis: Smokes 1 blunt daily, since age of 89 Other Illicit drugs: Reports daily cocaine use, via snorting, amount not specified.  Denies IV drug use Rx drug abuse: Denies Rehab hx: Patient denies  Past Medical History:  Past Medical History:  Diagnosis Date   Asthma    Bipolar 1 disorder (HCC)    Depression    Schizophrenia (HCC)    Schizotaxia    History reviewed. No pertinent surgical history.  Family History: History reviewed. No pertinent family history.  Family Psychiatric  History: See HPI   Social History:  Social History   Substance and Sexual Activity  Alcohol Use Yes   Comment: social     Social History   Substance and Sexual Activity  Drug Use Yes   Types: Marijuana, Cocaine   Comment: last marijuana use 09/02/22    Social History   Socioeconomic History   Marital status: Single    Spouse name: Not on file   Number of children: Not on file   Years of education: Not on file   Highest education level: Not on file  Occupational History   Not on file  Tobacco Use   Smoking status: Every Day    Current packs/day: 0.50    Types: Cigarettes   Smokeless  tobacco: Never  Substance and Sexual Activity   Alcohol use: Yes    Comment: social   Drug use: Yes    Types: Marijuana, Cocaine    Comment: last marijuana use 09/02/22   Sexual activity: Not Currently  Other  Topics Concern   Not on file  Social History Narrative   ** Merged History Encounter **       Social Determinants of Health   Financial Resource Strain: Unknown (01/30/2018)   Received from [facility], [facility]   Overall Financial Resource Strain (CARDIA)    Difficulty of Paying Living Expenses: Patient declined  Food Insecurity: Food Insecurity Present (09/04/2022)   Hunger Vital Sign    Worried About Running Out of Food in the Last Year: Sometimes true    Ran Out of Food in the Last Year: Sometimes true  Transportation Needs: Unmet Transportation Needs (09/04/2022)   PRAPARE - Transportation    Lack of Transportation (Medical): Yes    Lack of Transportation (Non-Medical): Yes  Physical Activity: Unknown (01/30/2018)   Received from [facility], [facility]   Exercise Vital Sign    Days of Exercise per Week: Patient declined    Minutes of Exercise per Session: Patient declined  Stress: Unknown (01/30/2018)   Received from [facility], [facility]   [employer] of Occupational Health - Occupational Stress Questionnaire    Feeling of Stress : Patient declined  Social Connections: Unknown (01/30/2018)   Received from [facility], [facility]   Social Connection and Isolation Panel [NHANES]    Frequency of Communication with Friends and Family: Patient declined    Frequency of Social Gatherings with Friends and Family: Patient declined    Attends Religious Services: Patient declined    Database administrator or Organizations: Patient declined    Attends Banker Meetings: Patient declined    Marital Status: Patient declined    Hospital Course:  During the patient's hospitalization, patient had extensive initial psychiatric evaluation, and follow-up psychiatric evaluations every day.  Psychiatric diagnoses provided upon  initial assessment:    Schizophrenia, paranoid (HCC) Active Problems:   Tobacco use disorder   Cannabis use disorder   Cocaine use disorder, severe, dependence (HCC)   Patient's psychiatric medications were adjusted on admission:  Restart home Depakote DR 1000 mg nightly for mood stabilization Decrease Seroquel to 100 mg nightly to 100 mg nightly, for psychosis with added benefit of sleep Start prazosin 1 mg nightly for nightmares  During the hospitalization, other adjustments were made to the patient's psychiatric medication regimen:  Trazodone 50 mg tablet p.o. q. nightly as needed for insomnia Hydroxyzine 50 mg p.o. 3 times daily as needed for anxiety  Last Depakote level (VPA) 89 on 09/09/2022  Patient's care was discussed during the interdisciplinary team meeting every day during the hospitalization.  The patient  denies having side effects to prescribed psychiatric medication.  Gradually, patient started adjusting to milieu. The patient was evaluated each day by a clinical provider to ascertain response to treatment. Improvement was noted by the patient's report of decreasing symptoms, improved sleep and appetite, affect, medication tolerance, behavior, and participation in unit programming.  Patient was asked each day to complete a self inventory noting mood, mental status, pain, new symptoms, anxiety and concerns.    Symptoms were reported as significantly decreased or resolved completely by discharge.   On day of discharge, the  patient reports that their mood is stable. The patient denied having suicidal thoughts for more than 48 hours prior to discharge.  Patient denies having homicidal thoughts.  Patient denies having auditory hallucinations.  Patient denies any visual hallucinations or other symptoms of psychosis. The patient was motivated to continue taking medication with a goal of continued improvement in mental health.   The patient reports their target psychiatric symptoms  of psychosis responded well to the psychiatric medications, and the patient reports overall benefit other psychiatric hospitalization. Supportive psychotherapy was provided to the patient. The patient also participated in regular group therapy while hospitalized. Coping skills, problem solving as well as relaxation therapies were also part of the unit programming.  Labs were reviewed with the patient, and abnormal results were discussed with the patient.  The patient is able to verbalize their individual safety plan to this provider.  # It is recommended to the patient to continue psychiatric medications as prescribed, after discharge from the hospital.    # It is recommended to the patient to follow up with your outpatient psychiatric provider and PCP.  # It was discussed with the patient, the impact of alcohol, drugs, tobacco have been there overall psychiatric and medical wellbeing, and total abstinence from substance use was recommended the patient.ed.  # Prescriptions provided or sent directly to preferred pharmacy at discharge. Patient agreeable to plan. Given opportunity to ask questions. Appears to feel comfortable with discharge.    # In the event of worsening symptoms, the patient is instructed to call the crisis hotline, 911 and or go to the nearest ED for appropriate evaluation and treatment of symptoms. To follow-up with primary care provider for other medical issues, concerns and or health care needs  # Patient was discharged to home with a plan to follow up as noted below.   Patient's is established with [facility] ACT team, and he is to continue care with them.  Sonja is sent home with 3 days of sample medications as we  are not sure when act team will see him next.  Next LAI: Invegga Sustenna 234 mg monthly, due on 09/13/2022.  Physical Findings: AIMS:  , ,  ,  ,    CIWA:    COWS:     Musculoskeletal: Strength & Muscle Tone: within normal limits Gait & Station:  normal Patient leans: N/A  Psychiatric Specialty Exam:  Presentation  General Appearance:  Appropriate for Environment; Casual; Fairly Groomed  Eye Contact: Good  Speech: Normal Rate; Clear and Coherent  Speech Volume: Normal  Handedness: Right  Mood and Affect  Mood: Euthymic; Anxious  Affect: Congruent; Full Range  Thought Process  Thought Processes: Linear  Descriptions of Associations:Intact  Orientation:Full (Time, Place and Person)  Thought Content:Logical  History of Schizophrenia/Schizoaffective disorder:No  Duration of Psychotic Symptoms:Greater than six months  Hallucinations:Hallucinations: None Description of Auditory Hallucinations: Patient reports being on unit restriction makes him want to have hallucinations  Ideas of Reference:None  Suicidal Thoughts:Suicidal Thoughts: No  Homicidal Thoughts:Homicidal Thoughts: No  Sensorium  Memory: Immediate Good; Recent Good; Remote Good  Judgment: Fair  Insight: Fair  Art therapist  Concentration: Fair  Attention Span: Fair  Recall: Good  Fund of Knowledge: Good  Language: Good  Psychomotor Activity  Psychomotor Activity: Psychomotor Activity: Normal  Assets  Assets: Communication Skills; Desire for Improvement; Housing; Physical Health; Resilience  Sleep  Sleep: Sleep: Fair Number of Hours of Sleep: 6.75  Physical Exam: Physical Exam Vitals and nursing note reviewed.  HENT:     Head: Normocephalic.     Nose: Nose normal.     Mouth/Throat:     Mouth: Mucous membranes are moist.  Eyes:     Extraocular Movements: Extraocular movements intact.  Cardiovascular:     Rate and Rhythm: Normal rate.     Pulses: Normal pulses.  Pulmonary:     Effort: Pulmonary effort is normal.  Abdominal:     Comments: Deferred  Genitourinary:    Comments: Deferred Musculoskeletal:        General: Normal range of motion.     Cervical back: Normal range of motion.  Skin:     General: Skin is warm.  Neurological:     General: No focal deficit present.     Mental Status: He is alert and oriented to person, place, and time.  Psychiatric:        Mood and Affect: Mood normal.        Behavior: Behavior normal.        Thought Content: Thought content normal.    Review of Systems  Constitutional:  Negative for chills and fever.  HENT:  Negative for sore throat.   Eyes:  Negative for blurred vision.  Respiratory:  Negative for cough, shortness of breath and wheezing.   Cardiovascular:  Negative for chest pain and palpitations.  Gastrointestinal:  Negative for abdominal pain, diarrhea, heartburn, nausea and vomiting.  Genitourinary: Negative.   Musculoskeletal: Negative.   Skin:  Negative for itching and rash.  Neurological:  Negative for dizziness, tingling, tremors, sensory change and headaches.  Endo/Heme/Allergies:        See allergy listing  Psychiatric/Behavioral:  Negative for depression (Stable with medication). The patient is not nervous/anxious and does not have insomnia.    Blood pressure (!) 112/56, pulse 79, temperature 97.8 F (36.6 C), temperature source Oral, resp. rate 18, height 5\' 6"  (1.676 m), weight 96.2 kg, SpO2 100%. Body mass index is 34.22 kg/m.   Social History   Tobacco Use  Smoking Status Every Day   Current packs/day: 0.50   Types: Cigarettes  Smokeless Tobacco Never   Tobacco Cessation:  A prescription for an FDA-approved tobacco cessation medication provided at discharge   Blood Alcohol level:  Lab Results  Component Value Date   [facility] <10 09/03/2022   ETH <10 07/02/2022    Metabolic Disorder Labs:  Lab Results  Component Value Date   HGBA1C 5.7 (H) 06/05/2022   MPG 116.89 04/23/2021   MPG 116.89 05/15/2020   No results found for: "PROLACTIN" Lab Results  Component Value Date   CHOL 177 06/05/2022   TRIG 163 (H) 06/05/2022   HDL 41 06/05/2022   CHOLHDL 4.3 06/05/2022   VLDL 25 04/23/2021   LDLCALC 107  (H) 06/05/2022   LDLCALC 77 04/23/2021    See Psychiatric Specialty Exam and Suicide Risk Assessment completed by Attending Physician prior to discharge.  Discharge destination:  Home  Is patient on multiple antipsychotic therapies at discharge:  Yes  Has Patient had three or more failed trials of antipsychotic monotherapy by history:  No  Recommended Plan for Multiple Antipsychotic Therapies: NA  Discharge Instructions     Diet - low sodium heart healthy   Complete by: As directed    Increase activity slowly   Complete by: As directed       Allergies as of 09/09/2022       Reactions   Penicillins Anaphylaxis   Amoxicillin Swelling   Haldol [haloperidol]  Pt states "I went crazy"   Percocet [oxycodone-acetaminophen]    Vicodin [hydrocodone-acetaminophen] Itching   Vicodin [hydrocodone-acetaminophen]         Medication List     TAKE these medications      Indication  divalproex 500 MG 24 hr tablet Commonly known as: DEPAKOTE ER Take 2 tablets (1,000 mg total) by mouth at bedtime for 14 days. What changed:  how much to take when to take this  Indication: Manic Phase of Manic-Depression, Schizoaffective   hydrOXYzine 50 MG tablet Commonly known as: ATARAX Take 1 tablet (50 mg total) by mouth 3 (three) times daily as needed for anxiety.  Indication: Feeling Anxious   lidocaine 5 % Commonly known as: LIDODERM Place 1 patch onto the skin daily as needed. Remove & Discard patch within 12 hours or as directed by MD  Indication: back pain   nicotine polacrilex 2 MG gum Commonly known as: NICORETTE Take 1 each (2 mg total) by mouth as needed for smoking cessation.  Indication: Nicotine Addiction   paliperidone 234 MG/1.5ML injection Commonly known as: INVEGA SUSTENNA Inject 234 mg into the muscle every 28 (twenty-eight) days.  Indication: Schizophrenia   prazosin 1 MG capsule Commonly known as: MINIPRESS Take 1 capsule (1 mg total) by mouth at bedtime.   Indication: Frightening Dreams   QUEtiapine 200 MG tablet Commonly known as: SEROQUEL Take 1 tablet (200 mg total) by mouth at bedtime for 14 days.  Indication: Schizophrenia   traZODone 50 MG tablet Commonly known as: DESYREL Take 1 tablet (50 mg total) by mouth at bedtime as needed for up to 14 days for sleep.  Indication: Trouble Sleeping        Follow-up Information     [facility]. Go on 09/19/2022.   Why: You have a hospital follow up appointment on 09/19/22 at 11:00 am. The appointment will be held in person. Following this initial appointment, you will be scheduled for a clinical assessment, to obtain therapy and medication management services. Contact information: [address] [place] [address] [phone]                Follow-up recommendations:   Discharge Recommendations:  The patient is being discharged to home. Patient is to take his discharge medications as ordered.  See follow up above. We recommend that he participates in individual therapy to target uncontrollable agitation and substance abuse.  We recommend that he participates in therapy to target personal conflict, to improve communication skills and conflict resolution skills. Patient is to initiate/implement a contingency based behavioral model to address his behavior. We recommend that he gets AIMS scale, height, weight, blood pressure, fasting lipid panel, fasting blood sugar in three months from discharge if he's on atypical antipsychotics.  Patient will benefit from monitoring of recurrent suicidal ideation since patient is on antidepressant medication. The patient should abstain from all illicit substances and alcohol. If the patient's symptoms worsen or do not continue to improve or if the patient becomes actively suicidal or homicidal then it is recommended that the patient return to the closest hospital emergency room or call 911 for further evaluation and treatment.  National Suicide Prevention Lifeline 1800-SUICIDE or 310-422-1508. Please follow up with your primary medical doctor for all other medical needs.  The patient has been educated on the possible side effects to medications and she/her guardian is to contact a medical professional and inform outpatient provider of any new side effects of medication. He is to take  regular diet and activity as tolerated.  Will benefit from moderate daily exercise. Patient was educated about removing/locking any firearms, medications or dangerous products from the home.  Activity:  As tolerated Diet:  Regular Diet     Signed: Cecilie Lowers, FNP 09/09/2022, 10:30 AM

## 2022-09-09 NOTE — Discharge Instructions (Signed)
-  Follow-up with your outpatient psychiatric provider -instructions on appointment date, time, and address (location) are provided to you in discharge paperwork.  -Take your psychiatric medications as prescribed at discharge - instructions are provided to you in the discharge paperwork You will get 3 days of sample medication  You will get printed prescriptions for 30 days of medications, that you can give to your ACT team to have filled  -Follow-up with outpatient primary care doctor and other specialists -for management of preventative medicine and any chronic medical disease.  -Recommend abstinence from alcohol, tobacco, and other illicit drug use at discharge.   -If your psychiatric symptoms recur, worsen, or if you have side effects to your psychiatric medications, call your outpatient psychiatric provider, 911, [phone] or go to the nearest emergency department.  -If suicidal thoughts occur, call your outpatient psychiatric provider, 911, [phone] or go to the nearest emergency department.  Naloxone (Narcan) can help reverse an overdose when given to the victim quickly.  [facility] offers free naloxone kits and instructions/training on its use.  Add naloxone to your first aid kit and you can help save a life.   Pick up your free kit at the following locations:   [place]:  [facility], [address] [place] [address] [phone]) [facility] [address] [place] [address] [phone]) [facility] [address] [place] [address]  [place]: [facility] [address] [place] [address] ([phone]) [facility] [address] [place] [address] [phone])

## 2022-09-09 NOTE — Care Management Important Message (Signed)
Medicare IM given to social work to give to the patient 

## 2022-09-09 NOTE — Plan of Care (Signed)

## 2022-09-09 NOTE — Progress Notes (Signed)
Pt verbalized troubling sleeping. PRN medication for insomnia was given earlier but patient states it was ineffective.    Provider paged and made aware. One time order of trazodone ordered. Medication administered to patient.  After 1 hour reassessment, medication shown to be effective and patient asleep.

## 2022-09-09 NOTE — [facility] Suicide Risk Assessment (Signed)
Suicide Risk Assessment  Discharge Assessment    [facility] Discharge Suicide Risk Assessment   Principal Problem: Schizophrenia, paranoid (HCC) Discharge Diagnoses: Principal Problem:   Schizophrenia, paranoid (HCC) Active Problems:   Tobacco use disorder   Cannabis use disorder   Cocaine use disorder, severe, dependence (HCC)  Reason for admission:  Kenneth Jensen is a 32 y.o. male  with a past psychiatric history of schizoaffective disorder, bipolar type polysubstance use disorder (tobacco, canabis, cocaine), multiple ED visits, and psychiatric hospitalizations (last 07/09/2021). He is established with [facility] ACT team. Patient initially arrived to [facility] voluntarily on 09/03/2022 for worsening psychosis, and admitted to [facility] under IVC on 09/03/2022 for substance related issues and stabilization of acute on chronic psychiatric conditions. PMHx is significant for prediabetes. UDS positive for cocaine and THC.    Total Time spent with patient: 30 minutes  Musculoskeletal: Strength & Muscle Tone: within normal limits Gait & Station: normal Patient leans: N/A  Psychiatric Specialty Exam  Presentation  General Appearance:  Appropriate for Environment; Casual; Fairly Groomed  Eye Contact: Good  Speech: Normal Rate; Clear and Coherent  Speech Volume: Normal  Handedness: Right  Mood and Affect  Mood: Euthymic; Anxious  Duration of Depression Symptoms: Greater than two weeks  Affect: Congruent; Full Range  Thought Process  Thought Processes: Linear  Descriptions of Associations:Intact  Orientation:Full (Time, Place and Person)  Thought Content:Logical  History of Schizophrenia/Schizoaffective disorder:No  Duration of Psychotic Symptoms:Greater than six months  Hallucinations:Hallucinations: None Description of Auditory Hallucinations: Patient reports being on unit restriction makes him want to have hallucinations  Ideas of Reference:None  Suicidal  Thoughts:Suicidal Thoughts: No  Homicidal Thoughts:Homicidal Thoughts: No  Sensorium  Memory: Immediate Good; Recent Good; Remote Good  Judgment: Fair  Insight: Fair  Art therapist  Concentration: Fair  Attention Span: Fair  Recall: Good  Fund of Knowledge: Good  Language: Good  Psychomotor Activity  Psychomotor Activity: Psychomotor Activity: Normal  Assets  Assets: Communication Skills; Desire for Improvement; Housing; Physical Health; Resilience  Sleep  Sleep: Sleep: Fair Number of Hours of Sleep: 6.75  Physical Exam: Physical Exam Vitals and nursing note reviewed.  HENT:     Head: Normocephalic.     Nose: Nose normal.     Mouth/Throat:     Mouth: Mucous membranes are moist.  Eyes:     Extraocular Movements: Extraocular movements intact.  Cardiovascular:     Rate and Rhythm: Normal rate.     Pulses: Normal pulses.  Pulmonary:     Effort: Pulmonary effort is normal.  Abdominal:     Comments: Deferred  Genitourinary:    Comments: Deferred Musculoskeletal:        General: Normal range of motion.     Cervical back: Normal range of motion.  Skin:    General: Skin is warm.  Neurological:     General: No focal deficit present.     Mental Status: He is alert and oriented to person, place, and time.  Psychiatric:        Mood and Affect: Mood normal.        Behavior: Behavior normal.        Thought Content: Thought content normal.    Review of Systems  Constitutional:  Negative for chills and fever.  HENT:  Negative for sore throat.   Eyes:  Negative for blurred vision.  Respiratory:  Negative for cough, shortness of breath and wheezing.   Cardiovascular:  Negative for chest pain and palpitations.  Gastrointestinal:  Negative  for abdominal pain, heartburn, nausea and vomiting.  Genitourinary: Negative.   Musculoskeletal: Negative.   Skin:  Negative for itching and rash.  Neurological:  Negative for dizziness, tingling, tremors,  sensory change, loss of consciousness and headaches.  Endo/Heme/Allergies:        See allergy listing  Psychiatric/Behavioral: Negative.     Blood pressure (!) 112/56, pulse 79, temperature 97.8 F (36.6 C), temperature source Oral, resp. rate 18, height 5\' 6"  (1.676 m), weight 96.2 kg, SpO2 100%. Body mass index is 34.22 kg/m.  Mental Status Per Nursing Assessment::   On Admission:  NA  Demographic Factors:  Male, Adolescent or young adult, Low socioeconomic status, and Unemployed  Loss Factors: Financial problems/change in socioeconomic status  Historical Factors: Prior suicide attempts, Family history of mental illness or substance abuse, and Impulsivity  Risk Reduction Factors:   Positive social support, Positive therapeutic relationship, and Positive coping skills or problem solving skills  Continued Clinical Symptoms:  Alcohol/Substance Abuse/Dependencies Schizophrenia:   Less than 35 years old More than one psychiatric diagnosis Previous Psychiatric Diagnoses and Treatments Medical Diagnoses and Treatments/Surgeries  Cognitive Features That Contribute To Risk:  Polarized thinking    Suicide Risk:  Mild:  There are no identifiable plans, no associated intent, mild dysphoria and related symptoms, good self-control (both objective and subjective assessment), few other risk factors, and identifiable protective factors, including available and accessible social support.   Follow-up Information     [facility]. Go on 09/19/2022.   Why: You have a hospital follow up appointment on 09/19/22 at 11:00 am. The appointment will be held in person. Following this initial appointment, you will be scheduled for a clinical assessment, to obtain therapy and medication management services. Contact information: [address] [place] [address] [phone]                 Plan Of Care/Follow-up recommendations:  Discharge Recommendations:  The  patient is being discharged to home. Patient is to take his discharge medications as ordered.  See follow up above. We recommend that he participates in individual therapy to target uncontrollable agitation and substance abuse.  We recommend that he participates in therapy to target personal conflict, to improve communication skills and conflict resolution skills. Patient is to initiate/implement a contingency based behavioral model to address his behavior. We recommend that he gets AIMS scale, height, weight, blood pressure, fasting lipid panel, fasting blood sugar in three months from discharge if he's on atypical antipsychotics.  Patient will benefit from monitoring of recurrent suicidal ideation since patient is on antidepressant medication. The patient should abstain from all illicit substances and alcohol. If the patient's symptoms worsen or do not continue to improve or if the patient becomes actively suicidal or homicidal then it is recommended that the patient return to the closest hospital emergency room or call 911 for further evaluation and treatment. National Suicide Prevention Lifeline 1800-SUICIDE or (361)606-3817. Please follow up with your primary medical doctor for all other medical needs.  The patient has been educated on the possible side effects to medications and she/her guardian is to contact a medical professional and inform outpatient provider of any new side effects of medication. He is to take regular diet and activity as tolerated.  Will benefit from moderate daily exercise. Patient was educated about removing/locking any firearms, medications or dangerous products from the home.  Activity:  As tolerated Diet:  Regular Diet  Cecilie Lowers, FNP 09/09/2022, 10:09 AM

## 2022-09-09 NOTE — Plan of Care (Signed)
Patient attended one group session in which he was able to show good interaction with peers and staff.   Reilley Valentine-McCall, LRT,CTRS

## 2022-09-09 NOTE — Progress Notes (Signed)
Patient discharged to home via cab. Discharge instructions, prescriptions and information about follow-up appointment given to patient with verbalization of understanding. All personal belongings returned to pt at time of discharge . Plan of Care resolved. Pt denied SI/HI and AVH. Pt escorted to lobby by RN at 1105.  09/09/22 0800  Psych Admission Type (Psych Patients Only)  Admission Status Involuntary  Psychosocial Assessment  Patient Complaints Depression  Eye Contact Fair  Facial Expression Animated  Affect Depressed;Anxious  Speech Logical/coherent  Interaction Assertive  Motor Activity Other (Comment) (WNL)  Appearance/Hygiene In scrubs  Behavior Characteristics Appropriate to situation  Mood Depressed;Anxious  Thought Process  Coherency WDL  Content WDL  Delusions None reported or observed  Perception WDL  Hallucination None reported or observed  Judgment Limited  Confusion None  Danger to Self  Current suicidal ideation? Denies  Danger to Others  Danger to Others None reported or observed

## 2022-11-01 ENCOUNTER — Encounter: Payer: Self-pay | Admitting: Emergency Medicine

## 2022-11-01 ENCOUNTER — Emergency Department
Admission: EM | Admit: 2022-11-01 | Discharge: 2022-11-02 | Disposition: A | Discharge status: Other Acute Inpatient Hospital | Payer: Medicare HMO | Attending: Emergency Medicine | Admitting: Emergency Medicine

## 2022-11-01 ENCOUNTER — Other Ambulatory Visit: Payer: Self-pay

## 2022-11-01 DIAGNOSIS — J45909 Unspecified asthma, uncomplicated: Secondary | ICD-10-CM | POA: Insufficient documentation

## 2022-11-01 DIAGNOSIS — F1420 Cocaine dependence, uncomplicated: Secondary | ICD-10-CM | POA: Insufficient documentation

## 2022-11-01 DIAGNOSIS — F17210 Nicotine dependence, cigarettes, uncomplicated: Secondary | ICD-10-CM | POA: Diagnosis not present

## 2022-11-01 DIAGNOSIS — F1410 Cocaine abuse, uncomplicated: Secondary | ICD-10-CM | POA: Diagnosis present

## 2022-11-01 DIAGNOSIS — F200 Paranoid schizophrenia: Secondary | ICD-10-CM | POA: Diagnosis present

## 2022-11-01 DIAGNOSIS — R440 Auditory hallucinations: Secondary | ICD-10-CM | POA: Diagnosis present

## 2022-11-01 LAB — COMPREHENSIVE METABOLIC PANEL
ALT: 15 U/L (ref 0–44)
AST: 23 U/L (ref 15–41)
Albumin: 4.4 g/dL (ref 3.5–5.0)
Alkaline Phosphatase: 63 U/L (ref 38–126)
Anion gap: 8 (ref 5–15)
BUN: 8 mg/dL (ref 6–20)
CO2: 25 mmol/L (ref 22–32)
Calcium: 8.6 mg/dL — ABNORMAL LOW (ref 8.9–10.3)
Chloride: 101 mmol/L (ref 98–111)
Creatinine, Ser: 0.88 mg/dL (ref 0.61–1.24)
GFR, Estimated: >60 mL/min (ref >60)
Glucose, Bld: 107 mg/dL — ABNORMAL HIGH (ref 70–99)
Potassium: 3.6 mmol/L (ref 3.5–5.1)
Sodium: 134 mmol/L — ABNORMAL LOW (ref 135–145)
Total Bilirubin: 0.6 mg/dL (ref 0.3–1.2)
Total Protein: 8 g/dL (ref 6.5–8.1)

## 2022-11-01 LAB — SALICYLATE LEVEL: Salicylate Lvl: <7 mg/dL — ABNORMAL LOW (ref 7.0–30.0)

## 2022-11-01 LAB — URINE DRUG SCREEN, QUALITATIVE (ARMC ONLY)
Amphetamines, Ur Screen: NOT DETECTED
Barbiturates, Ur Screen: NOT DETECTED
Benzodiazepine, Ur Scrn: NOT DETECTED
Cannabinoid 50 Ng, Ur ~~LOC~~: POSITIVE — AB
Cocaine Metabolite,Ur ~~LOC~~: NOT DETECTED
MDMA (Ecstasy)Ur Screen: NOT DETECTED
Methadone Scn, Ur: NOT DETECTED
Opiate, Ur Screen: NOT DETECTED
Phencyclidine (PCP) Ur S: NOT DETECTED
Tricyclic, Ur Screen: NOT DETECTED

## 2022-11-01 LAB — CBC
HCT: 40.4 % (ref 39.0–52.0)
Hemoglobin: 13.7 g/dL (ref 13.0–17.0)
MCH: 28.9 pg (ref 26.0–34.0)
MCHC: 33.9 g/dL (ref 30.0–36.0)
MCV: 85.2 fL (ref 80.0–100.0)
Platelets: 339 10*3/uL (ref 150–400)
RBC: 4.74 MIL/uL (ref 4.22–5.81)
RDW: 13.1 % (ref 11.5–15.5)
WBC: 8.3 10*3/uL (ref 4.0–10.5)
nRBC: 0 % (ref 0.0–0.2)

## 2022-11-01 LAB — ACETAMINOPHEN LEVEL: Acetaminophen (Tylenol), Serum: <10 ug/mL — ABNORMAL LOW (ref 10–30)

## 2022-11-01 LAB — ETHANOL: Alcohol, Ethyl (B): <10 mg/dL (ref <10)

## 2022-11-01 MED ORDER — TRAZODONE HCL 50 MG PO TABS
50.0000 mg | ORAL_TABLET | Freq: Every evening | ORAL | Status: DC | PRN
  Administered 2022-11-01: 50 mg via ORAL
  Filled 2022-11-01: qty 1

## 2022-11-01 MED ORDER — PRAZOSIN HCL 1 MG PO CAPS
1.0000 mg | ORAL_CAPSULE | Freq: Every day | ORAL | Status: DC
  Administered 2022-11-01: 1 mg via ORAL
  Filled 2022-11-01: qty 1

## 2022-11-01 MED ORDER — DIVALPROEX SODIUM ER 250 MG PO TB24
1000.0000 mg | ORAL_TABLET | Freq: Every day | ORAL | Status: DC
  Administered 2022-11-01: 1000 mg via ORAL
  Filled 2022-11-01: qty 4

## 2022-11-01 MED ORDER — HYDROXYZINE HCL 25 MG PO TABS
50.0000 mg | ORAL_TABLET | Freq: Three times a day (TID) | ORAL | Status: DC | PRN
  Administered 2022-11-01: 50 mg via ORAL
  Filled 2022-11-01: qty 2

## 2022-11-01 MED ORDER — NICOTINE 21 MG/24HR TD PT24
21.0000 mg | MEDICATED_PATCH | Freq: Every day | TRANSDERMAL | Status: DC
  Administered 2022-11-01 – 2022-11-02 (×2): 21 mg via TRANSDERMAL
  Filled 2022-11-01 (×2): qty 1

## 2022-11-01 MED ORDER — QUETIAPINE FUMARATE 200 MG PO TABS
200.0000 mg | ORAL_TABLET | Freq: Every day | ORAL | Status: DC
  Administered 2022-11-01: 200 mg via ORAL
  Filled 2022-11-01: qty 1

## 2022-11-01 NOTE — Consult Note (Signed)
Telepsych Consultation   Reason for Consult:  Psyc Evauation Referring Physician:  Dr. Erma Heritage Location of Patient: [facility] ER Location of Provider: Behavioral Health TTS Department  Patient Identification: Kenneth Jensen MRN:  [id number] Principal Diagnosis: Schizophrenia, paranoid (HCC) Diagnosis:  Principal Problem:   Schizophrenia, paranoid (HCC) Active Problems:   Auditory hallucinations   Cocaine abuse (HCC)   Total Time spent with patient: 30 minutes  Subjective:  "I wanna go back to [place]"   HPI:  Tele psych Assessment  Kenneth Jensen, 32 y.o., male patient seen via tele health by TTS and this provider; chart reviewed and consulted with Dr. Erma Heritage on 11/01/22.  On evaluation Kenneth Jensen reports that he is upset because he says his family is stealing his "stuff".  He's sick of it and wants to go back to [place].  Patient continues to ramble about things that don't make sense. He states that he is continuously hearing voices.  He says he "did cocaine about 2 to 3 weeks ago".  He says he's medication compliant." He says he still has an actt team.    Per chart review, patients most recent hospitalization is 09/03/2022. His LOS was 6 days.    During evaluation Kenneth Jensen is sitting on the bed, he appears disheveled and unkept; he  is alert/oriented x 4; anxious/fidgety/agitated but cooperative; and mood congruent with affect.  Patient is speaking in a clear tone at increased volume, and rapid pace; with fair eye contact.  His thought process is incoherent and irrelevant;  He is  rambling on about his family and his frustrations.  At times he stops mid sentence and appears to be responding to internal stimuli.   Patient does not outright endorse SI but refers to himself as a dead man walking. He denies homicidal ideation stating he does not want to go back to jail. Patient is psychotic  and paranoid during this assessment.     Recommendations: Psychiatric  hospitalization  Dr. Erma Heritage informed of above recommendation and disposition  Past Psychiatric History: Schizophrenia, paranoid (HCC)  Risk to Self:   Risk to Others:   Prior Inpatient Therapy:   Prior Outpatient Therapy:    Past Medical History:  Past Medical History:  Diagnosis Date   Asthma    Bipolar 1 disorder (HCC)    Depression    Schizophrenia (HCC)    Schizotaxia    History reviewed. No pertinent surgical history. Family History: History reviewed. No pertinent family history. Family Psychiatric  History: unknown Social History:  Social History   Substance and Sexual Activity  Alcohol Use Yes   Comment: social     Social History   Substance and Sexual Activity  Drug Use Yes   Types: Marijuana, Cocaine   Comment: last marijuana use 09/02/22    Social History   Socioeconomic History   Marital status: Single    Spouse name: Not on file   Number of children: Not on file   Years of education: Not on file   Highest education level: Not on file  Occupational History   Not on file  Tobacco Use   Smoking status: Every Day    Current packs/day: 0.50    Types: Cigarettes   Smokeless tobacco: Never  Substance and Sexual Activity   Alcohol use: Yes    Comment: social   Drug use: Yes    Types: Marijuana, Cocaine    Comment: last marijuana use 09/02/22   Sexual activity: Not Currently  Other  Topics Concern   Not on file  Social History Narrative   ** Merged History Encounter **       Social Determinants of Health   Financial Resource Strain: Unknown (01/30/2018)   Received from [facility], [facility]   Overall Financial Resource Strain (CARDIA)    Difficulty of Paying Living Expenses: Patient declined  Food Insecurity: Food Insecurity Present (09/04/2022)   Hunger Vital Sign    Worried About Running Out of Food in the Last Year: Sometimes true    Ran Out of Food in the Last Year: Sometimes true  Transportation Needs:  Unmet Transportation Needs (09/04/2022)   PRAPARE - Administrator, Civil Service (Medical): Yes    Lack of Transportation (Non-Medical): Yes  Physical Activity: Unknown (01/30/2018)   Received from [facility], [facility]   Exercise Vital Sign    Days of Exercise per Week: Patient declined    Minutes of Exercise per Session: Patient declined  Stress: Unknown (01/30/2018)   Received from [facility], [facility]   [employer] of Occupational Health - Occupational Stress Questionnaire    Feeling of Stress : Patient declined  Social Connections: Unknown (01/30/2018)   Received from [facility], [facility]   Social Connection and Isolation Panel [NHANES]    Frequency of Communication with Friends and Family: Patient declined    Frequency of Social Gatherings with Friends and Family: Patient declined    Attends Religious Services: Patient declined    Database administrator or Organizations: Patient declined    Attends Banker Meetings: Patient declined    Marital Status: Patient declined   Additional Social History:    Allergies:   Allergies  Allergen Reactions   Penicillins Anaphylaxis   Amoxicillin Swelling   Haldol [Haloperidol]     Pt states "I went crazy"   Percocet [Oxycodone-Acetaminophen]    Vicodin [Hydrocodone-Acetaminophen] Itching   Vicodin [Hydrocodone-Acetaminophen]     Labs:  Results for orders placed or performed during the hospital encounter of 11/01/22 (from the past 48 hour(s))  Comprehensive metabolic panel     Status: Abnormal   Collection Time: 11/01/22  7:47 PM  Result Value Ref Range   Sodium 134 (L) 135 - 145 mmol/L   Potassium 3.6 3.5 - 5.1 mmol/L   Chloride 101 98 - 111 mmol/L   CO2 25 22 - 32 mmol/L   Glucose, Bld 107 (H) 70 - 99 mg/dL    Comment: Glucose reference range applies only to samples taken after  fasting for at least 8 hours.   BUN 8 6 - 20 mg/dL   Creatinine, Ser 4.09 0.61 - 1.24 mg/dL   Calcium 8.6 (L) 8.9 - 10.3 mg/dL   Total Protein 8.0 6.5 - 8.1 g/dL   Albumin 4.4 3.5 - 5.0 g/dL   AST 23 15 - 41 U/L   ALT 15 0 - 44 U/L   Alkaline Phosphatase 63 38 - 126 U/L   Total Bilirubin 0.6 0.3 - 1.2 mg/dL   GFR, Estimated >81 >19 mL/min    Comment: (NOTE) Calculated using the CKD-EPI Creatinine Equation (2021)    Anion gap 8 5 - 15    Comment: Performed at [facility], [address], [place], [place] [address]  Ethanol     Status: None   Collection Time: 11/01/22  7:47 PM  Result Value Ref Range  Alcohol, Ethyl (B) <10 <10 mg/dL    Comment: (NOTE) Lowest detectable limit for serum alcohol is 10 mg/dL.  For medical purposes only. Performed at [facility], [address] [place], [place] [address]   Salicylate level     Status: Abnormal   Collection Time: 11/01/22  7:47 PM  Result Value Ref Range   Salicylate Lvl <7.0 (L) 7.0 - 30.0 mg/dL    Comment: Performed at [facility], [address] [place], [place] [address]  Acetaminophen level     Status: Abnormal   Collection Time: 11/01/22  7:47 PM  Result Value Ref Range   Acetaminophen (Tylenol), Serum <10 (L) 10 - 30 ug/mL    Comment: (NOTE) Therapeutic concentrations vary significantly. A range of 10-30 ug/mL  may be an effective concentration for many patients. However, some  are best treated at concentrations outside of this range. Acetaminophen concentrations >150 ug/mL at 4 hours after ingestion  and >50 ug/mL at 12 hours after ingestion are often associated with  toxic reactions.  Performed at [facility], [address] [place], [place] [address]   cbc     Status: None   Collection Time: 11/01/22  7:47 PM  Result Value Ref Range   WBC 8.3 4.0 - 10.5 K/uL   RBC 4.74 4.22 - 5.81 MIL/uL   Hemoglobin 13.7 13.0 - 17.0 g/dL   HCT 42.5 95.6 - 38.7 %   MCV 85.2 80.0  - 100.0 fL   MCH 28.9 26.0 - 34.0 pg   MCHC 33.9 30.0 - 36.0 g/dL   RDW 56.4 33.2 - 95.1 %   Platelets 339 150 - 400 K/uL   nRBC 0.0 0.0 - 0.2 %    Comment: Performed at [facility], [address] [place], [place] [address]    Medications:  No current facility-administered medications for this encounter.   Current Outpatient Medications  Medication Sig Dispense Refill   divalproex (DEPAKOTE ER) 500 MG 24 hr tablet Take 2 tablets (1,000 mg total) by mouth at bedtime for 14 days. 28 tablet 0   hydrOXYzine (ATARAX) 50 MG tablet Take 1 tablet (50 mg total) by mouth 3 (three) times daily as needed for anxiety. 14 tablet 0   lidocaine (LIDODERM) 5 % Place 1 patch onto the skin daily as needed. Remove & Discard patch within 12 hours or as directed by MD     nicotine polacrilex (NICORETTE) 2 MG gum Take 1 each (2 mg total) by mouth as needed for smoking cessation. 100 tablet 0   paliperidone (INVEGA SUSTENNA) 234 MG/1.5ML injection Inject 234 mg into the muscle every 28 (twenty-eight) days. 1.8 mL 1   prazosin (MINIPRESS) 1 MG capsule Take 1 capsule (1 mg total) by mouth at bedtime. 14 capsule 0   QUEtiapine (SEROQUEL) 200 MG tablet Take 1 tablet (200 mg total) by mouth at bedtime for 14 days. 14 tablet 0   traZODone (DESYREL) 50 MG tablet Take 1 tablet (50 mg total) by mouth at bedtime as needed for up to 14 days for sleep. 14 tablet 0    Musculoskeletal: Strength & Muscle Tone: within normal limits Gait & Station: normal Patient leans: N/A  Psychiatric Specialty Exam:  Presentation  General Appearance:  Unkept: Disheveled  Eye Contact: fair  Speech: Normal Rate; Clear and Coherent  Speech Volume: blocked  Handedness: Right   Mood and Affect  Mood: Dysphoric  Affect: Congruent; Full Range   Thought Process  Thought Processes: Linear  Descriptions of Associations:loose, tangential  Orientation:Full (Time, Place and Person)  Thought  Content:Logical  History of Schizophrenia/Schizoaffective disorder:No  Duration of Psychotic Symptoms:Greater than six months  Hallucinations:Audio Ideas of Reference: paranoid, delusional Suicidal Thoughts:passive, no plan Homicidal Thoughts: none  Sensorium  Memory: Immediate poor remote poor  Judgment: impaired  Insight: poor   Art therapist  Concentration: poor  Attention Span: Fair  Recall: poor  Fund of Knowledge: poor  Language: Good   Psychomotor Activity  Psychomotor Activity:wdl  Assets  Assets: Manufacturing systems engineer; Desire for Improvement; Housing; Physical Health; Resilience   Sleep  Sleep:poor   Physical Exam: Physical Exam Vitals and nursing note reviewed.  HENT:     Head: Normocephalic and atraumatic.     Nose: Nose normal.  Eyes:     Extraocular Movements: Extraocular movements intact.     Pupils: Pupils are equal, round, and reactive to light.  Pulmonary:     Effort: Pulmonary effort is normal.  Musculoskeletal:        General: Normal range of motion.     Cervical back: Normal range of motion.  Skin:    General: Skin is dry.  Neurological:     Mental Status: He is alert and oriented to person, place, and time.  Psychiatric:        Attention and Perception: He perceives auditory hallucinations.        Mood and Affect: Mood is anxious. Affect is labile and inappropriate.        Speech: Speech is tangential.        Behavior: Behavior is hyperactive. Behavior is cooperative.        Thought Content: Thought content does not include homicidal ideation. Thought content does not include homicidal plan.        Cognition and Memory: Cognition is impaired. Memory is impaired.        Judgment: Judgment is impulsive.    Review of Systems  Psychiatric/Behavioral:  Positive for depression, hallucinations, substance abuse and suicidal ideas. The patient is nervous/anxious.   All other systems reviewed and are negative.  Blood  pressure (!) 154/86, pulse 81, temperature 98.5 F (36.9 C), resp. rate 18, height 5\' 6"  (1.676 m), weight 97 kg, SpO2 99%. Body mass index is 34.52 kg/m.  Treatment Plan Summary: Daily contact with patient to assess and evaluate symptoms and progress in treatment, Medication management, and Plan  Kenneth Jensen was admitted to [facility] under the service of Shaune Pollack, MD for Schizophrenia, paranoid [facility]), crisis management, and stabilization. Routine labs ordered, which include  Lab Orders         Comprehensive metabolic panel         Ethanol         Salicylate level         Acetaminophen level         cbc         Urine Drug Screen, Qualitative    Medication Management: Medications started  Will maintain observation checks every 15 minutes for safety. Psychosocial education regarding relapse prevention and self-care; social and communication  Social work will consult with family for collateral information and discuss discharge and follow up plan.  Disposition: Recommend psychiatric Inpatient admission when medically cleared. Supportive therapy provided about ongoing stressors. Discussed crisis plan, support from social network, calling 911, coming to the Emergency Department, and calling Suicide Hotline.  This service was provided via telemedicine using a 2-way, interactive audio and video technology.   Elray Buba  Myliyah Rebuck, NP 11/01/2022 8:31 PM

## 2022-11-01 NOTE — ED Notes (Signed)
Dr. Erma Heritage at bedside talking to pt

## 2022-11-01 NOTE — ED Notes (Signed)
Pt ambulatory to restroom, provided with an urine cup to collect sample

## 2022-11-01 NOTE — ED Notes (Signed)
Pt standing in hallway screaming" I need a new everything, I needs a new life, a new family by tomorrow" Rn tried to explained to pt that MD will be in room to check on him, and to learn the plac of care, Pt requested 2 blankets, RN provided pt with 2 blankets

## 2022-11-01 NOTE — BH Assessment (Signed)
Per [facility] Alcario Drought), patient to be referred out of system.  Referral information for Psychiatric Hospitalization faxed to;   [facility] [phone]) No appropriate beds  [facility] [phone]),   [facility] [phone]),  [place] [phone]--- [phone]--- [phone]--- [phone])  [address] [phone]),   [facility] [phone] -or- [phone]),   [facility] [phone]),  [place] [phone])  [facility] [phone])

## 2022-11-01 NOTE — ED Triage Notes (Signed)
Pt to ED via [employer], per officer, pt came voluntarily. Per officer pt initially called EMS and requested to come to hospital. Pt reports hearing voices. Pt reports he took 2 sleeping pills and now he wants to "go back to the back corner and go to sleep and be left alone". Pt denies active SI/HI at this time, states "I don't want to do what the voices are telling me".   Pt appears to be reponding to internal stimuli in triage.

## 2022-11-01 NOTE — ED Notes (Signed)
TTS at bedside. 

## 2022-11-01 NOTE — ED Notes (Signed)
Kenneth Jensen, from [facility] asking about pt's behavior, if he has been medically cleared, no further information provided by Trinna Post

## 2022-11-01 NOTE — ED Notes (Signed)
Pt changed into paper scrubs in triage and belongings were taken into account for. RN, ED tech, and security officer in room at this time with pt.

## 2022-11-01 NOTE — BH Assessment (Signed)
Comprehensive Clinical Assessment (CCA) Note  11/01/2022 Kenneth Jensen [id number] Recommendations for Services/Supports/Treatments: Psych NP [name] determined pt. meets psychiatric inpatient criteria. Kenneth Jensen is a 32 year old, English speaking, Black male with a hx of schizoaffective disorder, bipolar type, cocaine use disorder, severe, and cannabis use d/o, moderate. Pt presented to [facility] ED Vol from home with complaints of auditory hallucinations. Per triage note: Pt to ED via [employer], per officer, pt came voluntarily. Per officer pt initially called EMS and requested to come to hospital. Pt reports hearing voices. Pt reports he took 2 sleeping pills and now he wants to "go back to the back corner and go to sleep and be left alone". Pt denies active SI/HI at this time, states "I don't want to do what the voices are telling me".  Pt was pacing in the hallway upon this writer's arrival. When asked how pt was feeling the pt stated, "I'm irritated". Pt proceeded to sit on his bed and cooperated with the assessment process. Pt identified his main stressor as worsening auditory stimuli. When asked why he'd presented to the ER pt. stated, "I keep hearing voices, and I haven't used any drugs." Pt admitted to using cannabis prior to arrival. The reported that his last cocaine use was 2 weeks ago. Pt reported that he takes his medications as prescribed. Pt explained that he'd met with his ACT Team on Friday. Pt had flight of ideas and his conversation was nonlinear. Pt endorsed paranoid delusions about people breaking into his apartment and messing with various things. Pt admitted that this causes him great distress and robs him of his peace. Pt was fixated on moving to [facility] and leaving his current living situation even if it means going to a group home. The pt. made good eye contact. Pt appeared to be responding to internal stimuli. Pt had a disheveled appearance. Pt presented with a labile  mood; affect was congruent. Pt endorsed AH. Pt denied current SI/HI/V/H.   Chief Complaint:  Chief Complaint  Patient presents with   Psychiatric Evaluation    Pt presents to ED form home via police for voluntary psych. Pt refuses to answer questions for RN in triage. Pt only states that he is hearing voices. Pt denies voices saying to hurt himself or others. Pt refuses to answer RN for SI/HI questions. Pt is ambulatory in triage. Pt is alert but refuses to answer Rns orientation questions. Pt resp are even and unlabored. Pt skin is dry and warm and appropriate for ethnicity.     Visit Diagnosis: Schizoaffective disorder, bipolar type (HCC)   Cannabis use disorder, moderate, dependence (HCC)   CCA Screening, Triage and Referral (STR)  Patient Reported Information How did you hear about [place]? Self  Referral name: No data recorded Referral phone number: No data recorded  Whom do you see for routine medical problems? No data recorded Practice/Facility Name: No data recorded Practice/Facility Phone Number: No data recorded Name of Contact: No data recorded Contact Number: No data recorded Contact Fax Number: No data recorded Prescriber Name: No data recorded Prescriber Address (if known): No data recorded  What Is the Reason for Your Visit/Call Today? Patient reports being stressed.  How Long Has This Been Causing You Problems? > than 6 months  What Do You Feel Would Help You the Most Today? Treatment for Depression or other mood problem; Medication(s)   Have You Recently Been in Any Inpatient Treatment (Hospital/Detox/Crisis Center/28-Day Program)? No data recorded Name/Location of Program/Hospital:No data recorded  How Long Were You There? No data recorded When Were You Discharged? No data recorded  Have You Ever Received Services From [facility] Before? No data recorded Who Do You See at [facility]? No data recorded  Have You Recently Had Any Thoughts About Hurting Yourself?  No  Are You Planning to Commit Suicide/Harm Yourself At This time? No   Have you Recently Had Thoughts About Hurting Someone Kenneth Ohs? No  Explanation: n/a   Have You Used Any Alcohol or Drugs in the Past 24 Hours? Yes  How Long Ago Did You Use Drugs or Alcohol? No data recorded What Did You Use and How Much? Cocaine   Do You Currently Have a Therapist/Psychiatrist? Yes  Name of Therapist/Psychiatrist: ACT Team   Have You Been Recently Discharged From Any Office Practice or Programs? No  Explanation of Discharge From Practice/Program: n/a     CCA Screening Triage Referral Assessment Type of Contact: Face-to-Face  Is this Initial or Reassessment? No data recorded Date Telepsych consult ordered in [facility]:  No data recorded Time Telepsych consult ordered in [facility]:  No data recorded  Patient Reported Information Reviewed? No data recorded Patient Left Without Being Seen? No data recorded Reason for Not Completing Assessment: Patient was unable to provide coherent information.   Collateral Involvement: None provided   Does Patient Have a Automotive engineer Guardian? No data recorded Name and Contact of Legal Guardian: No data recorded If Minor and Not Living with Parent(s), Who has Custody? n/a  Is CPS involved or ever been involved? Never  Is APS involved or ever been involved? Never   Patient Determined To Be At Risk for Harm To Self or Others Based on Review of Patient Reported Information or Presenting Complaint? No  Method: No Plan  Availability of Means: No access or NA  Intent: Vague intent or NA  Notification Required: No need or identified person  Additional Information for Danger to Others Potential: -- (n/a)  Additional Comments for Danger to Others Potential: n/a  Are There Guns or Other Weapons in Your Home? No  Types of Guns/Weapons: n/a  Are These Weapons Safely Secured?                            -- (n/a)  Who Could Verify You Are Able To  Have These Secured: n/a  Do You Have any Outstanding Charges, Pending Court Dates, Parole/Probation? None reported  Contacted To Inform of Risk of Harm To Self or Others: Other: Comment (n/a)   Location of Assessment: [facility] ED   Does Patient Present under Involuntary Commitment? No  IVC Papers Initial File Date: No data recorded  [place] of Residence: [place]   Patient Currently Receiving the Following Services: Medication Management; ACTT (Assertive Community Treatment)   Determination of Need: Emergent (2 hours)   Options For Referral: ED Visit; Inpatient Hospitalization; Medication Management; Intensive Outpatient Therapy     CCA Biopsychosocial Intake/Chief Complaint:  No data recorded Current Symptoms/Problems: No data recorded  Patient Reported Schizophrenia/Schizoaffective Diagnosis in Past: No   Strengths: Some insight, seeking help, receive treatment services.  Preferences: No data recorded Abilities: No data recorded  Type of Services Patient Feels are Needed: No data recorded  Initial Clinical Notes/Concerns: No data recorded  Mental Health Symptoms Depression:   Difficulty Concentrating; Fatigue; Hopelessness   Duration of Depressive symptoms:  Greater than two weeks   Mania:   Change in energy/activity   Anxiety:  Difficulty concentrating   Psychosis:   Hallucinations   Duration of Psychotic symptoms:  Greater than six months   Trauma:   N/A   Obsessions:   N/A   Compulsions:   N/A   Inattention:   N/A   Hyperactivity/Impulsivity:   N/A   Oppositional/Defiant Behaviors:   N/A   Emotional Irregularity:   N/A   Other Mood/Personality Symptoms:  No data recorded   Mental Status Exam Appearance and self-care  Stature:   Average   Weight:   Average weight   Clothing:   Disheveled   Grooming:   Neglected   Cosmetic use:   None   Posture/gait:   Normal   Motor activity:   Not Remarkable   Sensorium   Attention:   Distractible; Confused   Concentration:   Focuses on irrelevancies; Preoccupied   Orientation:   Person; Place; Object   Recall/memory:   Normal   Affect and Mood  Affect:   Depressed   Mood:   Depressed   Relating  Eye contact:   Staring   Facial expression:   Depressed   Attitude toward examiner:   Cooperative   Thought and Language  Speech flow:  Flight of Ideas   Thought content:   Suspicious; Appropriate to Mood and Circumstances   Preoccupation:   None   Hallucinations:   Auditory   Organization:  No data recorded  [employer] of Knowledge:   Fair   Intelligence:   Average   Abstraction:   Functional   Judgement:   Fair   Dance movement psychotherapist:   Distorted   Insight:   Poor   Decision Making:   Normal   Social Functioning  Social Maturity:   Impulsive; Responsible   Social Judgement:   Chemical engineer"; Heedless   Stress  Stressors:   Housing; Surveyor, quantity; Transitions   Coping Ability:   Exhausted; Deficient supports   Skill Deficits:   Self-care   Supports:   Friends/Service system     Religion:    Leisure/Recreation:    Exercise/Diet:     CCA Employment/Education Employment/Work Situation:    Education:     CCA Family/Childhood History Family and Relationship History:    Childhood History:     Child/Adolescent Assessment:     CCA Substance Use Alcohol/Drug Use:                           ASAM's:  Six Dimensions of Multidimensional Assessment  Dimension 1:  Acute Intoxication and/or Withdrawal Potential:      Dimension 2:  Biomedical Conditions and Complications:      Dimension 3:  Emotional, Behavioral, or Cognitive Conditions and Complications:     Dimension 4:  Readiness to Change:     Dimension 5:  Relapse, Continued use, or Continued Problem Potential:     Dimension 6:  Recovery/Living Environment:     ASAM Severity Score:    ASAM Recommended  Level of Treatment:     Substance use Disorder (SUD)    Recommendations for Services/Supports/Treatments:    DSM5 Diagnoses: Patient Active Problem List   Diagnosis Date Noted   Cocaine use disorder, severe, dependence (HCC) 09/04/2022   Vitamin D deficiency, unspecified 06/08/2022   Mixed hyperlipidemia 06/08/2022   Cocaine abuse (HCC) 11/22/2021   Cannabis use disorder    Prediabetes 05/16/2020   Auditory hallucinations    Schizophrenia, paranoid (HCC) 03/25/2019   Cannabis use disorder, moderate, dependence (HCC)  04/21/2011   Tobacco use disorder 04/21/2011   Cassie Shedlock R Ramla Hase, LCAS

## 2022-11-01 NOTE — ED Notes (Signed)
Provided pt with [place] tray sandwich.

## 2022-11-01 NOTE — ED Provider Notes (Incomplete)
[facility] Provider Note    Event Date/Time   First MD Initiated Contact with Patient 11/01/22 2004     (approximate)   History   Psychiatric Evaluation (Pt presents to ED form home via police for voluntary psych. Pt refuses to answer questions for RN in triage. Pt only states that he is hearing voices. Pt denies voices saying to hurt himself or others. Pt refuses to answer RN for SI/HI questions. Pt is ambulatory in triage. Pt is alert but refuses to answer Rns orientation questions. Pt resp are even and unlabored. Pt skin is dry and warm and appropriate for ethnicity. /)   HPI  Kenneth Jensen is a 32 y.o. male  here with hearing voices. Pt refuses to answer questions. He tells me that he is here because he has been hearing things and needs help, but refuses any other interview.       Physical Exam   Triage Vital Signs: ED Triage Vitals  Encounter Vitals Group     BP 11/01/22 1938 (!) 154/86     Systolic BP Percentile --      Diastolic BP Percentile --      Pulse Rate 11/01/22 1938 81     Resp 11/01/22 1938 18     Temp 11/01/22 1938 98.5 F (36.9 C)     Temp src --      SpO2 11/01/22 1938 99 %     Weight 11/01/22 1930 213 lb 13.5 oz (97 kg)     Height 11/01/22 1930 5\' 6"  (1.676 m)     Head Circumference --      Peak Flow --      Pain Score 11/01/22 1930 0     Pain Loc --      Pain Education --      Exclude from Growth Chart --     Most recent vital signs: Vitals:   11/02/22 0946 11/02/22 1000  BP: (!) 109/57 (!) 112/58  Pulse: 69 72  Resp: 16 16  Temp: 98.4 F (36.9 C) 98.4 F (36.9 C)  SpO2: 98% 98%     General: Awake, no distress.  CV:  Good peripheral perfusion.  Resp:  Normal work of breathing.  Abd:  No distention.  Other:  Occasionally RTIS, hallucinating. No SI.   ED Results / Procedures / Treatments   Labs (all labs ordered are listed, but only abnormal results are displayed) Labs Reviewed  COMPREHENSIVE  METABOLIC PANEL - Abnormal; Notable for the following components:      Result Value   Sodium 134 (*)    Glucose, Bld 107 (*)    Calcium 8.6 (*)    All other components within normal limits  SALICYLATE LEVEL - Abnormal; Notable for the following components:   Salicylate Lvl <7.0 (*)    All other components within normal limits  ACETAMINOPHEN LEVEL - Abnormal; Notable for the following components:   Acetaminophen (Tylenol), Serum <10 (*)    All other components within normal limits  URINE DRUG SCREEN, QUALITATIVE ([facility] ONLY) - Abnormal; Notable for the following components:   Cannabinoid 50 Ng, Ur [place] POSITIVE (*)    All other components within normal limits  ETHANOL  CBC     EKG    RADIOLOGY    I also independently reviewed and agree with radiologist interpretations.   PROCEDURES:  Critical Care performed: No   MEDICATIONS ORDERED IN ED: Medications - No data to display   IMPRESSION / MDM /  ASSESSMENT AND PLAN / ED COURSE  I reviewed the triage vital signs and the nursing notes.                              Differential diagnosis includes, but is not limited to, schizophrenia, substance-induced mood d/o, depression with psychosis  Patient's presentation is most consistent with acute presentation with potential threat to life or bodily function.  The patient is on the cardiac monitor to evaluate for evidence of arrhythmia and/or significant heart rate changes  32 yo M with h/o schizophrenia here with paranoia, hallucinations. IVCed. Screening labs unremarkable. No apparent emergent medical condition. Psych/TTS dispo.   FINAL CLINICAL IMPRESSION(S) / ED DIAGNOSES   Final diagnoses:  Paranoid schizophrenia (HCC)     Rx / DC Orders   ED Discharge Orders     None        Note:  This document was prepared using Dragon voice recognition software and may include unintentional dictation errors.   Shaune Pollack, MD 11/04/22 0000    Shaune Pollack,  MD 11/04/22 0000

## 2022-11-01 NOTE — BH Assessment (Signed)
Patient has been accepted to [facility].  Patient assigned to [address]. Accepting physician is Dr. Jaclynn Major.  Call report to [phone].  Representative was [employer].   ER Staff is aware of it:  Rivka Barbara, ER Secretary  Dr. York Cerise, ER MD  Genella Rife, Patient's Nurse     Patient can arrive at facility 11/02/22 after 8 AM.

## 2022-11-02 DIAGNOSIS — F200 Paranoid schizophrenia: Secondary | ICD-10-CM | POA: Diagnosis not present

## 2022-11-02 DIAGNOSIS — Z20828 Contact with and (suspected) exposure to other viral communicable diseases: Secondary | ICD-10-CM | POA: Diagnosis not present

## 2022-11-02 DIAGNOSIS — F28 Other psychotic disorder not due to a substance or known physiological condition: Secondary | ICD-10-CM | POA: Diagnosis not present

## 2022-11-02 DIAGNOSIS — J309 Allergic rhinitis, unspecified: Secondary | ICD-10-CM | POA: Diagnosis not present

## 2022-11-02 DIAGNOSIS — M79671 Pain in right foot: Secondary | ICD-10-CM | POA: Diagnosis not present

## 2022-11-02 DIAGNOSIS — F419 Anxiety disorder, unspecified: Secondary | ICD-10-CM | POA: Diagnosis not present

## 2022-11-02 DIAGNOSIS — E559 Vitamin D deficiency, unspecified: Secondary | ICD-10-CM | POA: Diagnosis not present

## 2022-11-02 DIAGNOSIS — Z79899 Other long term (current) drug therapy: Secondary | ICD-10-CM | POA: Diagnosis not present

## 2022-11-02 DIAGNOSIS — G4700 Insomnia, unspecified: Secondary | ICD-10-CM | POA: Diagnosis not present

## 2022-11-02 DIAGNOSIS — J45998 Other asthma: Secondary | ICD-10-CM | POA: Diagnosis not present

## 2022-11-02 DIAGNOSIS — J45909 Unspecified asthma, uncomplicated: Secondary | ICD-10-CM | POA: Diagnosis not present

## 2022-11-02 DIAGNOSIS — R460 Very low level of personal hygiene: Secondary | ICD-10-CM | POA: Diagnosis not present

## 2022-11-02 DIAGNOSIS — E538 Deficiency of other specified B group vitamins: Secondary | ICD-10-CM | POA: Diagnosis not present

## 2022-11-02 DIAGNOSIS — F1410 Cocaine abuse, uncomplicated: Secondary | ICD-10-CM | POA: Diagnosis not present

## 2022-11-02 DIAGNOSIS — F1290 Cannabis use, unspecified, uncomplicated: Secondary | ICD-10-CM | POA: Diagnosis not present

## 2022-11-02 DIAGNOSIS — F1420 Cocaine dependence, uncomplicated: Secondary | ICD-10-CM | POA: Diagnosis not present

## 2022-11-02 DIAGNOSIS — F17210 Nicotine dependence, cigarettes, uncomplicated: Secondary | ICD-10-CM | POA: Diagnosis not present

## 2022-11-02 DIAGNOSIS — F17200 Nicotine dependence, unspecified, uncomplicated: Secondary | ICD-10-CM | POA: Diagnosis not present

## 2022-11-02 DIAGNOSIS — R070 Pain in throat: Secondary | ICD-10-CM | POA: Diagnosis not present

## 2022-11-02 NOTE — ED Notes (Signed)
Transportation on way to get pt.

## 2022-11-02 NOTE — ED Notes (Signed)
EMTALA Reviewed by this RN.  

## 2022-11-02 NOTE — ED Notes (Signed)
Steward Drone, RN from [facility] called back, gave report to her. Pt has departed from ED.

## 2022-11-02 NOTE — ED Notes (Signed)
Pt ambulatory to restroom with steady gait.

## 2022-11-14 ENCOUNTER — Other Ambulatory Visit: Payer: Self-pay

## 2022-11-14 ENCOUNTER — Emergency Department
Admission: EM | Admit: 2022-11-14 | Discharge: 2022-11-15 | Disposition: A | Discharge status: Other Acute Inpatient Hospital | Payer: Medicare HMO | Attending: Emergency Medicine | Admitting: Emergency Medicine

## 2022-11-14 DIAGNOSIS — F23 Brief psychotic disorder: Secondary | ICD-10-CM

## 2022-11-14 DIAGNOSIS — F29 Unspecified psychosis not due to a substance or known physiological condition: Secondary | ICD-10-CM | POA: Diagnosis not present

## 2022-11-14 DIAGNOSIS — F200 Paranoid schizophrenia: Secondary | ICD-10-CM | POA: Diagnosis present

## 2022-11-14 DIAGNOSIS — J45909 Unspecified asthma, uncomplicated: Secondary | ICD-10-CM | POA: Insufficient documentation

## 2022-11-14 DIAGNOSIS — F17210 Nicotine dependence, cigarettes, uncomplicated: Secondary | ICD-10-CM | POA: Diagnosis not present

## 2022-11-14 LAB — BASIC METABOLIC PANEL
Anion gap: 12 (ref 5–15)
BUN: 9 mg/dL (ref 6–20)
CO2: 22 mmol/L (ref 22–32)
Calcium: 8.4 mg/dL — ABNORMAL LOW (ref 8.9–10.3)
Chloride: 100 mmol/L (ref 98–111)
Creatinine, Ser: 0.76 mg/dL (ref 0.61–1.24)
GFR, Estimated: >60 mL/min (ref >60)
Glucose, Bld: 102 mg/dL — ABNORMAL HIGH (ref 70–99)
Potassium: 3.3 mmol/L — ABNORMAL LOW (ref 3.5–5.1)
Sodium: 134 mmol/L — ABNORMAL LOW (ref 135–145)

## 2022-11-14 LAB — URINE DRUG SCREEN, QUALITATIVE (ARMC ONLY)
Amphetamines, Ur Screen: NOT DETECTED
Barbiturates, Ur Screen: NOT DETECTED
Benzodiazepine, Ur Scrn: NOT DETECTED
Cannabinoid 50 Ng, Ur ~~LOC~~: POSITIVE — AB
Cocaine Metabolite,Ur ~~LOC~~: NOT DETECTED
MDMA (Ecstasy)Ur Screen: NOT DETECTED
Methadone Scn, Ur: NOT DETECTED
Opiate, Ur Screen: NOT DETECTED
Phencyclidine (PCP) Ur S: NOT DETECTED
Tricyclic, Ur Screen: NOT DETECTED

## 2022-11-14 LAB — CBC
HCT: 40.9 % (ref 39.0–52.0)
Hemoglobin: 13.6 g/dL (ref 13.0–17.0)
MCH: 29.2 pg (ref 26.0–34.0)
MCHC: 33.3 g/dL (ref 30.0–36.0)
MCV: 88 fL (ref 80.0–100.0)
Platelets: 317 10*3/uL (ref 150–400)
RBC: 4.65 MIL/uL (ref 4.22–5.81)
RDW: 13.2 % (ref 11.5–15.5)
WBC: 10.2 10*3/uL (ref 4.0–10.5)
nRBC: 0 % (ref 0.0–0.2)

## 2022-11-14 LAB — ACETAMINOPHEN LEVEL: Acetaminophen (Tylenol), Serum: <10 ug/mL — ABNORMAL LOW (ref 10–30)

## 2022-11-14 LAB — VALPROIC ACID LEVEL: Valproic Acid Lvl: 12 ug/mL — ABNORMAL LOW (ref 50.0–100.0)

## 2022-11-14 LAB — SALICYLATE LEVEL: Salicylate Lvl: <7 mg/dL — ABNORMAL LOW (ref 7.0–30.0)

## 2022-11-14 MED ORDER — POTASSIUM CHLORIDE CRYS ER 20 MEQ PO TBCR
40.0000 meq | EXTENDED_RELEASE_TABLET | Freq: Once | ORAL | Status: AC
  Administered 2022-11-14: 40 meq via ORAL
  Filled 2022-11-14: qty 2

## 2022-11-14 MED ORDER — MIDAZOLAM HCL 2 MG/2ML IJ SOLN
4.0000 mg | Freq: Once | INTRAMUSCULAR | Status: AC
  Administered 2022-11-14: 4 mg via INTRAMUSCULAR
  Filled 2022-11-14: qty 4

## 2022-11-14 MED ORDER — LORAZEPAM 2 MG PO TABS
3.0000 mg | ORAL_TABLET | Freq: Once | ORAL | Status: AC
  Administered 2022-11-14: 3 mg via ORAL
  Filled 2022-11-14: qty 1

## 2022-11-14 MED ORDER — DIVALPROEX SODIUM ER 250 MG PO TB24
1000.0000 mg | ORAL_TABLET | Freq: Every day | ORAL | Status: DC
  Administered 2022-11-14: 1000 mg via ORAL
  Filled 2022-11-14: qty 4

## 2022-11-14 MED ORDER — QUETIAPINE FUMARATE 200 MG PO TABS
200.0000 mg | ORAL_TABLET | Freq: Every day | ORAL | Status: DC
  Administered 2022-11-14: 200 mg via ORAL
  Filled 2022-11-14: qty 1

## 2022-11-14 MED ORDER — DIPHENHYDRAMINE HCL 50 MG/ML IJ SOLN
50.0000 mg | Freq: Once | INTRAMUSCULAR | Status: AC
  Administered 2022-11-14: 50 mg via INTRAMUSCULAR
  Filled 2022-11-14: qty 1

## 2022-11-14 MED ORDER — HYDROXYZINE HCL 25 MG PO TABS
50.0000 mg | ORAL_TABLET | Freq: Three times a day (TID) | ORAL | Status: DC | PRN
  Filled 2022-11-14: qty 2

## 2022-11-14 NOTE — BH Assessment (Signed)
Comprehensive Clinical Assessment (CCA) Note  11/14/2022 Kenneth Jensen [id number]  Chief Complaint:  Chief Complaint  Patient presents with   Psychiatric Evaluation   Visit Diagnosis: Schizophrenia   Kenneth Jensen is a 32 year old male who presents to the ER, after he was seen at [facility]). Patient has history of noncompliance with his medications and presents with similar presentation to the ER. Upon arrival to the ER, the patient was tangential and focus on things that wasn't asked. Per the IVC, the patient was petitioned by his ACT Team for severe and paranoia, psychosis.  During the interview the patient was guarded and withdrawn, he would answer some questions and other times he would answer questions with a question that wasn't relative to what was asked.  CCA Screening, Triage and Referral (STR)  Patient Reported Information How did you hear about [place]? Other (Comment)  What Is the Reason for Your Visit/Call Today? Patient brought from [facility] due to psychosis.  How Long Has This Been Causing You Problems? 1 wk - 1 month  What Do You Feel Would Help You the Most Today? Treatment for Depression or other mood problem   Have You Recently Had Any Thoughts About Hurting Yourself? No  Are You Planning to Commit Suicide/Harm Yourself At This time? No   Flowsheet Row ED from 11/14/2022 in [facility] Emergency Department at [facility] ED from 11/01/2022 in [facility] Emergency Department at [facility] Admission (Discharged[facility]  C-SSRS RISK CATEGORY No Risk No Risk No Risk       Have you Recently Had Thoughts About Hurting Someone Kenneth Jensen? No  Are You Planning to Harm Someone at This Time? No  Explanation: n/a   Have You Used Any Alcohol or Drugs in the Past 24 Hours? Yes  What Did You Use and How Much? Cannabis   Do You Currently Have a Therapist/Psychiatrist?  Yes  Name of Therapist/Psychiatrist:    Have You Been Recently Discharged From Any Office Practice or Programs? No  Explanation of Discharge From Practice/Program: n/a     CCA Screening Triage Referral Assessment Type of Contact: Face-to-Face  Telemedicine Service Delivery:   Is this Initial or Reassessment?   Date Telepsych consult ordered in [facility]:    Time Telepsych consult ordered in [facility]:    Location of Assessment: [facility] ED  Provider Location: [facility] ED   Collateral Involvement: None provided  Does Patient Have a Court Appointed Legal Guardian? No  Legal Guardian Contact Information: n/a  Copy of Legal Guardianship Form: -- (n/a)  Legal Guardian Notified of Arrival: -- (n/a)  Legal Guardian Notified of Pending Discharge: -- (n/a)  If Minor and Not Living with Parent(s), Who has Custody? n/a  Is CPS involved or ever been involved? Never  Is APS involved or ever been involved? Never   Patient Determined To Be At Risk for Harm To Self or Others Based on Review of Patient Reported Information or Presenting Complaint? No  Method: No Plan  Availability of Means: No access or NA  Intent: Vague intent or NA  Notification Required: No need or identified person  Additional Information for Danger to Others Potential: Active psychosis  Additional Comments for Danger to Others Potential: n/a  Are There Guns or Other Weapons in Your Home? No  Types of Guns/Weapons: n/a  Are These Weapons Safely Secured?  No  Who Could Verify You Are Able To Have These Secured: n/a  Do You Have any Outstanding Charges, Pending Court Dates, Parole/Probation? None reported  Contacted To Inform of Risk of Harm To Self or Others: Other: Comment    Does Patient Present under Involuntary Commitment? Yes    [place] of Residence: [place]   Patient Currently Receiving the Following Services: Medication Management   Determination of Need: Emergent (2  hours)   Options For Referral: Inpatient Hospitalization; ED Visit     CCA Biopsychosocial Patient Reported Schizophrenia/Schizoaffective Diagnosis in Past: Yes   Strengths: Have some insight, seeking help and able to take care of his ADL's.   Mental Health Symptoms Depression:   Difficulty Concentrating   Duration of Depressive symptoms:  Duration of Depressive Symptoms: N/A   Mania:   Irritability; Racing thoughts; Overconfidence; Recklessness   Anxiety:    Difficulty concentrating   Psychosis:   Hallucinations   Duration of Psychotic symptoms:  Duration of Psychotic Symptoms: N/A   Trauma:   N/A   Obsessions:   N/A   Compulsions:   N/A   Inattention:   N/A   Hyperactivity/Impulsivity:   N/A   Oppositional/Defiant Behaviors:   N/A   Emotional Irregularity:   N/A   Other Mood/Personality Symptoms:   n/a    Mental Status Exam Appearance and self-care  Stature:   Average   Weight:   Average weight   Clothing:   Disheveled   Grooming:   Neglected   Cosmetic use:   None   Posture/gait:   Normal   Motor activity:   -- (Within normal range)   Sensorium  Attention:   Distractible   Concentration:   Scattered   Orientation:   X5   Recall/memory:   -- (Unable to assess)   Affect and Mood  Affect:   Labile   Mood:   Anxious   Relating  Eye contact:   Normal   Facial expression:   Anxious; Tense   Attitude toward examiner:   Uninterested; Defensive   Thought and Language  Speech flow:  Flight of Ideas; Pressured   Thought content:   Appropriate to Mood and Circumstances   Preoccupation:   Ruminations   Hallucinations:   Auditory   Organization:   Disorganized; Environmental manager of Knowledge:   Fair   Intelligence:   Average   Abstraction:   Concrete   Judgement:   Impaired   Reality Testing:   Distorted   Insight:   Poor   Decision Making:   Impulsive;  Confused   Social Functioning  Social Maturity:   Isolates   Social Judgement:   "Chief of Staff"; Naive; Heedless   Stress  Stressors:   Other (Comment)   Coping Ability:   Overwhelmed   Skill Deficits:   Self-care   Supports:   Family     Religion: Religion/Spirituality Are You A Religious Person?: No  Leisure/Recreation: Leisure / Recreation Do You Have Hobbies?: No  Exercise/Diet: Exercise/Diet Do You Exercise?: No Have You Gained or Lost A Significant Amount of Weight in the Past Six Months?: No Do You Follow a Special Diet?: No Do You Have Any Trouble Sleeping?: No   CCA Employment/Education Employment/Work Situation: Employment / Work Situation Employment Situation: On disability Why is Patient on Disability: Mental Health Dx. Has Patient ever Been in the [employer]?: No  Education: Education Is Patient Currently Attending School?: No Did You Attend College?: No Did You Have  An Individualized Education Program (IIEP): No Did You Have Any Difficulty At School?: No Patient's Education Has Been Impacted by Current Illness: No   CCA Family/Childhood History Family and Relationship History: Family history Marital status: Single Does patient have children?: No  Childhood History:  Childhood History By whom was/is the patient raised?: Mother Did patient suffer any verbal/emotional/physical/sexual abuse as a child?: No Did patient suffer from severe childhood neglect?: No Has patient ever been sexually abused/assaulted/raped as an adolescent or adult?: No Was the patient ever a victim of a crime or a disaster?: No Witnessed domestic violence?: No Has patient been affected by domestic violence as an adult?: No  CCA Substance Use Alcohol/Drug Use: Alcohol / Drug Use Pain Medications: See MAR Prescriptions: See MAR Over the Counter: See MAR History of alcohol / drug use?: Yes Longest period of sobriety (when/how long): Unable to  quantify Substance #1 Name of Substance 1: Cannabis   ASAM's:  Six Dimensions of Multidimensional Assessment  Dimension 1:  Acute Intoxication and/or Withdrawal Potential:      Dimension 2:  Biomedical Conditions and Complications:      Dimension 3:  Emotional, Behavioral, or Cognitive Conditions and Complications:     Dimension 4:  Readiness to Change:     Dimension 5:  Relapse, Continued use, or Continued Problem Potential:     Dimension 6:  Recovery/Living Environment:     ASAM Severity Score:    ASAM Recommended Level of Treatment:     Substance use Disorder (SUD)    Recommendations for Services/Supports/Treatments:    Discharge Disposition:    DSM5 Diagnoses: Patient Active Problem List   Diagnosis Date Noted   Acute psychosis (HCC) 11/14/2022   Cocaine use disorder, severe, dependence (HCC) 09/04/2022   Vitamin D deficiency, unspecified 06/08/2022   Mixed hyperlipidemia 06/08/2022   Cocaine abuse (HCC) 11/22/2021   Cannabis use disorder    Prediabetes 05/16/2020   Auditory hallucinations    Paranoid schizophrenia (HCC) 03/25/2019   Cannabis use disorder, moderate, dependence (HCC) 04/21/2011   Tobacco use disorder 04/21/2011   Referrals to Alternative Service(s): Referred to Alternative Service(s):   Place:   Date:   Time:    Referred to Alternative Service(s):   Place:   Date:   Time:    Referred to Alternative Service(s):   Place:   Date:   Time:    Referred to Alternative Service(s):   Place:   Date:   Time:     Lilyan Gilford MS, LCAS, [facility], [facility] Therapeutic Triage Specialist 11/14/2022 4:27 PM

## 2022-11-14 NOTE — BH Assessment (Deleted)
Patient has been accepted to [facility].  Patient assigned to 3 Chad.  Accepting physician is Dr. Daleen Snook.  Call report to [phone].  Representative was [employer].   ER Staff is aware of it:  Adair Laundry, ER Secretary  Dr. Erma Heritage, ER MD  Shawna Orleans, Patient's Nurse     Patient can arrival at facility 11/15/22 after 7 AM.

## 2022-11-14 NOTE — ED Notes (Signed)
IVC  CONSULT  DONE  PENDING  PLACEMENT 

## 2022-11-14 NOTE — ED Provider Notes (Signed)
[facility] Provider Note    None    (approximate)   History   EM caveat: Paranoia or alteration mental status  HPI  Kenneth Jensen is a 32 y.o. male history of documented paranoid schizophrenia   By record review it appears patient has a history of schizophrenia, was recently transported to [facility] where it was presumed that he was hospitalized.  Today's history includes patient was brought by his ACT team from [facility] for concerns of psychosis and need for further psychiatric evaluation  Physical Exam   Triage Vital Signs: ED Triage Vitals  Encounter Vitals Group     BP      Systolic BP Percentile      Diastolic BP Percentile      Pulse      Resp      Temp      Temp src      SpO2      Weight      Height      Head Circumference      Peak Flow      Pain Score      Pain Loc      Pain Education      Exclude from Growth Chart     Most recent vital signs: Vitals:   11/14/22 1515 11/14/22 1530  BP: 115/80 113/78  Pulse: 88 87  Resp: 18 18  Temp: 98.2 F (36.8 C) 98.5 F (36.9 C)  SpO2: 99% 98%    exam  The patient's respirations are even and unlabored he appears warm and well-perfused.  He is quite aggressive yelling at times, but when I entered the room he would not tell me his name but tells me to "leave" saying this several times in a rather agitated fashion.  He also appears to be perhaps responding to some external stimuli.  He is in no acute distress, and he lays down in the bed and put blankets over him but when approached becomes very paranoid and somewhat agitated.  He is not obviously combative or violent at this time.  He does not appear to be in any obvious distress, but obtaining initial vital signs, establishing labs, etc. is slightly challenged by his what appears to be severe paranoia.  Will trial oral medications and reassess, if unable to take a will refuse, consider IM medications to facilitate care as he is under  IVC for safety  ED Results / Procedures / Treatments   Labs (all labs ordered are listed, but only abnormal results are displayed) Labs Reviewed  URINE DRUG SCREEN, QUALITATIVE ([facility] ONLY) - Abnormal; Notable for the following components:      Result Value   Cannabinoid 50 Ng, Ur [place] POSITIVE (*)    All other components within normal limits  CBC  VALPROIC ACID LEVEL  BASIC METABOLIC PANEL  ACETAMINOPHEN LEVEL  SALICYLATE LEVEL     EKG     RADIOLOGY     PROCEDURES:  Critical Care performed: No  Procedures   MEDICATIONS ORDERED IN ED: Medications  divalproex (DEPAKOTE ER) 24 hr tablet 1,000 mg (has no administration in time range)  hydrOXYzine (ATARAX) tablet 50 mg (has no administration in time range)  QUEtiapine (SEROQUEL) tablet 200 mg (has no administration in time range)  LORazepam (ATIVAN) tablet 3 mg (3 mg Oral Given 11/14/22 1423)  midazolam (VERSED) injection 4 mg (4 mg Intramuscular Given 11/14/22 1448)  diphenhydrAMINE (BENADRYL) injection 50 mg (50 mg Intramuscular Given 11/14/22 1448)  IMPRESSION / MDM / ASSESSMENT AND PLAN / ED COURSE  I reviewed the triage vital signs and the nursing notes.                              Differential diagnosis includes, but is not limited to, mania, psychosis, underlying schizophrenia.  He does not appear to be obviously suffering from an acute medical illness though medical screening's labs are pending.  He was here brought by [facility]/ACT team for concerns of severe paranoia and is evidently seen there on a regular basis for her medical care related to his severe schizophrenia.  Today I suspect he is likely experiencing exacerbation he is placed under IVC for safety as I suspect he is quite delusional and paranoid.  Patient's presentation is most consistent with acute presentation with potential threat to life or bodily function.         ----------------------------------------- 2:53 PM on  11/14/2022 ----------------------------------------- Patient was originally excepting and was medicated with oral medication including Ativan.  However now he is outside the room, posturing agitated aggression, yelling about "crack heads as nurses!"  And extremely agitated.  He has not shown acute violence towards staff but his mood is escalating rapidly agitated and noncompliant with care request.  He is suffering acute psychosis at this time is my impression.  I have ordered chemical restraint including midazolam and Benadryl.  A order for manual hold to administer medication has been placed.    Medical screening labs are pending, Dr. Erma Heritage to follow-up.  See face-to-face evaluation flowsheet.  Patient condition much improved after IM medications.  FINAL CLINICAL IMPRESSION(S) / ED DIAGNOSES   Final diagnoses:  Acute psychosis (HCC)     Rx / DC Orders   ED Discharge Orders     None        Note:  This document was prepared using Dragon voice recognition software and may include unintentional dictation errors.   Sharyn Creamer, MD 11/14/22 [phone]

## 2022-11-14 NOTE — ED Notes (Signed)
Patient agitated and yelling , MD order for Im medication to help settle him down. Staff will continue to monitor.

## 2022-11-14 NOTE — ED Notes (Signed)
Patient transferred from ED lobby to room 20 with escort , He is loud and agitated, but can be redirected, 2 of His act-team persons also with him and states that He has been more paranoid and get angry easily and they haven't seen him like this is quite a while. Nurse listened and let them know the Doctor will be evaluating Him soon.

## 2022-11-14 NOTE — ED Triage Notes (Signed)
Delay in initial triage due to pt being combative and uncooperative in triage.  Pt to ED from [facility] via [facility] employee for abnormal behavior. Pt is known by ED staff.  This RN walked into shift report from off coming nurse who was preparing to medicate him. RN Toniann Fail advised he would not take meds from her and maybe I could try. Pt was agitated but calm enough to allow this RN to admin his IM injection.

## 2022-11-14 NOTE — BH Assessment (Signed)
Per [facility] AC Tresa Endo), patient to be referred out of system.  Referral information for Psychiatric Hospitalization faxed to;   [facility] [phone]- [phone]) No appropriate beds  [place] [phone]---[phone]),  [place] [phone] or [phone])  [place] [phone]--- [phone]--- [phone]--- [phone])  [address] [phone]),   [facility] [phone] -or- [phone]),   [facility] [phone]),  [place] [phone])  [facility] [phone])

## 2022-11-14 NOTE — ED Notes (Signed)
Patient refused taking medications for this nurse, but did take them for CC the NP. Patient is labile, tangential , staff will continue to monitor for safety.

## 2022-11-14 NOTE — BH Assessment (Signed)
Patient has been accepted to [facility].  Patient assigned to700. Accepting physician is Dr. Jaclynn Major.  Call report to [phone].  Representative was [employer].   ER Staff is aware of it:  Adair Laundry, ER Secretary  Dr. Erma Heritage, ER MD  [facility] Patient's Nurse     Patient can arrive 11/15/22 at 9 AM.

## 2022-11-14 NOTE — Consult Note (Signed)
[facility] Face-to-Face Psychiatry Consult   Reason for Consult: Psychiatric evaluation Referring Physician: Dr. Fanny Bien Patient Identification: Kenneth Jensen MRN:  [id number] Principal Diagnosis: <principal problem not specified> Diagnosis:  Active Problems:   Paranoid schizophrenia (HCC)   Acute psychosis (HCC)   Total Time spent with patient: 30 minutes  Subjective:   Kenneth Jensen is a 32 y.o. male patient admitted with acute psychosis.  "I need a shot in my arm or my buttocks.  I don't do Haldol ".  HPI:  Kenneth Jensen is a 32 y.o. male patient admitted with acute psychosis.  Patient noted with bizarre presentation, excessively blinking his eyes and avoiding eye contact, while sucking his thumb with his middle finger up.  Patient with limited engagement and displaying paranoia, refusing to accept medications from his assigned RN, stating "no, something's wrong with her fingers ".  Patient accepted medications from alternative staff, stating do not leave me.  Patient is guarded and suspicious of others.  Patient noted yelling aggressively on the unit.  He appears to be responding to internal stimuli. He has a psychiatric history of paranoid schizophrenia, cannabis use disorder, tobacco use disorder, and cocaine use disorder.  Patient has a history of multiple psychiatric hospitalizations with his last 1 being last month.  He is established with [facility] as his psychiatric community provider, which is the organization that recommended him to [facility] ED due to psychosis and abnormal behavior.  UDS positive for cannabinoid.   Past Psychiatric History: Paranoid schizophrenia, cannabis use disorder, tobacco use disorder, cocaine use disorder.  Risk to Self:  High risk Risk to Others:  High risk Prior Inpatient Therapy:  Yes Prior Outpatient Therapy:  Yes  Past Medical History:  Past Medical History:  Diagnosis Date   Asthma    Bipolar 1 disorder (HCC)    Depression     Schizophrenia (HCC)    Schizotaxia    History reviewed. No pertinent surgical history. Family History: History reviewed. No pertinent family history. Family Psychiatric  History: None reported Social History:  Social History   Substance and Sexual Activity  Alcohol Use Yes   Comment: social     Social History   Substance and Sexual Activity  Drug Use Yes   Types: Marijuana, Cocaine   Comment: last marijuana use 09/02/22    Social History   Socioeconomic History   Marital status: Single    Spouse name: Not on file   Number of children: Not on file   Years of education: Not on file   Highest education level: Not on file  Occupational History   Not on file  Tobacco Use   Smoking status: Every Day    Current packs/day: 0.50    Types: Cigarettes   Smokeless tobacco: Never  Substance and Sexual Activity   Alcohol use: Yes    Comment: social   Drug use: Yes    Types: Marijuana, Cocaine    Comment: last marijuana use 09/02/22   Sexual activity: Not Currently  Other Topics Concern   Not on file  Social History Narrative   ** Merged History Encounter **       Social Determinants of Health   Financial Resource Strain: Unknown (01/30/2018)   Received from [facility], [facility]   Overall Financial Resource Strain (CARDIA)    Difficulty of Paying Living Expenses: Patient declined  Food Insecurity: Food Insecurity Present (09/04/2022)   Hunger Vital Sign    Worried About Running Out  of Food in the Last Year: Sometimes true    Ran Out of Food in the Last Year: Sometimes true  Transportation Needs: Unmet Transportation Needs (09/04/2022)   PRAPARE - Administrator, Civil Service (Medical): Yes    Lack of Transportation (Non-Medical): Yes  Physical Activity: Unknown (01/30/2018)   Received from [facility], [facility]   Exercise Vital Sign    Days of Exercise per Week: Patient declined     Minutes of Exercise per Session: Patient declined  Stress: Unknown (01/30/2018)   Received from [facility], [facility]   [employer] of Occupational Health - Occupational Stress Questionnaire    Feeling of Stress : Patient declined  Social Connections: Unknown (01/30/2018)   Received from [facility], [facility]   Social Connection and Isolation Panel [NHANES]    Frequency of Communication with Friends and Family: Patient declined    Frequency of Social Gatherings with Friends and Family: Patient declined    Attends Religious Services: Patient declined    Database administrator or Organizations: Patient declined    Attends Banker Meetings: Patient declined    Marital Status: Patient declined   Additional Social History:    Allergies:   Allergies  Allergen Reactions   Penicillins Anaphylaxis   Amoxicillin Swelling   Haldol [Haloperidol]     Pt states "I went crazy"   Percocet [Oxycodone-Acetaminophen]    Vicodin [Hydrocodone-Acetaminophen] Itching   Vicodin [Hydrocodone-Acetaminophen]     Labs:  Results for orders placed or performed during the hospital encounter of 11/14/22 (from the past 48 hour(s))  Urine Drug Screen, Qualitative ([facility] only)     Status: Abnormal   Collection Time: 11/14/22  2:15 PM  Result Value Ref Range   Tricyclic, Ur Screen NONE DETECTED NONE DETECTED   Amphetamines, Ur Screen NONE DETECTED NONE DETECTED   MDMA (Ecstasy)Ur Screen NONE DETECTED NONE DETECTED   Cocaine Metabolite,Ur [place] NONE DETECTED NONE DETECTED   Opiate, Ur Screen NONE DETECTED NONE DETECTED   Phencyclidine (PCP) Ur S NONE DETECTED NONE DETECTED   Cannabinoid 50 Ng, Ur [place] POSITIVE (A) NONE DETECTED   Barbiturates, Ur Screen NONE DETECTED NONE DETECTED   Benzodiazepine, Ur Scrn NONE DETECTED NONE DETECTED   Methadone Scn, Ur NONE DETECTED NONE DETECTED    Comment: (NOTE) Tricyclics +  metabolites, urine    Cutoff 1000 ng/mL Amphetamines + metabolites, urine  Cutoff 1000 ng/mL MDMA (Ecstasy), urine              Cutoff 500 ng/mL Cocaine Metabolite, urine          Cutoff 300 ng/mL Opiate + metabolites, urine        Cutoff 300 ng/mL Phencyclidine (PCP), urine         Cutoff 25 ng/mL Cannabinoid, urine                 Cutoff 50 ng/mL Barbiturates + metabolites, urine  Cutoff 200 ng/mL Benzodiazepine, urine              Cutoff 200 ng/mL Methadone, urine                   Cutoff 300 ng/mL  The urine drug screen provides only a preliminary, unconfirmed analytical test result and should not be used for non-medical purposes. Clinical consideration and professional judgment should be applied to any positive drug screen result due to possible  interfering substances. A more specific alternate chemical method must be used in order to obtain a confirmed analytical result. Gas chromatography / mass spectrometry (GC/MS) is the preferred confirm atory method. Performed at [facility], [address] [place], [place] [address]   Valproic acid level     Status: Abnormal   Collection Time: 11/14/22  3:41 PM  Result Value Ref Range   Valproic Acid Lvl 12 (L) 50.0 - 100.0 ug/mL    Comment: Performed at [facility], [address] [place], [place] [address]  CBC     Status: None   Collection Time: 11/14/22  3:41 PM  Result Value Ref Range   WBC 10.2 4.0 - 10.5 K/uL   RBC 4.65 4.22 - 5.81 MIL/uL   Hemoglobin 13.6 13.0 - 17.0 g/dL   HCT 09.8 11.9 - 14.7 %   MCV 88.0 80.0 - 100.0 fL   MCH 29.2 26.0 - 34.0 pg   MCHC 33.3 30.0 - 36.0 g/dL   RDW 82.9 56.2 - 13.0 %   Platelets 317 150 - 400 K/uL   nRBC 0.0 0.0 - 0.2 %    Comment: Performed at [facility], [address] [place], [place] [address]  Basic metabolic panel     Status: Abnormal   Collection Time: 11/14/22  3:41 PM  Result Value Ref Range   Sodium 134 (L) 135 - 145 mmol/L   Potassium  3.3 (L) 3.5 - 5.1 mmol/L   Chloride 100 98 - 111 mmol/L   CO2 22 22 - 32 mmol/L   Glucose, Bld 102 (H) 70 - 99 mg/dL    Comment: Glucose reference range applies only to samples taken after fasting for at least 8 hours.   BUN 9 6 - 20 mg/dL   Creatinine, Ser 4.69 0.61 - 1.24 mg/dL   Calcium 8.4 (L) 8.9 - 10.3 mg/dL   GFR, Estimated >62 >95 mL/min    Comment: (NOTE) Calculated using the CKD-EPI Creatinine Equation (2021)    Anion gap 12 5 - 15    Comment: Performed at [facility], [address] [place], [place] [address]  Acetaminophen level     Status: Abnormal   Collection Time: 11/14/22  3:41 PM  Result Value Ref Range   Acetaminophen (Tylenol), Serum <10 (L) 10 - 30 ug/mL    Comment: (NOTE) Therapeutic concentrations vary significantly. A range of 10-30 ug/mL  may be an effective concentration for many patients. However, some  are best treated at concentrations outside of this range. Acetaminophen concentrations >150 ug/mL at 4 hours after ingestion  and >50 ug/mL at 12 hours after ingestion are often associated with  toxic reactions.  Performed at [facility], [address] [place], [place] [address]   Salicylate level     Status: Abnormal   Collection Time: 11/14/22  3:41 PM  Result Value Ref Range   Salicylate Lvl <7.0 (L) 7.0 - 30.0 mg/dL    Comment: Performed at [facility], [address], [place], [place] [address]    Current Facility-Administered Medications  Medication Dose Route Frequency Provider Last Rate Last Admin   divalproex (DEPAKOTE ER) 24 hr tablet 1,000 mg  1,000 mg Oral QHS Sharyn Creamer, MD       hydrOXYzine (ATARAX) tablet 50 mg  50 mg Oral TID PRN Sharyn Creamer, MD       QUEtiapine (SEROQUEL) tablet 200 mg  200 mg Oral QHS Sharyn Creamer, MD  Current Outpatient Medications  Medication Sig Dispense Refill   divalproex (DEPAKOTE ER) 500 MG 24 hr tablet Take 2 tablets (1,000 mg total) by mouth at bedtime for 14  days. 28 tablet 0   hydrOXYzine (ATARAX) 50 MG tablet Take 1 tablet (50 mg total) by mouth 3 (three) times daily as needed for anxiety. 14 tablet 0   lidocaine (LIDODERM) 5 % Place 1 patch onto the skin daily as needed. Remove & Discard patch within 12 hours or as directed by MD     nicotine polacrilex (NICORETTE) 2 MG gum Take 1 each (2 mg total) by mouth as needed for smoking cessation. 100 tablet 0   paliperidone (INVEGA SUSTENNA) 234 MG/1.5ML injection Inject 234 mg into the muscle every 28 (twenty-eight) days. 1.8 mL 1   prazosin (MINIPRESS) 1 MG capsule Take 1 capsule (1 mg total) by mouth at bedtime. 14 capsule 0   QUEtiapine (SEROQUEL) 200 MG tablet Take 1 tablet (200 mg total) by mouth at bedtime for 14 days. 14 tablet 0   traZODone (DESYREL) 50 MG tablet Take 1 tablet (50 mg total) by mouth at bedtime as needed for up to 14 days for sleep. 14 tablet 0    Musculoskeletal: Strength & Muscle Tone: within normal limits Gait & Station: normal Patient leans: N/A            Psychiatric Specialty Exam:  Presentation  General Appearance:  Appropriate for Environment  Eye Contact: Minimal  Speech: Clear and Coherent  Speech Volume: Increased  Handedness: Right   Mood and Affect  Mood: Anxious  Affect: Congruent; Full Range   Thought Process  Thought Processes: Goal Directed  Descriptions of Associations:Loose  Orientation:Other (comment) (Unable to determine)  Thought Content:Illogical  History of Schizophrenia/Schizoaffective disorder:Yes  Duration of Psychotic Symptoms:N/A  Hallucinations:Hallucinations: Other (comment) (Unable to determine)  Ideas of Reference:Paranoia  Suicidal Thoughts:Suicidal Thoughts: -- (Unable to determine)  Homicidal Thoughts:Homicidal Thoughts: -- (Unable to determine)   Sensorium  Memory: -- (Unable to determine)  Judgment: Impaired  Insight: Lacking   Executive Functions   Concentration: Poor  Attention Span: Poor  Recall: Other (comment) (Unable to determine)  Fund of Knowledge: Other (comment) (Unable to determine)  Language: Good   Psychomotor Activity  Psychomotor Activity:Psychomotor Activity: Mannerisms   Assets  Assets: Desire for Improvement   Sleep  Sleep:Sleep: -- (Unable to determine)   Physical Exam: Physical Exam ROS Blood pressure 110/75, pulse 80, temperature 98 F (36.7 C), temperature source Oral, resp. rate 16, SpO2 98%. There is no height or weight on file to calculate BMI.  Treatment Plan Summary: Daily contact with patient to assess and evaluate symptoms and progress in treatment Continue Depakote ER 1000 mg at bedtime, hydroxyzine 50 mg 3 times daily as needed for anxiety, and quetiapine 200 mg at bedtime. Disposition: Recommend psychiatric Inpatient admission when medically cleared.  Mcneil Sober, NP 11/14/2022 4:31 PM

## 2022-11-15 DIAGNOSIS — F17213 Nicotine dependence, cigarettes, with withdrawal: Secondary | ICD-10-CM | POA: Diagnosis not present

## 2022-11-15 DIAGNOSIS — Z638 Other specified problems related to primary support group: Secondary | ICD-10-CM | POA: Diagnosis not present

## 2022-11-15 DIAGNOSIS — F1290 Cannabis use, unspecified, uncomplicated: Secondary | ICD-10-CM | POA: Diagnosis not present

## 2022-11-15 DIAGNOSIS — E876 Hypokalemia: Secondary | ICD-10-CM | POA: Diagnosis not present

## 2022-11-15 DIAGNOSIS — E559 Vitamin D deficiency, unspecified: Secondary | ICD-10-CM | POA: Diagnosis not present

## 2022-11-15 DIAGNOSIS — F17210 Nicotine dependence, cigarettes, uncomplicated: Secondary | ICD-10-CM | POA: Diagnosis not present

## 2022-11-15 DIAGNOSIS — Z62811 Personal history of psychological abuse in childhood: Secondary | ICD-10-CM | POA: Diagnosis not present

## 2022-11-15 DIAGNOSIS — F29 Unspecified psychosis not due to a substance or known physiological condition: Secondary | ICD-10-CM | POA: Diagnosis not present

## 2022-11-15 DIAGNOSIS — F200 Paranoid schizophrenia: Secondary | ICD-10-CM | POA: Diagnosis not present

## 2022-11-15 DIAGNOSIS — I10 Essential (primary) hypertension: Secondary | ICD-10-CM | POA: Diagnosis not present

## 2022-11-15 DIAGNOSIS — F23 Brief psychotic disorder: Secondary | ICD-10-CM | POA: Diagnosis not present

## 2022-11-15 DIAGNOSIS — Z62810 Personal history of physical and sexual abuse in childhood: Secondary | ICD-10-CM | POA: Diagnosis not present

## 2022-11-15 DIAGNOSIS — J45998 Other asthma: Secondary | ICD-10-CM | POA: Diagnosis not present

## 2022-11-15 DIAGNOSIS — Z8659 Personal history of other mental and behavioral disorders: Secondary | ICD-10-CM | POA: Diagnosis not present

## 2022-11-15 DIAGNOSIS — J45909 Unspecified asthma, uncomplicated: Secondary | ICD-10-CM | POA: Diagnosis not present

## 2022-11-15 DIAGNOSIS — Z62812 Personal history of neglect in childhood: Secondary | ICD-10-CM | POA: Diagnosis not present

## 2022-11-15 MED ORDER — DIPHENHYDRAMINE HCL 50 MG/ML IJ SOLN: 50.0000 mg | Freq: Once | INTRAMUSCULAR | Status: DC

## 2022-11-15 MED ORDER — MIDAZOLAM HCL (PF) 10 MG/2ML IJ SOLN
4.0000 mg | Freq: Once | INTRAMUSCULAR | Status: AC
  Administered 2022-11-15: 4 mg via INTRAMUSCULAR
  Filled 2022-11-15: qty 2

## 2022-11-15 MED ORDER — MIDAZOLAM HCL 2 MG/2ML IJ SOLN: 4.0000 mg | Freq: Once | INTRAMUSCULAR | Status: DC

## 2022-11-15 MED ORDER — MIDAZOLAM HCL 2 MG/2ML IJ SOLN
4.0000 mg | Freq: Once | INTRAMUSCULAR | Status: DC
  Filled 2022-11-15: qty 4

## 2022-11-15 NOTE — ED Notes (Signed)
EMTALA Reviewed by this RN.  

## 2022-11-15 NOTE — ED Provider Notes (Addendum)
Emergency Medicine Observation Re-evaluation Note  Kenneth Jensen is a 32 y.o. male, seen on rounds today.  Pt initially presented to the ED for complaints of Psychiatric Evaluation  Currently, the patient is is no acute distress. Denies any concerns at this time.  Physical Exam  Blood pressure 131/68, pulse 90, temperature 98.3 F (36.8 C), resp. rate 17, SpO2 97%.  Physical Exam: General: Yelling in the hallway.  Patient states that he wants to go home.  Informed the patient that he had IVC and is waiting for inpatient hospitalization.  Patient became more agitated, unable to verbally de-escalate.  Asking for an injection.      ED Course / MDM     I have reviewed the labs performed to date as well as medications administered while in observation.  Recent changes in the last 24 hours include: No acute events overnight.  Received IM Versed -do not feel that the patient needs to be chemically restrained, patient asking for IM injection for anxiety.  Did not want to take any oral medications.  Plan   Current plan: Patient awaiting psychiatric disposition.  Transferred to inpatient psychiatric facility in stable condition Patient is under full IVC at this time.    Corena Herter, MD 11/15/22 1122    Corena Herter, MD 11/15/22 1131    Corena Herter, MD 11/15/22 1537

## 2022-11-15 NOTE — ED Notes (Signed)
Patient has been accepted to [facility].  Patient assigned to 700. Accepting physician is Dr. Jaclynn Major.  Call report to [phone].  Representative was Ava.

## 2022-11-15 NOTE — ED Notes (Signed)
Patient willingly taking IM shot to help calm him. Refused pill and requested shot

## 2022-11-17 DIAGNOSIS — F23 Brief psychotic disorder: Secondary | ICD-10-CM | POA: Diagnosis not present

## 2022-11-18 DIAGNOSIS — F23 Brief psychotic disorder: Secondary | ICD-10-CM | POA: Diagnosis not present

## 2022-11-19 DIAGNOSIS — F23 Brief psychotic disorder: Secondary | ICD-10-CM | POA: Diagnosis not present

## 2022-11-20 DIAGNOSIS — F23 Brief psychotic disorder: Secondary | ICD-10-CM | POA: Diagnosis not present

## 2022-11-21 DIAGNOSIS — F23 Brief psychotic disorder: Secondary | ICD-10-CM | POA: Diagnosis not present

## 2022-11-22 DIAGNOSIS — F23 Brief psychotic disorder: Secondary | ICD-10-CM | POA: Diagnosis not present

## 2022-11-24 DIAGNOSIS — F23 Brief psychotic disorder: Secondary | ICD-10-CM | POA: Diagnosis not present

## 2022-11-25 DIAGNOSIS — F23 Brief psychotic disorder: Secondary | ICD-10-CM | POA: Diagnosis not present

## 2022-12-15 ENCOUNTER — Encounter: Payer: Self-pay | Admitting: Emergency Medicine

## 2022-12-15 ENCOUNTER — Other Ambulatory Visit: Payer: Self-pay

## 2022-12-15 ENCOUNTER — Emergency Department
Admission: EM | Admit: 2022-12-15 | Discharge: 2022-12-16 | Disposition: A | Discharge status: Home / Self Care | Payer: Medicare HMO | Attending: Emergency Medicine | Admitting: Emergency Medicine

## 2022-12-15 DIAGNOSIS — F1410 Cocaine abuse, uncomplicated: Secondary | ICD-10-CM | POA: Diagnosis present

## 2022-12-15 DIAGNOSIS — R440 Auditory hallucinations: Secondary | ICD-10-CM | POA: Diagnosis not present

## 2022-12-15 LAB — COMPREHENSIVE METABOLIC PANEL
ALT: 18 U/L (ref 0–44)
AST: 22 U/L (ref 15–41)
Albumin: 4 g/dL (ref 3.5–5.0)
Alkaline Phosphatase: 73 U/L (ref 38–126)
Anion gap: 10 (ref 5–15)
BUN: 10 mg/dL (ref 6–20)
CO2: 25 mmol/L (ref 22–32)
Calcium: 8.4 mg/dL — ABNORMAL LOW (ref 8.9–10.3)
Chloride: 99 mmol/L (ref 98–111)
Creatinine, Ser: 0.67 mg/dL (ref 0.61–1.24)
GFR, Estimated: >60 mL/min (ref >60)
Glucose, Bld: 103 mg/dL — ABNORMAL HIGH (ref 70–99)
Potassium: 3.6 mmol/L (ref 3.5–5.1)
Sodium: 134 mmol/L — ABNORMAL LOW (ref 135–145)
Total Bilirubin: 0.4 mg/dL (ref <1.2)
Total Protein: 7.8 g/dL (ref 6.5–8.1)

## 2022-12-15 LAB — URINE DRUG SCREEN, QUALITATIVE (ARMC ONLY)
Amphetamines, Ur Screen: NOT DETECTED
Barbiturates, Ur Screen: NOT DETECTED
Benzodiazepine, Ur Scrn: NOT DETECTED
Cannabinoid 50 Ng, Ur ~~LOC~~: POSITIVE — AB
Cocaine Metabolite,Ur ~~LOC~~: POSITIVE — AB
MDMA (Ecstasy)Ur Screen: NOT DETECTED
Methadone Scn, Ur: NOT DETECTED
Opiate, Ur Screen: NOT DETECTED
Phencyclidine (PCP) Ur S: NOT DETECTED
Tricyclic, Ur Screen: NOT DETECTED

## 2022-12-15 LAB — CBC
HCT: 38.1 % — ABNORMAL LOW (ref 39.0–52.0)
Hemoglobin: 13 g/dL (ref 13.0–17.0)
MCH: 28.9 pg (ref 26.0–34.0)
MCHC: 34.1 g/dL (ref 30.0–36.0)
MCV: 84.7 fL (ref 80.0–100.0)
Platelets: 461 10*3/uL — ABNORMAL HIGH (ref 150–400)
RBC: 4.5 MIL/uL (ref 4.22–5.81)
RDW: 13.3 % (ref 11.5–15.5)
WBC: 12.7 10*3/uL — ABNORMAL HIGH (ref 4.0–10.5)
nRBC: 0 % (ref 0.0–0.2)

## 2022-12-15 LAB — SALICYLATE LEVEL: Salicylate Lvl: <7 mg/dL — ABNORMAL LOW (ref 7.0–30.0)

## 2022-12-15 LAB — ETHANOL: Alcohol, Ethyl (B): <10 mg/dL (ref <10)

## 2022-12-15 LAB — ACETAMINOPHEN LEVEL: Acetaminophen (Tylenol), Serum: <10 ug/mL — ABNORMAL LOW (ref 10–30)

## 2022-12-15 NOTE — ED Provider Notes (Signed)
[facility] Provider Note    Event Date/Time   First MD Initiated Contact with Patient 12/15/22 2318     (approximate)   History   Mental Health Problem  Level 5 caveat:  history/ROS limited by active psychosis / mental illness / altered mental status   HPI Kenneth Jensen is a 32 y.o. male well-known to the emergency department for frequent behavioral medicine visits.  He presents voluntarily tonight but accompanied by Patent examiner.  He was acting erratic according to the officer.  The patient states that he is hearing voices "that are spiritual" that are "telling me things about women".  He also said that the spiritual voices are messing with "my butt hole and making it feel weird".  He denies any other medical complaints or concerns.  He cannot describe the feeling in his anus or rectum, just that it feels weird.  He denies thoughts of suicide or homicide.  He says he is not taking his psychiatric medication.     Physical Exam   Triage Vital Signs: ED Triage Vitals  Encounter Vitals Group     BP 12/15/22 2105 (!) 141/83     Systolic BP Percentile --      Diastolic BP Percentile --      Pulse Rate 12/15/22 2105 88     Resp 12/15/22 2105 18     Temp 12/15/22 2105 98.4 F (36.9 C)     Temp Source 12/15/22 2105 Oral     SpO2 12/15/22 2105 98 %     Weight 12/15/22 2105 97 kg (213 lb 13.5 oz)     Height --      Head Circumference --      Peak Flow --      Pain Score 12/15/22 2104 0     Pain Loc --      Pain Education --      Exclude from Growth Chart --     Most recent vital signs: Vitals:   12/15/22 2105  BP: (!) 141/83  Pulse: 88  Resp: 18  Temp: 98.4 F (36.9 C)  SpO2: 98%    General: Awake, no distress.  CV:  Good peripheral perfusion.  Resp:  Normal effort. Speaking easily and comfortably, no accessory muscle usage nor intercostal retractions.   Abd:  No distention.  Psych:  No SI or HI, patient is expressing some bizarre  thoughts and ideas, specifically hearing "spiritual voices" that he thinks are "messing with my butt hole".  Admits to not taking psychiatric medication. Other:  Emergency planning/management officer was present as chaperone when I examined the patient's anus.  He has no external abnormalities, no sign of trauma, No sign of abscess or infection.  No tenderness, no bogginess nor induration.  No evidence of foreign body.   ED Results / Procedures / Treatments   Labs (all labs ordered are listed, but only abnormal results are displayed) Labs Reviewed  COMPREHENSIVE METABOLIC PANEL - Abnormal; Notable for the following components:      Result Value   Sodium 134 (*)    Glucose, Bld 103 (*)    Calcium 8.4 (*)    All other components within normal limits  CBC - Abnormal; Notable for the following components:   WBC 12.7 (*)    HCT 38.1 (*)    Platelets 461 (*)    All other components within normal limits  URINE DRUG SCREEN, QUALITATIVE ([facility] ONLY) - Abnormal; Notable for the following components:  Cocaine Metabolite,Ur [place] POSITIVE (*)    Cannabinoid 50 Ng, Ur [place] POSITIVE (*)    All other components within normal limits  ACETAMINOPHEN LEVEL - Abnormal; Notable for the following components:   Acetaminophen (Tylenol), Serum <10 (*)    All other components within normal limits  SALICYLATE LEVEL - Abnormal; Notable for the following components:   Salicylate Lvl <7.0 (*)    All other components within normal limits  ETHANOL      PROCEDURES:  Critical Care performed: No  Procedures    IMPRESSION / MDM / ASSESSMENT AND PLAN / ED COURSE  I reviewed the triage vital signs and the nursing notes.                              Differential diagnosis includes, but is not limited to, schizophrenia, substance-induced mood disorder, malingering.  Patient's presentation is most consistent with acute presentation with potential threat to life or bodily function.  Labs/studies ordered: As per protocol, I ordered  the following labs as part of the patient's medical and psychiatric evaluation:  CBC, CMP, ethanol level, acetaminophen level, salicylate level, urine drug screen  Interventions/Medications given:  Medications - No data to display  (Note:  hospital course my include additional interventions and/or labs/studies not listed above.)   Normal physical exam, stable vital signs.  Labs notable for cocaine and cannabinol aids, otherwise generally reassuring.  Well-known to the behavioral medicine service with a recent prior admission about a month ago.  Patient does not require involuntary commitment at this time but would benefit from psychiatric evaluation.  He is medically cleared for psych recommendations and disposition.    The patient has been placed in psychiatric observation due to the need to provide a safe environment for the patient while obtaining psychiatric consultation and evaluation, as well as ongoing medical and medication management to treat the patient's condition.  The patient has not been placed under full IVC at this time.       FINAL CLINICAL IMPRESSION(S) / ED DIAGNOSES   Final diagnoses:  Auditory hallucinations     Rx / DC Orders   ED Discharge Orders     None        Note:  This document was prepared using Dragon voice recognition software and may include unintentional dictation errors.   [name], MD 12/15/22 [phone]

## 2022-12-15 NOTE — ED Notes (Addendum)
Belongings:  Land Black pants [employer] hospital socks 1 cell phone 1 cell phone charger 1 wallet 1 phone speaker 1 grey pair of boxers Black lanyard with set of keys attached  Patient dressed out into burgundy hospital scrubs, hospital provided underwear, and socks

## 2022-12-15 NOTE — ED Triage Notes (Addendum)
Patient BIB  PD voluntarily, states he does not feel like his schizophrenia medication is working, is having trouble sleeping, and feels like "spiritual people are bothering him." Patient states this problem has been bothering him for year, but has been worse recently.

## 2022-12-16 DIAGNOSIS — F1410 Cocaine abuse, uncomplicated: Secondary | ICD-10-CM

## 2022-12-16 NOTE — ED Notes (Signed)
Hospital meal provided, pt tolerated w/o complaints.  Waste discarded appropriately.  

## 2022-12-16 NOTE — ED Provider Notes (Signed)
Notified by psychiatric NP the patient was evaluated and cleared for psychiatric discharge.  Briefly this is a 32 year old who presented voluntarily for erratic behavior.  Labs without critical derangements.  Patient discharged in stable condition with plans for close outpatient follow-up later this afternoon.   Trinna Post, MD 12/16/22 1150

## 2022-12-16 NOTE — ED Notes (Signed)
Spoke to [employer] / ACT team @ [phone] Ext. [phone] and verified that patient has a home visit scheduled today, 12/16/2022 @ 1400.

## 2022-12-16 NOTE — Discharge Instructions (Addendum)
Keep your scheduled follow-up later this afternoon with your act team.  Return to the ER for new or worsening symptoms.

## 2022-12-16 NOTE — BH Assessment (Signed)
Comprehensive Clinical Assessment (CCA) Screening, Triage and Referral Note  12/16/2022 Kenneth Jensen [id number]  Kenneth Jensen, 32 year old male who presents to [facility] ED involuntarily for treatment. Per triage note, Patient BIB Kenneth Jensen, states he does not feel like his schizophrenia medication is working, is having trouble sleeping, and feels like "spiritual people are bothering him." Patient states this problem has been bothering him for year, but has been worse recently.   During TTS assessment pt presents alert and oriented x 4, restless but cooperative, and mood-congruent with affect. The pt does not appear to be responding to internal or external stimuli. Neither is the pt presenting with any delusional thinking. Pt verified the information provided to triage RN.   Pt identifies his main complaint to be that he is ready to leave. Patient reports he went to a party over the weekend and was a "little bit high." Patient admits to using cocaine and marijuana at least 1-2  times a day. Patient reports he is compliant with his medications and has been followed by Kenneth for several years. 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 Patient reports poor sleep and eating habits. Pt denies current SI/HI/AH/VH. Pt contracts for safety.    Per Kenneth Pih, NP, pt shows no evidence of imminent risk to self or others at present and does not meet criteria for psychiatric inpatient admission.   Chief Complaint:  Chief Complaint  Patient presents with   Mental Health Problem   Visit Diagnosis: Cocaine abuse  Patient Reported Information How did you hear about [place]? -- Kenneth Jensen)  What Is the Reason for Your Visit/Call Today? Patient brought to ED due to psychosis.  How Long Has This Been Causing You Problems? > than 6 months  What Do You Feel Would Help You the Most Today? -- (Assessment only)   Have You Recently Had Any Thoughts About  Hurting Yourself? No  Are You Planning to Commit Suicide/Harm Yourself At This time? No   Have you Recently Had Thoughts About Hurting Someone Kenneth Jensen? No  Are You Planning to Harm Someone at This Time? No  Explanation: n/a   Have You Used Any Alcohol or Drugs in the Past 24 Hours? Yes  How Long Ago Did You Use Drugs or Alcohol? No data recorded What Did You Use and How Much? Cocaine and marijuana   Do You Currently Have a Therapist/Psychiatrist? Yes  Name of Therapist/Psychiatrist: [facility]   Have You Been Recently Discharged From Any Office Practice or Programs? No  Explanation of Discharge From Practice/Program: n/a    CCA Screening Triage Referral Assessment Type of Contact: Face-to-Face  Telemedicine Service Delivery:   Is this Initial or Reassessment?   Date Telepsych consult ordered in [facility]:    Time Telepsych consult ordered in [facility]:    Location of Assessment: [facility] ED  Provider Location: [facility] ED    Collateral Involvement: None provided   Does Patient Have a Court Appointed Legal Guardian? No data recorded Name and Contact of Legal Guardian: No data recorded If Minor and Not Living with Parent(s), Who has Custody? n/a  Is CPS involved or ever been involved? Never  Is APS involved or ever been involved? Never   Patient Determined To Be At Risk for Harm To Self or Others Based on Review of Patient Reported Information or Presenting Complaint? No  Method: No Plan  Availability of Means: No access or NA  Intent: Vague intent or NA  Notification Required: No need or identified person  Additional Information for Danger to Others Potential: Active psychosis  Additional Comments for Danger to Others Potential: n/a  Are There Guns or Other Weapons in Your Home? No  Types of Guns/Weapons: n/a  Are These Weapons Safely Secured?                            No  Who Could Verify You Are Able To Have These Secured: n/a  Do You Have any Outstanding  Charges, Pending Court Dates, Parole/Probation? None reported  Contacted To Inform of Risk of Harm To Self or Others: Other: Comment   Does Patient Present under Involuntary Commitment? No    [place] of Residence: [place]   Patient Currently Receiving the Following Services: Individual Therapy; Medication Management   Determination of Need: Emergent (2 hours)   Options For Referral: ED Visit; Medication Management; Therapeutic Triage Services   Discharge Disposition:     Clerance Lav, Counselor, LCAS-A

## 2022-12-16 NOTE — ED Notes (Signed)
Pt continues to voice that he is a voluntary patient and that "I'm voluntary, I'm ready to leave I should be able to leave now" ED provider

## 2022-12-16 NOTE — ED Notes (Signed)
Pt. Alert and oriented, warm and dry, in no distress. Pt. Denies SI, HI, and AVH. Pt states he just needs somewhere to sleep, and he hasn't been doing any drugs this week. Writer asked about being positive for cocaine, patient states that is from last week when he was doing it real hard. Pt. Encouraged to let nursing staff know of any concerns or needs.  ENVIRONMENTAL ASSESSMENT Potentially harmful objects out of patient reach: Yes.   Personal belongings secured: Yes.   Patient dressed in hospital provided attire only: Yes.   Plastic bags out of patient reach: Yes.   Patient care equipment (cords, cables, call bells, lines, and drains) shortened, removed, or accounted for: Yes.   Equipment and supplies removed from bottom of stretcher: Yes.   Potentially toxic materials out of patient reach: Yes.   Sharps container removed or out of patient reach: Yes.

## 2022-12-16 NOTE — ED Notes (Signed)
Pt taking shower. Pt was given hygiene items and the following, 1 clean top, 1 clean bottom, with 1 pair of disposable underwear.  Pt changed out into clean clothing.  Staff disposed of all shower supplies.   

## 2022-12-16 NOTE — ED Notes (Signed)
Pt woke up when breakfast was being provided by staff, he stated "I'm voluntary, I slept, I'm ready to leave now.  Call the Dr and let them know I'm ready"

## 2022-12-16 NOTE — ED Notes (Signed)
Pt is A/Ox 4, Mr Kenneth Jensen declines any SI/HI stated that he is not currently having A/V hallucinations.  Discharge instructions reviewed with Mr Kenneth Jensen, he verbalized understanding.  All Belongings accounted for and returned to PT.  Pt left ambulatory via self care.  Act Team notified at time of as he has an appt this afternoon at 1400.

## 2022-12-16 NOTE — Consult Note (Signed)
[facility] Face-to-Face Psychiatry Consult   Reason for Consult:  hearing "spiritual" voice, hx of schizophrenia, not taking meds Referring Physician:  Dr. [name] Patient Identification: Kenneth Jensen MRN:  [id number] Principal Diagnosis: Cocaine abuse (HCC) Diagnosis:  Principal Problem:   Cocaine abuse (HCC)  Total Time spent with patient, including time spent reviewing pt's chart: 45 minutes  Subjective:  "yesterday a little bit high"  HPI:   Pt chart reviewed and assessed face to face. When asked reason for presenting to the emergency department, reports "yesterday a little bit high". Reports use of crack/cocaine and marijuana 2 to 3 times a week, 1 to 2 times a day. Reports last use was day before yesterday at a party. He denies use of alcohol, methamphetamines, other substances. He reports he is followed by [facility] team for "some years now". Endorses poor appetite and sleep. Denies suicidal, homicidal ideation. Denies auditory visual hallucinations or paranoia. He reports feeling better now and is ready to discharge.   RN called [facility]/ACT team at [phone] ext [phone] and pt is scheduled for a home visit today at 2:00PM.  Past Psychiatric History: Paranoid schizophrenia, auditory hallucinations, cannabis use disorder, tobacco use disorder, cocaine use disorder, acute psychosis  Risk to Self: Denies suicidal ideations Risk to Others: Denies homicidal ideations Prior Inpatient Therapy: Yes Prior Outpatient Therapy: Yes  Past Medical History:  Past Medical History:  Diagnosis Date   Asthma    Bipolar 1 disorder (HCC)    Depression    Schizophrenia (HCC)    Schizotaxia    History reviewed. No pertinent surgical history. Family History: History reviewed. No pertinent family history.  Social History:  Social History   Substance and Sexual Activity  Alcohol Use Yes   Comment: social     Social History   Substance and Sexual Activity  Drug Use Yes   Types: Marijuana,  Cocaine   Comment: last marijuana use 09/02/22    Social History   Socioeconomic History   Marital status: Single    Spouse name: Not on file   Number of children: Not on file   Years of education: Not on file   Highest education level: Not on file  Occupational History   Not on file  Tobacco Use   Smoking status: Every Day    Current packs/day: 0.50    Types: Cigarettes   Smokeless tobacco: Never  Substance and Sexual Activity   Alcohol use: Yes    Comment: social   Drug use: Yes    Types: Marijuana, Cocaine    Comment: last marijuana use 09/02/22   Sexual activity: Not Currently  Other Topics Concern   Not on file  Social History Narrative   ** Merged History Encounter **       Social Determinants of Health   Financial Resource Strain: Unknown (01/30/2018)   Received from [facility], [facility]   Overall Financial Resource Strain (CARDIA)    Difficulty of Paying Living Expenses: Patient declined  Food Insecurity: Food Insecurity Present (09/04/2022)   Hunger Vital Sign    Worried About Running Out of Food in the Last Year: Sometimes true    Ran Out of Food in the Last Year: Sometimes true  Transportation Needs: Unmet Transportation Needs (09/04/2022)   PRAPARE - Transportation    Lack of Transportation (Medical): Yes    Lack of Transportation (Non-Medical): Yes  Physical Activity: Unknown (01/30/2018)   Received from [facility], [facility]  System   Exercise Vital Sign    Days of Exercise per Week: Patient declined    Minutes of Exercise per Session: Patient declined  Stress: Unknown (01/30/2018)   Received from [facility], [facility]   [facility] - Occupational Stress Questionnaire    Feeling of Stress : Patient declined  Social Connections: Unknown (01/30/2018)   Received from [facility], [facility]  [facility]   Social Connection and Isolation Panel [NHANES]    Frequency of Communication with Friends and Family: Patient declined    Frequency of Social Gatherings with Friends and Family: Patient declined    Attends Religious Services: Patient declined    Active Member of Clubs or Organizations: Patient declined    Attends Banker Meetings: Patient declined    Marital Status: Patient declined   Allergies:   Allergies  Allergen Reactions   Penicillins Anaphylaxis   Amoxicillin Swelling   Haldol [Haloperidol]     Pt states "I went crazy"   Percocet [Oxycodone-Acetaminophen]    Vicodin [Hydrocodone-Acetaminophen] Itching   Vicodin [Hydrocodone-Acetaminophen]     Labs:  Results for orders placed or performed during the hospital encounter of 12/15/22 (from the past 48 hour(s))  Comprehensive metabolic panel     Status: Abnormal   Collection Time: 12/15/22  9:08 PM  Result Value Ref Range   Sodium 134 (L) 135 - 145 mmol/L   Potassium 3.6 3.5 - 5.1 mmol/L   Chloride 99 98 - 111 mmol/L   CO2 25 22 - 32 mmol/L   Glucose, Bld 103 (H) 70 - 99 mg/dL    Comment: Glucose reference range applies only to samples taken after fasting for at least 8 hours.   BUN 10 6 - 20 mg/dL   Creatinine, Ser 1.19 0.61 - 1.24 mg/dL   Calcium 8.4 (L) 8.9 - 10.3 mg/dL   Total Protein 7.8 6.5 - 8.1 g/dL   Albumin 4.0 3.5 - 5.0 g/dL   AST 22 15 - 41 U/L   ALT 18 0 - 44 U/L   Alkaline Phosphatase 73 38 - 126 U/L   Total Bilirubin 0.4 <1.2 mg/dL   GFR, Estimated >14 >78 mL/min    Comment: (NOTE) Calculated using the CKD-EPI Creatinine Equation (2021)    Anion gap 10 5 - 15    Comment: Performed at [facility], [address] [place], [place] [address]  cbc     Status: Abnormal   Collection Time: 12/15/22  9:08 PM  Result Value Ref Range   WBC 12.7 (H) 4.0 - 10.5 K/uL   RBC 4.50 4.22 - 5.81 MIL/uL   Hemoglobin 13.0 13.0 - 17.0 g/dL   HCT 13.0 (L) 86.5 - 78.4 %   MCV 84.7 80.0 -  100.0 fL   MCH 28.9 26.0 - 34.0 pg   MCHC 34.1 30.0 - 36.0 g/dL   RDW 69.6 29.5 - 28.4 %   Platelets 461 (H) 150 - 400 K/uL   nRBC 0.0 0.0 - 0.2 %    Comment: Performed at [facility], [address] [place], [place] [address]  Urine Drug Screen, Qualitative     Status: Abnormal   Collection Time: 12/15/22  9:08 PM  Result Value Ref Range   Tricyclic, Ur Screen NONE DETECTED NONE DETECTED   Amphetamines, Ur Screen NONE DETECTED NONE DETECTED   MDMA (Ecstasy)Ur Screen NONE DETECTED NONE DETECTED   Cocaine Metabolite,Ur [place] POSITIVE (A) NONE  DETECTED   Opiate, Ur Screen NONE DETECTED NONE DETECTED   Phencyclidine (PCP) Ur S NONE DETECTED NONE DETECTED   Cannabinoid 50 Ng, Ur [place] POSITIVE (A) NONE DETECTED   Barbiturates, Ur Screen NONE DETECTED NONE DETECTED   Benzodiazepine, Ur Scrn NONE DETECTED NONE DETECTED   Methadone Scn, Ur NONE DETECTED NONE DETECTED    Comment: (NOTE) Tricyclics + metabolites, urine    Cutoff 1000 ng/mL Amphetamines + metabolites, urine  Cutoff 1000 ng/mL MDMA (Ecstasy), urine              Cutoff 500 ng/mL Cocaine Metabolite, urine          Cutoff 300 ng/mL Opiate + metabolites, urine        Cutoff 300 ng/mL Phencyclidine (PCP), urine         Cutoff 25 ng/mL Cannabinoid, urine                 Cutoff 50 ng/mL Barbiturates + metabolites, urine  Cutoff 200 ng/mL Benzodiazepine, urine              Cutoff 200 ng/mL Methadone, urine                   Cutoff 300 ng/mL  The urine drug screen provides only a preliminary, unconfirmed analytical test result and should not be used for non-medical purposes. Clinical consideration and professional judgment should be applied to any positive drug screen result due to possible interfering substances. A more specific alternate chemical method must be used in order to obtain a confirmed analytical result. Gas chromatography / mass spectrometry (GC/MS) is the preferred confirm atory method. Performed at [facility], [address], [place], [place] [address]   Acetaminophen level     Status: Abnormal   Collection Time: 12/15/22  9:08 PM  Result Value Ref Range   Acetaminophen (Tylenol), Serum <10 (L) 10 - 30 ug/mL    Comment: (NOTE) Therapeutic concentrations vary significantly. A range of 10-30 ug/mL  may be an effective concentration for many patients. However, some  are best treated at concentrations outside of this range. Acetaminophen concentrations >150 ug/mL at 4 hours after ingestion  and >50 ug/mL at 12 hours after ingestion are often associated with  toxic reactions.  Performed at [facility], [address] [place], [place] [address]   Salicylate level     Status: Abnormal   Collection Time: 12/15/22  9:08 PM  Result Value Ref Range   Salicylate Lvl <7.0 (L) 7.0 - 30.0 mg/dL    Comment: Performed at [facility], [address] [place], [place] [address]  Ethanol     Status: None   Collection Time: 12/15/22  9:08 PM  Result Value Ref Range   Alcohol, Ethyl (B) <10 <10 mg/dL    Comment: (NOTE) Lowest detectable limit for serum alcohol is 10 mg/dL.  For medical purposes only. Performed at [facility], [address] [place], [place] [address]     No current facility-administered medications for this encounter.   Current Outpatient Medications  Medication Sig Dispense Refill   divalproex (DEPAKOTE ER) 500 MG 24 hr tablet Take 2 tablets (1,000 mg total) by mouth at bedtime for 14 days. 28 tablet 0   haloperidol (HALDOL) 10 MG tablet Take 10 mg by mouth 2 (two) times daily.     hydrOXYzine (ATARAX) 50 MG tablet Take 1 tablet (50 mg total) by mouth 3 (three) times daily as needed for anxiety. 14  tablet 0   lidocaine (LIDODERM) 5 % Place 1 patch onto the skin daily as needed. Remove & Discard patch within 12 hours or as directed by MD     nicotine polacrilex (NICORETTE) 2 MG gum Take 1 each (2 mg total) by mouth as needed for  smoking cessation. 100 tablet 0   paliperidone (INVEGA SUSTENNA) 234 MG/1.5ML injection Inject 234 mg into the muscle every 28 (twenty-eight) days. 1.8 mL 1   paliperidone (INVEGA) 3 MG 24 hr tablet Take 3 mg by mouth at bedtime.     prazosin (MINIPRESS) 1 MG capsule Take 1 capsule (1 mg total) by mouth at bedtime. 14 capsule 0   QUEtiapine (SEROQUEL) 200 MG tablet Take 1 tablet (200 mg total) by mouth at bedtime for 14 days. 14 tablet 0   traZODone (DESYREL) 50 MG tablet Take 1 tablet (50 mg total) by mouth at bedtime as needed for up to 14 days for sleep. 14 tablet 0    Musculoskeletal: Strength & Muscle Tone: within normal limits Gait & Station: normal Patient leans: N/A  Psychiatric Specialty Exam:  Presentation  General Appearance:  Disheveled  Eye Contact: Minimal  Speech: Clear and Coherent  Speech Volume: Decreased  Handedness: Right   Mood and Affect  Mood: Euthymic  Affect: Flat   Thought Process  Thought Processes: Coherent  Descriptions of Associations:Intact  Orientation:Full (Time, Place and Person)  Thought Content:Logical  History of Schizophrenia/Schizoaffective disorder:Yes  Duration of Psychotic Symptoms:N/A  Hallucinations:Hallucinations: None  Ideas of Reference:None  Suicidal Thoughts:Suicidal Thoughts: No  Homicidal Thoughts:Homicidal Thoughts: No   Sensorium  Memory: Immediate Fair  Judgment: Intact  Insight: Shallow   Executive Functions  Concentration: Fair  Attention Span: Fair  Recall: Fair  Fund of Knowledge: Fair  Language: Fair   Psychomotor Activity  Psychomotor Activity: Psychomotor Activity: Decreased   Assets  Assets: Communication Skills; Desire for Improvement; Financial Resources/Insurance; Housing; Leisure Time; Resilience   Sleep  Sleep: Sleep: Poor   Physical Exam: Physical Exam Constitutional:      General: He is not in acute distress.    Appearance: He is not  ill-appearing, toxic-appearing or diaphoretic.  Eyes:     General: No scleral icterus. Cardiovascular:     Rate and Rhythm: Normal rate.  Pulmonary:     Effort: Pulmonary effort is normal. No respiratory distress.  Neurological:     Mental Status: He is alert and oriented to person, place, and time.  Psychiatric:        Attention and Perception: Attention and perception normal.        Mood and Affect: Mood normal. Affect is flat.        Speech: Speech normal.        Behavior: Behavior normal. Behavior is cooperative.        Thought Content: Thought content normal.        Cognition and Memory: Cognition and memory normal.        Judgment: Judgment normal.    Review of Systems  Constitutional:  Negative for fever.  Respiratory:  Negative for shortness of breath.   Cardiovascular:  Negative for chest pain.  Psychiatric/Behavioral:  Positive for substance abuse. The patient has insomnia.    Blood pressure 130/72, pulse 89, temperature 98.6 F (37 C), temperature source Oral, resp. rate 17, weight 97 kg, SpO2 98%. Body mass index is 34.52 kg/m.  Treatment Plan Summary:  32 y/o male w/ history of paranoid schizophrenia, auditory hallucinations, cannabis use disorder, tobacco  use disorder, cocaine use disorder, acute psychosis admitted to [facility] brought in by [facility] PD voluntarily w/ c/o he does not feel like his schizophrenia medication is working, is having trouble sleeping, and feels like "spiritual people are bothering him". On assessment endorses use of crack/cocaine and marijuana day before yesterday at a party. UDS is positive for cocaine metabolite and cannabinoid. He denies suicidal, homicidal ideations. He denies auditory visual hallucinations or paranoia. No evidence of agitation, aggression, distractibility or internal preoccupation. No paranoia or delusions elicited during conversation. Pt is connected with [facility] ACT team who will see him today at [facility].  Disposition: No  evidence of imminent risk to self or others at present.   Patient does not meet criteria for psychiatric inpatient admission.  Lauree Chandler, NP 12/16/2022 10:57 AM

## 2023-01-09 ENCOUNTER — Other Ambulatory Visit: Payer: Self-pay

## 2023-01-09 ENCOUNTER — Emergency Department
Admission: EM | Admit: 2023-01-09 | Discharge: 2023-01-10 | Disposition: A | Discharge status: Home / Self Care | Payer: Medicare HMO | Attending: Emergency Medicine | Admitting: Emergency Medicine

## 2023-01-09 DIAGNOSIS — F419 Anxiety disorder, unspecified: Secondary | ICD-10-CM | POA: Diagnosis present

## 2023-01-09 DIAGNOSIS — F200 Paranoid schizophrenia: Secondary | ICD-10-CM | POA: Insufficient documentation

## 2023-01-09 LAB — COMPREHENSIVE METABOLIC PANEL
ALT: 19 U/L (ref 0–44)
AST: 30 U/L (ref 15–41)
Albumin: 3.8 g/dL (ref 3.5–5.0)
Alkaline Phosphatase: 67 U/L (ref 38–126)
Anion gap: 16 — ABNORMAL HIGH (ref 5–15)
BUN: 7 mg/dL (ref 6–20)
CO2: 19 mmol/L — ABNORMAL LOW (ref 22–32)
Calcium: 8.4 mg/dL — ABNORMAL LOW (ref 8.9–10.3)
Chloride: 101 mmol/L (ref 98–111)
Creatinine, Ser: 0.97 mg/dL (ref 0.61–1.24)
GFR, Estimated: >60 mL/min (ref >60)
Glucose, Bld: 105 mg/dL — ABNORMAL HIGH (ref 70–99)
Potassium: 3.4 mmol/L — ABNORMAL LOW (ref 3.5–5.1)
Sodium: 136 mmol/L (ref 135–145)
Total Bilirubin: 0.4 mg/dL (ref 0.0–1.2)
Total Protein: 6.8 g/dL (ref 6.5–8.1)

## 2023-01-09 LAB — ETHANOL: Alcohol, Ethyl (B): <10 mg/dL (ref <10)

## 2023-01-09 LAB — URINE DRUG SCREEN, QUALITATIVE (ARMC ONLY)
Amphetamines, Ur Screen: NOT DETECTED
Barbiturates, Ur Screen: NOT DETECTED
Benzodiazepine, Ur Scrn: NOT DETECTED
Cannabinoid 50 Ng, Ur ~~LOC~~: POSITIVE — AB
Cocaine Metabolite,Ur ~~LOC~~: NOT DETECTED
MDMA (Ecstasy)Ur Screen: NOT DETECTED
Methadone Scn, Ur: NOT DETECTED
Opiate, Ur Screen: NOT DETECTED
Phencyclidine (PCP) Ur S: NOT DETECTED
Tricyclic, Ur Screen: NOT DETECTED

## 2023-01-09 LAB — CBC
HCT: 40.2 % (ref 39.0–52.0)
Hemoglobin: 13.1 g/dL (ref 13.0–17.0)
MCH: 29 pg (ref 26.0–34.0)
MCHC: 32.6 g/dL (ref 30.0–36.0)
MCV: 88.9 fL (ref 80.0–100.0)
Platelets: 321 10*3/uL (ref 150–400)
RBC: 4.52 MIL/uL (ref 4.22–5.81)
RDW: 13.5 % (ref 11.5–15.5)
WBC: 9.9 10*3/uL (ref 4.0–10.5)
nRBC: 0 % (ref 0.0–0.2)

## 2023-01-09 MED ORDER — NICOTINE POLACRILEX 2 MG MT GUM
2.0000 mg | CHEWING_GUM | OROMUCOSAL | Status: DC | PRN
Start: 2023-01-09 — End: 2023-01-10

## 2023-01-09 MED ORDER — PALIPERIDONE ER 3 MG PO TB24
3.0000 mg | ORAL_TABLET | Freq: Every day | ORAL | Status: DC
  Administered 2023-01-10: 3 mg via ORAL
  Filled 2023-01-09: qty 1

## 2023-01-09 MED ORDER — HALOPERIDOL 5 MG PO TABS
10.0000 mg | ORAL_TABLET | Freq: Two times a day (BID) | ORAL | Status: DC
  Administered 2023-01-10 (×2): 10 mg via ORAL
  Filled 2023-01-09 (×2): qty 2

## 2023-01-09 MED ORDER — POTASSIUM CHLORIDE CRYS ER 20 MEQ PO TBCR
40.0000 meq | EXTENDED_RELEASE_TABLET | Freq: Once | ORAL | Status: AC
  Administered 2023-01-10: 40 meq via ORAL
  Filled 2023-01-09: qty 2

## 2023-01-09 MED ORDER — PRAZOSIN HCL 1 MG PO CAPS
1.0000 mg | ORAL_CAPSULE | Freq: Every day | ORAL | Status: DC
  Administered 2023-01-10: 1 mg via ORAL
  Filled 2023-01-09: qty 1

## 2023-01-09 MED ORDER — DIVALPROEX SODIUM ER 500 MG PO TB24
1000.0000 mg | ORAL_TABLET | Freq: Every day | ORAL | Status: DC
Start: 2023-01-09 — End: 2023-01-10
  Administered 2023-01-10: 1000 mg via ORAL
  Filled 2023-01-09: qty 4

## 2023-01-09 MED ORDER — HYDROXYZINE HCL 25 MG PO TABS: 50.0000 mg | ORAL_TABLET | Freq: Three times a day (TID) | ORAL | Status: DC | PRN

## 2023-01-09 MED ORDER — TRAZODONE HCL 50 MG PO TABS: 50.0000 mg | ORAL_TABLET | Freq: Every evening | ORAL | Status: DC | PRN

## 2023-01-09 MED ORDER — QUETIAPINE FUMARATE 200 MG PO TABS
200.0000 mg | ORAL_TABLET | Freq: Every day | ORAL | Status: DC
Start: 2023-01-09 — End: 2023-01-10
  Administered 2023-01-10: 200 mg via ORAL
  Filled 2023-01-09: qty 1

## 2023-01-09 NOTE — ED Triage Notes (Addendum)
 Pt arrives with [facility], act team. Pt has recently gotten a new guardian and is anxious about his mother not taking care of him and what is going to happen. [facility] stst hat pt did take his anxiety medication and has is calm while in triage. Pt denies any SI/HI.

## 2023-01-09 NOTE — ED Provider Notes (Signed)
 [facility] Provider Note    Event Date/Time   First MD Initiated Contact with Patient 01/09/23 2221     (approximate)   History   Anxiety   HPI  Kenneth Jensen is a 33 y.o. male  here with anxiety. Pt has a long history of psych disease and is here frequently. Per report, pt called crissi line and was having bizarre thoughts. He gave them his pocket knife and said he may hurt himself. Pt trying to take too many meds. Concern for his safety per [facility] team.        Physical Exam   Triage Vital Signs: ED Triage Vitals  Encounter Vitals Group     BP 01/09/23 2053 (!) 137/92     Systolic BP Percentile --      Diastolic BP Percentile --      Pulse Rate 01/09/23 2053 79     Resp 01/09/23 2053 17     Temp 01/09/23 2053 98.4 F (36.9 C)     Temp Source 01/09/23 2053 Oral     SpO2 01/09/23 2053 95 %     Weight 01/09/23 2054 216 lb 0.8 oz (98 kg)     Height --      Head Circumference --      Peak Flow --      Pain Score 01/09/23 2054 0     Pain Loc --      Pain Education --      Exclude from Growth Chart --     Most recent vital signs: Vitals:   01/09/23 2053  BP: (!) 137/92  Pulse: 79  Resp: 17  Temp: 98.4 F (36.9 C)  SpO2: 95%     General: Awake, no distress.  CV:  Good peripheral perfusion.  Resp:  Normal work of breathing.  Abd:  No distention.  Other:  Withdrawn, anxious.   ED Results / Procedures / Treatments   Labs (all labs ordered are listed, but only abnormal results are displayed) Labs Reviewed  COMPREHENSIVE METABOLIC PANEL - Abnormal; Notable for the following components:      Result Value   Potassium 3.4 (*)    CO2 19 (*)    Glucose, Bld 105 (*)    Calcium 8.4 (*)    Anion gap 16 (*)    All other components within normal limits  URINE DRUG SCREEN, QUALITATIVE ([facility] ONLY) - Abnormal; Notable for the following components:   Cannabinoid 50 Ng, Ur [place] POSITIVE (*)    All other components within normal  limits  ETHANOL  CBC     EKG    RADIOLOGY    I also independently reviewed and agree with radiologist interpretations.   PROCEDURES:  Critical Care performed: No   MEDICATIONS ORDERED IN ED: Medications - No data to display   IMPRESSION / MDM / ASSESSMENT AND PLAN / ED COURSE  I reviewed the triage vital signs and the nursing notes.                              Differential diagnosis includes, but is not limited to, situational anxiety, adjustment d/o, malingering, chronic depression  Patient's presentation is most consistent with acute presentation with potential threat to life or bodily function.  The patient is on the cardiac monitor to evaluate for evidence of arrhythmia and/or significant heart rate changes  33 yo M well known to this ED wit h/o schizophrenia  here with worsening paranoia, depression. Sent in from ACT team. TTS/Psych consulted. Here voluntarily at this time. Screening lab work is unremarkable, with no emergent medical condition.    FINAL CLINICAL IMPRESSION(S) / ED DIAGNOSES   Final diagnoses:  Paranoid schizophrenia (HCC)     Rx / DC Orders   ED Discharge Orders     None        Note:  This document was prepared using Dragon voice recognition software and may include unintentional dictation errors.   Angelena Smalls, MD 01/09/23 2312

## 2023-01-09 NOTE — ED Notes (Signed)
 Patient denies SI/HI/AVH. Patient states he has been arguing with him mom and he just can't be around her.  Patient asking for food. When asked about drug use patient states, I haven't used today. But would not states last use was.

## 2023-01-09 NOTE — ED Notes (Signed)
 Pt changed in psych safe clothes.  Pt belongings included:  White shoes White socks Gray underwear Blue and white jeans Tan belt T shirt Black, green and blue coat White coat Black coat Wallet Cell phone

## 2023-01-09 NOTE — ED Notes (Signed)
 Kenneth Jensen, Quantity - [facility] member  Staff said pt called crisis line and was having biazare thoughts but is not suicidal.Pt then gave me his pocket knife and said I dont want to keep this I might hurt myself. This RN took pocket knife.  Staff helped admin pt meds at home bc patient was trying to take too many meds and he stopped him and brought him here.  This RN changed level of acuity and added psych orders.

## 2023-01-09 NOTE — BH Assessment (Addendum)
 Patient is refusing to speak with psych at this time.  TTS/Psych Will contact guardian for collateral and will continue to follow-up with patient when he is willing to engage in assessment.   Per TTS: This writer unable to complete a full assessment with this patient, he refused to follow this clinical research associate  in the consult room to talk with the Psyc NP. Patient can only tell this writer that I'm here to get some rest and I'm not supposed to be committed afterwards patient places the blanket over his head and disengages.    This clinical research associate attempted to contact patient's ACT team with [facility] with a previous number documented at [phone] [phone], attempt was made twice but no answer.   This clinical research associate contacted [facility]'s mobile crisis line and spoke with staff who gave the number of [phone], there was no answer at this number but it was identified as the ACT crisis line on the voicemail, this writer left a voicemail to return phone call.   This clinical research associate tried to identify who the patient's new legal guardian is but there is currently no documentation of that as of now.

## 2023-01-09 NOTE — BH Assessment (Signed)
 This clinical research associate unable to complete a full assessment with this patient, he refused to follow this clinical research associate  in the consult room to talk with the Psyc NP. Patient can only tell this writer that I'm here to get some rest and I'm not supposed to be committed afterwards patient places the blanket over his head and disengages.   This clinical research associate attempted to contact patient's ACT team with [facility] with a previous number documented at [phone], attempt was made twice but no answer.  This clinical research associate contacted [facility]'s mobile crisis line and spoke with staff who gave the number of [phone], there was no answer at this number but it was identified as the ACT crisis line on the voicemail, this writer left a voicemail to return phone call.  This clinical research associate tried to identify who the patient's new legal guardian is but there is currently no documentation of that as of now.

## 2023-01-10 DIAGNOSIS — F200 Paranoid schizophrenia: Secondary | ICD-10-CM | POA: Diagnosis not present

## 2023-01-10 NOTE — ED Notes (Signed)
 Dr. Derrill Kay here to speak with patient

## 2023-01-10 NOTE — BH Assessment (Signed)
 Psych Team attempted to complete consult but patient would not participate in the interview.

## 2023-01-10 NOTE — ED Provider Notes (Signed)
 Talked to patient. He states that he would like to be discharged. Came in yesterday because he got upset and wanted to get out of the situation. Feels more calm today. Denies any anger. Denies any SI/HI. At this time I think it is reasonable for patient to be discharged home.   Floy Roberts, MD 01/10/23 [phone]

## 2023-01-10 NOTE — ED Notes (Signed)
 Lunch tray provided.

## 2023-01-10 NOTE — ED Notes (Signed)
 Pt. Refused d/c vitals, states " I ain't got time for that I just want to get out of here"

## 2023-01-10 NOTE — ED Notes (Signed)
 Patient moved to [facility] room 3

## 2023-01-10 NOTE — ED Notes (Signed)
 VOL/ Need reassessed

## 2023-01-10 NOTE — ED Provider Notes (Signed)
 Emergency Medicine Observation Re-evaluation Note  Kenneth Jensen is a 33 y.o. male, seen on rounds today.  Pt initially presented to the ED for complaints of Anxiety Currently, the patient is resting comfortably.  Physical Exam  BP (!) 137/92 (BP Location: Left Arm)   Pulse 79   Temp 98.4 F (36.9 C) (Oral)   Resp 17   Wt 98 kg   SpO2 95%   BMI 34.87 kg/m  Physical Exam General: No acute distress Cardiac: No cyanosis Lungs: Normal work of breathing Psych: Calm and cooperative  ED Course / MDM  EKG:   I have reviewed the labs performed to date as well as medications administered while in observation.  Recent changes in the last 24 hours include no acute events.  Plan  Current plan is for psychiatric assessment.    Malvina Alm DASEN, MD 01/10/23 0730

## 2023-01-10 NOTE — ED Notes (Addendum)
 Nurse talked to the Patient and He said that He was just so so and that He didn't care about anything anymore, He shut down at that point and said " I just want to sleep now" staff will continue to monitor for safety.

## 2023-01-10 NOTE — ED Provider Notes (Signed)
 Emergency Medicine Observation Re-evaluation Note  Kenneth Jensen is a 33 y.o. male, seen on rounds today.  Pt initially presented to the ED for complaints of Anxiety  Currently, the patient is resting comfortably.  Physical Exam  BP (!) 108/57   Pulse 67   Temp 98.8 F (37.1 C) (Oral)   Resp 16   Wt 98 kg   SpO2 97%   BMI 34.87 kg/m  General: No acute distress Cardiac: Well-perfused extremities Lungs: No respiratory distress Psych: Appropriate mood and affect  ED Course / MDM  EKG:   I have reviewed the labs performed to date as well as medications administered while in observation.  Recent changes in the last 24 hours include none.  Plan  Current plan is for placement.   Eman Rynders K, MD 01/10/23 [phone]

## 2023-01-10 NOTE — ED Notes (Addendum)
 Pt. Is requesting to leave, denies SI/HI, AVH. Will notify MD

## 2023-01-11 ENCOUNTER — Encounter: Payer: Self-pay | Admitting: Emergency Medicine

## 2023-01-11 ENCOUNTER — Emergency Department
Admission: EM | Admit: 2023-01-11 | Discharge: 2023-01-12 | Disposition: A | Discharge status: Other Acute Inpatient Hospital | Payer: Medicare HMO | Attending: Emergency Medicine | Admitting: Emergency Medicine

## 2023-01-11 ENCOUNTER — Other Ambulatory Visit: Payer: Self-pay

## 2023-01-11 DIAGNOSIS — F209 Schizophrenia, unspecified: Secondary | ICD-10-CM | POA: Diagnosis present

## 2023-01-11 DIAGNOSIS — R440 Auditory hallucinations: Secondary | ICD-10-CM | POA: Diagnosis present

## 2023-01-11 DIAGNOSIS — F29 Unspecified psychosis not due to a substance or known physiological condition: Secondary | ICD-10-CM | POA: Insufficient documentation

## 2023-01-11 DIAGNOSIS — R4689 Other symptoms and signs involving appearance and behavior: Secondary | ICD-10-CM | POA: Diagnosis present

## 2023-01-11 DIAGNOSIS — F23 Brief psychotic disorder: Secondary | ICD-10-CM | POA: Diagnosis present

## 2023-01-11 DIAGNOSIS — R456 Violent behavior: Secondary | ICD-10-CM | POA: Diagnosis not present

## 2023-01-11 DIAGNOSIS — F200 Paranoid schizophrenia: Secondary | ICD-10-CM | POA: Diagnosis present

## 2023-01-11 DIAGNOSIS — R45851 Suicidal ideations: Secondary | ICD-10-CM

## 2023-01-11 DIAGNOSIS — F201 Disorganized schizophrenia: Secondary | ICD-10-CM | POA: Diagnosis present

## 2023-01-11 NOTE — Consult Note (Signed)
 [facility] Psychiatric Consult Initial  Patient Name: .Kenneth Jensen  MRN: [id number]  DOB: 1990-12-13  Consult Order details:  Orders (From admission, onward)     Start     Ordered   01/11/23 2011  CONSULT TO CALL ACT TEAM       Ordering Provider: Jossie Artist POUR, MD  Provider:  (Not yet assigned)  Question:  Reason for Consult?  Answer:  Psych consult   01/11/23 2010   01/11/23 2011  IP CONSULT TO PSYCHIATRY       Ordering Provider: Jossie Artist POUR, MD  Provider:  (Not yet assigned)  Question Answer Comment  Consult Timeframe ROUTINE - requires response within 24 hours   Reason for Consult? Consult for medication management   Contact phone number where the requesting provider can be reached [phone]      01/11/23 2010             Mode of Visit: Tele-visit Virtual Statement:TELE PSYCHIATRY ATTESTATION & CONSENT As the provider for this telehealth consult, I attest that I verified the patient's identity using two separate identifiers, introduced myself to the patient, provided my credentials, disclosed my location, and performed this encounter via a HIPAA-compliant, real-time, face-to-face, two-way, interactive audio and video platform and with the full consent and agreement of the patient (or guardian as applicable.) Patient physical location: [facility]. Telehealth provider physical location: home office in state of [place].   Video start time: 0922 Video end time: 1000    Psychiatry Consult Evaluation  Service Date: January 11, 2023 LOS:  LOS: 0 days  Chief Complaint suicidal thoughts  Primary Psychiatric Diagnoses     Suicidal ideation   Paranoid schizophrenia (HCC)   Auditory hallucinations   Acute psychosis (HCC)    Assessment  Kenneth Jensen is a 33 y.o. male admitted: Presented to the ED 01/11/2023  7:49 PM for suicidal statements and bizarre behaviors. He carries the psychiatric diagnoses of schizophrenia.     His current presentation of SI is most consistent  with schizophrenia. He meets criteria for inpatient hospitalization based on declining mental health. On initial examination, patient withdrawn and labile. Please see plan below for detailed recommendations.   Diagnoses:  Active Hospital problems: Principal Problem:   Suicidal ideation Active Problems:   Paranoid schizophrenia (HCC)   Auditory hallucinations   Acute psychosis (HCC)    Plan   ## Psychiatric Medication Recommendations:  Resume home meds once reconciled  ## Medical Decision Making Capacity: Not specifically addressed in this encounter  ## Further Work-up:  Kenneth Jensen was admitted to [facility] for Suicidal ideation, crisis management, and stabilization. Routine labs ordered, which include Lab Orders  No laboratory test(s) ordered today   Medication Management: Medications restarted once reconciled Will maintain observation checks every 15 minutes for safety. Psychosocial education regarding relapse prevention and self-care; social and communication  Social work will consult with family for collateral information and discuss discharge and follow up plan.  ## Disposition:-- We recommend inpatient psychiatric hospitalization when medically cleared. Patient is under voluntary admission status at this time; please IVC if attempts to leave hospital.  ## Behavioral / Environmental: -Recommend using specific terminology regarding PNES, i.e. call the episodes non-epileptic seizures rather than pseudoseizures as the latter insinuates fake or feigned symptoms, when the events are a very real experience to the patient and are a physical, non-volitional, manifestation of fear, pain and anxiety. , To minimize splitting of staff, assign one staff person to communicate all information from  the team when feasible., or Utilize compassion and acknowledge the patient's experiences while setting clear and realistic expectations for care.    ## Safety and Observation Level:  -  Based on my clinical evaluation, I estimate the patient to be at moderate risk of self harm in the current setting. - At this time, we recommend  routine. This decision is based on my review of the chart including patient's history and current presentation, interview of the patient, mental status examination, and consideration of suicide risk including evaluating suicidal ideation, plan, intent, suicidal or self-harm behaviors, risk factors, and protective factors. This judgment is based on our ability to directly address suicide risk, implement suicide prevention strategies, and develop a safety plan while the patient is in the clinical setting. Please contact our team if there is a concern that risk level has changed.  CSSR Risk Category:C-SSRS RISK CATEGORY: High Risk  Suicide Risk Assessment: Patient has following modifiable risk factors for suicide: active suicidal ideation, under treated depression , and medication noncompliance, which we are addressing by admitting to the psychiatric inpatient hospitalization. Patient has following non-modifiable or demographic risk factors for suicide: male gender and psychiatric hospitalization Patient has the following protective factors against suicide: Access to outpatient mental health care and Supportive family  Thank you for this consult request. Recommendations have been communicated to the primary team.  We will recommend inpatient hospitalization at this time.   [name], NP       History of Present Illness  Relevant Aspects of Hospital ED Course:  Admitted on 01/11/2023 for suicidal statements and bizarre behavior.   Patient Report: Upon entering the patient's room, patient was sleeping with the pillow covering his face.  At this time he stated that had a rough day an would like to continue resting If I didn't mind.  At this time, he denied SI.  However, this would be his third ER visit this week and his actt team reports bizarre  behaviors lately and feels that his mental health has been declining.  Pharrell has a history ofdrug use, but unfortunately he refuses to let anyone draw his blood or urinate for UDS.  Collateral information:  TTS spoke with Darius, mental health crisis act team lead [phone]) who reported that the pt called the crisis line in an agitated state. Darius explained that the pt expressed that he feels no one cares about him and began endorsing SI and stated he was going to call 911. Dariusz reported that the pt verbalized wanting to be in the hospital for 39 days. Dariusz explained that patent examiner had indeed arrived upon his arrival and the pt was hand cuffed. Dariusz reported that the pt could possibly be having a reaction to having been appointed a legal guardian in Dec 2024 and potentially having to live in a group home setting. Darius provided legal guardian's contact information.   An attempt to contact legal guardian Vinie Roughen [phone]) was also made.  Review of Systems  Psychiatric/Behavioral:  Positive for hallucinations and suicidal ideas.   All other systems reviewed and are negative.     Exam Findings  Physical Exam:  Vital Signs:  Temp:  [98.4 F (36.9 C)] 98.4 F (36.9 C) (01/05 1936) Pulse Rate:  [87] 87 (01/05 1936) Resp:  [17] 17 (01/05 1936) BP: (150)/(92) 150/92 (01/05 1936) SpO2:  [93 %] 93 % (01/05 1936) Weight:  [98 kg] 98 kg (01/05 1934) Blood pressure (!) 150/92, pulse 87, temperature 98.4 F (36.9  C), temperature source Oral, resp. rate 17, height 5' 6 (1.676 m), weight 98 kg, SpO2 93%. Body mass index is 34.86 kg/m.  Physical Exam Vitals and nursing note reviewed.  Constitutional:      General: He is in acute distress.  HENT:     Head: Normocephalic and atraumatic.  Pulmonary:     Effort: Pulmonary effort is normal.  Musculoskeletal:        General: Normal range of motion.     Cervical back: Normal range of motion.  Skin:    General:  Skin is warm.  Neurological:     Mental Status: He is oriented to person, place, and time.  Psychiatric:        Attention and Perception: He is inattentive.        Mood and Affect: Mood is anxious. Affect is labile and inappropriate.        Speech: He is noncommunicative.        Behavior: Behavior is agitated and withdrawn. Behavior is cooperative.        Thought Content: Thought content is paranoid. Thought content includes suicidal ideation. Thought content includes suicidal plan.        Cognition and Memory: Cognition is impaired. Memory is impaired.        Judgment: Judgment is impulsive.     Mental Status Exam: General Appearance: Bizarre and Guarded  Orientation:  Full (Time, Place, and Person)  Memory:  Immediate;   Fair Recent;   Fair Remote;   Fair  Concentration:  Concentration: Fair and Attention Span: Fair  Recall:  Fair  Attention  Poor  Eye Contact:  Minimal  Speech:  Blocked  Language:  Poor  Volume:  Decreased  Mood: labile, agitated  Affect:  Congruent and Constricted  Thought Process:  NA  Thought Content:  Illogical  Suicidal Thoughts:  Yes.  with intent/plan  Homicidal Thoughts:  No  Judgement:  Impaired  Insight:  Lacking  Psychomotor Activity:  Normal  Akathisia:  NA  Fund of Knowledge:  Fair      Assets:  Desire for Improvement Financial Resources/Insurance Physical Health  Cognition:  Impaired,  Mild  ADL's:  Intact  AIMS (if indicated):        Other History   These have been pulled in through the EMR, reviewed, and updated if appropriate.  Family History:  The patient's family history is not on file.  Medical History: Past Medical History:  Diagnosis Date  . Asthma   . Bipolar 1 disorder (HCC)   . Depression   . Schizophrenia (HCC)   . Schizotaxia     Surgical History: History reviewed. No pertinent surgical history.   Medications:  No current facility-administered medications for this encounter.  Current Outpatient  Medications:  .  divalproex  (DEPAKOTE  ER) 500 MG 24 hr tablet, Take 2 tablets (1,000 mg total) by mouth at bedtime for 14 days., Disp: 28 tablet, Rfl: 0 .  haloperidol  (HALDOL ) 10 MG tablet, Take 10 mg by mouth 2 (two) times daily., Disp: , Rfl:  .  hydrOXYzine  (ATARAX ) 50 MG tablet, Take 1 tablet (50 mg total) by mouth 3 (three) times daily as needed for anxiety., Disp: 14 tablet, Rfl: 0 .  lidocaine  (LIDODERM ) 5 %, Place 1 patch onto the skin daily as needed. Remove & Discard patch within 12 hours or as directed by MD, Disp: , Rfl:  .  nicotine  polacrilex (NICORETTE ) 2 MG gum, Take 1 each (2 mg total) by mouth as needed  for smoking cessation., Disp: 100 tablet, Rfl: 0 .  paliperidone  (INVEGA  SUSTENNA) 234 MG/1.5ML injection, Inject 234 mg into the muscle every 28 (twenty-eight) days., Disp: 1.8 mL, Rfl: 1 .  paliperidone  (INVEGA ) 3 MG 24 hr tablet, Take 3 mg by mouth at bedtime., Disp: , Rfl:  .  prazosin  (MINIPRESS ) 1 MG capsule, Take 1 capsule (1 mg total) by mouth at bedtime., Disp: 14 capsule, Rfl: 0 .  QUEtiapine  (SEROQUEL ) 200 MG tablet, Take 1 tablet (200 mg total) by mouth at bedtime for 14 days., Disp: 14 tablet, Rfl: 0 .  traZODone  (DESYREL ) 50 MG tablet, Take 1 tablet (50 mg total) by mouth at bedtime as needed for up to 14 days for sleep., Disp: 14 tablet, Rfl: 0  Allergies: Allergies  Allergen Reactions  . Penicillins Anaphylaxis  . Amoxicillin Swelling  . Haldol  [Haloperidol ]     Pt states I went crazy  . Percocet [Oxycodone-Acetaminophen ]   . Vicodin [Hydrocodone-Acetaminophen ] Itching  . Vicodin [Hydrocodone-Acetaminophen ]     [name], NP

## 2023-01-11 NOTE — BH Assessment (Addendum)
 Collateral: This clinical research associate spoke with Garrison, mental health crisis act team lead [phone]) who reported that the pt called the crisis line in an agitated state. Dariusz explained that the pt expressed that he feels no one cares about him and began endorsing SI and stated he was going to call 911. Dariusz reported that the pt verbalized wanting to be in the hospital for 39 days. Dariusz explained that patent examiner had indeed arrived upon his arrival and the pt was hand cuffed. Dariusz reported that the pt could possibly be having a reaction to having been appointed a legal guardian in Dec 2024 and potentially having to live in a group home setting. Darius provided legal guardian's contact information.  This clinical research associate attempted to contact legal guardian Vinie Roughen [phone]).

## 2023-01-11 NOTE — ED Notes (Signed)
 Dariusz, mental health crisis acting lead, can be reached at [phone].

## 2023-01-11 NOTE — ED Triage Notes (Signed)
 Pt arrives accompanied by Dariusz, mental health crisis acting lead d/t concerns following a crisis call. Pt had been agitated at home with mother, suicidal ideation, and hallucinations. When pt was brought back to triage pt kneeled on the floor and began to pray.

## 2023-01-11 NOTE — BH Assessment (Signed)
 Comprehensive Clinical Assessment (CCA) Note  01/11/2023 Kenneth Jensen [id number] Recommendations for Services/Supports/Treatments: Psych NP [name] determined pt. meets psychiatric inpatient criteria. Lonni JONETTA. Kenneth Jensen is a 33 year old, English speaking, Black male with a hx of schizoaffective disorder, bipolar type, cocaine use disorder severe, and cannabis use d/o, moderate. Pt presented to [facility] ED Vol from home with complaints of auditory hallucinations. Per triage note: Pt arrives accompanied by Dariusz, mental health crisis acting lead d/t concerns following a crisis call. Pt had been agitated at home with mother, suicidal ideation, and hallucinations. When pt was brought back to triage pt kneeled on the floor and began to pray.  Pt was resting with the covers over his head. When asked how pt was feeling the pt stated, "Mad". Pt identified his main stressor as family conflict and feelings of loneliness. When asked why he'd presented to the ER pt. stating, "It's like 93 girls sending me perverted stuff on social media and my mama kept my kids from me." The patient admitted to using a small amount of alcohol prior to arrival. Pt denied the need for substance abuse treatment. The pt. made fair eye contact and proceeded ruminating about family conflict. Pt did not appear to be responding to internal stimuli. Pt's thoughts were relevant and he was oriented x4. Pt had a normal rate of speech and unremarkable psychomotor activity. Pt had a neglected appearance. Pt presented with a labile mood; affect was congruent. Pt denied current SI/HI/V/H. Pt reported that he does not feel suicidal because, "My mama don't got time with her broke ass to pay for no funeral."  Chief Complaint:  Chief Complaint  Patient presents with   Psychiatric Evaluation   Visit Diagnosis: Schizophrenia    CCA Screening, Triage and Referral (STR)  Patient Reported Information How did you hear about us ? Other  (Comment) (ACT Team Lead [name])  Referral name: No data recorded Referral phone number: No data recorded  Whom do you see for routine medical problems? No data recorded Practice/Facility Name: No data recorded Practice/Facility Phone Number: No data recorded Name of Contact: No data recorded Contact Number: No data recorded Contact Fax Number: No data recorded Prescriber Name: No data recorded Prescriber Address (if known): No data recorded  What Is the Reason for Your Visit/Call Today? Pt brought to ED due to endorsing SI to crisis lead.  How Long Has This Been Causing You Problems? > than 6 months  What Do You Feel Would Help You the Most Today? Treatment for Depression or other mood problem; Stress Management   Have You Recently Been in Any Inpatient Treatment (Hospital/Detox/Crisis Center/28-Day Program)? No data recorded Name/Location of Program/Hospital:No data recorded How Long Were You There? No data recorded When Were You Discharged? No data recorded  Have You Ever Received Services From [facility] Before? No data recorded Who Do You See at [facility]? No data recorded  Have You Recently Had Any Thoughts About Hurting Yourself? Yes  Are You Planning to Commit Suicide/Harm Yourself At This time? No   Have you Recently Had Thoughts About Hurting Someone [name]? No  Explanation: Pt denied.   Have You Used Any Alcohol or Drugs in the Past 24 Hours? Yes  How Long Ago Did You Use Drugs or Alcohol? No data recorded What Did You Use and How Much? Alcohol; amount unknown   Do You Currently Have a Therapist/Psychiatrist? Yes  Name of Therapist/Psychiatrist: [facility] ACT Team   Have You Been Recently Discharged From Any Office  Practice or Programs? No  Explanation of Discharge From Practice/Program: n/a     CCA Screening Triage Referral Assessment Type of Contact: Face-to-Face  Is this Initial or Reassessment? No data recorded Date Telepsych consult ordered in  CHL:  No data recorded Time Telepsych consult ordered in CHL:  No data recorded  Patient Reported Information Reviewed? No data recorded Patient Left Without Being Seen? No data recorded Reason for Not Completing Assessment: Patient was unable to provide coherent information.   Collateral Involvement: Dariusz, mental health crisis act team lead [phone])   Does Patient Have a Court Appointed Legal Guardian? No data recorded Name and Contact of Legal Guardian: No data recorded If Minor and Not Living with Parent(s), Who has Custody? n/a  Is CPS involved or ever been involved? Never  Is APS involved or ever been involved? Never   Patient Determined To Be At Risk for Harm To Self or Others Based on Review of Patient Reported Information or Presenting Complaint? No  Method: No Plan  Availability of Means: No access or NA  Intent: Vague intent or NA  Notification Required: No need or identified person  Additional Information for Danger to Others Potential: Active psychosis  Additional Comments for Danger to Others Potential: n/a  Are There Guns or Other Weapons in Your Home? No  Types of Guns/Weapons: n/a  Are These Weapons Safely Secured?                            No  Who Could Verify You Are Able To Have These Secured: n/a  Do You Have any Outstanding Charges, Pending Court Dates, Parole/Probation? None reported  Contacted To Inform of Risk of Harm To Self or Others: Other: Comment   Location of Assessment: [facility] ED   Does Patient Present under Involuntary Commitment? No  IVC Papers Initial File Date: No data recorded  [place] of Residence: [place]   Patient Currently Receiving the Following Services: Individual Therapy; Medication Management   Determination of Need: Emergent (2 hours)   Options For Referral: ED Visit; Medication Management; Therapeutic Triage Services     CCA Biopsychosocial Intake/Chief Complaint:  No data recorded Current  Symptoms/Problems: No data recorded  Patient Reported Schizophrenia/Schizoaffective Diagnosis in Past: Yes   Strengths: Have some insight, seeking help and able to take care of his ADL's.  Preferences: No data recorded Abilities: No data recorded  Type of Services Patient Feels are Needed: No data recorded  Initial Clinical Notes/Concerns: No data recorded  Mental Health Symptoms Depression:  Irritability; Hopelessness   Duration of Depressive symptoms: Less than two weeks   Mania:  N/A   Anxiety:   Worrying; Tension   Psychosis:  Delusions   Duration of Psychotic symptoms: Greater than six months   Trauma:  N/A   Obsessions:  Cause anxiety; Attempts to suppress/neutralize; Disrupts routine/functioning; Recurrent & persistent thoughts/impulses/images; Intrusive/time consuming; Poor insight   Compulsions:  Driven to perform behaviors/acts; Intrusive/time consuming; Poor Insight   Inattention:  N/A   Hyperactivity/Impulsivity:  N/A   Oppositional/Defiant Behaviors:  Resentful   Emotional Irregularity:  Recurrent suicidal behaviors/gestures/threats   Other Mood/Personality Symptoms:  n/a    Mental Status Exam Appearance and self-care  Stature:  Average   Weight:  Overweight   Clothing:  Disheveled   Grooming:  Neglected   Cosmetic use:  None   Posture/gait:  Normal   Motor activity:  -- (n/a)   Sensorium  Attention:  Normal  Concentration:  Normal   Orientation:  X5   Recall/memory:  Normal   Affect and Mood  Affect:  Labile   Mood:  Angry   Relating  Eye contact:  Normal   Facial expression:  Anxious; Tense   Attitude toward examiner:  Cooperative   Thought and Language  Speech flow: Flight of Ideas; Pressured   Thought content:  Appropriate to Mood and Circumstances   Preoccupation:  Ruminations   Hallucinations:  Auditory   Organization:  No data recorded  [employer] of Knowledge:  Fair   Intelligence:   Average   Abstraction:  Concrete   Judgement:  Impaired   Reality Testing:  Distorted   Insight:  Poor   Decision Making:  Impulsive; Confused   Social Functioning  Social Maturity:  Isolates   Social Judgement:  Chief Of Staff; Naive; Heedless   Stress  Stressors:  Other (Comment)   Coping Ability:  Overwhelmed   Skill Deficits:  Self-care   Supports:  Family     Religion: Religion/Spirituality Are You A Religious Person?: Yes What is Your Religious Affiliation?: Christian How Might This Affect Treatment?: n/a  Leisure/Recreation: Leisure / Recreation Do You Have Hobbies?: No  Exercise/Diet: Exercise/Diet Do You Exercise?: No Have You Gained or Lost A Significant Amount of Weight in the Past Six Months?: No Do You Follow a Special Diet?: No Do You Have Any Trouble Sleeping?: No   CCA Employment/Education Employment/Work Situation: Employment / Work Situation Employment Situation: On disability Why is Patient on Disability: Mental Health Dx. How Long has Patient Been on Disability:  Half my life  Patient's Job has Been Impacted by Current Illness: No Has Patient ever Been in the Military?: No  Education: Education Is Patient Currently Attending School?: No Did You Product Manager?: No Did You Have An Individualized Education Program (IIEP): No Did You Have Any Difficulty At School?: No Patient's Education Has Been Impacted by Current Illness: No   CCA Family/Childhood History Family and Relationship History: Family history Marital status: Single Does patient have children?: No  Childhood History:  Childhood History By whom was/is the patient raised?: Mother Did patient suffer any verbal/emotional/physical/sexual abuse as a child?: No Did patient suffer from severe childhood neglect?: No Has patient ever been sexually abused/assaulted/raped as an adolescent or adult?: No Was the patient ever a victim of a crime or a disaster?: No Witnessed  domestic violence?: No Has patient been affected by domestic violence as an adult?: No  Child/Adolescent Assessment:     CCA Substance Use Alcohol/Drug Use: Alcohol / Drug Use Pain Medications: See MAR Prescriptions: See MAR Over the Counter: See MAR Longest period of sobriety (when/how long): Unable to quantify Negative Consequences of Use: Personal relationships Withdrawal Symptoms: None Substance #1 Name of Substance 1: Cannabis 1 - Age of First Use: UTA 1 - Amount (size/oz): UTA 1 - Frequency: UTA 1 - Duration: Ongoing 1 - Last Use / Amount: UTA 1 - Method of Aquiring: UTA                       ASAM's:  Six Dimensions of Multidimensional Assessment  Dimension 1:  Acute Intoxication and/or Withdrawal Potential:   Dimension 1:  Description of individual's past and current experiences of substance use and withdrawal: Pt has a hx of and current use of cannabis and alcohol  Dimension 2:  Biomedical Conditions and Complications:   Dimension 2:  Description of patient's biomedical conditions and  complications: None noted  Dimension 3:  Emotional, Behavioral, or Cognitive Conditions and Complications:  Dimension 3:  Description of emotional, behavioral, or cognitive conditions and complications: Schizoaffective disorder, bipolar type  Dimension 4:  Readiness to Change:  Dimension 4:  Description of Readiness to Change criteria: Pt denies recent use  Dimension 5:  Relapse, Continued use, or Continued Problem Potential:  Dimension 5:  Relapse, continued use, or continued problem potential critiera description: Pt identified living environment as a severe stressor.  Dimension 6:  Recovery/Living Environment:  Dimension 6:  Recovery/Iiving environment criteria description: Pt identified living environment as a severe stressor.  ASAM Severity Score: ASAM's Severity Rating Score: 13  ASAM Recommended Level of Treatment: ASAM Recommended Level of Treatment: Level II Intensive  Outpatient Treatment   Substance use Disorder (SUD) Substance Use Disorder (SUD)  Checklist Symptoms of Substance Use: Continued use despite having a persistent/recurrent physical/psychological problem caused/exacerbated by use, Continued use despite persistent or recurrent social, interpersonal problems, caused or exacerbated by use, Presence of craving or strong urge to use  Recommendations for Services/Supports/Treatments: Recommendations for Services/Supports/Treatments Recommendations For Services/Supports/Treatments: ACCTT (Assertive Community Treatment), Medication Management, Inpatient Hospitalization  DSM5 Diagnoses: Patient Active Problem List   Diagnosis Date Noted   Acute psychosis (HCC) 11/14/2022   Cocaine use disorder, severe, dependence (HCC) 09/04/2022   Vitamin D  deficiency, unspecified 06/08/2022   Mixed hyperlipidemia 06/08/2022   Cocaine abuse (HCC) 11/22/2021   Cannabis use disorder    Prediabetes 05/16/2020   Auditory hallucinations    Paranoid schizophrenia (HCC) 03/25/2019   Cannabis use disorder, moderate, dependence (HCC) 04/21/2011   Tobacco use disorder 04/21/2011    Patient Centered Plan: Patient is on the following Treatment Plan(s):  Depression   Referrals to Alternative Service(s): Referred to Alternative Service(s):   Place:   Date:   Time:    Referred to Alternative Service(s):   Place:   Date:   Time:    Referred to Alternative Service(s):   Place:   Date:   Time:    Referred to Alternative Service(s):   Place:   Date:   Time:      @BHCOLLABOFCARE @  Mellina Benison R Shawntez Dickison, LCAS

## 2023-01-11 NOTE — ED Provider Notes (Signed)
 [facility] Provider Note   Event Date/Time   First MD Initiated Contact with Patient 01/11/23 2008     (approximate) History  Psychiatric Evaluation  HPI Kenneth Jensen is a 33 y.o. male with a stated past medical history of paranoid schizophrenia, anxiety, and depression who presents complaining of worsening depressive symptoms as well as voicing suicidal ideation to his outpatient mobile mental health crisis team.  Patient presents voluntarily stating that I cannot do phones because it make me depressed.  Patient denies any homicidal ideation, auditory/visual hallucinations, recent travel, sick contacts, illicit drug use, or heavy alcohol use. ROS: Patient currently denies any vision changes, tinnitus, difficulty speaking, facial droop, sore throat, chest pain, shortness of breath, abdominal pain, nausea/vomiting/diarrhea, dysuria, or weakness/numbness/paresthesias in any extremity   Physical Exam  Triage Vital Signs: ED Triage Vitals  Encounter Vitals Group     BP 01/11/23 1936 (!) 150/92     Systolic BP Percentile --      Diastolic BP Percentile --      Pulse Rate 01/11/23 1936 87     Resp 01/11/23 1936 17     Temp 01/11/23 1936 98.4 F (36.9 C)     Temp Source 01/11/23 1936 Oral     SpO2 01/11/23 1936 93 %     Weight 01/11/23 1934 216 lb (98 kg)     Height 01/11/23 1934 5' 6 (1.676 m)     Head Circumference --      Peak Flow --      Pain Score 01/11/23 1934 0     Pain Loc --      Pain Education --      Exclude from Growth Chart --    Most recent vital signs: Vitals:   01/11/23 1936  BP: (!) 150/92  Pulse: 87  Resp: 17  Temp: 98.4 F (36.9 C)  SpO2: 93%   General: Awake, oriented x4. CV:  Good peripheral perfusion.  Resp:  Normal effort.  Abd:  No distention.  Other:  Middle-aged obese African-American male resting comfortably in no acute distress ED Results / Procedures / Treatments  Labs (all labs ordered are listed, but  only abnormal results are displayed) Labs Reviewed - No data to display PROCEDURES: Critical Care performed: No Procedures MEDICATIONS ORDERED IN ED: Medications - No data to display IMPRESSION / MDM / ASSESSMENT AND PLAN / ED COURSE  I reviewed the triage vital signs and the nursing notes.                             The patient is on the cardiac monitor to evaluate for evidence of arrhythmia and/or significant heart rate changes. Patient's presentation is most consistent with acute presentation with potential threat to life or bodily function. Thoughts are linear and organized, and patient has no AH, VH, or HI. Prior suicide attempt: Overdose Prior Psychiatric Hospitalizations: Multiple  Clinically patient displays no overt toxidrome; they are well appearing, with low suspicion for toxic ingestion given history and exam. Thoughts unlikely 2/2 anemia, hypothyroidism, infection, or ICH.  Consult: Psychiatry to evaluate patient for potential hold for danger to self. Disposition: Plan admit to psychiatry for further management of symptoms.    FINAL CLINICAL IMPRESSION(S) / ED DIAGNOSES   Final diagnoses:  Suicidal ideation  Aggressive behavior   Rx / DC Orders   ED Discharge Orders     None      Note:  This document was prepared using Dragon voice recognition software and may include unintentional dictation errors.   Tanaysha Alkins K, MD 01/11/23 [phone]

## 2023-01-11 NOTE — ED Notes (Signed)
 Per MD Bradler no blood needed at this time due to collected Friday night.

## 2023-01-11 NOTE — ED Notes (Signed)
 Unsuccessful blood attempt x1

## 2023-01-11 NOTE — ED Notes (Signed)
 Pt arrived with [employer] and criss line staff, pt is having some sort of hallucinations, he had called the crisis line, unsure to make out full conversation d/t bad conection, but staff feels pt may have said he may have stated he was SI. Pt hung up and called 911, LEO arrived and reported that he told dispatcher he had a knife he bought and may use it. Told LEO he wanted to go to the hospital.possible ETOH.

## 2023-01-12 DIAGNOSIS — N390 Urinary tract infection, site not specified: Secondary | ICD-10-CM | POA: Diagnosis not present

## 2023-01-12 DIAGNOSIS — F29 Unspecified psychosis not due to a substance or known physiological condition: Secondary | ICD-10-CM | POA: Diagnosis not present

## 2023-01-12 DIAGNOSIS — F17210 Nicotine dependence, cigarettes, uncomplicated: Secondary | ICD-10-CM | POA: Diagnosis not present

## 2023-01-12 DIAGNOSIS — F3489 Other specified persistent mood disorders: Secondary | ICD-10-CM | POA: Diagnosis not present

## 2023-01-12 DIAGNOSIS — Z712 Person consulting for explanation of examination or test findings: Secondary | ICD-10-CM | POA: Diagnosis not present

## 2023-01-12 DIAGNOSIS — F250 Schizoaffective disorder, bipolar type: Secondary | ICD-10-CM | POA: Diagnosis not present

## 2023-01-12 DIAGNOSIS — F17213 Nicotine dependence, cigarettes, with withdrawal: Secondary | ICD-10-CM | POA: Diagnosis not present

## 2023-01-12 DIAGNOSIS — R45851 Suicidal ideations: Secondary | ICD-10-CM | POA: Diagnosis not present

## 2023-01-12 DIAGNOSIS — F200 Paranoid schizophrenia: Secondary | ICD-10-CM | POA: Diagnosis not present

## 2023-01-12 DIAGNOSIS — R82998 Other abnormal findings in urine: Secondary | ICD-10-CM | POA: Diagnosis not present

## 2023-01-12 DIAGNOSIS — G4700 Insomnia, unspecified: Secondary | ICD-10-CM | POA: Diagnosis not present

## 2023-01-12 DIAGNOSIS — R8271 Bacteriuria: Secondary | ICD-10-CM | POA: Diagnosis not present

## 2023-01-12 DIAGNOSIS — F1910 Other psychoactive substance abuse, uncomplicated: Secondary | ICD-10-CM | POA: Diagnosis not present

## 2023-01-12 MED ORDER — HALOPERIDOL 5 MG PO TABS
10.0000 mg | ORAL_TABLET | Freq: Two times a day (BID) | ORAL | Status: DC
  Administered 2023-01-12: 10 mg via ORAL
  Filled 2023-01-12: qty 2

## 2023-01-12 MED ORDER — OLANZAPINE 5 MG PO TABS
5.0000 mg | ORAL_TABLET | Freq: Once | ORAL | Status: AC
  Administered 2023-01-12: 5 mg via ORAL
  Filled 2023-01-12: qty 1

## 2023-01-12 NOTE — ED Notes (Signed)
 Vol/consult done by NP Dixon/recommend inpatient psychiatric hospitalization when medically cleared.

## 2023-01-12 NOTE — ED Notes (Signed)
 Good Morning, patient has been accepted to [facility] and can go now. Sue Lush are you ready for Safe Transport to be called ?? If so please complete all paperwork for transfer.

## 2023-01-12 NOTE — ED Notes (Signed)
 Pt back to nurses station several times to ask if he can go home. Pt states he hasn't been acting out at all and should be able to leave. Pt asking staff to call [facility] so he can speak to Dariusz. Pt states Garrison is who brought him here with PD and he had told pt that he wouldn't get admitted and would be discharged home.

## 2023-01-12 NOTE — ED Notes (Signed)
Pt given supplies to shower. Pt showering at this time.

## 2023-01-12 NOTE — ED Notes (Signed)
 Hospital meal provided, pt tolerated w/o complaints.  Waste discarded appropriately.

## 2023-01-12 NOTE — ED Notes (Signed)
 Pt to nurses station asking to speak with staff member from [facility]. Pt states he was told by [facility] staff that he would get to go home and would not be admitted. Pt states he is wanting to go home and doesn't want to go to [facility]. Pt states the last time he was there they would keep him 38 days next time he came and they fed him medications and he doesn't want that many medications.

## 2023-01-12 NOTE — ED Notes (Signed)
 Breakfast tray given to pt

## 2023-01-12 NOTE — BH Assessment (Signed)
 Per [facility] AC Gatha), patient to be referred out of system.  Referral information for Psychiatric Hospitalization faxed to;   [facility] [phone]- [phone]) No beds available.  [facility] [phone]),   [address] [phone]),  [place] [phone]--- [phone]--- [phone]--- [phone])  [address] [phone]),   [place] [phone]- [phone]),   [facility] [phone]),  [place] [phone])  [facility] [phone])

## 2023-01-12 NOTE — ED Notes (Signed)
 Patient now IVC waiting on paperwork to be signed by officer

## 2023-01-12 NOTE — ED Notes (Signed)
 Pt to nurses station and states that he is not wanting to be admitted.

## 2023-01-12 NOTE — ED Notes (Signed)
 Attempted to call report to [facility] but they said they could not accept him until 10 am and asked this nurse to call report when transport arrives to pick up pt.

## 2023-01-12 NOTE — ED Notes (Signed)
 Pt to nurses station. Pt states he is wanting to go home and is a grown man and shouldn't be forced to stay. Pt also requesting to shower. Pt told he would be next to shower after current pt showering is finished.

## 2023-01-12 NOTE — ED Notes (Signed)
 Patient to BHU 3 from main ED.  Patient oriented to unit with regards to rounding and cameras for safety.  Patient verbalized understanding.  Patient instructed to come to staff with any needs.

## 2023-01-12 NOTE — Consult Note (Signed)
 33 y/o male w/ history of schizophrenia. Pt presented to the ED after making crisis call to his ACT team. Spoke w/ Mr. Teena, of pt's ACT team, [phone]. Per Mr. Teena, pt was agitated and reported thoughts of killing himself. When Mr. Teena arrived at pt's residence, he was told by law enforcement officers that pt had called dispatch stating he was going to cut himself. Pt minimizing this and stating his razor was locked up when police arrived. Pt presents with delusions, states that there is a spirit in his home that cuts him.

## 2023-01-12 NOTE — ED Provider Notes (Signed)
-----------------------------------------   8:11 AM on 01/12/2023 ----------------------------------------- Patient has been accepted to [facility].  We will arrange for transportation.   Minna Antis, MD 01/12/23 [phone]

## 2023-01-12 NOTE — ED Notes (Signed)
 NP at the bedside

## 2023-01-12 NOTE — BH Assessment (Signed)
 Patient has been accepted to [facility] on today 01/12/23. Patient was not assigned to a room.  Accepting physician is Dr. Oneil Charleston.  Call report to [phone].  Representative was E[place].   ER Staff is aware of it:  Glenda, ER Secretary  Dr. Dorothyann, ER MD  Alfonso, Patient's Nurse     Writer attempted to contact patient's family/support system Lanier Millon [phone]); however, there was no answer and not able to leave a message.

## 2023-04-05 ENCOUNTER — Emergency Department
Admission: EM | Admit: 2023-04-05 | Discharge: 2023-04-05 | Disposition: A | Discharge status: Home / Self Care | Payer: Medicare (Managed Care) | Attending: Emergency Medicine | Admitting: Emergency Medicine

## 2023-04-05 ENCOUNTER — Other Ambulatory Visit: Payer: Self-pay

## 2023-04-05 DIAGNOSIS — R451 Restlessness and agitation: Secondary | ICD-10-CM | POA: Insufficient documentation

## 2023-04-05 DIAGNOSIS — F419 Anxiety disorder, unspecified: Secondary | ICD-10-CM | POA: Diagnosis present

## 2023-04-05 DIAGNOSIS — Y902 Blood alcohol level of 40-59 mg/100 ml: Secondary | ICD-10-CM | POA: Insufficient documentation

## 2023-04-05 LAB — CBC
HCT: 44.3 % (ref 39.0–52.0)
Hemoglobin: 14.8 g/dL (ref 13.0–17.0)
MCH: 29.2 pg (ref 26.0–34.0)
MCHC: 33.4 g/dL (ref 30.0–36.0)
MCV: 87.5 fL (ref 80.0–100.0)
Platelets: 374 10*3/uL (ref 150–400)
RBC: 5.06 MIL/uL (ref 4.22–5.81)
RDW: 13.4 % (ref 11.5–15.5)
WBC: 7.8 10*3/uL (ref 4.0–10.5)
nRBC: 0 % (ref 0.0–0.2)

## 2023-04-05 LAB — COMPREHENSIVE METABOLIC PANEL WITH GFR
ALT: 15 U/L (ref 0–44)
AST: 18 U/L (ref 15–41)
Albumin: 4.5 g/dL (ref 3.5–5.0)
Alkaline Phosphatase: 55 U/L (ref 38–126)
Anion gap: 12 (ref 5–15)
BUN: 6 mg/dL (ref 6–20)
CO2: 24 mmol/L (ref 22–32)
Calcium: 9 mg/dL (ref 8.9–10.3)
Chloride: 102 mmol/L (ref 98–111)
Creatinine, Ser: 0.81 mg/dL (ref 0.61–1.24)
GFR, Estimated: >60 mL/min (ref >60)
Glucose, Bld: 89 mg/dL (ref 70–99)
Potassium: 3.9 mmol/L (ref 3.5–5.1)
Sodium: 138 mmol/L (ref 135–145)
Total Bilirubin: 0.4 mg/dL (ref 0.0–1.2)
Total Protein: 7.9 g/dL (ref 6.5–8.1)

## 2023-04-05 LAB — ACETAMINOPHEN LEVEL: Acetaminophen (Tylenol), Serum: <10 ug/mL — ABNORMAL LOW (ref 10–30)

## 2023-04-05 LAB — URINE DRUG SCREEN, QUALITATIVE (ARMC ONLY)
Amphetamines, Ur Screen: NOT DETECTED
Barbiturates, Ur Screen: NOT DETECTED
Benzodiazepine, Ur Scrn: NOT DETECTED
Cannabinoid 50 Ng, Ur ~~LOC~~: POSITIVE — AB
Cocaine Metabolite,Ur ~~LOC~~: POSITIVE — AB
MDMA (Ecstasy)Ur Screen: NOT DETECTED
Methadone Scn, Ur: NOT DETECTED
Opiate, Ur Screen: NOT DETECTED
Phencyclidine (PCP) Ur S: NOT DETECTED
Tricyclic, Ur Screen: NOT DETECTED

## 2023-04-05 LAB — SALICYLATE LEVEL: Salicylate Lvl: <7 mg/dL — ABNORMAL LOW (ref 7.0–30.0)

## 2023-04-05 LAB — ETHANOL: Alcohol, Ethyl (B): 42 mg/dL — ABNORMAL HIGH (ref <10)

## 2023-04-05 MED ORDER — DROPERIDOL 2.5 MG/ML IJ SOLN
5.0000 mg | Freq: Once | INTRAMUSCULAR | Status: AC
  Administered 2023-04-05: 5 mg via INTRAMUSCULAR
  Filled 2023-04-05: qty 2

## 2023-04-05 NOTE — ED Notes (Signed)
 Pt given meal tray.

## 2023-04-05 NOTE — ED Notes (Signed)
 Pt requesting to take shower. Pt provided with shower supplies and clean scrubs.

## 2023-04-05 NOTE — ED Notes (Signed)
Pt given meal tray and ginger ale 

## 2023-04-05 NOTE — ED Notes (Addendum)
 Pt dressed out:  Yellow crocs Black/red shorts Black/red jacket One white metal necklace Black tobagan Cracked cell phone  Key chain  Black wallet

## 2023-04-05 NOTE — ED Provider Notes (Signed)
 [facility] Provider Note    Event Date/Time   First MD Initiated Contact with Patient 04/05/23 0127     (approximate)   History   Psychiatric Evaluation   HPI  Kenneth Jensen is a 33 y.o. male   Past medical history of bipolar, schizophrenia and depression who presents to the emergency department with anxiety, requesting psychiatric evaluation.  He denies SI HI or AVH.  He appears agitated at time, he points up to the ceiling at a sign and states " this is what is bothering me" and he also replies to my question what else seems to be bothering him today with " women"  He denies drug or alcohol use.  He has no other acute medical complaints.   External Medical Documents Reviewed: Behavioral health notes from January 2025      Physical Exam   Triage Vital Signs: ED Triage Vitals  Encounter Vitals Group     BP 04/05/23 0048 134/78     Systolic BP Percentile --      Diastolic BP Percentile --      Pulse Rate 04/05/23 0048 96     Resp 04/05/23 0048 18     Temp 04/05/23 0048 99.1 F (37.3 C)     Temp Source 04/05/23 0048 Oral     SpO2 04/05/23 0048 92 %     Weight --      Height 04/05/23 0049 5\' 6"  (1.676 m)     Head Circumference --      Peak Flow --      Pain Score 04/05/23 0049 0     Pain Loc --      Pain Education --      Exclude from Growth Chart --     Most recent vital signs: Vitals:   04/05/23 0048  BP: 134/78  Pulse: 96  Resp: 18  Temp: 99.1 F (37.3 C)  SpO2: 92%    General: Awake, no distress.  CV:  Good peripheral perfusion.  Resp:  Normal effort.  Abd:  No distention.  Other:  Awake alert comfortable appearing in no acute distress.  Vital signs normal.  Moving all extremities.  No obvious signs of head trauma.   ED Results / Procedures / Treatments   Labs (all labs ordered are listed, but only abnormal results are displayed) Labs Reviewed  SALICYLATE LEVEL - Abnormal; Notable for the following  components:      Result Value   Salicylate Lvl <7.0 (*)    All other components within normal limits  ACETAMINOPHEN LEVEL - Abnormal; Notable for the following components:   Acetaminophen (Tylenol), Serum <10 (*)    All other components within normal limits  COMPREHENSIVE METABOLIC PANEL WITH GFR  CBC  ETHANOL  URINE DRUG SCREEN, QUALITATIVE ([facility] ONLY)     I ordered and reviewed the above labs they are notable for cell counts electrolytes unremarkable  PROCEDURES:  Critical Care performed: No  Procedures   MEDICATIONS ORDERED IN ED: Medications  droperidol (INAPSINE) 2.5 MG/ML injection 5 mg (has no administration in time range)    IMPRESSION / MDM / ASSESSMENT AND PLAN / ED COURSE  I reviewed the triage vital signs and the nursing notes.                                Patient's presentation is most consistent with acute presentation with potential threat to life or  bodily function.  Differential diagnosis includes, but is not limited to, acute decompensated psychiatric illness, substance-induced mood disorder   The patient is on the cardiac monitor to evaluate for evidence of arrhythmia and/or significant heart rate changes.  MDM:    Is a patient with substance use and psychiatric history here with anxiety and bizarre behaviors, is anxious appearing, and at the initial evaluation prior to my arrival in the room he stood at the doorway posturing at staff, raising his voice stating "I am angry" he is agreeable to medications to help with his agitation, and I will administer a therapeutic dose of IM droperidol to facilitate further evaluation.  This behavior may be due to psychiatric illness versus substance-induced.  He is here voluntarily asking for psychiatric evaluation.  Check basic labs and toxicologic labs, within normal limits, medically clear for psychiatric evaluation.       FINAL CLINICAL IMPRESSION(S) / ED DIAGNOSES   Final diagnoses:  Anxiety      Rx / DC Orders   ED Discharge Orders     None        Note:  This document was prepared using Dragon voice recognition software and may include unintentional dictation errors.    Pilar Jarvis, MD 04/05/23 [phone]

## 2023-04-05 NOTE — ED Triage Notes (Signed)
 Pt to ed from home via [facility] voluntarily for "HTN and stress". Pt is alert and oriented and in no acute distress and ambulatory in triage. Pt denies SI/HI in triage.

## 2023-04-05 NOTE — ED Notes (Signed)
 Pt provided with breakfast tray.

## 2023-04-05 NOTE — ED Notes (Addendum)
 Attempted blood draw, unsuccessfull. Will call lab.

## 2023-04-05 NOTE — ED Provider Notes (Signed)
 Emergency department handoff note  Care of this patient was signed out to me at the end of the previous provider shift.  All pertinent patient information was conveyed and all questions were answered.  Patient pending psychiatric evaluation.  Prior to his evaluation, patient states that he is stabilized and wishes to be discharged at this time.  Patient currently denies any HI, SI, or AVH and has a safe ride home. The patient has been reexamined and is ready to be discharged.  All diagnostic results have been reviewed and discussed with the patient/family.  Care plan has been outlined and the patient/family understands all current diagnoses, results, and treatment plans.  There are no new complaints, changes, or physical findings at this time.  All questions have been addressed and answered.  Patient was instructed to, and agrees to follow-up with their primary care physician as well as return to the emergency department if any new or worsening symptoms develop.   Merwyn Katos, MD 04/05/23 [phone]

## 2023-04-05 NOTE — BH Assessment (Signed)
 Patient received agitation medications IM and continues to be asleep, unable to complete his assessment at this time.

## 2023-04-11 ENCOUNTER — Other Ambulatory Visit: Payer: Self-pay

## 2023-04-11 ENCOUNTER — Emergency Department
Admission: EM | Admit: 2023-04-11 | Discharge: 2023-04-12 | Disposition: A | Discharge status: Home / Self Care | Payer: Medicare (Managed Care) | Attending: Emergency Medicine | Admitting: Emergency Medicine

## 2023-04-11 DIAGNOSIS — Z79899 Other long term (current) drug therapy: Secondary | ICD-10-CM | POA: Insufficient documentation

## 2023-04-11 DIAGNOSIS — F1410 Cocaine abuse, uncomplicated: Secondary | ICD-10-CM | POA: Diagnosis not present

## 2023-04-11 DIAGNOSIS — F1590 Other stimulant use, unspecified, uncomplicated: Secondary | ICD-10-CM | POA: Diagnosis not present

## 2023-04-11 DIAGNOSIS — F1914 Other psychoactive substance abuse with psychoactive substance-induced mood disorder: Secondary | ICD-10-CM | POA: Insufficient documentation

## 2023-04-11 DIAGNOSIS — F1910 Other psychoactive substance abuse, uncomplicated: Secondary | ICD-10-CM | POA: Diagnosis present

## 2023-04-11 DIAGNOSIS — F1994 Other psychoactive substance use, unspecified with psychoactive substance-induced mood disorder: Secondary | ICD-10-CM

## 2023-04-11 NOTE — ED Triage Notes (Signed)
 Patient C/O stress and "needing help". Patient refusing to answer questions about what is bringing him in, and just continues to state that he needs to be seen. Patient requesting a full body scan and says irritation is causing him pain.

## 2023-04-12 DIAGNOSIS — F1590 Other stimulant use, unspecified, uncomplicated: Secondary | ICD-10-CM

## 2023-04-12 DIAGNOSIS — F1914 Other psychoactive substance abuse with psychoactive substance-induced mood disorder: Secondary | ICD-10-CM | POA: Diagnosis not present

## 2023-04-12 LAB — COMPREHENSIVE METABOLIC PANEL WITH GFR
ALT: 14 U/L (ref 0–44)
AST: 18 U/L (ref 15–41)
Albumin: 4.1 g/dL (ref 3.5–5.0)
Alkaline Phosphatase: 47 U/L (ref 38–126)
Anion gap: 9 (ref 5–15)
BUN: 12 mg/dL (ref 6–20)
CO2: 26 mmol/L (ref 22–32)
Calcium: 8.6 mg/dL — ABNORMAL LOW (ref 8.9–10.3)
Chloride: 103 mmol/L (ref 98–111)
Creatinine, Ser: 0.87 mg/dL (ref 0.61–1.24)
GFR, Estimated: >60 mL/min (ref >60)
Glucose, Bld: 105 mg/dL — ABNORMAL HIGH (ref 70–99)
Potassium: 3.5 mmol/L (ref 3.5–5.1)
Sodium: 138 mmol/L (ref 135–145)
Total Bilirubin: 0.5 mg/dL (ref 0.0–1.2)
Total Protein: 7.1 g/dL (ref 6.5–8.1)

## 2023-04-12 LAB — URINE DRUG SCREEN, QUALITATIVE (ARMC ONLY)
Amphetamines, Ur Screen: NOT DETECTED
Barbiturates, Ur Screen: NOT DETECTED
Benzodiazepine, Ur Scrn: NOT DETECTED
Cannabinoid 50 Ng, Ur ~~LOC~~: POSITIVE — AB
Cocaine Metabolite,Ur ~~LOC~~: POSITIVE — AB
MDMA (Ecstasy)Ur Screen: NOT DETECTED
Methadone Scn, Ur: NOT DETECTED
Opiate, Ur Screen: NOT DETECTED
Phencyclidine (PCP) Ur S: NOT DETECTED
Tricyclic, Ur Screen: NOT DETECTED

## 2023-04-12 LAB — CBC
HCT: 39.6 % (ref 39.0–52.0)
Hemoglobin: 13.5 g/dL (ref 13.0–17.0)
MCH: 28.9 pg (ref 26.0–34.0)
MCHC: 34.1 g/dL (ref 30.0–36.0)
MCV: 84.8 fL (ref 80.0–100.0)
Platelets: 331 10*3/uL (ref 150–400)
RBC: 4.67 MIL/uL (ref 4.22–5.81)
RDW: 13.7 % (ref 11.5–15.5)
WBC: 9.4 10*3/uL (ref 4.0–10.5)
nRBC: 0 % (ref 0.0–0.2)

## 2023-04-12 LAB — ACETAMINOPHEN LEVEL: Acetaminophen (Tylenol), Serum: <10 ug/mL — ABNORMAL LOW (ref 10–30)

## 2023-04-12 LAB — SALICYLATE LEVEL: Salicylate Lvl: <7 mg/dL — ABNORMAL LOW (ref 7.0–30.0)

## 2023-04-12 LAB — ETHANOL: Alcohol, Ethyl (B): <10 mg/dL (ref <10)

## 2023-04-12 MED ORDER — LORAZEPAM 2 MG PO TABS
2.0000 mg | ORAL_TABLET | Freq: Once | ORAL | Status: AC
  Administered 2023-04-12: 2 mg via ORAL
  Filled 2023-04-12: qty 1

## 2023-04-12 NOTE — ED Provider Notes (Signed)
 [facility] Provider Note    Event Date/Time   First MD Initiated Contact with Patient 04/11/23 2358     (approximate)   History   Psychiatric Evaluation   HPI Kenneth Jensen is a 33 y.o. male with known history of polysubstance abuse and paranoid schizophrenia.  He presents tonight saying that he needs to get away from all women and if he does not he may kill them or kill himself.  He is very frustrated but it is difficult for him to express exactly what has happened so upset.  He denies any recent drug use, "at least for a few days".  It is difficult for him to maintain a clear and consistent thought process to explain to me why he is so angry.  He has no medical complaints or concerns at this time.     Physical Exam   Triage Vital Signs: ED Triage Vitals  Encounter Vitals Group     BP 04/11/23 2330 (!) 139/91     Systolic BP Percentile --      Diastolic BP Percentile --      Pulse Rate 04/11/23 2330 83     Resp 04/11/23 2330 18     Temp 04/11/23 2330 98 F (36.7 C)     Temp Source 04/11/23 2330 Oral     SpO2 04/11/23 2330 97 %     Weight 04/11/23 2331 97 kg (213 lb 13.5 oz)     Height 04/11/23 2331 1.676 m (5\' 6" )     Head Circumference --      Peak Flow --      Pain Score --      Pain Loc --      Pain Education --      Exclude from Growth Chart --     Most recent vital signs: Vitals:   04/11/23 2330  BP: (!) 139/91  Pulse: 83  Resp: 18  Temp: 98 F (36.7 C)  SpO2: 97%    General: Awake, alert, agitated. CV:  Good peripheral perfusion.  Regular rate and rhythm. Resp:  Normal effort. Speaking easily and comfortably, no accessory muscle usage nor intercostal retractions.   Abd:  No distention.  Other:  Labile emotions, pressured speech.  Quick to anger but also will calm down quickly.  Expressing suicidal and homicidal thoughts about no one in particular, just "the women".   ED Results / Procedures / Treatments    Labs (all labs ordered are listed, but only abnormal results are displayed) Labs Reviewed  COMPREHENSIVE METABOLIC PANEL WITH GFR - Abnormal; Notable for the following components:      Result Value   Glucose, Bld 105 (*)    Calcium 8.6 (*)    All other components within normal limits  SALICYLATE LEVEL - Abnormal; Notable for the following components:   Salicylate Lvl <7.0 (*)    All other components within normal limits  ACETAMINOPHEN LEVEL - Abnormal; Notable for the following components:   Acetaminophen (Tylenol), Serum <10 (*)    All other components within normal limits  URINE DRUG SCREEN, QUALITATIVE ([facility] ONLY) - Abnormal; Notable for the following components:   Cocaine Metabolite,Ur [place] POSITIVE (*)    Cannabinoid 50 Ng, Ur [place] POSITIVE (*)    All other components within normal limits  ETHANOL  CBC       PROCEDURES:  Critical Care performed: No  Procedures    IMPRESSION / MDM / ASSESSMENT AND PLAN / ED COURSE  I reviewed the triage vital signs and the nursing notes.                              Differential diagnosis includes, but is not limited to, schizophrenia, substance-induced mood disorder, polysubstance abuse, bipolar disorder.  Patient's presentation is most consistent with acute presentation with potential threat to life or bodily function.  Labs/studies ordered: As per protocol, I ordered the following labs as part of the patient's medical and psychiatric evaluation:  CBC, CMP, ethanol level, acetaminophen level, salicylate level, urine drug screen.  Interventions/Medications given:  Medications  LORazepam (ATIVAN) tablet 2 mg (2 mg Oral Given 04/12/23 0135)    (Note:  hospital course my include additional interventions and/or labs/studies not listed above.)  Patient's presentation is consistent with prior presentations.  There may be an element of malingering as he has done this in the past and then decided to leave in the morning.  I think it is  unlikely he represents immediate danger to himself or others but he has a well-documented history of schizophrenia and is deserving of a psychiatric evaluation.  I gave him 2 mg of p.o. Ativan to help calm him down anticipating that cocaine may be playing a role in his current presentation (although he says he has not used cocaine in a few days).  The patient has been placed in psychiatric observation due to the need to provide a safe environment for the patient while obtaining psychiatric consultation and evaluation, as well as ongoing medical and medication management to treat the patient's condition.  The patient has not been placed under full IVC at this time.        FINAL CLINICAL IMPRESSION(S) / ED DIAGNOSES   Final diagnoses:  Polysubstance abuse (HCC)  Cocaine abuse (HCC)  Substance induced mood disorder (HCC)     Rx / DC Orders   ED Discharge Orders     None        Note:  This document was prepared using Dragon voice recognition software and may include unintentional dictation errors.   [name], MD 04/12/23 [phone]

## 2023-04-12 NOTE — BH Assessment (Signed)
 [facility] contacted this writer back, request was placed for telepsych consult, assessment currently pending.

## 2023-04-12 NOTE — ED Notes (Signed)
 Spoke to cousin Tanna Furry @ [phone], she stated "He's grown and has his own apartment right by yall, that's why he walks over there when he needs something." She stated that patient has ample outpatient resources as well as family resources. She stated that guardian listed does not have a working number she can provide.

## 2023-04-12 NOTE — ED Notes (Signed)
 Multiple attempts to reach Legal guardian listed on file unsuccessful. Staff spoke to family who stated that the number on file is not a working number and could not provide a working number.  Relative, Kenneth Jensen  @ [phone] stated Kenneth Jensen lives < 0.5Mi from the hospital and that she would go by to check on him as well. Pt denies current SI/HI stated that he has chronic auditory hallucinations however they seem to have subsided.  Pt requesting additional meals to "take to-go"  staff informed him that was not possible.  Pt verbalized understanding with AVS was reviewed, he was able to identify outside resources that were appropriate.  Pt left ambulatory

## 2023-04-12 NOTE — ED Notes (Signed)
 Attempted to reach Kenneth Jensen, Kenneth Jensen @  [phone]. No answer, no way to leave VM

## 2023-04-12 NOTE — ED Notes (Signed)
 RN introduced self to pt, pt did not respond and remained under blanket.  RN removed blanket from pt and pt began to speak in a loud voice.  RN explained to pt that he needs to participate in his care.  Pt did not speak.  RN explained to pt that this RN will be drawing pt blood, pt did not respond but did extend his arm.  RN obtained blood.  RN attempted to interview pt and perform an assessment.  Pt did not speak and would not participate.

## 2023-04-12 NOTE — ED Notes (Signed)
 Patient still sleeping, no snack provided at this time.

## 2023-04-12 NOTE — ED Notes (Signed)
 Patient to [facility] from main ED.  Patient oriented to cameras and rounding.  Patient only response was to ask this nurse to cover him with blankets.  RN covered pt with blankets and left room for patient to sleep.

## 2023-04-12 NOTE — Consult Note (Signed)
 [facility] Consult Note  Patient Name: Kenneth Jensen MRN: [id number] DOB: [birth year] DATE OF Consult: 04/12/2023  PRIMARY PSYCHIATRIC DIAGNOSES  1.  Stimulant use disorder 2.  Cannabis use disorder   RECOMMENDATIONS  Recommendations: Medication recommendations: Resume home medicationsSu Non-Medication/therapeutic recommendations: Supportive care, routine observation Is inpatient psychiatric hospitalization recommended for this patient? No (Explain why): Low risk of suicide or homicide Is another care setting recommended for this patient? (examples may include Crisis Stabilization Unit, Residential/Recovery Treatment, ALF/SNF, Memory Care Unit)  No (Explain why): Low risk of suicide or homicide From a psychiatric perspective, is this patient appropriate for discharge to an outpatient setting/resource or other less restrictive environment for continued care?  Yes (Explain why): Low risk of suicide or homicide Follow-Up Telepsychiatry C/L services: We will sign off for now. Please re-consult our service if needed for any concerning changes in the patient's condition, discharge planning, or questions. Communication: Treatment team members (and family members if applicable) who were involved in treatment/care discussions and planning, and with whom we spoke or engaged with via secure text/chat, include the following: Epic message treatment team  Thank you for involving [place] in the care of this patient. If you have any additional questions or concerns, please call [phone] and ask for me or the provider on-call.  TELEPSYCHIATRY ATTESTATION & CONSENT  As the provider for this telehealth consult, I attest that I verified the patient's identity using two separate identifiers, introduced myself to the patient, provided my credentials, disclosed my location, and performed this encounter via a HIPAA-compliant, real-time, face-to-face, two-way, interactive audio and video platform and with the  full consent and agreement of the patient (or guardian as applicable.)  Patient physical location: [place]. Telehealth provider physical location: home office in state of [place].  Video start time: 1000 AM (Central Time) Video end time: 1020 AM  (Central Time)  IDENTIFYING DATA  Kenneth Jensen is a 33 y.o. year-old male for whom a psychiatric consultation has been ordered by the primary provider. The patient was identified using two separate identifiers.  CHIEF COMPLAINT/REASON FOR CONSULT  Substance use  HISTORY OF PRESENT ILLNESS (HPI)  Psych consult requested for evaluation of 33 year old male with history of schizophrenia versus bipolar disorder and comorbid substance use presented to the ED on 4/5 with complaint of needing help, stating he needed to get away from some women or him to hurt himself at the women, was uncooperative.  UDS positive for cocaine and cannabinoids.  Multiple prior ED presentations for substance use, not compliant with outpatient treatment. During evaluation today patient is alert oriented calm cooperative and coherent.  Reports feeling better.  Denies psychiatric complaints.  Denies suicide homicidal ideation or thought of self-harm.  Denies perceptual disturbances no delusion elicited.  Reports he needs to stay in the ED to sleep more, eat lunch and can be discharged.  Endorse compliance with his medication, unable to state what medication he takes.  Patient is on Invega injection per his record.  Reports he had an injection few days ago.  PAST PSYCHIATRIC HISTORY  Schizophrenia versus bipolar disorder, multiple ED presentations, treatment noncompliance. Otherwise as per HPI above.  PAST MEDICAL HISTORY  Past Medical History:  Diagnosis Date   Asthma    Bipolar 1 disorder (HCC)    Depression    Schizophrenia (HCC)    Schizotaxia      HOME MEDICATIONS  Facility Ordered Medications  Medication   [COMPLETED] LORazepam (ATIVAN) tablet 2 mg   PTA Medications  Medication Sig   paliperidone (INVEGA SUSTENNA) 234 MG/1.5ML injection Inject 234 mg into the muscle every 28 (twenty-eight) days.   prazosin (MINIPRESS) 1 MG capsule Take 1 capsule (1 mg total) by mouth at bedtime.   traZODone (DESYREL) 50 MG tablet Take 1 tablet (50 mg total) by mouth at bedtime as needed for up to 14 days for sleep.   divalproex (DEPAKOTE ER) 500 MG 24 hr tablet Take 2 tablets (1,000 mg total) by mouth at bedtime for 14 days.   paliperidone (INVEGA) 3 MG 24 hr tablet Take 3 mg by mouth at bedtime.   benztropine (COGENTIN) 0.5 MG tablet Take 0.5 mg by mouth daily.   hydrOXYzine (ATARAX) 50 MG tablet Take 1 tablet (50 mg total) by mouth 3 (three) times daily as needed for anxiety. (Patient not taking: Reported on 04/12/2023)   nicotine polacrilex (NICORETTE) 2 MG gum Take 1 each (2 mg total) by mouth as needed for smoking cessation.   lidocaine (LIDODERM) 5 % Place 1 patch onto the skin daily as needed. Remove & Discard patch within 12 hours or as directed by MD (Patient not taking: Reported on 01/11/2023)   haloperidol (HALDOL) 10 MG tablet Take 10 mg by mouth 2 (two) times daily. (Patient not taking: Reported on 04/12/2023)     ALLERGIES  Allergies  Allergen Reactions   Penicillins Anaphylaxis   Amoxicillin Swelling   Percocet [Oxycodone-Acetaminophen]    Vicodin [Hydrocodone-Acetaminophen] Itching   Vicodin [Hydrocodone-Acetaminophen]     SOCIAL & SUBSTANCE USE HISTORY  Social History   Socioeconomic History   Marital status: Single    Spouse name: Not on file   Number of children: Not on file   Years of education: Not on file   Highest education level: Not on file  Occupational History   Not on file  Tobacco Use   Smoking status: Every Day    Current packs/day: 0.50    Types: Cigarettes   Smokeless tobacco: Never  Substance and Sexual Activity   Alcohol use: Yes    Comment: social   Drug use: Yes    Types: Marijuana, Cocaine    Comment: last marijuana use  09/02/22   Sexual activity: Not Currently  Other Topics Concern   Not on file  Social History Narrative   ** Merged History Encounter **       Social Drivers of Health   Financial Resource Strain: Unknown (01/30/2018)   Received from [facility], [facility]   Overall Financial Resource Strain (CARDIA)    Difficulty of Paying Living Expenses: Patient declined  Food Insecurity: Food Insecurity Present (09/04/2022)   Hunger Vital Sign    Worried About Running Out of Food in the Last Year: Sometimes true    Ran Out of Food in the Last Year: Sometimes true  Transportation Needs: Unmet Transportation Needs (09/04/2022)   PRAPARE - Transportation    Lack of Transportation (Medical): Yes    Lack of Transportation (Non-Medical): Yes  Physical Activity: Unknown (01/30/2018)   Received from [facility], [facility]   Exercise Vital Sign    Days of Exercise per Week: Patient declined    Minutes of Exercise per Session: Patient declined  Stress: Unknown (01/30/2018)   Received from [facility], [facility]   [employer] of Occupational Health - Occupational Stress Questionnaire    Feeling of Stress : Patient declined  Social Connections: Unknown (01/30/2018)   Received from  [facility]   Social Connection and Isolation Panel [NHANES]    Frequency of Communication with Friends and Family: Patient declined    Frequency of Social Gatherings with Friends and Family: Patient declined    Attends Religious Services: Patient declined    Database administrator or Organizations: Patient declined    Attends Engineer, structural: Patient declined    Marital Status: Patient declined   Social History   Tobacco Use  Smoking Status Every Day   Current packs/day: 0.50   Types: Cigarettes  Smokeless Tobacco Never   Social History    Substance and Sexual Activity  Alcohol Use Yes   Comment: social   Social History   Substance and Sexual Activity  Drug Use Yes   Types: Marijuana, Cocaine   Comment: last marijuana use 09/02/22    Additional pertinent information .  FAMILY HISTORY  No family history on file. Family Psychiatric History (if known):    MENTAL STATUS EXAM (MSE)  Mental Status Exam: General Appearance:  Hospital gown  Orientation:  Full (Time, Place, and Person)  Memory:   Intact  Concentration:  Concentration: Fair  Recall:  Fair  Attention  Fair  Eye Contact:  Fair  Speech:  Normal Rate  Language:  Fair  Volume:  Normal  Mood: better  Affect:  Appropriate  Thought Process:  Coherent  Thought Content:  Logical  Suicidal Thoughts:  No  Homicidal Thoughts:  No  Judgement:  Fair  Insight:  Fair  Psychomotor Activity:  Normal  Akathisia:  No  Fund of Knowledge:  Fair    Assets:  Manufacturing systems engineer Physical Health  Cognition:  WNL  ADL's:  Intact  AIMS (if indicated):       VITALS  Blood pressure (!) 139/91, pulse 83, temperature 98 F (36.7 C), temperature source Oral, resp. rate 18, height 5\' 6"  (1.676 m), weight 97 kg, SpO2 97%.  LABS  Admission on 04/11/2023  Component Date Value Ref Range Status   Sodium 04/12/2023 138  135 - 145 mmol/L Final   Potassium 04/12/2023 3.5  3.5 - 5.1 mmol/L Final   Chloride 04/12/2023 103  98 - 111 mmol/L Final   CO2 04/12/2023 26  22 - 32 mmol/L Final   Glucose, Bld 04/12/2023 105 (H)  70 - 99 mg/dL Final   Glucose reference range applies only to samples taken after fasting for at least 8 hours.   BUN 04/12/2023 12  6 - 20 mg/dL Final   Creatinine, Ser 04/12/2023 0.87  0.61 - 1.24 mg/dL Final   Calcium 60/45/4098 8.6 (L)  8.9 - 10.3 mg/dL Final   Total Protein 11/91/4782 7.1  6.5 - 8.1 g/dL Final   Albumin 95/62/1308 4.1  3.5 - 5.0 g/dL Final   AST 65/78/4696 18  15 - 41 U/L Final   ALT 04/12/2023 14  0 - 44 U/L Final   Alkaline  Phosphatase 04/12/2023 47  38 - 126 U/L Final   Total Bilirubin 04/12/2023 0.5  0.0 - 1.2 mg/dL Final   GFR, Estimated 04/12/2023 >60  >60 mL/min Final   Comment: (NOTE) Calculated using the CKD-EPI Creatinine Equation (2021)    Anion gap 04/12/2023 9  5 - 15 Final   Performed at [facility], [address] [place], [place] [address]   Alcohol, Ethyl (B) 04/12/2023 <10  <10 mg/dL Final   Comment: (NOTE) Lowest detectable limit for serum alcohol is 10 mg/dL.  For  medical purposes only. Performed at [facility], [address] [place], [place] [address]    Salicylate Lvl 04/12/2023 <7.0 (L)  7.0 - 30.0 mg/dL Final   Performed at [facility], [address] [place], [place] [address]   Acetaminophen (Tylenol), Serum 04/12/2023 <10 (L)  10 - 30 ug/mL Final   Comment: (NOTE) Therapeutic concentrations vary significantly. A range of 10-30 ug/mL  may be an effective concentration for many patients. However, some  are best treated at concentrations outside of this range. Acetaminophen concentrations >150 ug/mL at 4 hours after ingestion  and >50 ug/mL at 12 hours after ingestion are often associated with  toxic reactions.  Performed at [facility], [address] [place], [place] [address]    WBC 04/12/2023 9.4  4.0 - 10.5 K/uL Final   RBC 04/12/2023 4.67  4.22 - 5.81 MIL/uL Final   Hemoglobin 04/12/2023 13.5  13.0 - 17.0 g/dL Final   HCT 40/34/7425 39.6  39.0 - 52.0 % Final   MCV 04/12/2023 84.8  80.0 - 100.0 fL Final   MCH 04/12/2023 28.9  26.0 - 34.0 pg Final   MCHC 04/12/2023 34.1  30.0 - 36.0 g/dL Final   RDW 95/63/8756 13.7  11.5 - 15.5 % Final   Platelets 04/12/2023 331  150 - 400 K/uL Final   nRBC 04/12/2023 0.0  0.0 - 0.2 % Final   Performed at [facility], [address] [place], [place] [address]   Tricyclic, Ur Screen 04/11/2023 NONE DETECTED  NONE DETECTED Final   Amphetamines, Ur Screen 04/11/2023 NONE  DETECTED  NONE DETECTED Final   MDMA (Ecstasy)Ur Screen 04/11/2023 NONE DETECTED  NONE DETECTED Final   Cocaine Metabolite,Ur [place] 04/11/2023 POSITIVE (A)  NONE DETECTED Final   Opiate, Ur Screen 04/11/2023 NONE DETECTED  NONE DETECTED Final   Phencyclidine (PCP) Ur S 04/11/2023 NONE DETECTED  NONE DETECTED Final   Cannabinoid 50 Ng, Ur [place] 04/11/2023 POSITIVE (A)  NONE DETECTED Final   Barbiturates, Ur Screen 04/11/2023 NONE DETECTED  NONE DETECTED Final   Benzodiazepine, Ur Scrn 04/11/2023 NONE DETECTED  NONE DETECTED Final   Methadone Scn, Ur 04/11/2023 NONE DETECTED  NONE DETECTED Final   Comment: (NOTE) Tricyclics + metabolites, urine    Cutoff 1000 ng/mL Amphetamines + metabolites, urine  Cutoff 1000 ng/mL MDMA (Ecstasy), urine              Cutoff 500 ng/mL Cocaine Metabolite, urine          Cutoff 300 ng/mL Opiate + metabolites, urine        Cutoff 300 ng/mL Phencyclidine (PCP), urine         Cutoff 25 ng/mL Cannabinoid, urine                 Cutoff 50 ng/mL Barbiturates + metabolites, urine  Cutoff 200 ng/mL Benzodiazepine, urine              Cutoff 200 ng/mL Methadone, urine                   Cutoff 300 ng/mL  The urine drug screen provides only a preliminary, unconfirmed analytical test result and should not be used for non-medical purposes. Clinical consideration and professional judgment should be applied to any positive drug screen result due to possible interfering substances. A more specific alternate chemical method must be used in order to obtain a confirmed analytical result. Gas chromatography / mass spectrometry (GC/MS) is the preferred confirm  atory method. Performed at [facility], [address], [place], [place] [address]     PSYCHIATRIC REVIEW OF SYSTEMS (ROS)  ROS: Notable for the following relevant positive findings: ROS  Additional findings:      Musculoskeletal: No abnormal movements observed      Gait & Station:  Laying/Sitting      Pain Screening: Denies      Nutrition & Dental Concerns: none  RISK FORMULATION/ASSESSMENT  Is the patient experiencing any suicidal or homicidal ideations: No  Protective factors considered for safety management: Has supports  Risk factors/concerns considered for safety management:  Depression Substance abuse/dependence Male gender Unmarried  Is there a safety management plan with the patient and treatment team to minimize risk factors and promote protective factors: Yes           Explain: Outpatient management Is crisis care placement or psychiatric hospitalization recommended: No     Based on my current evaluation and risk assessment, patient is determined at this time to be at:  Low risk  *RISK ASSESSMENT Risk assessment is a dynamic process; it is possible that this patient's condition, and risk level, may change. This should be re-evaluated and managed over time as appropriate. Please re-consult psychiatric consult services if additional assistance is needed in terms of risk assessment and management. If your team decides to discharge this patient, please advise the patient how to best access emergency psychiatric services, or to call 911, if their condition worsens or they feel unsafe in any way.   Rocky Morel, MD Telepsychiatry Consult Services

## 2023-04-12 NOTE — ED Notes (Signed)
 Patient currently sleeping; Lunch tray and beverage left at bedside.

## 2023-04-12 NOTE — BH Assessment (Signed)
 This Clinical research associate attempted to assess the patient, Clinical research associate entered patient's room and called the patient's name twice but patient did not respond, writer attempted to engage with the patient again but patient refused to participate in the assessment "I don't feel like talking." TTS to attempt at a later time.

## 2023-04-12 NOTE — ED Notes (Signed)
 TTS in to speak with patient.

## 2023-04-12 NOTE — ED Notes (Signed)
 attempt at legal guardian listed:  Legal Guardian Demetra Shiner [phone]). No answer, phone rings then disconnects. Not available to LM

## 2023-04-12 NOTE — ED Provider Notes (Signed)
 Vitals:   04/11/23 2330  BP: (!) 139/91  Pulse: 83  Resp: 18  Temp: 98 F (36.7 C)  SpO2: 97%     Patient currently sleeping noted to be in no distress.  Current notes indicate telepsych assessment is pending   Sharyn Creamer, MD 04/12/23 [phone]

## 2023-04-12 NOTE — ED Notes (Signed)
 Patient Belongings: 1 pair of black sandals, 1 gray sock, 1 blue and black sock, 1 pair of orange pants, 1 pair of gray underwear, 1 white t-shirt, 1 dark blue hooded sweatshirt, 1 black cell phone with cracked screen noted, 1 black wallet, 1 black lanyard with key ring attached bagged into two separate bags.

## 2023-04-12 NOTE — ED Notes (Signed)
Vol /psych consult pending 

## 2023-04-12 NOTE — ED Provider Notes (Signed)
 Patient seen by Dr. Wyatt Portela of psychiatry who directly communicated via [facility] chat recommendation of:   "Psych recommendations Outpatient management Resume home medications Substance use rehab voluntary Please let me know if any question."     Sharyn Creamer, MD 04/12/23 1218

## 2023-04-12 NOTE — ED Notes (Signed)
 Pt saw telehealth provider, denied all SI/HI stated no A/V hallucination currently as he was awoken for interview.  Kenneth Jensen only request is to be "left alone so I can sleep some more"  staff w/ [facility] left from room. Cont to monitor as ordered

## 2023-04-12 NOTE — ED Notes (Addendum)
 2nd attempt at legal guardian listed:  Legal Guardian Kenneth Jensen [phone]). No answer, phone rings then disconnects. Not available to LM

## 2023-04-12 NOTE — ED Provider Notes (Signed)
 Attempts made by nursing to contact the patient's guardian.  Unable to contact guardian.  Patient is not noted to be residing in a group home or care home, at this time psychiatry is recommended that he may be discharged.   Sharyn Creamer, MD 04/12/23 1236

## 2023-04-12 NOTE — Discharge Instructions (Addendum)
You have been seen in the Emergency Department (ED) today for a psychiatric complaint.  You have been evaluated by psychiatry and we believe you are safe to be discharged from the hospital.   ° °Please return to the ED immediately if you have ANY thoughts of hurting yourself or anyone else, so that we may help you. ° °Please avoid alcohol and drug use. ° °Follow up with your doctor and/or therapist as soon as possible regarding today's ED visit.   Please follow up any other recommendations and clinic appointments provided by the psychiatry team that saw you in the Emergency Department. ° °

## 2023-04-12 NOTE — ED Provider Notes (Signed)
 Unable to reach guardian   Sharyn Creamer, MD 04/12/23 2010

## 2023-04-12 NOTE — BH Assessment (Signed)
 This writer attempted to contact [facility] twice to request telepsych assessment but no answer. A HIPAA complaint voicemail was left to return phone call

## 2023-04-12 NOTE — BH Assessment (Signed)
 Pt seen by psych and cleared for discharge. TTS made attempts 4x to contact patient's legal guardian listed in chart Kenneth Jensen: [phone]) with no answer. The line just rings and then goes busy - unable to leave a voicemail/no voicemail box setup.  MD/RN made aware of attempts.

## 2023-04-16 ENCOUNTER — Emergency Department
Admission: EM | Admit: 2023-04-16 | Discharge: 2023-04-17 | Disposition: A | Discharge status: Other Acute Inpatient Hospital | Payer: Medicare (Managed Care) | Attending: Emergency Medicine | Admitting: Emergency Medicine

## 2023-04-16 ENCOUNTER — Emergency Department: Payer: Medicare (Managed Care)

## 2023-04-16 ENCOUNTER — Other Ambulatory Visit: Payer: Self-pay

## 2023-04-16 DIAGNOSIS — F1410 Cocaine abuse, uncomplicated: Secondary | ICD-10-CM | POA: Diagnosis not present

## 2023-04-16 DIAGNOSIS — F1910 Other psychoactive substance abuse, uncomplicated: Secondary | ICD-10-CM | POA: Diagnosis not present

## 2023-04-16 DIAGNOSIS — F1210 Cannabis abuse, uncomplicated: Secondary | ICD-10-CM | POA: Diagnosis not present

## 2023-04-16 DIAGNOSIS — G4700 Insomnia, unspecified: Secondary | ICD-10-CM | POA: Insufficient documentation

## 2023-04-16 DIAGNOSIS — F200 Paranoid schizophrenia: Secondary | ICD-10-CM | POA: Diagnosis present

## 2023-04-16 DIAGNOSIS — Z720 Tobacco use: Secondary | ICD-10-CM | POA: Diagnosis not present

## 2023-04-16 DIAGNOSIS — M545 Low back pain, unspecified: Secondary | ICD-10-CM | POA: Insufficient documentation

## 2023-04-16 DIAGNOSIS — J45909 Unspecified asthma, uncomplicated: Secondary | ICD-10-CM | POA: Insufficient documentation

## 2023-04-16 DIAGNOSIS — R109 Unspecified abdominal pain: Secondary | ICD-10-CM | POA: Diagnosis not present

## 2023-04-16 DIAGNOSIS — R440 Auditory hallucinations: Secondary | ICD-10-CM | POA: Diagnosis present

## 2023-04-16 DIAGNOSIS — R45851 Suicidal ideations: Secondary | ICD-10-CM | POA: Diagnosis not present

## 2023-04-16 LAB — URINE DRUG SCREEN, QUALITATIVE (ARMC ONLY)
Amphetamines, Ur Screen: NOT DETECTED
Barbiturates, Ur Screen: NOT DETECTED
Benzodiazepine, Ur Scrn: NOT DETECTED
Cannabinoid 50 Ng, Ur ~~LOC~~: POSITIVE — AB
Cocaine Metabolite,Ur ~~LOC~~: POSITIVE — AB
MDMA (Ecstasy)Ur Screen: NOT DETECTED
Methadone Scn, Ur: NOT DETECTED
Opiate, Ur Screen: NOT DETECTED
Phencyclidine (PCP) Ur S: NOT DETECTED
Tricyclic, Ur Screen: NOT DETECTED

## 2023-04-16 MED ORDER — FAMOTIDINE 20 MG PO TABS
20.0000 mg | ORAL_TABLET | Freq: Once | ORAL | Status: AC
  Administered 2023-04-16: 20 mg via ORAL
  Filled 2023-04-16: qty 1

## 2023-04-16 MED ORDER — HALOPERIDOL LACTATE 5 MG/ML IJ SOLN: 5.0000 mg | Freq: Once | INTRAMUSCULAR | Status: DC

## 2023-04-16 MED ORDER — HALOPERIDOL 5 MG PO TABS
5.0000 mg | ORAL_TABLET | Freq: Once | ORAL | Status: AC
  Administered 2023-04-16: 5 mg via ORAL
  Filled 2023-04-16: qty 1

## 2023-04-16 MED ORDER — LORATADINE 10 MG PO TABS
10.0000 mg | ORAL_TABLET | Freq: Once | ORAL | Status: AC
  Administered 2023-04-16: 10 mg via ORAL
  Filled 2023-04-16: qty 1

## 2023-04-16 MED ORDER — HYDROXYZINE HCL 25 MG PO TABS
25.0000 mg | ORAL_TABLET | Freq: Once | ORAL | Status: AC
Start: 2023-04-16 — End: 2023-04-16
  Administered 2023-04-16: 25 mg via ORAL
  Filled 2023-04-16: qty 1

## 2023-04-16 MED ORDER — IBUPROFEN 800 MG PO TABS
400.0000 mg | ORAL_TABLET | Freq: Once | ORAL | Status: AC
  Administered 2023-04-16: 400 mg via ORAL
  Filled 2023-04-16: qty 1

## 2023-04-16 MED ORDER — ALUM & MAG HYDROXIDE-SIMETH 200-200-20 MG/5ML PO SUSP
15.0000 mL | Freq: Once | ORAL | Status: AC
  Administered 2023-04-16: 15 mL via ORAL
  Filled 2023-04-16: qty 30

## 2023-04-16 NOTE — ED Notes (Signed)
 This RN approached pt with hospital clothing for pt to dress out. Pt states that he is not dressing out, and wants to go home. Clothing given to PT's ACT team member, member currently speaking with pt at this time.

## 2023-04-16 NOTE — ED Notes (Signed)
 Pt denies SI/HI/AVH on assessment. Had to be prompted several times before he'd answer the assessment questions, kept saying he wants to be left alone to just sleep.

## 2023-04-16 NOTE — ED Notes (Signed)
 Pt observed resting quietly with his eyes closed, resp even and unlabored, no distress noted

## 2023-04-16 NOTE — BH Assessment (Signed)
 Patient has been accepted to [facility].  Patient assigned to Adult [facility] Accepting physician is Dr. Clifton James.  Call report to [phone] option 2.  Representative was [employer].   ER Staff is aware of it:  Cordelia Pen, ER Josephine Cables, Patient's Nurse      Address: [address] [place], [place] [address]  Bed will be available tomorrow (04/17/2023) after 8am.

## 2023-04-16 NOTE — BH Assessment (Signed)
 Per [facility] ([place]), patient to be referred out of system.  Referral information for Psychiatric Hospitalization faxed to;   [facility] [phone]- [phone]), No appropriate bed  Destination  Service Provider Phone  [facility] [phone]  [facility] [phone]  [facility] [phone]  [facility] [phone]  [facility] [phone]  [facility] [phone]  [facility] [phone]  [facility] [phone]  [place] EFAX [phone]  [facility] [phone]  [facility] [phone]  [facility] [phone]  [facility] [phone]

## 2023-04-16 NOTE — ED Notes (Addendum)
 Pt currently on phone with ACT Team member. This Rn spoke with the ACT team member, she stated that he does this on a regular bases. She states that there is nothing that can be done for him at this time on their end. MD Quale notified that ACT team has been contacted.

## 2023-04-16 NOTE — ED Notes (Signed)
Pt observed resting quietly, eyes closed, resp even and unlabored 

## 2023-04-16 NOTE — ED Notes (Signed)
 [facility] member here speaking with patient at this time

## 2023-04-16 NOTE — ED Provider Notes (Addendum)
 [facility] Emergency Department Provider Note     None    (approximate)   History   Back Pain   HPI  Kenneth Jensen is a 33 y.o. male with a history of schizophrenia, bipolar disorder, depression, and asthma, presents to the ED for evaluation of back pain.  He presents via EMS from his group home endorsing 2 days since his last bowel movement.  He is also endorsing nontraumatic low back pain.  No fevers, chills, or sweats reported. He reports he taken his meds as prescribed. He has flight of thoughts, auditory hallucinations; reporting hearing voices telling him about his friend. He is [facility]  He denies SI/HI at this point.   Physical Exam   Triage Vital Signs: ED Triage Vitals  Encounter Vitals Group     BP 04/16/23 0212 (!) 138/93     Systolic BP Percentile --      Diastolic BP Percentile --      Pulse Rate 04/16/23 0212 74     Resp 04/16/23 0212 16     Temp 04/16/23 0212 97.6 F (36.4 C)     Temp Source 04/16/23 0212 Oral     SpO2 04/16/23 0212 96 %     Weight 04/16/23 0211 200 lb (90.7 kg)     Height 04/16/23 0211 5\' 1"  (1.549 m)     Head Circumference --      Peak Flow --      Pain Score 04/16/23 0211 8     Pain Loc --      Pain Education --      Exclude from Growth Chart --     Most recent vital signs: Vitals:   04/16/23 0212 04/16/23 0736  BP: (!) 138/93 130/88  Pulse: 74 70  Resp: 16 16  Temp: 97.6 F (36.4 C) 98 F (36.7 C)  SpO2: 96% 96%    General Awake, no distress. Anxious and assertive in asking to be left alone. HEENT NCAT. PERRL. EOMI. No rhinorrhea. Mucous membranes are moist.  CV:  Good peripheral perfusion. RRR RESP:  Normal effort. CTA ABD:  No distention. Soft, nontender. No rebound, guarding, or rigidity MSK:  AROM all extremities.  PSYCH: Scattered thoughts and flight of ideas.    ED Results / Procedures / Treatments   Labs (all labs ordered are listed,  but only abnormal results are displayed) Labs Reviewed  URINE DRUG SCREEN, QUALITATIVE ([facility] ONLY)     EKG   RADIOLOGY  I personally viewed and evaluated these images as part of my medical decision making, as well as reviewing the written report by the radiologist.  ED Provider Interpretation: No acute findings.  No constipation or SBO  DG Abdomen 1 View Result Date: 04/16/2023 CLINICAL DATA:  constipation EXAM: ABDOMEN - 1 VIEW COMPARISON:  X-ray abdomen 09/04/2018 FINDINGS: The bowel gas pattern is normal. No increased stool burden. No radio-opaque calculi or other significant radiographic abnormality are seen. IMPRESSION: Nonobstructive bowel gas pattern. Electronically Signed   By: Tish Frederickson M.D.   On: 04/16/2023 03:59     PROCEDURES:  Critical Care performed: No  Procedures   MEDICATIONS ORDERED IN ED: Medications - No data to display   IMPRESSION / MDM / ASSESSMENT AND PLAN / ED COURSE  I reviewed the triage vital signs and the nursing notes.  Differential diagnosis includes, but is not limited to, SBO, constipation, gastroenteritis, lumbar strain, lumbar myalgia, lumbar radiculopathy  Patient's presentation is most consistent with acute presentation with potential threat to life or bodily function.  Patient's diagnosis is consistent with back pain and abdominal pain.  Patient reports last bowel movement was 2 days prior.  He is initially reported that he took his psych meds at home as prescribed, then later reports that he has missed his meds at least 1 day.  Patient is requesting evaluation by behavioral health at this time.  He is denying any SI/HI, but does endorse hearing voices that are telling him that he should not trust his friend.    Patient care transferred at this time to my attending on the main side of the ED.  Patient will be transferred to the behavioral unit for further evaluation by the TTS team.   FINAL CLINICAL  IMPRESSION(S) / ED DIAGNOSES   Final diagnoses:  Acute low back pain without sciatica, unspecified back pain laterality  Paranoid schizophrenia (HCC)     Rx / DC Orders   ED Discharge Orders     None        Note:  This document was prepared using Dragon voice recognition software and may include unintentional dictation errors.    Lissa Hoard, PA-C 04/16/23 [address] Charlesetta Ivory, PA-C 04/16/23 1037    Sharyn Creamer, MD 04/16/23 [phone]

## 2023-04-16 NOTE — ED Notes (Signed)
 Patient just woke up, and is currently yelling profanities. Pt stating that he has been here all morning, and no one is helping him. This RN informed patient that he has been asleep, and that we will get him medication as soon as possible. Pt continues to yell profanities at this time.

## 2023-04-16 NOTE — ED Provider Notes (Addendum)
 Spoke with Mr. Pascal Lux, patient's guardian.  Advised the patient has an ACT team.  Agreeable with plan to have our psychiatry team evaluate the patient, I have also requested that they coordinate with the ACT team.  Guardian advises patient may discharge to [address] [place] where patient resides   Sharyn Creamer, MD 04/16/23 0908     ----------------------------------------- 1:10 PM on 04/16/2023 ----------------------------------------- Patient had been asleep, woke up advises he would like something for his stomach.  Reports he has been having some upper abdominal discomfort intermittently.  Reassuring clinical exam and imaging.  He is fully awake and alert.  Will give Maalox and Awilda Bill, MD 04/16/23 1310

## 2023-04-16 NOTE — ED Notes (Signed)
 2 attempts made to contact [name], NP via secure chat and telephone

## 2023-04-16 NOTE — Consult Note (Signed)
 [facility] Psychiatric Consult Initial  Patient Name: .Kenneth Jensen  MRN: [id number]  DOB: [birth year]  Consult Order details:  Orders (From admission, onward)     Start     Ordered   04/16/23 0906  CONSULT TO CALL ACT TEAM       Comments: Please contact [facility] crisis line at [phone]  Ordering Provider: Sharyn Creamer, MD  Provider:  (Not yet assigned)  Question:  Reason for Consult?  Answer:  patient reports anger issues, feels his mental health is deteriorating   04/16/23 0905   04/16/23 0835  IP CONSULT TO PSYCHIATRY       Ordering Provider: Sharyn Creamer, MD  Provider:  (Not yet assigned)  Question Answer Comment  Place call to: psych team md np   Reason for Consult Consult   Diagnosis/Clinical Info for Consult: patient reports anger issues, feels his mental health is deteriorating      04/16/23 0835             Mode of Visit: In person    Psychiatry Consult Evaluation  Service Date: April 16, 2023 LOS:  LOS: 0 days  Chief Complaint "I hear voices telling me to kill myself"  Primary Psychiatric Diagnoses  Paranoid Schizophrenia 2.  Substance abuse 3.  Suicidal ideations  Assessment  Kenneth Jensen is a 33 y.o. male admitted: Presented to the ED this morning  with chief complaint of hallucinations. Marland Kitchen He carries the psychiatric diagnoses of Schizophrenia and has no known medical history.  His current presentation with paranoid thinking/agitations  is most consistent with disturbed thought process. He meets criteria for Inpatient based on his current symptoms.  Current outpatient psychotropic medications include  Haldol, Invega, Seroquel, Prazosin and Trazodone and historically he has had a significant  response to these medications. He was  compliant with medications prior to admission as evidenced by his ability to care for himself at home. On initial examination, patient Endorses active hallucinations, agitations and suicidal ideations. Please see plan below  for detailed recommendations.   Diagnoses:  Active Hospital problems: Active Problems:   Auditory hallucinations   Suicidal ideations    Plan   ## Psychiatric Medication Recommendations:  NA  ## Medical Decision Making Capacity: Not specifically addressed in this encounter  ## Further Work-up:  -- UDS UDS -- most recent EKG on NA had QtC of NA -- Pertinent labwork reviewed earlier this admission includes: NA   ## Disposition:-- We recommend inpatient psychiatric hospitalization when medically cleared. Patient is under voluntary admission status at this time; please IVC if attempts to leave hospital.  ## Behavioral / Environmental: - No specific recommendations at this time.     ## Safety and Observation Level:  - Based on my clinical evaluation, I estimate the patient to be at  high  risk of self harm in the current setting. - At this time, we recommend  routine safety precautions. This decision is based on my review of the chart including patient's history and current presentation, interview of the patient, mental status examination, and consideration of suicide risk including evaluating suicidal ideation, plan, intent, suicidal or self-harm behaviors, risk factors, and protective factors. This judgment is based on our ability to directly address suicide risk, implement suicide prevention strategies, and develop a safety plan while the patient is in the clinical setting. Please contact our team if there is a concern that risk level has changed.  CSSR Risk Category:C-SSRS RISK CATEGORY: Error: Q2 is Yes, you must answer  3, 4, and 5  Suicide Risk Assessment: Patient has following modifiable risk factors for suicide: active suicidal ideation, CAH with suicidal content, and medication noncompliance, which we are addressing by Patient. Patient has following non-modifiable or demographic risk factors for suicide: male gender, history of suicide attempt, and psychiatric  hospitalization Patient has the following protective factors against suicide: Cultural, spiritual, or religious beliefs that discourage suicide  Thank you for this consult request. Recommendations have been communicated to the primary team.  We will recommend admission to Inpatient at this time.   Kenneth Pia, NP       History of Present Illness  Relevant Aspects of Hospital ED Course:  Admitted on 04/16/2023 for hallucinations/suicidal ideations.  Patient Report:  Hx of suicide attempt  Psych ROS:  Depression: current Anxiety:  Current Mania (lifetime and current): NA Psychosis: (lifetime and current): hx and current  Collateral information:  Contacted NA at NA on NA  Review of Systems  Constitutional: Negative.   HENT: Negative.    Eyes: Negative.   Respiratory: Negative.    Cardiovascular: Negative.   Gastrointestinal:  Positive for abdominal pain.  Genitourinary: Negative.   Musculoskeletal: Negative.   Skin: Negative.   Neurological: Negative.   Endo/Heme/Allergies: Negative.   Psychiatric/Behavioral:  Positive for depression, hallucinations, substance abuse and suicidal ideas. The patient is nervous/anxious and has insomnia.      Psychiatric and Social History  Psychiatric History:  Information collected from Patient  Prev Dx/Sx: Schizophrenia Current Psych Provider: NA Home Meds (current): Haldol, Invega, Seroquel, Prazosin, Trazodone Previous Med Trials: NA Therapy: NA  Prior Psych Hospitalization: Multiple  Prior Self Harm: Reported Prior Violence: Reported  Family Psych History: NA Family Hx suicide: NA  Social History:  Developmental Hx: Unknown Educational Hx: Not reported Occupational Hx: Not reported Legal Hx: Not reported Living Situation: Lives alone. Reports not having support.  Ptient has ACT team ([facility] [phone]) Has a legal guardian Kenneth Jensen [phone] Spiritual Hx: NA Access to weapons/lethal means: Denies    Substance History Alcohol: not recent  Type of alcohol NA Last Drink NA Number of drinks per day NA History of alcohol withdrawal seizures NA History of DT's NA Tobacco: NA Illicit drugs: Crack/Cocaine, THC Prescription drug abuse: Denies Rehab hx: NA  Exam Findings  Physical Exam: NA Vital Signs:  Temp:  [97.6 F (36.4 C)-98 F (36.7 C)] 98 F (36.7 C) (04/10 0736) Pulse Rate:  [70-74] 70 (04/10 0736) Resp:  [16] 16 (04/10 0736) BP: (130-138)/(88-93) 130/88 (04/10 0736) SpO2:  [96 %] 96 % (04/10 0736) Weight:  [90.7 kg] 90.7 kg (04/10 0211) Blood pressure 130/88, pulse 70, temperature 98 F (36.7 C), temperature source Oral, resp. rate 16, height 5\' 1"  (1.549 m), weight 90.7 kg, SpO2 96%. Body mass index is 37.79 kg/m.  Physical Exam Vitals and nursing note reviewed.  Constitutional:      General: He is in acute distress.  HENT:     Head: Normocephalic and atraumatic.     Right Ear: Tympanic membrane normal.     Left Ear: Tympanic membrane normal.     Nose: Nose normal.     Mouth/Throat:     Mouth: Mucous membranes are moist.  Eyes:     Extraocular Movements: Extraocular movements intact.     Pupils: Pupils are equal, round, and reactive to light.  Cardiovascular:     Rate and Rhythm: Normal rate.     Pulses: Normal pulses.  Pulmonary:     Effort: Pulmonary effort  is normal.  Musculoskeletal:        General: Normal range of motion.     Cervical back: Neck supple.  Neurological:     General: No focal deficit present.     Mental Status: He is alert and oriented to person, place, and time.     Mental Status Exam: General Appearance: Disheveled  Orientation:  Full (Time, Place, and Person)  Memory:  Immediate;   Fair Recent;   Fair  Concentration:  Concentration: Poor  Recall:  Fair  Attention  Fair  Eye Contact:  Poor  Speech:  Pressured  Language:  Fair  Volume:  Increased  Mood: Angry, irritable  Affect:  Depressed  Thought Process:   Irrelevant  Thought Content:  Hallucinations: Auditory, Obsessions, and Paranoid Ideation  Suicidal Thoughts:  Yes.  without intent/plan  Homicidal Thoughts:  No  Judgement:  Poor  Insight:  Fair  Psychomotor Activity:  Restlessness  Akathisia:  No  Fund of Knowledge:  Fair      Assets:  Communication Skills Desire for Improvement Physical Health  Cognition:  WNL  ADL's:  Intact  AIMS (if indicated):        Other History   These have been pulled in through the EMR, reviewed, and updated if appropriate.  Family History:  The patient's family history is not on file.  Medical History: Past Medical History:  Diagnosis Date  . Asthma   . Bipolar 1 disorder (HCC)   . Depression   . Schizophrenia (HCC)   . Schizotaxia     Surgical History: History reviewed. No pertinent surgical history.   Medications:   Current Facility-Administered Medications:  .  alum & mag hydroxide-simeth (MAALOX/MYLANTA) 200-200-20 MG/5ML suspension 15 mL, 15 mL, Oral, Once, Sharyn Creamer, MD .  famotidine (PEPCID) tablet 20 mg, 20 mg, Oral, Once, Sharyn Creamer, MD  Current Outpatient Medications:  .  benztropine (COGENTIN) 0.5 MG tablet, Take 0.5 mg by mouth daily., Disp: , Rfl:  .  divalproex (DEPAKOTE ER) 500 MG 24 hr tablet, Take 2 tablets (1,000 mg total) by mouth at bedtime for 14 days., Disp: 28 tablet, Rfl: 0 .  haloperidol (HALDOL) 10 MG tablet, Take 10 mg by mouth 2 (two) times daily. (Patient not taking: Reported on 04/12/2023), Disp: , Rfl:  .  hydrOXYzine (ATARAX) 50 MG tablet, Take 1 tablet (50 mg total) by mouth 3 (three) times daily as needed for anxiety. (Patient not taking: Reported on 04/12/2023), Disp: 14 tablet, Rfl: 0 .  lidocaine (LIDODERM) 5 %, Place 1 patch onto the skin daily as needed. Remove & Discard patch within 12 hours or as directed by MD (Patient not taking: Reported on 01/11/2023), Disp: , Rfl:  .  nicotine polacrilex (NICORETTE) 2 MG gum, Take 1 each (2 mg total) by mouth as  needed for smoking cessation., Disp: 100 tablet, Rfl: 0 .  paliperidone (INVEGA SUSTENNA) 234 MG/1.5ML injection, Inject 234 mg into the muscle every 28 (twenty-eight) days., Disp: 1.8 mL, Rfl: 1 .  paliperidone (INVEGA) 3 MG 24 hr tablet, Take 3 mg by mouth at bedtime., Disp: , Rfl:  .  prazosin (MINIPRESS) 1 MG capsule, Take 1 capsule (1 mg total) by mouth at bedtime., Disp: 14 capsule, Rfl: 0 .  QUEtiapine (SEROQUEL) 200 MG tablet, Take 1 tablet (200 mg total) by mouth at bedtime for 14 days. (Patient not taking: Reported on 04/12/2023), Disp: 14 tablet, Rfl: 0 .  traZODone (DESYREL) 50 MG tablet, Take 1 tablet (50 mg  total) by mouth at bedtime as needed for up to 14 days for sleep., Disp: 14 tablet, Rfl: 0  Allergies: Allergies  Allergen Reactions  . Penicillins Anaphylaxis  . Amoxicillin Swelling  . Percocet [Oxycodone-Acetaminophen]   . Vicodin [Hydrocodone-Acetaminophen] Itching  . Vicodin [Hydrocodone-Acetaminophen]     Kenneth Pia, NP

## 2023-04-16 NOTE — ED Provider Notes (Addendum)
 Spoke with the patient's legal guardian Mr. Kenneth Jensen.  Mr. Kenneth Jensen is also spoken with his [facility] ACT team who is currently at the bedside.  Patient's guardian is not currently able to take out IVC papers due to logistics and distance.  Patient has voice suicidal ideation earlier now reports he is no longer suicidal and wants to go to his cousin's house but his guardian does not feel that he is necessarily in a safe situation for discharge and does request that we attempt to IVC the patient.  Based on the patient's previous history of mental health, substance abuse, and earlier voicing suicidal ideation and a history of mental health I will request involuntary commitment until psychiatry can further evaluate and delineate a safe plan of care.   Sharyn Creamer, MD 04/16/23 1523   ----------------------------------------- 3:39 PM on 04/16/2023 ----------------------------------------- Involuntary commitment paperwork completed for sending magistrate.  Dr. Derrill Kay to follow-up on ongoing care and psychiatry consult recommendation    Sharyn Creamer, MD 04/16/23 1539

## 2023-04-16 NOTE — ED Notes (Signed)
 Patient belongings:  1 Jacket 1 Pair of shoes 1 Wallet 1 Set of Keys 1 phone 1 charger  1 Shirt 1 Pair of pants 1 Undershirt 1 Pair of underwear 1 Pair of socks

## 2023-04-16 NOTE — ED Notes (Signed)
 Pt ambulatory to bathroom with no assistance

## 2023-04-16 NOTE — ED Triage Notes (Signed)
 Pt to ED via EMS from home, pt reports he has lower back pain and he is constipated. Pt reports last BM 2 days ago.

## 2023-04-16 NOTE — ED Notes (Signed)
 IVC/Accepted to [facility] in AM

## 2023-04-16 NOTE — ED Provider Notes (Signed)
 Send awaiting voluntary psych consult.  Denies any suicidal ideation.  On triage she reported yes to all suicidal questions, but currently denies.  Act team is also been in contact.  Awaiting psychiatry consult.  Voluntary.  Given Claritin as he reports he has seasonal allergies   Sharyn Creamer, MD 04/16/23 1430

## 2023-04-16 NOTE — ED Notes (Signed)
 Patient complains of a stuffy nose and allergy symptoms at this time.

## 2023-04-16 NOTE — ED Notes (Signed)
 This Rn approached pt with ACT team member with clothing to have pt to dress out in hospital attire. Pt states that he wants to go home and does not want to dress out. ACT

## 2023-04-16 NOTE — ED Provider Notes (Signed)
 Patient resting in hallway in no distress.  He is requesting that he would like to speak to a psychiatrist.  Reports that has been having trouble last few days with "people".  He does not have any specific threats but reports that people are making him angry easily.  Denies any to anything to harm anyone.  Does not want harm himself.  He does however report that he feels like he needs psychiatric help.  He is awake alert oriented.  In no acute distress.  Have placed for voluntary psychiatry consult which patient is requesting.  He is not making or communicating specific threats to harm himself or others.  He is able to speak coherently, and does not seem to be experiencing any signs or symptoms of obvious mania at this point   Sharyn Creamer, MD 04/16/23 [phone]

## 2023-04-16 NOTE — ED Notes (Signed)
 Pt informed that he needs to provide a urine sample. Pt states he did that last week already. Educated that UDS has been ordered for this admission. Pt replied he just wants to go to sleep for right now and he's not trying to get locked up. Pt educated that this is the hospital and not jail, no one will lock him up here. Pt then says he'll give it once he needs to pee.

## 2023-04-16 NOTE — BH Assessment (Signed)
 Comprehensive Clinical Assessment (CCA) Note  04/16/2023 Kenneth Jensen [id number]  Chief Complaint:  Chief Complaint  Patient presents with   Back Pain   Visit Diagnosis: Schizophrenia  Kenneth Jensen is a 33 year old male who presents to the ER due to hearing voices telling him to end his life and they he hasn't accomplish anything. He further states, he is taking his medications "the best of my abilities." He admits he isn't taking as prescribed. He reports he is contemplating ending his life due to the voices. During the interview, he was calm, cooperative and polite.  CCA Screening, Triage and Referral (STR)  Patient Reported Information How did you hear about [place]? Self  What Is the Reason for Your Visit/Call Today? Patient reports of having AV/H, which has caused him to have SI.  How Long Has This Been Causing You Problems? 1 wk - 1 month  What Do You Feel Would Help You the Most Today? Treatment for Depression or other mood problem   Have You Recently Had Any Thoughts About Hurting Yourself? Yes  Are You Planning to Commit Suicide/Harm Yourself At This time? Yes   Flowsheet Row ED from 04/16/2023 in [facility] Emergency Department at [facility] ED from 04/11/2023 in [facility] Emergency Department at [facility] ED from 04/05/2023 in [facility] Emergency Department at [facility]  C-SSRS RISK CATEGORY Error: Q2 is Yes, you must answer 3, 4, and 5 High Risk No Risk       Have you Recently Had Thoughts About Hurting Someone [name]? No  Are You Planning to Harm Someone at This Time? No  Explanation: Pt denied.   Have You Used Any Alcohol or Drugs in the Past 24 Hours? No  How Long Ago Did You Use Drugs or Alcohol? No data recorded What Did You Use and How Much? Alcohol; amount unknown   Do You Currently Have a Therapist/Psychiatrist? No  Name of Therapist/Psychiatrist:    Have You Been Recently Discharged From Any Office Practice or  Programs? No  Explanation of Discharge From Practice/Program: n/a     CCA Screening Triage Referral Assessment Type of Contact: Face-to-Face  Telemedicine Service Delivery:   Is this Initial or Reassessment?   Date Telepsych consult ordered in [facility]:    Time Telepsych consult ordered in [facility]:    Location of Assessment: [facility] ED  Provider Location: [facility] ED   Collateral Involvement: Dariusz, mental health crisis act team lead [phone])   Does Patient Have a Court Appointed Legal Guardian? Yes Other:  Legal Guardian Contact Information: Theresia Majors  Copy of Legal Guardianship Form: No - copy requested  Legal Guardian Notified of Arrival: Attempted notification unsuccessful  Legal Guardian Notified of Pending Discharge: Attempted notification unsuccessful  If Minor and Not Living with Parent(s), Who has Custody? n/a  Is CPS involved or ever been involved? Never  Is APS involved or ever been involved? Never   Patient Determined To Be At Risk for Harm To Self or Others Based on Review of Patient Reported Information or Presenting Complaint? Yes, for Self-Harm  Method: No Plan  Availability of Means: No access or NA  Intent: Vague intent or NA  Notification Required: No need or identified person  Additional Information for Danger to Others Potential: Active psychosis  Additional Comments for Danger to Others Potential: n/a  Are There Guns or Other Weapons in Your Home? No  Types of Guns/Weapons: n/a  Are These Weapons Safely Secured?  No  Who Could Verify You Are Able To Have These Secured: n/a  Do You Have any Outstanding Charges, Pending Court Dates, Parole/Probation? None reported  Contacted To Inform of Risk of Harm To Self or Others: Other: Comment    Does Patient Present under Involuntary Commitment? No    [place] of Residence: [place]   Patient Currently Receiving the Following Services: Not Receiving Services (Per  the patient)   Determination of Need: Emergent (2 hours)   Options For Referral: ED Visit     CCA Biopsychosocial Patient Reported Schizophrenia/Schizoaffective Diagnosis in Past: Yes   Strengths: Have some insight, seeking help and able to communicate needs.   Mental Health Symptoms Depression:  Difficulty Concentrating; Change in energy/activity   Duration of Depressive symptoms: Duration of Depressive Symptoms: Greater than two weeks   Mania:  N/A   Anxiety:   N/A   Psychosis:  Hallucinations   Duration of Psychotic symptoms: Duration of Psychotic Symptoms: N/A   Trauma:  N/A   Obsessions:  N/A   Compulsions:  N/A   Inattention:  N/A   Hyperactivity/Impulsivity:  N/A   Oppositional/Defiant Behaviors:  N/A   Emotional Irregularity:  N/A   Other Mood/Personality Symptoms:  n/a    Mental Status Exam Appearance and self-care  Stature:  Small   Weight:  Average weight   Clothing:  Neat/clean; Age-appropriate   Grooming:  Normal   Cosmetic use:  None   Posture/gait:  Normal   Motor activity:  -- (Within normal range)   Sensorium  Attention:  Distractible   Concentration:  Preoccupied   Orientation:  X5   Recall/memory:  Normal   Affect and Mood  Affect:  Appropriate; Anxious   Mood:  Anxious; Depressed   Relating  Eye contact:  Normal   Facial expression:  Depressed   Attitude toward examiner:  Cooperative   Thought and Language  Speech flow: Clear and Coherent   Thought content:  Delusions   Preoccupation:  Other (Comment)   Hallucinations:  Auditory; Visual   Organization:  Irrelevant; Intact   [employer] of Knowledge:  Fair   Intelligence:  Average   Abstraction:  Armed forces technical officer:  Impaired   Reality Testing:  Distorted   Insight:  Denial   Decision Making:  Impulsive   Social Functioning  Social Maturity:  Impulsive; Self-centered   Social Judgement:  Heedless; "Street Smart"    Stress  Stressors:  Other (Comment)   Coping Ability:  Deficient supports   Skill Deficits:  Decision making   Supports:  Support needed     Religion: Religion/Spirituality Are You A Religious Person?: No  Leisure/Recreation: Leisure / Recreation Do You Have Hobbies?: No  Exercise/Diet: Exercise/Diet Do You Exercise?: No Have You Gained or Lost A Significant Amount of Weight in the Past Six Months?: No Do You Follow a Special Diet?: No Do You Have Any Trouble Sleeping?: No   CCA Employment/Education Employment/Work Situation: Employment / Work Systems developer: On disability Why is Patient on Disability: Mental Health Patient's Job has Been Impacted by Current Illness: No Has Patient ever Been in the [employer]?: No  Education: Education Is Patient Currently Attending School?: No Did Theme park manager?: No Did You Have An Individualized Education Program (IIEP): No Did You Have Any Difficulty At School?: No Patient's Education Has Been Impacted by Current Illness: No   CCA Family/Childhood History Family and Relationship History: Family history Marital status: Single Does patient have children?: No  Childhood History:  Childhood History By whom was/is the patient raised?: Mother Did patient suffer any verbal/emotional/physical/sexual abuse as a child?: No Did patient suffer from severe childhood neglect?: No Has patient ever been sexually abused/assaulted/raped as an adolescent or adult?: No Was the patient ever a victim of a crime or a disaster?: No Witnessed domestic violence?: No Has patient been affected by domestic violence as an adult?: No       CCA Substance Use Alcohol/Drug Use: Alcohol / Drug Use Pain Medications: See MAR Prescriptions: See MAR Over the Counter: See MAR History of alcohol / drug use?: Yes Longest period of sobriety (when/how long): Unable to quantify Substance #1 Name of Substance 1: Cocaine Substance  #2 Name of Substance 2: Cannabis                     ASAM's:  Six Dimensions of Multidimensional Assessment  Dimension 1:  Acute Intoxication and/or Withdrawal Potential:      Dimension 2:  Biomedical Conditions and Complications:      Dimension 3:  Emotional, Behavioral, or Cognitive Conditions and Complications:     Dimension 4:  Readiness to Change:     Dimension 5:  Relapse, Continued use, or Continued Problem Potential:     Dimension 6:  Recovery/Living Environment:     ASAM Severity Score:    ASAM Recommended Level of Treatment:     Substance use Disorder (SUD)    Recommendations for Services/Supports/Treatments:    Disposition Recommendation per psychiatric provider: Inpatient Treatment   DSM5 Diagnoses: Patient Active Problem List   Diagnosis Date Noted   Suicidal ideations 04/16/2023   Suicidal ideation 01/11/2023   Acute psychosis (HCC) 11/14/2022   Cocaine use disorder, severe, dependence (HCC) 09/04/2022   Vitamin D deficiency, unspecified 06/08/2022   Mixed hyperlipidemia 06/08/2022   Cocaine abuse (HCC) 11/22/2021   Cannabis use disorder    Aggressive behavior    Prediabetes 05/16/2020   Auditory hallucinations    Paranoid schizophrenia (HCC) 03/25/2019   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

## 2023-04-16 NOTE — ED Notes (Signed)
 Snack and water was provided for patient.

## 2023-04-16 NOTE — ED Notes (Signed)
 IVC/Consult ordered/Completed/Rec: Inpt Admit/Moved to [facility] Legal Paper work scanned and E-filed into chart

## 2023-04-17 NOTE — ED Provider Notes (Signed)
 Emergency Medicine Observation Re-evaluation Note  Kenneth Jensen is a 33 y.o. male, seen on rounds today.  Pt initially presented to the ED for complaints of Back Pain  Currently, the patient is is no acute distress. Denies any concerns at this time.  Physical Exam  Blood pressure (!) 122/106, pulse 96, temperature 98 F (36.7 C), temperature source Oral, resp. rate 16, height 5\' 1"  (1.549 m), weight 90.7 kg, SpO2 98%.  Physical Exam: General: No apparent distress Pulm: Normal WOB Neuro: Moving all extremities Psych: Resting comfortably     ED Course / MDM     I have reviewed the labs performed to date as well as medications administered while in observation.  Recent changes in the last 24 hours include: No acute events overnight.  Plan   Current plan: Patient evaluated by psychiatry, recommended inpatient hospitalization.  Patient was accepted to [facility]. Patient is under full IVC at this time.    Corena Herter, MD 04/17/23 [phone]

## 2023-04-17 NOTE — ED Notes (Signed)
 Attempted to call guardian- no answer, HIPAA compliant voice mail left at this time.

## 2023-04-17 NOTE — ED Notes (Signed)
 [facility] COUNTY  SHERIFF  DEPT  CALLED  FOR  TRANSFER  TO  [facility]

## 2023-04-17 NOTE — ED Notes (Signed)
 IVC, Accepted to [facility] after 8AM

## 2023-05-30 ENCOUNTER — Other Ambulatory Visit: Payer: Self-pay

## 2023-05-30 ENCOUNTER — Encounter: Payer: Self-pay | Admitting: Emergency Medicine

## 2023-05-30 DIAGNOSIS — F22 Delusional disorders: Secondary | ICD-10-CM | POA: Insufficient documentation

## 2023-05-30 DIAGNOSIS — F32A Depression, unspecified: Secondary | ICD-10-CM | POA: Diagnosis present

## 2023-05-30 NOTE — ED Notes (Addendum)
 Patient dressed in burgundy scrubs with staff and security present.   Bag 1: white and blue shoes, white jeans, black long sleeve shirt, brown jacket, gray boxers, white socks, black bracelet.  Bag 2: black book bag, white folder, cell phone, wallet, cell phone charger

## 2023-05-30 NOTE — ED Triage Notes (Signed)
 Patient states he has been out of depression medication and he has been feeling depressed since Mothers day - triggered by not being able to see his mother. Denies SI/HI in triage. Denies drug use tonight - reports 1 beer tonight.

## 2023-05-31 ENCOUNTER — Emergency Department
Admission: EM | Admit: 2023-05-31 | Discharge: 2023-05-31 | Disposition: A | Discharge status: Home / Self Care | Payer: Medicare (Managed Care) | Attending: Emergency Medicine | Admitting: Emergency Medicine

## 2023-05-31 DIAGNOSIS — F22 Delusional disorders: Secondary | ICD-10-CM | POA: Diagnosis not present

## 2023-05-31 LAB — ETHANOL: Alcohol, Ethyl (B): <15 mg/dL (ref <15)

## 2023-05-31 LAB — SALICYLATE LEVEL: Salicylate Lvl: <7 mg/dL — ABNORMAL LOW (ref 7.0–30.0)

## 2023-05-31 LAB — COMPREHENSIVE METABOLIC PANEL WITH GFR
ALT: 15 U/L (ref 0–44)
AST: 20 U/L (ref 15–41)
Albumin: 4.1 g/dL (ref 3.5–5.0)
Alkaline Phosphatase: 54 U/L (ref 38–126)
Anion gap: 11 (ref 5–15)
BUN: 9 mg/dL (ref 6–20)
CO2: 24 mmol/L (ref 22–32)
Calcium: 8.8 mg/dL — ABNORMAL LOW (ref 8.9–10.3)
Chloride: 103 mmol/L (ref 98–111)
Creatinine, Ser: 0.85 mg/dL (ref 0.61–1.24)
GFR, Estimated: >60 mL/min (ref >60)
Glucose, Bld: 106 mg/dL — ABNORMAL HIGH (ref 70–99)
Potassium: 3.8 mmol/L (ref 3.5–5.1)
Sodium: 138 mmol/L (ref 135–145)
Total Bilirubin: 0.6 mg/dL (ref 0.0–1.2)
Total Protein: 7.3 g/dL (ref 6.5–8.1)

## 2023-05-31 LAB — CBC
HCT: 42.1 % (ref 39.0–52.0)
Hemoglobin: 14.1 g/dL (ref 13.0–17.0)
MCH: 29 pg (ref 26.0–34.0)
MCHC: 33.5 g/dL (ref 30.0–36.0)
MCV: 86.4 fL (ref 80.0–100.0)
Platelets: 363 10*3/uL (ref 150–400)
RBC: 4.87 MIL/uL (ref 4.22–5.81)
RDW: 13.2 % (ref 11.5–15.5)
WBC: 9.4 10*3/uL (ref 4.0–10.5)
nRBC: 0 % (ref 0.0–0.2)

## 2023-05-31 LAB — ACETAMINOPHEN LEVEL: Acetaminophen (Tylenol), Serum: <10 ug/mL — ABNORMAL LOW (ref 10–30)

## 2023-05-31 MED ORDER — PALIPERIDONE ER 3 MG PO TB24
3.0000 mg | ORAL_TABLET | ORAL | Status: AC
  Administered 2023-05-31: 3 mg via ORAL
  Filled 2023-05-31: qty 1

## 2023-05-31 MED ORDER — QUETIAPINE FUMARATE 200 MG PO TABS
200.0000 mg | ORAL_TABLET | ORAL | Status: AC
  Administered 2023-05-31: 200 mg via ORAL
  Filled 2023-05-31: qty 1

## 2023-05-31 MED ORDER — DIVALPROEX SODIUM ER 250 MG PO TB24
500.0000 mg | ORAL_TABLET | ORAL | Status: AC
  Administered 2023-05-31: 500 mg via ORAL
  Filled 2023-05-31: qty 2

## 2023-05-31 MED ORDER — PRAZOSIN HCL 1 MG PO CAPS
1.0000 mg | ORAL_CAPSULE | ORAL | Status: AC
  Administered 2023-05-31: 1 mg via ORAL
  Filled 2023-05-31: qty 1

## 2023-05-31 MED ORDER — HALOPERIDOL 5 MG PO TABS
10.0000 mg | ORAL_TABLET | ORAL | Status: AC
  Administered 2023-05-31: 10 mg via ORAL
  Filled 2023-05-31: qty 2

## 2023-05-31 MED ORDER — BENZTROPINE MESYLATE 1 MG PO TABS
0.5000 mg | ORAL_TABLET | ORAL | Status: AC
  Administered 2023-05-31: 0.5 mg via ORAL
  Filled 2023-05-31: qty 1

## 2023-05-31 NOTE — ED Notes (Signed)
 Per Bolivar Bushman at [facility], Legal Guardian is Wendel Hals with [employer] in [place] [phone] and [phone].  Martine Sleek who is listed as a Armed forces operational officer Guardian is pt cousin, not a guardian.

## 2023-05-31 NOTE — ED Notes (Signed)
 Attempted to contact pt guardian Wendel Hals for D/C.  VM left.

## 2023-05-31 NOTE — ED Notes (Signed)
 Kenneth Jensen, [facility] team member stated she will pick pt up and take to [facility] crisis center at 0800.  Pt states that he would like her to. EDP Lajuana Pilar aware and preparing to D/C.

## 2023-05-31 NOTE — ED Notes (Signed)
 Belonging's returned to patient. This RN left VM for caregiver to alert to discharge. [facility] rep here to transport patient to their facility.

## 2023-05-31 NOTE — ED Notes (Signed)
 Pt states that he would like his depression medication and he would like to "change companies."  Stated that he no longer wants to use [facility] services and would like to try somewhere else. Calm and cooperative, eating Fritos and gingerale given per his request.

## 2023-05-31 NOTE — ED Notes (Signed)
 Urine sample requested from pt.  Pt stated unable to go and will try later.

## 2023-05-31 NOTE — Discharge Instructions (Signed)
 Please go directly to the crisis center with Kenneth Jensen.  They will be able to provide additional care and treatment

## 2023-05-31 NOTE — ED Notes (Signed)
 HIPAA compliant voice mail left with [facility] [phone].

## 2023-05-31 NOTE — ED Notes (Signed)
 Pt given phone to speak to [facility] with ACT team per his request.

## 2023-05-31 NOTE — ED Provider Notes (Signed)
 [facility] Provider Note    Event Date/Time   First MD Initiated Contact with Patient 05/31/23 0009     (approximate)   History   Depression   HPI Kenneth Jensen is a 33 y.o. male well-known to the emergency department for frequent visits involving substance abuse and psychiatric issues.  He presents voluntarily tonight saying that he is depressed.  He has been depressed since Mother's Day because he could not speak with his mother.  He adamantly denied having any suicidal thoughts or homicidal thoughts.  He told triage this and he confirmed when asked, but when I ask him why he was here tonight he said it was because he needs his morning medications.  He was unable to tell me what they are except that he thought it was a heart medication and the pill is orange and green.  He has no concerns at this time other than getting his medication and he wants to be left alone and pulls a blanket up over his head when I try to talk to him.  He says that he lives alone after being discharged from [facility] a few months ago (according to the medical record it was about a month and a half ago that he was transferred from here to [facility]).     Physical Exam   Triage Vital Signs: ED Triage Vitals  Encounter Vitals Group     BP 05/30/23 2340 (!) 138/92     Systolic BP Percentile --      Diastolic BP Percentile --      Pulse Rate 05/30/23 2340 98     Resp 05/30/23 2340 20     Temp 05/30/23 2340 98.6 F (37 C)     Temp src --      SpO2 05/30/23 2340 100 %     Weight 05/30/23 2341 90.7 kg (199 lb 15.3 oz)     Height 05/30/23 2341 1.549 m (5\' 1" )     Head Circumference --      Peak Flow --      Pain Score 05/30/23 2341 0     Pain Loc --      Pain Education --      Exclude from Growth Chart --     Most recent vital signs: Vitals:   05/30/23 2340  BP: (!) 138/92  Pulse: 98  Resp: 20  Temp: 98.6 F (37 C)  SpO2: 100%    General: Awake, no distress.   Pulls blanket over his head but will answer questions. CV:  Good peripheral perfusion.  Regular rate and rhythm. Resp:  Normal effort. Speaking easily and comfortably, no accessory muscle usage nor intercostal retractions.   Abd:  No distention.  Other:  Denies SI and HI, conversant, makes eye contact, does not seem to be responding to internal stimuli   ED Results / Procedures / Treatments   Labs (all labs ordered are listed, but only abnormal results are displayed) Labs Reviewed  COMPREHENSIVE METABOLIC PANEL WITH GFR - Abnormal; Notable for the following components:      Result Value   Glucose, Bld 106 (*)    Calcium 8.8 (*)    All other components within normal limits  ACETAMINOPHEN  LEVEL - Abnormal; Notable for the following components:   Acetaminophen  (Tylenol ), Serum <10 (*)    All other components within normal limits  SALICYLATE LEVEL - Abnormal; Notable for the following components:   Salicylate Lvl <7.0 (*)  All other components within normal limits  ETHANOL  CBC     PROCEDURES:  Critical Care performed: No  Procedures    IMPRESSION / MDM / ASSESSMENT AND PLAN / ED COURSE  I reviewed the triage vital signs and the nursing notes.                              Differential diagnosis includes, but is not limited to, acute on chronic psychiatric concerns such as polysubstance abuse and/or schizophrenia, medication refill, metabolic or electrolyte abnormality, malingering.  Patient's presentation is most consistent with acute presentation with potential threat to life or bodily function.  Labs/studies ordered: As per protocol, I ordered the following labs as part of the patient's medical and psychiatric evaluation:  CBC, CMP, ethanol level, acetaminophen  level, salicylate level, urine drug screen.  Interventions/Medications given:  Medications  paliperidone  (INVEGA ) 24 hr tablet 3 mg (has no administration in time range)  prazosin  (MINIPRESS ) capsule 1 mg  (has no administration in time range)  QUEtiapine  (SEROQUEL ) tablet 200 mg (has no administration in time range)  benztropine (COGENTIN) tablet 0.5 mg (has no administration in time range)  divalproex  (DEPAKOTE  ER) 24 hr tablet 500 mg (has no administration in time range)  haloperidol  (HALDOL ) tablet 10 mg (has no administration in time range)    (Note:  hospital course my include additional interventions and/or labs/studies not listed above.)   Vital signs normal, labs all within normal limits, negative ethanol level.  Patient has not provided urine specimen.  Patient is denying suicidal ideation and homicidal ideation and is not requesting to speak with a psychiatrist.  He is wanting his "morning medications", but is unclear about which medications he is talking.  His home medication list in [facility] indicates an expired prescriptions for Depakote , his Invega  monthly injections, prazosin , trazodone , Cogentin.  Given that prazosin  can be used to treat high blood pressure, I assume that this is the 1 to which he is referring, although it is listed in the system is taking it at bedtime rather than the morning.  I will try to reassess the patient shortly and determine what it is he is wanting or needing tonight.  At this point he does not meet criteria for IVC nor inpatient treatment either medically or psychiatrically.     Clinical Course as of 05/31/23 1610  Sun May 31, 2023  0406 Spoke again with the patient.  He says that he needs his medications and asked "hasn't [facility] gotten a hold of you?"  I reminded him that he has not been there for about 6 weeks.  We will attempt to reach out to [facility] to see if they will send us  a list of his medications at the time he was discharged from their facility [CF]  0519 A HIPAA compliant voicemail was left at [facility], no call back yet.  Patient has been sleeping with no concerns or distress.  Of note, when I discussed it with him earlier, he did not  indicate that he needed to speak with psychiatry, he just wanted his medications [CF]  [phone] I had a very informative conversation with Bolivar Bushman with [facility] ACT Team at [phone].  She knows him well and she and her team talk to him multiple times a week.  She said that they have been reaching out to him repeatedly and she knows him well and his medications and she was able to give  us  a list as documented by his nurse, Juli Oas.  She was comfortable with the plan for him to go home and she would follow-up with him at the next available opportunity.  I talked with him about this and he got agitated and talked about how he did not want to go home.  I explained I could not just keep him in the emergency department.  I decided to have him speak directly with Adel Holt.  They are currently speaking by phone right now. [CF]  [phone] Adel Holt with the act team spoke with him and offered to come pick him up in about an hour and transport him to the crisis center.  He agreed with this plan.  I offered to order his morning medications because he is very concerned about them, so I ordered them based on the medication list provided by the ACT team.  I have discharged him and he will wait here until Lavonne gets here to transport him.  His guardian has been made aware. [CF]    Clinical Course User Index [CF] Lynnda Sas, MD     FINAL CLINICAL IMPRESSION(S) / ED DIAGNOSES   Final diagnoses:  Paranoia (HCC)     Rx / DC Orders   ED Discharge Orders     None        Note:  This document was prepared using Dragon voice recognition software and may include unintentional dictation errors.   Lynnda Sas, MD 05/31/23 [id number]

## 2023-05-31 NOTE — ED Notes (Signed)
 VOL/Med refill

## 2023-05-31 NOTE — ED Notes (Signed)
 Per Bolivar Bushman with [facility] ACT Team at [phone], pt lives alone in apartment, walks as main transportation and is seen multiple times weekly by ACT team.    States medications as follows:  Trazadone HCL 50 mg nightly Hydroxyzine  25 mg daily PRN Haloperidol  10 mg daily Divalproex  Sodium ER 500 mg daily Benztropene 0.5 mg daily Quetiapine  200 mg daily  Prazosin  1 mg daily Paliperidone  ER 3mg  daily Invega  Inj 34mg  every 28 days (last given 5/12)  EDP Lajuana Pilar notified.

## 2023-07-10 ENCOUNTER — Other Ambulatory Visit: Payer: Self-pay

## 2023-07-10 ENCOUNTER — Emergency Department
Admission: EM | Admit: 2023-07-10 | Discharge: 2023-07-10 | Disposition: A | Discharge status: Home / Self Care | Payer: Medicare (Managed Care) | Attending: Emergency Medicine | Admitting: Emergency Medicine

## 2023-07-10 DIAGNOSIS — F1590 Other stimulant use, unspecified, uncomplicated: Secondary | ICD-10-CM | POA: Diagnosis not present

## 2023-07-10 DIAGNOSIS — J45909 Unspecified asthma, uncomplicated: Secondary | ICD-10-CM | POA: Diagnosis not present

## 2023-07-10 DIAGNOSIS — F1290 Cannabis use, unspecified, uncomplicated: Secondary | ICD-10-CM

## 2023-07-10 DIAGNOSIS — F200 Paranoid schizophrenia: Secondary | ICD-10-CM | POA: Diagnosis not present

## 2023-07-10 DIAGNOSIS — R440 Auditory hallucinations: Secondary | ICD-10-CM | POA: Diagnosis present

## 2023-07-10 DIAGNOSIS — F209 Schizophrenia, unspecified: Secondary | ICD-10-CM

## 2023-07-10 LAB — COMPREHENSIVE METABOLIC PANEL WITH GFR
ALT: 12 U/L (ref 0–44)
AST: 26 U/L (ref 15–41)
Albumin: 3.8 g/dL (ref 3.5–5.0)
Alkaline Phosphatase: 48 U/L (ref 38–126)
Anion gap: 11 (ref 5–15)
BUN: 11 mg/dL (ref 6–20)
CO2: 22 mmol/L (ref 22–32)
Calcium: 8.7 mg/dL — ABNORMAL LOW (ref 8.9–10.3)
Chloride: 102 mmol/L (ref 98–111)
Creatinine, Ser: 0.93 mg/dL (ref 0.61–1.24)
GFR, Estimated: >60 mL/min (ref >60)
Glucose, Bld: 121 mg/dL — ABNORMAL HIGH (ref 70–99)
Potassium: 3.9 mmol/L (ref 3.5–5.1)
Sodium: 135 mmol/L (ref 135–145)
Total Bilirubin: 0.5 mg/dL (ref 0.0–1.2)
Total Protein: 6.8 g/dL (ref 6.5–8.1)

## 2023-07-10 LAB — CBC
HCT: 39.7 % (ref 39.0–52.0)
Hemoglobin: 13.3 g/dL (ref 13.0–17.0)
MCH: 29.7 pg (ref 26.0–34.0)
MCHC: 33.5 g/dL (ref 30.0–36.0)
MCV: 88.6 fL (ref 80.0–100.0)
Platelets: 312 K/uL (ref 150–400)
RBC: 4.48 MIL/uL (ref 4.22–5.81)
RDW: 13.6 % (ref 11.5–15.5)
WBC: 10.9 K/uL — ABNORMAL HIGH (ref 4.0–10.5)
nRBC: 0 % (ref 0.0–0.2)

## 2023-07-10 LAB — ETHANOL: Alcohol, Ethyl (B): <15 mg/dL (ref <15)

## 2023-07-10 NOTE — ED Notes (Signed)
 This RN attempted to collect urine but Pt states he needs something to drink and eat as he hasn't eaten in a day. This RN provided food and water, however reiterated rules if Pt were to be admitted into Psych post medical clearance. Pt acknowledges rules and understands them, no questions at this time.

## 2023-07-10 NOTE — ED Notes (Signed)
 Snacks and water given to pt

## 2023-07-10 NOTE — ED Notes (Signed)
 Pt refusing urine sample.

## 2023-07-10 NOTE — ED Provider Notes (Signed)
 Per psychiatry TTS counselor will reach out to the legal guardian. Inpatient psychiatric admission will be a highly restrictive setting at this time. Patient is psychiatrically at baseline and cleared for discharge with recommendation to follow-up with his outpatient ACT team through [facility].  Patient ready for discarhge, guardian has been contacted by TTS as well   Kenneth Anes, MD 07/10/23 1528

## 2023-07-10 NOTE — ED Notes (Signed)
 Hospital meal provided, pt tolerated w/o complaints.  Waste discarded appropriately.

## 2023-07-10 NOTE — ED Notes (Addendum)
 This RN attempted to collect urine but Pt unable to provide sample.

## 2023-07-10 NOTE — BH Assessment (Signed)
 TTS spoke with the patient's agency's guardian, [employer] [phone]). Informed her the patient is getting discharged. She agreed with the plan. She also confirmed he lives independently and receives mental health services with [facility] ACT Team. MARYLAND Margo is the team lead. TTS updated the patient's nurse Stevie) and Psych MD (Dr. Donnelly).

## 2023-07-10 NOTE — Consult Note (Signed)
 [facility] Psychiatric Consult Initial  Patient Name: .Kenneth Jensen  MRN: [id number]  DOB: [birth year]  Consult Order details:  Orders (From admission, onward)     Start     Ordered   07/10/23 0652  CONSULT TO CALL ACT TEAM       Ordering Provider: Gordan Huxley, MD  Provider:  (Not yet assigned)  Question:  Reason for Consult?  Answer:  Psych consult   07/10/23 0652   07/10/23 0652  IP CONSULT TO PSYCHIATRY       Ordering Provider: Gordan Huxley, MD  Provider:  (Not yet assigned)  Question Answer Comment  Consult Timeframe URGENT - requires response within 12 hours   URGENT timeframe requires provider to provider communication, has the provider to provider communication been completed Yes   Reason for Consult? aggressive behavior, hallucinations, states I don't feel safe   Contact phone number where the requesting provider can be reached [phone]      07/10/23 0652             Mode of Visit: In person    Psychiatry Consult Evaluation  Service Date: July 10, 2023 LOS:  LOS: 0 days  Chief Complaint move me to another place  Primary Psychiatric Diagnoses  Schizophrenia,paranoid 2.  Stimulant use disorder 3.  Cannabis use disorder  Assessment  Kenneth Jensen is a 33 y.o. male admitted: with hx of Schizophrenia came to do Ed for hallucinations on July 4th .  Psychiatry is consulted as patient requested to talk to psychiatry.  On assessment patient reports getting good rest last night in the ED and denies current hallucinations, denies SI/HI/intent/plan.  He is able to acknowledge that he has good follow-up in the community through [facility] ACT team.  There are no other safety concerns at this time.  TTS counselor will reach out to the legal guardian.  Inpatient psychiatric admission will be a highly restrictive setting at this time.  Patient is psychiatrically at baseline and cleared for discharge with recommendation to follow-up with his outpatient ACT team through  [facility].     Diagnoses:  Active Hospital problems: Active Problems:   * No active hospital problems. *    Plan   ## Psychiatric Medication Recommendations:  Continue home medications  ## Medical Decision Making Capacity: Not specifically addressed in this encounter  ## Further Work-up:  No additional workup recommended   ## Disposition:-- There are no psychiatric contraindications to discharge at this time  ## Behavioral / Environmental: - No specific recommendations at this time.     ## Safety and Observation Level:  - Based on my clinical evaluation, I estimate the patient to be at low risk of self harm in the current setting. - At this time, we recommend  routine. This decision is based on my review of the chart including patient's history and current presentation, interview of the patient, mental status examination, and consideration of suicide risk including evaluating suicidal ideation, plan, intent, suicidal or self-harm behaviors, risk factors, and protective factors. This judgment is based on our ability to directly address suicide risk, implement suicide prevention strategies, and develop a safety plan while the patient is in the clinical setting. Please contact our team if there is a concern that risk level has changed.  CSSR Risk Category:C-SSRS RISK CATEGORY: No Risk  Suicide Risk Assessment: Patient has following modifiable risk factors for suicide: recent psychiatric hospitalization, which we are addressing by recommend to continue to follow-up with ACT team in the  community through [employer]. Patient has following non-modifiable or demographic risk factors for suicide: male gender Patient has the following protective factors against suicide: Access to outpatient mental health care, Supportive family, Cultural, spiritual, or religious beliefs that discourage suicide, and no history of suicide attempts  Thank you for this consult request. Recommendations have been  communicated to the primary team.  We will sign off at this time.   Allyn Foil, MD       History of Present Illness  Kenneth Jensen is a 33 y.o. male admitted: with hx of Schizophrenia came to do Ed for hallucinations on July 4th .  Psychiatry is consulted as patient requested to talk to psychiatry. Patient Report:  On interview patient is noted to be sleeping in his bed.  He needed multiple promptings to stay awake and answer the questions.  He reports coming from home.  He reports getting rest in the ED last night.  He denies current auditory/visual hallucinations.  He denies feeling depressed or anxious.  He denies suicidal/homicidal ideation/intent/plan.  He reports that he follows up with [facility] ACT team in the community.  He is unable to recall his medications but reports taking them on regular basis.  He denies having any access to guns or firearms.  He reports that it has been a long time that he was hospitalized in the psychiatric facility.  He denies any suicide attempts in the recent times.  He denies alcohol use but does endorse marijuana use on daily basis.  He is agreeable to be discharged and work with the ACT team in the community.  Psych ROS:  Depression: Sometimes , denies SI/HI/intent/plan Anxiety: Denies Mania (lifetime and current): Not displaying any grandiose delusions Psychosis: (lifetime and current): Not responding to internal stimuli  Collateral information:  [name] [phone] is a legal guardian-TTS counselor is contacting the legal guardian.  This provider called the number left a HIPAA compliant voicemail  Psychiatric and Social History  Psychiatric History:  Information collected from patient's chart  Prev Dx/Sx: Schizophrenia Current Psych Provider: [facility] Home Meds (current): Depakote , Haldol , Invega , Seroquel , trazodone , Minipress  Previous Med Trials: Invega  Sustenna Therapy: [facility]  Prior Psych Hospitalization: Many years ago Prior Self Harm:  Denies Prior Violence: Denies  Family Psych History: Unknown Family Hx suicide: Unknown  Social History:  Educational Hx: Unknown Occupational Hx: On disability Legal Hx: None reported Living Situation: Has a legal guardian Spiritual Hx: None reported Access to weapons/lethal means: Denies  Substance History Alcohol: Denies  Tobacco: Denies Illicit drugs: Cannabis intermittent use, has history of cocaine use Prescription drug abuse: Denies Rehab hx: Reported  Exam Findings  Physical Exam: Reviewed and agree with the physical exam findings conducted by the ED provider Vital Signs:  Temp:  [97.9 F (36.6 C)-98.5 F (36.9 C)] 97.9 F (36.6 C) (07/04 0935) Pulse Rate:  [63-80] 63 (07/04 0935) Resp:  [18] 18 (07/04 0935) BP: (105-143)/(70-74) 105/70 (07/04 0935) SpO2:  [99 %-100 %] 100 % (07/04 0935) Weight:  [90 kg] 90 kg (07/04 0230) Blood pressure 105/70, pulse 63, temperature 97.9 F (36.6 C), temperature source Oral, resp. rate 18, height 5' 1 (1.549 m), weight 90 kg, SpO2 100%. Body mass index is 37.49 kg/m.    Mental Status Exam: General Appearance: Fairly Groomed  Orientation:  Full (Time, Place, and Person)  Memory:  Immediate;   Fair Recent;   Fair Remote;   Fair  Concentration:  Concentration: Poor and Attention Span: Poor  Recall:  Fair  Attention  Poor  Eye Contact:  Minimal  Speech:  Clear and Coherent  Language:  Fair  Volume:  Normal  Mood: fine  Affect:  Congruent  Thought Process:  Coherent  Thought Content:  Logical  Suicidal Thoughts:  No  Homicidal Thoughts:  No  Judgement:  Intact  Insight:  Shallow  Psychomotor Activity:  Normal  Akathisia:  No  Fund of Knowledge:  Fair      Assets:  Communication Skills Desire for Improvement  Cognition:  WNL  ADL's:  Intact  AIMS (if indicated):        Other History   These have been pulled in through the EMR, reviewed, and updated if appropriate.  Family History:  The patient's family  history is not on file.  Medical History: Past Medical History:  Diagnosis Date   Asthma    Bipolar 1 disorder (HCC)    Depression    Schizophrenia (HCC)    Schizotaxia     Surgical History: History reviewed. No pertinent surgical history.   Medications:  No current facility-administered medications for this encounter.  Current Outpatient Medications:    benztropine  (COGENTIN ) 0.5 MG tablet, Take 0.5 mg by mouth daily., Disp: , Rfl:    divalproex  (DEPAKOTE  ER) 500 MG 24 hr tablet, Take 2 tablets (1,000 mg total) by mouth at bedtime for 14 days., Disp: 28 tablet, Rfl: 0   haloperidol  (HALDOL ) 10 MG tablet, Take 10 mg by mouth 3 (three) times daily., Disp: , Rfl:    nicotine  polacrilex (NICORETTE ) 2 MG gum, Take 1 each (2 mg total) by mouth as needed for smoking cessation., Disp: 100 tablet, Rfl: 0   paliperidone  (INVEGA  SUSTENNA) 234 MG/1.5ML injection, Inject 234 mg into the muscle every 28 (twenty-eight) days., Disp: 1.8 mL, Rfl: 1   paliperidone  (INVEGA ) 3 MG 24 hr tablet, Take 3 mg by mouth at bedtime., Disp: , Rfl:    prazosin  (MINIPRESS ) 1 MG capsule, Take 1 capsule (1 mg total) by mouth at bedtime., Disp: 14 capsule, Rfl: 0   QUEtiapine  (SEROQUEL ) 200 MG tablet, Take 200 mg by mouth at bedtime., Disp: , Rfl:    traZODone  (DESYREL ) 50 MG tablet, Take 1 tablet (50 mg total) by mouth at bedtime as needed for up to 14 days for sleep., Disp: 14 tablet, Rfl: 0  Allergies: Allergies  Allergen Reactions   Penicillins Anaphylaxis   Amoxicillin Swelling   Percocet [Oxycodone-Acetaminophen ]    Vicodin [Hydrocodone-Acetaminophen ] Itching   Vicodin [Hydrocodone-Acetaminophen ]     Vyctoria Dickman, MD

## 2023-07-10 NOTE — ED Provider Notes (Signed)
 [facility] Provider Note    Event Date/Time   First MD Initiated Contact with Patient 07/10/23 0246     (approximate)   History   Psychiatric Evaluation   HPI Kenneth Jensen is a 33 y.o. male well-known to the emergency department for frequent visits associated with his chronic psychiatric illness.  He has outpatient resources including [facility] and an ACT team.  His most frequent contact isYvonne Leonce with [facility] ACT Team at [phone].  He came in tonight because he is upset at his family and states that he does not feel safe being out there.  He is not expressing any suicidal ideation or homicidal ideation.  He states he is experiencing auditory hallucinations but that is normal for him but they are worse now because it is July 4.  He keeps saying that he is not going to [facility] and that he just needs some time to be safe.  No medical complaints.     Physical Exam   Triage Vital Signs: ED Triage Vitals [07/10/23 0230]  Encounter Vitals Group     BP (!) 143/74     Girls Systolic BP Percentile      Girls Diastolic BP Percentile      Boys Systolic BP Percentile      Boys Diastolic BP Percentile      Pulse Rate 80     Resp 18     Temp 98.5 F (36.9 C)     Temp Source Oral     SpO2 99 %     Weight 90 kg (198 lb 6.6 oz)     Height 1.549 m (5' 1)     Head Circumference      Peak Flow      Pain Score 0     Pain Loc      Pain Education      Exclude from Growth Chart     Most recent vital signs: Vitals:   07/10/23 0230  BP: (!) 143/74  Pulse: 80  Resp: 18  Temp: 98.5 F (36.9 C)  SpO2: 99%    General: Awake, no distress.  CV:  Good peripheral perfusion.  Resp:  Normal effort. Speaking easily and comfortably, no accessory muscle usage nor intercostal retractions.   Abd:  No distention.  Other:  Patient speaks in an aggressive manner but is not issuing any threats.  Will not make eye contact.  Reports auditory hallucinations.  States  he does not feel safe and refuses to go to [facility].   ED Results / Procedures / Treatments   Labs (all labs ordered are listed, but only abnormal results are displayed) Labs Reviewed  COMPREHENSIVE METABOLIC PANEL WITH GFR - Abnormal; Notable for the following components:      Result Value   Glucose, Bld 121 (*)    Calcium 8.7 (*)    All other components within normal limits  CBC - Abnormal; Notable for the following components:   WBC 10.9 (*)    All other components within normal limits  ETHANOL  URINE DRUG SCREEN, QUALITATIVE ([facility] ONLY)      PROCEDURES:  Critical Care performed: No  Procedures    IMPRESSION / MDM / ASSESSMENT AND PLAN / ED COURSE  I reviewed the triage vital signs and the nursing notes.                              Differential diagnosis includes,  but is not limited to, Malingering, substance-induced mood disorder, decompensating schizophrenia.  Patient's presentation is most consistent with acute presentation with potential threat to life or bodily function.  Labs/studies ordered: As per protocol, I ordered the following labs as part of the patient's medical and psychiatric evaluation:  CBC, CMP, ethanol level, urine drug screen.  Interventions/Medications given:  Medications - No data to display  (Note:  hospital course my include additional interventions and/or labs/studies not listed above.)   Very difficult to assess whether the patient is decompensating from a psychiatric perspective or whether he is malingering for a place to stay after having a disagreement with his family.  In the past I have seen him and he has agreed to go with his ACT team but he is flatly refusing this at this time and when I continue to discuss it with him he is getting increasingly aggressive.  In an attempt to de-escalate the situation I will keep him in the emergency department and consult psychiatry and TTS.  However, I do not think he represents an immediate danger to  himself or others and should he decide to leave he is free to do so.  Psych labs are all essentially normal.  The patient has been placed in psychiatric observation due to the need to provide a safe environment for the patient while obtaining psychiatric consultation and evaluation, as well as ongoing medical and medication management to treat the patient's condition.  The patient has not been placed under full IVC at this time.       FINAL CLINICAL IMPRESSION(S) / ED DIAGNOSES   Final diagnoses:  Paranoid schizophrenia (HCC)     Rx / DC Orders   ED Discharge Orders     None        Note:  This document was prepared using Dragon voice recognition software and may include unintentional dictation errors.   Gordan Huxley, MD 07/10/23 [phone]

## 2023-07-10 NOTE — ED Notes (Signed)
 VOL, pend placement possible D/C'd

## 2023-07-10 NOTE — ED Notes (Addendum)
 This RN attempted UDS sample, Pt refusing stating You don't even look like a human, you look like a zombie, I don't want to give you a urine sample, I refuse. EDP notified. RN will continue attempts.

## 2023-07-10 NOTE — ED Notes (Signed)
 Pt transported to [facility] with belongings from cart, placed in locker. No incident, escorted with security.

## 2023-07-10 NOTE — ED Notes (Signed)
 Lunch tray and water given to pt
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
## 2023-07-10 NOTE — ED Triage Notes (Addendum)
 Patient here, walked by himself, stating It's the 4th of July, and I'm hearing voices, and I don't feel comfortable hearing voices on the 4th of July. States the voices are not telling him to harm himself currently. Denies SI/ HI. Denies EtoH or drug use tonight.
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
## 2023-07-10 NOTE — ED Notes (Signed)
 This RN attempts UDS sample, Pt continues to refuse urine sample.

## 2023-07-10 NOTE — Discharge Instructions (Signed)
You have been seen in the Emergency Department (ED) today for a psychiatric complaint.  You have been evaluated by psychiatry and we believe you are safe to be discharged from the hospital.   ° °Please return to the ED immediately if you have ANY thoughts of hurting yourself or anyone else, so that we may help you. ° °Please avoid alcohol and drug use. ° °Follow up with your doctor and/or therapist as soon as possible regarding today's ED visit.   Please follow up any other recommendations and clinic appointments provided by the psychiatry team that saw you in the Emergency Department. ° °

## 2023-07-11 ENCOUNTER — Other Ambulatory Visit: Payer: Self-pay

## 2023-07-11 ENCOUNTER — Emergency Department
Admission: EM | Admit: 2023-07-11 | Discharge: 2023-07-11 | Disposition: A | Discharge status: Home / Self Care | Payer: Medicare (Managed Care)

## 2023-07-11 DIAGNOSIS — F209 Schizophrenia, unspecified: Secondary | ICD-10-CM | POA: Diagnosis not present

## 2023-07-11 DIAGNOSIS — J45909 Unspecified asthma, uncomplicated: Secondary | ICD-10-CM | POA: Diagnosis not present

## 2023-07-11 DIAGNOSIS — R440 Auditory hallucinations: Secondary | ICD-10-CM | POA: Diagnosis present

## 2023-07-11 NOTE — ED Notes (Signed)
 RN attempted to call legal guardian with listed phone number. Person answering phone denied being Kenneth Jensen (pt's legal guardian).

## 2023-07-11 NOTE — ED Notes (Signed)
 Pt woken up by this RN to get VS. Pt waking up and allowing this RN to get vitals. Pt made aware that he is being d/c. Pt states can it wait till it gets light outside? I don't want to walk back in the dark. RN informed pt that it is morning at 8am. Pt continues to close eyes and go back to sleep. RN encouraged pt to sit up and wake up so he can be d/c.

## 2023-07-11 NOTE — ED Notes (Addendum)
 Pt dressed out:  Multi colored shoes Blue jeans Grey gloves Yellow shirt Black lanyard with wallet and key Blue boxer Publishing copy

## 2023-07-11 NOTE — ED Notes (Signed)
 Rn returned belongings to pt to get dressed. Pt given d/c papers. Pt resisting getting out of bed. Pt stating that he does not want to leave. Pt states he wants to stay in the hospital and that he does not want to go back to his apartment. Pt reassured that he has been evaluated by MD and is ready for d/c. Pt cursing at staff. Charge RN aware. Charge RN to bedside to assist pt and educate pt about d/c.

## 2023-07-11 NOTE — ED Notes (Signed)
 Pt given belongings. Pt stating that he has items locked by security. Security called to get items from safe.

## 2023-07-11 NOTE — ED Notes (Signed)
Pt being escorted out by security

## 2023-07-11 NOTE — ED Notes (Signed)
 Security and [employer] Research officer, trade union at bedside to assist this Charity fundraiser. Pt refusing to be d/c. Pt stating he had a Advertising account planner when he came in. This RN reviewed note about belongings and no charger listed in belongings for this visit. Pt made aware. RN ensured pt received all belongings listed.

## 2023-07-11 NOTE — ED Notes (Addendum)
 Per Calton (legal guardian), Calton states that this pt can walk back and gave this RN permission to let pt walk out of ED if ready for d/c. Legal guardian given update on visit. Cassandra states their agency is this pt's legal guardian. Per legal guardian, pt good to d/c to himself.

## 2023-07-11 NOTE — ED Triage Notes (Addendum)
 Pt to ed via himself due to I am hearing voices. Pt was just seen here today earlier for same. Pt then states I aint staying. Pt denies SI in triage. No labs collected, pt just had labs done this morning.

## 2023-07-11 NOTE — ED Notes (Signed)
 RN attempted to call pt's Legal guardian with other listed number in pt chart. No answer.

## 2023-07-11 NOTE — Discharge Instructions (Signed)
 Your evaluation in the emergency department was overall reassuring.  Please follow-up with your primary care and behavioral health providers for ongoing evaluation and management.  Return to the emergency department any new or worsening symptoms.

## 2023-07-11 NOTE — ED Provider Notes (Signed)
 [facility] Provider Note    Event Date/Time   First MD Initiated Contact with Patient 07/11/23 [phone]     (approximate)   History   Psychiatric Evaluation  Pt to ed via himself due to I am hearing voices. Pt was just seen here today earlier for same. Pt then states I aint staying. Pt denies SI in triage. No labs collected, pt just had labs done this morning.    HPI Kenneth Jensen is a 33 y.o. male well-known to our emergency department for frequent visits PMH paranoid schizophrenia, multisubstance abuse, bipolar 1 disorder, asthma presents for evaluation of auditory hallucinations - Patient has no clear complaints at time of my eval.  Does endorse some ongoing auditory hallucinations.  Unclear exactly why he came to the emergency department, but when I asked further about this he says he wanted somewhere to sleep and that he was stressed about it being 4 July because he felt people do idiotic things on this day. - Denies SI, HI  Per chart review, patient was seen less than 24 hours ago in our emergency department for similar complaints.  Screening labs normal.  Was evaluated by psychiatry who cleared him for discharge home stating he is at his baseline.  Patient appears less agitated no than he was during his prior visit per chart review.     Physical Exam   Triage Vital Signs: ED Triage Vitals  Encounter Vitals Group     BP 07/11/23 0202 (!) 139/90     Girls Systolic BP Percentile --      Girls Diastolic BP Percentile --      Boys Systolic BP Percentile --      Boys Diastolic BP Percentile --      Pulse Rate 07/11/23 0202 93     Resp 07/11/23 0202 16     Temp 07/11/23 0202 98.6 F (37 C)     Temp Source 07/11/23 0202 Oral     SpO2 07/11/23 0202 95 %     Weight --      Height 07/11/23 0158 5' 1 (1.549 m)     Head Circumference --      Peak Flow --      Pain Score 07/11/23 0158 0     Pain Loc --      Pain Education --      Exclude  from Growth Chart --     Most recent vital signs: Vitals:   07/11/23 0202  BP: (!) 139/90  Pulse: 93  Resp: 16  Temp: 98.6 F (37 C)  SpO2: 95%     General: Awake, no distress.  CV:  Good peripheral perfusion. Resp:  Normal effort.  Psych:  Endorses auditory hallucinations, denies SI, HI.  Overall calm and nonaggressive.   ED Results / Procedures / Treatments   Labs (all labs ordered are listed, but only abnormal results are displayed) Labs Reviewed - No data to display   EKG  N/a   RADIOLOGY N/a    PROCEDURES:  Critical Care performed: No  Procedures   MEDICATIONS ORDERED IN ED: Medications - No data to display   IMPRESSION / MDM / ASSESSMENT AND PLAN / ED COURSE  I reviewed the triage vital signs and the nursing notes.                              DDX/MDM/AP: Differential diagnosis includes, but is not limited  to, schizophrenia without evidence of acute decompensation at this time, consider malingering, no concern for underlying organic etiology of presentation at this time.  Plan: - No clear indication for IVC or repeat psychiatric evaluation at this time - Will allow patient to rest in emergency department for a brief period but overall plan for discharge home.  No evidence of clinical/psychiatric decompensation on my eval.  Do suspect some component of secondary gain may have led to presentation today.  Patient's presentation is most consistent with exacerbation of chronic illness.   ED course below.  Patient discharged home after allowing him to sleep in the emergency department for a brief period.  Plan for PMD and behavioral follow-up.  ED return precautions in place.      FINAL CLINICAL IMPRESSION(S) / ED DIAGNOSES   Final diagnoses:  Schizophrenia, unspecified type (HCC)     Rx / DC Orders   ED Discharge Orders     None        Note:  This document was prepared using Dragon voice recognition software and may include  unintentional dictation errors.   [name] LABOR, MD 07/11/23 0600

## 2023-07-14 ENCOUNTER — Other Ambulatory Visit: Payer: Self-pay

## 2023-07-14 ENCOUNTER — Emergency Department
Admission: EM | Admit: 2023-07-14 | Discharge: 2023-07-14 | Disposition: A | Discharge status: Home / Self Care | Payer: Medicare (Managed Care) | Attending: Emergency Medicine | Admitting: Emergency Medicine

## 2023-07-14 DIAGNOSIS — F209 Schizophrenia, unspecified: Secondary | ICD-10-CM | POA: Diagnosis not present

## 2023-07-14 DIAGNOSIS — F1590 Other stimulant use, unspecified, uncomplicated: Secondary | ICD-10-CM

## 2023-07-14 DIAGNOSIS — F1290 Cannabis use, unspecified, uncomplicated: Secondary | ICD-10-CM | POA: Diagnosis not present

## 2023-07-14 DIAGNOSIS — Z8659 Personal history of other mental and behavioral disorders: Secondary | ICD-10-CM | POA: Diagnosis not present

## 2023-07-14 DIAGNOSIS — F1910 Other psychoactive substance abuse, uncomplicated: Secondary | ICD-10-CM | POA: Insufficient documentation

## 2023-07-14 DIAGNOSIS — F1210 Cannabis abuse, uncomplicated: Secondary | ICD-10-CM | POA: Diagnosis not present

## 2023-07-14 DIAGNOSIS — J45909 Unspecified asthma, uncomplicated: Secondary | ICD-10-CM | POA: Insufficient documentation

## 2023-07-14 DIAGNOSIS — Z765 Malingerer [conscious simulation]: Secondary | ICD-10-CM | POA: Diagnosis not present

## 2023-07-14 DIAGNOSIS — F1510 Other stimulant abuse, uncomplicated: Secondary | ICD-10-CM | POA: Diagnosis not present

## 2023-07-14 DIAGNOSIS — F602 Antisocial personality disorder: Secondary | ICD-10-CM | POA: Diagnosis not present

## 2023-07-14 DIAGNOSIS — F419 Anxiety disorder, unspecified: Secondary | ICD-10-CM | POA: Diagnosis present

## 2023-07-14 LAB — CBC
HCT: 39.7 % (ref 39.0–52.0)
Hemoglobin: 13.7 g/dL (ref 13.0–17.0)
MCH: 30 pg (ref 26.0–34.0)
MCHC: 34.5 g/dL (ref 30.0–36.0)
MCV: 87.1 fL (ref 80.0–100.0)
Platelets: 324 K/uL (ref 150–400)
RBC: 4.56 MIL/uL (ref 4.22–5.81)
RDW: 13.6 % (ref 11.5–15.5)
WBC: 11.2 K/uL — ABNORMAL HIGH (ref 4.0–10.5)
nRBC: 0 % (ref 0.0–0.2)

## 2023-07-14 LAB — COMPREHENSIVE METABOLIC PANEL WITH GFR
ALT: 12 U/L (ref 0–44)
AST: 17 U/L (ref 15–41)
Albumin: 4 g/dL (ref 3.5–5.0)
Alkaline Phosphatase: 53 U/L (ref 38–126)
Anion gap: 13 (ref 5–15)
BUN: 13 mg/dL (ref 6–20)
CO2: 23 mmol/L (ref 22–32)
Calcium: 9.1 mg/dL (ref 8.9–10.3)
Chloride: 101 mmol/L (ref 98–111)
Creatinine, Ser: 0.71 mg/dL (ref 0.61–1.24)
GFR, Estimated: >60 mL/min (ref >60)
Glucose, Bld: 95 mg/dL (ref 70–99)
Potassium: 3.6 mmol/L (ref 3.5–5.1)
Sodium: 137 mmol/L (ref 135–145)
Total Bilirubin: 0.5 mg/dL (ref 0.0–1.2)
Total Protein: 7.1 g/dL (ref 6.5–8.1)

## 2023-07-14 LAB — ETHANOL: Alcohol, Ethyl (B): <15 mg/dL (ref <15)

## 2023-07-14 LAB — URINE DRUG SCREEN, QUALITATIVE (ARMC ONLY)
Amphetamines, Ur Screen: NOT DETECTED
Barbiturates, Ur Screen: NOT DETECTED
Benzodiazepine, Ur Scrn: NOT DETECTED
Cannabinoid 50 Ng, Ur ~~LOC~~: POSITIVE — AB
Cocaine Metabolite,Ur ~~LOC~~: POSITIVE — AB
MDMA (Ecstasy)Ur Screen: NOT DETECTED
Methadone Scn, Ur: NOT DETECTED
Opiate, Ur Screen: NOT DETECTED
Phencyclidine (PCP) Ur S: NOT DETECTED
Tricyclic, Ur Screen: NOT DETECTED

## 2023-07-14 MED ORDER — IBUPROFEN 600 MG PO TABS: 600.0000 mg | ORAL_TABLET | Freq: Three times a day (TID) | ORAL | Status: DC | PRN

## 2023-07-14 MED ORDER — ALUM & MAG HYDROXIDE-SIMETH 200-200-20 MG/5ML PO SUSP: 30.0000 mL | Freq: Four times a day (QID) | ORAL | Status: DC | PRN

## 2023-07-14 MED ORDER — ONDANSETRON HCL 4 MG PO TABS: 4.0000 mg | ORAL_TABLET | Freq: Three times a day (TID) | ORAL | Status: DC | PRN

## 2023-07-14 NOTE — BH Assessment (Addendum)
 Comprehensive Clinical Assessment (CCA) Screening, Triage and Referral Note  07/14/2023 SAAMIR ARMSTRONG [id number]  Chief Complaint:  Chief Complaint  Patient presents with   Psychiatric Evaluation   Visit Diagnosis: Schizophrenia   Patient Reported Information How did you hear about us ? Other (Comment) [facility] ED)  What Is the Reason for Your Visit/Call Today? Per EDP's note:Pt is a 33 y.o. male with a history of schizophrenia, substance abuse who comes ED complaining of wanting to kill people in his apartment.  Also feels anxious.  How Long Has This Been Causing You Problems? <Week  What Do You Feel Would Help You the Most Today? Treatment for Depression or other mood problem; Stress Management; Medication(s)   Have You Recently Had Any Thoughts About Hurting Yourself? No  Are You Planning to Commit Suicide/Harm Yourself At This time? No   Have you Recently Had Thoughts About Hurting Someone [name]? No  Are You Planning to Harm Someone at This Time? No  Explanation: Pt denies HI   Have You Used Any Alcohol or Drugs in the Past 24 Hours? No  How Long Ago Did You Use Drugs or Alcohol? No What Did You Use and How Much? No   Do You Currently Have a Therapist/Psychiatrist? Yes  Name of Therapist/Psychiatrist: [facility]   Have You Been Recently Discharged From Any Office Practice or Programs? No  Explanation of Discharge From Practice/Program: n/a    CCA Screening Triage Referral Assessment Type of Contact: Face-to-Face  Telemedicine Service Delivery:   Is this Initial or Reassessment?   Date Telepsych consult ordered in [facility]:    Time Telepsych consult ordered in [facility]:    Location of Assessment: [facility] ED  Provider Location: [facility] Assessment Services    Collateral Involvement: none   Does Patient Have a Automotive engineer Guardian? N/a Name and Contact of Legal Guardian: Legal Guardian Vinie Roughen [phone]).  If Minor and Not Living with Parent(s),  Who has Custody? n/a  Is CPS involved or ever been involved? Never  Is APS involved or ever been involved? Never   Patient Determined To Be At Risk for Harm To Self or Others Based on Review of Patient Reported Information or Presenting Complaint? No  Method: No Plan  Availability of Means: No access or NA  Intent: Vague intent or NA  Notification Required: No need or identified person  Additional Information for Danger to Others Potential: -- (n/a)  Additional Comments for Danger to Others Potential: n/a  Are There Guns or Other Weapons in Your Home? No  Types of Guns/Weapons: No access  Are These Weapons Safely Secured?                            Yes  Who Could Verify You Are Able To Have These Secured: No access  Do You Have any Outstanding Charges, Pending Court Dates, Parole/Probation? Pt denies pending legal charges  Contacted To Inform of Risk of Harm To Self or Others: Other: Comment (n/a)   Does Patient Present under Involuntary Commitment? No    [place] of Residence: [place]   Patient Currently Receiving the Following Services: ACTT Psychologist, educational)   Determination of Need: Routine (7 days)   Options For Referral: Intensive Outpatient Therapy   Disposition Recommendation per psychiatric provider: There are no psychiatric contraindications to discharge at this time  Rosina PARAS, [place], [facility]

## 2023-07-14 NOTE — Consult Note (Signed)
 [facility] Psychiatric Consult Initial  Patient Name: .Kenneth Jensen  MRN: [id number]  DOB: February 21, 1990  Consult Order details:  Orders (From admission, onward)     Start     Ordered   07/14/23 0525  CONSULT TO CALL ACT TEAM       Ordering Provider: Viviann Pastor, MD  Provider:  (Not yet assigned)  Question:  Reason for Consult?  Answer:  Psych consult   07/14/23 0524   07/14/23 0525  IP CONSULT TO PSYCHIATRY       Ordering Provider: Viviann Pastor, MD  Provider:  (Not yet assigned)  Question Answer Comment  Consult Timeframe URGENT - requires response within 12 hours   URGENT timeframe requires provider to provider communication, has the provider to provider communication been completed Yes   Reason for Consult? Consult for medication management   Contact phone number where the requesting provider can be reached [phone]      07/14/23 0524             Mode of Visit: In person    Psychiatry Consult Evaluation  Service Date: July 14, 2023 LOS:  LOS: 0 days  Chief Complaint   Primary Psychiatric Diagnoses  Malingering, Antisocial personality disorder Hx Schizophrenia,paranoid  Stimulant use disorder Cannabis use disorder  Assessment  Kenneth Jensen is a 33 y.o. male admitted: with hx of Schizophrenia came to do Ed for being angry and want to hit people in his apartment. Psychiatry is consulted for safety eval   On assessment patient reports feeling tired and wanting to sleep.  Patient is seen in ED everyday for different complaints and after good sleep and food he leaves . At this point patient is not engaging in any self harm behaviors and not violent.Patient's statements of wanting to hurt people in his apartment is not supported by any acute psychiatric presentation and its towards his antisocial personality and malingering. TTS counselor will reach out to the legal guardian.  Inpatient psychiatric admission will be a highly restrictive setting at  this time.  Patient is psychiatrically at baseline and cleared for discharge with recommendation to follow-up with his outpatient ACT team through [facility]    Diagnoses:  Active Hospital problems: Active Problems:   * No active hospital problems. *    Plan   ## Psychiatric Medication Recommendations:  Continue home meds  ## Medical Decision Making Capacity: Not specifically addressed in this encounter  ## Further Work-up:  No further medical work up needed   ## Disposition:-- There are no psychiatric contraindications to discharge at this time  ## Behavioral / Environmental: -Utilize compassion and acknowledge the patient's experiences while setting clear and realistic expectations for care.    ## Safety and Observation Level:  - Based on my clinical evaluation, I estimate the patient to be at LOW risk of self harm in the current setting. - At this time, we recommend  routine. This decision is based on my review of the chart including patient's history and current presentation, interview of the patient, mental status examination, and consideration of suicide risk including evaluating suicidal ideation, plan, intent, suicidal or self-harm behaviors, risk factors, and protective factors. This judgment is based on our ability to directly address suicide risk, implement suicide prevention strategies, and develop a safety plan while the patient is in the clinical setting. Please contact our team if there is a concern that risk level has changed.  CSSR Risk Category:C-SSRS RISK CATEGORY: No Risk  Suicide Risk Assessment: Patient  has following modifiable risk factors for suicide:  none identified at this time which we are addressing by [facility] ACT team to meet patient needs in the community. Patient has following non-modifiable or demographic risk factors for suicide: male gender Patient has the following protective factors against suicide: Access to outpatient mental health care and no history of  suicide attempts  Thank you for this consult request. Recommendations have been communicated to the primary team.  We will sign off at this time.   Allyn Foil, MD       History of Present Illness   Kenneth Jensen is a 33 y.o. male admitted: with hx of Schizophrenia came to do Ed for being angry and want to hit people in his apartment. Psychiatry is consulted for safety eval.  Patient Report:  Patient is noted to be resting in bed.  He looked at the providers and closed his eyes and started snoring.  He will wake up immediately but then go back to sleep.  A lot of prompting he reports that he is upset on the people in his apartment as they are using his money.  When provider asked what kind of money is being misused he gets upset.  Patient is well-known to the emergency room and patient has a legal guardian who manages his finances.  Patient reports that he got upset and wanted to hurt the people in the group home and that is why he brought himself to the emergency room.  He denies acting on any of those thoughts.  He denies having any legal charges for any violent acts. He denies any thoughts of SI and denies hallucinations. Psych ROS:  Depression: Denies Anxiety: Denies Mania (lifetime and current): No grandiose delusions noted Psychosis: (lifetime and current): Not responding to internal stimuli  Collateral information:   Kenneth Jensen [phone] is a legal guardian-TTS counselor is contacting the legal guardian.    Psychiatric and Social History  Psychiatric History:  Information collected from patient and chart  Prev Dx/Sx: Schizophrenia Current Psych Provider: [facility] Home Meds (current): Depakote , Haldol , Invega , Seroquel , trazodone , Minipress  Previous Med Trials: Invega  Sustenna Therapy: [facility]   Prior Psych Hospitalization: Many years ago Prior Self Harm: Denies Prior Violence: Denies   Family Psych History: Unknown Family Hx suicide: Unknown   Social History:   Educational Hx: Unknown Occupational Hx: On disability Legal Hx: None reported Living Situation: Has a legal guardian Spiritual Hx: None reported Access to weapons/lethal means: Denies   Substance History Alcohol: Denies   Tobacco: Denies Illicit drugs: Cannabis intermittent use, has history of cocaine use Prescription drug abuse: Denies Rehab hx: Reported  Exam Findings  Physical Exam: Reviewed and agree with the physical exam findings conducted by the ED provider Vital Signs:  Temp:  [99 F (37.2 C)] 99 F (37.2 C) (07/08 0424) Pulse Rate:  [90] 90 (07/08 0424) Resp:  [18] 18 (07/08 0424) BP: (140)/(105) 140/105 (07/08 0424) SpO2:  [98 %] 98 % (07/08 0424) Blood pressure (!) 140/105, pulse 90, temperature 99 F (37.2 C), temperature source Oral, resp. rate 18, SpO2 98%. There is no height or weight on file to calculate BMI.    Mental Status Exam: General Appearance: Casual  Orientation:  Negative  Memory:  Immediate;   Fair Recent;   Fair Remote;   Fair  Concentration:  Concentration: Poor and Attention Span: Poor  Recall:  Fair  Attention  Poor  Eye Contact:  Minimal  Speech:  Normal Rate  Language:  Fair  Volume:  Increased  Mood: need to sleep  Affect:  Appropriate  Thought Process:  Coherent  Thought Content:  Illogical  Suicidal Thoughts:  No  Homicidal Thoughts:  No  Judgement:  Impaired  Insight:  Shallow  Psychomotor Activity:  Normal  Akathisia:  No  Fund of Knowledge:  Poor      Assets:  Resilience  Cognition:  WNL  ADL's:  Intact  AIMS (if indicated):        Other History   These have been pulled in through the EMR, reviewed, and updated if appropriate.  Family History:  The patient's family history is not on file.  Medical History: Past Medical History:  Diagnosis Date   Asthma    Bipolar 1 disorder (HCC)    Depression    Schizophrenia (HCC)    Schizotaxia     Surgical History: History reviewed. No pertinent surgical  history.   Medications:   Current Facility-Administered Medications:    alum & mag hydroxide-simeth (MAALOX/MYLANTA) 200-200-20 MG/5ML suspension 30 mL, 30 mL, Oral, Q6H PRN, Viviann Pastor, MD   ibuprofen  (ADVIL ) tablet 600 mg, 600 mg, Oral, Q8H PRN, Viviann Pastor, MD   ondansetron  (ZOFRAN ) tablet 4 mg, 4 mg, Oral, Q8H PRN, Viviann Pastor, MD  Current Outpatient Medications:    divalproex  (DEPAKOTE  ER) 500 MG 24 hr tablet, Take 2 tablets (1,000 mg total) by mouth at bedtime for 14 days., Disp: 28 tablet, Rfl: 0   nicotine  polacrilex (NICORETTE ) 2 MG gum, Take 1 each (2 mg total) by mouth as needed for smoking cessation., Disp: 100 tablet, Rfl: 0   paliperidone  (INVEGA  SUSTENNA) 234 MG/1.5ML injection, Inject 234 mg into the muscle every 28 (twenty-eight) days., Disp: 1.8 mL, Rfl: 1   paliperidone  (INVEGA ) 3 MG 24 hr tablet, Take 3 mg by mouth at bedtime., Disp: , Rfl:    prazosin  (MINIPRESS ) 1 MG capsule, Take 1 capsule (1 mg total) by mouth at bedtime., Disp: 14 capsule, Rfl: 0   QUEtiapine  (SEROQUEL ) 200 MG tablet, Take 200 mg by mouth at bedtime., Disp: , Rfl:    benztropine  (COGENTIN ) 0.5 MG tablet, Take 0.5 mg by mouth daily. (Patient not taking: Reported on 07/14/2023), Disp: , Rfl:    haloperidol  (HALDOL ) 10 MG tablet, Take 10 mg by mouth 3 (three) times daily. (Patient not taking: Reported on 07/14/2023), Disp: , Rfl:    traZODone  (DESYREL ) 50 MG tablet, Take 1 tablet (50 mg total) by mouth at bedtime as needed for up to 14 days for sleep., Disp: 14 tablet, Rfl: 0  Allergies: Allergies  Allergen Reactions   Penicillins Anaphylaxis   Amoxicillin Swelling   Percocet [Oxycodone-Acetaminophen ]    Vicodin [Hydrocodone-Acetaminophen ] Itching   Vicodin [Hydrocodone-Acetaminophen ]     Skii Cleland, MD

## 2023-07-14 NOTE — ED Notes (Signed)
 RN given the ACT team leader Evalene [phone]

## 2023-07-14 NOTE — ED Notes (Signed)
 RN attempted to call Evalene from [facility] ACT team. Call unsuccessful.

## 2023-07-14 NOTE — ED Notes (Signed)
 Timothy from [facility] ACT team called and is able to pick pt up from hospital. MD aware. Pt's legal guardian called. Cassandra (legal guardian) given update that Timothy from ACT team will be picking up pt. Guardian gave permission for pt to go back to his apartment.

## 2023-07-14 NOTE — ED Notes (Signed)
 Pt. Alert and oriented, warm and dry, in no distress. Pt. Denies  HI, and AH. Pt states having SI but can't tell this writer what plan is As patient continues to ramble he starts talking about his grandmothers funeral and how she was moving in the casket. Writer asked patient if he was possibly having VH. Patient denies and states No she was moving I will take a lie detector test. Pt. Encouraged to let nursing staff know of any concerns or needs.   ENVIRONMENTAL ASSESSMENT Potentially harmful objects out of patient reach: Yes.   Personal belongings secured: Yes.   Patient dressed in hospital provided attire only: Yes.   Plastic bags out of patient reach: Yes.   Patient care equipment (cords, cables, call bells, lines, and drains) shortened, removed, or accounted for: Yes.   Equipment and supplies removed from bottom of stretcher: Yes.   Potentially toxic materials out of patient reach: Yes.   Sharps container removed or out of patient reach: Yes.

## 2023-07-14 NOTE — ED Notes (Signed)
Pt given lunch tray by EDT.

## 2023-07-14 NOTE — ED Notes (Addendum)
 SABRA

## 2023-07-14 NOTE — ED Notes (Signed)
 Pt sleeping. Symmetrical and regular respirations observed.

## 2023-07-14 NOTE — ED Triage Notes (Addendum)
 Pt to ED via [facility] voluntarily for psych eval. Pt reports he wants nothing to do with the people in his apartment. Says he would get a gun and overkill. Also says his nerves are bad and doesn't have any more xanax for anxiety. Denies SI. Hx bipolar, schizo. Pt rambling about grandmothers funeral.

## 2023-07-14 NOTE — ED Notes (Signed)
 RN spoke with Kenneth Jensen (legal guardian), Kenneth Jensen stated that she wants RN to contact ACT team with [facility] about transporting pt back to apartment. Kenneth Jensen gave consent for pt to be transported back via cab if ACT team was unable to provide transport for pt.

## 2023-07-14 NOTE — ED Notes (Signed)
 RN attempted to contact Kenneth Jensen (legal guardian). Attempt unsuccessful.

## 2023-07-14 NOTE — ED Notes (Addendum)
 Pt dressed out into burgundy scrubs with this tech and Carlotta, RN in the rm. Pt belongings consist of one black cell phone with a cracked screen, one black cell phone charger, red/white tennis shoes, one black durag, red.orange pants, one black t-shirt, a lanyard with two keys and a red/black wallet, blue socks, gray boxers, one black jacket and a black wallet. Pt cooperative while dressing out. Pt belongings placed into two pt belonging bags and labeled with pt name.

## 2023-07-14 NOTE — ED Provider Notes (Signed)
 [facility] Provider Note    Event Date/Time   First MD Initiated Contact with Patient 07/14/23 [phone]     (approximate)   History   Chief Complaint: Psychiatric Evaluation   HPI  TIP ATIENZA is a 33 y.o. male with a history of schizophrenia, substance abuse who comes ED complaining of wanting to kill people in his apartment.  Also feels anxious.     Physical Exam   Triage Vital Signs: ED Triage Vitals [07/14/23 0424]  Encounter Vitals Group     BP (!) 140/105     Girls Systolic BP Percentile      Girls Diastolic BP Percentile      Boys Systolic BP Percentile      Boys Diastolic BP Percentile      Pulse Rate 90     Resp 18     Temp 99 F (37.2 C)     Temp Source Oral     SpO2 98 %     Weight      Height      Head Circumference      Peak Flow      Pain Score 0     Pain Loc      Pain Education      Exclude from Growth Chart     Most recent vital signs: Vitals:   07/14/23 0424  BP: (!) 140/105  Pulse: 90  Resp: 18  Temp: 99 F (37.2 C)  SpO2: 98%    General: Awake, no distress. CV:  Good peripheral perfusion.  Resp:  Normal effort.  Abd:  No distention.  Other:  No wounds   ED Results / Procedures / Treatments   Labs (all labs ordered are listed, but only abnormal results are displayed) Labs Reviewed  CBC - Abnormal; Notable for the following components:      Result Value   WBC 11.2 (*)    All other components within normal limits  URINE DRUG SCREEN, QUALITATIVE ([facility] ONLY) - Abnormal; Notable for the following components:   Cocaine Metabolite,Ur [place] POSITIVE (*)    Cannabinoid 50 Ng, Ur [place] POSITIVE (*)    All other components within normal limits  COMPREHENSIVE METABOLIC PANEL WITH GFR  ETHANOL     EKG    RADIOLOGY    PROCEDURES:  Procedures   MEDICATIONS ORDERED IN ED: Medications  ibuprofen  (ADVIL ) tablet 600 mg (has no administration in time range)  ondansetron  (ZOFRAN ) tablet 4 mg (has  no administration in time range)  alum & mag hydroxide-simeth (MAALOX/MYLANTA) 200-200-20 MG/5ML suspension 30 mL (has no administration in time range)     IMPRESSION / MDM / ASSESSMENT AND PLAN / ED COURSE  I reviewed the triage vital signs and the nursing notes.  Patient's presentation is most consistent with exacerbation of chronic illness.  Patient presents with HI in the setting of polysubstance abuse and existing psychotic disorders.  He is voluntary.  Does not require IVC due to having a guardian.  Will consult psychiatry.  He is medically stable.  The patient has been placed in psychiatric observation due to the need to provide a safe environment for the patient while obtaining psychiatric consultation and evaluation, as well as ongoing medical and medication management to treat the patient's condition.  The patient has not been placed under full IVC at this time.      FINAL CLINICAL IMPRESSION(S) / ED DIAGNOSES   Final diagnoses:  Polysubstance abuse (HCC)  Schizophrenia, unspecified type (  HCC)     Rx / DC Orders   ED Discharge Orders     None        Note:  This document was prepared using Dragon voice recognition software and may include unintentional dictation errors.   Viviann Pastor, MD 07/14/23 [phone]

## 2023-07-14 NOTE — Discharge Instructions (Signed)
You have been seen in the Emergency Department (ED) today for a psychiatric complaint.  You have been evaluated by psychiatry and we believe you are safe to be discharged from the hospital.   ° °Please return to the ED immediately if you have ANY thoughts of hurting yourself or anyone else, so that we may help you. ° °Please avoid alcohol and drug use. ° °Follow up with your doctor and/or therapist as soon as possible regarding today's ED visit.   Please follow up any other recommendations and clinic appointments provided by the psychiatry team that saw you in the Emergency Department. ° °

## 2023-07-28 ENCOUNTER — Emergency Department
Admission: EM | Admit: 2023-07-28 | Discharge: 2023-07-29 | Disposition: A | Discharge status: Home / Self Care | Payer: Medicare (Managed Care) | Attending: Emergency Medicine | Admitting: Emergency Medicine

## 2023-07-28 ENCOUNTER — Other Ambulatory Visit: Payer: Self-pay

## 2023-07-28 DIAGNOSIS — J45909 Unspecified asthma, uncomplicated: Secondary | ICD-10-CM | POA: Diagnosis not present

## 2023-07-28 DIAGNOSIS — F1290 Cannabis use, unspecified, uncomplicated: Secondary | ICD-10-CM | POA: Insufficient documentation

## 2023-07-28 DIAGNOSIS — F600 Paranoid personality disorder: Secondary | ICD-10-CM | POA: Diagnosis not present

## 2023-07-28 DIAGNOSIS — F1590 Other stimulant use, unspecified, uncomplicated: Secondary | ICD-10-CM | POA: Diagnosis not present

## 2023-07-28 DIAGNOSIS — Z79899 Other long term (current) drug therapy: Secondary | ICD-10-CM | POA: Insufficient documentation

## 2023-07-28 DIAGNOSIS — F250 Schizoaffective disorder, bipolar type: Secondary | ICD-10-CM | POA: Insufficient documentation

## 2023-07-28 DIAGNOSIS — R443 Hallucinations, unspecified: Secondary | ICD-10-CM

## 2023-07-28 LAB — CBC
HCT: 40.9 % (ref 39.0–52.0)
Hemoglobin: 13.6 g/dL (ref 13.0–17.0)
MCH: 29.1 pg (ref 26.0–34.0)
MCHC: 33.3 g/dL (ref 30.0–36.0)
MCV: 87.4 fL (ref 80.0–100.0)
Platelets: 311 K/uL (ref 150–400)
RBC: 4.68 MIL/uL (ref 4.22–5.81)
RDW: 13.6 % (ref 11.5–15.5)
WBC: 10.4 K/uL (ref 4.0–10.5)
nRBC: 0 % (ref 0.0–0.2)

## 2023-07-28 LAB — COMPREHENSIVE METABOLIC PANEL WITH GFR
ALT: 14 U/L (ref 0–44)
AST: 20 U/L (ref 15–41)
Albumin: 4.2 g/dL (ref 3.5–5.0)
Alkaline Phosphatase: 54 U/L (ref 38–126)
Anion gap: 14 (ref 5–15)
BUN: 13 mg/dL (ref 6–20)
CO2: 20 mmol/L — ABNORMAL LOW (ref 22–32)
Calcium: 8.8 mg/dL — ABNORMAL LOW (ref 8.9–10.3)
Chloride: 99 mmol/L (ref 98–111)
Creatinine, Ser: 0.84 mg/dL (ref 0.61–1.24)
GFR, Estimated: >60 mL/min (ref >60)
Glucose, Bld: 83 mg/dL (ref 70–99)
Potassium: 4 mmol/L (ref 3.5–5.1)
Sodium: 133 mmol/L — ABNORMAL LOW (ref 135–145)
Total Bilirubin: 1 mg/dL (ref 0.0–1.2)
Total Protein: 7.7 g/dL (ref 6.5–8.1)

## 2023-07-28 LAB — ETHANOL: Alcohol, Ethyl (B): <15 mg/dL (ref <15)

## 2023-07-28 NOTE — BH Assessment (Signed)
 Comprehensive Clinical Assessment (CCA) Screening, Triage and Referral Note  07/28/2023 Kenneth Jensen [id number] Recommendations for Services/Supports/Treatments: Consulted with NP Kenneth HERO., who recommended pt. for overnight observation and reassessment in the AM.   Kenneth Jensen is a 33 year old, English speaking, Black male. Pt presented to [facility] ED Vol from home with complaints of auditory hallucinations. Per triage note: Pt to ED via POV for psych eval. Hx of bipolar, schizo. Has been taking his meds. Denies SI, HI, hallucinations. Says he feels uncomfortable and wants to detox from cocaine. Last use was a few days ago. Pt with flight of ideas and rambling in triage.  Patient presented to the ED citing distress related to housing and unusual beliefs. Subjective: Patient was found resting in bed with covers pulled over his head. When asked how he was feeling, he stated, "I'm surviving." He identified his primary stressor as dissatisfaction with his housing and neighborhood. Patient reported, "I seen crazy stuff on YouTube about my grandmother; she got reborn & she white now." He admitted to cannabis use earlier today but became defensive when asked about other substance use. Patient expressed a desire to be transferred to [facility] and requested housing assistance. Objective: Appearance: Neglected Orientation: Alert and oriented to person, place, situation, and time (x4) Mood: Irritable Affect: Congruent Speech: Normal rate and rhythm Psychomotor Activity: Unremarkable Thought Process: Flight of ideas, loose associations Thought Content: Irrelevant Perceptual Disturbances: Denies auditory/visual hallucinations Safety Assessment: Denies suicidal ideation (SI), homicidal ideation (HI), and hallucinations (AV/H) Assessment: Patient presents with disorganized thought processes, irritability, and poor self-care. Cannabis use may be contributing to altered mental  status. Defensive behavior regarding substance use suggests possible polysubstance involvement. No acute safety concerns identified at this time.   Chief Complaint:  Chief Complaint  Patient presents with   Psychiatric Evaluation   Visit Diagnosis: Schizophrenia,paranoid 2.  Stimulant use disorder 3.  Cannabis use disorder  Patient Reported Information How did you hear about us ? Other (Comment) [facility] ED)  What Is the Reason for Your Visit/Call Today? Per EDP's note:Pt is a 33 y.o. male with a history of schizophrenia, substance abuse who comes ED complaining of wanting to kill people in his apartment.  Also feels anxious.  How Long Has This Been Causing You Problems? <Week  What Do You Feel Would Help You the Most Today? Treatment for Depression or other mood problem; Stress Management; Medication(s)   Have You Recently Had Any Thoughts About Hurting Yourself? No  Are You Planning to Commit Suicide/Harm Yourself At This time? No   Have you Recently Had Thoughts About Hurting Someone [name]? No  Are You Planning to Harm Someone at This Time? No  Explanation: Pt denies HI   Have You Used Any Alcohol or Drugs in the Past 24 Hours? No  How Long Ago Did You Use Drugs or Alcohol? No data recorded What Did You Use and How Much? Alcohol; amount unknown   Do You Currently Have a Therapist/Psychiatrist? Yes  Name of Therapist/Psychiatrist: [facility]   Have You Been Recently Discharged From Any Office Practice or Programs? No  Explanation of Discharge From Practice/Program: n/a    CCA Screening Triage Referral Assessment Type of Contact: Face-to-Face  Telemedicine Service Delivery:   Is this Initial or Reassessment?   Date Telepsych consult ordered in [facility]:    Time Telepsych consult ordered in [facility]:    Location of Assessment: [facility] ED  Provider Location: [facility] Assessment Services    Collateral Involvement: none  Does Patient Have a Automotive engineer Guardian?  No data recorded Name and Contact of Legal Guardian: No data recorded If Minor and Not Living with Parent(s), Who has Custody? n/a  Is CPS involved or ever been involved? Never  Is APS involved or ever been involved? Never   Patient Determined To Be At Risk for Harm To Self or Others Based on Review of Patient Reported Information or Presenting Complaint? No  Method: No Plan  Availability of Means: No access or NA  Intent: Vague intent or NA  Notification Required: No need or identified person  Additional Information for Danger to Others Potential: -- (n/a)  Additional Comments for Danger to Others Potential: n/a  Are There Guns or Other Weapons in Your Home? No  Types of Guns/Weapons: No access  Are These Weapons Safely Secured?                            Yes  Who Could Verify You Are Able To Have These Secured: No access  Do You Have any Outstanding Charges, Pending Court Dates, Parole/Probation? Pt denies pending legal charges  Contacted To Inform of Risk of Harm To Self or Others: Other: Comment (n/a)   Does Patient Present under Involuntary Commitment? No    [place] of Residence: [place]   Patient Currently Receiving the Following Services: ACTT Psychologist, educational)   Determination of Need: Routine (7 days)   Options For Referral: Intensive Outpatient Therapy   Disposition Recommendation per psychiatric provider: Overnight observation; reassess in the AM.   Manjit Bufano R Zebulin Siegel, LCAS

## 2023-07-28 NOTE — ED Notes (Signed)
 Pt dressed out by this tech and EDT Harlene, Patient belongings include:  black shirt, gray joggers, black socks, orange slides, black jacket, black bookbag, 2 black bracelets, black phone, blue underwear, black wallet, charger, cards.

## 2023-07-28 NOTE — ED Notes (Signed)
 Pt reports hearing voices.  Pt also reports cocaine use and marijuana use.  Pt last used drugs today.  Denies etoh use.  Pt also states he needs to be checked for std's.  Pt is in hallway bed.   Pt cooperative.

## 2023-07-28 NOTE — ED Triage Notes (Addendum)
 Pt to ED via POV for psych eval. Hx of bipolar, schizo. Has been taking his meds. Denies SI, HI, hallucinations. Says he feels uncomfortable and wants to detox from cocaine. Last use was a few days ago. Pt with flight of ideas and rambling in triage.

## 2023-07-28 NOTE — ED Provider Notes (Signed)
 [facility] ([facility]) Provider Note    Event Date/Time   First MD Initiated Contact with Patient 07/28/23 1944     (approximate)   History   Chief Complaint Psychiatric Evaluation   HPI  Kenneth Jensen is a 33 y.o. male with past medical history of schizophrenia, bipolar disorder, asthma, and cocaine abuse who presents to the ED complaining of hallucinations.  Patient reports that he has been hearing voices and seeing people who are not there, feels like he needs to be admitted to a psychiatric hospital.  He denies any thoughts of harming himself or others, but came to the hospital so that he could stay safe.  He states he has been compliant with his medications, does admit to cocaine use a few days ago and marijuana use today.  He does report concern for sexually transmitted infection, but denies any symptoms.     Physical Exam   Triage Vital Signs: ED Triage Vitals  Encounter Vitals Group     BP 07/28/23 1939 103/79     Girls Systolic BP Percentile --      Girls Diastolic BP Percentile --      Boys Systolic BP Percentile --      Boys Diastolic BP Percentile --      Pulse Rate 07/28/23 1939 82     Resp 07/28/23 1939 16     Temp 07/28/23 1939 98.3 F (36.8 C)     Temp Source 07/28/23 1939 Oral     SpO2 07/28/23 1939 99 %     Weight --      Height 07/28/23 1934 5' 1 (1.549 m)     Head Circumference --      Peak Flow --      Pain Score 07/28/23 1933 0     Pain Loc --      Pain Education --      Exclude from Growth Chart --     Most recent vital signs: Vitals:   07/28/23 1939  BP: 103/79  Pulse: 82  Resp: 16  Temp: 98.3 F (36.8 C)  SpO2: 99%    Constitutional: Alert and oriented. Eyes: Conjunctivae are normal. Head: Atraumatic. Nose: No congestion/rhinnorhea. Mouth/Throat: Mucous membranes are moist.  Cardiovascular: Normal rate, regular rhythm. Grossly normal heart sounds.  2+ radial pulses bilaterally. Respiratory: Normal  respiratory effort.  No retractions. Lungs CTAB. Gastrointestinal: Soft and nontender. No distention. Musculoskeletal: No lower extremity tenderness nor edema.  Neurologic:  Normal speech and language. No gross focal neurologic deficits are appreciated.    ED Results / Procedures / Treatments   Labs (all labs ordered are listed, but only abnormal results are displayed) Labs Reviewed  COMPREHENSIVE METABOLIC PANEL WITH GFR - Abnormal; Notable for the following components:      Result Value   Sodium 133 (*)    CO2 20 (*)    Calcium 8.8 (*)    All other components within normal limits  CHLAMYDIA/NGC RT PCR ([facility] ONLY)            ETHANOL  CBC  URINE DRUG SCREEN, QUALITATIVE ([facility] ONLY)    PROCEDURES:  Critical Care performed: No  Procedures   MEDICATIONS ORDERED IN ED: Medications - No data to display   IMPRESSION / MDM / ASSESSMENT AND PLAN / ED COURSE  I reviewed the triage vital signs and the nursing notes.  33 y.o. male with past medical history of schizophrenia, bipolar disorder, cocaine abuse, and asthma who presents to the ED for psychiatric evaluation due to increasing hallucinations.  Patient's presentation is most consistent with acute presentation with potential threat to life or bodily function.  Differential diagnosis includes, but is not limited to, psychosis, depression, anxiety, substance abuse, medication noncompliance.  Patient nontoxic-appearing and in no acute distress, vital signs are unremarkable.  He denies any medical complaints but requests STD testing, which was ordered.  Screening labs without significant anemia, leukocytosis, electrolyte abnormality, or AKI.  LFTs are unremarkable, ethanol level is undetectable.  Patient may be medically cleared for psychiatric disposition, psych eval pending at this time.  He will maintain voluntary status given he is calm and cooperative, denies SI or HI.  The patient has been  placed in psychiatric observation due to the need to provide a safe environment for the patient while obtaining psychiatric consultation and evaluation, as well as ongoing medical and medication management to treat the patient's condition.  The patient has not been placed under full IVC at this time.      FINAL CLINICAL IMPRESSION(S) / ED DIAGNOSES   Final diagnoses:  Hallucinations     Rx / DC Orders   ED Discharge Orders     None        Note:  This document was prepared using Dragon voice recognition software and may include unintentional dictation errors.   [name], MD 07/28/23 2023

## 2023-07-28 NOTE — ED Notes (Signed)
 Pt given snack and drink at this time. Pt calm and cooperative.

## 2023-07-29 DIAGNOSIS — F29 Unspecified psychosis not due to a substance or known physiological condition: Secondary | ICD-10-CM | POA: Diagnosis not present

## 2023-07-29 DIAGNOSIS — F250 Schizoaffective disorder, bipolar type: Secondary | ICD-10-CM | POA: Diagnosis not present

## 2023-07-29 LAB — URINE DRUG SCREEN, QUALITATIVE (ARMC ONLY)
Amphetamines, Ur Screen: NOT DETECTED
Barbiturates, Ur Screen: NOT DETECTED
Benzodiazepine, Ur Scrn: NOT DETECTED
Cannabinoid 50 Ng, Ur ~~LOC~~: POSITIVE — AB
Cocaine Metabolite,Ur ~~LOC~~: POSITIVE — AB
MDMA (Ecstasy)Ur Screen: NOT DETECTED
Methadone Scn, Ur: NOT DETECTED
Opiate, Ur Screen: NOT DETECTED
Phencyclidine (PCP) Ur S: NOT DETECTED
Tricyclic, Ur Screen: NOT DETECTED

## 2023-07-29 LAB — CHLAMYDIA/NGC RT PCR (ARMC ONLY)
Chlamydia Tr: NOT DETECTED
N gonorrhoeae: NOT DETECTED

## 2023-07-29 MED ORDER — PALIPERIDONE ER 3 MG PO TB24
3.0000 mg | ORAL_TABLET | Freq: Once | ORAL | Status: AC
  Administered 2023-07-29: 3 mg via ORAL
  Filled 2023-07-29: qty 1

## 2023-07-29 NOTE — ED Notes (Signed)
 Vol /psych consult pending /per TTS psych provider recommend  patient for overnight observation and reassess this am

## 2023-07-29 NOTE — Discharge Instructions (Signed)
Return to the ER for new or worsening symptoms. °

## 2023-07-29 NOTE — ED Notes (Signed)
 Pt breakfasts Provided at bedside

## 2023-07-29 NOTE — ED Notes (Signed)
 Pt lunch given at bedside

## 2023-07-29 NOTE — ED Notes (Signed)
VOL pending disposition 

## 2023-07-29 NOTE — Consult Note (Signed)
 [facility] Psychiatric Consult Initial  Patient Name: .[name]  MRN: [id number]  DOB: [birth year]  Consult Order details:  Orders (From admission, onward)     Start     Ordered   07/28/23 2012  IP CONSULT TO PSYCHIATRY       Ordering Provider: [name], MD  Provider:  (Not yet assigned)  Question Answer Comment  Place call to: [phone]   Reason for Consult Admit      07/28/23 2011   07/28/23 2012  CONSULT TO CALL ACT TEAM       Ordering Provider: [name], MD  Provider:  (Not yet assigned)  Question:  Reason for Consult?  Answer:  Hallucinations   07/28/23 2011             Mode of Visit: Tele-visit Virtual Statement:TELE PSYCHIATRY ATTESTATION & CONSENT As the provider for this telehealth consult, I attest that I verified the patient's identity using two separate identifiers, introduced myself to the patient, provided my credentials, disclosed my location, and performed this encounter via a HIPAA-compliant, real-time, face-to-face, two-way, interactive audio and video platform and with the full consent and agreement of the patient (or guardian as applicable.) Patient physical location: [facility]. Telehealth provider physical location: home office in state of [place] .   Video start time:   Video end time:      Psychiatry Consult Evaluation  Service Date: July 29, 2023 LOS:  LOS: 0 days  Chief Complaint Hallucinations  Primary Psychiatric Diagnoses  Schizophrenia 2.  Bipolar   Assessment  Kenneth Jensen is a 33 y.o. male admitted: Presented to the EDfor 07/28/2023  7:44 PM for disorganized thinking and possible hallucinations. He carries the psychiatric diagnoses of schizophrenia and bipolar disorder, and has a past medical history of asthma and polysubstance use involving marijuana and cocaine.  His current presentation of disorganized thought processes, delusional beliefs, and medication noncompliance is most  consistent with an acute exacerbation of schizophrenia with residual psychotic features. He meets criteria for overnight psychiatric observation as a known patient this is his baseline behavior. Patient has impaired functioning, poor insight, reported medication non-adherence, and disorganized behavior.  Current outpatient psychotropic medications include haldol , and historically he has had a partial or unclear response to antipsychotics and mood stabilizers. He was non-compliant with medications prior to admission as evidenced by his own report of not taking any medications and not having a psychiatrist or therapist. However patient does have an ACT team. On initial examination, patient was disorganized, anxious, and guarded, with scattered responses.   Diagnoses:  Active Hospital problems: Active Problems:   * No active hospital problems. *    Plan   ## Psychiatric Medication Recommendations:  Invega  3mg  qhs  ## Medical Decision Making Capacity: Patient has a guardian and has thus been adjudicated incompetent; please involve patients guardian in medical decision making  ## Disposition:-- Patient recommendation -overnight observation  ## Behavioral / Environmental: - No specific recommendations at this time.    ## Safety and Observation Level:  - Based on my clinical evaluation, I estimate the patient to be at low risk of self harm in the current setting. - At this time, we recommend  routine. This decision is based on my review of the chart including patient's history and current presentation, interview of the patient, mental status examination, and consideration of suicide risk including evaluating suicidal ideation, plan, intent, suicidal or self-harm behaviors, risk factors, and protective factors. This judgment is based  on our ability to directly address suicide risk, implement suicide prevention strategies, and develop a safety plan while the patient is in the clinical setting. Please  contact our team if there is a concern that risk level has changed.  CSSR Risk Category:C-SSRS RISK CATEGORY: No Risk  Suicide Risk Assessment: Patient has following modifiable risk factors for suicide: medication noncompliance, which we are addressing by patient education. Patient has following non-modifiable or demographic risk factors for suicide: male gender Patient has the following protective factors against suicide: Access to outpatient mental health care  Thank you for this consult request. Recommendations have been communicated to the primary team.  We will recommend overnight observation at this time.   Chessica Audia, NP       History of Present Illness  Relevant Aspects of Hospital ED Course:  Admitted on 07/28/2023 for for disorganized thinking and possible hallucinations..   Patient Report:  Kenneth Jensen is a 33 year old male with a psychiatric history of schizophrenia, bipolar disorder, and polysubstance use (cocaine and marijuana), and a medical history of asthma. He presented to the ED with a complaint of hallucinations. During assessment, the patient denied current suicidal ideation (SI), homicidal ideation (HI), or active auditory/visual hallucinations (AVH). However, he described a recent experience while watching YouTube, in which he believed he witnessed his  grandmother being reborn as a Caucasian woman, suggesting possible residual or intermittent psychosis.  The patient reports living alone with his aunt staying with him temporarily. He is unemployed and receives SSI. He denies owning any firearms. He reports marijuana use today and denies the use of other substances. The patient denies being on psychiatric medications and has no current psychiatrist or therapist, stating that they hangup on you when you call them to talk at night. He frequently asked to be transferred to [facility] inpatient facility, expressing a desire for stabilization and future  housing arrangements. He reports not wanting to live in a group home.  Throughout the interview, the patient's responses were disorganized and tangential, and he was guarded about sharing personal information, stating he did not want everyone in his business.  Psych ROS:  Depression: no Anxiety:  yes Mania (lifetime and current): no Psychosis: (lifetime and current): yes   Review of Systems  Constitutional: Negative.   HENT: Negative.    Eyes: Negative.   Respiratory: Negative.    Cardiovascular: Negative.   Gastrointestinal: Negative.   Genitourinary: Negative.   Musculoskeletal: Negative.   Skin: Negative.   Neurological: Negative.   Endo/Heme/Allergies: Negative.   Psychiatric/Behavioral:  Positive for substance abuse.      Psychiatric and Social History  Psychiatric History:  Information collected from Chart history  Prev Dx/Sx: Schizophrenia, bipolar Current Psych Provider: unknown Home Meds (current): unknown Previous Med Trials: unknown Therapy: unknown  Prior Psych Hospitalization: yes  Prior Self Harm: denies Prior Violence: denies  Family Psych History: No pertinent family psych history Family Hx suicide: No family hx of suicide  Social History:  Developmental Hx: unknown Educational Hx: unknown Occupational Hx: unknown Legal Hx: unknown Living Situation: alone Spiritual Hx: unknown Access to weapons/lethal means: no   Substance History Alcohol: denies  Tobacco: denies Illicit drugs: marijuana Prescription drug abuse: denies Rehab hx: denies  Exam Findings   Vital Signs:  Temp:  [98.3 F (36.8 C)] 98.3 F (36.8 C) (07/22 1939) Pulse Rate:  [82] 82 (07/22 1939) Resp:  [16] 16 (07/22 1939) BP: (103)/(79) 103/79 (07/22 1939) SpO2:  [99 %] 99 % (07/22  1939) Blood pressure 103/79, pulse 82, temperature 98.3 F (36.8 C), temperature source Oral, resp. rate 16, height 5' 1 (1.549 m), SpO2 99%. Body mass index is 37.49 kg/m.  Physical  Exam HENT:     Head: Normocephalic.     Nose: Nose normal.     Mouth/Throat:     Pharynx: Oropharynx is clear.  Eyes:     Extraocular Movements: Extraocular movements intact.  Cardiovascular:     Pulses: Normal pulses.  Pulmonary:     Effort: Pulmonary effort is normal.  Musculoskeletal:        General: Normal range of motion.     Cervical back: Normal range of motion.  Skin:    General: Skin is dry.  Neurological:     Mental Status: He is alert. Mental status is at baseline.         Other History   These have been pulled in through the EMR, reviewed, and updated if appropriate.  Family History:  The patient's family history is not on file.  Medical History: Past Medical History:  Diagnosis Date   Asthma    Bipolar 1 disorder (HCC)    Depression    Schizophrenia (HCC)    Schizotaxia     Surgical History: History reviewed. No pertinent surgical history.   Medications:  No current facility-administered medications for this encounter.  Current Outpatient Medications:    benztropine  (COGENTIN ) 0.5 MG tablet, Take 0.5 mg by mouth daily. (Patient not taking: Reported on 07/14/2023), Disp: , Rfl:    divalproex  (DEPAKOTE  ER) 500 MG 24 hr tablet, Take 2 tablets (1,000 mg total) by mouth at bedtime for 14 days., Disp: 28 tablet, Rfl: 0   haloperidol  (HALDOL ) 10 MG tablet, Take 10 mg by mouth 3 (three) times daily. (Patient not taking: Reported on 07/14/2023), Disp: , Rfl:    nicotine  polacrilex (NICORETTE ) 2 MG gum, Take 1 each (2 mg total) by mouth as needed for smoking cessation., Disp: 100 tablet, Rfl: 0   paliperidone  (INVEGA  SUSTENNA) 234 MG/1.5ML injection, Inject 234 mg into the muscle every 28 (twenty-eight) days., Disp: 1.8 mL, Rfl: 1   paliperidone  (INVEGA ) 3 MG 24 hr tablet, Take 3 mg by mouth at bedtime., Disp: , Rfl:    prazosin  (MINIPRESS ) 1 MG capsule, Take 1 capsule (1 mg total) by mouth at bedtime., Disp: 14 capsule, Rfl: 0   QUEtiapine  (SEROQUEL ) 200 MG  tablet, Take 200 mg by mouth at bedtime., Disp: , Rfl:    traZODone  (DESYREL ) 50 MG tablet, Take 1 tablet (50 mg total) by mouth at bedtime as needed for up to 14 days for sleep., Disp: 14 tablet, Rfl: 0  Allergies: Allergies  Allergen Reactions   Penicillins Anaphylaxis   Amoxicillin Swelling   Percocet [Oxycodone-Acetaminophen ]    Vicodin [Hydrocodone-Acetaminophen ] Itching   Vicodin [Hydrocodone-Acetaminophen ]     Wael Maestas, NP

## 2023-07-29 NOTE — ED Notes (Signed)
 Voice mail left on legal guardian vinie nine phone concerning discharge of pt from the ER.  Shanda peak charge nurse aware.
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
## 2023-07-29 NOTE — BH Assessment (Addendum)
 Psych Team met with patient for reassessment. At start of interview, patient was annoyed stating Ya'll are going to discharge me. Patient states he does not like his neighbors constantly asking about his personal business and for that reason he no longer wants to live there. Patient denies current SI/HI/AH/VH. Patient states his primary reason coming to the ED was to get away from neighbors. Patient states he is compliant with his medications and has involvement with his ACT Team.   Per Dr. Donnelly, patient does not meet criteria for inpatient psychiatric admission and can be discharged back home.
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
## 2023-07-29 NOTE — ED Provider Notes (Signed)
 Reviewed note from TTS which notes that patient was cleared by psychiatry for discharge.  Did not yet see a note from psychiatry, so did call Dr. Jadapalle who did confirm that she had evaluated the patient and he was cleared for discharge.  Patient assessed at bedside.  Denies SI, HI, not responding to internal stimuli.  Patient discharged in stable condition with strict return precautions.   Levander Slate, MD 07/29/23 [phone]

## 2023-09-09 ENCOUNTER — Other Ambulatory Visit: Payer: Self-pay

## 2023-09-09 ENCOUNTER — Emergency Department
Admission: EM | Admit: 2023-09-09 | Discharge: 2023-09-09 | Disposition: A | Discharge status: Home / Self Care | Payer: Medicare (Managed Care) | Attending: Emergency Medicine | Admitting: Emergency Medicine

## 2023-09-09 DIAGNOSIS — F1010 Alcohol abuse, uncomplicated: Secondary | ICD-10-CM | POA: Insufficient documentation

## 2023-09-09 DIAGNOSIS — F22 Delusional disorders: Secondary | ICD-10-CM | POA: Diagnosis present

## 2023-09-09 DIAGNOSIS — J45909 Unspecified asthma, uncomplicated: Secondary | ICD-10-CM | POA: Diagnosis not present

## 2023-09-09 DIAGNOSIS — F1090 Alcohol use, unspecified, uncomplicated: Secondary | ICD-10-CM

## 2023-09-09 DIAGNOSIS — F209 Schizophrenia, unspecified: Secondary | ICD-10-CM | POA: Insufficient documentation

## 2023-09-09 DIAGNOSIS — Z79899 Other long term (current) drug therapy: Secondary | ICD-10-CM | POA: Insufficient documentation

## 2023-09-09 DIAGNOSIS — F319 Bipolar disorder, unspecified: Secondary | ICD-10-CM | POA: Diagnosis present

## 2023-09-09 DIAGNOSIS — F200 Paranoid schizophrenia: Secondary | ICD-10-CM | POA: Insufficient documentation

## 2023-09-09 DIAGNOSIS — Z5900 Homelessness unspecified: Secondary | ICD-10-CM | POA: Insufficient documentation

## 2023-09-09 LAB — CBC
HCT: 40.5 % (ref 39.0–52.0)
Hemoglobin: 13.8 g/dL (ref 13.0–17.0)
MCH: 29.5 pg (ref 26.0–34.0)
MCHC: 34.1 g/dL (ref 30.0–36.0)
MCV: 86.5 fL (ref 80.0–100.0)
Platelets: 313 K/uL (ref 150–400)
RBC: 4.68 MIL/uL (ref 4.22–5.81)
RDW: 13 % (ref 11.5–15.5)
WBC: 14.4 K/uL — ABNORMAL HIGH (ref 4.0–10.5)
nRBC: 0 % (ref 0.0–0.2)

## 2023-09-09 LAB — COMPREHENSIVE METABOLIC PANEL WITH GFR
ALT: 11 U/L (ref 0–44)
AST: 23 U/L (ref 15–41)
Albumin: 4.1 g/dL (ref 3.5–5.0)
Alkaline Phosphatase: 57 U/L (ref 38–126)
Anion gap: 16 — ABNORMAL HIGH (ref 5–15)
BUN: 9 mg/dL (ref 6–20)
CO2: 24 mmol/L (ref 22–32)
Calcium: 8.9 mg/dL (ref 8.9–10.3)
Chloride: 98 mmol/L (ref 98–111)
Creatinine, Ser: 0.7 mg/dL (ref 0.61–1.24)
GFR, Estimated: >60 mL/min (ref >60)
Glucose, Bld: 104 mg/dL — ABNORMAL HIGH (ref 70–99)
Potassium: 3.5 mmol/L (ref 3.5–5.1)
Sodium: 138 mmol/L (ref 135–145)
Total Bilirubin: 0.6 mg/dL (ref 0.0–1.2)
Total Protein: 7.4 g/dL (ref 6.5–8.1)

## 2023-09-09 LAB — URINE DRUG SCREEN, QUALITATIVE (ARMC ONLY)
Amphetamines, Ur Screen: NOT DETECTED
Barbiturates, Ur Screen: NOT DETECTED
Benzodiazepine, Ur Scrn: NOT DETECTED
Cannabinoid 50 Ng, Ur ~~LOC~~: POSITIVE — AB
Cocaine Metabolite,Ur ~~LOC~~: POSITIVE — AB
MDMA (Ecstasy)Ur Screen: NOT DETECTED
Methadone Scn, Ur: NOT DETECTED
Opiate, Ur Screen: NOT DETECTED
Phencyclidine (PCP) Ur S: NOT DETECTED
Tricyclic, Ur Screen: NOT DETECTED

## 2023-09-09 LAB — ETHANOL: Alcohol, Ethyl (B): <15 mg/dL (ref <15)

## 2023-09-09 LAB — VALPROIC ACID LEVEL: Valproic Acid Lvl: <10 ug/mL — ABNORMAL LOW (ref 50–100)

## 2023-09-09 LAB — ACETAMINOPHEN LEVEL: Acetaminophen (Tylenol), Serum: <10 ug/mL — ABNORMAL LOW (ref 10–30)

## 2023-09-09 LAB — SALICYLATE LEVEL: Salicylate Lvl: <7 mg/dL — ABNORMAL LOW (ref 7.0–30.0)

## 2023-09-09 MED ORDER — DIVALPROEX SODIUM ER 500 MG PO TB24: 1000.0000 mg | ORAL_TABLET | Freq: Every day | ORAL | Status: DC

## 2023-09-09 MED ORDER — TRAZODONE HCL 50 MG PO TABS
50.0000 mg | ORAL_TABLET | Freq: Every evening | ORAL | Status: DC | PRN
  Administered 2023-09-09: 50 mg via ORAL
  Filled 2023-09-09: qty 1

## 2023-09-09 MED ORDER — HALOPERIDOL 5 MG PO TABS
10.0000 mg | ORAL_TABLET | Freq: Three times a day (TID) | ORAL | Status: DC
  Administered 2023-09-09: 10 mg via ORAL
  Filled 2023-09-09: qty 2

## 2023-09-09 MED ORDER — PRAZOSIN HCL 1 MG PO CAPS
1.0000 mg | ORAL_CAPSULE | Freq: Every day | ORAL | Status: DC
  Administered 2023-09-09: 1 mg via ORAL
  Filled 2023-09-09 (×2): qty 1

## 2023-09-09 MED ORDER — BENZTROPINE MESYLATE 1 MG PO TABS
0.5000 mg | ORAL_TABLET | Freq: Every day | ORAL | Status: DC
  Administered 2023-09-09: 0.5 mg via ORAL
  Filled 2023-09-09: qty 1

## 2023-09-09 MED ORDER — PALIPERIDONE ER 3 MG PO TB24
3.0000 mg | ORAL_TABLET | Freq: Every day | ORAL | Status: DC
  Administered 2023-09-09: 3 mg via ORAL
  Filled 2023-09-09 (×2): qty 1

## 2023-09-09 MED ORDER — QUETIAPINE FUMARATE 200 MG PO TABS
200.0000 mg | ORAL_TABLET | Freq: Every day | ORAL | Status: DC
  Administered 2023-09-09: 200 mg via ORAL
  Filled 2023-09-09: qty 1

## 2023-09-09 NOTE — ED Provider Notes (Signed)
-----------------------------------------   1:27 PM on 09/09/2023 -----------------------------------------  The patient has been cleared by psychiatry for discharge.  He is not under involuntary commitment.  He is stable for discharge at this time.  Return precautions provided.   Jacolyn Pae, MD 09/09/23 1327

## 2023-09-09 NOTE — ED Triage Notes (Signed)
 Pt reports I need to go away to the hospital for a little while so I can get myself together pt reports not trusting people around him and being paranoid. Pt denies SI/HI

## 2023-09-09 NOTE — ED Provider Notes (Signed)
 [facility] Provider Note    Event Date/Time   First MD Initiated Contact with Patient 09/09/23 0113     (approximate)   History   Psychiatric Evaluation   HPI  Kenneth Jensen is a 33 y.o. male with history of schizophrenia, bipolar disorder who presents to the emergency department requesting psychiatric evaluation.  Patient states it is time for me to get off the streets.  He states that he also needs help with alcohol use disorder and that he drank a margarita today.  Denies history of withdrawal seizures.  Per triage note, patient has been paranoid.  Patient denies SI, HI but does not answer me appropriately when asked about hallucinations and appears to be responding to internal stimuli.   History provided by patient.    Past Medical History:  Diagnosis Date   Asthma    Bipolar 1 disorder (HCC)    Depression    Schizophrenia (HCC)    Schizotaxia     History reviewed. No pertinent surgical history.  MEDICATIONS:  Prior to Admission medications   Medication Sig Start Date End Date Taking? Authorizing Provider  benztropine  (COGENTIN ) 0.5 MG tablet Take 0.5 mg by mouth daily. Patient not taking: Reported on 07/14/2023 04/02/23   [provider]  divalproex  (DEPAKOTE  ER) 500 MG 24 hr tablet Take 2 tablets (1,000 mg total) by mouth at bedtime for 14 days. 09/09/22 07/29/23  Massengill, Rankin, MD  haloperidol  (HALDOL ) 10 MG tablet Take 10 mg by mouth 3 (three) times daily. Patient not taking: Reported on 07/14/2023 04/27/23   [provider]  nicotine  polacrilex (NICORETTE ) 2 MG gum Take 1 each (2 mg total) by mouth as needed for smoking cessation. Patient not taking: Reported on 07/29/2023 09/09/22   Johny Rankin, MD  paliperidone  (INVEGA  SUSTENNA) 234 MG/1.5ML injection Inject 234 mg into the muscle every 28 (twenty-eight) days. 06/04/21   Clapacs, Norleen DASEN, MD  paliperidone  (INVEGA ) 3 MG 24 hr tablet Take 3 mg by mouth at  bedtime. 12/09/22   [provider]  prazosin  (MINIPRESS ) 1 MG capsule Take 1 capsule (1 mg total) by mouth at bedtime. 09/09/22 07/29/23  Massengill, Rankin, MD  QUEtiapine  (SEROQUEL ) 200 MG tablet Take 200 mg by mouth at bedtime. 07/09/23   [provider]  traZODone  (DESYREL ) 50 MG tablet Take 1 tablet (50 mg total) by mouth at bedtime as needed for up to 14 days for sleep. 09/09/22 07/10/23  Johny Rankin, MD    Physical Exam   Triage Vital Signs: ED Triage Vitals  Encounter Vitals Group     BP 09/09/23 0054 125/61     Girls Systolic BP Percentile --      Girls Diastolic BP Percentile --      Boys Systolic BP Percentile --      Boys Diastolic BP Percentile --      Pulse Rate 09/09/23 0054 100     Resp 09/09/23 0054 17     Temp 09/09/23 0053 98.3 F (36.8 C)     Temp src --      SpO2 09/09/23 0054 98 %     Weight --      Height --      Head Circumference --      Peak Flow --      Pain Score 09/09/23 0054 0     Pain Loc --      Pain Education --      Exclude from Growth Chart --  Most recent vital signs: Vitals:   09/09/23 0158 09/09/23 0224  BP:  125/61  Pulse:    Resp:    Temp:    SpO2: 98%     CONSTITUTIONAL: Alert, responds appropriately to questions.  Chronically ill-appearing, appears intoxicated HEAD: Normocephalic, atraumatic EYES: Conjunctivae clear, pupils appear equal, sclera nonicteric ENT: normal nose; moist mucous membranes NECK: Supple, normal ROM CARD: RRR; S1 and S2 appreciated RESP: Normal chest excursion without splinting or tachypnea; breath sounds clear and equal bilaterally; no wheezes, no rhonchi, no rales, no hypoxia or respiratory distress, speaking full sentences ABD/GI: Non-distended; soft, non-tender, no rebound, no guarding, no peritoneal signs BACK: The back appears normal EXT: Normal ROM in all joints; no deformity noted, no edema SKIN: Normal color for age and race; warm; no rash on exposed skin NEURO: Moves all  extremities equally, normal speech PSYCH: Bizarre affect.  Responding to internal stimuli.  Denies SI or HI.  Disorganized thoughts.   ED Results / Procedures / Treatments   LABS: (all labs ordered are listed, but only abnormal results are displayed) Labs Reviewed  COMPREHENSIVE METABOLIC PANEL WITH GFR - Abnormal; Notable for the following components:      Result Value   Glucose, Bld 104 (*)    Anion gap 16 (*)    All other components within normal limits  CBC - Abnormal; Notable for the following components:   WBC 14.4 (*)    All other components within normal limits  SALICYLATE LEVEL - Abnormal; Notable for the following components:   Salicylate Lvl <7.0 (*)    All other components within normal limits  ACETAMINOPHEN  LEVEL - Abnormal; Notable for the following components:   Acetaminophen  (Tylenol ), Serum <10 (*)    All other components within normal limits  VALPROIC ACID  LEVEL - Abnormal; Notable for the following components:   Valproic Acid  Lvl <10 (*)    All other components within normal limits  ETHANOL  URINE DRUG SCREEN, QUALITATIVE ([facility] ONLY)     EKG:   RADIOLOGY: My personal review and interpretation of imaging:    I have personally reviewed all radiology reports.   No results found.   PROCEDURES:  Critical Care performed: No      Procedures    IMPRESSION / MDM / ASSESSMENT AND PLAN / ED COURSE  I reviewed the triage vital signs and the nursing notes.    Patient here with alcohol use disorder, schizophrenia, homelessness.     DIFFERENTIAL DIAGNOSIS (includes but not limited to):   Decompensated schizophrenia, psychosis, substance use disorder, homelessness, malingering   Patient's presentation is most consistent with acute presentation with potential threat to life or bodily function.   PLAN: Will obtain screening labs, urine.  Will consult psychiatry and TTS.  Patient here voluntarily.   MEDICATIONS GIVEN IN ED: Medications   traZODone  (DESYREL ) tablet 50 mg (50 mg Oral Given 09/09/23 0224)  QUEtiapine  (SEROQUEL ) tablet 200 mg (200 mg Oral Given 09/09/23 0224)  prazosin  (MINIPRESS ) capsule 1 mg (1 mg Oral Given 09/09/23 0224)  paliperidone  (INVEGA ) 24 hr tablet 3 mg (has no administration in time range)  benztropine  (COGENTIN ) tablet 0.5 mg (has no administration in time range)  divalproex  (DEPAKOTE  ER) 24 hr tablet 1,000 mg (has no administration in time range)  haloperidol  (HALDOL ) tablet 10 mg (has no administration in time range)     ED COURSE: 5:40 AM  Labs show normal electrolytes, renal function.  Ethanol level negative.  Tylenol  salicylate levels negative.  Valproic acid   level undetectable.  Patient medically cleared for psychiatric disposition.   CONSULTS: TTS and psychiatry consulted.   OUTSIDE RECORDS REVIEWED: Reviewed previous psychiatric notes.       FINAL CLINICAL IMPRESSION(S) / ED DIAGNOSES   Final diagnoses:  Schizophrenia, unspecified type (HCC)  Alcohol use disorder  Homelessness     Rx / DC Orders   ED Discharge Orders     None        Note:  This document was prepared using Dragon voice recognition software and may include unintentional dictation errors.   Maggi Hershkowitz, Josette SAILOR, DO 09/09/23 [phone]

## 2023-09-09 NOTE — ED Notes (Signed)
 Pt belongings:  Black Warden/ranger pants Black/white belt [employer] Cell phone [employer]

## 2023-09-09 NOTE — BH Assessment (Signed)
 Writer attempted to assess pt; however, pt was not cooperative, refusing to participate. Psych team to follow up.

## 2023-09-09 NOTE — ED Notes (Signed)
 Pt provided breakfast tray.

## 2023-09-09 NOTE — ED Notes (Addendum)
 Pt given lunch tray.

## 2023-09-09 NOTE — ED Notes (Signed)
 Psych team at bedside .

## 2023-09-09 NOTE — Consult Note (Signed)
 [facility] Psychiatric Consult Initial  Patient Name: .Kenneth Jensen  MRN: [id number]  DOB: May 18, 1990  Consult Order details:  Orders (From admission, onward)     Start     Ordered   09/09/23 0122  CONSULT TO CALL ACT TEAM       Ordering Provider: Neomi Josette SAILOR, DO  Provider:  (Not yet assigned)  Question:  Reason for Consult?  Answer:  Psych consult   09/09/23 0121   09/09/23 0122  IP CONSULT TO PSYCHIATRY       Ordering Provider: Neomi Josette SAILOR, DO  Provider:  (Not yet assigned)  Question:  Reason for consult:  Answer:  Medication management   09/09/23 0121             Mode of Visit: In person    Psychiatry Consult Evaluation  Service Date: September 09, 2023 LOS:  LOS: 0 days  Chief Complaint Paranoia   Primary Psychiatric Diagnoses  Hx of Schizophrenia, paranoid   Assessment  Kenneth Jensen is a 33 y.o. male admitted: Presented to the ED Patient with history of schizophrenia, bipolar disorder who presented to the emergency department requesting psychiatric evaluation.  Patient states it is time for me to get off the streets.  He states that he also needs help with alcohol use disorder and that he drank a margarita today.  Denies history of withdrawal seizures.  Per triage note, patient has been paranoid.  Patient denies SI, HI but does not answer me appropriately when asked about hallucinations and appears to be responding to internal stimuli. Psychiatry was consulted for safety evaluation.   On assessment, patient is seen laying in bed, appears sleepy, and answers some of the provider's questions, though does not provide much detail. He denies SI/HI/plan and denies hallucinations. He is not observed responding to internal stimuli. Patient is psychiatrically at baseline and cleared for discharge with recommendation to follow up with his outpatient ACT team.      Diagnoses:  Active Hospital problems: Active Problems:   * No active hospital problems.  *    Plan   ## Psychiatric Medication Recommendations:  Continue home medications   ## Medical Decision Making Capacity: Not specifically addressed in this encounter  ## Further Work-up:  No further medical work up needed   -- Pertinent labwork reviewed earlier this admission includes: CBC, CMP, acetaminophen , salicylate level, valproic acid , blood glucose, blood toxicology screen, urine toxicology screen   ## Disposition:-- There are no psychiatric contraindications to discharge at this time  ## Behavioral / Environmental: -Utilize compassion and acknowledge the patient's experiences while setting clear and realistic expectations for care.    ## Safety and Observation Level:  - Based on my clinical evaluation, I estimate the patient to be at low risk of self harm in the current setting. - At this time, we recommend  routine. This decision is based on my review of the chart including patient's history and current presentation, interview of the patient, mental status examination, and consideration of suicide risk including evaluating suicidal ideation, plan, intent, suicidal or self-harm behaviors, risk factors, and protective factors. This judgment is based on our ability to directly address suicide risk, implement suicide prevention strategies, and develop a safety plan while the patient is in the clinical setting. Please contact our team if there is a concern that risk level has changed.  CSSR Risk Category:C-SSRS RISK CATEGORY: No Risk  Suicide Risk Assessment: Patient has following modifiable risk factors for suicide: none identified  at this time, which we are addressing by outpatient ACT team to meet patient's needs in the community. Patient has following non-modifiable or demographic risk factors for suicide: male gender Patient has the following protective factors against suicide: Access to outpatient mental health care and no history of suicide attempts  Thank you for this  consult request. Recommendations have been communicated to the primary team.  We will sign off at this time.   Kenneth Aversa LITTIE Lukes, PA-C       History of Present Illness  Relevant Aspects of Hospital ED   Patient Report:  On assessment, patient is unable to state what brought him into the hospital today, but states he was transported here by friends.  He appears sleepy during interview and is not responsive to all questions, though does answer some questions with little detail.  Provider encouraged patient to participate in his care and decision making.  He denies SI/HI/plan and denies hallucinations.   He denies substance use. He is not aware of the medications he takes but states he has an ACT team - Kenneth Jensen. Patient reports living at home, says he lives part-time with his aunt.  Psych ROS:  Depression: Denies Anxiety: Denies Mania (lifetime and current): No grandiose delusions noted Psychosis: (lifetime and current): Not responding to internal stimuli  Collateral information:  Contacted *** at *** on ***    Psychiatric and Social History  Psychiatric History:  Information collected from the patient and chart review.  Prev Dx/Sx: Schizophrenia Current Psych Provider: [place] ACT team, [facility] Home Meds (current): Depakote , Haldol , Invega , Seroquel , trazodone , Minipress  Previous Med Trials: Invega  Sustenna Therapy: [facility]   Prior Psych Hospitalization: Many years ago Prior Self Harm: Denies Prior Violence: Denies  Family Psych History: Unknown Family Hx suicide: Unknown  Social History:   Educational Hx: Unknown Occupational Hx: On disability Legal Hx: None reported Living Situation: Has a legal guardian Spiritual Hx: None reported Access to weapons/lethal means: Denies  Substance History Alcohol: Denies Tobacco: Denies Illicit drugs: Cannabis intermittent use, has history of cocaine use Prescription drug abuse: Denies Rehab hx: Reported  Exam Findings  Physical Exam:  Reviewed and agree with the physical exam findings conducted by the ED provider Vital Signs:  Temp:  [98.3 F (36.8 C)-98.5 F (36.9 C)] 98.5 F (36.9 C) (09/03 0854) Pulse Rate:  [79-100] 79 (09/03 0854) Resp:  [16-17] 16 (09/03 0854) BP: (125-135)/(61-77) 135/77 (09/03 0854) SpO2:  [97 %-98 %] 97 % (09/03 0854) Blood pressure 135/77, pulse 79, temperature 98.5 F (36.9 C), temperature source Oral, resp. rate 16, SpO2 97%. There is no height or weight on file to calculate BMI.    Mental Status Exam: General Appearance: Casual  Orientation:  Full (Time, Place, and Person)  Memory:  Immediate;   Fair Recent;   Fair Remote;   Fair  Concentration:  Concentration: Poor and Attention Span: Poor  Recall:  Fair  Attention  Poor  Eye Contact:  Minimal  Speech:  Garbled and Slow  Language:  Fair  Volume:  Normal  Mood: sleepy  Affect:  Appropriate  Thought Process:  Coherent  Thought Content:  Logical  Suicidal Thoughts:  No  Homicidal Thoughts:  No  Judgement:  Impaired  Insight:  Shallow  Psychomotor Activity:  Normal  Akathisia:  No  Fund of Knowledge:  Poor      Assets:  Resilience  Cognition:  WNL  ADL's:  Intact  AIMS (if indicated):        Other History  These have been pulled in through the EMR, reviewed, and updated if appropriate.  Family History:  The patient's family history is not on file.  Medical History: Past Medical History:  Diagnosis Date   Asthma    Bipolar 1 disorder (HCC)    Depression    Schizophrenia (HCC)    Schizotaxia     Surgical History: History reviewed. No pertinent surgical history.   Medications:   Current Facility-Administered Medications:    benztropine  (COGENTIN ) tablet 0.5 mg, 0.5 mg, Oral, Daily, Ward, Kristen N, DO, 0.5 mg at 09/09/23 0846   divalproex  (DEPAKOTE  ER) 24 hr tablet 1,000 mg, 1,000 mg, Oral, QHS, Ward, Kristen N, DO   haloperidol  (HALDOL ) tablet 10 mg, 10 mg, Oral, TID, Ward, Kristen N, DO, 10 mg at  09/09/23 0846   paliperidone  (INVEGA ) 24 hr tablet 3 mg, 3 mg, Oral, Daily, Ward, Kristen N, DO, 3 mg at 09/09/23 0846   prazosin  (MINIPRESS ) capsule 1 mg, 1 mg, Oral, QHS, Ward, Kristen N, DO, 1 mg at 09/09/23 0224   QUEtiapine  (SEROQUEL ) tablet 200 mg, 200 mg, Oral, QHS, Ward, Kristen N, DO, 200 mg at 09/09/23 0224   traZODone  (DESYREL ) tablet 50 mg, 50 mg, Oral, QHS PRN, Ward, Kristen N, DO, 50 mg at 09/09/23 0224  Current Outpatient Medications:    benztropine  (COGENTIN ) 0.5 MG tablet, Take 0.5 mg by mouth daily. (Patient not taking: Reported on 07/14/2023), Disp: , Rfl:    divalproex  (DEPAKOTE  ER) 500 MG 24 hr tablet, Take 2 tablets (1,000 mg total) by mouth at bedtime for 14 days., Disp: 28 tablet, Rfl: 0   haloperidol  (HALDOL ) 10 MG tablet, Take 10 mg by mouth 3 (three) times daily. (Patient not taking: Reported on 07/14/2023), Disp: , Rfl:    nicotine  polacrilex (NICORETTE ) 2 MG gum, Take 1 each (2 mg total) by mouth as needed for smoking cessation. (Patient not taking: Reported on 07/29/2023), Disp: 100 tablet, Rfl: 0   paliperidone  (INVEGA  SUSTENNA) 234 MG/1.5ML injection, Inject 234 mg into the muscle every 28 (twenty-eight) days., Disp: 1.8 mL, Rfl: 1   paliperidone  (INVEGA ) 3 MG 24 hr tablet, Take 3 mg by mouth at bedtime., Disp: , Rfl:    prazosin  (MINIPRESS ) 1 MG capsule, Take 1 capsule (1 mg total) by mouth at bedtime., Disp: 14 capsule, Rfl: 0   QUEtiapine  (SEROQUEL ) 200 MG tablet, Take 200 mg by mouth at bedtime., Disp: , Rfl:    traZODone  (DESYREL ) 50 MG tablet, Take 1 tablet (50 mg total) by mouth at bedtime as needed for up to 14 days for sleep., Disp: 14 tablet, Rfl: 0  Allergies: Allergies  Allergen Reactions   Penicillins Anaphylaxis   Amoxicillin Swelling   Percocet [Oxycodone-Acetaminophen ]    Vicodin [Hydrocodone-Acetaminophen ] Itching   Vicodin [Hydrocodone-Acetaminophen ]     Camelia LITTIE Lukes, PA-C

## 2023-09-09 NOTE — Progress Notes (Signed)
 An attempt was made by the psychiatric team to evaluate a 33 year old male patient due to presenting concerns of paranoia. Upon approach, the patient expressed an unwillingness to engage in conversation, stating that it was too late and he was attempting to sleep. Despite attempts to establish rapport, the patient became increasingly irritable and raised his voice. He was uncooperative and declined to participate in further discussion. Due to the patient's refusal to engage and escalating irritability, a full psychiatric assessment could not be completed at this time. The patient will be reassessed when more cooperative.

## 2023-09-09 NOTE — ED Notes (Signed)
 Pt given his clothes for DC.

## 2023-09-09 NOTE — ED Notes (Signed)
 Vol/Psych Consult Pending

## 2023-09-09 NOTE — BH Assessment (Signed)
 Comprehensive Clinical Assessment (CCA) Screening, Triage and Referral Note  09/09/2023 Kenneth Jensen [id number]  Kenneth Jensen, 33 year old male who presents to [facility] ED voluntarily for treatment. Per triage note, Pt reports I need to go away to the hospital for a little while so I can get myself together pt reports not trusting people around him and being paranoid. Pt denies SI/HI   During TTS assessment pt presents alert and oriented x 4, restless but cooperative, and mood-congruent with affect. The pt does not appear to be responding to internal or external stimuli. Neither is the pt presenting with any delusional thinking. Pt verified the information provided to triage RN.   Pt would not state why he came to the ED. During interview, patient was asked several questions and responded accordingly. Patient reports he is compliant with his medications, patient says he lives alone but his aunt stays with him part-time, patient has an Teacher, adult education and receives other social support services. Patient denies SI/HI/AH/VH. Patient UDS was positive for cocaine and cannabinoid.    Per Dr. Jadpalle, pt does not meet criteria for inpatient psychiatric admission and can be discharged.    Chief Complaint:  Chief Complaint  Patient presents with   Psychiatric Evaluation   Visit Diagnosis: Hx of schizophrenia, poly substance use   Patient Reported Information How did you hear about us ? Self  What Is the Reason for Your Visit/Call Today? Patient would not state why he came to the ED.  How Long Has This Been Causing You Problems? <Week  What Do You Feel Would Help You the Most Today? Housing Assistance   Have You Recently Had Any Thoughts About Hurting Yourself? No  Are You Planning to Commit Suicide/Harm Yourself At This time? No   Have you Recently Had Thoughts About Hurting Someone [name]? No  Are You Planning to Harm Someone at This Time? No  Explanation: Patient denied  SI/HI.   Have You Used Any Alcohol or Drugs in the Past 24 Hours? Yes  How Long Ago Did You Use Drugs or Alcohol? today 07/28/23  What Did You Use and How Much? Cannabis; aount unknown   Do You Currently Have a Therapist/Psychiatrist? Yes  Name of Therapist/Psychiatrist: [facility]   Have You Been Recently Discharged From Any Office Practice or Programs? No  Explanation of Discharge From Practice/Program: n/a    CCA Screening Triage Referral Assessment Type of Contact: Face-to-Face  Telemedicine Service Delivery:   Is this Initial or Reassessment?   Date Telepsych consult ordered in [facility]:    Time Telepsych consult ordered in [facility]:    Location of Assessment: [facility] ED  Provider Location: [facility] ED    Collateral Involvement: none   Does Patient Have a Automotive engineer Guardian? No data recorded Name and Contact of Legal Guardian: No data recorded If Minor and Not Living with Parent(s), Who has Custody? n/a  Is CPS involved or ever been involved? Never  Is APS involved or ever been involved? Never   Patient Determined To Be At Risk for Harm To Self or Others Based on Review of Patient Reported Information or Presenting Complaint? No  Method: No Plan  Availability of Means: No access or NA  Intent: Vague intent or NA  Notification Required: No need or identified person  Additional Information for Danger to Others Potential: -- (n/a)  Additional Comments for Danger to Others Potential: n/a  Are There Guns or Other Weapons in Your Home? No  Types of Guns/Weapons:  No access  Are These Weapons Safely Secured?                            Yes  Who Could Verify You Are Able To Have These Secured: No access  Do You Have any Outstanding Charges, Pending Court Dates, Parole/Probation? None reported  Contacted To Inform of Risk of Harm To Self or Others: -- (n/a)   Does Patient Present under Involuntary Commitment? No    [place] of Residence:  [place]   Patient Currently Receiving the Following Services: Medication Management; ACTT (Assertive Community Treatment)   Determination of Need: Emergent (2 hours)   Options For Referral: ED Visit; Therapeutic Triage Services   Disposition Recommendation per psychiatric provider: Per Dr. Jadpalle, pt does not meet criteria for inpatient psychiatric admission and can be discharged.  Laiden Milles JONELLE Dolly, Counselor, LCAS-A

## 2023-09-09 NOTE — ED Notes (Signed)
 Shift report received, patient escorted to [facility] by security, patient re wanded in intake room by [employer]. Patient placed in BHU Room 2. Blanket provided to patient. Assumed care of patient.

## 2023-09-19 ENCOUNTER — Other Ambulatory Visit: Payer: Self-pay

## 2023-09-19 ENCOUNTER — Emergency Department
Admission: EM | Admit: 2023-09-19 | Discharge: 2023-09-20 | Disposition: A | Discharge status: Other Acute Inpatient Hospital | Payer: Medicare (Managed Care) | Attending: Emergency Medicine | Admitting: Emergency Medicine

## 2023-09-19 DIAGNOSIS — F1910 Other psychoactive substance abuse, uncomplicated: Secondary | ICD-10-CM

## 2023-09-19 DIAGNOSIS — F209 Schizophrenia, unspecified: Secondary | ICD-10-CM | POA: Diagnosis not present

## 2023-09-19 DIAGNOSIS — F29 Unspecified psychosis not due to a substance or known physiological condition: Secondary | ICD-10-CM | POA: Insufficient documentation

## 2023-09-19 DIAGNOSIS — Z79899 Other long term (current) drug therapy: Secondary | ICD-10-CM | POA: Insufficient documentation

## 2023-09-19 DIAGNOSIS — R456 Violent behavior: Secondary | ICD-10-CM | POA: Diagnosis present

## 2023-09-19 LAB — BASIC METABOLIC PANEL WITH GFR
Anion gap: 11 (ref 5–15)
BUN: 8 mg/dL (ref 6–20)
CO2: 25 mmol/L (ref 22–32)
Calcium: 8.7 mg/dL — ABNORMAL LOW (ref 8.9–10.3)
Chloride: 100 mmol/L (ref 98–111)
Creatinine, Ser: 0.67 mg/dL (ref 0.61–1.24)
GFR, Estimated: >60 mL/min (ref >60)
Glucose, Bld: 99 mg/dL (ref 70–99)
Potassium: 3.6 mmol/L (ref 3.5–5.1)
Sodium: 136 mmol/L (ref 135–145)

## 2023-09-19 LAB — CBC WITH DIFFERENTIAL/PLATELET
Abs Immature Granulocytes: 0.02 K/uL (ref 0.00–0.07)
Basophils Absolute: 0 K/uL (ref 0.0–0.1)
Basophils Relative: 0 %
Eosinophils Absolute: 0.3 K/uL (ref 0.0–0.5)
Eosinophils Relative: 3 %
HCT: 41.3 % (ref 39.0–52.0)
Hemoglobin: 13.6 g/dL (ref 13.0–17.0)
Immature Granulocytes: 0 %
Lymphocytes Relative: 42 %
Lymphs Abs: 4.2 K/uL — ABNORMAL HIGH (ref 0.7–4.0)
MCH: 28.7 pg (ref 26.0–34.0)
MCHC: 32.9 g/dL (ref 30.0–36.0)
MCV: 87.1 fL (ref 80.0–100.0)
Monocytes Absolute: 0.8 K/uL (ref 0.1–1.0)
Monocytes Relative: 8 %
Neutro Abs: 4.6 K/uL (ref 1.7–7.7)
Neutrophils Relative %: 47 %
Platelets: 364 K/uL (ref 150–400)
RBC: 4.74 MIL/uL (ref 4.22–5.81)
RDW: 13 % (ref 11.5–15.5)
WBC: 9.9 K/uL (ref 4.0–10.5)
nRBC: 0 % (ref 0.0–0.2)

## 2023-09-19 LAB — ETHANOL: Alcohol, Ethyl (B): <15 mg/dL (ref <15)

## 2023-09-19 LAB — URINE DRUG SCREEN, QUALITATIVE (ARMC ONLY)
Amphetamines, Ur Screen: NOT DETECTED
Barbiturates, Ur Screen: NOT DETECTED
Benzodiazepine, Ur Scrn: NOT DETECTED
Cannabinoid 50 Ng, Ur ~~LOC~~: POSITIVE — AB
Cocaine Metabolite,Ur ~~LOC~~: POSITIVE — AB
MDMA (Ecstasy)Ur Screen: NOT DETECTED
Methadone Scn, Ur: NOT DETECTED
Opiate, Ur Screen: NOT DETECTED
Phencyclidine (PCP) Ur S: NOT DETECTED
Tricyclic, Ur Screen: NOT DETECTED

## 2023-09-19 NOTE — ED Notes (Signed)
 Patient has been accepted to [facility].  Patient assigned to 400 unit Accepting physician is Dr. Oneil Charleston.  Call report to [phone].  Representative was Ava.   ER Staff is aware of it:  Nitchia ER Secretary  Dr. Claudene, ER MD  Delon Patient's Nurse     Patient's Family/Support System ([employer] services) have been updated as well.   TTS spoke with [name] the on call Guardian for [employer] [place] [phone]), who gave verbal consent for transportation to [facility] for inpatient placement.

## 2023-09-19 NOTE — ED Triage Notes (Signed)
 Patient brought in by self, pt states he's been taking his meds a little bit. Patient paranoid, flight of ideas, tangential, loose associations. Pt reports cocaine use, denies SI/HI.

## 2023-09-19 NOTE — ED Notes (Signed)
 Called number for facility nsg supervisor, no answer, left message asking for call back.

## 2023-09-19 NOTE — ED Notes (Signed)
 [facility] will not be able to pick up patient this evening. We are to call them tomorrow morning after report has been called to [facility] to arrange for transport. Will need updated EMTALA.

## 2023-09-19 NOTE — ED Notes (Signed)
 Patient given snack. No acute needs at this time.
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
## 2023-09-19 NOTE — ED Notes (Signed)
 Attempted to call report to [facility], no answer, message left.

## 2023-09-19 NOTE — ED Notes (Signed)
 Spoke with [place], facility nsg supervisor, no need to recall report before transport unless there is need to give an update.

## 2023-09-19 NOTE — ED Notes (Signed)
 Safe transport was called on day shift before report was called to [facility]. Day shift BHU nurse tried numerous times but was unable to give report. Safe transport arrived to pick the patient up approx 6:55 pm but due to no report given to the [facility] patient could not be transported. Safe transport waited over 1 1/2 waiting on taking patient. Duwaine (charge) was finally able to get a hold of someone to give report approx 8:30 PM to [facility] but safe transport had already left. Dawn spoke with [employer] and she is reaching out to another driver to see if they can take the patient tonight. Waiting on a call back from safe transport.

## 2023-09-19 NOTE — ED Notes (Signed)
 Attempted to contact pt's legal guardian w/ provided number on chart, no answer, VM left

## 2023-09-19 NOTE — ED Notes (Signed)
 This RN introduced self to pt. Pt denied any SI/HI at this time. Stated he had been hearing voices but they were not telling him anything, just bullshit. Pt stated he is ready to move but that nobody is  doing anything about it.

## 2023-09-19 NOTE — ED Notes (Signed)
 Attempted to call report to number [phone], Spoke with Ava the operator.

## 2023-09-19 NOTE — ED Notes (Signed)
 Pt transferred to [facility] room 2 by this RN and EDT Lashaunda. Pt belongings placed in BHU locker room and bin labeled with pt name.

## 2023-09-19 NOTE — ED Provider Notes (Signed)
 [facility] Provider Note    Event Date/Time   First MD Initiated Contact with Patient 09/19/23 [phone]     (approximate)   History   Psychiatric Evaluation   HPI  FORTUNE BRANNIGAN is a 33 y.o. male with a history of schizophrenia who presents requesting psychiatric evaluation.  The patient states that his primary concern is that he wants to be stabilized so that he can move from here to [place] where most of his family is.  He states that he has been actively using drugs and then gets into fights and has other problems after using them.  He denies SI or HI.  I reviewed the past medical records.  The patient was most recently evaluated by psychiatry on 9/3 after presenting to the emergency department requesting psychiatry evaluation.  He was cleared by psychiatry at that time.   Physical Exam   Triage Vital Signs: ED Triage Vitals  Encounter Vitals Group     BP 09/19/23 0538 114/81     Girls Systolic BP Percentile --      Girls Diastolic BP Percentile --      Boys Systolic BP Percentile --      Boys Diastolic BP Percentile --      Pulse Rate 09/19/23 0538 92     Resp 09/19/23 0538 16     Temp 09/19/23 0538 98.2 F (36.8 C)     Temp src --      SpO2 09/19/23 0538 96 %     Weight --      Height 09/19/23 0539 5' 6 (1.676 m)     Head Circumference --      Peak Flow --      Pain Score 09/19/23 0539 0     Pain Loc --      Pain Education --      Exclude from Growth Chart --     Most recent vital signs: Vitals:   09/19/23 0538  BP: 114/81  Pulse: 92  Resp: 16  Temp: 98.2 F (36.8 C)  SpO2: 96%     General: Awake, no distress.  CV:  Good peripheral perfusion.  Resp:  Normal effort.  Abd:  No distention.  Other:  Flat affect.  Calm and cooperative.   ED Results / Procedures / Treatments   Labs (all labs ordered are listed, but only abnormal results are displayed) Labs Reviewed  BASIC METABOLIC PANEL WITH GFR - Abnormal; Notable  for the following components:      Result Value   Calcium 8.7 (*)    All other components within normal limits  CBC WITH DIFFERENTIAL/PLATELET - Abnormal; Notable for the following components:   Lymphs Abs 4.2 (*)    All other components within normal limits  URINE DRUG SCREEN, QUALITATIVE ([facility] ONLY) - Abnormal; Notable for the following components:   Cocaine Metabolite,Ur [place] POSITIVE (*)    Cannabinoid 50 Ng, Ur [place] POSITIVE (*)    All other components within normal limits  ETHANOL     EKG   RADIOLOGY   PROCEDURES:  Critical Care performed: No  Procedures   MEDICATIONS ORDERED IN ED: Medications - No data to display   IMPRESSION / MDM / ASSESSMENT AND PLAN / ED COURSE  I reviewed the triage vital signs and the nursing notes.  33 year old male with PMH as noted above presents requesting psychiatric evaluation related to his schizophrenia and drug use.  He reports being compliant with his medications.  He  has no SI or HI.  Differential diagnosis includes, but is not limited to, schizophrenia, polysubstance abuse, substance-induced mood disorder.  Patient's presentation is most consistent with exacerbation of chronic illness.  We will obtain lab workup for medical clearance, psychiatry and TTS consults, and reassess.  The patient has been placed in psychiatric observation due to the need to provide a safe environment for the patient while obtaining psychiatric consultation and evaluation, as well as ongoing medical and medication management to treat the patient's condition.  The patient has not been placed under full IVC at this time.   ----------------------------------------- 3:21 PM on 09/19/2023 -----------------------------------------  Per psychiatry consult, the patient is recommended for inpatient admission.  BMP and CBC show no acute findings.  UDS is positive for cocaine and cannabinoids.   FINAL CLINICAL IMPRESSION(S) / ED DIAGNOSES   Final diagnoses:   Schizophrenia, unspecified type (HCC)     Rx / DC Orders   ED Discharge Orders     None        Note:  This document was prepared using Dragon voice recognition software and may include unintentional dictation errors.    Jacolyn Pae, MD 09/19/23 [phone]

## 2023-09-19 NOTE — ED Notes (Signed)
  Referral information for Psychiatric Hospitalization faxed to;   [facility] [phone]- [phone])  [facility] [phone]),   [facility] [phone])   [facility] [phone]--[phone])  [facility] [phone] -[phone]) [phone]  [place] [phone]),  [place] [phone], [phone], [phone] or [phone]),   [place] [phone]--- [phone]--- [phone]--- [phone])  [address] [phone]),   [place] [phone] -or- [phone]),   [facility] [phone] phone-- [phone])  [facility] [phone]),  [address] [phone])  [place] [phone] -or- [phone])  [place] [phone] or [phone]),   [facility] [phone]).  [facility] [phone])

## 2023-09-19 NOTE — ED Notes (Signed)
 Dinner tray provided for pt at bedside at this time. Pt aroused, but continues to sleep.

## 2023-09-19 NOTE — ED Notes (Signed)
 This RN attempted to call report, spoke with [facility] in intake who transferred this RN to [address], no answer.

## 2023-09-19 NOTE — ED Notes (Signed)
 Attempted to call report to [facility], intake office transferred this RN to unit 400, no answer, message left.

## 2023-09-19 NOTE — ED Notes (Signed)
 This RN notified by Primary RN safe transport apparently left. Pt taken back to unit by Primary RN. EDP notified.

## 2023-09-19 NOTE — ED Notes (Signed)
 Attempted to call report to number provided, transferred to unit by intake person and there was no answer.  Left message and number for them to call back for report.

## 2023-09-19 NOTE — ED Notes (Signed)
 EMTALA reviewed by this RN.

## 2023-09-19 NOTE — Consult Note (Signed)
 [facility] Psychiatric Consult Initial  Patient Name: .Kenneth Jensen  MRN: [id number]  DOB: [birth year]  Consult Order details:  Orders (From admission, onward)     Start     Ordered   09/19/23 0712  CONSULT TO CALL ACT TEAM       Ordering Provider: Jacolyn Pae, MD  Provider:  (Not yet assigned)  Question:  Reason for Consult?  Answer:  Psych consult   09/19/23 0712   09/19/23 0712  IP CONSULT TO PSYCHIATRY       Ordering Provider: Jacolyn Pae, MD  Provider:  (Not yet assigned)  Question Answer Comment  Reason for consult: Other (see comments)   Comments: Schizophrenia      09/19/23 0712             Mode of Visit: In person    Psychiatry Consult Evaluation  Service Date: September 19, 2023 LOS:  LOS: 0 days  Chief Complaint Substance Abuse  Primary Psychiatric Diagnoses  Polysubstance Abuse Schizophrenia   Assessment  Kenneth Jensen is a 33 y.o. male: Presented to the ED   EDP Note not available at this time.  Kenneth Jensen is a 33 year old black male who presented to the ED this morning for complaints of substance abuse and paranoid ideation. He is very well known to this ED and has documented visits almost monthly with various concerns regarding substance abuse, hallucinations, and medication concerns. He has a legal guardian Kenneth Jensen and has ACT Team services with [facility]. He states that he was last seen on Friday but does not feel as though his medications are working for him. Endorses feeling dizzy and overly sedated I sleep until 3-4 the next day.   Patient reports he lives alone at [place] apartment complex and people have been coming into his home for the past couple of months taking his belongings. Also believes that people in his family are stealing his blood. His aunt is the only person that can protect him. He adamantly denies hallucinations, suicidal or homicidal thoughts. Explains that he has been depressed for the  past 2-3 months due to the bump on my head.   Although he denies any other substance use outside of THC, his UDS is positive for cocaine and according to previous results, is a frequent user. He requests inpatient psychiatric admission to stop using substances. According to nursing notes, he admitted to using cocaine, currently would not provide time frame of use.   Endorses hallucinations, denies them being command in nature and are at his baseline of just talking. He denies SI, HI, AVH at this time.   Diagnoses:  Active Hospital problems: Active Problems:   * No active hospital problems. *    Plan   ## Psychiatric Medication Recommendations:  Continue medications  ## Medical Decision Making Capacity: Patient has a legal guardian   ## Further Work-up:   -- most recent EKG on 09/04/23 had QtC of 470 -- Pertinent labwork reviewed earlier this admission includes: CBC, CMP, UDS   ## Disposition:-- Recommend inpatient hospitalization once medically cleared. Patient is voluntary  ## Behavioral / Environmental: -Utilize compassion and acknowledge the patient's experiences while setting clear and realistic expectations for care.    ## Safety and Observation Level:  - Based on my clinical evaluation, I estimate the patient to be at LOW risk of self harm in the current setting. - At this time, we recommend  routine. This decision is based on my  review of the chart including patient's history and current presentation, interview of the patient, mental status examination, and consideration of suicide risk including evaluating suicidal ideation, plan, intent, suicidal or self-harm behaviors, risk factors, and protective factors. This judgment is based on our ability to directly address suicide risk, implement suicide prevention strategies, and develop a safety plan while the patient is in the clinical setting. Please contact our team if there is a concern that risk level has changed.  CSSR  Risk Category:C-SSRS RISK CATEGORY: No Risk  Suicide Risk Assessment: Patient has following modifiable risk factors for suicide: medication noncompliance, which we are addressing by involving ACT Team in care, encouraging compliance with medications. Patient has following non-modifiable or demographic risk factors for suicide: male gender and psychiatric hospitalization Patient has the following protective factors against suicide: Access to outpatient mental health care  Thank you for this consult request. Recommendations have been communicated to the primary team.  We recommend inpatient treatment at this time. He is not a candidate for the milieu at [facility] at this time.   Kenneth Jensen B Kenneth Mcglory, NP       History of Present Illness  Relevant Aspects of Hospital ED   Patient Report:  Kenneth Jensen is a 32 year old black male who presented to the ED this morning for complaints of substance abuse and paranoid ideation. He is very well known to this ED and has documented visits almost monthly with various concerns regarding substance abuse, hallucinations, and medication concerns. He has a legal guardian Kenneth Jensen and has ACT Team services with [facility]. He states that he was last seen on Friday but does not feel as though his medications are working for him. Endorses feeling dizzy and overly sedated I sleep until 3-4 the next day.   Patient reports he lives alone at a near by apartment complex and people have been coming into his home for the past couple of months taking his belongings. Also believes that people in his family are stealing his blood. His aunt is the only person that can protect him. He adamantly denies hallucinations, suicidal or homicidal thoughts. Explains that he has been depressed for the past 2-3 months due to the bump on my head.   Although he denies any other substance use outside of THC, his UDS is positive for cocaine and according to previous results, is a frequent user.  He requests inpatient psychiatric admission to stop using substances. According to nursing notes, he admitted to using cocaine, currently would not provide time frame of use.   Endorses hallucinations, denies them being command in nature and are at his baseline of just talking. He denies SI, HI, AVH at this time.   Psych ROS:  Depression: endorses Anxiety:  denies Mania (lifetime and current): denies Psychosis: (lifetime and current): endorses  Collateral information:  From TTS report:  TTS contacted on call services for [facility] [employer] - [phone]. TTS spoke with Kenneth Jensen.  She reports that Kenneth Jensen has spoken to Kenneth Jensen several times during the week and was in touch with [facility] due to Kenneth Jensen behaviors. 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  Further discussion was about his medication, as Kenneth Jensen requested to come off of his medications.  On September 11th Kenneth Jensen spoke with the guardian about substance abuse.  This information was recounted by Kenneth Jensen  from the documentation by Mr. Kenneth Jensen.   Psychiatric and Social History  Psychiatric History:  Information collected from patient and chart  Prev Dx/Sx: schizophrenia Current Psych Provider: [facility] Home Meds (current): Haloperidol , quetiapine , invgea oral, invega  sustenna, trazodone , benzotropine Previous Med Trials: unknown Therapy: denies  Prior Psych Hospitalization: [facility]  Prior Self Harm: denies Prior Violence: denies  Family Psych History: denies Family Hx suicide: denies  Social History:   Educational Hx: HS Occupational Hx: disabled Armed forces operational officer Hx: denies Living Situation: lives alone Spiritual Hx: unknown Access to weapons/lethal means: denies   Substance History Alcohol: denies  Type of alcohol denies  Last Drink denies  Number of drinks per day denies  History of  alcohol withdrawal seizures denies  History of DT's denies  Tobacco: denies  Illicit drugs: cocaine Prescription drug abuse: denies Rehab hx: denies  Exam Findings  Physical Exam: I have reviewed and agree with initial provider exam Vital Signs:  Temp:  [98.2 F (36.8 C)] 98.2 F (36.8 C) (09/13 0538) Pulse Rate:  [92] 92 (09/13 0538) Resp:  [16] 16 (09/13 0538) BP: (114)/(81) 114/81 (09/13 0538) SpO2:  [96 %] 96 % (09/13 0538) Blood pressure 114/81, pulse 92, temperature 98.2 F (36.8 C), resp. rate 16, height 5' 6 (1.676 m), SpO2 96%. Body mass index is 32.02 kg/m.    Mental Status Exam: General Appearance: Fairly Groomed  Orientation:  Full (Time, Place, and Person)  Memory:  Immediate;   Good Recent;   Good Remote;   Good  Concentration:  Concentration: Good and Attention Span: Good  Recall:  Good  Attention  Good  Eye Contact:  Good  Speech:  Normal Rate  Language:  Good  Volume:  Normal  Mood: labile  Affect:  Congruent  Thought Process:  Disorganized  Thought Content:  Paranoid Ideation  Suicidal Thoughts:  No  Homicidal Thoughts:  No  Judgement:  Fair  Insight:  Fair  Psychomotor Activity:  Normal  Akathisia:  No  Fund of Knowledge:  Good      Assets:  Desire for Improvement  Cognition:  WNL  ADL's:  Intact  AIMS (if indicated):        Other History   These have been pulled in through the EMR, reviewed, and updated if appropriate.  Family History:  The patient's family history is not on file.  Medical History: Past Medical History:  Diagnosis Date   Asthma    Bipolar 1 disorder (HCC)    Depression    Schizophrenia (HCC)    Schizotaxia     Surgical History: History reviewed. No pertinent surgical history.   Medications:  No current facility-administered medications for this encounter.  Current Outpatient Medications:    benztropine  (COGENTIN ) 0.5 MG tablet, Take 0.5 mg by mouth daily. (Patient not taking: Reported on 07/14/2023),  Disp: , Rfl:    divalproex  (DEPAKOTE  ER) 500 MG 24 hr tablet, Take 2 tablets (1,000 mg total) by mouth at bedtime for 14 days., Disp: 28 tablet, Rfl: 0   haloperidol  (HALDOL ) 10 MG tablet, Take 10 mg by mouth 3 (three) times daily. (Patient not taking: Reported on 07/14/2023), Disp: , Rfl:    nicotine  polacrilex (NICORETTE ) 2 MG gum, Take 1 each (2 mg total) by mouth as needed for smoking cessation. (Patient not taking: Reported on 07/29/2023), Disp: 100 tablet, Rfl: 0   paliperidone  (INVEGA  SUSTENNA) 234 MG/1.5ML injection, Inject 234 mg into the muscle every 28 (twenty-eight) days., Disp: 1.8 mL, Rfl: 1   paliperidone  (INVEGA )  3 MG 24 hr tablet, Take 3 mg by mouth at bedtime., Disp: , Rfl:    prazosin  (MINIPRESS ) 1 MG capsule, Take 1 capsule (1 mg total) by mouth at bedtime., Disp: 14 capsule, Rfl: 0   QUEtiapine  (SEROQUEL ) 200 MG tablet, Take 200 mg by mouth at bedtime., Disp: , Rfl:    traZODone  (DESYREL ) 50 MG tablet, Take 1 tablet (50 mg total) by mouth at bedtime as needed for up to 14 days for sleep., Disp: 14 tablet, Rfl: 0  Allergies: Allergies  Allergen Reactions   Penicillins Anaphylaxis   Amoxicillin Swelling   Percocet [Oxycodone-Acetaminophen ]    Vicodin [Hydrocodone-Acetaminophen ] Itching   Vicodin [Hydrocodone-Acetaminophen ]     Juanice Warburton B Emillia Weatherly, NP

## 2023-09-19 NOTE — ED Notes (Signed)
 Attempted to call report to number provided, [phone] transferred by intake to unit that did not answer the phone.  Then attempted to call nsg supervisor of facility at number provided [phone] and again no answer but did leave a voice mail asking for a call back so report could be given.

## 2023-09-19 NOTE — ED Notes (Signed)
 Pt taken to interview room with psych team.

## 2023-09-19 NOTE — ED Notes (Signed)
 Assumed care of patient.

## 2023-09-20 NOTE — ED Notes (Signed)
Safe transport called 

## 2023-09-20 NOTE — ED Notes (Signed)
EMTALA reviewed. 

## 2023-09-28 ENCOUNTER — Other Ambulatory Visit: Payer: Self-pay

## 2023-09-28 ENCOUNTER — Emergency Department
Admission: EM | Admit: 2023-09-28 | Discharge: 2023-09-29 | Disposition: A | Discharge status: Home / Self Care | Payer: Medicare (Managed Care) | Attending: Emergency Medicine | Admitting: Emergency Medicine

## 2023-09-28 DIAGNOSIS — D72829 Elevated white blood cell count, unspecified: Secondary | ICD-10-CM | POA: Insufficient documentation

## 2023-09-28 DIAGNOSIS — F419 Anxiety disorder, unspecified: Secondary | ICD-10-CM | POA: Diagnosis not present

## 2023-09-28 DIAGNOSIS — Z79899 Other long term (current) drug therapy: Secondary | ICD-10-CM | POA: Diagnosis not present

## 2023-09-28 DIAGNOSIS — Z91148 Patient's other noncompliance with medication regimen for other reason: Secondary | ICD-10-CM | POA: Insufficient documentation

## 2023-09-28 DIAGNOSIS — R443 Hallucinations, unspecified: Secondary | ICD-10-CM

## 2023-09-28 DIAGNOSIS — F209 Schizophrenia, unspecified: Secondary | ICD-10-CM | POA: Diagnosis not present

## 2023-09-28 DIAGNOSIS — R45851 Suicidal ideations: Secondary | ICD-10-CM | POA: Diagnosis not present

## 2023-09-28 DIAGNOSIS — F319 Bipolar disorder, unspecified: Secondary | ICD-10-CM | POA: Diagnosis present

## 2023-09-28 DIAGNOSIS — J45909 Unspecified asthma, uncomplicated: Secondary | ICD-10-CM | POA: Diagnosis not present

## 2023-09-28 LAB — CBC
HCT: 39.2 % (ref 39.0–52.0)
Hemoglobin: 13 g/dL (ref 13.0–17.0)
MCH: 29.1 pg (ref 26.0–34.0)
MCHC: 33.2 g/dL (ref 30.0–36.0)
MCV: 87.7 fL (ref 80.0–100.0)
Platelets: 306 K/uL (ref 150–400)
RBC: 4.47 MIL/uL (ref 4.22–5.81)
RDW: 13 % (ref 11.5–15.5)
WBC: 11.9 K/uL — ABNORMAL HIGH (ref 4.0–10.5)
nRBC: 0 % (ref 0.0–0.2)

## 2023-09-28 NOTE — ED Provider Notes (Signed)
 [facility] Provider Note    Event Date/Time   First MD Initiated Contact with Patient 09/28/23 2337     (approximate)   History   Chief Complaint Psychiatric Evaluation   HPI  Kenneth Jensen is a 33 y.o. male with past medical history of schizophrenia, bipolar disorder, and polysubstance abuse who presents to the ED requesting psychiatric evaluation.  Patient reports that he has been dealing with hallucinations recently with a voice telling him that they are going to kill him if he does not go to the emergency room.  He denies any suicidal or homicidal ideation, states he has not been taking his medications but denies any drug use.  He currently denies any medical complaints.     Physical Exam   Triage Vital Signs: ED Triage Vitals  Encounter Vitals Group     BP 09/28/23 2319 (!) 139/91     Girls Systolic BP Percentile --      Girls Diastolic BP Percentile --      Boys Systolic BP Percentile --      Boys Diastolic BP Percentile --      Pulse Rate 09/28/23 2319 92     Resp 09/28/23 2319 17     Temp 09/28/23 2319 98.3 F (36.8 C)     Temp src --      SpO2 09/28/23 2319 96 %     Weight 09/28/23 2320 220 lb (99.8 kg)     Height 09/28/23 2320 5' (1.524 m)     Head Circumference --      Peak Flow --      Pain Score 09/28/23 2320 0     Pain Loc --      Pain Education --      Exclude from Growth Chart --     Most recent vital signs: Vitals:   09/28/23 2319  BP: (!) 139/91  Pulse: 92  Resp: 17  Temp: 98.3 F (36.8 C)  SpO2: 96%    Constitutional: Alert and oriented. Eyes: Conjunctivae are normal. Head: Atraumatic. Nose: No congestion/rhinnorhea. Mouth/Throat: Mucous membranes are moist.  Cardiovascular: Normal rate, regular rhythm. Grossly normal heart sounds.  2+ radial pulses bilaterally. Respiratory: Normal respiratory effort.  No retractions. Lungs CTAB. Gastrointestinal: Soft and nontender. No distention. Musculoskeletal:  No lower extremity tenderness nor edema.  Neurologic:  Normal speech and language. No gross focal neurologic deficits are appreciated.    ED Results / Procedures / Treatments   Labs (all labs ordered are listed, but only abnormal results are displayed) Labs Reviewed  COMPREHENSIVE METABOLIC PANEL WITH GFR - Abnormal; Notable for the following components:      Result Value   Glucose, Bld 134 (*)    Calcium 8.4 (*)    All other components within normal limits  CBC - Abnormal; Notable for the following components:   WBC 11.9 (*)    All other components within normal limits  ETHANOL  URINE DRUG SCREEN, QUALITATIVE ([facility] ONLY)    PROCEDURES:  Critical Care performed: No  Procedures   MEDICATIONS ORDERED IN ED: Medications - No data to display   IMPRESSION / MDM / ASSESSMENT AND PLAN / ED COURSE  I reviewed the triage vital signs and the nursing notes.                              33 y.o. male with past medical history of schizophrenia, bipolar disorder, and  polysubstance abuse who presents to the ED for psychiatric evaluation due to auditory hallucinations, denies SI or HI.  Patient's presentation is most consistent with acute presentation with potential threat to life or bodily function.  Differential diagnosis includes, but is not limited to, psychosis, depression, anxiety, medication noncompliance, substance abuse, anemia, electrolyte abnormality.  Patient nontoxic-appearing and in no acute distress, vital signs are unremarkable.  Screening labs without significant anemia, leukocytosis, electrolyte abnormality, or AKI.  LFTs are unremarkable and patient may be medically cleared for psychiatric disposition.  Will maintain voluntary status as he reports hallucinations but has no SI or HI.  Psych eval pending at this time.  The patient has been placed in psychiatric observation due to the need to provide a safe environment for the patient while obtaining psychiatric  consultation and evaluation, as well as ongoing medical and medication management to treat the patient's condition.  The patient has not been placed under full IVC at this time.      FINAL CLINICAL IMPRESSION(S) / ED DIAGNOSES   Final diagnoses:  Hallucinations     Rx / DC Orders   ED Discharge Orders     None        Note:  This document was prepared using Dragon voice recognition software and may include unintentional dictation errors.   [name], MD 09/29/23 [phone]

## 2023-09-28 NOTE — ED Triage Notes (Signed)
 Pt reports he is hearing voices, pt denies SI. Reports he hasn't ate and wants to lay down

## 2023-09-28 NOTE — ED Notes (Signed)
 Pt belongings:  Phone Red jacket Blue Oncologist tshirt Red socks Black keychain

## 2023-09-29 DIAGNOSIS — F319 Bipolar disorder, unspecified: Secondary | ICD-10-CM | POA: Diagnosis not present

## 2023-09-29 LAB — COMPREHENSIVE METABOLIC PANEL WITH GFR
ALT: 13 U/L (ref 0–44)
AST: 24 U/L (ref 15–41)
Albumin: 3.8 g/dL (ref 3.5–5.0)
Alkaline Phosphatase: 66 U/L (ref 38–126)
Anion gap: 11 (ref 5–15)
BUN: 8 mg/dL (ref 6–20)
CO2: 25 mmol/L (ref 22–32)
Calcium: 8.4 mg/dL — ABNORMAL LOW (ref 8.9–10.3)
Chloride: 101 mmol/L (ref 98–111)
Creatinine, Ser: 0.91 mg/dL (ref 0.61–1.24)
GFR, Estimated: >60 mL/min (ref >60)
Glucose, Bld: 134 mg/dL — ABNORMAL HIGH (ref 70–99)
Potassium: 3.5 mmol/L (ref 3.5–5.1)
Sodium: 137 mmol/L (ref 135–145)
Total Bilirubin: 0.5 mg/dL (ref 0.0–1.2)
Total Protein: 7.2 g/dL (ref 6.5–8.1)

## 2023-09-29 LAB — ETHANOL: Alcohol, Ethyl (B): <15 mg/dL (ref <15)

## 2023-09-29 LAB — VALPROIC ACID LEVEL: Valproic Acid Lvl: 43 ug/mL — ABNORMAL LOW (ref 50–100)

## 2023-09-29 MED ORDER — OLANZAPINE 5 MG PO TBDP: 5.0000 mg | ORAL_TABLET | Freq: Every day | ORAL | Status: DC

## 2023-09-29 MED ORDER — OLANZAPINE 5 MG PO TBDP
5.0000 mg | ORAL_TABLET | Freq: Once | ORAL | Status: AC
  Administered 2023-09-29: 5 mg via ORAL
  Filled 2023-09-29: qty 1

## 2023-09-29 MED ORDER — OLANZAPINE 5 MG PO TBDP: 10.0000 mg | ORAL_TABLET | Freq: Every day | ORAL | Status: DC

## 2023-09-29 NOTE — BH Assessment (Signed)
 Psych Team met with patient for reassessment. Patient reports he is feeling good and is ready to leave ED. Patient states he has upcoming appointment at [facility] to get his medications. Patient receives social support services and disability check. Patient has his own apartment and states his aunt comes by frequently to check on him. Patient denies current SI/HI/AH/VH.  Per Kenneth Jensen. NP, patient does not meet criteria for inpatient psychiatric admission and can be discharged.

## 2023-09-29 NOTE — Progress Notes (Signed)
 Full consult note to follow: Patient 32 year old male, with multiple previous visits to emergency room for similar presentation. Patient was recommended by Jon, NP for reassessment today. Today, Patient reports he is feeling good and is ready to leave ED. Patient states he has upcoming appointment at [facility] to get his medications. Patient receives social support services and disability check. Patient has his own apartment and states his aunt comes by frequently to check on him. Patient denies current SI/HI/AH/VH. On current presentation there was no evidence of psychosis or mania and patient did not appear to be responding to internal stimuli. At this time, patient does not appear to be a risk to self or others. TTS left voice mail for legal guardian for return call. Patient does not meet criteria for Involuntary commitment or inpatient admission at this time.

## 2023-09-29 NOTE — Consult Note (Signed)
 [facility] Psychiatric Consult Initial  Patient Name: .Kenneth Jensen  MRN: [id number]  DOB: 09-14-1990  Consult Order details:  Orders (From admission, onward)     Start     Ordered   09/28/23 2354  IP CONSULT TO PSYCHIATRY       Ordering Provider: [name], MD  Provider:  (Not yet assigned)  Question:  Reason for consult:  Answer:  Medication management   09/28/23 2353   09/28/23 2354  CONSULT TO CALL ACT TEAM       Ordering Provider: [name], MD  Provider:  (Not yet assigned)  Question:  Reason for Consult?  Answer:  Hallucinations   09/28/23 2353             Mode of Visit: Tele-visit Virtual Statement:TELE PSYCHIATRY ATTESTATION & CONSENT As the provider for this telehealth consult, I attest that I verified the patient's identity using two separate identifiers, introduced myself to the patient, provided my credentials, disclosed my location, and performed this encounter via a HIPAA-compliant, real-time, face-to-face, two-way, interactive audio and video platform and with the full consent and agreement of the patient (or guardian as applicable.) Patient physical location: [facility] : [facility]. Telehealth provider physical location: home office in state of [place] .   Video start time:   Video end time:      Psychiatry Consult Evaluation  Service Date: September 29, 2023 LOS:  LOS: 0 days  Chief Complaint Auditory Hallucinations  Primary Psychiatric Diagnoses  Schizophrenia 2.  Bipolar disorde  Assessment  Kenneth Jensen is a 33 y.o. male admitted: Presented to the EDfor 09/28/2023 11:33 PM for auditory hallucinations, medication noncompliance, and passive suicidal ideation. He carries the psychiatric diagnoses of schizophrenia, bipolar disorder, and polysubstance abuse.  His current presentation of AH, paranoia, disorganized thought process, and poor insight is most consistent with acute psychosis due to medication noncompliance,  possibly exacerbated by substance use (pending UDS).  Current outpatient psychotropic medications are unknown due to poor recall, but patient reports previously being prescribed antipsychotic and mood-stabilizing agents. He historically had a partial to positive response to medications. He was non-compliant with medications prior to admission, evidenced by return of psychotic symptoms and voice command hallucinations.  On initial examination, patient is extremely drowsy, and is not fully cooperative or reliable due to sedation.  Diagnoses:  Active Hospital problems: Active Problems:   * No active hospital problems. *    Plan   ## Psychiatric Medication Recommendations:  Zyprexa  10 mg at bedtime   ## Medical Decision Making Capacity: Not specifically addressed in this encounter   ## Disposition:-- overnight observation; reassess  ## Behavioral / Environmental: - No specific recommendations at this time.     ## Safety and Observation Level:  - Based on my clinical evaluation, I estimate the patient to be at low risk of self harm in the current setting. - At this time, we recommend  routine. This decision is based on my review of the chart including patient's history and current presentation, interview of the patient, mental status examination, and consideration of suicide risk including evaluating suicidal ideation, plan, intent, suicidal or self-harm behaviors, risk factors, and protective factors. This judgment is based on our ability to directly address suicide risk, implement suicide prevention strategies, and develop a safety plan while the patient is in the clinical setting. Please contact our team if there is a concern that risk level has changed.  CSSR Risk Category:C-SSRS RISK CATEGORY: No Risk  Suicide Risk  Assessment: Patient has following modifiable risk factors for suicide: triggering events, which we are addressing by overnight observation. Patient has following  non-modifiable or demographic risk factors for suicide: male gender Patient has the following protective factors against suicide: Access to outpatient mental health care  Thank you for this consult request. Recommendations have been communicated to the primary team.  We will not recommend inpatient admission at this time.   Gerri Acre, NP       History of Present Illness  Relevant Aspects of Hospital ED Course:  Admitted on 09/28/2023 for auditory hallucinations, medication noncompliance, and passive suicidal ideation.  Patient Report:  Kenneth Jensen is a 33 year old male with a past psychiatric history of schizophrenia, bipolar disorder, and polysubstance abuse, who presents to the ED voluntarily requesting psychiatric evaluation. The patient reports hearing a voice telling him that he would be killed if he did not come to the hospital. He denies current suicidal ideation, homicidal ideation, or visual hallucinations but does endorse passive thoughts about death.  He denies recent drug use; however, urine drug screen is pending to verify. The patient admits to being off his medications but does not recall specific names. He denies current medical complaints. He states he lost his phone and became scared, which contributed to his decision to seek help. The patient reports wanting to return to inpatient psychiatric care.  Psych ROS:  Depression: no Anxiety:  yes Mania (lifetime and current): no Psychosis: (lifetime and current): yes   Review of Systems  Constitutional: Negative.   HENT: Negative.    Eyes: Negative.   Respiratory: Negative.    Cardiovascular: Negative.   Gastrointestinal: Negative.   Genitourinary: Negative.   Musculoskeletal: Negative.   Skin: Negative.   Neurological: Negative.   Psychiatric/Behavioral:  Positive for hallucinations.      Psychiatric and Social History  Psychiatric History:  Information collected from Patient history  Prev Dx/Sx:  Schizophrenia Current Psych Provider: unknown Home Meds (current): unknown Previous Med Trials: unknown Therapy: unknown  Prior Psych Hospitalization: yes  Prior Self Harm: unknown Prior Violence: unknown  Family Psych History: unknown Family Hx suicide: unknown  Social History:  Developmental Hx: unknown Educational Hx: unknown Occupational Hx: unknown Legal Hx: unknown Living Situation: alone Spiritual Hx: unknown Access to weapons/lethal means: no   Substance History Alcohol: denies  Tobacco: denies Illicit drugs: denies Prescription drug abuse: denies Rehab hx: unknown  Exam Findings   Vital Signs:  Temp:  [98.3 F (36.8 C)] 98.3 F (36.8 C) (09/22 2319) Pulse Rate:  [92] 92 (09/22 2319) Resp:  [17] 17 (09/22 2319) BP: (139)/(91) 139/91 (09/22 2319) SpO2:  [96 %] 96 % (09/22 2319) Weight:  [99.8 kg] 99.8 kg (09/22 2320) Blood pressure (!) 139/91, pulse 92, temperature 98.3 F (36.8 C), resp. rate 17, height 5' (1.524 m), weight 99.8 kg, SpO2 96%. Body mass index is 42.97 kg/m.  Physical Exam HENT:     Head: Normocephalic.     Nose: Nose normal.  Pulmonary:     Effort: Pulmonary effort is normal.  Musculoskeletal:        General: Normal range of motion.  Skin:    General: Skin is dry.      Other History   These have been pulled in through the EMR, reviewed, and updated if appropriate.  Family History:  The patient's family history is not on file.  Medical History: Past Medical History:  Diagnosis Date   Asthma    Bipolar 1 disorder (HCC)  Depression    Schizophrenia (HCC)    Schizotaxia     Surgical History: History reviewed. No pertinent surgical history.   Medications:  No current facility-administered medications for this encounter.  Current Outpatient Medications:    benztropine  (COGENTIN ) 0.5 MG tablet, Take 0.5 mg by mouth daily. (Patient not taking: Reported on 07/14/2023), Disp: , Rfl:    divalproex  (DEPAKOTE  ER) 500 MG 24  hr tablet, Take 2 tablets (1,000 mg total) by mouth at bedtime for 14 days., Disp: 28 tablet, Rfl: 0   haloperidol  (HALDOL ) 10 MG tablet, Take 10 mg by mouth 3 (three) times daily. (Patient not taking: Reported on 07/14/2023), Disp: , Rfl:    nicotine  polacrilex (NICORETTE ) 2 MG gum, Take 1 each (2 mg total) by mouth as needed for smoking cessation. (Patient not taking: Reported on 07/29/2023), Disp: 100 tablet, Rfl: 0   paliperidone  (INVEGA  SUSTENNA) 234 MG/1.5ML injection, Inject 234 mg into the muscle every 28 (twenty-eight) days., Disp: 1.8 mL, Rfl: 1   paliperidone  (INVEGA ) 3 MG 24 hr tablet, Take 3 mg by mouth at bedtime., Disp: , Rfl:    prazosin  (MINIPRESS ) 1 MG capsule, Take 1 capsule (1 mg total) by mouth at bedtime., Disp: 14 capsule, Rfl: 0   QUEtiapine  (SEROQUEL ) 200 MG tablet, Take 200 mg by mouth at bedtime., Disp: , Rfl:    traZODone  (DESYREL ) 50 MG tablet, Take 1 tablet (50 mg total) by mouth at bedtime as needed for up to 14 days for sleep., Disp: 14 tablet, Rfl: 0  Allergies: Allergies  Allergen Reactions   Penicillins Anaphylaxis   Amoxicillin Swelling   Percocet [Oxycodone-Acetaminophen ]    Vicodin [Hydrocodone-Acetaminophen ] Itching   Vicodin [Hydrocodone-Acetaminophen ]     Lynne Takemoto, NP

## 2023-09-29 NOTE — ED Notes (Signed)
 Patient was provided with breakfast tray at bedside.

## 2023-09-29 NOTE — Consult Note (Signed)
 [facility] Psychiatric Consult Follow up  Patient Name: .TRAVER MECKES  MRN: [id number]  DOB: [birth year]  Consult Order details:  Orders (From admission, onward)     Start     Ordered   09/28/23 2354  IP CONSULT TO PSYCHIATRY       Ordering Provider: [name], MD  Provider:  (Not yet assigned)  Question:  Reason for consult:  Answer:  Medication management   09/28/23 2353   09/28/23 2354  CONSULT TO CALL ACT TEAM       Ordering Provider: [name], MD  Provider:  (Not yet assigned)  Question:  Reason for Consult?  Answer:  Hallucinations   09/28/23 2353             Mode of Visit: Follow up done in person    Psychiatry Consult Evaluation  Service Date: September 29, 2023 LOS:  LOS: 0 days  Chief Complaint Auditory Hallucinations  Primary Psychiatric Diagnoses  Schizophrenia 2.  Bipolar disorder  Assessment  YOEL KAUFHOLD is a 33 y.o. male admitted: Presented to the EDfor 09/28/2023 11:33 PM for auditory hallucinations, medication noncompliance, and passive suicidal ideation. He carries the psychiatric diagnoses of schizophrenia, bipolar disorder, and polysubstance abuse.  His current presentation of AH, paranoia, disorganized thought process, and poor insight is most consistent with acute psychosis due to medication noncompliance, possibly exacerbated by substance use (pending UDS).  Current outpatient psychotropic medications are unknown due to poor recall, but patient reports previously being prescribed antipsychotic and mood-stabilizing agents. He historically had a partial to positive response to medications. He was non-compliant with medications prior to admission, evidenced by return of psychotic symptoms and voice command hallucinations.  On initial examination, patient is extremely drowsy, and is not fully cooperative or reliable due to sedation.  09/29/2023: Today on exam, patient is noted to be lying in bed.  Patient reported feeling  good.  Patient stated I got food and I feel better.  Patient reported he is currently staying at home with his aunt.  Patient reported he feels safe where he lives.  Today patient denied suicidal ideations and homicidal ideations.  Patient also denied auditory or visual hallucinations.On current presentation there was no evidence of psychosis or mania and patient did not appear to be responding to internal stimuli. At this time, patient does not appear to be a risk to self or others.  Patient reported he is currently following up with [facility] outpatient.  He denied inability to afford medications today.  At this time patient does not meet criteria for IVC or inpatient admission and patient desires to leave the hospital.  Legal guardian notified by nursing and TTS of disposition plan.  Diagnoses:  Active Hospital problems: Active Problems:   * No active hospital problems. *    Plan   ## Psychiatric Medication Recommendations:  Zyprexa  10 mg at bedtime - made by Jon Aldrich, NP  ## Medical Decision Making Capacity: Not specifically addressed in this encounter   ## Disposition:-- overnight observation; reassess  ## Behavioral / Environmental: - No specific recommendations at this time.     ## Safety and Observation Level:  - Based on my clinical evaluation, I estimate the patient to be at low risk of self harm in the current setting. - At this time, we recommend  routine. This decision is based on my review of the chart including patient's history and current presentation, interview of the patient, mental status examination, and consideration of suicide risk including  evaluating suicidal ideation, plan, intent, suicidal or self-harm behaviors, risk factors, and protective factors. This judgment is based on our ability to directly address suicide risk, implement suicide prevention strategies, and develop a safety plan while the patient is in the clinical setting. Please contact our team if  there is a concern that risk level has changed.  CSSR Risk Category:C-SSRS RISK CATEGORY: No Risk  Suicide Risk Assessment: Patient has following modifiable risk factors for suicide: triggering events, which we are addressing by overnight observation. Patient has following non-modifiable or demographic risk factors for suicide: male gender Patient has the following protective factors against suicide: Access to outpatient mental health care  Thank you for this consult request. Recommendations have been communicated to the primary team.  We will not recommend inpatient admission at this time.   Zelda Sharps, NP        History of Present Illness  Relevant Aspects of Hospital ED Course:  Admitted on 09/28/2023 for auditory hallucinations, medication noncompliance, and passive suicidal ideation.  Patient Report:  [name] is a 33 year old male with a past psychiatric history of schizophrenia, bipolar disorder, and polysubstance abuse, who presents to the ED voluntarily requesting psychiatric evaluation. The patient reports hearing a voice telling him that he would be killed if he did not come to the hospital. He denies current suicidal ideation, homicidal ideation, or visual hallucinations but does endorse passive thoughts about death.  He denies recent drug use; however, urine drug screen is pending to verify. The patient admits to being off his medications but does not recall specific names. He denies current medical complaints. He states he lost his phone and became scared, which contributed to his decision to seek help. The patient reports wanting to return to inpatient psychiatric care.  See above under assessment for today's assessment.  Patient desired to go home today.  He denied suicidal and homicidal ideations as well as auditory and visual hallucinations today.  Psych ROS:  Depression: no Anxiety:  yes Mania (lifetime and current): no Psychosis: (lifetime and current):  yes   Review of Systems  Psychiatric/Behavioral:  Positive for substance abuse. Negative for depression, hallucinations and suicidal ideas.      Psychiatric and Social History  Psychiatric History: Collected by Angela McLauchlin, NP Information collected from Patient history  Prev Dx/Sx: Schizophrenia Current Psych Provider: [facility] Home Meds (current): unknown Previous Med Trials: unknown Therapy: unknown  Prior Psych Hospitalization: yes  Prior Self Harm: unknown Prior Violence: unknown  Family Psych History: unknown Family Hx suicide: unknown  Social History:  Developmental Hx: unknown Educational Hx: unknown Occupational Hx: unknown Legal Hx: unknown Living Situation: alone Spiritual Hx: unknown Access to weapons/lethal means: no   Substance History Alcohol: denies  Tobacco: denies Illicit drugs: denies Prescription drug abuse: denies Rehab hx: unknown  Exam Findings   Vital Signs:  Temp:  [98.1 F (36.7 C)-98.3 F (36.8 C)] 98.1 F (36.7 C) (09/23 0855) Pulse Rate:  [64-92] 64 (09/23 0855) Resp:  [16-17] 16 (09/23 0855) BP: (119-139)/(70-91) 119/70 (09/23 0855) SpO2:  [96 %-100 %] 100 % (09/23 0855) Weight:  [99.8 kg] 99.8 kg (09/22 2320) Blood pressure 119/70, pulse 64, temperature 98.1 F (36.7 C), temperature source Oral, resp. rate 16, height 5' (1.524 m), weight 99.8 kg, SpO2 100%. Body mass index is 42.97 kg/m.  Physical Exam HENT:     Head: Normocephalic.  Pulmonary:     Effort: Pulmonary effort is normal.  Skin:    General: Skin is dry.  Neurological:     Mental Status: He is oriented to person, place, and time.  Psychiatric:        Behavior: Behavior is not agitated or aggressive.        Thought Content: Thought content does not include homicidal or suicidal ideation. Thought content does not include homicidal or suicidal plan.      Other History   These have been pulled in through the EMR, reviewed, and updated if appropriate.   Family History:  The patient's family history is not on file.  Medical History: Past Medical History:  Diagnosis Date   Asthma    Bipolar 1 disorder (HCC)    Depression    Schizophrenia (HCC)    Schizotaxia     Surgical History: History reviewed. No pertinent surgical history.   Medications:  No current facility-administered medications for this encounter.  Current Outpatient Medications:    divalproex  (DEPAKOTE  ER) 500 MG 24 hr tablet, Take 2 tablets (1,000 mg total) by mouth at bedtime for 14 days., Disp: 28 tablet, Rfl: 0   paliperidone  (INVEGA  SUSTENNA) 234 MG/1.5ML injection, Inject 234 mg into the muscle every 28 (twenty-eight) days., Disp: 1.8 mL, Rfl: 1   paliperidone  (INVEGA ) 3 MG 24 hr tablet, Take 3 mg by mouth at bedtime., Disp: , Rfl:    prazosin  (MINIPRESS ) 1 MG capsule, Take 1 capsule (1 mg total) by mouth at bedtime., Disp: 14 capsule, Rfl: 0   QUEtiapine  (SEROQUEL ) 200 MG tablet, Take 200 mg by mouth at bedtime., Disp: , Rfl:    benztropine  (COGENTIN ) 0.5 MG tablet, Take 0.5 mg by mouth daily. (Patient not taking: Reported on 07/14/2023), Disp: , Rfl:    haloperidol  (HALDOL ) 10 MG tablet, Take 10 mg by mouth 3 (three) times daily. (Patient not taking: Reported on 07/14/2023), Disp: , Rfl:    nicotine  polacrilex (NICORETTE ) 2 MG gum, Take 1 each (2 mg total) by mouth as needed for smoking cessation. (Patient not taking: Reported on 07/29/2023), Disp: 100 tablet, Rfl: 0   traZODone  (DESYREL ) 50 MG tablet, Take 1 tablet (50 mg total) by mouth at bedtime as needed for up to 14 days for sleep., Disp: 14 tablet, Rfl: 0  Allergies: Allergies  Allergen Reactions   Penicillins Anaphylaxis   Amoxicillin Swelling   Percocet Cristóbal.Cosier ]    Vicodin Ignis.Heir ] Itching   Vicodin [Hydrocodone-Acetaminophen ]     Zelda Sharps, NP

## 2023-09-29 NOTE — BH Assessment (Signed)
 Comprehensive Clinical Assessment (CCA) Screening, Triage and Referral Note  09/29/2023 STIVEN KASPAR [id number] Recommendations for Services/Supports/Treatments: Consulted with NP Jon HERO., who recommended pt. for overnight observation and reassessment in the AM. Lonni D. Asbridge is a 33 year old, English speaking, Black male. Pt presented to [facility] ED Vol. Per triage note: Pt reports he is hearing voices, pt denies SI. Reports he hasn't ate and wants to lay down.  Chief Complaint: Patient asked, "My house is going well. Can I just be admitted to the hospital?" Mental Status Examination: On assessment, the patient appeared lethargic and reticent, frequently trailing off with mumbling throughout the interview. The patient reported auditory hallucinations, stating that he "hears voices." He acknowledged experiencing suicidal ideation (SI), saying, "I have plenty of thoughts, but I pray." The patient reported taking medications "to the best of his ability." He demonstrated fair insight but poor judgment. Thought processes were relevant, and psychomotor activity was within normal limits. When asked about substance use, the patient became non-responsive and eventually stopped answering questions. He denied current homicidal ideation (HI) and visual hallucinations (V/H). Labs: BAL: Unremarkable UDS: Pending Impression: Patient presents with psychotic symptoms, including auditory hallucinations and passive suicidal ideation, with poor engagement during assessment. Insight is limited, and judgment is impaired. Further evaluation is warranted once UDS results are available. TTS contacted on call services for [employer] - [phone]. TTS spoke with [name]. Yolanda agreed to contact pt's legal guardian Vinie Roughen [phone]) later in the morning.   Chief Complaint:  Chief Complaint  Patient presents with   Psychiatric Evaluation   Visit Diagnosis:  Schizophrenia  Patient Reported Information How did you hear about us ? Self  What Is the Reason for Your Visit/Call Today? Paraniod Ideation  How Long Has This Been Causing You Problems? 1 wk - 1 month  What Do You Feel Would Help You the Most Today? Alcohol or Drug Use Treatment; Treatment for Depression or other mood problem   Have You Recently Had Any Thoughts About Hurting Yourself? No  Are You Planning to Commit Suicide/Harm Yourself At This time? No   Have you Recently Had Thoughts About Hurting Someone [name]? No  Are You Planning to Harm Someone at This Time? No  Explanation: Patient denied SI/HI.   Have You Used Any Alcohol or Drugs in the Past 24 Hours? Yes  How Long Ago Did You Use Drugs or Alcohol? today 07/28/23  What Did You Use and How Much? Marijuana, 1 blunt   Do You Currently Have a Therapist/Psychiatrist? Yes  Name of Therapist/Psychiatrist: [facility]   Have You Been Recently Discharged From Any Office Practice or Programs? No  Explanation of Discharge From Practice/Program: n/a    CCA Screening Triage Referral Assessment Type of Contact: Face-to-Face  Telemedicine Service Delivery:   Is this Initial or Reassessment?   Date Telepsych consult ordered in [facility]:    Time Telepsych consult ordered in [facility]:    Location of Assessment: [facility] ED  Provider Location: [facility] ED    Collateral Involvement: [name] - Guardian on call for [employer] - [phone]   Does Patient Have a Court Appointed Legal Guardian? No data recorded Name and Contact of Legal Guardian: No data recorded If Minor and Not Living with Parent(s), Who has Custody? n/a  Is CPS involved or ever been involved? Never  Is APS involved or ever been involved? Never   Patient Determined To Be At Risk for Harm To Self or Others Based on Review of Patient Reported Information  or Presenting Complaint? No  Method: No Plan  Availability of Means: No access or NA  Intent: Vague  intent or NA  Notification Required: No need or identified person  Additional Information for Danger to Others Potential: -- (n/a)  Additional Comments for Danger to Others Potential: n/a  Are There Guns or Other Weapons in Your Home? No  Types of Guns/Weapons: No access  Are These Weapons Safely Secured?                            Yes  Who Could Verify You Are Able To Have These Secured: No access  Do You Have any Outstanding Charges, Pending Court Dates, Parole/Probation? None reported  Contacted To Inform of Risk of Harm To Self or Others: -- (n/a)   Does Patient Present under Involuntary Commitment? No    [place] of Residence: [place]   Patient Currently Receiving the Following Services: ACTT Psychologist, educational); Medication Management   Determination of Need: Urgent (48 hours)   Options For Referral: Inpatient Hospitalization; Medication Management; Outpatient Therapy; Chemical Dependency Intensive Outpatient Therapy (CDIOP)   Disposition Recommendation per psychiatric provider: Overnight observation and reassessment.   Jizelle Conkey R Shauntelle Jamerson, LCAS

## 2023-09-29 NOTE — BH Assessment (Signed)
 Writer left message on voicemail for patient's legal guardian, [name] [phone] to return call.

## 2023-09-29 NOTE — Discharge Instructions (Signed)
Return to the ER for new or worsening symptoms. °

## 2023-09-29 NOTE — ED Provider Notes (Signed)
 Received update from psychiatry team that they had reassessed the patient this morning and he was cleared for discharge.  Patient evaluated and denies SI, HI.  He is comfortable with plan for discharge home.  Strict return precautions provided.   Levander Slate, MD 09/29/23 1155

## 2023-09-29 NOTE — ED Notes (Signed)
 VOL/ observation and reasse in am

## 2023-10-22 ENCOUNTER — Other Ambulatory Visit: Payer: Self-pay

## 2023-10-22 ENCOUNTER — Encounter: Payer: Self-pay | Admitting: Intensive Care

## 2023-10-22 ENCOUNTER — Emergency Department
Admission: EM | Admit: 2023-10-22 | Discharge: 2023-10-24 | Disposition: A | Discharge status: Other Acute Inpatient Hospital | Payer: Medicare (Managed Care) | Attending: Emergency Medicine | Admitting: Emergency Medicine

## 2023-10-22 DIAGNOSIS — Z79899 Other long term (current) drug therapy: Secondary | ICD-10-CM | POA: Insufficient documentation

## 2023-10-22 DIAGNOSIS — F17210 Nicotine dependence, cigarettes, uncomplicated: Secondary | ICD-10-CM | POA: Insufficient documentation

## 2023-10-22 DIAGNOSIS — F29 Unspecified psychosis not due to a substance or known physiological condition: Secondary | ICD-10-CM | POA: Diagnosis not present

## 2023-10-22 DIAGNOSIS — F319 Bipolar disorder, unspecified: Secondary | ICD-10-CM | POA: Diagnosis not present

## 2023-10-22 DIAGNOSIS — F209 Schizophrenia, unspecified: Secondary | ICD-10-CM | POA: Diagnosis present

## 2023-10-22 DIAGNOSIS — F39 Unspecified mood [affective] disorder: Secondary | ICD-10-CM | POA: Insufficient documentation

## 2023-10-22 DIAGNOSIS — F1510 Other stimulant abuse, uncomplicated: Secondary | ICD-10-CM | POA: Insufficient documentation

## 2023-10-22 DIAGNOSIS — R440 Auditory hallucinations: Secondary | ICD-10-CM | POA: Diagnosis present

## 2023-10-22 NOTE — ED Provider Notes (Signed)
[facility] Provider Note    Event Date/Time   First MD Initiated Contact with Patient 10/22/23 1857     (approximate)   History   Mental Health Problem   HPI  Kenneth Jensen is a 33 y.o. male  with history of schizophrenia and frequent ER visits who presents to the emergency department today because of concern for hallucinations. Patient has a hard time explaining how long they have been present for. States that he has seen people pop up and that women have been putting voodoo on him.        Physical Exam   Triage Vital Signs: ED Triage Vitals [10/22/23 1844]  Encounter Vitals Group     BP 134/87     Girls Systolic BP Percentile      Girls Diastolic BP Percentile      Boys Systolic BP Percentile      Boys Diastolic BP Percentile      Pulse Rate 84     Resp 18     Temp 98.4 F (36.9 C)     Temp Source Oral     SpO2 99 %     Weight      Height      Head Circumference      Peak Flow      Pain Score      Pain Loc      Pain Education      Exclude from Growth Chart     Most recent vital signs: Vitals:   10/22/23 1844  BP: 134/87  Pulse: 84  Resp: 18  Temp: 98.4 F (36.9 C)  SpO2: 99%   General: Awake, alert, slightly agitated CV:  Good peripheral perfusion.  Resp:  Normal effort.  Abd:  No distention.    ED Results / Procedures / Treatments   Labs (all labs ordered are listed, but only abnormal results are displayed) Labs Reviewed  COMPREHENSIVE METABOLIC PANEL WITH GFR  ETHANOL  CBC  URINE DRUG SCREEN, QUALITATIVE ([facility] ONLY)     EKG  None   RADIOLOGY None   PROCEDURES:  Critical Care performed: No   MEDICATIONS ORDERED IN ED: Medications - No data to display   IMPRESSION / MDM / ASSESSMENT AND PLAN / ED COURSE  I reviewed the triage vital signs and the nursing notes.                              Differential diagnosis includes, but is not limited to, drug induced mood disorder, psychiatric  illness  Patient's presentation is most consistent with acute presentation with potential threat to life or bodily function.   Patient presented to the emergency department today because of concerns for hallucinations.  Patient has history of schizophrenia and somewhat frequent ER visits for the same.  Will have psychiatry evaluate.  The patient has been placed in psychiatric observation due to the need to provide a safe environment for the patient while obtaining psychiatric consultation and evaluation, as well as ongoing medical and medication management to treat the patient's condition.  The patient has not been placed under full IVC at this time.      FINAL CLINICAL IMPRESSION(S) / ED DIAGNOSES   Final diagnoses:  Psychosis, unspecified psychosis type (HCC)       Note:  This document was prepared using Dragon voice recognition software and may include unintentional dictation errors.    Floy Roberts, MD  10/22/23 1924  

## 2023-10-22 NOTE — BH Assessment (Signed)
 This Clinical research associate attempted to contact pt's legal guardian Kenneth Jensen [phone]) to notify guardian of pt's arrival; however there was no answer. A HIPAA compliant voicemail was left requesting a call back.

## 2023-10-22 NOTE — ED Triage Notes (Signed)
 Patient came through ER door, yelling at registration, that she should know his name and put him in system quick. Patient reports auditory hallucinations. He states I need to go in a room by myself

## 2023-10-22 NOTE — ED Notes (Signed)
 When attempting to draw patients blood, patient pulled hand away and started licking the blood off of his hand. Patient loud in triage and verbally and body language is aggressive

## 2023-10-22 NOTE — BH Assessment (Addendum)
 Comprehensive Clinical Assessment (CCA) Screening, Triage and Referral Note  10/22/2023 Kenneth Jensen [id number] Kenneth Jensen is a 33 year old, English speaking, Black male. Pt presented to [facility] ED Vol. Per triage note: Patient came through ER door, yelling at registration, that she should know his name and put him in system quick. Patient reports auditory hallucinations. He states I need to go in a room by myself   Presenting Problem: Patient presented to the ED with disorganized thought processes and worsening auditory hallucinations. Despite seeking help, the patient was fixated on being discharged due to grievances with hospital treatment. Mental Status Examination:  Thought Process: Disorganized and perseverative. Mood/Affect: Labile affect, congruent with stated mood. Insight/Judgment: Limited insight; judgment impaired by fixation on discharge and external stressors. Speech: Coherent but tangential. Behavior: Became slightly agitated during assessment, ceased responding to questions, and pulled blanket over head. Perception: Patient reported hearing voices stating, "You ain't gone make it; you're a broke inconsiderate bastard, you're never going to grow." Denied current visual or auditory hallucinations at time of assessment. Suicidality/Homicidality: Denied current suicidal ideation. Vague homicidal ideation noted without specific target or plan.  Psychosocial Stressors:  Family conflict Paranoia about dying Unstable self-concept Poor sleep and decreased appetite Concerns about food stamp card being stolen Desire to relocate to [facility]  Substance Use: Patient admitted to using marijuana and cocaine approximately one week ago. Psychiatric History:  Reports symptoms of depression and paranoia Connected to ACT Team Reports medication compliance  Assessment Summary: Patient presents with acute psychiatric symptoms including disorganized thinking, auditory  hallucinations, paranoia, and vague homicidal ideation. Substance use may be contributing to symptom exacerbation. Patient is currently connected to outpatient services (ACT Team) and reports medication adherence. Chief Complaint:  Chief Complaint  Patient presents with   Mental Health Problem   Visit Diagnosis: Schizophrenia 2.  Bipolar disorder   Patient Reported Information How did you hear about us ? Self  What Is the Reason for Your Visit/Call Today? Pt reports he is hearing voices, pt denies SI. Reports he hasn't ate and wants to lay down  How Long Has This Been Causing You Problems? 1-6 months  What Do You Feel Would Help You the Most Today? Stress Management; Treatment for Depression or other mood problem   Have You Recently Had Any Thoughts About Hurting Yourself? Yes  Are You Planning to Commit Suicide/Harm Yourself At This time? No   Have you Recently Had Thoughts About Hurting Someone Kenneth Jensen? No  Are You Planning to Harm Someone at This Time? No  Explanation: Pt endorsed having SI without a plan/intent.   Have You Used Any Alcohol or Drugs in the Past 24 Hours? -- (Pt refused to confirm/deny substance use)  How Long Ago Did You Use Drugs or Alcohol? today 07/28/23  What Did You Use and How Much? UTA   Do You Currently Have a Therapist/Psychiatrist? Yes  Name of Therapist/Psychiatrist: [facility]   Have You Been Recently Discharged From Any Office Practice or Programs? No  Explanation of Discharge From Practice/Program: n/a    CCA Screening Triage Referral Assessment Type of Contact: Face-to-Face  Telemedicine Service Delivery:   Is this Initial or Reassessment?   Date Telepsych consult ordered in [facility]:    Time Telepsych consult ordered in [facility]:    Location of Assessment: [facility] ED  Provider Location: [facility] ED    Collateral Involvement: Kenneth Jensen - Guardian on call for Empower[employer] lives - [phone]   Does Patient Have a Court Appointed Legal  Guardian? No  data recorded Name and Contact of Legal Guardian: No data recorded If Minor and Not Living with Parent(s), Who has Custody? n/a  Is CPS involved or ever been involved? Never  Is APS involved or ever been involved? Never   Patient Determined To Be At Risk for Harm To Self or Others Based on Review of Patient Reported Information or Presenting Complaint? No  Method: No Plan  Availability of Means: No access or NA  Intent: Vague intent or NA  Notification Required: No need or identified person  Additional Information for Danger to Others Potential: -- (n/a)  Additional Comments for Danger to Others Potential: n/a  Are There Guns or Other Weapons in Your Home? No  Types of Guns/Weapons: No access  Are These Weapons Safely Secured?                            -- (n/a)  Who Could Verify You Are Able To Have These Secured: n/a  Do You Have any Outstanding Charges, Pending Court Dates, Parole/Probation? None reported  Contacted To Inform of Risk of Harm To Self or Others: -- (n/a)   Does Patient Present under Involuntary Commitment? No    County of Residence: [place]   Patient Currently Receiving the Following Services: ACTT Psychologist, educational)   Determination of Need: Emergent (2 hours)   Options For Referral: ED Visit   Disposition Recommendation per psychiatric provider: Pending psych consult  [name], LCAS

## 2023-10-22 NOTE — ED Notes (Signed)
 Patient not agreeable to blood draw at this time.

## 2023-10-23 DIAGNOSIS — F39 Unspecified mood [affective] disorder: Secondary | ICD-10-CM

## 2023-10-23 DIAGNOSIS — F29 Unspecified psychosis not due to a substance or known physiological condition: Secondary | ICD-10-CM | POA: Diagnosis not present

## 2023-10-23 LAB — CBC
HCT: 38.5 % — ABNORMAL LOW (ref 39.0–52.0)
Hemoglobin: 12.8 g/dL — ABNORMAL LOW (ref 13.0–17.0)
MCH: 28.8 pg (ref 26.0–34.0)
MCHC: 33.2 g/dL (ref 30.0–36.0)
MCV: 86.7 fL (ref 80.0–100.0)
Platelets: 316 K/uL (ref 150–400)
RBC: 4.44 MIL/uL (ref 4.22–5.81)
RDW: 13.2 % (ref 11.5–15.5)
WBC: 9.1 K/uL (ref 4.0–10.5)
nRBC: 0 % (ref 0.0–0.2)

## 2023-10-23 LAB — COMPREHENSIVE METABOLIC PANEL WITH GFR
ALT: 9 U/L (ref 0–44)
AST: 16 U/L (ref 15–41)
Albumin: 3.5 g/dL (ref 3.5–5.0)
Alkaline Phosphatase: 46 U/L (ref 38–126)
Anion gap: 10 (ref 5–15)
BUN: 9 mg/dL (ref 6–20)
CO2: 28 mmol/L (ref 22–32)
Calcium: 8.6 mg/dL — ABNORMAL LOW (ref 8.9–10.3)
Chloride: 100 mmol/L (ref 98–111)
Creatinine, Ser: 0.8 mg/dL (ref 0.61–1.24)
GFR, Estimated: >60 mL/min (ref >60)
Glucose, Bld: 87 mg/dL (ref 70–99)
Potassium: 3.6 mmol/L (ref 3.5–5.1)
Sodium: 138 mmol/L (ref 135–145)
Total Bilirubin: 0.5 mg/dL (ref 0.0–1.2)
Total Protein: 6.9 g/dL (ref 6.5–8.1)

## 2023-10-23 LAB — URINE DRUG SCREEN, QUALITATIVE (ARMC ONLY)
Amphetamines, Ur Screen: NOT DETECTED
Barbiturates, Ur Screen: NOT DETECTED
Benzodiazepine, Ur Scrn: NOT DETECTED
Cannabinoid 50 Ng, Ur ~~LOC~~: POSITIVE — AB
Cocaine Metabolite,Ur ~~LOC~~: NOT DETECTED
MDMA (Ecstasy)Ur Screen: NOT DETECTED
Methadone Scn, Ur: NOT DETECTED
Opiate, Ur Screen: NOT DETECTED
Phencyclidine (PCP) Ur S: NOT DETECTED
Tricyclic, Ur Screen: NOT DETECTED

## 2023-10-23 LAB — ETHANOL: Alcohol, Ethyl (B): <15 mg/dL (ref <15)

## 2023-10-23 MED ORDER — OLANZAPINE 10 MG PO TBDP
ORAL_TABLET | ORAL | Status: AC
  Filled 2023-10-23: qty 1

## 2023-10-23 MED ORDER — OLANZAPINE 10 MG IM SOLR: 10.0000 mg | Freq: Two times a day (BID) | INTRAMUSCULAR | Status: DC | PRN

## 2023-10-23 MED ORDER — OLANZAPINE 10 MG PO TBDP
10.0000 mg | ORAL_TABLET | Freq: Two times a day (BID) | ORAL | Status: DC | PRN
  Administered 2023-10-23: 10 mg via ORAL

## 2023-10-23 NOTE — ED Provider Notes (Signed)
 Emergency Medicine Observation Re-evaluation Note  Kenneth Jensen is a 33 y.o. male, seen on rounds today.  Pt initially presented to the ED for complaints of Mental Health Problem Currently, the patient is resting comfortably, no issues overnight.  Physical Exam  BP (!) 133/91   Pulse 78   Temp 98.5 F (36.9 C) (Oral)   Resp 18   SpO2 97%  Physical Exam Patient appears well, no acute distress, normal WOB    ED Course / MDM  EKG:   I have reviewed the labs performed to date as well as medications administered while in observation.  Recent changes in the last 24 hours include none.  Plan  Current plan is for psychiatric admission, pending placement.    [name], MD 10/23/23 [phone]

## 2023-10-23 NOTE — ED Notes (Signed)
 Patient restless.  Repeatedly stating he wishes to leave the hospital and go home, unable to redirect. Dr. Willo alerted and A. Smith NP to bedside to evaluate patient. Decision made to place patient under IVC hold and proceed with psych's recommendation for inpatient treatment.  Zyprexa  po given to patient and explained to reduce agitation, patient accepted medication and took without resistance.

## 2023-10-23 NOTE — ED Notes (Signed)
Vol /psych consult ordered/ pending  

## 2023-10-23 NOTE — ED Notes (Signed)
 Patet to interview room for tele psych evaluation.  Remained calm and cooperative. Tolerated well.

## 2023-10-23 NOTE — BH Assessment (Signed)
 Patient accepted to [facility] but is unable to arrive today due to lack of transportation from the sheriff department. Per [place] (Juttira F.) they will hold the bed for tomorrow (10/24/2023)

## 2023-10-23 NOTE — BH Assessment (Signed)
 Per [facility] M.,), patient to be referred out of system.  Referral information for Psychiatric Hospitalization faxed to;   Service Provider Phone  [facility]  [phone]  [facility]  [phone]  [facility]  [phone]  [facility]  [phone]  [facility]  [phone]  [facility]  [phone]  [facility]  [phone]  [facility]  [phone]  [facility]  [phone]  [facility]  [phone]  [facility]  [phone]  [place] [facility]  [phone]  [facility]  [phone]  [facility]  [phone]  [facility]  [phone]

## 2023-10-23 NOTE — ED Notes (Signed)
 Lunch tray provided to pt.

## 2023-10-23 NOTE — ED Notes (Signed)
 IVC PENDING  CONSULT ?

## 2023-10-23 NOTE — ED Notes (Signed)
VOL/Psych consult ordered/pending

## 2023-10-23 NOTE — ED Notes (Signed)
 IVC CONSULT  DONE  PT  GOING TO  [facility]  ON 10/24/23

## 2023-10-23 NOTE — ED Notes (Signed)
Snacks given to pt.

## 2023-10-23 NOTE — Consult Note (Signed)
 Kenneth Jensen  Patient Name: Kenneth Jensen MRN: [id number] DOB: [birth year] DATE OF Consult: 10/23/2023  PRIMARY PSYCHIATRIC DIAGNOSES  1.  Unspecified psychosis  2.  Unspecified mood disorder 3.  Rule out substance induced psychosis/mood disorder    RECOMMENDATIONS  Recommendations: Medication recommendations: Please reconcile home medication; Recommend olanzapine  10mg  po q8h PRN moderate agitation; Recommend olanzapine  10mg  IM PRN emergent agitation  Non-Medication/therapeutic recommendations: Psychiatric hospitalization Is inpatient psychiatric hospitalization recommended for this patient? Yes (Explain why): Patient labile, paranoid, with auditory hallucinations and does not appear at his baseline per chart review  Follow-Up Telepsychiatry C/L services: We will sign off for now. Please re-consult our service if needed for any concerning changes in the patient's condition, discharge planning, or questions. Communication: Treatment team members (and family members if applicable) who were involved in treatment/care discussions and planning, and with whom we spoke or engaged with via secure text/chat, include the following: Team via Epic chat   Kenneth Jensen is a 33 year old male with a history of schizophrenia, bipolar disorder, stimulant use disorder who presents to the ED with hallucinations. Chart reviewed. Patient with frequent ED presentations that sometimes result in psychiatric hospitalization and sometimes not depending on the severity of his symptoms. No UDS resulted at the time of evaluation. Psychiatry consulted for evaluation and management. On evaluation, patient noted to be speaking loudly, irritable, labile, suspicious, disorganized, actively responding to internal stimuli. Patient unable to engage in safety planning, does not appear to be at his baseline per chart review. Recommend inpatient psychiatric hospitalization for stabilization.   Thank you  for involving us  in the care of this patient. If you have any additional questions or concerns, please call [phone] and ask for me or the provider on-call.  TELEPSYCHIATRY ATTESTATION & CONSENT  As the provider for this telehealth consult, I attest that I verified the patient's identity using two separate identifiers, introduced myself to the patient, provided my credentials, disclosed my location, and performed this encounter via a HIPAA-compliant, real-time, face-to-face, two-way, interactive audio and video platform and with the full consent and agreement of the patient (or guardian as applicable.)  Patient physical location: ED in [facility]  Telehealth provider physical location: home office in state of [place]   Video start time: 802 AM EDT Video end time: 814 AM EDT  IDENTIFYING DATA  Kenneth Jensen is a 33 y.o. year-old male for whom a psychiatric consultation has been ordered by the primary provider. The patient was identified using two separate identifiers.  CHIEF COMPLAINT/REASON FOR CONSULT  Psychosis   HISTORY OF PRESENT ILLNESS (HPI)  Kenneth Jensen is a 33 year old male with a history of schizophrenia, bipolar disorder, stimulant use disorder who presents to the ED with hallucinations. Chart reviewed. Patient with frequent ED presentations that sometimes result in psychiatric hospitalization and sometimes not depending on the severity of his symptoms. No UDS resulted at the time of evaluation. Psychiatry consulted for evaluation and management.   On evaluation, patient noted to be speaking loudly, irritable, labile, suspicious, disorganized, actively responding to internal stimuli. Patient reports he came to the ED due to hearing voices. He states the voices say You're not going to be nothing. You're sorry. Denies command hallucinations. Patient states, I need to clear my head! Patient reports he lives in an apartment. He states his ACT team won't let him  move. He wants to get transferred to [facility]. Patient states he needs another apartment because I don't want to stay there no more.  Reports he doesn't feel safe in his apartment, declines to stay the reason. When inquire if patient is taking his psychiatric medication he states, Yes, yes, yes. Patient reports he is annoyed because he doesn't like to wake up early and becomes irritable. Patient denies the use of drugs and vehemently states No!  Patient reports he has been taking his psychiatric medications. He does not know what they are. He denies suicidal ideation, intent, plan. When inquire if patient has thoughts to harm others he states, If it comes to that. Denies homicidal ideation, intent, plan. Patient states, If I went back home money hungry people would keep asking me for money. Per chart review, patient endorses recent cocaine use, which he does not disclose to this Clinical research associate. Patient reports he does not feel safe to return to his apartment.   Attempted to call patient's guardian, Kenneth Jensen x [phone]), there was no answer.   PAST PSYCHIATRIC HISTORY  ACT team  Numerous psychiatric hospitalizations  Denies suicide attempts Denies history of violence    Otherwise as per HPI above.  PAST MEDICAL HISTORY  Past Medical History:  Diagnosis Date   Asthma    Bipolar 1 disorder (HCC)    Depression    Schizophrenia (HCC)    Schizotaxia      HOME MEDICATIONS  PTA Medications  Medication Sig   paliperidone  (INVEGA  SUSTENNA) 234 MG/1.5ML injection Inject 234 mg into the muscle every 28 (twenty-eight) days.   nicotine  polacrilex (NICORETTE ) 2 MG gum Take 1 each (2 mg total) by mouth as needed for smoking cessation. (Patient not taking: Reported on 07/29/2023)   paliperidone  (INVEGA ) 3 MG 24 hr tablet Take 3 mg by mouth at bedtime.   benztropine  (COGENTIN ) 0.5 MG tablet Take 0.5 mg by mouth daily. (Patient not taking: Reported on 07/14/2023)   haloperidol  (HALDOL ) 10 MG tablet  Take 10 mg by mouth 3 (three) times daily. (Patient not taking: Reported on 07/14/2023)   QUEtiapine  (SEROQUEL ) 200 MG tablet Take 200 mg by mouth at bedtime.     ALLERGIES  Allergies  Allergen Reactions   Penicillins Anaphylaxis   Amoxicillin Swelling   Percocet [Oxycodone-Acetaminophen ]    Vicodin [Hydrocodone-Acetaminophen ] Itching   Vicodin [Hydrocodone-Acetaminophen ]     SOCIAL & SUBSTANCE USE HISTORY  Social History   Socioeconomic History   Marital status: Single    Spouse name: Not on file   Number of children: Not on file   Years of education: Not on file   Highest education level: Not on file  Occupational History   Not on file  Tobacco Use   Smoking status: Every Day    Current packs/day: 0.50    Types: Cigarettes   Smokeless tobacco: Never  Substance and Sexual Activity   Alcohol use: Yes    Comment: social   Drug use: Yes    Types: Marijuana, Cocaine    Comment: last marijuana use 09/02/22   Sexual activity: Not Currently  Other Topics Concern   Not on file  Social History Narrative   ** Merged History Encounter **       Social Drivers of Health   Financial Resource Strain: Unknown (01/30/2018)   Received from [facility]   Overall Financial Resource Strain (CARDIA)    Difficulty of Paying Living Expenses: Patient declined  Food Insecurity: Food Insecurity Present (09/04/2022)   Hunger Vital Sign    Worried About Running Out of Food in the Last Year: Sometimes true    Ran  Out of Food in the Last Year: Sometimes true  Transportation Needs: Unmet Transportation Needs (09/04/2022)   PRAPARE - Transportation    Lack of Transportation (Medical): Yes    Lack of Transportation (Non-Medical): Yes  Physical Activity: Unknown (01/30/2018)   Received from [facility]   Exercise Vital Sign    On average, how many days per week do you engage in moderate to strenuous exercise (like a brisk walk)?: Patient declined    On average,  how many minutes do you engage in exercise at this level?: Patient declined  Stress: Unknown (01/30/2018)   Received from [facility] - Occupational Stress Questionnaire    Feeling of Stress : Patient declined  Social Connections: Unknown (01/30/2018)   Received from [facility]   Social Connection and Isolation Panel    In a typical week, how many times do you talk on the phone with family, friends, or neighbors?: Patient declined    How often do you get together with friends or relatives?: Patient declined    How often do you attend church or religious services?: Patient declined    Do you belong to any clubs or organizations such as church groups, unions, fraternal or athletic groups, or school groups?: Patient declined    How often do you attend meetings of the clubs or organizations you belong to?: Patient declined    Are you married, widowed, divorced, separated, never married, or living with a partner?: Patient declined   Social History   Tobacco Use  Smoking Status Every Day   Current packs/day: 0.50   Types: Cigarettes  Smokeless Tobacco Never   Social History   Substance and Sexual Activity  Alcohol Use Yes   Comment: social   Social History   Substance and Sexual Activity  Drug Use Yes   Types: Marijuana, Cocaine   Comment: last marijuana use 09/02/22    Patient denies to this writer, no UDS resulted. Per chart review, endorsed recent cocaine use    FAMILY HISTORY   Family Psychiatric History (if known):  Unable to assess due to patient condition   MENTAL STATUS EXAM (MSE)  Mental Status Exam: General Appearance: Fairly Groomed  Orientation:  Full (Time, Place, and Person)  Memory:  Immediate;   Fair Recent;   Fair  Concentration:  Concentration: Fair  Recall:  Fair  Attention  Fair  Eye Contact:  Fair  Speech:  Normal Rate  Language:  Good  Volume:  Increased  Mood: Upset   Affect:  Labile  Thought Process:  Disorganized  Thought Content:  Hallucinations: Auditory and Paranoid Ideation  Suicidal Thoughts:  No  Homicidal Thoughts:  No  Judgement:  Impaired  Insight:  Lacking  Psychomotor Activity:  Increased  Akathisia:  Negative  Fund of Knowledge:  Fair    Assets:  Manufacturing systems engineer Social Support  Cognition:  WNL  ADL's:  Intact  AIMS (if indicated):       VITALS  Blood pressure 134/87, pulse 84, temperature 98.4 F (36.9 C), temperature source Oral, resp. rate 18, SpO2 99%.  LABS  Admission on 10/22/2023  Component Date Value Ref Range Status   Sodium 10/23/2023 138  135 - 145 mmol/L Final   Potassium 10/23/2023 3.6  3.5 - 5.1 mmol/L Final   Chloride 10/23/2023 100  98 - 111 mmol/L Final   CO2 10/23/2023 28  22 - 32 mmol/L Final   Glucose, Bld 10/23/2023  87  70 - 99 mg/dL Final   Glucose reference range applies only to samples taken after fasting for at least 8 hours.   BUN 10/23/2023 9  6 - 20 mg/dL Final   Creatinine, Ser 10/23/2023 0.80  0.61 - 1.24 mg/dL Final   Calcium 89/82/7974 8.6 (L)  8.9 - 10.3 mg/dL Final   Total Protein 89/82/7974 6.9  6.5 - 8.1 g/dL Final   Albumin 89/82/7974 3.5  3.5 - 5.0 g/dL Final   AST 89/82/7974 16  15 - 41 U/L Final   ALT 10/23/2023 9  0 - 44 U/L Final   Alkaline Phosphatase 10/23/2023 46  38 - 126 U/L Final   Total Bilirubin 10/23/2023 0.5  0.0 - 1.2 mg/dL Final   GFR, Estimated 10/23/2023 >60  >60 mL/min Final   Comment: (Jensen) Calculated using the CKD-EPI Creatinine Equation (2021)    Anion gap 10/23/2023 10  5 - 15 Final   Performed at [facility], [address] [place], [place] [address]   Alcohol, Ethyl (B) 10/23/2023 <15  <15 mg/dL Final   Comment: (Jensen) For medical purposes only. Performed at [facility], [address] [place], [place] [address]    WBC 10/23/2023 9.1  4.0 - 10.5 K/uL Final   RBC 10/23/2023 4.44  4.22 - 5.81 MIL/uL Final   Hemoglobin  10/23/2023 12.8 (L)  13.0 - 17.0 g/dL Final   HCT 89/82/7974 38.5 (L)  39.0 - 52.0 % Final   MCV 10/23/2023 86.7  80.0 - 100.0 fL Final   MCH 10/23/2023 28.8  26.0 - 34.0 pg Final   MCHC 10/23/2023 33.2  30.0 - 36.0 g/dL Final   RDW 89/82/7974 13.2  11.5 - 15.5 % Final   Platelets 10/23/2023 316  150 - 400 K/uL Final   nRBC 10/23/2023 0.0  0.0 - 0.2 % Final   Performed at [facility], [address] [place], [place] [address]    PSYCHIATRIC REVIEW OF SYSTEMS (ROS)  ROS: Notable for the following relevant positive findings: Review of Systems  Psychiatric/Behavioral:  Positive for hallucinations and substance abuse.     Additional findings:      Musculoskeletal: No abnormal movements observed      Gait & Station: Laying/Sitting      Pain Screening: Denies      Nutrition & Dental Concerns: n/a  RISK FORMULATION/ASSESSMENT  Is the patient experiencing any suicidal or homicidal ideations: No    Protective factors considered for safety management: Supervised setting  Risk factors/concerns considered for safety management:  Substance abuse/dependence Impulsivity Male gender Unmarried  Is there a safety management plan with the patient and treatment team to minimize risk factors and promote protective factors: Yes           Explain: Psychiatric hospitalization  Is crisis care placement or psychiatric hospitalization recommended: Yes     Based on my current evaluation and risk assessment, patient is determined at this time to be at:  High risk  *RISK ASSESSMENT Risk assessment is a dynamic process; it is possible that this patient's condition, and risk level, may change. This should be re-evaluated and managed over time as appropriate. Please re-consult psychiatric consult services if additional assistance is needed in terms of risk assessment and management. If your team decides to discharge this patient, please advise the patient how to best access emergency psychiatric  services, or to call 911, if their condition worsens or they feel unsafe in any way.   Malaia Buchta  Kenneth Rase, MD Telepsychiatry Consult Services

## 2023-10-23 NOTE — ED Notes (Signed)
 Hospital meal provided, pt tolerated w/o complaints.  Waste discarded appropriately.

## 2023-10-23 NOTE — BH Assessment (Signed)
 Patient has been accepted to [facility].  Patient assigned to [facility] Accepting physician is Dr. LOIS Manners.  Call report to [phone] option 2.  Representative was [employer].   ER Staff is aware of it:  Jon, ER Gaffer, Patient's Nurse      Address: [address]  [place], [place] [address]

## 2023-10-24 NOTE — ED Notes (Signed)
 IVC patient has been transported to [facility] by [facility] Department at 8:53 pm.

## 2023-10-24 NOTE — ED Notes (Signed)
 Pt belongings retrieved from locker and given to sheriff to transport with pt. 2 labelled belongings bags and and orange backpack sent with pt.

## 2023-10-24 NOTE — ED Notes (Signed)
 Pt informed of impending transfer to [facility]. Pt states he was not informed of this and is not agreeable to plan due to his family not being in the area. Verbal reassurance given to the pt.

## 2023-10-24 NOTE — ED Notes (Signed)
 ED secretary notified of report given to [facility]- awaiting ETA for transportation.

## 2023-10-24 NOTE — ED Notes (Signed)
 EMTALA Reviewed by this RN.

## 2023-11-15 ENCOUNTER — Emergency Department
Admission: EM | Admit: 2023-11-15 | Discharge: 2023-11-15 | Disposition: A | Discharge status: Home / Self Care | Payer: Medicare (Managed Care) | Attending: Emergency Medicine | Admitting: Emergency Medicine

## 2023-11-15 ENCOUNTER — Other Ambulatory Visit: Payer: Self-pay

## 2023-11-15 DIAGNOSIS — R782 Finding of cocaine in blood: Secondary | ICD-10-CM | POA: Diagnosis not present

## 2023-11-15 DIAGNOSIS — Z79899 Other long term (current) drug therapy: Secondary | ICD-10-CM | POA: Diagnosis not present

## 2023-11-15 DIAGNOSIS — F209 Schizophrenia, unspecified: Secondary | ICD-10-CM | POA: Insufficient documentation

## 2023-11-15 DIAGNOSIS — J45909 Unspecified asthma, uncomplicated: Secondary | ICD-10-CM | POA: Insufficient documentation

## 2023-11-15 DIAGNOSIS — F29 Unspecified psychosis not due to a substance or known physiological condition: Secondary | ICD-10-CM | POA: Insufficient documentation

## 2023-11-15 DIAGNOSIS — F319 Bipolar disorder, unspecified: Secondary | ICD-10-CM | POA: Diagnosis present

## 2023-11-15 DIAGNOSIS — F1910 Other psychoactive substance abuse, uncomplicated: Secondary | ICD-10-CM

## 2023-11-15 LAB — URINE DRUG SCREEN, QUALITATIVE (ARMC ONLY)
Amphetamines, Ur Screen: NOT DETECTED
Barbiturates, Ur Screen: NOT DETECTED
Benzodiazepine, Ur Scrn: NOT DETECTED
Cannabinoid 50 Ng, Ur ~~LOC~~: POSITIVE — AB
Cocaine Metabolite,Ur ~~LOC~~: POSITIVE — AB
MDMA (Ecstasy)Ur Screen: NOT DETECTED
Methadone Scn, Ur: NOT DETECTED
Opiate, Ur Screen: NOT DETECTED
Phencyclidine (PCP) Ur S: NOT DETECTED
Tricyclic, Ur Screen: NOT DETECTED

## 2023-11-15 LAB — COMPREHENSIVE METABOLIC PANEL WITH GFR
ALT: 33 U/L (ref 0–44)
AST: 62 U/L — ABNORMAL HIGH (ref 15–41)
Albumin: 4.1 g/dL (ref 3.5–5.0)
Alkaline Phosphatase: 65 U/L (ref 38–126)
Anion gap: 14 (ref 5–15)
BUN: 8 mg/dL (ref 6–20)
CO2: 24 mmol/L (ref 22–32)
Calcium: 8.7 mg/dL — ABNORMAL LOW (ref 8.9–10.3)
Chloride: 100 mmol/L (ref 98–111)
Creatinine, Ser: 0.81 mg/dL (ref 0.61–1.24)
GFR, Estimated: >60 mL/min (ref >60)
Glucose, Bld: 102 mg/dL — ABNORMAL HIGH (ref 70–99)
Potassium: 3.6 mmol/L (ref 3.5–5.1)
Sodium: 138 mmol/L (ref 135–145)
Total Bilirubin: 0.9 mg/dL (ref 0.0–1.2)
Total Protein: 8.3 g/dL — ABNORMAL HIGH (ref 6.5–8.1)

## 2023-11-15 LAB — ETHANOL: Alcohol, Ethyl (B): <15 mg/dL (ref <15)

## 2023-11-15 LAB — CBC
HCT: 39.3 % (ref 39.0–52.0)
Hemoglobin: 13.4 g/dL (ref 13.0–17.0)
MCH: 28.9 pg (ref 26.0–34.0)
MCHC: 34.1 g/dL (ref 30.0–36.0)
MCV: 84.7 fL (ref 80.0–100.0)
Platelets: 387 K/uL (ref 150–400)
RBC: 4.64 MIL/uL (ref 4.22–5.81)
RDW: 13.2 % (ref 11.5–15.5)
WBC: 13.1 K/uL — ABNORMAL HIGH (ref 4.0–10.5)
nRBC: 0 % (ref 0.0–0.2)

## 2023-11-15 LAB — ACETAMINOPHEN LEVEL: Acetaminophen (Tylenol), Serum: <10 ug/mL — ABNORMAL LOW (ref 10–30)

## 2023-11-15 LAB — VALPROIC ACID LEVEL: Valproic Acid Lvl: <10 ug/mL — ABNORMAL LOW (ref 50–100)

## 2023-11-15 LAB — SALICYLATE LEVEL: Salicylate Lvl: <7 mg/dL — ABNORMAL LOW (ref 7.0–30.0)

## 2023-11-15 MED ORDER — HALOPERIDOL 5 MG PO TABS
10.0000 mg | ORAL_TABLET | Freq: Three times a day (TID) | ORAL | Status: DC
  Administered 2023-11-15 (×2): 10 mg via ORAL
  Filled 2023-11-15 (×2): qty 2

## 2023-11-15 MED ORDER — PALIPERIDONE ER 3 MG PO TB24
3.0000 mg | ORAL_TABLET | Freq: Every day | ORAL | Status: DC
  Administered 2023-11-15: 3 mg via ORAL
  Filled 2023-11-15: qty 1

## 2023-11-15 MED ORDER — TRAZODONE HCL 50 MG PO TABS: 50.0000 mg | ORAL_TABLET | Freq: Every evening | ORAL | Status: DC | PRN

## 2023-11-15 MED ORDER — PRAZOSIN HCL 1 MG PO CAPS: 1.0000 mg | ORAL_CAPSULE | Freq: Every day | ORAL | Status: DC

## 2023-11-15 MED ORDER — BENZTROPINE MESYLATE 1 MG PO TABS
0.5000 mg | ORAL_TABLET | Freq: Every day | ORAL | Status: DC
  Administered 2023-11-15: 0.5 mg via ORAL
  Filled 2023-11-15: qty 1

## 2023-11-15 MED ORDER — QUETIAPINE FUMARATE 200 MG PO TABS: 200.0000 mg | ORAL_TABLET | Freq: Every day | ORAL | Status: DC

## 2023-11-15 MED ORDER — DIVALPROEX SODIUM ER 500 MG PO TB24: 1000.0000 mg | ORAL_TABLET | Freq: Every day | ORAL | Status: DC

## 2023-11-15 NOTE — ED Triage Notes (Signed)
 Pt sts I feel really on edge. I dont want to be IVCd. Expressive loud. Ramblings. Pt directable at this time

## 2023-11-15 NOTE — Consult Note (Signed)
 [facility] Psychiatric Consult Follow up  Patient Name: .Kenneth Jensen  MRN: [id number]  DOB: 1990-02-18  Consult Order details:  Orders (From admission, onward)     Start     Ordered   09/28/23 2354  IP CONSULT TO PSYCHIATRY       Ordering Provider: [name], MD  Provider:  (Not yet assigned)  Question:  Reason for consult:  Answer:  Medication management   09/28/23 2353   09/28/23 2354  CONSULT TO CALL ACT TEAM       Ordering Provider: [name], MD  Provider:  (Not yet assigned)  Question:  Reason for Consult?  Answer:  Hallucinations   09/28/23 2353             Mode of Visit: Follow up done in person    Psychiatry Consult Evaluation  Service Date: November 15, 2023 LOS:  LOS: 0 days  Chief Complaint  Paranoia  Primary Psychiatric Diagnoses  Schizophrenia 2.  Bipolar disorder  Assessment  Kenneth Jensen is a 33 y.o. male admitted: Presented to the ED for 11/15/2023  3:54 AM for having a Schizophrenic problem. He has previous psychiatric history of Schizophrenia, Bipolar disorder, and polysubstance abuse. Patient is rambling in triage about his grandpa, being at [facility] [place], and not feeling safe in his own house.   Per ED physician documentation: Kenneth Jensen is a 33 y.o. male with history of bipolar disorder, schizophrenia, asthma who presents to the emergency department with complaints of needing psychiatric evaluation.  Patient is very difficult to understand as he has rapid, pressured speech and tangential thought process.  Patient states that he is having hallucinations of people that are not present.  Patient tells me that his grandfather told him that if your blood is that red then someone is out to get you when getting his blood drawn.  He talks about voodoo.  He states he has been taking his medications.  He denies any drug or alcohol use.  Denies SI or HI.  Diagnoses:  Active Hospital problems: Active Problems:    Paranoia    Plan   ## Psychiatric Medication Recommendations:  -patient may continue home medications.  ## Medical Decision Making Capacity: Not specifically addressed in this encounter   ## Disposition:-- patient may discharge home today to follow up with his outpatient mental health provider.   ## Behavioral / Environmental: - No specific recommendations at this time.     ## Safety and Observation Level:  - Based on my clinical evaluation, I estimate the patient to be at low risk of self harm in the current setting. - At this time, we recommend  routine. This decision is based on my review of the chart including patient's history and current presentation, interview of the patient, mental status examination, and consideration of suicide risk including evaluating suicidal ideation, plan, intent, suicidal or self-harm behaviors, risk factors, and protective factors. This judgment is based on our ability to directly address suicide risk, implement suicide prevention strategies, and develop a safety plan while the patient is in the clinical setting. Please contact our team if there is a concern that risk level has changed.  CSSR Risk Category:C-SSRS RISK CATEGORY: No Risk  Suicide Risk Assessment: Patient has following modifiable risk factors for suicide: triggering events, which we are addressing by overnight observation. Patient has following non-modifiable or demographic risk factors for suicide: male gender Patient has the following protective factors against suicide: Access to outpatient mental health  care  Thank you for this consult request. Recommendations have been communicated to the primary team.  We will not recommend inpatient admission at this time.   Camelia Mountain, NP        History of Present Illness  Relevant Aspects of Hospital ED Course:  Admitted on 11/15/2023 for auditory hallucinations, medication noncompliance, and passive suicidal ideation.  Patient Report:   Kenneth Jensen is a 33 year old male with a past psychiatric history of Schizophrenia, Bipolar disorder, and polysubstance abuse, who presents to the ED voluntarily requesting psychiatric evaluation. Patient reports vague symptoms of feeling unsafe at his apartment and thinking that his cousin is trying to set him up. Patient endorses slightly hearing voices, but denies current suicidal ideation, homicidal ideation, or visual hallucinations.  He denies recent drug use; however, his UDS is positive for cocaine and marijuana. Patient reports that he is connected with the [facility] ACT team. He denies current medical complaints. He states he lost his phone and became scared, which contributed to his decision to seek help. Patient reports wanting to return to inpatient psychiatric care to rest for a few days. Then, a while later informed nursing staff that he desires to discharge to home today.    Psych ROS:  Depression: no Anxiety:  yes Mania (lifetime and current): no Psychosis: (lifetime and current): yes   Review of Systems  Psychiatric/Behavioral:  Positive for substance abuse. Negative for depression, hallucinations and suicidal ideas.      Psychiatric and Social History  Psychiatric History:  Information collected from patient and patient's chart.   Prev Dx/Sx: Schizophrenia Current Psych Provider: [facility] Home Meds (current): unknown Previous Med Trials: unknown Therapy: unknown  Prior Psych Hospitalization: yes  Prior Self Harm: unknown Prior Violence: unknown  Family Psych History: unknown Family Hx suicide: unknown  Social History:  Developmental Hx: unknown Educational Hx: unknown Occupational Hx: unknown Legal Hx: unknown Living Situation: alone Spiritual Hx: unknown Access to weapons/lethal means: Denies  Substance History Alcohol: denies  Tobacco: denies Illicit drugs: denies Prescription drug abuse: denies Rehab hx: unknown  Exam Findings   Vital Signs:  Temp:   [97.6 F (36.4 C)-97.9 F (36.6 C)] 97.9 F (36.6 C) (11/09 1110) Pulse Rate:  [81-86] 86 (11/09 1110) Resp:  [18-20] 18 (11/09 1110) BP: (123-132)/(67-88) 123/88 (11/09 1110) SpO2:  [92 %-99 %] 92 % (11/09 1110) Blood pressure 123/88, pulse 86, temperature 97.9 F (36.6 C), temperature source Oral, resp. rate 18, SpO2 92%. There is no height or weight on file to calculate BMI.  Physical Exam HENT:     Head: Normocephalic.  Pulmonary:     Effort: Pulmonary effort is normal.  Skin:    General: Skin is dry.  Neurological:     Mental Status: He is oriented to person, place, and time.  Psychiatric:        Behavior: Behavior is not agitated or aggressive.        Thought Content: Thought content does not include homicidal or suicidal ideation. Thought content does not include homicidal or suicidal plan.      Other History   These have been pulled in through the EMR, reviewed, and updated if appropriate.   Family History:  The patient's family history is not on file.  Medical History: Past Medical History:  Diagnosis Date   Asthma    Bipolar 1 disorder (HCC)    Depression    Schizophrenia (HCC)    Schizotaxia     Surgical History: No past surgical history on  file.   Medications:   Current Facility-Administered Medications:    benztropine  (COGENTIN ) tablet 0.5 mg, 0.5 mg, Oral, Daily, Ward, Kristen N, DO, 0.5 mg at 11/15/23 1052   divalproex  (DEPAKOTE  ER) 24 hr tablet 1,000 mg, 1,000 mg, Oral, QHS, Ward, Kristen N, DO   haloperidol  (HALDOL ) tablet 10 mg, 10 mg, Oral, TID, Ward, Kristen N, DO, 10 mg at 11/15/23 1052   paliperidone  (INVEGA ) 24 hr tablet 3 mg, 3 mg, Oral, Daily, Ward, Kristen N, DO, 3 mg at 11/15/23 1052   prazosin  (MINIPRESS ) capsule 1 mg, 1 mg, Oral, QHS, Ward, Kristen N, DO   QUEtiapine  (SEROQUEL ) tablet 200 mg, 200 mg, Oral, QHS, Ward, Kristen N, DO   traZODone  (DESYREL ) tablet 50 mg, 50 mg, Oral, QHS PRN, Ward, Josette SAILOR, DO  Current Outpatient  Medications:    benztropine  (COGENTIN ) 0.5 MG tablet, Take 0.5 mg by mouth daily., Disp: , Rfl:    divalproex  (DEPAKOTE  ER) 500 MG 24 hr tablet, Take 2 tablets (1,000 mg total) by mouth at bedtime for 14 days., Disp: 28 tablet, Rfl: 0   nicotine  polacrilex (NICORETTE ) 2 MG gum, Take 1 each (2 mg total) by mouth as needed for smoking cessation., Disp: 100 tablet, Rfl: 0   paliperidone  (INVEGA  SUSTENNA) 234 MG/1.5ML injection, Inject 234 mg into the muscle every 28 (twenty-eight) days., Disp: 1.8 mL, Rfl: 1   paliperidone  (INVEGA ) 9 MG 24 hr tablet, Take 9 mg by mouth daily., Disp: , Rfl:    prazosin  (MINIPRESS ) 1 MG capsule, Take 1 capsule (1 mg total) by mouth at bedtime., Disp: 14 capsule, Rfl: 0   QUEtiapine  (SEROQUEL ) 200 MG tablet, Take 200 mg by mouth at bedtime., Disp: , Rfl:    haloperidol  (HALDOL ) 10 MG tablet, Take 10 mg by mouth 3 (three) times daily. (Patient not taking: Reported on 07/14/2023), Disp: , Rfl:    paliperidone  (INVEGA ) 3 MG 24 hr tablet, Take 3 mg by mouth at bedtime. (Patient not taking: Reported on 11/15/2023), Disp: , Rfl:    traZODone  (DESYREL ) 50 MG tablet, Take 1 tablet (50 mg total) by mouth at bedtime as needed for up to 14 days for sleep., Disp: 14 tablet, Rfl: 0  Allergies: Allergies  Allergen Reactions   Penicillins Anaphylaxis   Amoxicillin Swelling   Percocet [Oxycodone-Acetaminophen ]    Vicodin [Hydrocodone-Acetaminophen ] Itching   Vicodin [Hydrocodone-Acetaminophen ]     Camelia Mountain, NP

## 2023-11-15 NOTE — ED Triage Notes (Signed)
 Pt sts he want help. Sts he is having aSchizophrenic problem. Pt rambling in triage about his grandpa, being at [facility], [place], and not feeling safe in his own house. HX Bipolar and schizophrenia.   Pt sts compliance with medications. No SI/HI.

## 2023-11-15 NOTE — ED Provider Notes (Signed)
 Emergency Medicine Observation Re-evaluation Note  Kenneth Jensen is a 33 y.o. male, seen on rounds today.  Pt initially presented to the ED for complaints of Mental Health Problem  Currently, the patient is calm, no acute complaints.  Physical Exam  Blood pressure 123/88, pulse 86, temperature 97.9 F (36.6 C), temperature source Oral, resp. rate 18, SpO2 92%. Physical Exam General: NAD Lungs: CTAB Psych: not agitated  ED Course / MDM  EKG:    I have reviewed the labs performed to date as well as medications administered while in observation.  Recent changes in the last 24 hours include no acute events overnight.  Cleared for discharge by psychiatry  Plan  Current plan is for discharge. Patient is not under full IVC at this time.   Viviann Pastor, MD 11/15/23 [phone]

## 2023-11-15 NOTE — ED Notes (Signed)
Patient assigned to appropriate care area. Patient oriented to unit/care area: Informed that, for their safety, care areas are designed for safety and monitored by security cameras at all times; and visiting hours explained to patient. Patient verbalizes understanding, and verbal contract for safety obtained. 

## 2023-11-15 NOTE — ED Notes (Signed)
 Dinner tray provided to pt

## 2023-11-15 NOTE — ED Notes (Signed)
 Lunch tray provided to pt.

## 2023-11-15 NOTE — ED Notes (Signed)
 Pt discharged at this time. RN reviewed discharge instructions with pt. Pt verbalized understanding. Vital signs taken. RR even and unlabored. Pt denies any questions or needs at this time. Pt ambulatory at discharge. Pt given bus pass.

## 2023-11-15 NOTE — ED Notes (Signed)
VOL  pending  consult 

## 2023-11-15 NOTE — ED Notes (Signed)
 Pt dressed out into hospital scrubs. Witnessed by this RN and NT [employer], yellow shirt, 15 Cents, phone, two lighters, pair of black socks, pair of black sneakers, and orange/pink book bag.

## 2023-11-15 NOTE — ED Provider Notes (Signed)
 [facility] Provider Note    Event Date/Time   First MD Initiated Contact with Patient 11/15/23 0340     (approximate)   History   Mental Health Problem   HPI  Kenneth Jensen is a 33 y.o. male with history of bipolar disorder, schizophrenia, asthma who presents to the emergency department with complaints of needing psychiatric evaluation.  Patient is very difficult to understand as he has rapid, pressured speech and tangential thought process.  Patient states that he is having hallucinations of people that are not present.  Patient tells me that his grandfather told him that if your blood is that red then someone is out to get you when getting his blood drawn.  He talks about voodoo.  He states he has been taking his medications.  He denies any drug or alcohol use.  Denies SI or HI. History provided by patient.    Past Medical History:  Diagnosis Date   Asthma    Bipolar 1 disorder (HCC)    Depression    Schizophrenia (HCC)    Schizotaxia     No past surgical history on file.  MEDICATIONS:  Prior to Admission medications   Medication Sig Start Date End Date Taking? Authorizing Provider  benztropine  (COGENTIN ) 0.5 MG tablet Take 0.5 mg by mouth daily. Patient not taking: Reported on 07/14/2023 04/02/23   [provider]  divalproex  (DEPAKOTE  ER) 500 MG 24 hr tablet Take 2 tablets (1,000 mg total) by mouth at bedtime for 14 days. 09/09/22 09/29/23  Massengill, Rankin, MD  haloperidol  (HALDOL ) 10 MG tablet Take 10 mg by mouth 3 (three) times daily. Patient not taking: Reported on 07/14/2023 04/27/23   [provider]  nicotine  polacrilex (NICORETTE ) 2 MG gum Take 1 each (2 mg total) by mouth as needed for smoking cessation. Patient not taking: Reported on 07/29/2023 09/09/22   Johny Rankin, MD  paliperidone  (INVEGA  SUSTENNA) 234 MG/1.5ML injection Inject 234 mg into the muscle every 28 (twenty-eight) days. 06/04/21   Clapacs, Norleen DASEN,  MD  paliperidone  (INVEGA ) 3 MG 24 hr tablet Take 3 mg by mouth at bedtime. 12/09/22   [provider]  prazosin  (MINIPRESS ) 1 MG capsule Take 1 capsule (1 mg total) by mouth at bedtime. 09/09/22 09/29/23  Massengill, Rankin, MD  QUEtiapine  (SEROQUEL ) 200 MG tablet Take 200 mg by mouth at bedtime. 07/09/23   [provider]  traZODone  (DESYREL ) 50 MG tablet Take 1 tablet (50 mg total) by mouth at bedtime as needed for up to 14 days for sleep. 09/09/22 07/10/23  Johny Rankin, MD    Physical Exam   Triage Vital Signs: ED Triage Vitals [11/15/23 0335]  Encounter Vitals Group     BP      Girls Systolic BP Percentile      Girls Diastolic BP Percentile      Boys Systolic BP Percentile      Boys Diastolic BP Percentile      Pulse      Resp      Temp      Temp src      SpO2      Weight      Height      Head Circumference      Peak Flow      Pain Score 0     Pain Loc      Pain Education      Exclude from Growth Chart     Most recent vital signs:  Vitals:   11/15/23 0342  BP: 132/67  Pulse: 81  Resp: 20  Temp: 97.6 F (36.4 C)  SpO2: 99%    CONSTITUTIONAL: Alert, responds appropriately to questions. Well-appearing; well-nourished HEAD: Normocephalic, atraumatic EYES: Conjunctivae clear, pupils appear equal, sclera nonicteric ENT: normal nose; moist mucous membranes NECK: Supple, normal ROM CARD: RRR; S1 and S2 appreciated RESP: Normal chest excursion without splinting or tachypnea; breath sounds clear and equal bilaterally; no wheezes, no rhonchi, no rales, no hypoxia or respiratory distress, speaking full sentences ABD/GI: Non-distended; soft, non-tender, no rebound, no guarding, no peritoneal signs BACK: The back appears normal EXT: Normal ROM in all joints; no deformity noted, no edema SKIN: Normal color for age and race; warm; no rash on exposed skin NEURO: Moves all extremities equally, normal speech PSYCH: Rambling speech.  Rapid, pressured speech.   Tangential thought process.  Denies SI or HI.   ED Results / Procedures / Treatments   LABS: (all labs ordered are listed, but only abnormal results are displayed) Labs Reviewed  COMPREHENSIVE METABOLIC PANEL WITH GFR - Abnormal; Notable for the following components:      Result Value   Glucose, Bld 102 (*)    Calcium 8.7 (*)    Total Protein 8.3 (*)    AST 62 (*)    All other components within normal limits  CBC - Abnormal; Notable for the following components:   WBC 13.1 (*)    All other components within normal limits  URINE DRUG SCREEN, QUALITATIVE ([facility] ONLY) - Abnormal; Notable for the following components:   Cocaine Metabolite,Ur [place] POSITIVE (*)    Cannabinoid 50 Ng, Ur [place] POSITIVE (*)    All other components within normal limits  ACETAMINOPHEN  LEVEL - Abnormal; Notable for the following components:   Acetaminophen  (Tylenol ), Serum <10 (*)    All other components within normal limits  SALICYLATE LEVEL - Abnormal; Notable for the following components:   Salicylate Lvl <7.0 (*)    All other components within normal limits  VALPROIC ACID  LEVEL - Abnormal; Notable for the following components:   Valproic Acid  Lvl <10 (*)    All other components within normal limits  ETHANOL     EKG:  RADIOLOGY: My personal review and interpretation of imaging:    I have personally reviewed all radiology reports.   No results found.   PROCEDURES:  Critical Care performed: No    Procedures    IMPRESSION / MDM / ASSESSMENT AND PLAN / ED COURSE  I reviewed the triage vital signs and the nursing notes.    Patient here with acute psychoses, tangential thought process.     DIFFERENTIAL DIAGNOSIS (includes but not limited to):   Decompensated schizophrenia, substance use disorder, psychosis, depression, anxiety, malingering   Patient's presentation is most consistent with acute presentation with potential threat to life or bodily function.   PLAN: Will obtain screening  labs and urine.  Will consult psychiatry and TTS.  Patient voluntary at this time.  The patient has been placed in psychiatric observation due to the need to provide a safe environment for the patient while obtaining psychiatric consultation and evaluation, as well as ongoing medical and medication management to treat the patient's condition.  The patient has not been placed under full IVC at this time.    MEDICATIONS GIVEN IN ED: Medications - No data to display   ED COURSE: Labs show normal electrolytes, hemoglobin.  Negative Tylenol  level, salicylate level, ethanol level.  Drug screen positive for  cocaine, cannabinoids.  He is medically cleared at this time for psychiatric disposition.   CONSULTS: Psychiatry and TTS consulted for further disposition.   OUTSIDE RECORDS REVIEWED: Reviewed previous psychiatric notes.       FINAL CLINICAL IMPRESSION(S) / ED DIAGNOSES   Final diagnoses:  Psychosis, unspecified psychosis type (HCC)     Rx / DC Orders   ED Discharge Orders     None        Note:  This document was prepared using Dragon voice recognition software and may include unintentional dictation errors.   Jeniffer Culliver, Josette SAILOR, DO 11/15/23 [phone]

## 2023-11-15 NOTE — BH Assessment (Signed)
 Comprehensive Clinical Assessment (CCA) Screening, Triage and Referral Note  11/15/2023 Kenneth Jensen [id number]  Chief Complaint:  Chief Complaint  Patient presents with   Mental Health Problem   Visit Diagnosis: Schizophrenia   Patient Reported Information How did you hear about us ? Self  What Is the Reason for Your Visit/Call Today? Because I didn't feel safe in my apartment  How Long Has This Been Causing You Problems? > than 6 months  What Do You Feel Would Help You the Most Today? Alcohol or Drug Use Treatment   Have You Recently Had Any Thoughts About Hurting Yourself? No  Are You Planning to Commit Suicide/Harm Yourself At This time? No   Have you Recently Had Thoughts About Hurting Someone [name]? No  Are You Planning to Harm Someone at This Time? No  Explanation: Pt endorsed having vague HI without a specific plan.   Have You Used Any Alcohol or Drugs in the Past 24 Hours? Yes  How Long Ago Did You Use Drugs or Alcohol? UKN  What Did You Use and How Much? UDS positive for cocaine and THC   Do You Currently Have a Therapist/Psychiatrist? Yes  Name of Therapist/Psychiatrist: [facility]   Have You Been Recently Discharged From Any Office Practice or Programs? No  Explanation of Discharge From Practice/Program: n/a    CCA Screening Triage Referral Assessment Type of Contact: Face-to-Face  Telemedicine Service Delivery:   Is this Initial or Reassessment?   Date Telepsych consult ordered in [facility]:    Time Telepsych consult ordered in [facility]:    Location of Assessment: [facility] ED  Provider Location: [facility] ED    Collateral Involvement: [name] - Guardian on call for [employer] - [phone]   Does Patient Have a Court Appointed Legal Guardian? No data recorded Name and Contact of Legal Guardian: No data recorded If Minor and Not Living with Parent(s), Who has Custody? n/a  Is CPS involved or ever been involved? Never  Is APS involved or  ever been involved? Never   Patient Determined To Be At Risk for Harm To Self or Others Based on Review of Patient Reported Information or Presenting Complaint? No  Method: No Plan  Availability of Means: No access or NA  Intent: Vague intent or NA  Notification Required: No need or identified person  Additional Information for Danger to Others Potential: Active psychosis  Additional Comments for Danger to Others Potential: n/a  Are There Guns or Other Weapons in Your Home? No  Types of Guns/Weapons: No access  Are These Weapons Safely Secured?                            -- (n/a)  Who Could Verify You Are Able To Have These Secured: n/a  Do You Have any Outstanding Charges, Pending Court Dates, Parole/Probation? None reported  Contacted To Inform of Risk of Harm To Self or Others: -- (n/a)   Does Patient Present under Involuntary Commitment? No    [place] of Residence: [place]   Patient Currently Receiving the Following Services: ACTT Psychologist, Educational)   Determination of Need: Emergent (2 hours)   Options For Referral: ED Visit   Disposition Recommendation per psychiatric provider: There are no psychiatric contraindications to discharge at this time  MAGDALEN LITTIE Mages, Counselor

## 2023-11-29 ENCOUNTER — Emergency Department
Admission: EM | Admit: 2023-11-29 | Discharge: 2023-11-29 | Disposition: A | Discharge status: Home / Self Care | Payer: Medicare (Managed Care) | Attending: Emergency Medicine | Admitting: Emergency Medicine

## 2023-11-29 DIAGNOSIS — Z79899 Other long term (current) drug therapy: Secondary | ICD-10-CM | POA: Insufficient documentation

## 2023-11-29 DIAGNOSIS — F1410 Cocaine abuse, uncomplicated: Secondary | ICD-10-CM

## 2023-11-29 DIAGNOSIS — F209 Schizophrenia, unspecified: Secondary | ICD-10-CM | POA: Diagnosis not present

## 2023-11-29 DIAGNOSIS — R079 Chest pain, unspecified: Secondary | ICD-10-CM | POA: Diagnosis not present

## 2023-11-29 DIAGNOSIS — F1910 Other psychoactive substance abuse, uncomplicated: Secondary | ICD-10-CM

## 2023-11-29 LAB — CBC WITH DIFFERENTIAL/PLATELET
Abs Immature Granulocytes: 0.02 K/uL (ref 0.00–0.07)
Basophils Absolute: 0 K/uL (ref 0.0–0.1)
Basophils Relative: 0 %
Eosinophils Absolute: 0.3 K/uL (ref 0.0–0.5)
Eosinophils Relative: 4 %
HCT: 39.6 % (ref 39.0–52.0)
Hemoglobin: 13.3 g/dL (ref 13.0–17.0)
Immature Granulocytes: 0 %
Lymphocytes Relative: 47 %
Lymphs Abs: 4.2 K/uL — ABNORMAL HIGH (ref 0.7–4.0)
MCH: 28.8 pg (ref 26.0–34.0)
MCHC: 33.6 g/dL (ref 30.0–36.0)
MCV: 85.7 fL (ref 80.0–100.0)
Monocytes Absolute: 0.8 K/uL (ref 0.1–1.0)
Monocytes Relative: 8 %
Neutro Abs: 3.7 K/uL (ref 1.7–7.7)
Neutrophils Relative %: 41 %
Platelets: 324 K/uL (ref 150–400)
RBC: 4.62 MIL/uL (ref 4.22–5.81)
RDW: 13.9 % (ref 11.5–15.5)
WBC: 9 K/uL (ref 4.0–10.5)
nRBC: 0 % (ref 0.0–0.2)

## 2023-11-29 LAB — COMPREHENSIVE METABOLIC PANEL WITH GFR
ALT: 11 U/L (ref 0–44)
AST: 17 U/L (ref 15–41)
Albumin: 4.2 g/dL (ref 3.5–5.0)
Alkaline Phosphatase: 74 U/L (ref 38–126)
Anion gap: 9 (ref 5–15)
BUN: 9 mg/dL (ref 6–20)
CO2: 27 mmol/L (ref 22–32)
Calcium: 8.6 mg/dL — ABNORMAL LOW (ref 8.9–10.3)
Chloride: 102 mmol/L (ref 98–111)
Creatinine, Ser: 0.81 mg/dL (ref 0.61–1.24)
GFR, Estimated: >60 mL/min (ref >60)
Glucose, Bld: 108 mg/dL — ABNORMAL HIGH (ref 70–99)
Potassium: 3.7 mmol/L (ref 3.5–5.1)
Sodium: 137 mmol/L (ref 135–145)
Total Bilirubin: 0.3 mg/dL (ref 0.0–1.2)
Total Protein: 6.8 g/dL (ref 6.5–8.1)

## 2023-11-29 LAB — ETHANOL: Alcohol, Ethyl (B): <15 mg/dL (ref <15)

## 2023-11-29 LAB — TROPONIN T, HIGH SENSITIVITY: Troponin T High Sensitivity: <15 ng/L (ref 0–19)

## 2023-11-29 LAB — LIPASE, BLOOD: Lipase: 30 U/L (ref 11–51)

## 2023-11-29 NOTE — ED Provider Notes (Addendum)
 Emergency department handoff note  Care of this patient was signed out to me at the end of the previous provider shift.  All pertinent patient information was conveyed and all questions were answered.  Patient pending laboratory evaluation including troponin that was negative.  Patient is EKG shows no signs of ischemia.  Is now requesting social work due to housing instability.  After patient was seen by social work, patient now is complaining of of increasing hallucinations and requesting psychiatric evaluation.  Patient has been evaluated multiple times by psychiatry who cleared him for discharge.  Patient will remain in our emergency department until psychiatric evaluation  Dispo: Pending   Jossie Artist POUR, MD 11/29/23 9167    Jossie Artist POUR, MD 11/29/23 [phone]

## 2023-11-29 NOTE — ED Notes (Signed)
 Pt woken by this RN, informed of lab results and plan to DC home. Pt then telling this RN he is also here for placement. Pt states everyone expects me to do all these things for them but then not give me any respect. I want a new place and I'll stay in the hospital for as long as it takes.  Dr Jossie informed.

## 2023-11-29 NOTE — ED Triage Notes (Signed)
 Pt here via EMS for chest pain he believes is related to using cocaine yesterday. Pt has complex mental health hx, denies missing recent doses. Denies N/V/D/fever,SOB. Pt appears comfortable. No meds PTA.

## 2023-11-29 NOTE — ED Notes (Signed)
 Pt informed he has a LG and that is who he needs to reach out to to change his living situation per TOC. Pt informed his name and number along with substance abuse info has been added to his DC papers. Pt then informed this RN would reach out to LG and [facility] to see about a ride home.

## 2023-11-29 NOTE — ED Notes (Signed)
 Pt informed TTS would stop by to speak with pt and see how they can help. Pt asking for pillow, pillow provided.

## 2023-11-29 NOTE — ED Notes (Signed)
 Lab called to confirmed pt still on list for pending labs. Per technician, to send next available phlebotomist.

## 2023-11-29 NOTE — Discharge Instructions (Addendum)
 For housing/placement concerns, please follow-up with your legal guardian, [name].   [employer], Guardianship Services  [place] (Legal Guardian) [phone] (Mobile)  Crisis Line:  [phone] (24/7/365)  [email or web address] (Email)

## 2023-11-29 NOTE — ED Notes (Addendum)
 LG, [facility] [employer] called and informed of pt staying for psych eval. They stated they would update the forsyth team of pt status. Informed them I would call once pt has been psych cleared and ready for DC again.

## 2023-11-29 NOTE — ED Notes (Signed)
 [facility] contacted and informed of pt DC home. Stated they would inform Mr Myrna and have him follow up with pt.

## 2023-11-29 NOTE — ED Notes (Signed)
 Pt informed of taxi voucher, pt then stating why everytime I come here you guys send me away. Pt informed of lab results and Dr assessment, pt then stating nobody got blood from me, nobody woke me up for it. Pt informed a phlebotomist came this morning and obtained blood, blood has resulted. Pt then stating no nobody got blood, and I still need help with my schizophrenia. My momma sent me here to get help and nobody is helping. I'm not going home, I need to stay here. Dr Jossie informed of pt wanting to see psych and refusal of DC at this time.

## 2023-11-29 NOTE — TOC Initial Note (Signed)
 Transition of Care [facility]) - Initial/Assessment Note    Patient Details  Name: Kenneth Jensen MRN: [id number] Date of Birth: [birth year]  Transition of Care [facility]) CM/SW Contact:    Kenneth Montemayor L Kimo Bancroft, LCSW Phone Number: 11/29/2023, 8:50 AM  Clinical Narrative:                  [facility] consult received for substance abuse education/counseling. TOC does not provide counseling. Inpatient and Outpatient substance abuse resources have been added to the AVS.   The consult also advised that patient wants new housing. Patient has a legal guardian, Kenneth Jensen, who patient can follow-up with regarding housing concerns. Kenneth Jensen contact information will be added to the AVS.        Patient Goals and CMS Choice            Expected Discharge Plan and Services                                              Prior Living Arrangements/Services                       Activities of Daily Living      Permission Sought/Granted                  Emotional Assessment              Admission diagnosis:  Psychiatric Evaluation Patient Active Problem List   Diagnosis Date Noted   Suicidal ideations 04/16/2023   Suicidal ideation 01/11/2023   Acute psychosis (HCC) 11/14/2022   Cocaine use disorder, severe, dependence (HCC) 09/04/2022   Vitamin D  deficiency, unspecified 06/08/2022   Mixed hyperlipidemia 06/08/2022   Cocaine abuse (HCC) 11/22/2021   Cannabis use disorder    Aggressive behavior    Prediabetes 05/16/2020   Auditory hallucinations    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 [place] [address] Phone: [phone] Fax: [phone]     Social  Drivers of Health (SDOH) Social History: SDOH Screenings   Food Insecurity: Food Insecurity Present (09/04/2022)  Housing: High Risk (09/04/2022)  Transportation Needs: Unmet Transportation Needs (09/04/2022)  Utilities: Not At Risk (09/04/2022)  Alcohol Screen: Low Risk  (09/03/2022)  Financial Resource Strain: Unknown (01/30/2018)   Received from [facility]  Physical Activity: Unknown (01/30/2018)   Received from [facility]  Social Connections: Unknown (01/30/2018)   Received from [facility]  Stress: Unknown (01/30/2018)   Received from [facility]  Tobacco Use: High Risk (11/29/2023)   SDOH Interventions:     Readmission Risk Interventions     No data to display

## 2023-11-29 NOTE — ED Notes (Signed)
 Pt provided with lunch tray. Pt currently sitting up in bed eating.

## 2023-11-29 NOTE — ED Provider Notes (Signed)
 [facility] Provider Note    Event Date/Time   First MD Initiated Contact with Patient 11/29/23 0405     (approximate)   History   Psychiatric Evaluation and Chest Pain   HPI Kenneth Jensen is a 33 y.o. male well-known to the emergency department for numerous psychiatric visits related to psychiatric illness including substance abuse, specifically cocaine.  He presents tonight by EMS for evaluation of chest pain.  He said that he thinks he heard his heart from doing cocaine.  He does not have any thoughts of harming himself or anyone else.  He was worried about his chest pain.  Then he went to sleep.     Physical Exam   Triage Vital Signs: ED Triage Vitals  Encounter Vitals Group     BP 11/29/23 0337 124/87     Girls Systolic BP Percentile --      Girls Diastolic BP Percentile --      Boys Systolic BP Percentile --      Boys Diastolic BP Percentile --      Pulse Rate 11/29/23 0337 79     Resp 11/29/23 0337 17     Temp 11/29/23 0351 98.1 F (36.7 C)     Temp Source 11/29/23 0351 Oral     SpO2 11/29/23 0337 100 %     Weight 11/29/23 0339 90.7 kg (200 lb)     Height 11/29/23 0339 1.626 m (5' 4)     Head Circumference --      Peak Flow --      Pain Score 11/29/23 0337 0     Pain Loc --      Pain Education --      Exclude from Growth Chart --     Most recent vital signs: Vitals:   11/29/23 0337 11/29/23 0351  BP: 124/87   Pulse: 79   Resp: 17   Temp:  98.1 F (36.7 C)  SpO2: 100%     General: Awake, no distress.  CV:  Good peripheral perfusion.  Regular rate and rhythm. Resp:  Normal effort. Speaking easily and comfortably, no accessory muscle usage nor intercostal retractions.   Abd:  No distention.  Other:  Patient is not exhibiting bizarre behavior and is not being violent nor aggressive.  He has flight of ideas and tangential thoughts at baseline which she is also exhibiting tonight, but he is not exhibiting any signs to  suggest he needs to be placed under involuntary commitment.   ED Results / Procedures / Treatments   Labs (all labs ordered are listed, but only abnormal results are displayed) Labs Reviewed  CBC WITH DIFFERENTIAL/PLATELET  COMPREHENSIVE METABOLIC PANEL WITH GFR  LIPASE, BLOOD  ETHANOL  TROPONIN T, HIGH SENSITIVITY  TROPONIN T, HIGH SENSITIVITY     EKG  ED ECG REPORT I, Darleene Dome, the attending physician, personally viewed and interpreted this ECG.  Date: 11/29/2023 EKG Time: 3:42 AM Rate: 73 Rhythm: normal sinus rhythm with sinus arrhythmia QRS Axis: Right axis deviation Intervals: normal ST/T Wave abnormalities: Non-specific ST segment / T-wave changes, but no clear evidence of acute ischemia. Narrative Interpretation: no definitive evidence of acute ischemia; does not meet STEMI criteria.    PROCEDURES:  Critical Care performed: No  Procedures    IMPRESSION / MDM / ASSESSMENT AND PLAN / ED COURSE  I reviewed the triage vital signs and the nursing notes.  Differential diagnosis includes, but is not limited to, Prinzmetal angina, ACS, malingering or secondary gain.  Patient's presentation is most consistent with acute presentation with potential threat to life or bodily function.  Labs/studies ordered: Ethanol level, high-sensitivity troponin, CBC with differential, lipase, CMP, EKG  Interventions/Medications given:  Medications - No data to display  (Note:  hospital course my include additional interventions and/or labs/studies not listed above.)   Vital signs are normal, physical exam is reassuring, no ischemia on EKG.  Patient is well-known to me and the entire emergency department.  Difficulty obtaining blood work, but if his labs are reassuring including just the initial high sensitive troponin, I believe that he can be discharged.  At this point he has no psychiatric needs or concerns and he does not meet criteria for  involuntary commitment nor inpatient treatment.  He was just evaluated recently by psychiatry and can be followed up as an outpatient.         FINAL CLINICAL IMPRESSION(S) / ED DIAGNOSES   Final diagnoses:  Substance abuse (HCC)  Chest pain, unspecified type     Rx / DC Orders   ED Discharge Orders     None        Note:  This document was prepared using Dragon voice recognition software and may include unintentional dictation errors.   Gordan Huxley, MD 11/29/23 [phone]

## 2023-11-29 NOTE — ED Notes (Signed)
 Attempted lab stick unable to obtain. Previously attempted by Triage RN and NT without success. Called Lab for lab draw

## 2023-11-29 NOTE — ED Notes (Signed)
 Attempted to call LG, [place], HIPAA compliant VM left for him to call back.

## 2023-11-29 NOTE — ED Notes (Signed)
 EDP informed on delay on lab collection d/t difficult stick. Pending lab to come collect labs.

## 2023-11-29 NOTE — ED Notes (Signed)
 Spoke with [facility] about getting pt DC back home. They state pt lives alone in an apartment, pt could be DC with bus or taxi.

## 2023-11-29 NOTE — Consult Note (Signed)
 [facility] Psychiatric Consult Initial  Patient Name: .Kenneth Jensen  MRN: [id number]  DOB: 1990/02/19  Consult Order details:  Orders (From admission, onward)     Start     Ordered   11/29/23 1019  IP CONSULT TO PSYCHIATRY       Ordering Provider: Jossie Artist POUR, MD  Provider:  (Not yet assigned)  Question Answer Comment  Consult Timeframe ROUTINE - requires response within 24 hours   Reason for Consult? Consult for medication management   Contact phone number where the requesting provider can be reached [phone]      11/29/23 1018             Mode of Visit: In person    Psychiatry Consult Evaluation  Service Date: November 29, 2023 LOS:  LOS: 0 days  Chief Complaint Psychiatric Evaluation  Primary Psychiatric Diagnoses  Schizophrenia Cocaine Abuse   Assessment  EDP Note: Kenneth Jensen is a 33 y.o. male well-known to the emergency department for numerous psychiatric visits related to psychiatric illness including substance abuse, specifically cocaine.  He presents tonight by EMS for evaluation of chest pain.  He said that he thinks he heard his heart from doing cocaine.  He does not have any thoughts of harming himself or anyone else.  He was worried about his chest pain.  Then he went to sleep.   Consult Note: Kenneth Jensen is a 33 y.o. male admitted: Presented to the [facility] 11/29/2023  4:04 AM for psychiatric evaluation and chest pain. He carries the psychiatric diagnoses of schizophrenia and has no past medical history. Patient presented to the ED with complaints of chest pain around 0400 and was ultimately medically cleared. He was discharged and stated that he needed a psychiatric evaluation. Psychiatry consulted and review of EDP note, patient denied any worsening psychotic symptoms above his baseline, suicidal thoughts, homicidal thoughts, was not responding to internal stimuli, or displaying any bizarre behaviors. Patient was lying in bed resting  in no acute distress. Explains that he wanted to move to another department. Further stated that someone was living in his home that was causing him stress and he did not want that person to continue to be in his home. Explains that chest pain was originating from the stress from that situation. He reports that he is compliant with his medications, denies suicidal or homicidal ideation and did not appear to be responding to internal stimuli. He engaged in his assessment well and was alert and oriented x4. ACT Team is scheduled to see him on Monday and he does not meet criteria for inpatient admission at this time.     Diagnoses:  Active Hospital problems: Active Problems:   * No active hospital problems. *    Plan   ## Psychiatric Medication Recommendations:  Continue home medications  ## Medical Decision Making Capacity: Patient has a guardian and has thus been adjudicated incompetent; please involve patients guardian in medical decision making  ## Further Work-up:   -- most recent EKG on 11/29/2023 had QtC of 436 -- Pertinent labwork reviewed earlier this admission includes: CBC, CMP unremarkable. UDS positive for cocaine and THC   ## Disposition:-- There are no psychiatric contraindications to discharge at this time  ## Behavioral / Environmental: -To minimize splitting of staff, assign one staff person to communicate all information from the team when feasible.    ## Safety and Observation Level:  - Based on my clinical evaluation, I estimate the patient to be  at LOW risk of self harm in the current setting. - At this time, we recommend  routine. This decision is based on my review of the chart including patient's history and current presentation, interview of the patient, mental status examination, and consideration of suicide risk including evaluating suicidal ideation, plan, intent, suicidal or self-harm behaviors, risk factors, and protective factors. This judgment is based on  our ability to directly address suicide risk, implement suicide prevention strategies, and develop a safety plan while the patient is in the clinical setting. Please contact our team if there is a concern that risk level has changed.  CSSR Risk Category:C-SSRS RISK CATEGORY: No Risk  Suicide Risk Assessment: Patient has following modifiable risk factors for suicide: triggering events, which we are addressing by encouraging outpatient compliance. Patient has following non-modifiable or demographic risk factors for suicide: male gender and psychiatric hospitalization Patient has the following protective factors against suicide: Supportive family  Thank you for this consult request. Recommendations have been communicated to the primary team.  We will recommend discharge at this time, stable and clear from a psychiatric standpoint at this time.   [name], NP       History of Present Illness  Relevant Aspects of Hospital ED   Patient Report:  Kenneth Jensen is a 33 y.o. male admitted: Presented to the [facility] 11/29/2023  4:04 AM for psychiatric evaluation and chest pain. He carries the psychiatric diagnoses of schizophrenia and has no past medical history. Patient presented to the ED with complaints of chest pain around 0400 and was ultimately medically cleared. He was discharged and stated that he needed a psychiatric evaluation. Psychiatry consulted and review of EDP note, patient denied any worsening psychotic symptoms above his baseline, suicidal thoughts, homicidal thoughts, was not responding to internal stimuli, or displaying any bizarre behaviors. Patient was lying in bed resting in no acute distress. Explains that he wanted to move to another department. Further stated that someone was living in his home that was causing him stress and he did not want that person to continue to be in his home. Explains that chest pain was originating from the stress from that situation. He reports  that he is compliant with his medications, denies suicidal or homicidal ideation and did not appear to be responding to internal stimuli. He engaged in his assessment well and was alert and oriented x4. ACT Team is scheduled to see him on Monday and he does not meet criteria for inpatient admission at this time.   Psych ROS:  Depression: denies Anxiety:  denies Mania (lifetime and current): denies Psychosis: (lifetime and current): denies  Collateral information:  None    Psychiatric and Social History  Psychiatric History:  Information collected from patient and chart  Prev Dx/Sx: schizophrenia, cocaine abuse Current Psych Provider: ACT Team Home Meds (current): see above Previous Med Trials: unknown Therapy: ACT Team  Prior Psych Hospitalization: multiple  Prior Self Harm: remote hx Prior Violence: denies  Family Psych History: denies Family Hx suicide: denies  Social History:   Educational Hx: HS 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 Hx: denies Living Situation: has housing Spiritual Hx: christian Access to weapons/lethal means: denies   Substance History Alcohol: denies  Type of alcohol denies  Last Drink denies  Number of drinks per day denies  History of alcohol withdrawal seizures denies  History of DT's denies  Tobacco: cigarettes Illicit drugs: cocaine and THC Prescription drug abuse: denies Rehab hx: denies  Exam Findings  Physical  Exam: Reviewed and agree with the physical exam findings conducted by the medical provider Vital Signs:  Temp:  [98.1 F (36.7 C)-98.2 F (36.8 C)] 98.2 F (36.8 C) (11/23 1107) Pulse Rate:  [64-79] 64 (11/23 1107) Resp:  [16-17] 16 (11/23 1107) BP: (113-124)/(62-87) 113/62 (11/23 1107) SpO2:  [99 %-100 %] 99 % (11/23 1107) Weight:  [90.7 kg] 90.7 kg (11/23 0339) Blood pressure 113/62, pulse 64, temperature 98.2 F (36.8 C), temperature source Oral, resp. rate 16, height 5' 4 (1.626 m), weight 90.7 kg, SpO2 99%. Body  mass index is 34.33 kg/m.    Mental Status Exam: General Appearance: Fairly Groomed  Orientation:  Full (Time, Place, and Person)  Memory:  Immediate;   Good Recent;   Good Remote;   Good  Concentration:  Concentration: Good and Attention Span: Good  Recall:  Good  Attention  Good  Eye Contact:  Good  Speech:  Normal Rate  Language:  Good  Volume:  Normal  Mood: euthymic  Affect:  Appropriate  Thought Process:  Coherent  Thought Content:  Logical  Suicidal Thoughts:  No  Homicidal Thoughts:  No  Judgement:  Fair  Insight:  Fair  Psychomotor Activity:  Normal  Akathisia:  No  Fund of Knowledge:  Good      Assets:  Housing  Cognition:  WNL  ADL's:  Intact  AIMS (if indicated):        Other History   These have been pulled in through the EMR, reviewed, and updated if appropriate.  Family History:  The patient's family history is not on file.  Medical History: Past Medical History:  Diagnosis Date   Asthma    Bipolar 1 disorder (HCC)    Depression    Schizophrenia (HCC)    Schizotaxia     Surgical History: History reviewed. No pertinent surgical history.   Medications:  No current facility-administered medications for this encounter.  Current Outpatient Medications:    benztropine  (COGENTIN ) 0.5 MG tablet, Take 0.5 mg by mouth daily., Disp: , Rfl:    divalproex  (DEPAKOTE  ER) 500 MG 24 hr tablet, Take 2 tablets (1,000 mg total) by mouth at bedtime for 14 days., Disp: 28 tablet, Rfl: 0   haloperidol  (HALDOL ) 10 MG tablet, Take 10 mg by mouth 3 (three) times daily. (Patient not taking: Reported on 07/14/2023), Disp: , Rfl:    nicotine  polacrilex (NICORETTE ) 2 MG gum, Take 1 each (2 mg total) by mouth as needed for smoking cessation., Disp: 100 tablet, Rfl: 0   paliperidone  (INVEGA  SUSTENNA) 234 MG/1.5ML injection, Inject 234 mg into the muscle every 28 (twenty-eight) days., Disp: 1.8 mL, Rfl: 1   paliperidone  (INVEGA ) 3 MG 24 hr tablet, Take 3 mg by mouth at  bedtime. (Patient not taking: Reported on 11/15/2023), Disp: , Rfl:    paliperidone  (INVEGA ) 9 MG 24 hr tablet, Take 9 mg by mouth daily., Disp: , Rfl:    prazosin  (MINIPRESS ) 1 MG capsule, Take 1 capsule (1 mg total) by mouth at bedtime., Disp: 14 capsule, Rfl: 0   QUEtiapine  (SEROQUEL ) 200 MG tablet, Take 200 mg by mouth at bedtime., Disp: , Rfl:    traZODone  (DESYREL ) 50 MG tablet, Take 1 tablet (50 mg total) by mouth at bedtime as needed for up to 14 days for sleep., Disp: 14 tablet, Rfl: 0  Allergies: Allergies  Allergen Reactions   Penicillins Anaphylaxis   Amoxicillin Swelling   Percocet [Oxycodone-Acetaminophen ]    Vicodin [Hydrocodone-Acetaminophen ] Itching   Vicodin [Hydrocodone-Acetaminophen ]  Leta Bucklin B Emelee Rodocker, NP

## 2023-11-29 NOTE — ED Notes (Signed)
 Pt informed of contact with [facility]. Asked for a number for RJ as no number listed in chart. Pt does not know the number and states his cousin will not pick him up and uncle listed lives in [place] and probably wont come. Will speak with CRN to see how to get pt home safely.

## 2023-11-30 ENCOUNTER — Other Ambulatory Visit: Payer: Self-pay

## 2023-11-30 ENCOUNTER — Encounter: Payer: Self-pay | Admitting: *Deleted

## 2023-11-30 ENCOUNTER — Emergency Department: Admission: EM | Admit: 2023-11-30 | Discharge: 2023-12-01 | Disposition: A | Payer: Medicare (Managed Care)

## 2023-11-30 DIAGNOSIS — J45909 Unspecified asthma, uncomplicated: Secondary | ICD-10-CM | POA: Insufficient documentation

## 2023-11-30 DIAGNOSIS — F29 Unspecified psychosis not due to a substance or known physiological condition: Secondary | ICD-10-CM | POA: Diagnosis not present

## 2023-11-30 DIAGNOSIS — R45851 Suicidal ideations: Secondary | ICD-10-CM | POA: Diagnosis not present

## 2023-11-30 DIAGNOSIS — F1410 Cocaine abuse, uncomplicated: Secondary | ICD-10-CM | POA: Insufficient documentation

## 2023-11-30 DIAGNOSIS — F209 Schizophrenia, unspecified: Secondary | ICD-10-CM | POA: Diagnosis present

## 2023-11-30 LAB — COMPREHENSIVE METABOLIC PANEL WITH GFR
ALT: 14 U/L (ref 0–44)
AST: 23 U/L (ref 15–41)
Albumin: 4.7 g/dL (ref 3.5–5.0)
Alkaline Phosphatase: 71 U/L (ref 38–126)
Anion gap: 16 — ABNORMAL HIGH (ref 5–15)
BUN: 7 mg/dL (ref 6–20)
CO2: 22 mmol/L (ref 22–32)
Calcium: 9.2 mg/dL (ref 8.9–10.3)
Chloride: 100 mmol/L (ref 98–111)
Creatinine, Ser: 0.79 mg/dL (ref 0.61–1.24)
GFR, Estimated: >60 mL/min (ref >60)
Glucose, Bld: 94 mg/dL (ref 70–99)
Potassium: 4.1 mmol/L (ref 3.5–5.1)
Sodium: 138 mmol/L (ref 135–145)
Total Bilirubin: 0.5 mg/dL (ref 0.0–1.2)
Total Protein: 7.7 g/dL (ref 6.5–8.1)

## 2023-11-30 LAB — ETHANOL: Alcohol, Ethyl (B): <15 mg/dL (ref <15)

## 2023-11-30 LAB — URINE DRUG SCREEN
Amphetamines: NEGATIVE
Barbiturates: NEGATIVE
Benzodiazepines: NEGATIVE
Cocaine: POSITIVE — AB
Fentanyl: NEGATIVE
Methadone Scn, Ur: NEGATIVE
Opiates: NEGATIVE
Tetrahydrocannabinol: POSITIVE — AB

## 2023-11-30 LAB — CBC
HCT: 45.2 % (ref 39.0–52.0)
Hemoglobin: 14.8 g/dL (ref 13.0–17.0)
MCH: 28.5 pg (ref 26.0–34.0)
MCHC: 32.7 g/dL (ref 30.0–36.0)
MCV: 87.1 fL (ref 80.0–100.0)
Platelets: 329 K/uL (ref 150–400)
RBC: 5.19 MIL/uL (ref 4.22–5.81)
RDW: 13.8 % (ref 11.5–15.5)
WBC: 9.1 K/uL (ref 4.0–10.5)
nRBC: 0 % (ref 0.0–0.2)

## 2023-11-30 MED ORDER — OLANZAPINE 5 MG PO TABS
5.0000 mg | ORAL_TABLET | Freq: Once | ORAL | Status: AC
  Administered 2023-11-30: 5 mg via ORAL
  Filled 2023-11-30: qty 1

## 2023-11-30 NOTE — ED Notes (Signed)
 Pt dressed out into burgundy scrubs with this tech, Perry, EDT, Amy, RN and [employer] in the rm. Pt belongings consist of: tennis shoes, one cell phone with a shattered screen that was placed inside pt tennis shoe, socks, pants, boxers, one wallet, and one jacket. Pt threw his shirt away while in triage. Pt belongings placed into one pt belongings bag and labeled with pt name.

## 2023-11-30 NOTE — BH Assessment (Signed)
 Comprehensive Clinical Assessment (CCA) Note  11/30/2023 Kenneth Jensen [id number]  Chief Complaint: Patient is a 34 year old male presenting to [facility] ED under IVC. Per triage note Pt brought in by bpd. Pt is IVC. Pt has hx of schizophrenia and is off his meds. Pt reports drug and etoh use. Pt agitated in triage. During assessment patient appears alert and oriented x1, patient is agitated, disorganized and labile as soon as the clinical research associate entered the room patient began yelling how they gonna give me this food in their hand! That's nasty. When attempting to ask questions to the patient he would not respond only reports if yall need to keep me go ahead, my family treats me like shit! Per IVC paperwork patient believed that people were outside his home and had stated that he wanted to kill himself as well as the police. Current UDS shows that patient is positive for Cocaine and Tetrahydrocannabinol. Unclear at this time if patient continues to experience SI/HI/AH/VH. Chief Complaint  Patient presents with   Psychiatric Evaluation   Visit Diagnosis: Schizophrenia. Cocaine abuse    CCA Screening, Triage and Referral (STR)  Patient Reported Information How did you hear about us ? Legal System  Referral name: No data recorded Referral phone number: No data recorded  Whom do you see for routine medical problems? No data recorded Practice/Facility Name: No data recorded Practice/Facility Phone Number: No data recorded Name of Contact: No data recorded Contact Number: No data recorded Contact Fax Number: No data recorded Prescriber Name: No data recorded Prescriber Address (if known): No data recorded  What Is the Reason for Your Visit/Call Today? Pt brought in by bpd.  Pt is IVC.  Pt has hx of schizophrenia and is off his meds.  Pt reports drug and etoh use.  Pt agitated in triage.  How Long Has This Been Causing You Problems? > than 6 months  What Do You Feel Would Help You the Most  Today? Alcohol or Drug Use Treatment   Have You Recently Been in Any Inpatient Treatment (Hospital/Detox/Crisis Center/28-Day Program)? No data recorded Name/Location of Program/Hospital:No data recorded How Long Were You There? No data recorded When Were You Discharged? No data recorded  Have You Ever Received Services From [facility] Before? No data recorded Who Do You See at [facility]? No data recorded  Have You Recently Had Any Thoughts About Hurting Yourself? -- (UTA)  Are You Planning to Commit Suicide/Harm Yourself At This time? -- (UTA)   Have you Recently Had Thoughts About Hurting Someone Else? -- (UTA)  Explanation: Pt endorsed having vague HI without a specific plan.   Have You Used Any Alcohol or Drugs in the Past 24 Hours? -- (Unknown at this time)  How Long Ago Did You Use Drugs or Alcohol? No data recorded What Did You Use and How Much? UDS positive for cocaine and THC   Do You Currently Have a Therapist/Psychiatrist? Yes  Name of Therapist/Psychiatrist: [facility]   Have You Been Recently Discharged From Any Office Practice or Programs? No  Explanation of Discharge From Practice/Program: n/a     CCA Screening Triage Referral Assessment Type of Contact: Face-to-Face  Is this Initial or Reassessment? No data recorded Date Telepsych consult ordered in [facility]:  No data recorded Time Telepsych consult ordered in [facility]:  No data recorded  Patient Reported Information Reviewed? No data recorded Patient Left Without Being Seen? No data recorded Reason for Not Completing Assessment: Pt was uncooperative and did not engage  in assessment   Collateral Involvement: Kenneth Jensen - Guardian on call for Empowering lives - [phone]   Does Patient Have a Court Appointed Legal Guardian? No data recorded Name and Contact of Legal Guardian: No data recorded If Minor and Not Living with Parent(s), Who has Custody? n/a  Is CPS involved or ever been involved?  Never  Is APS involved or ever been involved? Never   Patient Determined To Be At Risk for Harm To Self or Others Based on Review of Patient Reported Information or Presenting Complaint? Yes, for Self-Harm  Method: No Plan  Availability of Means: No access or NA  Intent: Vague intent or NA  Notification Required: No need or identified person  Additional Information for Danger to Others Potential: Active psychosis  Additional Comments for Danger to Others Potential: n/a  Are There Guns or Other Weapons in Your Home? -- (UTA)  Types of Guns/Weapons: No access  Are These Weapons Safely Secured?                            -- (n/a)  Who Could Verify You Are Able To Have These Secured: n/a  Do You Have any Outstanding Charges, Pending Court Dates, Parole/Probation? None reported  Contacted To Inform of Risk of Harm To Self or Others: -- (n/a)   Location of Assessment: [facility] ED   Does Patient Present under Involuntary Commitment? Yes  IVC Papers Initial File Date: No data recorded  [place] of Residence: [place]   Patient Currently Receiving the Following Services: ACTT Psychologist, Educational)   Determination of Need: Emergent (2 hours)   Options For Referral: ED Visit     CCA Biopsychosocial Intake/Chief Complaint:  No data recorded Current Symptoms/Problems: No data recorded  Patient Reported Schizophrenia/Schizoaffective Diagnosis in Past: Yes   Strengths: Have some insight, seeking help and able to communicate needs.  Preferences: No data recorded Abilities: No data recorded  Type of Services Patient Feels are Needed: No data recorded  Initial Clinical Notes/Concerns: No data recorded  Mental Health Symptoms Depression:  Difficulty Concentrating; Change in energy/activity; Fatigue; Hopelessness; Irritability   Duration of Depressive symptoms: Greater than two weeks   Mania:  N/A   Anxiety:   Difficulty concentrating; Irritability;  Restlessness   Psychosis:  Hallucinations; Other negative symptoms   Duration of Psychotic symptoms: Greater than six months   Trauma:  N/A   Obsessions:  Poor insight; Recurrent & persistent thoughts/impulses/images   Compulsions:  Absent insight/delusional; Poor Insight; Repeated behaviors/mental acts; Driven to perform behaviors/acts   Inattention:  N/A   Hyperactivity/Impulsivity:  N/A   Oppositional/Defiant Behaviors:  Argumentative; Easily annoyed   Emotional Irregularity:  Intense/inappropriate anger; Intense/unstable relationships; Mood lability; Potentially harmful impulsivity; Transient, stress-related paranoia/disassociation   Other Mood/Personality Symptoms:  n/a    Mental Status Exam Appearance and self-care  Stature:  Small   Weight:  Average weight   Clothing:  Disheveled   Grooming:  Neglected   Cosmetic use:  None   Posture/gait:  Tense   Motor activity:  -- (Within normal range)   Sensorium  Attention:  Distractible   Concentration:  Focuses on irrelevancies; Scattered   Orientation:  Person   Recall/memory:  Defective in Immediate; Defective in Short-term; Defective in Recent; Defective in Remote   Affect and Mood  Affect:  Anxious; Labile   Mood:  Irritable; Anxious   Relating  Eye contact:  Staring   Facial expression:  Responsive; Tense  Attitude toward examiner:  Irritable; Guarded; Hostile   Thought and Language  Speech flow: Flight of Ideas   Thought content:  Delusions   Preoccupation:  Other (Comment)   Hallucinations:  Auditory (Unconvincingly denied)   Organization:  No data recorded  [employer] of Knowledge:  Fair   Intelligence:  Average   Abstraction:  Concrete   Judgement:  Impaired   Reality Testing:  Distorted   Insight:  Denial; Poor   Decision Making:  Impulsive   Social Functioning  Social Maturity:  Impulsive; Self-centered   Social Judgement:  Heedless; Chief Of Staff    Stress  Stressors:  Other (Comment)   Coping Ability:  Deficient supports   Skill Deficits:  Decision making   Supports:  Support needed     Religion: Religion/Spirituality Are You A Religious Person?: No How Might This Affect Treatment?: n/a  Leisure/Recreation: Leisure / Recreation Do You Have Hobbies?:  (UTA)  Exercise/Diet: Exercise/Diet Do You Exercise?:  (UTA) Have You Gained or Lost A Significant Amount of Weight in the Past Six Months?:  (UTA) Do You Follow a Special Diet?:  (UTA) Do You Have Any Trouble Sleeping?:  (UTA)   CCA Employment/Education Employment/Work Situation: Employment / Work Situation Employment Situation: On disability Why is Patient on Disability: Mental health How Long has Patient Been on Disability: unknown Patient's Job has Been Impacted by Current Illness: No Has Patient ever Been in the [employer]?: No  Education: Education Is Patient Currently Attending School?: No Did Theme Park Manager?: No Did You Have An Individualized Education Program (IIEP): No Did You Have Any Difficulty At School?: No   CCA Family/Childhood History Family and Relationship History: Family history Marital status: Single Does patient have children?: No  Childhood History:  Childhood History By whom was/is the patient raised?: Mother Did patient suffer any verbal/emotional/physical/sexual abuse as a child?: No Did patient suffer from severe childhood neglect?: No Has patient ever been sexually abused/assaulted/raped as an adolescent or adult?: No Was the patient ever a victim of a crime or a disaster?: No Witnessed domestic violence?: No Has patient been affected by domestic violence as an adult?: No  Child/Adolescent Assessment:     CCA Substance Use Alcohol/Drug Use: Alcohol / Drug Use Pain Medications: See MAR Prescriptions: See MAR Over the Counter: See MAR History of alcohol / drug use?: Yes Longest period of sobriety (when/how long):  Unable to quantify Negative Consequences of Use: Personal relationships Withdrawal Symptoms: None Substance #1 Name of Substance 1: Cocaine 1 - Age of First Use: unknown                       ASAM's:  Six Dimensions of Multidimensional Assessment  Dimension 1:  Acute Intoxication and/or Withdrawal Potential:   Dimension 1:  Description of individual's past and current experiences of substance use and withdrawal: Pt has a hx of and current use of cannabis and alcohol  Dimension 2:  Biomedical Conditions and Complications:   Dimension 2:  Description of patient's biomedical conditions and  complications: None noted  Dimension 3:  Emotional, Behavioral, or Cognitive Conditions and Complications:  Dimension 3:  Description of emotional, behavioral, or cognitive conditions and complications: Schizoaffective disorder, bipolar type  Dimension 4:  Readiness to Change:  Dimension 4:  Description of Readiness to Change criteria: Pt denies recent use  Dimension 5:  Relapse, Continued use, or Continued Problem Potential:  Dimension 5:  Relapse, continued use, or continued problem potential critiera  description: Pt identified living environment as a severe stressor.  Dimension 6:  Recovery/Living Environment:  Dimension 6:  Recovery/Iiving environment criteria description: Pt identified living environment as a severe stressor.  ASAM Severity Score: ASAM's Severity Rating Score: 13  ASAM Recommended Level of Treatment: ASAM Recommended Level of Treatment: Level II Intensive Outpatient Treatment   Substance use Disorder (SUD) Substance Use Disorder (SUD)  Checklist Symptoms of Substance Use: Continued use despite having a persistent/recurrent physical/psychological problem caused/exacerbated by use, Continued use despite persistent or recurrent social, interpersonal problems, caused or exacerbated by use, Presence of craving or strong urge to use  Recommendations for  Services/Supports/Treatments: Recommendations for Services/Supports/Treatments Recommendations For Services/Supports/Treatments: ACCTT (Assertive Community Treatment), Medication Management, Inpatient Hospitalization  DSM5 Diagnoses: Patient Active Problem List   Diagnosis Date Noted   Substance abuse (HCC) 11/29/2023   Chest pain 11/29/2023   Suicidal ideations 04/16/2023   Suicidal ideation 01/11/2023   Acute psychosis (HCC) 11/14/2022   Cocaine use disorder, severe, dependence (HCC) 09/04/2022   Vitamin D  deficiency, unspecified 06/08/2022   Mixed hyperlipidemia 06/08/2022   Cocaine abuse (HCC) 11/22/2021   Cannabis use disorder    Aggressive behavior    Prediabetes 05/16/2020   Auditory hallucinations    Paranoid schizophrenia (HCC) 03/25/2019   Schizophrenia (HCC) 08/02/2018   Cannabis use disorder, moderate, dependence (HCC) 04/21/2011   Tobacco use disorder 04/21/2011    Patient Centered Plan: Patient is on the following Treatment Plan(s):  Impulse Control   Referrals to Alternative Service(s): Referred to Alternative Service(s):   Place:   Date:   Time:    Referred to Alternative Service(s):   Place:   Date:   Time:    Referred to Alternative Service(s):   Place:   Date:   Time:    Referred to Alternative Service(s):   Place:   Date:   Time:      @BHCOLLABOFCARE @  [employer], LCAS-A

## 2023-11-30 NOTE — ED Notes (Signed)
Pt given [place] sandwich meal per request.

## 2023-11-30 NOTE — ED Triage Notes (Signed)
 Pt brought in by bpd.  Pt is IVC.  Pt has hx of schizophrenia and is off his meds.  Pt reports drug and etoh use.  Pt agitated in triage.

## 2023-11-30 NOTE — ED Provider Notes (Signed)
 [facility] Provider Note    Event Date/Time   First MD Initiated Contact with Patient 11/30/23 2032     (approximate)   History   Psychiatric Evaluation  Pt brought in by bpd.  Pt is IVC.  Pt has hx of schizophrenia and is off his meds.  Pt reports drug and etoh use.  Pt agitated in triage.      HPI Kenneth Jensen is a 33 y.o. male PMH cocaine abuse, schizophrenia, suicidal ideation presents for medical evaluation on IVC -Per accompanying paperwork, was placed on IVC for abnormal behavior, believes people were outside his home manage tell him and stated that he wanted to kill himself as well as the officers on scene -On my evaluation, patient is an overall very limited historian.  Hyperverbal, nonlinear.  Cooperative and pleasant however.  Denies SI, HI but does endorse unspecified hallucinations, states they are weird things.     Physical Exam   Triage Vital Signs: ED Triage Vitals  Encounter Vitals Group     BP 11/30/23 2017 (!) 123/98     Girls Systolic BP Percentile --      Girls Diastolic BP Percentile --      Boys Systolic BP Percentile --      Boys Diastolic BP Percentile --      Pulse Rate 11/30/23 2017 90     Resp 11/30/23 2017 18     Temp 11/30/23 2017 98.4 F (36.9 C)     Temp Source 11/30/23 2017 Oral     SpO2 11/30/23 2017 100 %     Weight 11/30/23 2014 198 lb 6.6 oz (90 kg)     Height 11/30/23 2014 5' 4 (1.626 m)     Head Circumference --      Peak Flow --      Pain Score 11/30/23 2014 0     Pain Loc --      Pain Education --      Exclude from Growth Chart --     Most recent vital signs: Vitals:   11/30/23 2017  BP: (!) 123/98  Pulse: 90  Resp: 18  Temp: 98.4 F (36.9 C)  SpO2: 100%     General: Awake, no distress.  HEENT: Normocephalic, atraumatic CV:  Good peripheral perfusion. RRR, RP 2+ Resp:  Normal effort. CTAB Abd:  No distention. Nontender to deep palpation throughout Psych:  Hyperverbal,  disorganized, nonlinear, endorses unspecified hallucinations, denies SI/HI   ED Results / Procedures / Treatments   Labs (all labs ordered are listed, but only abnormal results are displayed) Labs Reviewed  COMPREHENSIVE METABOLIC PANEL WITH GFR - Abnormal; Notable for the following components:      Result Value   Anion gap 16 (*)    All other components within normal limits  URINE DRUG SCREEN - Abnormal; Notable for the following components:   Cocaine POSITIVE (*)    Tetrahydrocannabinol POSITIVE (*)    All other components within normal limits  ETHANOL  CBC     EKG  Ecg = sinus rhythm, rate 73, no gross ST elevation or depression, no significant repolarization abnormality, rightward axis, normal intervals.  QTc 436.  Unremarkable EKG.   RADIOLOGY N/a    PROCEDURES:  Critical Care performed: No  Procedures   MEDICATIONS ORDERED IN ED: Medications  OLANZapine  (ZYPREXA ) tablet 5 mg (has no administration in time range)     IMPRESSION / MDM / ASSESSMENT AND PLAN / ED COURSE  I reviewed  the triage vital signs and the nursing notes.                              DDX/MDM/AP: Differential diagnosis includes, but is not limited to, primary psychiatric disorder, suspect likely concomitant substance use given history, do not suspect underlying organic etiology of presentation at this time.  Does meet IVC criteria, gravely disabled, did reportedly expressed desire to harm self and others previously as well--Will maintain IVC.  Plan: - Labs - Oral Zyprexa  - Will maintain IVC - Psychiatry consult  Patient's presentation is most consistent with acute presentation with potential threat to life or bodily function.   ED course below.  Laboratory workup unremarkable, positive for cocaine and THC.  Medically cleared.  IVC maintained.  Psychiatry consulted, disposition per psychiatry service.  Clinical Course as of 11/30/23 2216  Mon Nov 30, 2023  2046 Cbc wnl [MM]  2145  COCAINE(!): POSITIVE [MM]  2145 Tetrahydrocannabinol(!): POSITIVE [MM]  2145 CMP unremarkable  Ethanol undetectable [MM]    Clinical Course User Index [MM] [name] LABOR, MD     FINAL CLINICAL IMPRESSION(S) / ED DIAGNOSES   Final diagnoses:  Psychosis, unspecified psychosis type [facility])     Rx / DC Orders   ED Discharge Orders     None        Note:  This document was prepared using Dragon voice recognition software and may include unintentional dictation errors.   [name] LABOR, MD 11/30/23 [phone]

## 2023-11-30 NOTE — ED Notes (Signed)
 Pt behaviors erratic. Yelling in room and out in hall. Yelling for food. Advised pt food would be provided once seen by ED physician. Pt out of room to go to bathroom and induced vomiting - saliva noted in toilet. Pt states this is from being so hungry.

## 2023-11-30 NOTE — ED Notes (Signed)
 ivc prior to arrival/psych consult ordered/pending.Kenneth KitchenMarland Jensen

## 2023-12-01 NOTE — ED Notes (Signed)
 [place]  [facility]  called for  transfer to  eb[facility]

## 2023-12-01 NOTE — ED Notes (Signed)
 Pt provided breakfast tray.
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
## 2023-12-01 NOTE — ED Provider Notes (Signed)
 Emergency Medicine Observation Re-evaluation Note  Kenneth Jensen is a 33 y.o. male, seen on rounds today.  Pt initially presented to the ED for complaints of Psychiatric Evaluation  Currently, the patient is no acute distress.  No issues  Physical Exam  Blood pressure (!) 123/98, pulse 90, temperature 98.4 F (36.9 C), temperature source Oral, resp. rate 18, height 5' 4 (1.626 m), weight 90 kg, SpO2 100%.  Physical Exam General: No apparent distress Pulm: Normal WOB Psych: resting     ED Course / MDM   Clinical Course as of 12/01/23 0155  Mon Nov 30, 2023  2046 Cbc wnl [MM]  2145 COCAINE(!): POSITIVE [MM]  2145 Tetrahydrocannabinol(!): POSITIVE [MM]  2145 CMP unremarkable  Ethanol undetectable [MM]    Clinical Course User Index [MM] [name] LABOR, MD    I have reviewed the labs performed to date as well as medications administered while in observation.  Recent changes in the last 24 hours include none   Plan   Current plan is to continue to wait for psych plan/placement if felt warranted  Patient is under full IVC at this time.   [name], MD 12/01/23 [phone]

## 2023-12-01 NOTE — ED Notes (Signed)
 ivc/consult done/recommended for inpatient psychiatric hospitalization after medical hospitalization.

## 2023-12-01 NOTE — ED Provider Notes (Signed)
 Emergency Medicine Observation Re-evaluation Note  Kenneth Jensen is a 33 y.o. male, seen on rounds today.  Pt initially presented to the ED for complaints of Psychiatric Evaluation  Currently, the patient is is no acute distress. Denies any concerns at this time.  Physical Exam  Blood pressure (!) 123/98, pulse 90, temperature 98.4 F (36.9 C), temperature source Oral, resp. rate 18, height 5' 4 (1.626 m), weight 90 kg, SpO2 100%.  Physical Exam: General: No apparent distress Pulm: Normal WOB Neuro: Moving all extremities Psych: Resting comfortably     ED Course / MDM   Clinical Course as of 12/01/23 1049  Mon Nov 30, 2023  2046 Cbc wnl [MM]  2145 COCAINE(!): POSITIVE [MM]  2145 Tetrahydrocannabinol(!): POSITIVE [MM]  2145 CMP unremarkable  Ethanol undetectable [MM]    Clinical Course User Index [MM] [name] LABOR, MD    I have reviewed the labs performed to date as well as medications administered while in observation.  Recent changes in the last 24 hours include: No acute events overnight.  Plan   Current plan: Patient awaiting transport.  Plan for going for inpatient psychiatry to [facility] Patient is under full IVC at this time.    Suzanne Kirsch, MD 12/01/23 1050

## 2023-12-01 NOTE — Progress Notes (Signed)
 Per [facility], there are no appropriate beds available within [facility] system. Patient was referred to the following facilities:   Service Provider Phone  [facility]  [phone]  [facility] Patient Placement  [phone]  [facility]  [phone]  [facility]  [phone]  [facility] [phone]  [facility]  [phone]  [facility]  [phone]  [facility]  [phone]  [facility]  [phone]  [facility]  [phone]  [facility]  [phone]  [place] [facility]  [phone]  [facility]  [phone]  [facility]  [phone]  [facility]  [phone]  [facility]  [phone]  [facility]  [phone]  [facility]  [phone]  [facility]  [address], [place] [phone]

## 2023-12-01 NOTE — BH Assessment (Signed)
 Per [facility] Rick B[facility] patient to be referred out of system.  Referral information for Psychiatric Hospitalization faxed to;   [facility]  [phone]  [facility] Patient Placement  [phone]  [facility]  [phone]  [facility]  [phone]  [facility]  [phone]  [facility]  [phone]  [facility]  [phone]  [facility]  [phone]  [facility]  [phone]  [facility]  [phone]  [facility]  [phone]  [place] [facility]  [phone]  [facility]  [phone]

## 2023-12-01 NOTE — Progress Notes (Signed)
 Patient has been accepted to [facility] for 12/01/23. Patient was assigned to [facility]. Accepting physician is Dr. Millie Manners. Call report to [phone] Option 2 (Please leave voicemail). Representative was [employer]   ER Staff is aware of it: Olam, ER Secretary Dr. Suzanne, ER MD Olam PEAK, Patient's Nurse   Address:  [address]                  [place], [place] [address]

## 2023-12-11 ENCOUNTER — Other Ambulatory Visit: Payer: Self-pay

## 2023-12-11 ENCOUNTER — Emergency Department
Admission: EM | Admit: 2023-12-11 | Discharge: 2023-12-12 | Disposition: A | Payer: Medicare (Managed Care) | Attending: Emergency Medicine | Admitting: Emergency Medicine

## 2023-12-11 DIAGNOSIS — R112 Nausea with vomiting, unspecified: Secondary | ICD-10-CM | POA: Insufficient documentation

## 2023-12-11 DIAGNOSIS — R1033 Periumbilical pain: Secondary | ICD-10-CM | POA: Insufficient documentation

## 2023-12-11 LAB — COMPREHENSIVE METABOLIC PANEL WITH GFR
ALT: 13 U/L (ref 0–44)
AST: 23 U/L (ref 15–41)
Albumin: 4 g/dL (ref 3.5–5.0)
Alkaline Phosphatase: 61 U/L (ref 38–126)
Anion gap: 14 (ref 5–15)
BUN: 6 mg/dL (ref 6–20)
CO2: 25 mmol/L (ref 22–32)
Calcium: 8.8 mg/dL — ABNORMAL LOW (ref 8.9–10.3)
Chloride: 99 mmol/L (ref 98–111)
Creatinine, Ser: 0.7 mg/dL (ref 0.61–1.24)
GFR, Estimated: >60 mL/min (ref >60)
Glucose, Bld: 114 mg/dL — ABNORMAL HIGH (ref 70–99)
Potassium: 3.6 mmol/L (ref 3.5–5.1)
Sodium: 138 mmol/L (ref 135–145)
Total Bilirubin: 0.4 mg/dL (ref 0.0–1.2)
Total Protein: 6.7 g/dL (ref 6.5–8.1)

## 2023-12-11 LAB — TYPE AND SCREEN
ABO/RH(D): O POS
Antibody Screen: NEGATIVE

## 2023-12-11 LAB — CBC WITH DIFFERENTIAL/PLATELET
Abs Immature Granulocytes: 0.03 K/uL (ref 0.00–0.07)
Basophils Absolute: 0 K/uL (ref 0.0–0.1)
Basophils Relative: 0 %
Eosinophils Absolute: 0.2 K/uL (ref 0.0–0.5)
Eosinophils Relative: 3 %
HCT: 37.6 % — ABNORMAL LOW (ref 39.0–52.0)
Hemoglobin: 12.8 g/dL — ABNORMAL LOW (ref 13.0–17.0)
Immature Granulocytes: 0 %
Lymphocytes Relative: 31 %
Lymphs Abs: 2.7 K/uL (ref 0.7–4.0)
MCH: 29.2 pg (ref 26.0–34.0)
MCHC: 34 g/dL (ref 30.0–36.0)
MCV: 85.6 fL (ref 80.0–100.0)
Monocytes Absolute: 0.9 K/uL (ref 0.1–1.0)
Monocytes Relative: 10 %
Neutro Abs: 4.9 K/uL (ref 1.7–7.7)
Neutrophils Relative %: 56 %
Platelets: 266 K/uL (ref 150–400)
RBC: 4.39 MIL/uL (ref 4.22–5.81)
RDW: 13.2 % (ref 11.5–15.5)
WBC: 8.8 K/uL (ref 4.0–10.5)
nRBC: 0 % (ref 0.0–0.2)

## 2023-12-11 LAB — LIPASE, BLOOD: Lipase: 12 U/L (ref 11–51)

## 2023-12-11 MED ORDER — LACTATED RINGERS IV BOLUS
1000.0000 mL | Freq: Once | INTRAVENOUS | Status: AC
  Administered 2023-12-11: 1000 mL via INTRAVENOUS

## 2023-12-11 MED ORDER — KETOROLAC TROMETHAMINE 30 MG/ML IJ SOLN
15.0000 mg | Freq: Once | INTRAMUSCULAR | Status: AC
  Administered 2023-12-11: 15 mg via INTRAVENOUS
  Filled 2023-12-11: qty 1

## 2023-12-11 MED ORDER — ONDANSETRON 8 MG PO TBDP: 8.0000 mg | ORAL_TABLET | Freq: Three times a day (TID) | ORAL | 0 refills | Status: AC | PRN

## 2023-12-11 MED ORDER — ONDANSETRON HCL 4 MG/2ML IJ SOLN
4.0000 mg | Freq: Once | INTRAMUSCULAR | Status: AC
  Administered 2023-12-11: 4 mg via INTRAVENOUS
  Filled 2023-12-11: qty 2

## 2023-12-11 NOTE — ED Notes (Signed)
 Patient is up for discharge this RN is attempting to contact legal guardian listed in patient demographics.

## 2023-12-11 NOTE — ED Provider Notes (Signed)
 [facility] Provider Note   Event Date/Time   First MD Initiated Contact with Patient 12/11/23 1853     (approximate) History  Abdominal Pain (33 y/o M, BIBA w/ c/o abdominal pain along with N/V )  HPI Kenneth Jensen is a 33 y.o. male with a past medical history of schizophrenia and bipolar disorder who presents complaining of periumbilical abdominal pain with associated nausea and vomiting.  Patient is concerned as he had an episode of vomit that had bright red contents however patient admits to having a red Gatorade recently.  Patient states that the symptoms began approximately 1 hour prior to arrival.  Pain has not changed since onset ROS: Patient currently denies any vision changes, tinnitus, difficulty speaking, facial droop, sore throat, chest pain, shortness of breath, diarrhea, dysuria, or weakness/numbness/paresthesias in any extremity   Physical Exam  Triage Vital Signs: ED Triage Vitals  Encounter Vitals Group     BP 12/11/23 1855 (!) 146/97     Girls Systolic BP Percentile --      Girls Diastolic BP Percentile --      Boys Systolic BP Percentile --      Boys Diastolic BP Percentile --      Pulse Rate 12/11/23 1855 87     Resp --      Temp 12/11/23 1855 98.3 F (36.8 C)     Temp Source 12/11/23 1855 Oral     SpO2 12/11/23 1855 100 %     Weight 12/11/23 1856 226 lb 10.1 oz (102.8 kg)     Height 12/11/23 1856 5' 4 (1.626 m)     Head Circumference --      Peak Flow --      Pain Score 12/11/23 1856 4     Pain Loc --      Pain Education --      Exclude from Growth Chart --    Most recent vital signs: Vitals:   12/11/23 2100 12/11/23 2200  BP: 129/89 129/88  Pulse: 74 71  Resp:  16  Temp:    SpO2: 99% 99%   General: Awake, oriented x4. CV:  Good peripheral perfusion. Resp:  Normal effort. Abd:  No distention. Other:  Patient is a middle-aged overweight African-American male resting comfortably in no acute distress ED Results /  Procedures / Treatments  Labs (all labs ordered are listed, but only abnormal results are displayed) Labs Reviewed  CBC WITH DIFFERENTIAL/PLATELET - Abnormal; Notable for the following components:      Result Value   Hemoglobin 12.8 (*)    HCT 37.6 (*)    All other components within normal limits  COMPREHENSIVE METABOLIC PANEL WITH GFR - Abnormal; Notable for the following components:   Glucose, Bld 114 (*)    Calcium 8.8 (*)    All other components within normal limits  LIPASE, BLOOD  CBC WITH DIFFERENTIAL/PLATELET  TYPE AND SCREEN   PROCEDURES: Critical Care performed: No Procedures MEDICATIONS ORDERED IN ED: Medications  ondansetron  (ZOFRAN ) injection 4 mg (4 mg Intravenous Given 12/11/23 2029)  lactated ringers  bolus 1,000 mL (1,000 mLs Intravenous New Bag/Given 12/11/23 2029)  ketorolac  (TORADOL ) 30 MG/ML injection 15 mg (15 mg Intravenous Given 12/11/23 2029)   IMPRESSION / MDM / ASSESSMENT AND PLAN / ED COURSE  I reviewed the triage vital signs and the nursing notes.  The patient is on the cardiac monitor to evaluate for evidence of arrhythmia and/or significant heart rate changes. Patient's presentation is most consistent with acute presentation with potential threat to life or bodily function. Patient a 33 year old male with the above-stated past medical history presents complaining of abdominal pain with nausea and vomiting DDx: Small bowel obstruction, cholecystitis, appendicitis, Boerhaave syndrome, Mallory-Weiss tear Plan: CBC, CMP, lipase, type and screen  Laboratory evaluations not show any evidence of acute abnormalities.  Patient's p.o. tolerant after treatment and dehydration repleted with LR.  Patient stable for discharge at this time with outpatient PCP follow-up as needed.  Patient will be provided a Zofran  prescription for continued nausea or vomiting.  Without leukocytosis or elevation in lipase, I have low suspicion for  intra-abdominal surgical pathology.  Patient given strict return precautions and all questions answered prior to discharge  Dispo: Discharge home with PCP follow-up   FINAL CLINICAL IMPRESSION(S) / ED DIAGNOSES   Final diagnoses:  Periumbilical abdominal pain  Nausea and vomiting, unspecified vomiting type   Rx / DC Orders   ED Discharge Orders          Ordered    ondansetron  (ZOFRAN -ODT) 8 MG disintegrating tablet  Every 8 hours PRN        12/11/23 2233           Note:  This document was prepared using Dragon voice recognition software and may include unintentional dictation errors.   Kimiah Hibner K, MD 12/11/23 [phone]

## 2023-12-11 NOTE — ED Notes (Signed)
 Attempted to contact patient legal guardian again. Voicemail left with no patient identifiers.

## 2023-12-12 LAB — URINALYSIS, ROUTINE W REFLEX MICROSCOPIC
Bacteria, UA: NONE SEEN
Glucose, UA: NEGATIVE mg/dL
Hgb urine dipstick: NEGATIVE
Ketones, ur: NEGATIVE mg/dL
Leukocytes,Ua: NEGATIVE
Nitrite: NEGATIVE
Protein, ur: 100 mg/dL — AB
Specific Gravity, Urine: 1.026 (ref 1.005–1.030)
pH: 5 (ref 5.0–8.0)

## 2023-12-12 NOTE — ED Notes (Signed)
 Upon awaking this morning, Ambulance Person and I discussed with pt we were just waiting on a return phone call from pt's legal guardian and he was discharged. He said I want to be seen for my schizophrenia and be in a room. It was explained to him he had to check back in with a new complaint as he was discharged. It was also explained to pt that he would still be in a hallway bed if he checked back in. In these conversations pt has changed his mind several times about leaving or checking back in. We are still awaiting return phone call from his legal guardian after several attempts have been made to contact him and the crisis line overnight, messages left.

## 2023-12-12 NOTE — ED Notes (Signed)
 Called legal guardian with no answer. Voicemail left.

## 2023-12-12 NOTE — ED Notes (Signed)
 Attempted to call legal guardian again without any success. Pt attempting to leave. Per note from last visit, pt lives alone and is able to make decisions for himself. Pt stating I might be back. Pt left but given d/c instructions.

## 2023-12-12 NOTE — ED Notes (Signed)
 Patient ready for discharge. Called guardian [name] cell left voicemail, voicemail msg listed crisis line. Called Crisis line and left voicemail of direct line for discharge ride.

## 2023-12-12 NOTE — ED Provider Notes (Signed)
.-----------------------------------------   7:24 AM on 12/12/2023 -----------------------------------------  Blood pressure (!) 107/55, pulse 78, temperature 98.3 F (36.8 C), temperature source Oral, resp. rate 15, height 5' 4 (1.626 m), weight 102.8 kg, SpO2 100%.  Assuming care from Dr. Cyrena.  In short, Kenneth Jensen is a 33 y.o. male with a chief complaint of nausea vomiting.  Refer to the original H&P for additional details.  The current plan of care is to follow-up UA, able to be discharged after.  Patient was signed out after he had received a workup for his nausea vomiting.  Has history of schizophrenia, he was pending legal guardian notification prior to discharge.  Had reportedly been been asking to be placed in the room, asking for food, when told that he might still be in a hallway bed, change his mind about wanting psych evaluation.  Prior to discharge, stated that he had some dysuria and hematuria.  Patient does not appear gravely disabled, lives at home and cares for himself.  He is signed out pending UA.  Discharge after UA.  UA is not consistent with UTI.  Will discharge with instructions to follow-up with primary care outpatient as well as to take his medications as prescribed.  Strict return precautions given.      Medications  ondansetron  (ZOFRAN ) injection 4 mg (4 mg Intravenous Given 12/11/23 2029)  lactated ringers  bolus 1,000 mL (0 mLs Intravenous Stopped 12/11/23 2241)  ketorolac  (TORADOL ) 30 MG/ML injection 15 mg (15 mg Intravenous Given 12/11/23 2029)     ED Discharge Orders          Ordered    ondansetron  (ZOFRAN -ODT) 8 MG disintegrating tablet  Every 8 hours PRN        12/11/23 2233           Final diagnoses:  Periumbilical abdominal pain  Nausea and vomiting, unspecified vomiting type      Waymond Lorelle Cummins, MD 12/12/23 [phone]

## 2024-01-12 ENCOUNTER — Other Ambulatory Visit: Payer: Self-pay

## 2024-01-12 ENCOUNTER — Emergency Department
Admission: EM | Admit: 2024-01-12 | Discharge: 2024-01-12 | Disposition: A | Discharge status: Home / Self Care | Payer: Medicare (Managed Care) | Attending: Emergency Medicine | Admitting: Emergency Medicine

## 2024-01-12 ENCOUNTER — Encounter: Payer: Self-pay | Admitting: *Deleted

## 2024-01-12 DIAGNOSIS — R6889 Other general symptoms and signs: Secondary | ICD-10-CM

## 2024-01-12 DIAGNOSIS — R519 Headache, unspecified: Secondary | ICD-10-CM | POA: Diagnosis not present

## 2024-01-12 DIAGNOSIS — F22 Delusional disorders: Secondary | ICD-10-CM | POA: Insufficient documentation

## 2024-01-12 DIAGNOSIS — R059 Cough, unspecified: Secondary | ICD-10-CM | POA: Diagnosis not present

## 2024-01-12 DIAGNOSIS — R0981 Nasal congestion: Secondary | ICD-10-CM | POA: Diagnosis not present

## 2024-01-12 DIAGNOSIS — F1211 Cannabis abuse, in remission: Secondary | ICD-10-CM

## 2024-01-12 DIAGNOSIS — F259 Schizoaffective disorder, unspecified: Secondary | ICD-10-CM | POA: Diagnosis not present

## 2024-01-12 DIAGNOSIS — F1411 Cocaine abuse, in remission: Secondary | ICD-10-CM

## 2024-01-12 DIAGNOSIS — J45909 Unspecified asthma, uncomplicated: Secondary | ICD-10-CM | POA: Insufficient documentation

## 2024-01-12 DIAGNOSIS — Z8659 Personal history of other mental and behavioral disorders: Secondary | ICD-10-CM | POA: Diagnosis not present

## 2024-01-12 DIAGNOSIS — Z79899 Other long term (current) drug therapy: Secondary | ICD-10-CM | POA: Diagnosis not present

## 2024-01-12 LAB — RESP PANEL BY RT-PCR (RSV, FLU A&B, COVID)  RVPGX2
Influenza A by PCR: NEGATIVE
Influenza B by PCR: NEGATIVE
Resp Syncytial Virus by PCR: NEGATIVE
SARS Coronavirus 2 by RT PCR: NEGATIVE

## 2024-01-12 LAB — VALPROIC ACID LEVEL: Valproic Acid Lvl: <10 ug/mL — ABNORMAL LOW (ref 50–100)

## 2024-01-12 LAB — COMPREHENSIVE METABOLIC PANEL WITH GFR
ALT: 10 U/L (ref 0–44)
AST: 20 U/L (ref 15–41)
Albumin: 4.2 g/dL (ref 3.5–5.0)
Alkaline Phosphatase: 63 U/L (ref 38–126)
Anion gap: 14 (ref 5–15)
BUN: 8 mg/dL (ref 6–20)
CO2: 26 mmol/L (ref 22–32)
Calcium: 8.9 mg/dL (ref 8.9–10.3)
Chloride: 98 mmol/L (ref 98–111)
Creatinine, Ser: 0.93 mg/dL (ref 0.61–1.24)
GFR, Estimated: >60 mL/min
Glucose, Bld: 95 mg/dL (ref 70–99)
Potassium: 3.7 mmol/L (ref 3.5–5.1)
Sodium: 138 mmol/L (ref 135–145)
Total Bilirubin: 0.5 mg/dL (ref 0.0–1.2)
Total Protein: 7.1 g/dL (ref 6.5–8.1)

## 2024-01-12 LAB — CBC
HCT: 38.6 % — ABNORMAL LOW (ref 39.0–52.0)
Hemoglobin: 12.9 g/dL — ABNORMAL LOW (ref 13.0–17.0)
MCH: 29 pg (ref 26.0–34.0)
MCHC: 33.4 g/dL (ref 30.0–36.0)
MCV: 86.7 fL (ref 80.0–100.0)
Platelets: 320 K/uL (ref 150–400)
RBC: 4.45 MIL/uL (ref 4.22–5.81)
RDW: 13.5 % (ref 11.5–15.5)
WBC: 10.7 K/uL — ABNORMAL HIGH (ref 4.0–10.5)
nRBC: 0 % (ref 0.0–0.2)

## 2024-01-12 LAB — ETHANOL: Alcohol, Ethyl (B): <15 mg/dL

## 2024-01-12 MED ORDER — QUETIAPINE FUMARATE 200 MG PO TABS
200.0000 mg | ORAL_TABLET | Freq: Every day | ORAL | Status: DC
  Administered 2024-01-12: 200 mg via ORAL
  Filled 2024-01-12: qty 1

## 2024-01-12 MED ORDER — PALIPERIDONE ER 3 MG PO TB24
9.0000 mg | ORAL_TABLET | Freq: Every day | ORAL | Status: DC
  Administered 2024-01-12: 9 mg via ORAL
  Filled 2024-01-12: qty 3

## 2024-01-12 MED ORDER — BENZTROPINE MESYLATE 1 MG PO TABS
0.5000 mg | ORAL_TABLET | Freq: Every day | ORAL | Status: DC
  Administered 2024-01-12: 0.5 mg via ORAL
  Filled 2024-01-12: qty 1

## 2024-01-12 MED ORDER — DIVALPROEX SODIUM ER 500 MG PO TB24
1000.0000 mg | ORAL_TABLET | Freq: Every day | ORAL | Status: DC
  Filled 2024-01-12: qty 4

## 2024-01-12 MED ORDER — PRAZOSIN HCL 1 MG PO CAPS
1.0000 mg | ORAL_CAPSULE | Freq: Every day | ORAL | Status: DC
  Filled 2024-01-12: qty 1

## 2024-01-12 MED ORDER — TRAZODONE HCL 50 MG PO TABS: 50.0000 mg | ORAL_TABLET | Freq: Every evening | ORAL | Status: DC | PRN

## 2024-01-12 MED ORDER — IBUPROFEN 600 MG PO TABS
600.0000 mg | ORAL_TABLET | Freq: Four times a day (QID) | ORAL | Status: DC | PRN
  Administered 2024-01-12: 600 mg via ORAL
  Filled 2024-01-12: qty 1

## 2024-01-12 NOTE — ED Notes (Signed)
Pt refused vital signs being taken.

## 2024-01-12 NOTE — ED Notes (Signed)
 Patient sleeping
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
## 2024-01-12 NOTE — BH Assessment (Signed)
 Patient was deferred to [facility] for a telepsych assessment. The assigned care coordinator will provide updates regarding the scheduling of the assessment. [facility] coordinator can be reached at [phone] for further information on the timing of the telepsych evaluation.

## 2024-01-12 NOTE — ED Notes (Signed)
 Report given to receiving RN; pt prepared for transport to BHU.

## 2024-01-12 NOTE — ED Notes (Signed)
 Pt's legal guardian, Calton Norrie, contacted and updated about d/c plan. Legal guardian in agreement with d/c plan.

## 2024-01-12 NOTE — ED Notes (Signed)
 Pt given snack.

## 2024-01-12 NOTE — ED Notes (Signed)
Patient in day room. 
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
## 2024-01-12 NOTE — ED Notes (Addendum)
Pt given belongings bag.  

## 2024-01-12 NOTE — ED Notes (Signed)
 VOL/ Moved from 19H to BHU 3 / Pending consult

## 2024-01-12 NOTE — Discharge Instructions (Signed)
You have been seen in the emergency department for a  psychiatric concern. You have been evaluated both medically as well as psychiatrically. Please follow-up with your outpatient resources provided. Return to the emergency department for any worsening symptoms, or any thoughts of hurting yourself or anyone else so that we may attempt to help you. 

## 2024-01-12 NOTE — ED Notes (Signed)
 Pt stated he would walk home. This is okay per legal guardian, Cassandra Massenburg.

## 2024-01-12 NOTE — ED Notes (Signed)
 Staff attempted to draw labs unsuccessfully; lab called. Pt currently lying on stretcher with blanket over his head. Asking for medication for a headache. Alert and calm at this time.

## 2024-01-12 NOTE — ED Provider Notes (Signed)
----------------------------------------- °  2:31 PM on 01/12/2024 ----------------------------------------- Patient has been seen and evaluated by psychiatry.  They believe the patient is safe for discharge home from their standpoint.  Patient's lab work today shows reassuring CBC reassuring chemistry negative respiratory panel.  Patient has an appointment later today with his [facility] team   Dorothyann Drivers, MD 01/12/24 1431

## 2024-01-12 NOTE — ED Provider Notes (Signed)
 "  [facility] Provider Note    Event Date/Time   First MD Initiated Contact with Patient 01/12/24 0125     (approximate)   History   Psychiatric Evaluation   HPI  Kenneth Jensen is a 34 y.o. male with history of schizophrenia, bipolar disorder, asthma who is well-known to our emergency department who presents today with concerns for paranoia.  Patient states he is not feeling well.  He is unable to tell me if he is having any hallucinations and appears to be responding to internal stimuli here.  Denies SI or HI.  Denies drug or alcohol use.  He is complaining of headache, cough and congestion.  No fever, chest pain or shortness of breath, vomiting or diarrhea.   History provided by patient.    Past Medical History:  Diagnosis Date   Asthma    Bipolar 1 disorder (HCC)    Depression    Schizophrenia (HCC)    Schizotaxia     History reviewed. No pertinent surgical history.  MEDICATIONS:  Prior to Admission medications  Medication Sig Start Date End Date Taking? Authorizing Provider  benztropine  (COGENTIN ) 0.5 MG tablet Take 0.5 mg by mouth daily. 04/02/23   [provider]  divalproex  (DEPAKOTE  ER) 500 MG 24 hr tablet Take 2 tablets (1,000 mg total) by mouth at bedtime for 14 days. 09/09/22 11/15/23  Massengill, Rankin, MD  haloperidol  (HALDOL ) 10 MG tablet Take 10 mg by mouth 3 (three) times daily. Patient not taking: Reported on 07/14/2023 04/27/23   [provider]  nicotine  polacrilex (NICORETTE ) 2 MG gum Take 1 each (2 mg total) by mouth as needed for smoking cessation. 09/09/22   Massengill, Rankin, MD  ondansetron  (ZOFRAN -ODT) 8 MG disintegrating tablet Take 1 tablet (8 mg total) by mouth every 8 (eight) hours as needed for nausea or vomiting. 12/11/23   Bradler, Evan K, MD  paliperidone  (INVEGA  SUSTENNA) 234 MG/1.5ML injection Inject 234 mg into the muscle every 28 (twenty-eight) days. 06/04/21   Clapacs, Norleen DASEN, MD  paliperidone   (INVEGA ) 3 MG 24 hr tablet Take 3 mg by mouth at bedtime. Patient not taking: Reported on 11/15/2023 12/09/22   [provider]  paliperidone  (INVEGA ) 9 MG 24 hr tablet Take 9 mg by mouth daily. 11/05/23   [provider]  prazosin  (MINIPRESS ) 1 MG capsule Take 1 capsule (1 mg total) by mouth at bedtime. 09/09/22 11/15/23  Massengill, Rankin, MD  QUEtiapine  (SEROQUEL ) 200 MG tablet Take 200 mg by mouth at bedtime. 07/09/23   [provider]  traZODone  (DESYREL ) 50 MG tablet Take 1 tablet (50 mg total) by mouth at bedtime as needed for up to 14 days for sleep. 09/09/22 07/10/23  Johny Rankin, MD    Physical Exam   Triage Vital Signs: ED Triage Vitals  Encounter Vitals Group     BP 01/12/24 0109 134/88     Girls Systolic BP Percentile --      Girls Diastolic BP Percentile --      Boys Systolic BP Percentile --      Boys Diastolic BP Percentile --      Pulse Rate 01/12/24 0109 87     Resp 01/12/24 0109 16     Temp 01/12/24 0109 97.8 F (36.6 C)     Temp Source 01/12/24 0109 Oral     SpO2 01/12/24 0109 99 %     Weight --      Height --      Head  Circumference --      Peak Flow --      Pain Score 01/12/24 0107 8     Pain Loc --      Pain Education --      Exclude from Growth Chart --     Most recent vital signs: Vitals:   01/12/24 0109 01/12/24 0109  BP: 134/88   Pulse: 87   Resp: 16   Temp:  97.8 F (36.6 C)  SpO2: 99%     CONSTITUTIONAL: Alert, responds appropriately to questions.  Tearful HEAD: Normocephalic, atraumatic EYES: Conjunctivae clear, pupils appear equal, sclera nonicteric ENT: normal nose; moist mucous membranes NECK: Supple, normal ROM CARD: RRR; S1 and S2 appreciated RESP: Normal chest excursion without splinting or tachypnea; breath sounds clear and equal bilaterally; no wheezes, no rhonchi, no rales, no hypoxia or respiratory distress, speaking full sentences ABD/GI: Non-distended; soft, non-tender, no rebound, no guarding, no  peritoneal signs BACK: The back appears normal EXT: Normal ROM in all joints; no deformity noted, no edema SKIN: Normal color for age and race; warm; no rash on exposed skin NEURO: Moves all extremities equally, normal speech PSYCH: Odd affect.  Appears to be responding to internal stimuli.  Denies SI or HI.   ED Results / Procedures / Treatments   LABS: (all labs ordered are listed, but only abnormal results are displayed) Labs Reviewed  CBC - Abnormal; Notable for the following components:      Result Value   WBC 10.7 (*)    Hemoglobin 12.9 (*)    HCT 38.6 (*)    All other components within normal limits  VALPROIC ACID  LEVEL - Abnormal; Notable for the following components:   Valproic Acid  Lvl <10 (*)    All other components within normal limits  RESP PANEL BY RT-PCR (RSV, FLU A&B, COVID)  RVPGX2  COMPREHENSIVE METABOLIC PANEL WITH GFR  ETHANOL  URINE DRUG SCREEN     EKG:  EKG Interpretation Date/Time:    Ventricular Rate:    PR Interval:    QRS Duration:    QT Interval:    QTC Calculation:   R Axis:      Text Interpretation:           RADIOLOGY: My personal review and interpretation of imaging:    I have personally reviewed all radiology reports.   No results found.   PROCEDURES:  Critical Care performed: No      Procedures    IMPRESSION / MDM / ASSESSMENT AND PLAN / ED COURSE  I reviewed the triage vital signs and the nursing notes.    Patient here for paranoia, possible decompensated schizophrenia.     DIFFERENTIAL DIAGNOSIS (includes but not limited to):   Decompensated schizophrenia, bipolar disorder, psychosis, paranoia, substance use disorder   Patient's presentation is most consistent with acute presentation with potential threat to life or bodily function.   PLAN: Will obtain screening labs and urine.  Will reorder his home medications.  Will consult psychiatry and TTS.  The patient has been placed in psychiatric  observation due to the need to provide a safe environment for the patient while obtaining psychiatric consultation and evaluation, as well as ongoing medical and medication management to treat the patient's condition.  The patient has not been placed under full IVC at this time.    MEDICATIONS GIVEN IN ED: Medications  benztropine  (COGENTIN ) tablet 0.5 mg (has no administration in time range)  divalproex  (DEPAKOTE  ER) 24 hr tablet 1,000 mg (0  mg Oral Hold 01/12/24 0205)  paliperidone  (INVEGA ) 24 hr tablet 9 mg (has no administration in time range)  prazosin  (MINIPRESS ) capsule 1 mg (1 mg Oral Not Given 01/12/24 0214)  QUEtiapine  (SEROQUEL ) tablet 200 mg (200 mg Oral Given 01/12/24 0205)  traZODone  (DESYREL ) tablet 50 mg (has no administration in time range)  ibuprofen  (ADVIL ) tablet 600 mg (600 mg Oral Given 01/12/24 0205)     ED COURSE: Labs show normal electrolytes, hemoglobin.  Alcohol level negative.  Depakote  level subtherapeutic.  COVID, flu and RSV negative.  Patient medically cleared at this time for psychiatric disposition.   CONSULTS: Psychiatry and TTS consulted.   OUTSIDE RECORDS REVIEWED: Reviewed recent psychiatric notes per       FINAL CLINICAL IMPRESSION(S) / ED DIAGNOSES   Final diagnoses:  Paranoia (HCC)  Flu-like symptoms     Rx / DC Orders   ED Discharge Orders     None        Note:  This document was prepared using Dragon voice recognition software and may include unintentional dictation errors.   [name], Josette SAILOR, DO 01/12/24 [phone]  "

## 2024-01-12 NOTE — Progress Notes (Signed)
 Per TTS, spoke to Blunt of [facility] ACTT services to obtain collateral.  Bernarda stated that there are no current concerns with patient at this time.  Bernarda agrees with current disposition to psych clear patient for discharge.  Patient is on schedule with his ACTT services and is scheduled for the next appointment later today with Alicia.    [place], [facility] [phone]

## 2024-01-12 NOTE — ED Notes (Signed)
 Tech went to stick patient, RN came over to assist, unable to obtain blood. Pt refusing to let tech/ this RN stick again for blood. Pt belongings removed, pt changed in to scrubs in presence of this RN, tech and security.

## 2024-01-12 NOTE — ED Notes (Signed)
 Pt given phone. Pt called neighbor Ezzard.

## 2024-01-12 NOTE — ED Notes (Signed)
 Meal provided

## 2024-01-21 ENCOUNTER — Encounter: Payer: Self-pay | Admitting: Emergency Medicine

## 2024-01-21 ENCOUNTER — Emergency Department
Admission: EM | Admit: 2024-01-21 | Discharge: 2024-01-22 | Disposition: A | Discharge status: Other Acute Inpatient Hospital | Payer: Medicare (Managed Care) | Attending: Emergency Medicine | Admitting: Emergency Medicine

## 2024-01-21 ENCOUNTER — Other Ambulatory Visit: Payer: Self-pay

## 2024-01-21 DIAGNOSIS — F250 Schizoaffective disorder, bipolar type: Secondary | ICD-10-CM | POA: Diagnosis not present

## 2024-01-21 DIAGNOSIS — F99 Mental disorder, not otherwise specified: Secondary | ICD-10-CM | POA: Diagnosis present

## 2024-01-21 DIAGNOSIS — F489 Nonpsychotic mental disorder, unspecified: Secondary | ICD-10-CM

## 2024-01-21 DIAGNOSIS — Z79899 Other long term (current) drug therapy: Secondary | ICD-10-CM | POA: Diagnosis not present

## 2024-01-21 LAB — COMPREHENSIVE METABOLIC PANEL WITH GFR
ALT: 9 U/L (ref 0–44)
AST: 17 U/L (ref 15–41)
Albumin: 4.4 g/dL (ref 3.5–5.0)
Alkaline Phosphatase: 68 U/L (ref 38–126)
Anion gap: 12 (ref 5–15)
BUN: 10 mg/dL (ref 6–20)
CO2: 24 mmol/L (ref 22–32)
Calcium: 9 mg/dL (ref 8.9–10.3)
Chloride: 101 mmol/L (ref 98–111)
Creatinine, Ser: 0.91 mg/dL (ref 0.61–1.24)
GFR, Estimated: >60 mL/min
Glucose, Bld: 93 mg/dL (ref 70–99)
Potassium: 3.5 mmol/L (ref 3.5–5.1)
Sodium: 137 mmol/L (ref 135–145)
Total Bilirubin: 0.3 mg/dL (ref 0.0–1.2)
Total Protein: 7.1 g/dL (ref 6.5–8.1)

## 2024-01-21 LAB — URINE DRUG SCREEN
Amphetamines: NEGATIVE
Barbiturates: NEGATIVE
Benzodiazepines: NEGATIVE
Cocaine: POSITIVE — AB
Fentanyl: NEGATIVE
Methadone Scn, Ur: NEGATIVE
Opiates: NEGATIVE
Tetrahydrocannabinol: POSITIVE — AB

## 2024-01-21 LAB — ETHANOL: Alcohol, Ethyl (B): <15 mg/dL

## 2024-01-21 LAB — CBC
HCT: 39.2 % (ref 39.0–52.0)
Hemoglobin: 13.3 g/dL (ref 13.0–17.0)
MCH: 29.3 pg (ref 26.0–34.0)
MCHC: 33.9 g/dL (ref 30.0–36.0)
MCV: 86.3 fL (ref 80.0–100.0)
Platelets: 354 K/uL (ref 150–400)
RBC: 4.54 MIL/uL (ref 4.22–5.81)
RDW: 13.7 % (ref 11.5–15.5)
WBC: 9.2 K/uL (ref 4.0–10.5)
nRBC: 0 % (ref 0.0–0.2)

## 2024-01-21 NOTE — ED Notes (Signed)
 Patient sleeping

## 2024-01-21 NOTE — Progress Notes (Signed)
 Patient has been accepted to [facility] for 01/22/24 pending EKG and vol consent. Patient was assigned to [facility]. Accepting physician is Dr. Millie Manners. Call report to [phone] Option 2 (Please leave voicemail). Representative was [employer]   ER Staff is aware of it: Olam, ER Secretary Dr.  Viviann, ER MD Leonor PEAK, Patient's Nurse   Address:  [address]                  [place], [place] [address]

## 2024-01-21 NOTE — BH Assessment (Signed)
 Comprehensive Clinical Assessment (CCA) Screening, Triage and Referral Note  01/21/2024 Kenneth Jensen [id number] Recommendations for Services/Supports/Treatments: Kenneth Jensen consult/Disposition pending. Lonni JONETTA. Gondek is a 34 year old, English speaking, Black male. Pt presented to [facility] ED Vol. Per triage note: Patient ambulatory to STAT desk, yelling loudly that he wants to go to [facility]; pt reports SI with no active plan   On assessment, the patient presented with disorganized thought processes. Pt was fixated on being sent to [facility] due to grievances with the people at his residence. Pt reported that he'd presented to the ED due to SI after being accused of being gay by peers. Pt reported that he fears people having keys to his home and walking in without permission. Pt is paranoid with bizarre behaviors such as laughing inappropriately. Pt began perseverating about wanting to relocate to [facility] and letting his guardian know that that's where he'll be if allowed. Pt did not confirm or deny substance use. Pt is connected to an ACT Team and has a legal guardian. Pt experiencing vague HI, with no identified person; however, pt alluded to wanting to stab someone with a pen. Pt presented with a depressed mood; affect was congruent. The patient denied current SI, and V/H.  Chief Complaint: No chief complaint on file.  Visit Diagnosis: Schizoaffective disorder, bipolar type.  Patient Reported Information How did you hear about us ? Self  What Is the Reason for Your Visit/Call Today? Patient ambulatory to STAT desk, yelling loudly that he wants to go to [facility];  pt reports SI with no active plan  How Long Has This Been Causing You Problems? <Week  What Do You Feel Would Help You the Most Today? Stress Management   Have You Recently Had Any Thoughts About Hurting Yourself? Yes  Are You Planning to Commit Suicide/Harm Yourself At This time? No   Have you  Recently Had Thoughts About Hurting Someone [name]? No  Are You Planning to Harm Someone at This Time? No  Explanation: Pt endorsed having vague SI without a plan.   Have You Used Any Alcohol or Drugs in the Past 24 Hours? -- (Unknown at this time.)  How Long Ago Did You Use Drugs or Alcohol? [facility]  What Did You Use and How Much? UDS positive for cocaine and THC   Do You Currently Have a Therapist/Psychiatrist? Yes  Name of Therapist/Psychiatrist: [facility]   Have You Been Recently Discharged From Any Office Practice or Programs? No  Explanation of Discharge From Practice/Program: No data recorded   CCA Screening Triage Referral Assessment Type of Contact: Face-to-Face  Telemedicine Service Delivery:   Is this Initial or Reassessment?   Date Telepsych consult ordered in [facility]:    Time Telepsych consult ordered in [facility]:    Location of Assessment: [facility] ED  Provider Location: [facility] ED    Collateral Involvement: Legal Guardian Vinie Roughen [phone]).   Does Patient Have a Automotive Engineer Guardian? No data recorded Name and Contact of Legal Guardian: No data recorded If Minor and Not Living with Parent(s), Who has Custody? N/A  Is CPS involved or ever been involved? Never  Is APS involved or ever been involved? Never   Patient Determined To Be At Risk for Harm To Self or Others Based on Review of Patient Reported Information or Presenting Complaint? No  Method: No Plan  Availability of Means: No access or NA  Intent: Vague intent or NA  Notification Required: No need or identified person  Additional Information  for Danger to Others Potential: Active psychosis  Additional Comments for Danger to Others Potential: n/a  Are There Guns or Other Weapons in Your Home? -- (UTA)  Types of Guns/Weapons: No access  Are These Weapons Safely Secured?                            -- (N/A)  Who Could Verify You Are Able To Have These Secured: n/a  Do You Have any  Outstanding Charges, Pending Court Dates, Parole/Probation? None reported  Contacted To Inform of Risk of Harm To Self or Others: -- (n/a)   Does Patient Present under Involuntary Commitment? No    [place] of Residence: [place]   Patient Currently Receiving the Following Services: ACTT Psychologist, Educational); Medication Management   Determination of Need: Emergent (2 hours)   Options For Referral: ED Visit   Disposition Recommendation per psychiatric provider: Pending psych consult.   Sabriya Yono R Oddie Bottger, LCAS

## 2024-01-21 NOTE — ED Notes (Signed)
 Transferred to [facility] via wheelchair by EDT and security. 3 bag of belongings sent to be locked in [place] locker room. Pt alert and oriented X4, cooperative, RR even and unlabored, color WNL. Pt in NAD.

## 2024-01-21 NOTE — ED Notes (Signed)
 With this nurse, LULLA Knee RN, EDT Randi and male engineer, materials, pt removes pack cigarettes, cell phone & charger, 2 lighters, black wallet, lanyard with set of keys, tan pants, red sweatpants, black socks, brown boots, blue t-shirt, grey t-shirt, orange back pack, grey sweatshirt, black head scarf, blue underwear--all placed in labeled pt belonging bag to be secured on nursing unit and pt changed into behav scrubs

## 2024-01-21 NOTE — ED Triage Notes (Signed)
 Patient ambulatory to STAT desk, yelling loudly that he wants to go to [facility];  pt reports SI with no active plan

## 2024-01-21 NOTE — ED Notes (Signed)
 Telepsych assessment currently in process.

## 2024-01-21 NOTE — ED Provider Notes (Signed)
 "  [facility] Provider Note    Event Date/Time   First MD Initiated Contact with Patient 01/21/24 [phone]     (approximate)   History   No chief complaint on file.   HPI  Kenneth Jensen is a 33 y.o. male   Past medical history of bipolar, depression, schizophrenia, here stating that he has a mental health crisis because people are calling me gay and wants to meet with a psychiatrist to go to [facility].    Denies SI HI AVH and states no drug or alcohol use for a few days.    No other acute medical complaints.  He came here voluntarily.   External Medical Documents Reviewed: Prior behavioral health notes      Physical Exam   Triage Vital Signs: ED Triage Vitals [01/21/24 0325]  Encounter Vitals Group     BP (!) 131/90     Girls Systolic BP Percentile      Girls Diastolic BP Percentile      Boys Systolic BP Percentile      Boys Diastolic BP Percentile      Pulse Rate 93     Resp 18     Temp 98.7 F (37.1 C)     Temp Source Oral     SpO2 98 %     Weight 200 lb (90.7 kg)     Height 5' 4 (1.626 m)     Head Circumference      Peak Flow      Pain Score      Pain Loc      Pain Education      Exclude from Growth Chart     Most recent vital signs: Vitals:   01/21/24 0325  BP: (!) 131/90  Pulse: 93  Resp: 18  Temp: 98.7 F (37.1 C)  SpO2: 98%    General: Awake, no distress.  CV:  Good peripheral perfusion.  Resp:  Normal effort. Abd:  No distention.  Other:  Staring off in space but answering my questions appropriately.  Vital signs normal except mildly high blood pressure.  Lungs clear breathing comfortably on room air no evidence of trauma on external visual exam.   ED Results / Procedures / Treatments   Labs (all labs ordered are listed, but only abnormal results are displayed) Labs Reviewed  COMPREHENSIVE METABOLIC PANEL WITH GFR  ETHANOL  CBC  URINE DRUG SCREEN     I ordered and reviewed the above labs  they are notable for cell counts and electrolytes unremarkable.  PROCEDURES:  Critical Care performed: No  Procedures   MEDICATIONS ORDERED IN ED: Medications - No data to display  IMPRESSION / MDM / ASSESSMENT AND PLAN / ED COURSE  I reviewed the triage vital signs and the nursing notes.                                Patient's presentation is most consistent with acute presentation with potential threat to life or bodily function.  Differential diagnosis includes, but is not limited to, acute decompensated psychiatric illness, substance-induced mood disorder, considered organic causes like trauma or infection but less likely    MDM:    Here requesting mental health evaluation.  Nonspecific complaints.  No acute medical complaints.  Doubt organic cause like infection or trauma.  Here voluntarily and despite bizarre behavior is not telling me any SI HI AVH and is actually  quite appropriate answering my questions and so I see no rationale for IVC at this time and instead will place him under voluntary status, medically cleared, for psychiatric evaluation       FINAL CLINICAL IMPRESSION(S) / ED DIAGNOSES   Final diagnoses:  Mental health problem     Rx / DC Orders   ED Discharge Orders     None        Note:  This document was prepared using Dragon voice recognition software and may include unintentional dictation errors.    Cyrena Mylar, MD 01/21/24 [phone]  "

## 2024-01-21 NOTE — ED Notes (Signed)
 Meal provided

## 2024-01-21 NOTE — ED Notes (Signed)
 Patient given snack at the bedside. No acute needs at this time.

## 2024-01-21 NOTE — ED Notes (Signed)
 VOL  GOING TO [place] ON 01/22/23

## 2024-01-21 NOTE — Progress Notes (Signed)
 Kenneth Jensen at [facility] requested legal guardian's email address to send voluntary consent form for signature.  [facility] spoke to Kenneth Jensen at [employer].  [facility] updated [name]) w/ email for [email or web address] and to copy [email or web address].  Kenneth Jensen will email forms and update once receive signed vol form.  [place], [facility] [phone]

## 2024-01-21 NOTE — ED Notes (Signed)
 Dr Cyrena to triage to assess pt

## 2024-01-21 NOTE — ED Notes (Signed)
Lunch tray provided for pt

## 2024-01-21 NOTE — ED Notes (Signed)
 This RN gave report to Eleni Gal, CHARITY FUNDRAISER at [facility].

## 2024-01-21 NOTE — ED Notes (Signed)
 Paper and crayon was given

## 2024-01-21 NOTE — Progress Notes (Addendum)
 Per AC Noberto), the bicu unit is at capacity.  Patient was referred to the following facilities:  Service Provider Phone  [facility]  [phone]  [facility]  [phone]  [facility]  [phone]  [facility]  [phone]  [facility]  [phone]  [facility]  [phone]  [facility]  [phone]  [facility]  [phone]  [facility]  [phone]  [facility]  [phone]  [facility]  [phone]  [facility]  [phone]  [facility]  [phone]  [facility]  [address], [place] [phone]

## 2024-01-21 NOTE — ED Notes (Signed)
VOL/Psych Consult Ordered/Pending

## 2024-01-21 NOTE — Progress Notes (Signed)
 Ebony at [facility] stated there is no bed availability for today.  Elenor at [facility] is currently reviewing pt for possible placement.  [place], [facility] [phone]

## 2024-01-21 NOTE — ED Notes (Signed)
 Pt ABCs intact. RR even and unlabored. Pt in NAD.  Denies needs at this time.    Past Medical History:  Diagnosis Date   Asthma    Bipolar 1 disorder (HCC)    Depression    Schizophrenia (HCC)    Schizotaxia

## 2024-01-21 NOTE — Consult Note (Signed)
 [facility] Consult Note  Patient Name: Kenneth Jensen MRN: [id number] DOB: Apr 07, 1990 DATE OF Consult: 01/21/2024 Consult Order details:  Orders (From admission, onward)     Start     Ordered   01/21/24 0340  CONSULT TO CALL ACT TEAM       Ordering Provider: Cyrena Mylar, MD  Provider:  (Not yet assigned)  Question:  Reason for Consult?  Answer:  Psych consult   01/21/24 0339   01/21/24 0340  IP CONSULT TO PSYCHIATRY       Ordering Provider: Cyrena Mylar, MD  Provider:  (Not yet assigned)  Question:  Reason for consult:  Answer:  Medication management   01/21/24 0339            PRIMARY PSYCHIATRIC DIAGNOSES  1.  Schizoaffective disorder bipolar type 2.  Rule out substance-induced psychosis 3.  Rule out substance-induced mood disorder  RECOMMENDATIONS  Recommendations: Medication recommendations: Zyprexa  10 mg p.o. twice daily as needed for agitation-further med management deferred to inpatient psych Non-Medication/therapeutic recommendations: Supportive care Is inpatient psychiatric hospitalization recommended for this patient? Yes (Explain why): Suicidal and  homicidal ideation, hallucination Is another care setting recommended for this patient? (examples may include Crisis Stabilization Unit, Residential/Recovery Treatment, ALF/SNF, Memory Care Unit)  No (Explain why): Suicidal and  homicidal ideation, hallucination From a psychiatric perspective, is this patient appropriate for discharge to an outpatient setting/resource or other less restrictive environment for continued care?  No (Explain why): Suicidal and  homicidal ideation, hallucination Follow-Up Telepsychiatry C/L services: We will sign off for now. Please re-consult our service if needed for any concerning changes in the patient's condition, discharge planning, or questions. Communication: Treatment team members (and family members if applicable) who were involved in treatment/care discussions and planning,  and with whom we spoke or engaged with via secure text/chat, include the following: Kenneth Peyton GRADE, RN  I personally spent a total of 35 minutes in the care of the patient today including preparing to see the patient, getting/reviewing separately obtained history, counseling and educating, documenting clinical information in the EHR, and coordinating care.  Thank you for involving us  in the care of this patient. If you have any additional questions or concerns, please call [phone] and ask for me or the provider on-call.  TELEPSYCHIATRY ATTESTATION & CONSENT  As the provider for this telehealth consult, I attest that I verified the patients identity using two separate identifiers, introduced myself to the patient, provided my credentials, disclosed my location, and performed this encounter via a HIPAA-compliant, real-time, face-to-face, two-way, interactive audio and video platform and with the full consent and agreement of the patient (or guardian as applicable.)  Patient physical location: [place]. Telehealth provider physical location: home office in state of [place].  Video start time: 0825 AM (Central Time) Video end time: 0845 AM (Central Time)  IDENTIFYING DATA  Kenneth Jensen is a 34 y.o. year-old male for whom a psychiatric consultation has been ordered by the primary provider. The patient was identified using two separate identifiers.  CHIEF COMPLAINT/REASON FOR CONSULT  Suicidal and homicidal ideation, hallucination  HISTORY OF PRESENT ILLNESS (HPI)  Psych consult requested for evaluation of 34 year old male with history of bipolar disorder versus schizophrenia presented to the ED on 1/15, endorsed suicidal ideation, homicidal ideation no identified target because people are calling him  gay, , unclear plan or intent, requesting to go to [facility].  Patient has ACT team and the legal guardian.  During evaluation today, patient is alert  oriented, not interested in interview.   Endorses suicidal ideation, reports he does not want to divulge the plans.  Complains of hearing voices telling him he is not going to make it.  Admits to cocaine use, last use was 3 days ago.  Reports his ACT will not give him his medications for the past 2 days, unable to recall the name of the medications.  PAST PSYCHIATRIC HISTORY   Otherwise as per HPI above.  PAST MEDICAL HISTORY  Past Medical History:  Diagnosis Date   Asthma    Bipolar 1 disorder (HCC)    Depression    Schizophrenia (HCC)    Schizotaxia      HOME MEDICATIONS  PTA Medications  Medication Sig   paliperidone  (INVEGA  SUSTENNA) 234 MG/1.5ML injection Inject 234 mg into the muscle every 28 (twenty-eight) days.   divalproex  (DEPAKOTE  ER) 500 MG 24 hr tablet Take 2 tablets (1,000 mg total) by mouth at bedtime for 14 days.   benztropine  (COGENTIN ) 0.5 MG tablet Take 0.5 mg by mouth daily.   QUEtiapine  (SEROQUEL ) 200 MG tablet Take 200 mg by mouth at bedtime.   paliperidone  (INVEGA ) 9 MG 24 hr tablet Take 9 mg by mouth daily.   fluPHENAZine (PROLIXIN) 5 MG tablet Take 5 mg by mouth 2 (two) times daily.   fluPHENAZine decanoate (PROLIXIN) 25 MG/ML injection    prazosin  (MINIPRESS ) 1 MG capsule Take 1 capsule (1 mg total) by mouth at bedtime. (Patient not taking: Reported on 01/12/2024)   nicotine  polacrilex (NICORETTE ) 2 MG gum Take 1 each (2 mg total) by mouth as needed for smoking cessation. (Patient not taking: Reported on 01/12/2024)   traZODone  (DESYREL ) 50 MG tablet Take 1 tablet (50 mg total) by mouth at bedtime as needed for up to 14 days for sleep.   paliperidone  (INVEGA ) 3 MG 24 hr tablet Take 3 mg by mouth at bedtime. (Patient not taking: Reported on 11/15/2023)   haloperidol  (HALDOL ) 10 MG tablet Take 10 mg by mouth 3 (three) times daily. (Patient not taking: Reported on 07/14/2023)   ondansetron  (ZOFRAN -ODT) 8 MG disintegrating tablet Take 1 tablet (8 mg total) by mouth every 8 (eight) hours as needed for nausea or  vomiting. (Patient not taking: Reported on 01/12/2024)     ALLERGIES  Allergies[1]  SOCIAL & SUBSTANCE USE HISTORY  Social History   Socioeconomic History   Marital status: Single    Spouse name: Not on file   Number of children: Not on file   Years of education: Not on file   Highest education level: Not on file  Occupational History   Not on file  Tobacco Use   Smoking status: Every Day    Current packs/day: 0.50    Types: Cigarettes   Smokeless tobacco: Never  Substance and Sexual Activity   Alcohol use: Yes    Comment: social   Drug use: Yes    Types: Marijuana, Cocaine    Comment: last marijuana use 09/02/22   Sexual activity: Not Currently  Other Topics Concern   Not on file  Social History Narrative   ** Merged History Encounter **       Social Drivers of Health   Tobacco Use: High Risk (01/21/2024)   Patient History    Smoking Tobacco Use: Every Day    Smokeless Tobacco Use: Never    Passive Exposure: Not on file  Financial Resource Strain: Not on file  Food Insecurity: Food Insecurity Present (09/04/2022)   Hunger Vital Sign  Worried About Programme Researcher, Broadcasting/film/video in the Last Year: Sometimes true    Ran Out of Food in the Last Year: Sometimes true  Transportation Needs: Unmet Transportation Needs (09/04/2022)   PRAPARE - Administrator, Civil Service (Medical): Yes    Lack of Transportation (Non-Medical): Yes  Physical Activity: Not on file  Stress: Not on file  Social Connections: Not on file  Depression (PHQ2-9): Not on file  Alcohol Screen: Low Risk (09/03/2022)   Alcohol Screen    Last Alcohol Screening Score (AUDIT): 1  Housing: High Risk (09/04/2022)   Housing    Last Housing Risk Score: 2  Utilities: Not At Risk (09/04/2022)   AHC Utilities    Threatened with loss of utilities: No  Health Literacy: Not on file   Tobacco Use History[2] Social History   Substance and Sexual Activity  Alcohol Use Yes   Comment: social   Social  History   Substance and Sexual Activity  Drug Use Yes   Types: Marijuana, Cocaine   Comment: last marijuana use 09/02/22    Additional pertinent information .  FAMILY HISTORY  History reviewed. No pertinent family history. Family Psychiatric History (if known):    MENTAL STATUS EXAM (MSE)  Mental Status Exam: General Appearance: fairly groomed  Orientation:  Full (Time, Place, and Person)  Memory:  Immediate;   Fair Recent;   Fair Remote;   Fair  Concentration:  Concentration: Fair and Attention Span: Fair  Recall:  Fair  Attention  Fair  Eye Contact:  Poor  Speech:  Normal Rate  Language:  Fair  Volume:  Normal  Mood: irritable  Affect:  Constricted  Thought Process:  Coherent  Thought Content:  Hallucinations: Auditory  Suicidal Thoughts:  Yes.  without intent/plan  Homicidal Thoughts:  Yes.  without intent/plan  Judgement:  Poor  Insight:  limited  Psychomotor Activity:  Normal  Akathisia:  No  Fund of Knowledge:  Fair    Assets:  Others:  has ACT  Cognition:  WNL  ADL's:  Intact  AIMS (if indicated):       VITALS  Blood pressure (!) 131/90, pulse 93, temperature 98.7 F (37.1 C), temperature source Oral, resp. rate 18, height 5' 4 (1.626 m), weight 90.7 kg, SpO2 98%.  LABS  Admission on 01/21/2024  Component Date Value Ref Range Status   Sodium 01/21/2024 137  135 - 145 mmol/L Final   Potassium 01/21/2024 3.5  3.5 - 5.1 mmol/L Final   Chloride 01/21/2024 101  98 - 111 mmol/L Final   CO2 01/21/2024 24  22 - 32 mmol/L Final   Glucose, Bld 01/21/2024 93  70 - 99 mg/dL Final   Glucose reference range applies only to samples taken after fasting for at least 8 hours.   BUN 01/21/2024 10  6 - 20 mg/dL Final   Creatinine, Ser 01/21/2024 0.91  0.61 - 1.24 mg/dL Final   Calcium 98/84/7973 9.0  8.9 - 10.3 mg/dL Final   Total Protein 98/84/7973 7.1  6.5 - 8.1 g/dL Final   Albumin 98/84/7973 4.4  3.5 - 5.0 g/dL Final   AST 98/84/7973 17  15 - 41 U/L Final   ALT  01/21/2024 9  0 - 44 U/L Final   Alkaline Phosphatase 01/21/2024 68  38 - 126 U/L Final   Total Bilirubin 01/21/2024 0.3  0.0 - 1.2 mg/dL Final   GFR, Estimated 01/21/2024 >60  >60 mL/min Final   Comment: (NOTE) Calculated using the CKD-EPI  Creatinine Equation (2021)    Anion gap 01/21/2024 12  5 - 15 Final   Performed at [facility], [address] [place], [place] [address]   Alcohol, Ethyl (B) 01/21/2024 <15  <15 mg/dL Final   Comment: (NOTE) For medical purposes only. Performed at [facility], [address] [place], [place] [address]    WBC 01/21/2024 9.2  4.0 - 10.5 K/uL Final   RBC 01/21/2024 4.54  4.22 - 5.81 MIL/uL Final   Hemoglobin 01/21/2024 13.3  13.0 - 17.0 g/dL Final   HCT 98/84/7973 39.2  39.0 - 52.0 % Final   MCV 01/21/2024 86.3  80.0 - 100.0 fL Final   MCH 01/21/2024 29.3  26.0 - 34.0 pg Final   MCHC 01/21/2024 33.9  30.0 - 36.0 g/dL Final   RDW 98/84/7973 13.7  11.5 - 15.5 % Final   Platelets 01/21/2024 354  150 - 400 K/uL Final   nRBC 01/21/2024 0.0  0.0 - 0.2 % Final   Performed at [facility], [address] [place], [place] [address]    PSYCHIATRIC REVIEW OF SYSTEMS (ROS)  ROS: Notable for the following relevant positive findings: ROS  Additional findings:      Musculoskeletal: No abnormal movements observed      Gait & Station: Laying/Sitting      Pain Screening: Denies      Nutrition & Dental Concerns: none  RISK FORMULATION/ASSESSMENT  Is the patient experiencing any suicidal or homicidal ideations: Yes       Explain if yes: see HPI Protective factors considered for safety management: In hospital, constant observation  Risk factors/concerns considered for safety management:  Depression Substance abuse/dependence Impulsivity Male gender  Is there a safety management plan with the patient and treatment team to minimize risk factors and promote protective factors: Yes           Explain: inpatient psychiatric  stabilization  Is crisis care placement or psychiatric hospitalization recommended: Yes     Based on my current evaluation and risk assessment, patient is determined at this time to be at:  Moderate Risk  *RISK ASSESSMENT Risk assessment is a dynamic process; it is possible that this patient's condition, and risk level, may change. This should be re-evaluated and managed over time as appropriate. Please re-consult psychiatric consult services if additional assistance is needed in terms of risk assessment and management. If your team decides to discharge this patient, please advise the patient how to best access emergency psychiatric services, or to call 911, if their condition worsens or they feel unsafe in any way.   Kirbi Farrugia D Betsi Crespi, MD Telepsychiatry Consult Services    [1]  Allergies Allergen Reactions   Penicillins Anaphylaxis   Amoxicillin Swelling   Percocet [Oxycodone-Acetaminophen ]    Vicodin [Hydrocodone-Acetaminophen ] Itching   Vicodin [Hydrocodone-Acetaminophen ]   [2]  Social History Tobacco Use  Smoking Status Every Day   Current packs/day: 0.50   Types: Cigarettes  Smokeless Tobacco Never

## 2024-01-21 NOTE — Progress Notes (Signed)
 [employer]) reported that pt's legal guardian has changed from [name] to [employer] [phone]).  [facility] will request update in chart.  Margaret confirmed that she has completed voluntary consent form and sent back to [facility].  Receipt confirmed by [facility]en[facility] - Intake)  Michial Skeen, [facility] [phone]

## 2024-01-22 NOTE — ED Notes (Signed)
 Breakfast has been given and a drink.

## 2024-01-22 NOTE — ED Provider Notes (Signed)
 Emergency Medicine Observation Re-evaluation Note  Kenneth Jensen is a 34 y.o. male, seen on rounds today.  Pt initially presented to the ED for complaints of No chief complaint on file. Currently, the patient is resting.  Physical Exam  BP 127/80   Pulse 83   Temp 98.6 F (37 C) (Oral)   Resp 16   Ht 5' 4 (1.626 m)   Wt 90.7 kg   SpO2 100%   BMI 34.33 kg/m  Physical Exam .Gen:  No acute distress Resp:  Breathing easily and comfortably, no accessory muscle usage Neuro:  Moving all four extremities, no gross focal neuro deficits Psych:  Resting currently, calm when awake   ED Course / MDM  EKG:   I have reviewed the labs performed to date as well as medications administered while in observation.  Recent changes in the last 24 hours include no acute events.  Plan  Current plan is for psyc dispo.    Waymond Lorelle Cummins, MD 01/22/24 [phone]

## 2024-01-22 NOTE — ED Notes (Signed)
 VOL/Going to [facility] this am
